# Patient Record
Sex: Female | Born: 1935 | Race: White | Hispanic: No | Smoking: Never smoker
Health system: Southern US, Community
[De-identification: ages and names within clinical notes are randomized; demographics above are authoritative.]

## PROBLEM LIST (undated history)

## (undated) DIAGNOSIS — I959 Hypotension, unspecified: Secondary | ICD-10-CM

## (undated) DIAGNOSIS — F32A Depression, unspecified: Secondary | ICD-10-CM

## (undated) DIAGNOSIS — E7800 Pure hypercholesterolemia, unspecified: Secondary | ICD-10-CM

## (undated) DIAGNOSIS — D649 Anemia, unspecified: Secondary | ICD-10-CM

## (undated) DIAGNOSIS — E785 Hyperlipidemia, unspecified: Secondary | ICD-10-CM

## (undated) DIAGNOSIS — M1990 Unspecified osteoarthritis, unspecified site: Secondary | ICD-10-CM

## (undated) DIAGNOSIS — E039 Hypothyroidism, unspecified: Secondary | ICD-10-CM

## (undated) DIAGNOSIS — M51369 Other intervertebral disc degeneration, lumbar region without mention of lumbar back pain or lower extremity pain: Secondary | ICD-10-CM

## (undated) DIAGNOSIS — I700 Atherosclerosis of aorta: Secondary | ICD-10-CM

## (undated) DIAGNOSIS — E538 Deficiency of other specified B group vitamins: Secondary | ICD-10-CM

## (undated) DIAGNOSIS — I10 Essential (primary) hypertension: Secondary | ICD-10-CM

## (undated) DIAGNOSIS — I499 Cardiac arrhythmia, unspecified: Secondary | ICD-10-CM

## (undated) DIAGNOSIS — R911 Solitary pulmonary nodule: Secondary | ICD-10-CM

## (undated) DIAGNOSIS — Z9889 Other specified postprocedural states: Secondary | ICD-10-CM

## (undated) DIAGNOSIS — E119 Type 2 diabetes mellitus without complications: Secondary | ICD-10-CM

## (undated) DIAGNOSIS — R011 Cardiac murmur, unspecified: Secondary | ICD-10-CM

## (undated) DIAGNOSIS — F329 Major depressive disorder, single episode, unspecified: Secondary | ICD-10-CM

## (undated) DIAGNOSIS — I2510 Atherosclerotic heart disease of native coronary artery without angina pectoris: Secondary | ICD-10-CM

## (undated) DIAGNOSIS — R3915 Urgency of urination: Secondary | ICD-10-CM

## (undated) DIAGNOSIS — K219 Gastro-esophageal reflux disease without esophagitis: Secondary | ICD-10-CM

## (undated) DIAGNOSIS — G4730 Sleep apnea, unspecified: Secondary | ICD-10-CM

## (undated) DIAGNOSIS — J189 Pneumonia, unspecified organism: Secondary | ICD-10-CM

## (undated) DIAGNOSIS — J45909 Unspecified asthma, uncomplicated: Secondary | ICD-10-CM

## (undated) DIAGNOSIS — M069 Rheumatoid arthritis, unspecified: Secondary | ICD-10-CM

## (undated) DIAGNOSIS — E11319 Type 2 diabetes mellitus with unspecified diabetic retinopathy without macular edema: Secondary | ICD-10-CM

## (undated) DIAGNOSIS — M5136 Other intervertebral disc degeneration, lumbar region: Secondary | ICD-10-CM

## (undated) DIAGNOSIS — A498 Other bacterial infections of unspecified site: Secondary | ICD-10-CM

## (undated) DIAGNOSIS — R112 Nausea with vomiting, unspecified: Secondary | ICD-10-CM

## (undated) DIAGNOSIS — M4800 Spinal stenosis, site unspecified: Secondary | ICD-10-CM

## (undated) HISTORY — PX: VARICOSE VEIN SURGERY: SHX832

## (undated) HISTORY — PX: KNEE ARTHROSCOPY W/ AUTOGENOUS CARTILAGE IMPLANTATION (ACI) PROCEDURE: SHX1865

## (undated) HISTORY — DX: Essential (primary) hypertension: I10

## (undated) HISTORY — PX: CYSTOSCOPY: SUR368

## (undated) HISTORY — PX: CATARACT EXTRACTION, BILATERAL: SHX1313

## (undated) HISTORY — DX: Major depressive disorder, single episode, unspecified: F32.9

## (undated) HISTORY — DX: Type 2 diabetes mellitus without complications: E11.9

## (undated) HISTORY — DX: Depression, unspecified: F32.A

## (undated) HISTORY — DX: Gastro-esophageal reflux disease without esophagitis: K21.9

## (undated) HISTORY — DX: Sleep apnea, unspecified: G47.30

## (undated) HISTORY — PX: JOINT REPLACEMENT: SHX530

## (undated) HISTORY — DX: Unspecified osteoarthritis, unspecified site: M19.90

## (undated) HISTORY — DX: Cardiac murmur, unspecified: R01.1

## (undated) HISTORY — DX: Hyperlipidemia, unspecified: E78.5

## (undated) HISTORY — PX: ABDOMINAL HYSTERECTOMY: SHX81

## (undated) HISTORY — PX: VEIN SURGERY: SHX48

---

## 2004-07-15 ENCOUNTER — Other Ambulatory Visit: Payer: Self-pay

## 2004-07-15 ENCOUNTER — Emergency Department: Payer: Self-pay | Admitting: Internal Medicine

## 2007-09-26 ENCOUNTER — Ambulatory Visit: Payer: Self-pay | Admitting: Gastroenterology

## 2008-09-29 ENCOUNTER — Emergency Department: Payer: Self-pay | Admitting: Internal Medicine

## 2010-03-04 ENCOUNTER — Ambulatory Visit: Payer: Self-pay | Admitting: Gastroenterology

## 2010-03-06 LAB — PATHOLOGY REPORT

## 2010-10-15 ENCOUNTER — Ambulatory Visit: Payer: Self-pay | Admitting: Family Medicine

## 2011-07-07 ENCOUNTER — Ambulatory Visit: Payer: Self-pay | Admitting: Internal Medicine

## 2011-08-27 ENCOUNTER — Other Ambulatory Visit: Payer: Self-pay | Admitting: Unknown Physician Specialty

## 2011-10-07 ENCOUNTER — Other Ambulatory Visit: Payer: Self-pay | Admitting: Unknown Physician Specialty

## 2011-10-20 ENCOUNTER — Ambulatory Visit: Payer: Self-pay | Admitting: Unknown Physician Specialty

## 2011-12-28 ENCOUNTER — Ambulatory Visit: Payer: Self-pay | Admitting: Family Medicine

## 2012-01-27 ENCOUNTER — Ambulatory Visit: Payer: Self-pay | Admitting: Family Medicine

## 2012-08-23 HISTORY — PX: TOTAL KNEE ARTHROPLASTY: SHX125

## 2013-02-08 DIAGNOSIS — M5416 Radiculopathy, lumbar region: Secondary | ICD-10-CM | POA: Insufficient documentation

## 2013-02-16 ENCOUNTER — Ambulatory Visit: Payer: Self-pay | Admitting: Family Medicine

## 2013-03-22 ENCOUNTER — Ambulatory Visit: Payer: Self-pay | Admitting: Family Medicine

## 2013-07-14 ENCOUNTER — Ambulatory Visit: Payer: Self-pay | Admitting: Family Medicine

## 2013-12-29 ENCOUNTER — Observation Stay: Payer: Self-pay

## 2013-12-29 LAB — COMPREHENSIVE METABOLIC PANEL
ALK PHOS: 131 U/L — AB
Albumin: 3.5 g/dL (ref 3.4–5.0)
Anion Gap: 4 — ABNORMAL LOW (ref 7–16)
BILIRUBIN TOTAL: 0.3 mg/dL (ref 0.2–1.0)
BUN: 18 mg/dL (ref 7–18)
CALCIUM: 9.2 mg/dL (ref 8.5–10.1)
CO2: 34 mmol/L — AB (ref 21–32)
Chloride: 101 mmol/L (ref 98–107)
Creatinine: 0.73 mg/dL (ref 0.60–1.30)
EGFR (African American): >60
EGFR (Non-African Amer.): >60
GLUCOSE: 154 mg/dL — AB (ref 65–99)
Osmolality: 283 (ref 275–301)
POTASSIUM: 3.6 mmol/L (ref 3.5–5.1)
SGOT(AST): 28 U/L (ref 15–37)
SGPT (ALT): 13 U/L (ref 12–78)
Sodium: 139 mmol/L (ref 136–145)
TOTAL PROTEIN: 7.4 g/dL (ref 6.4–8.2)

## 2013-12-29 LAB — URINALYSIS, COMPLETE
BACTERIA: NONE SEEN
Bilirubin,UR: NEGATIVE
Blood: NEGATIVE
Glucose,UR: NEGATIVE mg/dL (ref 0–75)
Ketone: NEGATIVE
Nitrite: NEGATIVE
Ph: 6 (ref 4.5–8.0)
Protein: NEGATIVE
Specific Gravity: 1.005 (ref 1.003–1.030)
Squamous Epithelial: 1
WBC UR: 3 /HPF (ref 0–5)

## 2013-12-29 LAB — CBC
HCT: 38.2 % (ref 35.0–47.0)
HGB: 12.1 g/dL (ref 12.0–16.0)
MCH: 29.3 pg (ref 26.0–34.0)
MCHC: 31.7 g/dL — AB (ref 32.0–36.0)
MCV: 92 fL (ref 80–100)
PLATELETS: 137 10*3/uL — AB (ref 150–440)
RBC: 4.14 10*6/uL (ref 3.80–5.20)
RDW: 14.8 % — ABNORMAL HIGH (ref 11.5–14.5)
WBC: 5.4 10*3/uL (ref 3.6–11.0)

## 2013-12-29 LAB — TROPONIN I: Troponin-I: <0.02 ng/mL

## 2013-12-29 LAB — CK TOTAL AND CKMB (NOT AT ARMC)
CK, TOTAL: 83 U/L
CK-MB: 1 ng/mL (ref 0.5–3.6)

## 2013-12-30 LAB — CBC WITH DIFFERENTIAL/PLATELET
Basophil #: 0 10*3/uL (ref 0.0–0.1)
Basophil %: 0.6 %
EOS ABS: 0.3 10*3/uL (ref 0.0–0.7)
Eosinophil %: 6.5 %
HCT: 37.4 % (ref 35.0–47.0)
HGB: 12.1 g/dL (ref 12.0–16.0)
Lymphocyte #: 1.4 10*3/uL (ref 1.0–3.6)
Lymphocyte %: 30.9 %
MCH: 29.6 pg (ref 26.0–34.0)
MCHC: 32.2 g/dL (ref 32.0–36.0)
MCV: 92 fL (ref 80–100)
MONO ABS: 0.4 x10 3/mm (ref 0.2–0.9)
MONOS PCT: 9.4 %
NEUTROS ABS: 2.4 10*3/uL (ref 1.4–6.5)
Neutrophil %: 52.6 %
PLATELETS: 129 10*3/uL — AB (ref 150–440)
RBC: 4.07 10*6/uL (ref 3.80–5.20)
RDW: 15.1 % — ABNORMAL HIGH (ref 11.5–14.5)
WBC: 4.6 10*3/uL (ref 3.6–11.0)

## 2013-12-30 LAB — LIPID PANEL
Cholesterol: 159 mg/dL (ref 0–200)
HDL Cholesterol: 42 mg/dL (ref 40–60)
Ldl Cholesterol, Calc: 90 mg/dL (ref 0–100)
TRIGLYCERIDES: 137 mg/dL (ref 0–200)
VLDL Cholesterol, Calc: 27 mg/dL (ref 5–40)

## 2013-12-30 LAB — BASIC METABOLIC PANEL
ANION GAP: 4 — AB (ref 7–16)
BUN: 11 mg/dL (ref 7–18)
CO2: 31 mmol/L (ref 21–32)
Calcium, Total: 8.6 mg/dL (ref 8.5–10.1)
Chloride: 105 mmol/L (ref 98–107)
Creatinine: 0.7 mg/dL (ref 0.60–1.30)
EGFR (Non-African Amer.): >60
Glucose: 121 mg/dL — ABNORMAL HIGH (ref 65–99)
Osmolality: 280 (ref 275–301)
POTASSIUM: 3.6 mmol/L (ref 3.5–5.1)
Sodium: 140 mmol/L (ref 136–145)

## 2014-06-20 ENCOUNTER — Ambulatory Visit: Payer: Self-pay | Admitting: Family Medicine

## 2014-06-28 DIAGNOSIS — N3946 Mixed incontinence: Secondary | ICD-10-CM | POA: Insufficient documentation

## 2014-11-14 ENCOUNTER — Ambulatory Visit: Payer: Self-pay | Admitting: Family Medicine

## 2014-12-14 NOTE — Consult Note (Signed)
Present Illness The patient is a 80 year old female with a history of hypertension who presented today to the Emergency Department two days ago status post fall. She had been in her usual state of health until 2 days prior to admission. She had a fall at home and recalls she fell onto her right side, suffered no injury, suffered no loss of consciousness, was able to get up and resume normal activities and then a second episode occured, she was feeling dizzy. Subsequently during the night, she recalls going into the kitchen and then had loss of consciousness. It is not known how long she had the loss of consciousness. She tells me her sisters found her in the kitchen on the ground and called EMS. She awoke with EMS around her. She does not recall having any preceding chest pain or palpitations. She was then brought to the ED. Her initial vital signs were normal. An EKG showed sinus bradycardia with a rate of 55, but otherwise no acute changes. She underwent CT of the head which showed no acute intracranial abnormality, however, likely remote small lacunar infarcts were seen. Additionally, she underwent a CT head and neck with angiography, which showed irregular stenosis of 70% at the right internal carotid artery and 30 to 40% stenosis at the left external carotid artery. Imaging of the head showed no occlusion. There was atheromatous disease within cavernous segments of the internal carotid arteries bilaterally.   She specifically denies any episodes of right sided AF no symptoms consistent with right hemispheric TIA's She denies any knowledge of prior stroke  PAST MEDICAL HISTORY:  1.  Hypertension.  2.  Diabetes mellitus.  3.  Peptic ulcer disease.  4.  Seasonal allergies.  5.  Depression/anxiety.  6.  Hypothyroidism.  7.  A history of 7-mm right middle lobe pulmonary nodule, left image 09/2013.  8.  Chronic back pain attributed to spinal stenosis.   PAST SURGICAL HISTORY:  1.  Left vein surgery.   2.  Hysterectomy.  3.  Right knee replacement.   Home Medications: Medication Instructions Status  Aspir 81 81 mg oral tablet 1 tab(s) orally once a day Active  carvedilol 6.25 mg oral tablet 1 tab(s) orally 2 times a day Active  Neurontin 300 mg oral capsule  orally once daily as needed Active  Ambien 10 mg oral tablet 1 tab(s) orally once a day (at bedtime) as needed Active  amLODIPine 10 mg oral tablet 1 tab(s) orally once a day Active  cyanocobalamin 1000 mcg oral tablet 1 tab(s) orally once a day Active  hydrochlorothiazide 25 mg oral tablet 1 tab(s) orally once a day Active  levothyroxine 75 mcg (0.075 mg) oral tablet 1 tab(s) orally once a day Active  losartan 100 mg oral tablet 1 tab(s) orally once a day Active  omeprazole 20 mg oral delayed release capsule 1 cap(s) orally once a day Active  PARoxetine 30 mg oral tablet 1 tab(s) orally once a day Active  rosuvastatin 5 mg oral tablet 1 tab(s) orally once a day (at bedtime) Active  vitamin E 400 intl units oral capsule 1 cap(s) orally once a day Active    Sulfa drugs: Rash  Keflex: Rash  Case History:  Family History Non-Contributory   Social History negative tobacco, negative ETOH, negative Illicit drugs   Review of Systems:  Fever/Chills No   Cough No   Sputum No   Abdominal Pain No   Diarrhea No   Constipation No   Nausea/Vomiting No  SOB/DOE No   Chest Pain No   Telemetry Reviewed NSR   Dysuria No   Physical Exam:  GEN well developed, no acute distress   HEENT PERRL, hearing intact to voice, moist oral mucosa   NECK supple  trachea midline   RESP normal resp effort  no use of accessory muscles   CARD regular rate  LE edema present  no JVD   ABD denies tenderness  soft   EXTR negative cyanosis/clubbing, positive edema, mild pedal edema left >right, scattered diffuse varicosities again Lt>Rt   SKIN No rashes, No ulcers   NEURO cranial nerves intact, follows commands, motor/sensory  function intact   PSYCH alert, good insight   Nursing/Ancillary Notes: **Vital Signs.:   10-May-15 12:04  Vital Signs Type Routine  Temperature Temperature (F) 98.2  Celsius 36.7  Pulse Pulse 53  Respirations Respirations 20  Systolic BP Systolic BP 130  Diastolic BP (mmHg) Diastolic BP (mmHg) 63  Mean BP 91  Pulse Ox % Pulse Ox % 94  Pulse Ox Activity Level  At rest  Oxygen Delivery Room Air/ 21 %   Hepatic:  09-May-15 00:18   Bilirubin, Total 0.3  Alkaline Phosphatase  131 (45-117 NOTE: New Reference Range 07/13/13)  SGPT (ALT) 13  SGOT (AST) 28  Total Protein, Serum 7.4  Albumin, Serum 3.5  Routine Chem:  09-May-15 00:18   Glucose, Serum  154  BUN 18  Creatinine (comp) 0.73  Sodium, Serum 139  Potassium, Serum 3.6  Chloride, Serum 101  CO2, Serum  34  Calcium (Total), Serum 9.2  Anion Gap  4  Osmolality (calc) 283  eGFR (African American) >60  eGFR (Non-African American) >60 (eGFR values <11m/min/1.73 m2 may be an indication of chronic kidney disease (CKD). Calculated eGFR is useful in patients with stable renal function. The eGFR calculation will not be reliable in acutely ill patients when serum creatinine is changing rapidly. It is not useful in  patients on dialysis. The eGFR calculation may not be applicable to patients at the low and high extremes of body sizes, pregnant women, and vegetarians.)  10-May-15 05:54   Glucose, Serum  121  BUN 11  Creatinine (comp) 0.70  Sodium, Serum 140  Potassium, Serum 3.6  Chloride, Serum 105  CO2, Serum 31  Calcium (Total), Serum 8.6  Anion Gap  4  Osmolality (calc) 280  eGFR (African American) >60  eGFR (Non-African American) >60 (eGFR values <674mmin/1.73 m2 may be an indication of chronic kidney disease (CKD). Calculated eGFR is useful in patients with stable renal function. The eGFR calculation will not be reliable in acutely ill patients when serum creatinine is changing rapidly. It is not useful in   patients on dialysis. The eGFR calculation may not be applicable to patients at the low and high extremes of body sizes, pregnant women, and vegetarians.)  Cholesterol, Serum 159  Triglycerides, Serum 137  HDL (INHOUSE) 42  VLDL Cholesterol Calculated 27  LDL Cholesterol Calculated 90 (Result(s) reported on 30 Dec 2013 at 06:38AM.)  Cardiac:  09-May-15 00:18   CK, Total 83 (26-192 NOTE: NEW REFERENCE RANGE  09/24/2013)  CPK-MB, Serum 1.0 (Result(s) reported on 29 Dec 2013 at 01:56AM.)  Troponin I < 0.02 (0.00-0.05 0.05 ng/mL or less: NEGATIVE  Repeat testing in 3-6 hrs  if clinically indicated. >0.05 ng/mL: POTENTIAL  MYOCARDIAL INJURY. Repeat  testing in 3-6 hrs if  clinically indicated. NOTE: An increase or decrease  of 30% or more on serial  testing suggests a  clinically important change)  Routine UA:  09-May-15 01:19   Color (UA) Straw  Clarity (UA) Clear  Glucose (UA) Negative  Bilirubin (UA) Negative  Ketones (UA) Negative  Specific Gravity (UA) 1.005  Blood (UA) Negative  pH (UA) 6.0  Protein (UA) Negative  Nitrite (UA) Negative  Leukocyte Esterase (UA) 1+ (Result(s) reported on 29 Dec 2013 at 01:35AM.)  RBC (UA) <1 /HPF  WBC (UA) 3 /HPF  Bacteria (UA) NONE SEEN  Epithelial Cells (UA) 1 /HPF (Result(s) reported on 29 Dec 2013 at 01:35AM.)  Routine Hem:  09-May-15 00:18   WBC (CBC) 5.4  RBC (CBC) 4.14  Hemoglobin (CBC) 12.1  Hematocrit (CBC) 38.2  Platelet Count (CBC)  137 (Result(s) reported on 29 Dec 2013 at 02:02AM.)  MCV 92  MCH 29.3  MCHC  31.7  RDW  14.8  10-May-15 05:54   WBC (CBC) 4.6  RBC (CBC) 4.07  Hemoglobin (CBC) 12.1  Hematocrit (CBC) 37.4  Platelet Count (CBC)  129  MCV 92  MCH 29.6  MCHC 32.2  RDW  15.1  Neutrophil % 52.6  Lymphocyte % 30.9  Monocyte % 9.4  Eosinophil % 6.5  Basophil % 0.6  Neutrophil # 2.4  Lymphocyte # 1.4  Monocyte # 0.4  Eosinophil # 0.3  Basophil # 0.0 (Result(s) reported on 30 Dec 2013 at  06:35AM.)   CT:    09-May-15 04:16, CT Angiography Head/Neck W/WO Combo  CT Angiography Head/Neck W/WO Combo   REASON FOR EXAM:    dizziness and fall  COMMENTS:       PROCEDURE: CT  - CT ANGIOGRAPHY HEAD NECK W/WO  - Dec 29 2013  4:16AM     CLINICAL DATA:  Dizziness, fall    EXAM:  CT ANGIOGRAPHY HEAD AND NECK    TECHNIQUE:  Multidetector CT imaging of the head and neck was performed using  the standard protocol during bolus administration of intravenous  contrast. Multiplanar CT image reconstructions and MIPs were  obtained to evaluate the vascular anatomy. Carotid stenosis  measurements (when applicable) are obtained utilizing NASCET  criteria, using the distal internal carotid diameter as the  denominator.    CONTRAST:  100 cc of Isovue 370    COMPARISON:  Prior noncontrast head CT performed earlier on the same  day.    FINDINGS:  CTA HEAD FINDINGS    The petrous, cavernous, and supra clinoid segments of the internal  carotid arteries are well opacified bilaterally and of normal  caliber. No high-grade flow-limiting stenosis. Mild calcified plaque  seen within the cavernous segments of the carotid arteries  bilaterally. A1 segments are well opacified and normal. Anterior  communicating artery are unremarkable. Anterior cerebral arteries  are well opacified.    The middle cerebral arteries are well opacified without evidence of  high-grade flow-limiting stenosis or proximal branch occlusion.  Distal MCA branches are well opacified bilaterally.    The vertebral arteries are codominant. Posterior inferior cerebral  arteries are normal bilaterally. Vertebrobasilar junction and  basilar artery are within normal limits. Subcentimeter calcification  noted anterior to the in distal basilar artery, seen on prior  noncontrast head CT is well.  Posterior cerebral arteries are well opacified bilaterally. Fetal  origin of the new right PCA noted. Small left  posterior  communicating artery. Superior cerebellar arteries are unremarkable.    No intracranial aneurysm, high-grade flow-limiting stenosis, or  other abnormality identified within the intracranial circulation.    Review of the MIP images confirms the above  findings.    Atrophy with small lacunar infarcts again noted.    CTA NECK FINDINGS    The visualized aortic arch is of normal caliber. Mild atheromatous  disease present within the aortic arch. No high-grade stenosis seen  at the origin of the great vessels. The subclavian arteries are  widely patent bilaterally.    The common carotid arteries are mildly tortuous but widely patent  without evidence of occlusion or high-grade flow-limiting stenosis.  Carotid bifurcations are normal.    There is irregular calcified and noncalcified plaque at the origin  of the right internal carotid artery with associated in 70% of  stenosis (series 5, image 140). This stenosis measures approximately  1.3 cm in length. The right internal carotid artery is well  opacified distally to the level of the skullbase.    The left internal carotid artery is well opacified without evidence  of high-grade flow-limiting stenosis, dissection, or occlusion.  Atherosclerotic calcification with irregular stenosis of  approximately 30-40% present with the origin of the left external  carotid artery (series 5, image 150). External carotid arteries and  their branches are otherwise unremarkable.    The vertebral arteries both arise from the subclavian arteries.  Vertebral arteries are well opacified along their entire course  without evidence of dissection, high-grade flow-limiting stenosis,  or occlusion. The vertebral arteries are codominant.    Visualized lungs are clear. No soft tissue abnormality identified  within the neck.    Multilevel degenerative changes noted within the visualized spine,  most severe at C6-7. No acute osseous abnormality.    IMPRESSION:  CTA HEAD:    1. No proximal branch occlusion or high-grade flow-limiting stenosis  identified within the intracranial circulation.  2. On calcified atheromatous disease within the cavernous segments  of the internal carotid arteries bilaterally.  3. Fetal origin of the right posterior cerebral artery.  CTA NECK:    1. Irregular stenosis of approximately 70% and measuring 1.3 cm in  length at the origin of the right internal carotid artery.  2. Calcified plaque at the origin of the left external carotid  artery with associated 30-40% of stenosis by NASCET criteria.  3. No other abnormality within the major arterial vasculature of the  neck.      Electronically Signed    By: Jeannine Boga M.D.    On: 12/29/2013 05:26         Verified By: Neomia Glass, M.D.,    Impression 1.  Carotid stenosis         I have personally reviewed the CTangio and I do not feel that the stenosis on the right even reaches 70% I waould favor more 50-60% as the degree of stenosis.  Left side is more minimal.  Vertebral arteries are patent.  Therefore,  this pattern does not support a global symptom such as syncope.  She denies more focal symptoms at this time.  She is taking a EC ASA 81 mg daily and I concur. I would favor obtaining a duplex ultrasound which can be done as an out patient to get a better sense of the stenosis of the right.  I do not feel she need intervention or surgery at this time. 2.  Syncope           suspicious that this is cardiac even after all the excitement of EMS and being brought to the ER she still only had a heart rate of 55. 3.  Hypertension.  Home meds and changes per Medical service  4.  Diabetes mellitus.              Acu checks AC              medications per medical service  5.  Peptic ulcer disease.               continue PPI 6.  Hypothyroidism.              continue synthroid daily replacement   Plan level 4 consult    Electronic Signatures: Hortencia Pilar (MD)  (Signed 10-May-15 12:23)  Authored: General Aspect/Present Illness, Home Medications, Allergies, History and Physical Exam, Vital Signs, Labs, Radiology, Impression/Plan   Last Updated: 10-May-15 12:23 by Hortencia Pilar (MD)

## 2014-12-14 NOTE — Discharge Summary (Signed)
PATIENT NAME:  Mary [name], Mary [name] MR#:  [id number] DATE OF BIRTH:  08/30/1934  DATE OF ADMISSION:  12/29/2013 DATE OF DISCHARGE:  12/30/2013  PRIMARY CARE PROVIDER: Dion Body, MD  CONSULTANT: Mary Koyanagi, MD  DISCHARGE DIAGNOSES:  1. Syncope.  2. Cerebrovascular disease. 3. Bradycardia.   DISCHARGE MEDICATIONS:  1. Amlodipine 10 mg once daily.  2. B12 at 1000 mcg daily.  3. Hydrochlorothiazide 25 mg daily.  4. Levothyroxine 75 mcg daily.  5. Losartan 100 mg daily.  6. Omeprazole 20 mg daily.  7. Paroxetine 30 mg daily.  8. Rosuvastatin 5 mg daily.  9. Vitamin E 400 units daily.  10. Aspirin 81 mg daily.  11. Carvedilol 6.25 mg b.i.d.  12. Neurontin 300 mg at bedtime as needed.  13. Ambien 10 mg at bedtime as needed.   HISTORY AND PHYSICAL: This is a 80 year old female with a history of hypertension, diabetes mellitus, hypothyroidism, who presented to the ED after recurrent falls. She had had a fall 2 days prior to admission and suffered no injury. The day prior to admission she was having dizziness and on the evening of admission, she fell and had loss of consciousness with injury to the back of her head. EMS brought her to the ED. In the ED, a head CT showed no acute abnormalities. EKG showed sinus rhythm, bradycardia with heart rate in the 50s. A CT angiography of the head and neck showed irregular stenosis of the right internal carotid 70% and 30 %-40% at the left external carotid.   HOSPITAL COURSE: The patient was admitted and placed on telemetry. She maintained sinus bradycardia in the 50s. She had no further events. She was able to get up and use the facilities in her room without any further dizziness. Her outpatient medications were all continued except the metformin, which was held due to exposure to contrast dye. Dr. Ronalee [name], vascular surgery, was consulted. He reviewed the imaging studies. He felt her left carotids were clear and had question whether there was really  as significant stenosis on the right as reported on the CTA. He recommended continuing her outpatient aspirin as well as her current dose of her statin and recommended an outpatient carotid ultrasound be pursued.   SIGNIFICANT LABORATORIES: Lipid levels on the day of discharge included a total cholesterol 159, triglycerides 137, HDL 42, and LDL cholesterol of 90. Glucose was 121, creatinine 0.7, potassium 3.6, hemoglobin 12.1, hematocrit 37.4, platelets 129, 000. WBC 4.6.   DISCHARGE INSTRUCTIONS:  1. The patient is to hold metformin for an additional 2 days after discharge given recent exposure to iodinated contrast.  2. Her dose of carvedilol was reduced from 1/2 tab of 25 mg b.i.d. to 6.25 mg b.i.d. given the persistent bradycardia. Bradycardia could be contributing to her dizziness and reported syncopal episode.  3. Patient will follow up with Dr. Ronalee [name] in 1 to 2 weeks. She is to call the clinic for that appointment.  4. The patient is to follow up with Dr. Netty [name] in 1-2 weeks. The patient is to call the clinic to schedule that appointment as well.    ____________________________ A. Lavone Orn, MD ams:lt D: 12/30/2013 11:46:03 ET T: 12/30/2013 23:57:14 ET JOB#: [id number]  cc: A. Lavone Orn, MD, <Dictator> Mary Body, MD Mary Cabal, MD Mary Handing MD ELECTRONICALLY SIGNED 01/06/2014 11:52

## 2014-12-14 NOTE — H&P (Signed)
PATIENT NAME:  Mary [name], Mary [name] MR#:  [id number] DATE OF BIRTH:  09/23/1934  REFERRING PROVIDER:  Charlesetta Ivory, M.D.   CONSULTING PHYSICIAN:  A. Lavone Orn, M.D.   CHIEF COMPLAINT:  Status post fall.  HISTORY OF PRESENT ILLNESS:  This is a 80 year old female with a history of hypertension who presented today to the Emergency Department status post fall. She had been in her usual state of health until 2 days prior to admission. She had a fall at home and recalls she fell onto her right side, suffered no injury, suffered no loss of consciousness, was able to get up and resume normal activities and then yesterday morning after breakfast, she was feeling dizzy but otherwise able to do normal activities. Overnight, last night, she got up to go to the bathroom. She recalls going into the kitchen and then had loss of consciousness. It is not known how long she had the loss of consciousness. She tells me her sisters found her in the kitchen on the ground and called EMS. She awoke with EMS around her. She does not recall having any preceding chest pain or palpitations. She was then brought to the ED. Her initial vital signs were normal. An EKG showed sinus bradycardia with a rate of 55, but otherwise no acute changes. She underwent CT of the head which showed no acute intracranial abnormality, however, likely remote small lacunar infarcts were seen. Additionally, she underwent a CT head and neck with angiography, which showed irregular stenosis of 70% at the right internal carotid artery and 30 to 40% stenosis at the left external carotid artery. Imaging of the head showed no occlusion. There was atheromatous disease within cavernous segments of the internal carotid arteries bilaterally.   PAST MEDICAL HISTORY:  1.  Hypertension.  2.  Diabetes mellitus.  3.  Peptic ulcer disease.  4.  Seasonal allergies.  5.  Depression/anxiety.  6.  Hypothyroidism.  7.  A history of 7-mm right middle lobe pulmonary  nodule, left image 09/2013.  8.  Chronic back pain attributed to spinal stenosis.   PAST SURGICAL HISTORY:  1.  Left vein surgery.  2.  Hysterectomy.  3.  Right knee replacement.   MEDICATIONS: 1.  Cyclobenzaprine 10 mg at bedtime as needed.  2.  Norvasc 10 mg daily.  3.  Carvedilol 25 mg 1/2 tab b.i.d.  4.  Levothyroxine 75 mcg daily.  5.  Aspirin 81 mg daily.  6.  Omeprazole 40 mg daily.  7.  Vitamin E 400 units daily.  8.  Fluoxetine 40 mg at bedtime.  9.  Metformin extended release 500 mg daily.  10.  Vitamin B12 1000 mcg daily.  10.  Lovastatin 20 mg at bedtime. 11.  Ambien 10 mg 1/2 to 1 tab at bedtime.   ALLERGIES:  ATORVASTATIN, SULFA, KEFLEX, MACRODANTIN.   FAMILY HISTORY:  Positive for heart disease and renal disease.   MARITAL HISTORY:  The patient is married. No tobacco use. No alcohol use.   REVIEW OF SYSTEMS:  GENERAL:  Denies weight change. Denies fever.  HEENT:  Reports some tenderness on the posterior scalp at the site of her fall. Denies blurred vision.  NECK:  Denies neck pain or dysphagia.  CARDIAC:  Denies chest pain or palpitation.  PULMONARY:  Reports dyspnea on exertion, chronic. Denies cough.  ABDOMEN:  Denies nausea, vomiting. Denies abdominal pain.  EXTREMITIES:  Denies leg swelling at this time; however, reports she has had left ankle swelling at times.  MUSCULOSKELETAL:  Reports tenderness at her lower sacrum at the site of her fall.  ENDOCRINE:  Denies heat or cold intolerance.  HEMATOLOGIC:  Denies easy bruisability or recent bleeding.   PHYSICAL EXAMINATION:  VITAL SIGNS:  Height 60.9 inches, weight 177 pounds, BMI 33.5, blood pressure 194/74, respirations 18, pulse ox 94% on room air, pulse 64.  GENERAL:  Well-developed white female in no acute distress.  HEENT:  EOMI. Oropharynx is clear.  NECK:  Supple. No thyromegaly.  CARDIAC:  Regular rate and rhythm. No audible murmur. No audible carotid bruit.  ABDOMEN:  Soft, nontender and  nondistended.  PULMONARY:  Clear to auscultation bilaterally. No rales or wheeze. Good inspiratory effort.  EXTREMITIES:  No peripheral edema is present.  NEUROLOGIC:  Cranial nerves II through XII intact.  MUSCULOSKELETAL:  Strength 5/5 bilateral upper and lower extremities.  SKIN:  No dermatopathy or rash noted. No bruising at lower back or on occiput. No lesions noted.   LABORATORY DATA:  Glucose 154, BUN 18, creatinine 0.7, sodium 139, potassium 3.6, bicarb 34, calcium 9.2, albumin 3.5, alkaline phosphatase 131, AST 28, ALT 13. CK 83. WBC 5.4, hematocrit 38.2. Urinalysis essentially negative.   EKG shows sinus bradycardia, rate 55, no acute ST-T wave changes.   RADIOLOGY:  1.  CT head without contrast shows no acute intracranial abnormality. Mild atrophy. Small lacunar infarcts, likely remote.  2.  CT of angiography head and neck with and without contrast shows:  a.  No proximal branch occlusion or high-grade flow-limiting stenosis identified within intracranial circulation. Calcified atheromatous disease within cavernous segments of the internal carotids bilaterally. Fetal origin of the right posterior cerebral artery noted. Irregular stenosis of approximately 70% and measuring 1.3 cm in length at origin of right internal carotid artery. Calcified plaque at origin of left external carotid artery with associated 30% to 40% stenosis. No other major abnormalities noted.   ASSESSMENT:  This is a 80 year old female with hypertension who presented after several falls and dizziness, found to have vascular disease with 70% stenosis of the right internal carotid artery on angiography.   RECOMMENDATIONS:  1.  We will obtain morning lipids. Recommend LDL less than 70.  2.  Mild permissive hypertension at this time. We will follow. Likely will need to continue her outpatient antihypertensives.  3.  Anticipate obtaining a vascular to evaluate the carotid artery disease.  4.  Continue aspirin.  5.   Monitor on telemetry.  6.  Continue outpatient medications for underlying chronic condition. 7.  Prophylaxis to include subcutaneous heparin for deep vein thrombosis prophylaxis.   CODE STATUS:  FULL CAREFUL CODE.   ____________________________ A. Lavone Orn, MD ams:jm D: 12/29/2013 09:08:04 ET T: 12/29/2013 13:18:04 ET JOB#: [id number]  cc: A. Lavone Orn, MD, <Dictator> Dion Body, MD Sherlon Handing MD ELECTRONICALLY SIGNED 01/02/2014 15:34

## 2015-04-29 ENCOUNTER — Encounter: Payer: Self-pay | Admitting: Urology

## 2015-04-29 ENCOUNTER — Ambulatory Visit (INDEPENDENT_AMBULATORY_CARE_PROVIDER_SITE_OTHER): Payer: Medicare PPO | Admitting: Urology

## 2015-04-29 VITALS — BP 164/69 | HR 61 | Ht 65.0 in | Wt 196.4 lb

## 2015-04-29 DIAGNOSIS — R1030 Lower abdominal pain, unspecified: Secondary | ICD-10-CM | POA: Diagnosis not present

## 2015-04-29 DIAGNOSIS — N952 Postmenopausal atrophic vaginitis: Secondary | ICD-10-CM

## 2015-04-29 DIAGNOSIS — R350 Frequency of micturition: Secondary | ICD-10-CM | POA: Insufficient documentation

## 2015-04-29 DIAGNOSIS — R351 Nocturia: Secondary | ICD-10-CM | POA: Diagnosis not present

## 2015-04-29 DIAGNOSIS — R102 Pelvic and perineal pain: Secondary | ICD-10-CM | POA: Insufficient documentation

## 2015-04-29 LAB — URINALYSIS, COMPLETE
Bilirubin, UA: POSITIVE — AB
Glucose, UA: NEGATIVE
Nitrite, UA: NEGATIVE
PH UA: 6 (ref 5.0–7.5)
PROTEIN UA: NEGATIVE
RBC, UA: NEGATIVE
Specific Gravity, UA: 1.02 (ref 1.005–1.030)
UUROB: 1 mg/dL (ref 0.2–1.0)

## 2015-04-29 LAB — MICROSCOPIC EXAMINATION

## 2015-04-29 LAB — BLADDER SCAN AMB NON-IMAGING: SCAN RESULT: 0

## 2015-04-29 NOTE — Progress Notes (Signed)
04/29/2015 1:05 PM   Mary [name] 01/07/1935 [id number]  Referring provider: No referring provider defined for this encounter.  Chief Complaint  Patient presents with  . SupraPubic pain    self refferal  pain times 6 months     HPI: Patient is a 80 year old white female who presents today for suprapubic pain that started one month ago.  She has not had any episodes of gross hematuria or UTI's associated with this pain.  Her baseline urinary symptoms are frequency, urgency, dysuria, nocturia x 5, leakage of urine during the night and upon wakening.  These symptoms have been present for one year.    She has a known rectocele, which she believes is irritating her bladder.  She was evaluated at [facility] gynecology one year ago, but was told surgery for the rectocele would not help her.  I do not have those records at this time.    Her biggest complaint is the nocturia x 5.  It is also associated with incontinence.  She has to wear depends and changes them through the night.  She has leakage as "soon as my feet hit the floor."  During the day, she is not having as much leakage because she can get to the bathroom on time.    She has been evaluated in the past for hematuria by Dr. Yves Dill and Dr. Madelin Headings with cystoscopies.  She states they never found anything.    She had an UA and UCx on 04/04/2015 and both were negative.     PMH: Past Medical History  Diagnosis Date  . Diabetes   . Acid reflux   . Arthritis   . Depression   . Heart murmur   . HTN (hypertension)   . HLD (hyperlipidemia)   . Sleep apnea     Surgical History: Past Surgical History  Procedure Laterality Date  . Abdominal hysterectomy    . Varicose vein surgery    . Cystoscopy      Home Medications:    Medication List       This list is accurate as of: 04/29/15 11:59 PM.  Always use your most recent med list.               amLODipine 10 MG tablet  Commonly known as:  NORVASC  Take by mouth.     aspirin 81 MG tablet  Take by mouth.     furosemide 20 MG tablet  Commonly known as:  LASIX  Take by mouth.     hydrochlorothiazide 12.5 MG tablet  Commonly known as:  HYDRODIURIL  Take by mouth.     levothyroxine 75 MCG tablet  Commonly known as:  SYNTHROID, LEVOTHROID  Take by mouth.     losartan 100 MG tablet  Commonly known as:  COZAAR  Take by mouth.     lovastatin 40 MG tablet  Commonly known as:  MEVACOR  Take by mouth.     metFORMIN 500 MG 24 hr tablet  Commonly known as:  GLUCOPHAGE-XR  Take by mouth.     omeprazole 40 MG capsule  Commonly known as:  PRILOSEC  Take by mouth.     PARoxetine 40 MG tablet  Commonly known as:  PAXIL  Take by mouth.     RA VITAMIN B-12 TR 1000 MCG Tbcr  Generic drug:  Cyanocobalamin  1 tab daily     traZODone 50 MG tablet  Commonly known as:  DESYREL  Take by mouth.  vitamin E 400 UNIT capsule  Take by mouth.        Allergies:  Allergies  Allergen Reactions  . Cephalexin Hives  . Nitrofurantoin     Other reaction(s): Other (See Comments) Other Reaction: measles-like lesions  . Other     Other reaction(s): Other (See Comments) Uncoded Allergy. Allergen: microdantin, Other Reaction: Measle-like Lesions  . Sulfa Antibiotics Hives  . Atorvastatin Other (See Comments)    Family History: Family History  Problem Relation Age of Onset  . Kidney disease Brother     also nephew  . Prostate cancer Neg Hx   . Bladder Cancer Neg Hx     Social History:  reports that she has never smoked. She does not have any smokeless tobacco history on file. She reports that she does not drink alcohol or use illicit drugs.  ROS: UROLOGY Frequent Urination?: Yes Hard to postpone urination?: Yes Burning/pain with urination?: Yes Get up at night to urinate?: Yes Leakage of urine?: Yes Urine stream starts and stops?: No Trouble starting stream?: No Do you have to strain to urinate?: No Blood in urine?: No Urinary tract  infection?: No Sexually transmitted disease?: No Injury to kidneys or bladder?: No Painful intercourse?: No Weak stream?: No Currently pregnant?: No Vaginal bleeding?: No Last menstrual period?: n  Gastrointestinal Nausea?: No Vomiting?: No Indigestion/heartburn?: Yes Diarrhea?: No Constipation?: No  Constitutional Fever: No Night sweats?: No Weight loss?: No Fatigue?: Yes  Skin Skin rash/lesions?: No Itching?: No  Eyes Blurred vision?: Yes Double vision?: No  Ears/Nose/Throat Sore throat?: No Sinus problems?: Yes  Hematologic/Lymphatic Swollen glands?: No Easy bruising?: Yes  Cardiovascular Leg swelling?: Yes Chest pain?: No  Respiratory Cough?: No Shortness of breath?: Yes  Endocrine Excessive thirst?: Yes  Musculoskeletal Back pain?: Yes Joint pain?: Yes  Neurological Headaches?: No Dizziness?: No  Psychologic Depression?: Yes Anxiety?: No  Physical Exam: BP 164/69 mmHg  [name] 63  Ht 5\' 5"  (1.651 m)  Wt 196 lb 6.4 oz (89.086 kg)  BMI 32.68 kg/m2  Constitutional:  Alert and oriented, No acute distress. HEENT: [place] AT, moist mucus membranes.  Trachea midline, no masses. Cardiovascular: No clubbing, cyanosis, or edema. Respiratory: Normal respiratory effort, no increased work of breathing. GI: Abdomen is soft, nontender, nondistended, no abdominal masses GU: No CVA tenderness. Atrophic external genitalia.  Normal urethral meatus. No urethral masses and/or tenderness. No bladder fullness or masses. No vaginal lesions or discharge. Rectocele is noted, no masses. Normal anus and perineum.  Skin: No rashes, bruises or suspicious lesions. Lymph: No cervical or inguinal adenopathy. Neurologic: Grossly intact, no focal deficits, moving all 4 extremities. Psychiatric: Normal mood and affect.  Laboratory Data: Urinalysis: Results for orders placed or performed in visit on 04/29/15  Microscopic Examination  Result Value Ref Range   WBC, UA 0-5 0  -  5 /hpf   RBC, UA 0-2 0 -  2 /hpf   Epithelial Cells (non renal) 0-10 0 - 10 /hpf   Mucus, UA Present (A) Not Estab.   Bacteria, UA Few (A) None seen/Few  Urinalysis, Complete  Result Value Ref Range   Specific Gravity, UA 1.020 1.005 - 1.030   pH, UA 6.0 5.0 - 7.5   Color, UA Yellow Yellow   Appearance Ur Clear Clear   Leukocytes, UA Trace (A) Negative   Protein, UA Negative Negative/Trace   Glucose, UA Negative Negative   Ketones, UA Trace (A) Negative   RBC, UA Negative Negative   Bilirubin, UA Positive (A)  Negative   Urobilinogen, Ur 1.0 0.2 - 1.0 mg/dL   Nitrite, UA Negative Negative   Microscopic Examination See below:   BLADDER SCAN AMB NON-IMAGING  Result Value Ref Range   Scan Result 0      Pertinent Imaging: Results for orders placed or performed in visit on 04/29/15  Microscopic Examination  Result Value Ref Range   WBC, UA 0-5 0 -  5 /hpf   RBC, UA 0-2 0 -  2 /hpf   Epithelial Cells (non renal) 0-10 0 - 10 /hpf   Mucus, UA Present (A) Not Estab.   Bacteria, UA Few (A) None seen/Few  Urinalysis, Complete  Result Value Ref Range   Specific Gravity, UA 1.020 1.005 - 1.030   pH, UA 6.0 5.0 - 7.5   Color, UA Yellow Yellow   Appearance Ur Clear Clear   Leukocytes, UA Trace (A) Negative   Protein, UA Negative Negative/Trace   Glucose, UA Negative Negative   Ketones, UA Trace (A) Negative   RBC, UA Negative Negative   Bilirubin, UA Positive (A) Negative   Urobilinogen, Ur 1.0 0.2 - 1.0 mg/dL   Nitrite, UA Negative Negative   Microscopic Examination See below:   BLADDER SCAN AMB NON-IMAGING  Result Value Ref Range   Scan Result 0     Assessment & Plan:    1. Suprapubic pain, unspecified laterality:   Patient complains of the sudden onset of suprapubic pain starting one month ago.  Her PVR is 0 mL. Her UA today is unremarkable.  If she does not experiencing relief from the Vesicare or Premarin cream, I will have her undergo cystoscopy to rule out a  cancerous process.    - Urinalysis, Complete - BLADDER SCAN AMB NON-IMAGING  2. Nocturia:  I explained to the patient that nocturia is often multi-factorial and difficult to treat.  Sleeping disorders, heart conditions and peripheral vascular disease, diabetes,  enlarged prostate or urethral stricture causing bladder outlet obstruction and/or certain medications. Patient is avoiding caffeine and alcohol in the evening. She is also restricting her fluids after 6:00 in the evening and voiding just prior to bedtime.  Patient is having daytime frequency as well, so I will address her OAB to see if this helps with her nocturia.  3. Frequency:   Patient's PVR is 0 mL, so she is not experiencing these symptoms incomplete bladder emptying.  Patient has uncontrolled hypertension, so I do not think she would be a good candidate for Myrbetriq.   I will start the patient on Vesicare 10 mg daily (#28), samples are given.  She will take them in the evening.   I have advised her of the side effects of Vesicare, such as: Dry eyes, dry mouth, constipation, mental confusion and/or urinary retention.  She will RTC in 3 weeks for symptom recheck and PVR.  4. Atrophic vaginitis:  Patient was given a sample of vaginal estrogen cream (PREMARIN) and instructed to apply 0.5mg  (pea-sized amount)  just inside the vaginal introitus with a finger-tip every night for two weeks and then Monday, Wednesday and Friday nights.  I explained to the patient that vaginally administered estrogen, which causes only a slight increase in the blood estrogen levels, have fewer contraindications and adverse systemic effects that oral HT.  Return in about 3 weeks (around 05/20/2015) for PVR and symptom recheck.  Zara Council, [facility] [address], [place], [place] [address] [phone]

## 2015-05-20 ENCOUNTER — Encounter: Payer: Self-pay | Admitting: Urology

## 2015-05-20 ENCOUNTER — Ambulatory Visit (INDEPENDENT_AMBULATORY_CARE_PROVIDER_SITE_OTHER): Payer: Medicare PPO | Admitting: Urology

## 2015-05-20 VITALS — BP 192/66 | HR 69 | Ht 66.0 in | Wt 194.3 lb

## 2015-05-20 DIAGNOSIS — N952 Postmenopausal atrophic vaginitis: Secondary | ICD-10-CM | POA: Diagnosis not present

## 2015-05-20 DIAGNOSIS — R351 Nocturia: Secondary | ICD-10-CM

## 2015-05-20 DIAGNOSIS — R350 Frequency of micturition: Secondary | ICD-10-CM

## 2015-05-20 DIAGNOSIS — R1030 Lower abdominal pain, unspecified: Secondary | ICD-10-CM

## 2015-05-20 LAB — BLADDER SCAN AMB NON-IMAGING: SCAN RESULT: 0

## 2015-05-20 MED ORDER — SOLIFENACIN SUCCINATE 10 MG PO TABS
10.0000 mg | ORAL_TABLET | Freq: Every day | ORAL | Status: DC
Start: 2015-05-20 — End: 2016-05-19

## 2015-05-20 MED ORDER — ESTROGENS, CONJUGATED 0.625 MG/GM VA CREA: 1.0000 | TOPICAL_CREAM | Freq: Every day | VAGINAL | Status: DC

## 2015-05-20 NOTE — Progress Notes (Signed)
05/20/2015 1:37 PM   Mary [name] [birth year] [id number]  Referring provider: Dion Body, MD [place], [place] [address]  Chief Complaint  Patient presents with  . Urinary Frequency    3 week  recheck  . Vaginitis    HPI: Patient is a 80 year old white female who presented to [place] three weeks ago with the complaint of suprapubic pain, frequency, urgency, dysuria, incontinence and nocturia x 5.  She was started on Vesicare 10 mg daily and Premarin vaginal cream and returns today to discuss the effectiveness of the medication.  She has had a reduction in her OAB symptoms and incontinence.  She is only getting up one time a night and the leakage has greatly diminished in volume.  The suprapubic pain and dysuria have abated.  She has not experienced any untoward side effects with the Vesicare and she has not had any rash or irritation with the Premarin cream.  PMH: Past Medical History  Diagnosis Date  . Diabetes   . Acid reflux   . Arthritis   . Depression   . Heart murmur   . HTN (hypertension)   . HLD (hyperlipidemia)   . Sleep apnea     Surgical History: Past Surgical History  Procedure Laterality Date  . Abdominal hysterectomy    . Varicose vein surgery    . Cystoscopy      Home Medications:    Medication List       This list is accurate as of: 05/20/15  1:37 PM.  Always use your most recent med list.               amLODipine 10 MG tablet  Commonly known as:  NORVASC  Take by mouth.     aspirin 81 MG tablet  Take by mouth.     conjugated estrogens vaginal cream  Commonly known as:  PREMARIN  Place 1 Applicatorful vaginally at bedtime.     conjugated estrogens vaginal cream  Commonly known as:  PREMARIN  Place 1 Applicatorful vaginally daily.     furosemide 20 MG tablet  Commonly known as:  LASIX  Take by mouth.     hydrochlorothiazide 12.5 MG tablet  Commonly known as:  HYDRODIURIL  Take by mouth.     levothyroxine  75 MCG tablet  Commonly known as:  SYNTHROID, LEVOTHROID  Take by mouth.     losartan 100 MG tablet  Commonly known as:  COZAAR  Take by mouth.     lovastatin 40 MG tablet  Commonly known as:  MEVACOR  Take by mouth.     metFORMIN 500 MG 24 hr tablet  Commonly known as:  GLUCOPHAGE-XR  Take by mouth.     omeprazole 40 MG capsule  Commonly known as:  PRILOSEC  Take by mouth.     PARoxetine 40 MG tablet  Commonly known as:  PAXIL  Take by mouth.     RA VITAMIN B-12 TR 1000 MCG Tbcr  Generic drug:  Cyanocobalamin  1 tab daily     solifenacin 10 MG tablet  Commonly known as:  VESICARE  Take 10 mg by mouth daily.     solifenacin 10 MG tablet  Commonly known as:  VESICARE  Take 1 tablet (10 mg total) by mouth daily.     traZODone 50 MG tablet  Commonly known as:  DESYREL  Take by mouth.     vitamin E 400 UNIT capsule  Take by mouth.  Allergies:  Allergies  Allergen Reactions  . Cephalexin Hives  . Nitrofurantoin     Other reaction(s): Other (See Comments) Other Reaction: measles-like lesions  . Other     Other reaction(s): Other (See Comments) Uncoded Allergy. Allergen: microdantin, Other Reaction: Measle-like Lesions  . Sulfa Antibiotics Hives  . Atorvastatin Other (See Comments)    Family History: Family History  Problem Relation Age of Onset  . Kidney disease Brother     also nephew  . Prostate cancer Neg Hx   . Bladder Cancer Neg Hx     Social History:  reports that she has never smoked. She does not have any smokeless tobacco history on file. She reports that she does not drink alcohol or use illicit drugs.  ROS: UROLOGY Frequent Urination?: Yes Hard to postpone urination?: No Burning/pain with urination?: No Get up at night to urinate?: Yes Leakage of urine?: No Urine stream starts and stops?: No Trouble starting stream?: No Do you have to strain to urinate?: No Blood in urine?: No Urinary tract infection?: No Sexually  transmitted disease?: No Injury to kidneys or bladder?: No Painful intercourse?: No Weak stream?: No Currently pregnant?: No Vaginal bleeding?: No Last menstrual period?: n  Gastrointestinal Nausea?: No Vomiting?: No Indigestion/heartburn?: No Diarrhea?: No Constipation?: No  Constitutional Fever: No Night sweats?: No Weight loss?: No Fatigue?: No  Skin Skin rash/lesions?: No Itching?: No  Eyes Blurred vision?: No Double vision?: No  Ears/Nose/Throat Sore throat?: No Sinus problems?: No  Hematologic/Lymphatic Swollen glands?: No Easy bruising?: No  Cardiovascular Leg swelling?: Yes Chest pain?: No  Respiratory Cough?: No Shortness of breath?: Yes  Endocrine Excessive thirst?: No  Musculoskeletal Back pain?: No Joint pain?: No  Neurological Headaches?: No Dizziness?: No  Psychologic Depression?: Yes Anxiety?: No  Physical Exam: BP 192/66 mmHg  [name] 69  Ht 5\' 6"  (1.676 m)  Wt 194 lb 4.8 oz (88.134 kg)  BMI 31.38 kg/m2  GU: No CVA tenderness. Atrophic external genitalia. Normal urethral meatus. No urethral masses and/or tenderness. No bladder fullness or masses. No vaginal lesions or discharge. Rectocele is noted, no masses. Normal anus and perineum Laboratory Data:  Lab Results  Component Value Date   WBC 4.6 12/30/2013   HGB 12.1 12/30/2013   HCT 37.4 12/30/2013   MCV 92 12/30/2013   PLT 129* 12/30/2013    Lab Results  Component Value Date   CREATININE 0.70 12/30/2013    Pertinent Imaging: Results for Mary, [name] (MRN [id number]) as of 05/20/2015 13:29  Ref. Range 05/20/2015 09:40  Scan Result Unknown 0    Assessment & Plan:    1. Suprapubic pain, unspecified laterality: Resolved with the Vesicare.  She will continue that medication.  2. Nocturia: Patient has experienced a reduction from 5 times nightly to 1 time a night with the Vesicare.  She will continue that medication.  3. Frequency: Patient's PVR is 0 mL and  her urinary frequency has been reduced with the Vesicare 10 mg daily.  She is not experiencing any untoward side effects.  She would like to continue the medication.  I have given her samples (#28) and sent a prescription into her pharmacy.  She is to contact the office if she has trouble filling the medication.  She will RTC in one year for symptom recheck and PVR.  - BLADDER SCAN AMB NON-IMAGING  4. Atrophic vaginitis: Patient was given a sample of vaginal estrogen cream (PREMARIN) and instructed to apply 0.5mg  (pea-sized amount) just inside the vaginal  introitus with a finger-tip on  Monday, Wednesday and Friday nights. I have sent a prescription into her pharmacy.  She is to contact the office if she has trouble filling the medication.  She will RTC in one year for exam.  - BLADDER SCAN AMB NON-IMAGING   Return in about 1 year (around 05/19/2016) for PVR and exam.  Zara Council, [facility]  [facility] [address] [place], [place] [address] [phone]

## 2015-07-14 ENCOUNTER — Other Ambulatory Visit: Payer: Self-pay | Admitting: Family Medicine

## 2015-07-14 DIAGNOSIS — Z1231 Encounter for screening mammogram for malignant neoplasm of breast: Secondary | ICD-10-CM

## 2015-07-23 ENCOUNTER — Ambulatory Visit
Admission: RE | Admit: 2015-07-23 | Discharge: 2015-07-23 | Disposition: A | Discharge status: Home / Self Care | Payer: Medicare PPO | Source: Ambulatory Visit | Attending: Family Medicine | Admitting: Family Medicine

## 2015-07-23 ENCOUNTER — Other Ambulatory Visit: Payer: Self-pay | Admitting: Family Medicine

## 2015-07-23 DIAGNOSIS — Z1231 Encounter for screening mammogram for malignant neoplasm of breast: Secondary | ICD-10-CM | POA: Insufficient documentation

## 2015-08-26 DIAGNOSIS — M5136 Other intervertebral disc degeneration, lumbar region: Secondary | ICD-10-CM | POA: Insufficient documentation

## 2015-08-26 DIAGNOSIS — M48062 Spinal stenosis, lumbar region with neurogenic claudication: Secondary | ICD-10-CM | POA: Insufficient documentation

## 2015-09-23 ENCOUNTER — Inpatient Hospital Stay
Admission: EM | Admit: 2015-09-23 | Discharge: 2015-09-26 | DRG: 470 | Disposition: A | Discharge status: Skilled Nursing Facility | Payer: Medicare Other | Attending: Specialist | Admitting: Specialist

## 2015-09-23 ENCOUNTER — Emergency Department: Payer: Medicare Other

## 2015-09-23 DIAGNOSIS — W19XXXA Unspecified fall, initial encounter: Secondary | ICD-10-CM

## 2015-09-23 DIAGNOSIS — E785 Hyperlipidemia, unspecified: Secondary | ICD-10-CM | POA: Diagnosis not present

## 2015-09-23 DIAGNOSIS — J45909 Unspecified asthma, uncomplicated: Secondary | ICD-10-CM | POA: Diagnosis not present

## 2015-09-23 DIAGNOSIS — E039 Hypothyroidism, unspecified: Secondary | ICD-10-CM | POA: Diagnosis not present

## 2015-09-23 DIAGNOSIS — Z9889 Other specified postprocedural states: Secondary | ICD-10-CM | POA: Diagnosis not present

## 2015-09-23 DIAGNOSIS — G4730 Sleep apnea, unspecified: Secondary | ICD-10-CM | POA: Diagnosis present

## 2015-09-23 DIAGNOSIS — E119 Type 2 diabetes mellitus without complications: Secondary | ICD-10-CM

## 2015-09-23 DIAGNOSIS — M1991 Primary osteoarthritis, unspecified site: Secondary | ICD-10-CM | POA: Diagnosis not present

## 2015-09-23 DIAGNOSIS — Y92009 Unspecified place in unspecified non-institutional (private) residence as the place of occurrence of the external cause: Secondary | ICD-10-CM

## 2015-09-23 DIAGNOSIS — K219 Gastro-esophageal reflux disease without esophagitis: Secondary | ICD-10-CM | POA: Diagnosis present

## 2015-09-23 DIAGNOSIS — R911 Solitary pulmonary nodule: Secondary | ICD-10-CM | POA: Diagnosis not present

## 2015-09-23 DIAGNOSIS — Z888 Allergy status to other drugs, medicaments and biological substances status: Secondary | ICD-10-CM | POA: Diagnosis not present

## 2015-09-23 DIAGNOSIS — R4182 Altered mental status, unspecified: Secondary | ICD-10-CM | POA: Diagnosis not present

## 2015-09-23 DIAGNOSIS — Z96641 Presence of right artificial hip joint: Secondary | ICD-10-CM | POA: Diagnosis not present

## 2015-09-23 DIAGNOSIS — Z79890 Hormone replacement therapy (postmenopausal): Secondary | ICD-10-CM

## 2015-09-23 DIAGNOSIS — M1990 Unspecified osteoarthritis, unspecified site: Secondary | ICD-10-CM | POA: Diagnosis present

## 2015-09-23 DIAGNOSIS — Z794 Long term (current) use of insulin: Secondary | ICD-10-CM | POA: Diagnosis not present

## 2015-09-23 DIAGNOSIS — Z7984 Long term (current) use of oral hypoglycemic drugs: Secondary | ICD-10-CM

## 2015-09-23 DIAGNOSIS — R350 Frequency of micturition: Secondary | ICD-10-CM | POA: Diagnosis not present

## 2015-09-23 DIAGNOSIS — F329 Major depressive disorder, single episode, unspecified: Secondary | ICD-10-CM | POA: Insufficient documentation

## 2015-09-23 DIAGNOSIS — I10 Essential (primary) hypertension: Secondary | ICD-10-CM | POA: Diagnosis not present

## 2015-09-23 DIAGNOSIS — Z96649 Presence of unspecified artificial hip joint: Secondary | ICD-10-CM

## 2015-09-23 DIAGNOSIS — W010XXA Fall on same level from slipping, tripping and stumbling without subsequent striking against object, initial encounter: Secondary | ICD-10-CM | POA: Diagnosis present

## 2015-09-23 DIAGNOSIS — S72001A Fracture of unspecified part of neck of right femur, initial encounter for closed fracture: Secondary | ICD-10-CM | POA: Diagnosis present

## 2015-09-23 DIAGNOSIS — K5900 Constipation, unspecified: Secondary | ICD-10-CM | POA: Diagnosis present

## 2015-09-23 DIAGNOSIS — R0600 Dyspnea, unspecified: Secondary | ICD-10-CM | POA: Diagnosis present

## 2015-09-23 DIAGNOSIS — I6782 Cerebral ischemia: Secondary | ICD-10-CM | POA: Diagnosis not present

## 2015-09-23 DIAGNOSIS — R29810 Facial weakness: Secondary | ICD-10-CM | POA: Diagnosis not present

## 2015-09-23 DIAGNOSIS — Z882 Allergy status to sulfonamides status: Secondary | ICD-10-CM | POA: Diagnosis not present

## 2015-09-23 DIAGNOSIS — Z79899 Other long term (current) drug therapy: Secondary | ICD-10-CM | POA: Diagnosis not present

## 2015-09-23 DIAGNOSIS — Z7982 Long term (current) use of aspirin: Secondary | ICD-10-CM | POA: Diagnosis not present

## 2015-09-23 DIAGNOSIS — F32A Depression, unspecified: Secondary | ICD-10-CM | POA: Insufficient documentation

## 2015-09-23 DIAGNOSIS — R29898 Other symptoms and signs involving the musculoskeletal system: Secondary | ICD-10-CM

## 2015-09-23 DIAGNOSIS — F419 Anxiety disorder, unspecified: Secondary | ICD-10-CM | POA: Diagnosis present

## 2015-09-23 DIAGNOSIS — N952 Postmenopausal atrophic vaginitis: Secondary | ICD-10-CM | POA: Diagnosis not present

## 2015-09-23 DIAGNOSIS — R0902 Hypoxemia: Secondary | ICD-10-CM | POA: Diagnosis present

## 2015-09-23 LAB — BASIC METABOLIC PANEL
ANION GAP: 8 (ref 5–15)
BUN: 11 mg/dL (ref 6–20)
CO2: 29 mmol/L (ref 22–32)
Calcium: 9 mg/dL (ref 8.9–10.3)
Chloride: 102 mmol/L (ref 101–111)
Creatinine, Ser: 0.55 mg/dL (ref 0.44–1.00)
GFR calc Af Amer: >60 mL/min (ref >60)
Glucose, Bld: 150 mg/dL — ABNORMAL HIGH (ref 65–99)
POTASSIUM: 3.5 mmol/L (ref 3.5–5.1)
SODIUM: 139 mmol/L (ref 135–145)

## 2015-09-23 LAB — TYPE AND SCREEN
ABO/RH(D): B POS
ANTIBODY SCREEN: NEGATIVE

## 2015-09-23 LAB — CBC WITH DIFFERENTIAL/PLATELET
BASOS ABS: 0 10*3/uL (ref 0–0.1)
BASOS PCT: 1 %
EOS ABS: 0.1 10*3/uL (ref 0–0.7)
EOS PCT: 1 %
HCT: 38.4 % (ref 35.0–47.0)
Hemoglobin: 12.2 g/dL (ref 12.0–16.0)
Lymphocytes Relative: 15 %
Lymphs Abs: 1.2 10*3/uL (ref 1.0–3.6)
MCH: 28.2 pg (ref 26.0–34.0)
MCHC: 31.7 g/dL — ABNORMAL LOW (ref 32.0–36.0)
MCV: 89.1 fL (ref 80.0–100.0)
Monocytes Absolute: 0.4 10*3/uL (ref 0.2–0.9)
Monocytes Relative: 5 %
Neutro Abs: 6.4 10*3/uL (ref 1.4–6.5)
Neutrophils Relative %: 78 %
PLATELETS: 161 10*3/uL (ref 150–440)
RBC: 4.31 MIL/uL (ref 3.80–5.20)
RDW: 15.6 % — ABNORMAL HIGH (ref 11.5–14.5)
WBC: 8.1 10*3/uL (ref 3.6–11.0)

## 2015-09-23 MED ORDER — MORPHINE SULFATE (PF) 4 MG/ML IV SOLN
4.0000 mg | Freq: Once | INTRAVENOUS | Status: AC
  Administered 2015-09-23: 4 mg via INTRAVENOUS
  Filled 2015-09-23: qty 1

## 2015-09-23 MED ORDER — VANCOMYCIN HCL IN DEXTROSE 1-5 GM/200ML-% IV SOLN
1000.0000 mg | Freq: Once | INTRAVENOUS | Status: AC
  Administered 2015-09-23: 1000 mg via INTRAVENOUS
  Filled 2015-09-23: qty 200

## 2015-09-23 MED ORDER — ONDANSETRON HCL 4 MG/2ML IJ SOLN
4.0000 mg | Freq: Once | INTRAMUSCULAR | Status: AC
  Administered 2015-09-23: 4 mg via INTRAVENOUS
  Filled 2015-09-23: qty 2

## 2015-09-23 NOTE — ED Provider Notes (Signed)
[facility] Emergency Department Provider Note  ____________________________________________  Time seen: Seen upon arrival to the emergency department  I have reviewed the triage vital signs and the nursing notes.   HISTORY  Chief Complaint Hip Pain    HPI Mary [name] is a 81 y.o. female with a history of diabetes who is presenting today after a mechanical fall. She says that she was walking in her home when she lost her footing and fell on her right side. She says that she did hit her head lightly but is not having any pain there at this time. The majority of the pain is to the right hip. She was unable to walk afterwards. She called EMS who found the right hip to be shortened and externally rotated. The patient says that she is feeling in the right lower extremity is able to move her toes but not lift the leg. She says that the only blood thinner she takes is aspirin.   Past Medical History  Diagnosis Date  . Diabetes ([place])   . Acid reflux   . Arthritis   . Depression   . Heart murmur   . HTN (hypertension)   . HLD (hyperlipidemia)   . Sleep apnea     Patient Active Problem List   Diagnosis Date Noted  . Fracture of femoral neck, right ([place]) 09/23/2015  . Type 2 diabetes mellitus ([place]) 09/23/2015  . HTN (hypertension) 09/23/2015  . HLD (hyperlipidemia) 09/23/2015  . GERD (gastroesophageal reflux disease) 09/23/2015  . Depression 09/23/2015  . Hypothyroidism 09/23/2015  . Nocturia 04/29/2015  . Urinary frequency 04/29/2015  . Atrophic vaginitis 04/29/2015    Past Surgical History  Procedure Laterality Date  . Abdominal hysterectomy    . Varicose vein surgery    . Cystoscopy    . Knee arthroscopy w/ autogenous cartilage implantation (aci) procedure      Current Outpatient Rx  Name  Route  Sig  Dispense  Refill  . amLODipine (NORVASC) 10 MG tablet   Oral   Take 10 mg by mouth daily.          Marland Kitchen aspirin 81 MG tablet   Oral   Take  81 mg by mouth daily.          . furosemide (LASIX) 20 MG tablet   Oral   Take 20 mg by mouth daily.          . hydrochlorothiazide (HYDRODIURIL) 12.5 MG tablet   Oral   Take 12.5 mg by mouth daily.          Marland Kitchen levothyroxine (SYNTHROID, LEVOTHROID) 75 MCG tablet   Oral   Take 75 mcg by mouth daily.          Marland Kitchen losartan (COZAAR) 100 MG tablet   Oral   Take 100 mg by mouth daily.          Marland Kitchen lovastatin (MEVACOR) 40 MG tablet   Oral   Take 40 mg by mouth at bedtime.          . metFORMIN (GLUCOPHAGE-XR) 500 MG 24 hr tablet   Oral   Take 500 mg by mouth daily.          Marland Kitchen omeprazole (PRILOSEC) 40 MG capsule   Oral   Take 40 mg by mouth daily.          . solifenacin (VESICARE) 10 MG tablet   Oral   Take 1 tablet (10 mg total) by mouth daily.   [place]  tablet   12   . vitamin B-12 (CYANOCOBALAMIN) 1000 MCG tablet   Oral   Take 1,000 mcg by mouth daily.         . vitamin E 400 UNIT capsule   Oral   Take 400 Units by mouth daily.          Marland Kitchen conjugated estrogens (PREMARIN) vaginal cream   Vaginal   Place 1 Applicatorful vaginally daily. Patient not taking: Reported on 09/23/2015   30 g   12   . EXPIRED: PARoxetine (PAXIL) 40 MG tablet   Oral   Take by mouth.           Allergies Cephalexin; Nitrofurantoin; Other; Sulfa antibiotics; and Atorvastatin  Family History  Problem Relation Age of Onset  . Kidney disease Brother     also nephew  . Prostate cancer Neg Hx   . Bladder Cancer Neg Hx   . Breast cancer Neg Hx     Social History Social History  Substance Use Topics  . Smoking status: Never Smoker   . Smokeless tobacco: None  . Alcohol Use: No    Review of Systems Constitutional: No fever/chills Eyes: No visual changes. ENT: No sore throat. Cardiovascular: Denies chest pain. Respiratory: Denies shortness of breath. Gastrointestinal: No abdominal pain.  No nausea, no vomiting.  No diarrhea.  No constipation. Genitourinary: Negative  for dysuria. Musculoskeletal: Chronic back pain which is the low lumbar and unchanged from previous. She denies falling on her back. Says that she fell directly onto her right side. Skin: Negative for rash. Neurological: Negative for headaches, focal weakness or numbness.  10-point ROS otherwise negative.  ____________________________________________   PHYSICAL EXAM:  VITAL SIGNS: ED Triage Vitals  Enc Vitals Group     BP 09/23/15 2004 192/159 mmHg     Pulse Rate 09/23/15 2004 67     Resp 09/23/15 2004 20     Temp 09/23/15 2004 97.6 F (36.4 C)     Temp src --      SpO2 09/23/15 2004 97 %     Weight 09/23/15 2004 195 lb (88.451 kg)     Height 09/23/15 2004 5\' 6"  (1.676 m)     Head Cir --      Peak Flow --      Pain Score --      Pain Loc --      Pain Edu? --      Excl. in [place]? --     Constitutional: Alert and oriented. Well appearing and in no acute distress. Eyes: Conjunctivae are normal. PERRL. EOMI. Head: Atraumatic. Nose: No congestion/rhinnorhea. Mouth/Throat: Mucous membranes are moist.  Oropharynx non-erythematous. Neck: No stridor.   Cardiovascular: Normal rate, regular rhythm. Grossly normal heart sounds.  Good peripheral circulation. Respiratory: Normal respiratory effort.  No retractions. Lungs CTAB. Gastrointestinal: Soft and nontender. No distention. No abdominal bruits. No CVA tenderness. Musculoskeletal: Right lower extremity shortened about 2 cm and externally rotated. Right hip is very tender to touch. There are bilateral strong dorsalis pedis pulses as well as sensation intact to light touch to the bilateral feet. She is also able to range her toes fully. Neurologic:  Normal speech and language. No gross focal neurologic deficits are appreciated. No gait instability. Skin:  Skin is warm, dry and intact. No rash noted. Psychiatric: Mood and affect are normal. Speech and behavior are normal.  ____________________________________________   LABS (all labs  ordered are listed, but only abnormal results are displayed)  Labs Reviewed  CBC WITH DIFFERENTIAL/PLATELET - Abnormal; Notable for the following:    MCHC 31.7 (*)    RDW 15.6 (*)    All other components within normal limits  BASIC METABOLIC PANEL  TYPE AND SCREEN  ABO/RH   ____________________________________________  EKG  ED ECG REPORT I, Jerimah Witucki,  Youlanda Roys, the attending physician, personally viewed and interpreted this ECG.   Date: 09/23/2015  EKG Time: 2053  Rate: 63  Rhythm: normal sinus rhythm  Axis: Normal  Intervals:none  ST&T Change: No ST segment elevation or depression. No abnormal T-wave inversion.  ____________________________________________  RADIOLOGY  Mildly displaced negative fracture of the right femoral neck. Chest x-ray without any acute disease. ____________________________________________   PROCEDURES   ____________________________________________   INITIAL IMPRESSION / ASSESSMENT AND PLAN / ED COURSE  Pertinent labs & imaging results that were available during my care of the patient were reviewed by me and considered in my medical decision making (see chart for details).  ----------------------------------------- 9:10 PM on 09/23/2015 -----------------------------------------  Discussed case with Dr.Poggi of orthopedics will see the patient is a consult.  ----------------------------------------- 11:03 PM on 09/23/2015 -----------------------------------------  Patient pending lab results but electrolytes need to be redrawn secondary to hemolysis.  Signed out to Dr. Jannifer Franklin of the medicine service. The patient as well as family were explained the imaging results and need for [place] and likely surgical repair. The patient is requesting Dr. Marry Guan. I did discuss this with Dr.Poggi who said that he would discuss this with Dr. Marry Guan to see if Dr. Marry Guan would be willing to accept the patient. I explained this to the patient and the family  as well. ____________________________________________   FINAL CLINICAL IMPRESSION(S) / ED DIAGNOSES  Final diagnoses:  Fall, initial encounter  Hip fracture, right, closed, initial encounter ([place])      Orbie Pyo, MD 09/23/15 [phone]

## 2015-09-23 NOTE — H&P (Signed)
[place] [facility] [place] NAME: Mary [name]    MR#:  [id number]  DATE OF BIRTH:  1935-04-02  DATE OF ADMISSION:  09/23/2015  PRIMARY CARE PHYSICIAN: Dion Body, MD   REQUESTING/REFERRING PHYSICIAN: Clearnce Hasten, MD  CHIEF COMPLAINT:   Chief Complaint  Patient presents with  . Hip Pain    HISTORY OF PRESENT ILLNESS:  Mary [name]  is a 81 y.o. female who presents with right hip fracture. Patient was at home and had a mechanical fall. She tripped over her husband's oxygen tubing. She landed on her right side. She states when she fell that she was concerned that she might have broken something. She came to ED for evaluation and was found to have a right hip fracture. Orthopedics was consulted in the ED and asked for the hospitalist to admit the patient to get surgical repair of her fracture tomorrow.  PAST MEDICAL HISTORY:   Past Medical History  Diagnosis Date  . Diabetes ([place])   . Acid reflux   . Arthritis   . Depression   . Heart murmur   . HTN (hypertension)   . HLD (hyperlipidemia)   . Sleep apnea     PAST SURGICAL HISTORY:   Past Surgical History  Procedure Laterality Date  . Abdominal hysterectomy    . Varicose vein surgery    . Cystoscopy    . Knee arthroscopy w/ autogenous cartilage implantation (aci) procedure      SOCIAL HISTORY:   Social History  Substance Use Topics  . Smoking status: Never Smoker   . Smokeless tobacco: Not on file  . Alcohol Use: No    FAMILY HISTORY:   Family History  Problem Relation Age of Onset  . Kidney disease Brother     also nephew  . Prostate cancer Neg Hx   . Bladder Cancer Neg Hx   . Breast cancer Neg Hx     DRUG ALLERGIES:   Allergies  Allergen Reactions  . Cephalexin Hives  . Nitrofurantoin     Other reaction(s): Other (See Comments) Other Reaction: measles-like lesions  . Other     Other reaction(s): Other (See Comments) Uncoded Allergy. Allergen:  microdantin, Other Reaction: Measle-like Lesions  . Sulfa Antibiotics Hives  . Atorvastatin Other (See Comments)    MEDICATIONS AT HOME:   Prior to Admission medications   Medication Sig Start Date End Date Taking? Authorizing Provider  amLODipine (NORVASC) 10 MG tablet Take 10 mg by mouth daily.    Yes Historical Provider, MD  aspirin 81 MG tablet Take 81 mg by mouth daily.    Yes Historical Provider, MD  furosemide (LASIX) 20 MG tablet Take 20 mg by mouth daily.    Yes Historical Provider, MD  hydrochlorothiazide (HYDRODIURIL) 12.5 MG tablet Take 12.5 mg by mouth daily.    Yes Historical Provider, MD  levothyroxine (SYNTHROID, LEVOTHROID) 75 MCG tablet Take 75 mcg by mouth daily.    Yes Historical Provider, MD  losartan (COZAAR) 100 MG tablet Take 100 mg by mouth daily.    Yes Historical Provider, MD  lovastatin (MEVACOR) 40 MG tablet Take 40 mg by mouth at bedtime.    Yes Historical Provider, MD  metFORMIN (GLUCOPHAGE-XR) 500 MG 24 hr tablet Take 500 mg by mouth daily.    Yes Historical Provider, MD  omeprazole (PRILOSEC) 40 MG capsule Take 40 mg by mouth daily.    Yes Historical Provider, MD  solifenacin (VESICARE) 10 MG tablet Take 1  tablet (10 mg total) by mouth daily. 05/20/15  Yes Shannon A McGowan, PA-C  vitamin B-12 (CYANOCOBALAMIN) 1000 MCG tablet Take 1,000 mcg by mouth daily.   Yes Historical Provider, MD  vitamin E 400 UNIT capsule Take 400 Units by mouth daily.    Yes Historical Provider, MD  conjugated estrogens (PREMARIN) vaginal cream Place 1 Applicatorful vaginally daily. Patient not taking: Reported on 09/23/2015 05/20/15   Nori Riis, PA-C  PARoxetine (PAXIL) 40 MG tablet Take by mouth. 08/13/14 08/13/15  Historical Provider, MD    REVIEW OF SYSTEMS:  Review of Systems  Constitutional: Negative for fever, chills, weight loss and malaise/fatigue.  HENT: Negative for ear pain, hearing loss and tinnitus.   Eyes: Negative for blurred vision, double vision, pain and  redness.  Respiratory: Negative for cough, hemoptysis and shortness of breath.   Cardiovascular: Negative for chest pain, palpitations, orthopnea and leg swelling.  Gastrointestinal: Negative for nausea, vomiting, abdominal pain, diarrhea and constipation.  Genitourinary: Negative for dysuria, frequency and hematuria.  Musculoskeletal: Positive for joint pain (right hip). Negative for back pain and neck pain.  Skin:       No acne, rash, or lesions  Neurological: Negative for dizziness, tremors, focal weakness and weakness.  Endo/Heme/Allergies: Negative for polydipsia. Does not bruise/bleed easily.  Psychiatric/Behavioral: Negative for depression. The patient is not nervous/anxious and does not have insomnia.      VITAL SIGNS:   Filed Vitals:   09/23/15 2004  BP: 192/159  Pulse: 67  Temp: 97.6 F (36.4 C)  Resp: 20  Height: 5\' 6"  (1.676 m)  Weight: 88.451 kg (195 lb)  SpO2: 97%   Wt Readings from Last 3 Encounters:  09/23/15 88.451 kg (195 lb)  05/20/15 88.134 kg (194 lb 4.8 oz)  04/29/15 89.086 kg (196 lb 6.4 oz)    PHYSICAL EXAMINATION:  Physical Exam  Vitals reviewed. Constitutional: She is oriented to person, place, and time. She appears well-developed and well-nourished. No distress.  HENT:  Head: Normocephalic and atraumatic.  Mouth/Throat: Oropharynx is clear and moist.  Eyes: Conjunctivae and EOM are normal. Pupils are equal, round, and reactive to light. No scleral icterus.  Neck: Normal range of motion. Neck supple. No JVD present. No thyromegaly present.  Cardiovascular: Normal rate, regular rhythm and intact distal pulses.  Exam reveals no gallop and no friction rub.   No murmur heard. Respiratory: Effort normal and breath sounds normal. No respiratory distress. She has no wheezes. She has no rales.  GI: Soft. Bowel sounds are normal. She exhibits no distension. There is no tenderness.  Musculoskeletal: Normal range of motion. She exhibits tenderness (right  hip). She exhibits no edema.  Right lower extremity version, no arthritis, no gout  Lymphadenopathy:    She has no cervical adenopathy.  Neurological: She is alert and oriented to person, place, and time. No cranial nerve deficit.  No dysarthria, no aphasia  Skin: Skin is warm and dry. No rash noted. No erythema.  Psychiatric: She has a normal mood and affect. Her behavior is normal. Judgment and thought content normal.    LABORATORY PANEL:   CBC  Recent Labs Lab 09/23/15 2204  WBC 8.1  HGB 12.2  HCT 38.4  PLT 161   ------------------------------------------------------------------------------------------------------------------  Chemistries  No results for input(s): NA, K, CL, CO2, GLUCOSE, BUN, CREATININE, CALCIUM, MG, AST, ALT, ALKPHOS, BILITOT in the last 168 hours.  Invalid input(s): GFRCGP ------------------------------------------------------------------------------------------------------------------  Cardiac Enzymes No results for input(s): TROPONINI in the last 168 hours. ------------------------------------------------------------------------------------------------------------------  RADIOLOGY:  Dg Chest 1 View  09/23/2015  CLINICAL DATA:  [place] at home tonight. Fell onto right hip. Also hit head. No chest complaints. History of diabetes and hypertension. EXAM: CHEST 1 VIEW COMPARISON:  None. FINDINGS: Cardiac silhouette is normal in size and configuration. No mediastinal or hilar masses or convincing adenopathy. Clear lungs.  No pleural effusion or pneumothorax. Bony thorax is grossly intact. IMPRESSION: No active disease. Electronically Signed   By: Lajean Manes M.D.   On: 09/23/2015 20:55   Dg Hip Unilat With Pelvis 2-3 Views Right  09/23/2015  CLINICAL DATA:  [place] at home tonight. Patient tripped coming out of the bathroom. Fell onto right hip. Complaining of right hip pain. EXAM: DG HIP (WITH OR WITHOUT PELVIS) 2-3V RIGHT COMPARISON:  None. FINDINGS: There is a  fracture of the right femoral neck, mid cervical. Distal fracture component has displaced superiorly in relation to the femoral head component, by approximately 1.8 cm. There is significant varus angulation as well as apex anterior angulation. No significant comminution. No other fractures. The right femoral head is normally aligned with the acetabulum. SI joints, symphysis pubis and left hip joint are normally aligned. IMPRESSION: 1. Mildly displaced, angulated fracture of the right femoral neck. Electronically Signed   By: Lajean Manes M.D.   On: 09/23/2015 20:57    EKG:   Orders placed or performed during the hospital encounter of 09/23/15  . ED EKG  . ED EKG  . ED EKG  . ED EKG  . EKG 12-Lead  . EKG 12-Lead    IMPRESSION AND PLAN:  Principal Problem:   Fracture of femoral neck, right ([facility]) - plan for surgical repair of right hip fracture by orthopedic surgery tomorrow. Pain control on board. Active Problems:   Type 2 diabetes mellitus ([facility]) - siding scale insulin corresponding glucose checks every 6 hours while nothing by mouth   HTN (hypertension) - continue home meds   HLD (hyperlipidemia) - continue home statin   GERD (gastroesophageal reflux disease) - home dose PPI   Hypothyroidism - continue home dose thyroid replacement  All the records are reviewed and case discussed with ED provider. Management plans discussed with the patient and/or family.  DVT PROPHYLAXIS: Systemic anticoagulation  GI PROPHYLAXIS: PPI  ADMISSION STATUS: Inpatient  CODE STATUS: Full Code Status History    This patient does not have a recorded code status. Please follow your organizational policy for patients in this situation.      TOTAL TIME TAKING CARE OF THIS PATIENT: 40 minutes.    Samariyah Cowles [place] 09/23/2015, 10:51 PM  [facility]  Office  [phone]  CC: Primary care physician; Dion Body, MD

## 2015-09-23 NOTE — ED Notes (Signed)
Pt presents via [facility] after a fall at home. Pt was coming out of bathroom and tripped, fell on R hip. Hip is externally rotated and leg is shortened. EMS states pt hit head, but pt is not complaining of head hurting, just back and R hip. Pt states she fell on hardwood floor.

## 2015-09-23 NOTE — ED Notes (Signed)
Pt transported to xray via stretcher

## 2015-09-23 NOTE — ED Notes (Signed)
Dr. Willis at bedside  

## 2015-09-24 ENCOUNTER — Inpatient Hospital Stay: Payer: Medicare Other

## 2015-09-24 ENCOUNTER — Inpatient Hospital Stay: Payer: Medicare Other | Admitting: Certified Registered Nurse Anesthetist

## 2015-09-24 ENCOUNTER — Encounter: Payer: Self-pay | Admitting: *Deleted

## 2015-09-24 ENCOUNTER — Encounter
Admission: EM | Disposition: A | Discharge status: Skilled Nursing Facility | Payer: Self-pay | Source: Home / Self Care | Attending: Specialist

## 2015-09-24 HISTORY — PX: HIP ARTHROPLASTY: SHX981

## 2015-09-24 LAB — CBC
HCT: 36.1 % (ref 35.0–47.0)
HEMOGLOBIN: 11.7 g/dL — AB (ref 12.0–16.0)
MCH: 28.5 pg (ref 26.0–34.0)
MCHC: 32.5 g/dL (ref 32.0–36.0)
MCV: 87.7 fL (ref 80.0–100.0)
PLATELETS: 151 10*3/uL (ref 150–440)
RBC: 4.11 MIL/uL (ref 3.80–5.20)
RDW: 14.8 % — ABNORMAL HIGH (ref 11.5–14.5)
WBC: 10.4 10*3/uL (ref 3.6–11.0)

## 2015-09-24 LAB — BASIC METABOLIC PANEL
Anion gap: 3 — ABNORMAL LOW (ref 5–15)
BUN: 10 mg/dL (ref 6–20)
CALCIUM: 8.8 mg/dL — AB (ref 8.9–10.3)
CHLORIDE: 104 mmol/L (ref 101–111)
CO2: 30 mmol/L (ref 22–32)
CREATININE: 0.53 mg/dL (ref 0.44–1.00)
GFR calc Af Amer: >60 mL/min (ref >60)
GFR calc non Af Amer: >60 mL/min (ref >60)
GLUCOSE: 169 mg/dL — AB (ref 65–99)
Potassium: 3.7 mmol/L (ref 3.5–5.1)
Sodium: 137 mmol/L (ref 135–145)

## 2015-09-24 LAB — SURGICAL PCR SCREEN
MRSA, PCR: NEGATIVE
STAPHYLOCOCCUS AUREUS: POSITIVE — AB

## 2015-09-24 LAB — GLUCOSE, CAPILLARY
GLUCOSE-CAPILLARY: 142 mg/dL — AB (ref 65–99)
GLUCOSE-CAPILLARY: 147 mg/dL — AB (ref 65–99)
Glucose-Capillary: 157 mg/dL — ABNORMAL HIGH (ref 65–99)
Glucose-Capillary: 128 mg/dL — ABNORMAL HIGH (ref 65–99)
Glucose-Capillary: 117 mg/dL — ABNORMAL HIGH (ref 65–99)

## 2015-09-24 LAB — ABO/RH: ABO/RH(D): B POS

## 2015-09-24 LAB — HEMOGLOBIN A1C: HEMOGLOBIN A1C: 7.1 % — AB (ref 4.0–6.0)

## 2015-09-24 SURGERY — HEMIARTHROPLASTY, HIP, DIRECT ANTERIOR APPROACH, FOR FRACTURE
Anesthesia: Spinal | Site: Hip | Laterality: Right | Wound class: Clean

## 2015-09-24 MED ORDER — SODIUM CHLORIDE 0.9 % IJ SOLN
INTRAMUSCULAR | Status: AC
  Filled 2015-09-24: qty 50

## 2015-09-24 MED ORDER — INSULIN ASPART 100 UNIT/ML ~~LOC~~ SOLN
0.0000 [IU] | Freq: Four times a day (QID) | SUBCUTANEOUS | Status: DC
  Administered 2015-09-24 (×2): 1 [IU] via SUBCUTANEOUS
  Administered 2015-09-24 – 2015-09-25 (×3): 2 [IU] via SUBCUTANEOUS
  Administered 2015-09-25 – 2015-09-26 (×3): 1 [IU] via SUBCUTANEOUS
  Filled 2015-09-24: qty 2
  Filled 2015-09-24: qty 1
  Filled 2015-09-24: qty 2
  Filled 2015-09-24 (×2): qty 1
  Filled 2015-09-24: qty 2
  Filled 2015-09-24 (×2): qty 1

## 2015-09-24 MED ORDER — HYDROMORPHONE HCL 1 MG/ML IJ SOLN
0.5000 mg | Freq: Once | INTRAMUSCULAR | Status: AC
  Administered 2015-09-24: 0.5 mg via INTRAVENOUS
  Filled 2015-09-24: qty 1

## 2015-09-24 MED ORDER — LIDOCAINE HCL (CARDIAC) 20 MG/ML IV SOLN
INTRAVENOUS | Status: DC | PRN
  Administered 2015-09-24: 60 mg via INTRAVENOUS

## 2015-09-24 MED ORDER — LOSARTAN POTASSIUM 50 MG PO TABS
100.0000 mg | ORAL_TABLET | Freq: Every day | ORAL | Status: DC
  Administered 2015-09-24 – 2015-09-26 (×3): 100 mg via ORAL
  Filled 2015-09-24 (×3): qty 2

## 2015-09-24 MED ORDER — PAROXETINE HCL 10 MG PO TABS
40.0000 mg | ORAL_TABLET | Freq: Every day | ORAL | Status: DC
  Administered 2015-09-24 – 2015-09-26 (×3): 40 mg via ORAL
  Filled 2015-09-24 (×3): qty 4

## 2015-09-24 MED ORDER — ONDANSETRON HCL 4 MG PO TABS: 4.0000 mg | ORAL_TABLET | Freq: Four times a day (QID) | ORAL | Status: DC | PRN

## 2015-09-24 MED ORDER — BUPIVACAINE-EPINEPHRINE (PF) 0.25% -1:200000 IJ SOLN
INTRAMUSCULAR | Status: DC | PRN
  Administered 2015-09-24: 30 mL

## 2015-09-24 MED ORDER — FENTANYL CITRATE (PF) 100 MCG/2ML IJ SOLN
INTRAMUSCULAR | Status: DC | PRN
  Administered 2015-09-24: 1 ug via INTRAVENOUS
  Administered 2015-09-24: 25 ug via INTRAVENOUS

## 2015-09-24 MED ORDER — HYDROMORPHONE HCL 1 MG/ML IJ SOLN
0.5000 mg | INTRAMUSCULAR | Status: DC | PRN
  Administered 2015-09-24 – 2015-09-25 (×4): 0.5 mg via INTRAVENOUS
  Filled 2015-09-24 (×5): qty 1

## 2015-09-24 MED ORDER — PRAVASTATIN SODIUM 20 MG PO TABS: 40.0000 mg | ORAL_TABLET | Freq: Every day | ORAL | Status: DC

## 2015-09-24 MED ORDER — MAGNESIUM HYDROXIDE 400 MG/5ML PO SUSP
30.0000 mL | Freq: Every day | ORAL | Status: DC | PRN
  Administered 2015-09-25: 30 mL via ORAL
  Filled 2015-09-24: qty 30

## 2015-09-24 MED ORDER — ONDANSETRON HCL 4 MG/2ML IJ SOLN
4.0000 mg | Freq: Four times a day (QID) | INTRAMUSCULAR | Status: DC | PRN
  Administered 2015-09-24 (×2): 4 mg via INTRAVENOUS
  Filled 2015-09-24: qty 2

## 2015-09-24 MED ORDER — FENTANYL CITRATE (PF) 100 MCG/2ML IJ SOLN
25.0000 ug | INTRAMUSCULAR | Status: DC | PRN
Start: 2015-09-24 — End: 2015-09-24

## 2015-09-24 MED ORDER — BISACODYL 10 MG RE SUPP
10.0000 mg | Freq: Every day | RECTAL | Status: DC | PRN
  Administered 2015-09-26: 10 mg via RECTAL
  Filled 2015-09-24: qty 1

## 2015-09-24 MED ORDER — AMLODIPINE BESYLATE 5 MG PO TABS
ORAL_TABLET | ORAL | Status: AC
  Administered 2015-09-24: 10 mg via ORAL
  Filled 2015-09-24: qty 2

## 2015-09-24 MED ORDER — FUROSEMIDE 20 MG PO TABS
20.0000 mg | ORAL_TABLET | Freq: Every day | ORAL | Status: DC
  Administered 2015-09-24 – 2015-09-26 (×3): 20 mg via ORAL
  Filled 2015-09-24 (×4): qty 1

## 2015-09-24 MED ORDER — HYDRALAZINE HCL 20 MG/ML IJ SOLN
10.0000 mg | INTRAMUSCULAR | Status: DC | PRN
  Administered 2015-09-24: 10 mg via INTRAVENOUS
  Filled 2015-09-24: qty 1

## 2015-09-24 MED ORDER — ACETAMINOPHEN 650 MG RE SUPP: 650.0000 mg | Freq: Four times a day (QID) | RECTAL | Status: DC | PRN

## 2015-09-24 MED ORDER — TRANEXAMIC ACID 1000 MG/10ML IV SOLN
INTRAVENOUS | Status: AC
  Filled 2015-09-24: qty 10

## 2015-09-24 MED ORDER — VITAMIN B-12 1000 MCG PO TABS
1000.0000 ug | ORAL_TABLET | Freq: Every day | ORAL | Status: DC
Start: 2015-09-24 — End: 2015-09-26
  Administered 2015-09-25 – 2015-09-26 (×2): 1000 ug via ORAL
  Filled 2015-09-24 (×2): qty 1

## 2015-09-24 MED ORDER — FLEET ENEMA 7-19 GM/118ML RE ENEM
1.0000 | ENEMA | Freq: Once | RECTAL | Status: AC | PRN
  Administered 2015-09-26: 1 via RECTAL

## 2015-09-24 MED ORDER — DOCUSATE SODIUM 100 MG PO CAPS
100.0000 mg | ORAL_CAPSULE | Freq: Two times a day (BID) | ORAL | Status: DC
Start: 2015-09-24 — End: 2015-09-26
  Administered 2015-09-25 – 2015-09-26 (×3): 100 mg via ORAL
  Filled 2015-09-24 (×3): qty 1

## 2015-09-24 MED ORDER — PROPOFOL 10 MG/ML IV BOLUS
INTRAVENOUS | Status: DC | PRN
  Administered 2015-09-24: 10 mg via INTRAVENOUS

## 2015-09-24 MED ORDER — DIPHENHYDRAMINE HCL 12.5 MG/5ML PO ELIX: 12.5000 mg | ORAL_SOLUTION | ORAL | Status: DC | PRN

## 2015-09-24 MED ORDER — ACETAMINOPHEN 325 MG PO TABS: 650.0000 mg | ORAL_TABLET | Freq: Four times a day (QID) | ORAL | Status: DC | PRN

## 2015-09-24 MED ORDER — ONDANSETRON HCL 4 MG/2ML IJ SOLN: 4.0000 mg | Freq: Once | INTRAMUSCULAR | Status: DC | PRN

## 2015-09-24 MED ORDER — HYDROMORPHONE HCL 1 MG/ML IJ SOLN: 0.5000 mg | INTRAMUSCULAR | Status: DC | PRN

## 2015-09-24 MED ORDER — KETAMINE HCL 10 MG/ML IJ SOLN
INTRAMUSCULAR | Status: DC | PRN
  Administered 2015-09-24: 20 mg via INTRAVENOUS

## 2015-09-24 MED ORDER — MIDAZOLAM HCL 5 MG/5ML IJ SOLN
INTRAMUSCULAR | Status: DC | PRN
  Administered 2015-09-24: 1 mg via INTRAVENOUS

## 2015-09-24 MED ORDER — LACTATED RINGERS IV SOLN
INTRAVENOUS | Status: DC | PRN
  Administered 2015-09-24: 17:00:00 via INTRAVENOUS

## 2015-09-24 MED ORDER — POTASSIUM CHLORIDE IN NACL 20-0.9 MEQ/L-% IV SOLN
INTRAVENOUS | Status: DC
  Administered 2015-09-24: 22:00:00 via INTRAVENOUS
  Filled 2015-09-24 (×7): qty 1000

## 2015-09-24 MED ORDER — METOCLOPRAMIDE HCL 5 MG/ML IJ SOLN: 5.0000 mg | Freq: Three times a day (TID) | INTRAMUSCULAR | Status: DC | PRN

## 2015-09-24 MED ORDER — SODIUM CHLORIDE 0.9 % IV SOLN
INTRAVENOUS | Status: AC
  Administered 2015-09-24: 04:00:00 via INTRAVENOUS

## 2015-09-24 MED ORDER — ASPIRIN EC 81 MG PO TBEC
81.0000 mg | DELAYED_RELEASE_TABLET | Freq: Every day | ORAL | Status: DC
  Administered 2015-09-24 – 2015-09-26 (×3): 81 mg via ORAL
  Filled 2015-09-24 (×3): qty 1

## 2015-09-24 MED ORDER — NEOMYCIN-POLYMYXIN B GU 40-200000 IR SOLN
Status: AC
  Filled 2015-09-24: qty 20

## 2015-09-24 MED ORDER — BUPIVACAINE LIPOSOME 1.3 % IJ SUSP
INTRAMUSCULAR | Status: AC
  Filled 2015-09-24: qty 20

## 2015-09-24 MED ORDER — ACETAMINOPHEN 500 MG PO TABS
1000.0000 mg | ORAL_TABLET | Freq: Four times a day (QID) | ORAL | Status: AC
  Administered 2015-09-24 – 2015-09-25 (×3): 1000 mg via ORAL
  Filled 2015-09-24 (×3): qty 2

## 2015-09-24 MED ORDER — VITAMIN E 180 MG (400 UNIT) PO CAPS
400.0000 [IU] | ORAL_CAPSULE | Freq: Every day | ORAL | Status: DC
  Administered 2015-09-25 – 2015-09-26 (×2): 400 [IU] via ORAL
  Filled 2015-09-24 (×3): qty 1

## 2015-09-24 MED ORDER — ENOXAPARIN SODIUM 30 MG/0.3ML ~~LOC~~ SOLN
30.0000 mg | Freq: Two times a day (BID) | SUBCUTANEOUS | Status: DC
Start: 2015-09-24 — End: 2015-09-26
  Administered 2015-09-24 – 2015-09-26 (×4): 30 mg via SUBCUTANEOUS
  Filled 2015-09-24 (×4): qty 0.3

## 2015-09-24 MED ORDER — TRANEXAMIC ACID 1000 MG/10ML IV SOLN
INTRAVENOUS | Status: DC | PRN
  Administered 2015-09-24: 1000 mg

## 2015-09-24 MED ORDER — SODIUM CHLORIDE 0.9 % IV SOLN
INTRAVENOUS | Status: DC | PRN
  Administered 2015-09-24: 16:00:00 via INTRAVENOUS

## 2015-09-24 MED ORDER — LEVOTHYROXINE SODIUM 75 MCG PO TABS
75.0000 ug | ORAL_TABLET | Freq: Every day | ORAL | Status: DC
  Administered 2015-09-24 – 2015-09-26 (×2): 75 ug via ORAL
  Filled 2015-09-24 (×3): qty 1

## 2015-09-24 MED ORDER — PROPOFOL 500 MG/50ML IV EMUL
INTRAVENOUS | Status: DC | PRN
  Administered 2015-09-24: 25 ug/kg/min via INTRAVENOUS

## 2015-09-24 MED ORDER — METOCLOPRAMIDE HCL 5 MG PO TABS: 5.0000 mg | ORAL_TABLET | Freq: Three times a day (TID) | ORAL | Status: DC | PRN

## 2015-09-24 MED ORDER — VANCOMYCIN HCL IN DEXTROSE 1-5 GM/200ML-% IV SOLN
1000.0000 mg | Freq: Two times a day (BID) | INTRAVENOUS | Status: AC
Start: 2015-09-24 — End: 2015-09-24
  Administered 2015-09-24: 1000 mg via INTRAVENOUS
  Filled 2015-09-24: qty 200

## 2015-09-24 MED ORDER — PHENYLEPHRINE HCL 10 MG/ML IJ SOLN
INTRAMUSCULAR | Status: AC
  Filled 2015-09-24: qty 1

## 2015-09-24 MED ORDER — PHENYLEPHRINE HCL 10 MG/ML IJ SOLN
INTRAMUSCULAR | Status: DC | PRN
  Administered 2015-09-24: 50 ug via INTRAVENOUS

## 2015-09-24 MED ORDER — OXYCODONE HCL 5 MG PO TABS
5.0000 mg | ORAL_TABLET | ORAL | Status: DC | PRN
  Administered 2015-09-25: 10 mg via ORAL
  Administered 2015-09-25 (×2): 5 mg via ORAL
  Administered 2015-09-26 (×3): 10 mg via ORAL
  Filled 2015-09-24: qty 2
  Filled 2015-09-24: qty 1
  Filled 2015-09-24 (×3): qty 2
  Filled 2015-09-24: qty 1
  Filled 2015-09-24: qty 2

## 2015-09-24 MED ORDER — VANCOMYCIN HCL IN DEXTROSE 1-5 GM/200ML-% IV SOLN
1000.0000 mg | Freq: Once | INTRAVENOUS | Status: AC
  Administered 2015-09-24: 15:00:00 1000 mg via INTRAVENOUS
  Filled 2015-09-24: qty 200

## 2015-09-24 MED ORDER — BUPIVACAINE-EPINEPHRINE (PF) 0.25% -1:200000 IJ SOLN
INTRAMUSCULAR | Status: AC
  Filled 2015-09-24: qty 30

## 2015-09-24 MED ORDER — HYDROMORPHONE HCL 1 MG/ML IJ SOLN
0.5000 mg | Freq: Once | INTRAMUSCULAR | Status: AC
  Administered 2015-09-24: 05:00:00 0.5 mg via INTRAVENOUS
  Filled 2015-09-24: qty 1

## 2015-09-24 MED ORDER — ONDANSETRON HCL 4 MG PO TABS
4.0000 mg | ORAL_TABLET | Freq: Four times a day (QID) | ORAL | Status: DC | PRN
Start: 2015-09-24 — End: 2015-09-26

## 2015-09-24 MED ORDER — ONDANSETRON HCL 4 MG/2ML IJ SOLN
4.0000 mg | Freq: Four times a day (QID) | INTRAMUSCULAR | Status: DC | PRN
  Administered 2015-09-24: 4 mg via INTRAVENOUS
  Filled 2015-09-24: qty 2

## 2015-09-24 MED ORDER — AMLODIPINE BESYLATE 10 MG PO TABS
10.0000 mg | ORAL_TABLET | Freq: Every day | ORAL | Status: DC
  Administered 2015-09-24 – 2015-09-26 (×4): 10 mg via ORAL
  Filled 2015-09-24 (×3): qty 1

## 2015-09-24 MED ORDER — PANTOPRAZOLE SODIUM 40 MG PO TBEC
40.0000 mg | DELAYED_RELEASE_TABLET | Freq: Every day | ORAL | Status: DC
  Administered 2015-09-24 – 2015-09-26 (×3): 40 mg via ORAL
  Filled 2015-09-24 (×3): qty 1

## 2015-09-24 MED ORDER — BUPIVACAINE LIPOSOME 1.3 % IJ SUSP
INTRAMUSCULAR | Status: DC | PRN
  Administered 2015-09-24: 20 mL

## 2015-09-24 MED ORDER — HYDROMORPHONE HCL 1 MG/ML IJ SOLN
0.5000 mg | INTRAMUSCULAR | Status: DC | PRN
  Administered 2015-09-24 (×4): 0.5 mg via INTRAVENOUS
  Filled 2015-09-24 (×4): qty 1

## 2015-09-24 MED ORDER — NEOMYCIN-POLYMYXIN B GU 40-200000 IR SOLN
Status: DC | PRN
  Administered 2015-09-24: 16 mL

## 2015-09-24 SURGICAL SUPPLY — 57 items
BAG DECANTER FOR FLEXI CONT (MISCELLANEOUS) ×3 IMPLANT
BLADE SAGITTAL WIDE XTHICK NO (BLADE) ×3 IMPLANT
BLADE SURG SZ20 CARB STEEL (BLADE) ×3 IMPLANT
BNDG COHESIVE 6X5 TAN STRL LF (GAUZE/BANDAGES/DRESSINGS) ×3 IMPLANT
BOWL CEMENT MIXING ADV NOZZLE (MISCELLANEOUS) ×3 IMPLANT
CANISTER SUCT 1200ML W/VALVE (MISCELLANEOUS) ×3 IMPLANT
CANISTER SUCT 3000ML (MISCELLANEOUS) ×6 IMPLANT
CAPT HIP HEMI 2 ×2 IMPLANT
CHLORAPREP W/TINT 26ML (MISCELLANEOUS) ×6 IMPLANT
DECANTER SPIKE VIAL GLASS SM (MISCELLANEOUS) ×6 IMPLANT
DRAPE IMP U-DRAPE 54X76 (DRAPES) ×6 IMPLANT
DRAPE INCISE IOBAN 66X60 STRL (DRAPES) ×3 IMPLANT
DRAPE SHEET LG 3/4 BI-LAMINATE (DRAPES) ×3 IMPLANT
DRAPE SURG 17X23 STRL (DRAPES) ×3 IMPLANT
DRSG OPSITE POSTOP 4X12 (GAUZE/BANDAGES/DRESSINGS) ×3 IMPLANT
DRSG OPSITE POSTOP 4X14 (GAUZE/BANDAGES/DRESSINGS) ×3 IMPLANT
ELECT BLADE 6.5 EXT (BLADE) ×3 IMPLANT
ELECT CAUTERY BLADE 6.4 (BLADE) ×3 IMPLANT
ELECT REM PT RETURN 9FT ADLT (ELECTROSURGICAL) ×3
ELECTRODE REM PT RTRN 9FT ADLT (ELECTROSURGICAL) ×1 IMPLANT
GAUZE PACK 2X3YD (MISCELLANEOUS) ×3 IMPLANT
GLOVE BIO SURGEON STRL SZ8 (GLOVE) ×6 IMPLANT
GLOVE INDICATOR 8.0 STRL GRN (GLOVE) ×3 IMPLANT
GOWN STRL REUS W/ TWL LRG LVL3 (GOWN DISPOSABLE) ×1 IMPLANT
GOWN STRL REUS W/ TWL XL LVL3 (GOWN DISPOSABLE) ×1 IMPLANT
GOWN STRL REUS W/TWL LRG LVL3 (GOWN DISPOSABLE) ×3
GOWN STRL REUS W/TWL XL LVL3 (GOWN DISPOSABLE) ×3
HANDPIECE SUCTION TUBG SURGILV (MISCELLANEOUS) ×3 IMPLANT
HOOD PEEL AWAY FLYTE STAYCOOL (MISCELLANEOUS) ×6 IMPLANT
IV NS 100ML SINGLE PACK (IV SOLUTION) ×3 IMPLANT
NDL FILTER BLUNT 18X1 1/2 (NEEDLE) ×1 IMPLANT
NDL SAFETY 18GX1.5 (NEEDLE) ×3 IMPLANT
NDL SPNL 20GX3.5 QUINCKE YW (NEEDLE) ×1 IMPLANT
NEEDLE FILTER BLUNT 18X 1/2SAF (NEEDLE) ×2
NEEDLE FILTER BLUNT 18X1 1/2 (NEEDLE) ×1 IMPLANT
NEEDLE SPNL 20GX3.5 QUINCKE YW (NEEDLE) ×3 IMPLANT
NS IRRIG 1000ML POUR BTL (IV SOLUTION) ×3 IMPLANT
PACK HIP PROSTHESIS (MISCELLANEOUS) ×3 IMPLANT
PILLOW ABDUC SM (MISCELLANEOUS) ×3 IMPLANT
SOL .9 NS 3000ML IRR  AL (IV SOLUTION) ×4
SOL .9 NS 3000ML IRR AL (IV SOLUTION) ×2
SOL .9 NS 3000ML IRR UROMATIC (IV SOLUTION) ×2 IMPLANT
STAPLER SKIN PROX 35W (STAPLE) ×3 IMPLANT
STRAP SAFETY BODY (MISCELLANEOUS) ×3 IMPLANT
SUT ETHIBOND #5 BRAIDED 30INL (SUTURE) ×3 IMPLANT
SUT ETHIBOND 2 V 37 (SUTURE) ×6 IMPLANT
SUT ETHIBOND CT1 BRD #0 30IN (SUTURE) ×3 IMPLANT
SUT QUILL PDO 2 24X24 VLT (SUTURE) ×3 IMPLANT
SUT VIC AB 0 CT1 27 (SUTURE) ×6
SUT VIC AB 0 CT1 27XCR 8 STRN (SUTURE) ×2 IMPLANT
SUT VIC AB 1 CT1 36 (SUTURE) ×3 IMPLANT
SUT VIC AB 2-0 CT1 27 (SUTURE) ×6
SUT VIC AB 2-0 CT1 TAPERPNT 27 (SUTURE) ×2 IMPLANT
SYR 30ML LL (SYRINGE) ×3 IMPLANT
SYR TB 1ML 27GX1/2 LL (SYRINGE) ×3 IMPLANT
SYRINGE 10CC LL (SYRINGE) ×3 IMPLANT
TAPE TRANSPORE STRL 2 31045 (GAUZE/BANDAGES/DRESSINGS) ×3 IMPLANT

## 2015-09-24 NOTE — Consult Note (Signed)
ORTHOPAEDIC CONSULTATION  REQUESTING PHYSICIAN: Henreitta Leber, MD  Chief Complaint:   Right hip pain.  History of Present Illness: Mary [name] is a 81 y.o. female with a history of diabetes, sleep apnea, hypertension, depression, GERD, hyperlipidemia, and osteoarthritis who lives at home with her husband. Apparently she, she tripped over her husband's oxygen tubing and fell onto her right side, injuring her right hip. She was unable to get up. She was brought to the emergency room where x-rays demonstrated a displaced right femoral neck fracture. She was admitted in preparation for definitive management of this injury. The patient denies any associated injuries, and denies any loss of consciousness or striking her head. She also denies any lightheadedness, dizziness, shortness of breath, or other symptoms that may have predisposed her to falling.  Past Medical History  Diagnosis Date  . Diabetes ([place])   . Acid reflux   . Arthritis   . Depression   . Heart murmur   . HTN (hypertension)   . HLD (hyperlipidemia)   . Sleep apnea    Past Surgical History  Procedure Laterality Date  . Abdominal hysterectomy    . Varicose vein surgery    . Cystoscopy    . Knee arthroscopy w/ autogenous cartilage implantation (aci) procedure     Social History   Social History  . Marital Status: Married    Spouse Name: N/A  . Number of Children: N/A  . Years of Education: N/A   Social History Main Topics  . Smoking status: Never Smoker   . Smokeless tobacco: None  . Alcohol Use: No  . Drug Use: No  . Sexual Activity: Not Asked   Other Topics Concern  . None   Social History Narrative   Family History  Problem Relation Age of Onset  . Kidney disease Brother     also nephew  . Prostate cancer Neg Hx   . Bladder Cancer Neg Hx   . Breast cancer Neg Hx    Allergies  Allergen Reactions  . Cephalexin Hives  .  Nitrofurantoin     Other reaction(s): Other (See Comments) Other Reaction: measles-like lesions  . Other     Other reaction(s): Other (See Comments) Uncoded Allergy. Allergen: microdantin, Other Reaction: Measle-like Lesions  . Sulfa Antibiotics Hives  . Atorvastatin Other (See Comments)    Muscle spasms   Prior to Admission medications   Medication Sig Start Date End Date Taking? Authorizing Provider  amLODipine (NORVASC) 10 MG tablet Take 10 mg by mouth daily.    Yes Historical Provider, MD  aspirin 81 MG tablet Take 81 mg by mouth daily.    Yes Historical Provider, MD  furosemide (LASIX) 20 MG tablet Take 20 mg by mouth daily.    Yes Historical Provider, MD  hydrochlorothiazide (HYDRODIURIL) 12.5 MG tablet Take 12.5 mg by mouth daily.    Yes Historical Provider, MD  levothyroxine (SYNTHROID, LEVOTHROID) 75 MCG tablet Take 75 mcg by mouth daily.    Yes Historical Provider, MD  losartan (COZAAR) 100 MG tablet Take 100 mg by mouth daily.    Yes Historical Provider, MD  lovastatin (MEVACOR) 40 MG tablet Take 40 mg by mouth at bedtime.    Yes Historical Provider, MD  metFORMIN (GLUCOPHAGE-XR) 500 MG 24 hr tablet Take 500 mg by mouth daily.    Yes Historical Provider, MD  omeprazole (PRILOSEC) 40 MG capsule Take 40 mg by mouth daily.    Yes Historical Provider, MD  PARoxetine (PAXIL) 40 MG  tablet Take by mouth daily.  08/13/14 09/24/15 Yes Historical Provider, MD  solifenacin (VESICARE) 10 MG tablet Take 1 tablet (10 mg total) by mouth daily. 05/20/15  Yes Shannon A McGowan, PA-C  vitamin B-12 (CYANOCOBALAMIN) 1000 MCG tablet Take 1,000 mcg by mouth daily.   Yes Historical Provider, MD  vitamin E 400 UNIT capsule Take 400 Units by mouth daily.    Yes Historical Provider, MD  conjugated estrogens (PREMARIN) vaginal cream Place 1 Applicatorful vaginally daily. Patient not taking: Reported on 09/23/2015 05/20/15   Nori Riis, PA-C   Dg Chest 1 View  09/23/2015  CLINICAL DATA:  [place] at home  tonight. Fell onto right hip. Also hit head. No chest complaints. History of diabetes and hypertension. EXAM: CHEST 1 VIEW COMPARISON:  None. FINDINGS: Cardiac silhouette is normal in size and configuration. No mediastinal or hilar masses or convincing adenopathy. Clear lungs.  No pleural effusion or pneumothorax. Bony thorax is grossly intact. IMPRESSION: No active disease. Electronically Signed   By: Lajean Manes M.D.   On: 09/23/2015 20:55   Dg Hip Unilat With Pelvis 2-3 Views Right  09/23/2015  CLINICAL DATA:  [place] at home tonight. Patient tripped coming out of the bathroom. Fell onto right hip. Complaining of right hip pain. EXAM: DG HIP (WITH OR WITHOUT PELVIS) 2-3V RIGHT COMPARISON:  None. FINDINGS: There is a fracture of the right femoral neck, mid cervical. Distal fracture component has displaced superiorly in relation to the femoral head component, by approximately 1.8 cm. There is significant varus angulation as well as apex anterior angulation. No significant comminution. No other fractures. The right femoral head is normally aligned with the acetabulum. SI joints, symphysis pubis and left hip joint are normally aligned. IMPRESSION: 1. Mildly displaced, angulated fracture of the right femoral neck. Electronically Signed   By: Lajean Manes M.D.   On: 09/23/2015 20:57    Positive ROS: All other systems have been reviewed and were otherwise negative with the exception of those mentioned in the HPI and as above.  Physical Exam: General:  Alert, no acute distress Psychiatric:  Patient is competent for consent with normal mood and affect   Cardiovascular:  No pedal edema Respiratory:  No wheezing, non-labored breathing GI:  Abdomen is soft and non-tender Skin:  No lesions in the area of chief complaint Neurologic:  Sensation intact distally Lymphatic:  No axillary or cervical lymphadenopathy  Orthopedic Exam:  Orthopedic examination is limited to the right hip and lower extremity. Skin  inspection around the right hip is unremarkable. The right lower extremity is somewhat shortened and externally rotated as compared to the left. She has reproduction of her right hip pain with any attempted active or passive motion of the hip. She is neurovascularly intact to the right foot and lower extremity, demonstrating the ability to actively dorsiflex and plantarflex her ankles and toes. Sensation is intact to light touch to all distributions. She has good capillary refill to her right foot.  X-rays:  X-rays of the pelvis and right hip are available for review. These films demonstrate a displaced right femoral neck fracture. No significant degenerative changes are noted. No lytic lesions are identified.  Assessment: Displaced right femoral neck fracture.  Plan: The treatment options were discussed with the patient and her family, who are at the bedside. The patient would like to proceed with a right hip hemiarthroplasty, the recommended procedure. This procedure has been discussed in detail, as have the potential risks (including bleeding, infection, nerve  and/or blood vessel injury, persistent or recurrent pain, stiffness, leg length inequality, dislocation, loosening of and/or failure of the components, need for further surgery, blood clots, strokes, heart attacks and/or arrhythmias, etc.) and benefits. The patient states her understanding and wishes to proceed. A formal written consent will be obtained by the nursing staff. It is hoped that we can do this procedure this afternoon as she has been cleared medically.  Thank you for asking me to participate in the care of this most pleasant woman. I will be happy to follow her with you.   Pascal Lux, MD  Beeper #:  [phone]  09/24/2015 1:14 PM

## 2015-09-24 NOTE — Progress Notes (Signed)
[facility] [place] NAME: Mary [name]    MR#:  [id number]  DATE OF BIRTH:  1935/07/24  SUBJECTIVE:   Here due to a fall and noted to have a right hip fracture. Patient going to the operating room later today. Complaining of pain in her right hip area and lower back. Family at bedside.   REVIEW OF SYSTEMS:    Review of Systems  Constitutional: Negative for fever and chills.  HENT: Negative for congestion and tinnitus.   Eyes: Negative for blurred vision and double vision.  Respiratory: Negative for cough, shortness of breath and wheezing.   Cardiovascular: Negative for chest pain, orthopnea and PND.  Gastrointestinal: Negative for nausea, vomiting, abdominal pain and diarrhea.  Genitourinary: Negative for dysuria and hematuria.  Musculoskeletal: Positive for joint pain (Right Hip/Lower Back) and falls.  Neurological: Negative for dizziness, sensory change and focal weakness.  All other systems reviewed and are negative.   Nutrition: NPO for surgery Tolerating Diet: No Tolerating PT: Await Eval after surgery.    DRUG ALLERGIES:   Allergies  Allergen Reactions  . Cephalexin Hives  . Nitrofurantoin     Other reaction(s): Other (See Comments) Other Reaction: measles-like lesions  . Other     Other reaction(s): Other (See Comments) Uncoded Allergy. Allergen: microdantin, Other Reaction: Measle-like Lesions  . Sulfa Antibiotics Hives  . Atorvastatin Other (See Comments)    Muscle spasms    VITALS:  Blood pressure 164/51, pulse 68, temperature 98.5 F (36.9 C), temperature source Oral, resp. rate 17, height 5\' 6"  (1.676 m), weight 90.311 kg (199 lb 1.6 oz), SpO2 92 %.  PHYSICAL EXAMINATION:   Physical Exam  GENERAL:  81 y.o.-year-old patient lying in the bed in no acute distress.  EYES: Pupils equal, round, reactive to light and accommodation. No scleral icterus. Extraocular muscles intact.  HEENT: Head atraumatic,  normocephalic. Oropharynx and nasopharynx clear.  NECK:  Supple, no jugular venous distention. No thyroid enlargement, no tenderness.  LUNGS: Normal breath sounds bilaterally, no wheezing, rales, rhonchi. No use of accessory muscles of respiration.  CARDIOVASCULAR: S1, S2 normal. No murmurs, rubs, or gallops.  ABDOMEN: Soft, nontender, nondistended. Bowel sounds present. No organomegaly or mass.  EXTREMITIES: No cyanosis, clubbing or edema b/l.  RLE externally rotated and shortened due to fracture. NEUROLOGIC: Cranial nerves II through XII are intact. No focal Motor or sensory deficits b/l.   PSYCHIATRIC: The patient is alert and oriented x 3. Good affect.  SKIN: No obvious rash, lesion, or ulcer.    LABORATORY PANEL:   CBC  Recent Labs Lab 09/24/15 0534  WBC 10.4  HGB 11.7*  HCT 36.1  PLT 151   ------------------------------------------------------------------------------------------------------------------  Chemistries   Recent Labs Lab 09/24/15 0534  NA 137  K 3.7  CL 104  CO2 30  GLUCOSE 169*  BUN 10  CREATININE 0.53  CALCIUM 8.8*   ------------------------------------------------------------------------------------------------------------------  Cardiac Enzymes No results for input(s): TROPONINI in the last 168 hours. ------------------------------------------------------------------------------------------------------------------  RADIOLOGY:  Dg Chest 1 View  09/23/2015  CLINICAL DATA:  [place] at home tonight. Fell onto right hip. Also hit head. No chest complaints. History of diabetes and hypertension. EXAM: CHEST 1 VIEW COMPARISON:  None. FINDINGS: Cardiac silhouette is normal in size and configuration. No mediastinal or hilar masses or convincing adenopathy. Clear lungs.  No pleural effusion or pneumothorax. Bony thorax is grossly intact. IMPRESSION: No active disease. Electronically Signed   By: Dedra Skeens.D.  On: 09/23/2015 20:55   Dg Hip Unilat With  Pelvis 2-3 Views Right  09/23/2015  CLINICAL DATA:  [place] at home tonight. Patient tripped coming out of the bathroom. Fell onto right hip. Complaining of right hip pain. EXAM: DG HIP (WITH OR WITHOUT PELVIS) 2-3V RIGHT COMPARISON:  None. FINDINGS: There is a fracture of the right femoral neck, mid cervical. Distal fracture component has displaced superiorly in relation to the femoral head component, by approximately 1.8 cm. There is significant varus angulation as well as apex anterior angulation. No significant comminution. No other fractures. The right femoral head is normally aligned with the acetabulum. SI joints, symphysis pubis and left hip joint are normally aligned. IMPRESSION: 1. Mildly displaced, angulated fracture of the right femoral neck. Electronically Signed   By: Lajean Manes M.D.   On: 09/23/2015 20:57     ASSESSMENT AND PLAN:   81 year old female with past medical history of diabetes, GERD, osteoarthritis, depression, hypertension, hyperlipidemia who presented to the hospital after a mechanical fall and noted to have a right hip fracture.  #1 right hip fracture-due to mechanical fall. -Patient has been cleared from a medical perspective for surgery and is going to the OR later today. -Continue pain control and care as per orthopedics.  #2 hypertension-hemodynamically stable. -Continue losartan, Norvasc, Lasix.  #3 hypothyroidism-continue Synthroid.  #4 GERD-continue Protonix.  #5 diabetes type 2 without complication-hold metformin. Continue sliding scale insulin.  #6 Anxiety - will resume Paxil.     All the records are reviewed and case discussed with Care Management/Social Workerr. Management plans discussed with the patient, family and they are in agreement.  CODE STATUS: Full  DVT Prophylaxis: Will start post-surgery  TOTAL TIME TAKING CARE OF THIS PATIENT: 25 minutes.   POSSIBLE D/C IN 1-2 DAYS, DEPENDING ON CLINICAL CONDITION.   Henreitta Leber M.D on  09/24/2015 at 2:15 PM  Between 7am to 6pm - Pager - [phone]  After 6pm go to [email or web address] - password [email or web address] [facility]  Office  [phone]  CC: Primary care physician; Dion Body, MD

## 2015-09-24 NOTE — Progress Notes (Signed)
Pt signed consent for right hip hemiarthroplasty

## 2015-09-24 NOTE — Progress Notes (Signed)
Pain medication given for right hip pain-vancomycin started-transported to OR

## 2015-09-24 NOTE — Transfer of Care (Signed)
Immediate Anesthesia Transfer of Care Note  Patient: Mary [name]  Procedure(s) Performed: Procedure(s): ARTHROPLASTY BIPOLAR HIP (HEMIARTHROPLASTY) (Right)  Patient Location: PACU  Anesthesia Type:Spinal  Level of Consciousness: awake, alert , oriented and patient cooperative  Airway & Oxygen Therapy: Patient Spontanous Breathing and Patient connected to face mask oxygen  Post-op Assessment: Report given to RN and Post -op Vital signs reviewed and stable  Post vital signs: Reviewed and stable  Last Vitals:  Filed Vitals:   09/24/15 1504 09/24/15 1852  BP: 199/65 168/66  Pulse:  73  Temp:  36.9 C  Resp:  17    Complications: No apparent anesthesia complications

## 2015-09-24 NOTE — Plan of Care (Signed)
Problem: Pain Managment: Goal: General experience of comfort will improve Outcome: Not Progressing Pt continues to experience high levels of pain in the right hip. A one time order of Dilaudid 0.5 mg IV ordered, continue to assess.

## 2015-09-24 NOTE — Progress Notes (Signed)
   09/24/15 0800  Clinical Encounter Type  Visited With Patient and family together  Visit Type Initial  Referral From Nurse  Consult/Referral To Chaplain  Spiritual Encounters  Spiritual Needs Prayer  Stress Factors  Patient Stress Factors Exhausted;Health changes  Family Stress Factors Major life changes  Met w/patient & family. Provided pastoral care & prayer. Chap. Victoire Deans G. [place]

## 2015-09-24 NOTE — OR Nursing (Signed)
Patient with patient IV in left hand and IV infusing in that hand.

## 2015-09-24 NOTE — ED Notes (Signed)
Gave report to [place], Therapist, sports. BP is too high at this point to take to floor. Spoke with Dr. Jannifer Franklin. Will give Amlodipine. If BP still up in 20-25 minutes, will give Hydralazine.

## 2015-09-24 NOTE — Op Note (Signed)
09/23/2015 - 09/24/2015  6:52 PM  Patient:   Mary [name]  Pre-Op Diagnosis:   Displaced femoral neck fracture, right hip.  Post-Op Diagnosis:   Same.  Procedure:   Right hip unipolar hemiarthroplasty.  Surgeon:   Pascal Lux, MD  Assistant:   Cameron Proud, PA-C; Amber Race, PA-S  Anesthesia:   Spinal  Findings:   As above.  Complications:   None  EBL:   250 cc  Fluids:   1100 cc crystalloid  UOP:   125 cc  TT:   None  Drains:   None  Closure:   Staples  Implants:   Biomet press-fit system with a #11 standard offset Echo femoral stem, a 43 mm outer diameter shell, and a -3 mm neck  Brief Clinical Note:   The patient is a 81 year old female who sustained the above-noted injury last evening when she fell at her home. She was brought to the emergency room x-rays demonstrated the above-noted fracture. The patient has been cleared medically and presents at this time for definitive management of the injury.  Procedure:   The patient was brought into the operating room. After adequate spinal anesthesia was obtained, the patient was repositioned in the left lateral decubitus position and secured using a lateral hip positioner. The right hip and lower extremity were prepped with ChloroPrep solution before being draped sterilely. Preoperative antibiotics were administered. A timeout was performed to verify the appropriate surgical site before a standard posterior approach to the hip was made through an approximately 6-7 inch incision. The incision was carried down through the subcutaneous tissues to expose the gluteal fascia and proximal end of the iliotibial band. These structures were split the length of the incision and the Charnley self-retaining hip retractor placed. The bursal tissues were swept posteriorly to expose the short external rotators. The anterior border of the piriformis tendon was identified and this plane developed down through the capsule to enter the joint.  Abundant fracture hematoma was suctioned. A flap of tissue was elevated off the posterior aspect of the femoral neck and greater trochanter and retracted posteriorly. This flap included the piriformis tendon, the short external rotators, and the posterior capsule. The femoral head was removed in its entirety, then taken to the back table where it was measured and found to be optimally replicated by a 43 mm head. The appropriate trial head was inserted and found to demonstrate an excellent suction fit.   Attention was directed to the femoral side. The femoral neck was recut 10-12 mm above the lesser trochanter using an oscillating saw. The piriformis fossa was debrided of soft tissues before the intramedullary canal was accessed through this point using a triple step reamer. The canal was reamed sequentially beginning with a #7 tapered reamer and progressing to a #11 tapered reamer. This provided excellent circumferential chatter. A box osteotome was used to establish version before the canal was broached sequentially beginning with a #7 broach and progressing to a #11 broach. This was left in place and several trial reductions performed. The permanent #11 standard offset femoral stem was impacted into place. A repeat trial reduction was performed using both the -6 mm and -3 mm neck lengths. The -3 mm neck length demonstrated excellent stability both in extension and external rotation as well as with flexion to 90 and internal rotation beyond 70. It also was stable in the position of sleep. The 43 mm outer diameter shell and -3 mm neck adapter construct was put together  on the back table before being impacted onto the stem of the femoral component. The Morse taper locking mechanism was verified using manual distraction before the head was relocated and placed through a range of motion with the findings as described above.  The wound was copiously irrigated with bacitracin saline solution via the jet lavage  system before the peri-incisional and pericapsular tissues were injected with 30 cc of 0.5% Sensorcaine with epinephrine and 20 cc of Exparel diluted out to 60 cc with normal saline to help with postoperative analgesia. The posterior flap was reapproximated to the posterior aspect of the greater trochanter using #2 Tycron interrupted sutures placed through drill holes. Several additional #2 Tycron interrupted sutures were used to reinforce this layer of closure. The iliotibial band was reapproximated using #1 Vicryl interrupted sutures before the gluteal fascia was closed using a running #1 Vicryl suture. At this point, 1 g of transexemic acid in 10 cc of normal saline was injected into the joint to help reduce postoperative bleeding. The subcutaneous tissues were closed in several layers using 2-0 Vicryl interrupted sutures before the skin was closed using staples. A sterile occlusive dressing was applied to the wound before the patient was placed into an abduction wedge pillow. The patient was then rolled back into the supine position on the hospital bed before being awakened and returned to the recovery room in satisfactory condition after tolerating the procedure well.

## 2015-09-24 NOTE — Anesthesia Preprocedure Evaluation (Addendum)
Anesthesia Evaluation  Patient identified by MRN, date of birth, ID band Patient awake    Reviewed: Allergy & Precautions, NPO status , Patient's Chart, lab work & pertinent test results  Airway Mallampati: III  TM Distance: >3 FB     Dental  (+) Chipped   Pulmonary sleep apnea ,    Pulmonary exam normal        Cardiovascular hypertension, Pt. on medications Normal cardiovascular exam+ Valvular Problems/Murmurs      Neuro/Psych Depression    GI/Hepatic Neg liver ROS, GERD  Medicated and Controlled,  Endo/Other  diabetes, Well Controlled, Type 2, Oral Hypoglycemic AgentsHypothyroidism   Renal/GU negative Renal ROS     Musculoskeletal  (+) Arthritis , Osteoarthritis,  Fx of hip   Abdominal Normal abdominal exam  (+)   Peds negative pediatric ROS (+)  Hematology   Anesthesia Other Findings   Reproductive/Obstetrics                            Anesthesia Physical Anesthesia Plan  ASA: III  Anesthesia Plan: Spinal   Post-op Pain Management:    Induction: Intravenous  Airway Management Planned: Nasal Cannula  Additional Equipment:   Intra-op Plan:   Post-operative Plan:   Informed Consent: I have reviewed the patients History and Physical, chart, labs and discussed the procedure including the risks, benefits and alternatives for the proposed anesthesia with the patient or authorized representative who has indicated his/her understanding and acceptance.   Dental advisory given  Plan Discussed with: CRNA and Surgeon  Anesthesia Plan Comments:         Anesthesia Quick Evaluation

## 2015-09-24 NOTE — Anesthesia Procedure Notes (Signed)
Spinal Patient location during procedure: OR Start time: 09/24/2015 4:38 PM End time: 09/24/2015 4:44 PM Staffing Anesthesiologist: Alvin Critchley Performed by: anesthesiologist  Preanesthetic Checklist Completed: patient identified, site marked, surgical consent, pre-op evaluation, timeout performed, IV checked, risks and benefits discussed and monitors and equipment checked Spinal Block Patient position: sitting Prep: Betadine and site prepped and draped Patient monitoring: heart rate, cardiac monitor, continuous pulse ox and blood pressure Approach: midline Location: L3-4 Injection technique: single-shot Needle Needle type: Quincke  Needle gauge: 25 G Additional Notes Time out called.  Patient mildly sedated and placed in the sitting position.  Prepped and draped in sterile fashion.  A skin wheal was made in the L3-L4 interspace with 1% Lidocaine plain.  A 25G Quincke needle was advanced with the return of clear, colorless CSF in all 4 Quadrants.  10 mg of Marcaine + 5 mg of Tetracaine + 1: 200K epi wash injected into CSF.  Patient tolerated the procedure well.  Level tested to T8T8 by pin prick and cold.

## 2015-09-24 NOTE — Care Management Note (Addendum)
Case Management Note  Patient Details  Name: Mary [name] MRN: [id number] Date of Birth: [birth year]  Subjective/Objective:                  Met with patient prior to surgery to discuss discharge planning. She does not have any DME in the home that she can use. She lives with her husband that recently discharged from the hospital. She tripped over his O2 tubing. They care for their daughter Quina Wilbourne [phone] that has CP. [name] has [employer] caregivers that come into the home and do light house duty. Patient would like to return home and was not interested in SNF at all. She asked that [facility] referral for home health be made. She uses [facility] for Rx : [phone]. She has two sisters that have agreed to helping patient in the home. I spoke with Enis Gash [phone] and the other sister is Collie Siad [phone].She states her daughter and husband have 3 in 1 toilet. O2 is new to patient.  Action/Plan: List of home health care agencies left with patient along with my business card. I have sent referral to [facility] with [facility] [phone] with anticipated discharge on 09/26/15 or 09/27/15. Rolling walker requested for delivery after PT works with patient- [place]. RNCM to follow for Lovenox, DME, and home health.   Expected Discharge Date:                  Expected Discharge Plan:     In-House Referral:     Discharge planning Services  CM Consult  Post Acute Care Choice:  Durable Medical Equipment, Home Health Choice offered to:  Patient  DME Arranged:  Walker rolling DME Agency:  [place]:  PT [place]:  [place]  Status of Service:  In process, will continue to follow  Medicare Important Message Given:    Date Medicare IM Given:    Medicare IM give by:    Date Additional Medicare IM Given:    Additional Medicare Important Message give by:     If discussed at [place] of Stay Meetings, dates discussed:     Additional Comments: [facility] has accepted this patient per [place].  Marshell Garfinkel, RN 09/24/2015, 8:58 AM

## 2015-09-25 ENCOUNTER — Encounter: Payer: Self-pay | Admitting: Surgery

## 2015-09-25 LAB — CBC WITH DIFFERENTIAL/PLATELET
BASOS ABS: 0 10*3/uL (ref 0–0.1)
BASOS PCT: 0 %
EOS ABS: 0.2 10*3/uL (ref 0–0.7)
EOS PCT: 2 %
HCT: 32.1 % — ABNORMAL LOW (ref 35.0–47.0)
Hemoglobin: 10.3 g/dL — ABNORMAL LOW (ref 12.0–16.0)
LYMPHS PCT: 9 %
Lymphs Abs: 0.9 10*3/uL — ABNORMAL LOW (ref 1.0–3.6)
MCH: 28.9 pg (ref 26.0–34.0)
MCHC: 32.1 g/dL (ref 32.0–36.0)
MCV: 89.9 fL (ref 80.0–100.0)
Monocytes Absolute: 0.7 10*3/uL (ref 0.2–0.9)
Monocytes Relative: 7 %
Neutro Abs: 7.8 10*3/uL — ABNORMAL HIGH (ref 1.4–6.5)
Neutrophils Relative %: 82 %
PLATELETS: 126 10*3/uL — AB (ref 150–440)
RBC: 3.57 MIL/uL — AB (ref 3.80–5.20)
RDW: 15.2 % — ABNORMAL HIGH (ref 11.5–14.5)
WBC: 9.6 10*3/uL (ref 3.6–11.0)

## 2015-09-25 LAB — GLUCOSE, CAPILLARY
GLUCOSE-CAPILLARY: 136 mg/dL — AB (ref 65–99)
GLUCOSE-CAPILLARY: 152 mg/dL — AB (ref 65–99)
Glucose-Capillary: 152 mg/dL — ABNORMAL HIGH (ref 65–99)
Glucose-Capillary: 153 mg/dL — ABNORMAL HIGH (ref 65–99)

## 2015-09-25 LAB — BASIC METABOLIC PANEL
ANION GAP: 3 — AB (ref 5–15)
BUN: 7 mg/dL (ref 6–20)
CALCIUM: 8.7 mg/dL — AB (ref 8.9–10.3)
CO2: 31 mmol/L (ref 22–32)
Chloride: 104 mmol/L (ref 101–111)
Creatinine, Ser: 0.43 mg/dL — ABNORMAL LOW (ref 0.44–1.00)
Glucose, Bld: 127 mg/dL — ABNORMAL HIGH (ref 65–99)
POTASSIUM: 3.7 mmol/L (ref 3.5–5.1)
SODIUM: 138 mmol/L (ref 135–145)

## 2015-09-25 NOTE — NC FL2 (Signed)
[place] LEVEL OF CARE SCREENING TOOL     IDENTIFICATION  Patient Name: Mary [name] Birthdate: [birth year] Sex: female Admission Date (Current Location): 09/23/2015  [place] and [place] Number:  Engineering geologist and Address:  [facility], [address] [place], [place] [address]      Provider Number: [id number]  Attending Physician Name and Address:  Henreitta Leber, MD  Relative Name and Phone Number:       Current Level of Care: Hospital Recommended Level of Care: [place] Prior Approval Number:    Date Approved/Denied:   PASRR Number:  ([id number])  Discharge Plan: SNF    Current Diagnoses: Patient Active Problem List   Diagnosis Date Noted  . Fracture of femoral neck, right ([place]) 09/23/2015  . Type 2 diabetes mellitus ([place]) 09/23/2015  . HTN (hypertension) 09/23/2015  . HLD (hyperlipidemia) 09/23/2015  . GERD (gastroesophageal reflux disease) 09/23/2015  . Depression 09/23/2015  . Hypothyroidism 09/23/2015  . Nocturia 04/29/2015  . Urinary frequency 04/29/2015  . Atrophic vaginitis 04/29/2015    Orientation RESPIRATION BLADDER Height & Weight     Self, Situation, Place, Time  O2 (2 Liters Oxygen ) Continent, Indwelling catheter Weight: 199 lb 1.6 oz (90.311 kg) Height:  5\' 6"  (167.6 cm)  BEHAVIORAL SYMPTOMS/MOOD NEUROLOGICAL BOWEL NUTRITION STATUS   (none )  (none ) Continent Diet (Regular Diet )  AMBULATORY STATUS COMMUNICATION OF NEEDS Skin   Extensive Assist Verbally Surgical wounds (Incision: Right Hip. )                       Personal Care Assistance Level of Assistance  Bathing, Feeding, Dressing Bathing Assistance: Limited assistance Feeding assistance: Independent Dressing Assistance: Limited assistance     Functional Limitations Info  Hearing, Sight, Speech Sight Info: Adequate Hearing Info: Adequate Speech Info: Adequate    SPECIAL CARE FACTORS FREQUENCY  PT (By  licensed PT), OT (By licensed OT)     PT Frequency:  (5) OT Frequency:  (5)            Contractures      Additional Factors Info  Code Status, Allergies, Insulin Sliding Scale Code Status Info:  (Full Code. ) Allergies Info:  (Cephalexin, Nitrofurantoin, Sulfa Antibiotics, Atorvastatin)   Insulin Sliding Scale Info:  (NovoLog Insulin Injections )       Current Medications (09/25/2015):  This is the current hospital active medication list Current Facility-Administered Medications  Medication Dose Route Frequency Provider Last Rate Last Dose  . 0.9 % NaCl with KCl 20 mEq/ L  infusion   Intravenous Continuous Corky Mull, MD 100 mL/hr at 09/24/15 2219    . acetaminophen (TYLENOL) tablet 650 mg  650 mg Oral Q6H PRN Corky Mull, MD       Or  . acetaminophen (TYLENOL) suppository 650 mg  650 mg Rectal Q6H PRN Corky Mull, MD      . acetaminophen (TYLENOL) tablet 1,000 mg  1,000 mg Oral 4 times per day Corky Mull, MD   1,000 mg at 09/25/15 0334  . amLODipine (NORVASC) tablet 10 mg  10 mg Oral Daily Lance Coon, MD   10 mg at 09/24/15 0907  . aspirin EC tablet 81 mg  81 mg Oral Daily Lance Coon, MD   81 mg at 09/24/15 0907  . bisacodyl (DULCOLAX) suppository 10 mg  10 mg Rectal Daily PRN Corky Mull, MD      .  diphenhydrAMINE (BENADRYL) 12.5 MG/5ML elixir 12.5-25 mg  12.5-25 mg Oral Q4H PRN Corky Mull, MD      . docusate sodium (COLACE) capsule 100 mg  100 mg Oral BID Corky Mull, MD   100 mg at 09/24/15 2244  . enoxaparin (LOVENOX) injection 30 mg  30 mg Subcutaneous Q12H Corky Mull, MD   30 mg at 09/24/15 2218  . furosemide (LASIX) tablet 20 mg  20 mg Oral Daily Lance Coon, MD   20 mg at 09/24/15 0907  . hydrALAZINE (APRESOLINE) injection 10 mg  10 mg Intravenous Q4H PRN Lance Coon, MD   10 mg at 09/24/15 0219  . HYDROmorphone (DILAUDID) injection 0.5-1 mg  0.5-1 mg Intravenous Q2H PRN Corky Mull, MD   0.5 mg at 09/25/15 0453  . insulin aspart (novoLOG) injection 0-9  Units  0-9 Units Subcutaneous Q6H Lance Coon, MD   1 Units at 09/25/15 0310  . levothyroxine (SYNTHROID, LEVOTHROID) tablet 75 mcg  75 mcg Oral QAC breakfast Lance Coon, MD   75 mcg at 09/24/15 0907  . losartan (COZAAR) tablet 100 mg  100 mg Oral Daily Lance Coon, MD   100 mg at 09/24/15 0907  . magnesium hydroxide (MILK OF MAGNESIA) suspension 30 mL  30 mL Oral Daily PRN Corky Mull, MD      . metoCLOPramide (REGLAN) tablet 5-10 mg  5-10 mg Oral Q8H PRN Corky Mull, MD       Or  . metoCLOPramide (REGLAN) injection 5-10 mg  5-10 mg Intravenous Q8H PRN Corky Mull, MD      . ondansetron [facility]) tablet 4 mg  4 mg Oral Q6H PRN Corky Mull, MD       Or  . ondansetron [facility]) injection 4 mg  4 mg Intravenous Q6H PRN Corky Mull, MD   4 mg at 09/24/15 2021  . oxyCODONE (Oxy IR/ROXICODONE) immediate release tablet 5-10 mg  5-10 mg Oral Q3H PRN Corky Mull, MD   5 mg at 09/25/15 0305  . pantoprazole (PROTONIX) EC tablet 40 mg  40 mg Oral QAC breakfast Lance Coon, MD   40 mg at 09/24/15 0906  . PARoxetine (PAXIL) tablet 40 mg  40 mg Oral Daily Henreitta Leber, MD   40 mg at 09/24/15 2216  . pravastatin (PRAVACHOL) tablet 40 mg  40 mg Oral q1800 Corky Mull, MD      . sodium phosphate (FLEET) 7-19 GM/118ML enema 1 enema  1 enema Rectal Once PRN Corky Mull, MD      . vitamin B-12 (CYANOCOBALAMIN) tablet 1,000 mcg  1,000 mcg Oral Daily Henreitta Leber, MD      . vitamin E capsule 400 Units  400 Units Oral Daily Henreitta Leber, MD         Discharge Medications: Please see discharge summary for a list of discharge medications.  Relevant Imaging Results:  Relevant Lab Results:   Additional Information  (SSN: [id number])  Loralyn Freshwater, LCSW

## 2015-09-25 NOTE — Clinical Social Work Note (Signed)
Clinical Social Work Assessment  Patient Details  Name: Mary [name] MRN: [id number] Date of Birth: 07/11/1935  Date of referral:  09/25/15               Reason for consult:  Facility Placement                Permission sought to share information with:  Facility Contact Representative Permission granted to share information::  Yes, Verbal Permission Granted  Name::      [facility]  Agency::   Skilled Nursing Facility   Relationship::     Contact Information:     Housing/Transportation Living arrangements for the past 2 months:  Single Family Home Source of Information:  Patient, Other (Comment Required) (Sibling ) Patient Interpreter Needed:  None Criminal Activity/Legal Involvement Pertinent to Current Situation/Hospitalization:  No - Comment as needed Significant Relationships:  Adult Children, Siblings, Spouse Lives with:  Adult Children, Spouse Do you feel safe going back to the place where you live?  Yes Need for family participation in patient care:  Yes (Comment)  Care giving concerns:  Patient lives in [place] with her special needs adult daughter [name] Raneri and husband [place].    Social Worker assessment / plan:  Clinical Social Worker (CSW) met with patient to discuss D/C plan. Patient was alert and oriented and lying in the bed. Patient's sister Sylvia was at bedside. CSW introduced self and explained role of CSW department. Per patient she lives in [place] with her husband and they care caregivers for their daughter with cerebral palsy. Patient's sister Sylvia reported that she lives in [place] and can come stay with patient and provide support. Per patient she has another sister Sue that lives in [place] that is coming to town this weekend. CSW explained that PT is recommending SNF. Patient is agreeable to SNF search.   CSW presented bed offers. Patient and sister chose [facility]. Kim admissions coordinator at [facility] is aware of accepted offer. CSW will continue  to follow and assist as needed.   Employment status:  Retired Insurance information:  Managed Medicare PT Recommendations:  Skilled Nursing Facility Information / Referral to community resources:  Skilled Nursing Facility  Patient/Family's Response to care: Patient is agreeable to going to [facility].   Patient/Family's Understanding of and Emotional Response to Diagnosis, Current Treatment, and Prognosis:  Patient was pleasant throughout assessment.   Emotional Assessment Appearance:  Appears stated age Attitude/Demeanor/Rapport:    Affect (typically observed):  Accepting, Adaptable, Pleasant Orientation:  Oriented to Self, Oriented to Place, Oriented to  Time, Oriented to Situation Alcohol / Substance use:  Not Applicable Psych involvement (Current and /or in the community):  No (Comment)  Discharge Needs  Concerns to be addressed:  Discharge Planning Concerns Readmission within the last 30 days:  No Current discharge risk:  Dependent with Mobility Barriers to Discharge:  Continued Medical Work up   Morgan, Bailey G, LCSW 09/25/2015, 1:42 PM  

## 2015-09-25 NOTE — Anesthesia Postprocedure Evaluation (Signed)
Anesthesia Post Note  Patient: Mary [name]  Procedure(s) Performed: Procedure(s) (LRB): ARTHROPLASTY BIPOLAR HIP (HEMIARTHROPLASTY) (Right)  Patient location during evaluation: Nursing Unit Anesthesia Type: Spinal Level of consciousness: awake, oriented and awake and alert Pain management: pain level controlled Vital Signs Assessment: post-procedure vital signs reviewed and stable Respiratory status: spontaneous breathing and patient connected to nasal cannula oxygen Cardiovascular status: blood pressure returned to baseline and stable Postop Assessment: no signs of nausea or vomiting, adequate PO intake and no headache Anesthetic complications: no    Last Vitals:  Filed Vitals:   09/25/15 0338 09/25/15 0448  BP: 181/56   Pulse: 70   Temp: 38.1 C 37.1 C  Resp: 18     Last Pain:  Filed Vitals:   09/25/15 0514  PainSc: 3                  Ilham Roughton,  Romie Levee

## 2015-09-25 NOTE — Progress Notes (Signed)
Foley d/c'd at 0630 with 350cc urine output

## 2015-09-25 NOTE — Progress Notes (Signed)
Physical Therapy Treatment Patient Details Name: Mary [name] MRN: [id number] DOB: 02-03-1935 Today's Date: 09/25/2015    History of Present Illness Pt is an 81 y.o. female with PMH of diabetes, arthritis, HTN and urinary frequency.  Yesterday pt fell on husband's O2 cord and fell on her right hip.  Upon admittance, pt had a right femoral neck fracture and had a right hip posterior unipolar arthroplasty (09-24-15).      PT Comments    Pt was found in chair with nasal cannula off.  Pt's O2 saturation was 85% on room air and 65 bpm heart rate.  After nasal cannula application (2 L/min O2), pt's O2 saturation was 91% and 62 bpm heart rate.  Pt was observed to be incontinent and lethargic.  Upon getting pt to edge of chair, pt was observed to have decreased ability to motor plan and delayed response on left side compared to right.  Pt then attempted to stand and exhibited increased lean to left and decreased strength on the left side.  Pt was then observed to appear to have increased left sided facial droop during smiling and decreased ability to raise left arm compared to right.  Nursing immediately called and immediately came to assess pt (nursing took pt's vitals and BP 128/42); nurse then notified physician (Dr Verdell Carmine) who immediately came to assess pt.  After nursing and MD assessment, pt was cleared to get to bed.  With mod assist x2 (plus 3rd assist for safety), pt was able to stand and ambulate 3 feet to bed with RW (although with significant increased difficulty compared to AM session and pt demonstrating increased difficulty moving L LE compared to R LE; MD Sainani notified of this).  Pt then required max assist x2 for bed mobility sit to supine.  Pt was left in bed with nursing present.   Follow Up Recommendations  SNF     Equipment Recommendations  Rolling walker with 5" wheels;3in1 (PT)    Recommendations for Other Services       Precautions / Restrictions Precautions Precautions:  Fall;Posterior Hip Precaution Booklet Issued: Yes (comment) Restrictions Weight Bearing Restrictions: Yes RLE Weight Bearing: Weight bearing as tolerated    Mobility  Bed Mobility Overal bed mobility: +2 for physical assistance  max assist x2 sit to supine              Transfers Overall transfer level: Needs assistance Equipment used: Rolling walker (2 wheeled) Transfers: Sit to/from Stand Sit to Stand: Mod assist x2            Ambulation/Gait Ambulation/Gait assistance: Mod assist;+2 physical assistance Ambulation Distance (Feet): 3 Feet Assistive device: Rolling walker (2 wheeled) Gait Pattern/deviations: Decreased weight shift to right;Decreased stride length;Decreased step length - left;Decreased stance time - left;Antalgic Gait velocity: decreased   General Gait Details: Pt was profoundly leaning to left side with left LE not progressing to the same degree compared to the right.   Stairs            Wheelchair Mobility    Modified Rankin (Stroke Patients Only)       Balance Overall balance assessment: Needs assistance;History of Falls Sitting-balance support: Feet supported Sitting balance-Leahy Scale: Fair   Postural control: Left lateral lean Standing balance support: Bilateral upper extremity supported (RW x2 assistance ) Standing balance-Leahy Scale: Poor                      Cognition Arousal/Alertness: Lethargic Behavior During Therapy: Flat  affect Overall Cognitive Status: Impaired/Different from baseline Area of Impairment: Orientation;Attention;Memory;Following commands;Safety/judgement;Awareness Orientation Level: Person;Time Current Attention Level: Sustained Memory: Decreased recall of precautions Following Commands: Follows one step commands with increased time Safety/Judgement: Decreased awareness of safety     General Comments: Pt required increased time in motor planning and following tasks for left side compared to  right side.    Exercises    General Comments   Nursing was contacted prior to session and cleared pt for physical therapy.  Pt was lethargic sitting in bedside chair.      Pertinent Vitals/Pain Pain Assessment: 0-10 Pain Score: 6  Pain Descriptors / Indicators: Grimacing;Operative site guarding Pain Intervention(s): Limited activity within patient's tolerance;Premedicated before session;Monitored during session;Repositioned  See flow sheet for vitals.     Home Living Family/patient expects to be discharged to:: Private residence Living Arrangements: Spouse/significant other Available Help at Discharge: Family Type of Home: House Home Access: Stairs to enter Entrance Stairs-Rails: Can reach both Home Layout: One level        Prior Function Level of Independence: Independent          PT Goals (current goals can now be found in the care plan section) Acute Rehab PT Goals Patient Stated Goal: to home home  PT Goal Formulation: With patient Time For Goal Achievement: 10/09/15 Potential to Achieve Goals: Fair Progress towards PT goals: Not progressing toward goals - comment    Frequency  BID    PT Plan Current plan remains appropriate    Co-evaluation             End of Session Equipment Utilized During Treatment: Gait belt;Oxygen (2 L/mini nasal cannula ) Activity Tolerance: Patient limited by lethargy;Patient limited by fatigue Patient left: in bed;with call bell/phone within reach;with nursing/sitter in room     Time: 1425-1504 PT Time Calculation (min) (ACUTE ONLY): 39 min  Charges:                       G Codes:      Mittie Bodo, SPT Mittie Bodo 09/25/2015, 3:37 PM

## 2015-09-25 NOTE — Progress Notes (Signed)
Nursing called to bedside by PT. Pt found in chair had removed O2 cannula, presents with increased confusion, left side facial droop, asymmetrical movement, confusion involving movement/clumsiness. VS obtained, WNL. Notified MD. Stroke assessment completed, negative. Pt is drowsy but oriented x 4. Speech slurred but appropriate. Suspect side effect of medication (Dilaudid). MD aware, will notify if condition deteriorates, continue to monitor. Family at bedside, states pt has "been through a lot" and suspect she is exhausted in addition to medication. Stated she has had similar reaction to narcotics before.

## 2015-09-25 NOTE — Clinical Social Work Placement (Signed)
   CLINICAL SOCIAL WORK PLACEMENT  NOTE  Date:  09/25/2015  Patient Details  Name: Mary [name] MRN: [id number] Date of Birth: 02/27/1935  Clinical Social Work is seeking post-discharge placement for this patient at the [place] level of care (*CSW will initial, date and re-position this form in  chart as items are completed):  Yes   Patient/family provided with [facility] Work Department's list of facilities offering this level of care within the geographic area requested by the patient (or if unable, by the patient's family).  Yes   Patient/family informed of their freedom to choose among providers that offer the needed level of care, that participate in Medicare, Medicaid or managed care program needed by the patient, have an available bed and are willing to accept the patient.  Yes   Patient/family informed of [facility]'s ownership interest in [facility] and [facility], as well as of the fact that they are under no obligation to receive care at these facilities.  PASRR submitted to EDS on 09/25/15     PASRR number received on 09/25/15     Existing PASRR number confirmed on       FL2 transmitted to all facilities in geographic area requested by pt/family on 09/25/15     FL2 transmitted to all facilities within larger geographic area on       Patient informed that his/her managed care company has contracts with or will negotiate with certain facilities, including the following:        Yes   Patient/family informed of bed offers received.  Patient chooses bed at  [facility] )     Physician recommends and patient chooses bed at      Patient to be transferred to   on  .  Patient to be transferred to facility by       Patient family notified on   of transfer.  Name of family member notified:        PHYSICIAN       Additional Comment:    _______________________________________________ Loralyn Freshwater, LCSW 09/25/2015,  1:41 PM

## 2015-09-25 NOTE — Evaluation (Signed)
Physical Therapy Evaluation Patient Details Name: Mary [name] MRN: [id number] DOB: [birth year] Today's Date: 09/25/2015   History of Present Illness  Pt is a 81 y.o. female with PMH of diabetes, arthritis, HTN and urinary frequency.  Yesterday pt tripped over her husbands O2 cord and fell on her right hip.  Upon admittance, pt had a right femoral neck fracture and s/p right hip posterior unipolar arthroplasty (09-24-15).     Clinical Impression  Prior to admission pt was independent (per CM notes, pt's husband recently discharged home from hospital and pt and her husband help take care of their daughter who has CP).  During session, pt's husband sitting in [facility] wheelchair and on portable O2.  Pt at the beginning of the session was sitting on EOB on room air (just returned from bedside commode with nursing assistance).  Pt displayed 83% O2 and 76 bpm heart rate.  Upon communication with nursing, pt was placed back on 2 L/min of O2 via nasal cannula.  Pt was then 93% O2 saturation and 74 bpm heart rate.  Pt was min assist for sit to stand and min assist during ambulation of 15 feet with RW.  Pt displayed a step to gait and complained of 8/10 pain in R hip during ambulation. Due to aforementioned function and strength deficits, pt is in need of skilled physical therapy.  Currently it is recommended that pt is discharged to short term rehab when medically appropriate.     Follow Up Recommendations SNF (Pending progress)    Equipment Recommendations  Rolling walker with 5" wheels;3in1 (PT)    Recommendations for Other Services       Precautions / Restrictions Precautions Precautions: Fall;Posterior Hip Restrictions Weight Bearing Restrictions: Yes RLE Weight Bearing: Weight bearing as tolerated      Mobility  Bed Mobility                  Transfers Overall transfer level: Needs assistance Equipment used: Rolling walker (2 wheeled) Transfers: Sit to/from Stand Sit to Stand: Min  assist  Pt required vc's for hand and feet placement and to maintain posterior THP's.          Ambulation/Gait Ambulation/Gait assistance: Min assist Ambulation Distance (Feet): 15 Feet Assistive device: Rolling walker (2 wheeled) Gait Pattern/deviations: Step-to pattern;Decreased step length - right;Decreased step length - left;Antalgic Gait velocity: decreased  Pt required vc's for turning to R to maintain posterior THP's.    Stairs            Wheelchair Mobility    Modified Rankin (Stroke Patients Only)       Balance Overall balance assessment: Needs assistance Sitting-balance support: Feet supported Sitting balance-Leahy Scale: Good     Standing balance support: Bilateral upper extremity supported(with RW) Standing balance-Leahy Scale: Fair                               Pertinent Vitals/Pain Pain Assessment: 0-10 Pain Score: 8  Pain Descriptors / Indicators: Grimacing;Operative site guarding Pain Intervention(s): Limited activity within patient's tolerance;Monitored during session;Repositioned; nursing notified. See flow sheet for vitals.     Home Living Family/patient expects to be discharged to:: Private residence Living Arrangements: Spouse/significant other Available Help at Discharge: Family Type of Home: House Home Access: Stairs to enter Entrance Stairs-Rails: Can reach both Entrance Stairs-Number of Steps: [place]: One level        Prior Function Level of Independence: Independent  Hand Dominance        Extremity/Trunk Assessment   Upper Extremity Assessment: Generalized weakness           Lower Extremity Assessment: Generalized weakness      Cervical / Trunk Assessment: Normal  Communication   Communication: No difficulties  Cognition Arousal/Alertness: Awake/alert Behavior During Therapy: WFL for tasks assessed/performed Overall Cognitive Status: Within Functional Limits for tasks  assessed                      General Comments   Nursing was contacted and cleared pt for physical therapy.  Pt was agreeable and tolerated session well.     Exercises Total Joint Exercises Ankle Circles/Pumps: AROM;Both;10 reps Gluteal Sets: AROM;Both;10 reps Hip ABduction/ADduction: AROM;Both;10 reps (ab and adduction to neutral) Straight Leg Raises: AROM;Both;10 reps (limited ROM and below 90 degrees hip flexion) Long Arc Quad: AROM;Both;10 reps      Assessment/Plan    PT Assessment Patient needs continued PT services  PT Diagnosis Difficulty walking   PT Problem List Decreased strength;Decreased activity tolerance;Decreased balance;Decreased mobility;Decreased range of motion;Pain  PT Treatment Interventions DME instruction;Gait training;Stair training;Functional mobility training;Therapeutic activities;Therapeutic exercise;Patient/family education;Balance training   PT Goals (Current goals can be found in the Care Plan section) Acute Rehab PT Goals Patient Stated Goal: to go home  PT Goal Formulation: With patient Time For Goal Achievement: 10/09/15 Potential to Achieve Goals: Fair    Frequency BID   Barriers to discharge Decreased caregiver support Pt's husband is in a wheel chair and uses O2    Co-evaluation               End of Session Equipment Utilized During Treatment: Gait belt;Oxygen (2 L/min ) Activity Tolerance: Patient limited by pain Patient left: in chair;with call bell/phone within reach;with chair alarm set;with SCD's reapplied;with family/visitor present; pillows placed between pt's knees for THP's Nurse Communication: Mobility status; pain         Time: 1100-1137 PT Time Calculation (min) (ACUTE ONLY): 37 min   Charges:         PT G Codes:       Mittie Bodo, SPT Mittie Bodo 09/25/2015, 12:15 PM

## 2015-09-25 NOTE — Progress Notes (Signed)
  Subjective: 1 Day Post-Op Procedure(s) (LRB): ARTHROPLASTY BIPOLAR HIP (HEMIARTHROPLASTY) (Right) Patient reports pain as mild.   Patient seen in rounds with Dr. Roland Rack. Patient is well, and has had no acute complaints or problems Plan is to go Rehab after hospital stay. We will wait for the social workers to discuss her family situation. Negative for chest pain and shortness of breath Fever: no Gastrointestinal: Negative for nausea and vomiting  Objective: Vital signs in last 24 hours: Temp:  [98.4 F (36.9 C)-100.5 F (38.1 C)] 98.7 F (37.1 C) (02/02 0448) Pulse Rate:  [66-78] 70 (02/02 0338) Resp:  [15-19] 18 (02/02 0338) BP: (124-207)/(56-109) 181/56 mmHg (02/02 0338) SpO2:  [94 %-100 %] 94 % (02/02 0338) FiO2 (%):  [28 %] 28 % (02/01 2011)  Intake/Output from previous day:  Intake/Output Summary (Last 24 hours) at 09/25/15 0610 Last data filed at 09/25/15 0516  Gross per 24 hour  Intake   2295 ml  Output    615 ml  Net   1680 ml    Intake/Output this shift: Total I/O In: 795 [I.V.:795] Out: 240 [Urine:240]  Labs:  Recent Labs  09/23/15 2204 09/24/15 0534  HGB 12.2 11.7*    Recent Labs  09/23/15 2204 09/24/15 0534  WBC 8.1 10.4  RBC 4.31 4.11  HCT 38.4 36.1  PLT 161 151    Recent Labs  09/23/15 2323 09/24/15 0534  NA 139 137  K 3.5 3.7  CL 102 104  CO2 29 30  BUN 11 10  CREATININE 0.55 0.53  GLUCOSE 150* 169*  CALCIUM 9.0 8.8*   No results for input(s): LABPT, INR in the last 72 hours.   EXAM General - Patient is Alert and Oriented Extremity - Sensation intact distally Dorsiflexion/Plantar flexion intact Compartment soft Dressing/Incision - clean, dry, blood tinged drainage Motor Function - intact, moving foot and toes well on exam. The patient has an abduction pillow that is in place.  Past Medical History  Diagnosis Date  . Diabetes ([place])   . Acid reflux   . Arthritis   . Depression   . Heart murmur   . HTN (hypertension)    . HLD (hyperlipidemia)   . Sleep apnea     Assessment/Plan: 1 Day Post-Op Procedure(s) (LRB): ARTHROPLASTY BIPOLAR HIP (HEMIARTHROPLASTY) (Right) Principal Problem:   Fracture of femoral neck, right (HCC) Active Problems:   Type 2 diabetes mellitus (HCC)   HTN (hypertension)   HLD (hyperlipidemia)   GERD (gastroesophageal reflux disease)   Hypothyroidism  Estimated body mass index is 32.15 kg/(m^2) as calculated from the following:   Height as of this encounter: 5\' 6"  (1.676 m).   Weight as of this encounter: 90.311 kg (199 lb 1.6 oz). Advance diet Up with therapy  DVT Prophylaxis - Lovenox, Foot Pumps and TED hose Weight-Bearing as tolerated to right leg  Reche Dixon, PA-C Orthopaedic Surgery 09/25/2015, 6:10 AM

## 2015-09-25 NOTE — Progress Notes (Signed)
[place] [place] NAME: Mary [name]    MR#:  [id number]  DATE OF BIRTH:  [birth year]  SUBJECTIVE:   Here due to a fall and noted to have a right hip fracture. S/p Right hip arthroplasty POD #1.  Sitting up in chair. Family at bedside.   REVIEW OF SYSTEMS:    Review of Systems  Constitutional: Negative for fever and chills.  HENT: Negative for congestion and tinnitus.   Eyes: Negative for blurred vision and double vision.  Respiratory: Negative for cough, shortness of breath and wheezing.   Cardiovascular: Negative for chest pain, orthopnea and PND.  Gastrointestinal: Negative for nausea, vomiting, abdominal pain and diarrhea.  Genitourinary: Negative for dysuria and hematuria.  Musculoskeletal: Positive for joint pain (Right Hip/Lower Back) and falls.  Neurological: Negative for dizziness, sensory change and focal weakness.  All other systems reviewed and are negative.   Nutrition: Heart Healthy Tolerating Diet: Yes Tolerating PT: Yes   DRUG ALLERGIES:   Allergies  Allergen Reactions  . Cephalexin Hives  . Nitrofurantoin     Other reaction(s): Other (See Comments) Other Reaction: measles-like lesions  . Other     Other reaction(s): Other (See Comments) Uncoded Allergy. Allergen: microdantin, Other Reaction: Measle-like Lesions  . Sulfa Antibiotics Hives  . Atorvastatin Other (See Comments)    Muscle spasms    VITALS:  Blood pressure 115/91, pulse 67, temperature 97.8 F (36.6 C), temperature source Oral, resp. rate 17, height 5\' 6"  (1.676 m), weight 90.311 kg (199 lb 1.6 oz), SpO2 94 %.  PHYSICAL EXAMINATION:   Physical Exam  GENERAL:  81 y.o.-year-old patient sitting up in chair in no acute distress.  EYES: Pupils equal, round, reactive to light and accommodation. No scleral icterus. Extraocular muscles intact.  HEENT: Head atraumatic, normocephalic. Oropharynx and nasopharynx clear.  NECK:  Supple, no  jugular venous distention. No thyroid enlargement, no tenderness.  LUNGS: Normal breath sounds bilaterally, no wheezing, rales, rhonchi. No use of accessory muscles of respiration.  CARDIOVASCULAR: S1, S2 normal. No murmurs, rubs, or gallops.  ABDOMEN: Soft, nontender, nondistended. Bowel sounds present. No organomegaly or mass.  EXTREMITIES: No cyanosis, clubbing or edema b/l.  Right Hip dressing in place from recent surgery.   NEUROLOGIC: Cranial nerves II through XII are intact. No focal Motor or sensory deficits b/l.   PSYCHIATRIC: The patient is alert and oriented x 3. Good affect.  SKIN: No obvious rash, lesion, or ulcer.    LABORATORY PANEL:   CBC  Recent Labs Lab 09/25/15 0502  WBC 9.6  HGB 10.3*  HCT 32.1*  PLT 126*   ------------------------------------------------------------------------------------------------------------------  Chemistries   Recent Labs Lab 09/25/15 0502  NA 138  K 3.7  CL 104  CO2 31  GLUCOSE 127*  BUN 7  CREATININE 0.43*  CALCIUM 8.7*   ------------------------------------------------------------------------------------------------------------------  Cardiac Enzymes No results for input(s): TROPONINI in the last 168 hours. ------------------------------------------------------------------------------------------------------------------  RADIOLOGY:  Dg Chest 1 View  09/23/2015  CLINICAL DATA:  [place] at home tonight. Fell onto right hip. Also hit head. No chest complaints. History of diabetes and hypertension. EXAM: CHEST 1 VIEW COMPARISON:  None. FINDINGS: Cardiac silhouette is normal in size and configuration. No mediastinal or hilar masses or convincing adenopathy. Clear lungs.  No pleural effusion or pneumothorax. Bony thorax is grossly intact. IMPRESSION: No active disease. Electronically Signed   By: Lajean Manes M.D.   On: 09/23/2015 20:55   Dg Hip Port Unilat With Pelvis  1v Right  09/24/2015  CLINICAL DATA:  Postop hip replacement  EXAM: DG HIP (WITH OR WITHOUT PELVIS) 1V PORT RIGHT COMPARISON:  09/23/2015 FINDINGS: Portable AP and cross-table lateral views of the pelvis shows the patient to be status post right hip hemiarthroplasty. No evidence for immediate hardware complications. Surgical skin staples are seen over the lateral soft tissues. IMPRESSION: Status post right hip hemiarthroplasty without immediate hardware complications. Electronically Signed   By: Misty Stanley M.D.   On: 09/24/2015 19:10   Dg Hip Unilat With Pelvis 2-3 Views Right  09/23/2015  CLINICAL DATA:  [place] at home tonight. Patient tripped coming out of the bathroom. Fell onto right hip. Complaining of right hip pain. EXAM: DG HIP (WITH OR WITHOUT PELVIS) 2-3V RIGHT COMPARISON:  None. FINDINGS: There is a fracture of the right femoral neck, mid cervical. Distal fracture component has displaced superiorly in relation to the femoral head component, by approximately 1.8 cm. There is significant varus angulation as well as apex anterior angulation. No significant comminution. No other fractures. The right femoral head is normally aligned with the acetabulum. SI joints, symphysis pubis and left hip joint are normally aligned. IMPRESSION: 1. Mildly displaced, angulated fracture of the right femoral neck. Electronically Signed   By: Lajean Manes M.D.   On: 09/23/2015 20:57     ASSESSMENT AND PLAN:   81 year old female with past medical history of diabetes, GERD, osteoarthritis, depression, hypertension, hyperlipidemia who presented to the hospital after a mechanical fall and noted to have a right hip fracture.  #1 right hip fracture-status post right hip hemiarthroplasty postop day #1. -Continue pain control and care as per orthopedics. -Tolerating physical therapy and likely discharge to rehabilitation within the next 1-2 days.  #2 hypertension-hemodynamically stable. -Continue losartan, Norvasc, Lasix.  #3 hypothyroidism-continue Synthroid.  #4  GERD-continue Protonix.  #5 diabetes type 2 without complication-hold metformin. Continue sliding scale insulin. - BS stable.   #6 Anxiety - cont. Paxil.    All the records are reviewed and case discussed with Care Management/Social Workerr. Management plans discussed with the patient, family and they are in agreement.  CODE STATUS: Full  DVT Prophylaxis: Lovenox.   TOTAL TIME TAKING CARE OF THIS PATIENT: 30 minutes.   POSSIBLE D/C IN 1-2 DAYS, DEPENDING ON CLINICAL CONDITION.   Henreitta Leber M.D on 09/25/2015 at 2:44 PM  Between 7am to 6pm - Pager - [phone]  After 6pm go to [email or web address] - password [email or web address] [facility]  Office  [phone]  CC: Primary care physician; Dion Body, MD

## 2015-09-25 NOTE — Care Management Important Message (Signed)
Important Message  Patient Details  Name: Mary [name] MRN: [id number] Date of Birth: 04-17-1935   Medicare Important Message Given:  Yes    Marshell Garfinkel, RN 09/25/2015, 8:37 AM

## 2015-09-26 ENCOUNTER — Emergency Department: Payer: Medicare Other

## 2015-09-26 ENCOUNTER — Encounter: Payer: Self-pay | Admitting: Surgery

## 2015-09-26 ENCOUNTER — Inpatient Hospital Stay: Payer: Medicare Other

## 2015-09-26 ENCOUNTER — Observation Stay
Admission: EM | Admit: 2015-09-26 | Discharge: 2015-09-27 | Disposition: A | Discharge status: Skilled Nursing Facility | Payer: Medicare Other | Attending: Internal Medicine | Admitting: Internal Medicine

## 2015-09-26 ENCOUNTER — Encounter
Admission: RE | Admit: 2015-09-26 | Discharge: 2015-09-26 | Disposition: A | Discharge status: Home / Self Care | Payer: Medicare Other | Source: Ambulatory Visit | Attending: Internal Medicine | Admitting: Internal Medicine

## 2015-09-26 DIAGNOSIS — F329 Major depressive disorder, single episode, unspecified: Secondary | ICD-10-CM | POA: Insufficient documentation

## 2015-09-26 DIAGNOSIS — R911 Solitary pulmonary nodule: Secondary | ICD-10-CM | POA: Insufficient documentation

## 2015-09-26 DIAGNOSIS — Z79899 Other long term (current) drug therapy: Secondary | ICD-10-CM | POA: Insufficient documentation

## 2015-09-26 DIAGNOSIS — R0902 Hypoxemia: Secondary | ICD-10-CM | POA: Diagnosis not present

## 2015-09-26 DIAGNOSIS — Z96641 Presence of right artificial hip joint: Secondary | ICD-10-CM | POA: Insufficient documentation

## 2015-09-26 DIAGNOSIS — R29810 Facial weakness: Secondary | ICD-10-CM | POA: Insufficient documentation

## 2015-09-26 DIAGNOSIS — Z7984 Long term (current) use of oral hypoglycemic drugs: Secondary | ICD-10-CM | POA: Insufficient documentation

## 2015-09-26 DIAGNOSIS — M1991 Primary osteoarthritis, unspecified site: Secondary | ICD-10-CM | POA: Insufficient documentation

## 2015-09-26 DIAGNOSIS — Z794 Long term (current) use of insulin: Secondary | ICD-10-CM | POA: Insufficient documentation

## 2015-09-26 DIAGNOSIS — I6782 Cerebral ischemia: Secondary | ICD-10-CM | POA: Insufficient documentation

## 2015-09-26 DIAGNOSIS — R4182 Altered mental status, unspecified: Secondary | ICD-10-CM | POA: Insufficient documentation

## 2015-09-26 DIAGNOSIS — J45909 Unspecified asthma, uncomplicated: Secondary | ICD-10-CM | POA: Insufficient documentation

## 2015-09-26 DIAGNOSIS — K219 Gastro-esophageal reflux disease without esophagitis: Secondary | ICD-10-CM | POA: Insufficient documentation

## 2015-09-26 DIAGNOSIS — E039 Hypothyroidism, unspecified: Secondary | ICD-10-CM | POA: Insufficient documentation

## 2015-09-26 DIAGNOSIS — Z9889 Other specified postprocedural states: Secondary | ICD-10-CM | POA: Insufficient documentation

## 2015-09-26 DIAGNOSIS — R350 Frequency of micturition: Secondary | ICD-10-CM | POA: Insufficient documentation

## 2015-09-26 DIAGNOSIS — G4730 Sleep apnea, unspecified: Secondary | ICD-10-CM | POA: Insufficient documentation

## 2015-09-26 DIAGNOSIS — R0600 Dyspnea, unspecified: Secondary | ICD-10-CM

## 2015-09-26 DIAGNOSIS — E119 Type 2 diabetes mellitus without complications: Secondary | ICD-10-CM | POA: Insufficient documentation

## 2015-09-26 DIAGNOSIS — Z7982 Long term (current) use of aspirin: Secondary | ICD-10-CM | POA: Insufficient documentation

## 2015-09-26 DIAGNOSIS — E785 Hyperlipidemia, unspecified: Secondary | ICD-10-CM | POA: Insufficient documentation

## 2015-09-26 DIAGNOSIS — I10 Essential (primary) hypertension: Secondary | ICD-10-CM | POA: Insufficient documentation

## 2015-09-26 DIAGNOSIS — N952 Postmenopausal atrophic vaginitis: Secondary | ICD-10-CM | POA: Insufficient documentation

## 2015-09-26 HISTORY — DX: Unspecified asthma, uncomplicated: J45.909

## 2015-09-26 LAB — BASIC METABOLIC PANEL
ANION GAP: 4 — AB (ref 5–15)
ANION GAP: 8 (ref 5–15)
BUN: 9 mg/dL (ref 6–20)
BUN: 12 mg/dL (ref 6–20)
CALCIUM: 8.5 mg/dL — AB (ref 8.9–10.3)
CHLORIDE: 97 mmol/L — AB (ref 101–111)
CO2: 35 mmol/L — AB (ref 22–32)
CO2: 32 mmol/L (ref 22–32)
CREATININE: 0.44 mg/dL (ref 0.44–1.00)
Calcium: 8.5 mg/dL — ABNORMAL LOW (ref 8.9–10.3)
Chloride: 96 mmol/L — ABNORMAL LOW (ref 101–111)
Creatinine, Ser: 0.52 mg/dL (ref 0.44–1.00)
GFR calc Af Amer: >60 mL/min (ref >60)
GFR calc non Af Amer: >60 mL/min (ref >60)
GLUCOSE: 127 mg/dL — AB (ref 65–99)
Glucose, Bld: 137 mg/dL — ABNORMAL HIGH (ref 65–99)
Potassium: 3.5 mmol/L (ref 3.5–5.1)
Potassium: 3.5 mmol/L (ref 3.5–5.1)
Sodium: 136 mmol/L (ref 135–145)
Sodium: 136 mmol/L (ref 135–145)

## 2015-09-26 LAB — CBC WITH DIFFERENTIAL/PLATELET
BASOS ABS: 0.1 10*3/uL (ref 0–0.1)
BASOS PCT: 1 %
EOS PCT: 4 %
Eosinophils Absolute: 0.4 10*3/uL (ref 0–0.7)
HEMATOCRIT: 33.4 % — AB (ref 35.0–47.0)
Hemoglobin: 10.6 g/dL — ABNORMAL LOW (ref 12.0–16.0)
Lymphocytes Relative: 13 %
Lymphs Abs: 1.1 10*3/uL (ref 1.0–3.6)
MCH: 28.4 pg (ref 26.0–34.0)
MCHC: 31.7 g/dL — ABNORMAL LOW (ref 32.0–36.0)
MCV: 89.5 fL (ref 80.0–100.0)
MONO ABS: 0.9 10*3/uL (ref 0.2–0.9)
Monocytes Relative: 11 %
NEUTROS ABS: 6.1 10*3/uL (ref 1.4–6.5)
Neutrophils Relative %: 71 %
PLATELETS: 142 10*3/uL — AB (ref 150–440)
RBC: 3.73 MIL/uL — AB (ref 3.80–5.20)
RDW: 15.1 % — AB (ref 11.5–14.5)
WBC: 8.4 10*3/uL (ref 3.6–11.0)

## 2015-09-26 LAB — CBC
HEMATOCRIT: 33.1 % — AB (ref 35.0–47.0)
HEMOGLOBIN: 10.7 g/dL — AB (ref 12.0–16.0)
MCH: 28.1 pg (ref 26.0–34.0)
MCHC: 32.5 g/dL (ref 32.0–36.0)
MCV: 86.4 fL (ref 80.0–100.0)
Platelets: 158 10*3/uL (ref 150–440)
RBC: 3.82 MIL/uL (ref 3.80–5.20)
RDW: 15.4 % — ABNORMAL HIGH (ref 11.5–14.5)
WBC: 8.9 10*3/uL (ref 3.6–11.0)

## 2015-09-26 LAB — TROPONIN I: Troponin I: 0.04 ng/mL — ABNORMAL HIGH (ref <0.031)

## 2015-09-26 LAB — GLUCOSE, CAPILLARY
GLUCOSE-CAPILLARY: 128 mg/dL — AB (ref 65–99)
Glucose-Capillary: 128 mg/dL — ABNORMAL HIGH (ref 65–99)
Glucose-Capillary: 130 mg/dL — ABNORMAL HIGH (ref 65–99)

## 2015-09-26 LAB — BRAIN NATRIURETIC PEPTIDE: B NATRIURETIC PEPTIDE 5: 96 pg/mL (ref 0.0–100.0)

## 2015-09-26 MED ORDER — IPRATROPIUM-ALBUTEROL 0.5-2.5 (3) MG/3ML IN SOLN
3.0000 mL | Freq: Once | RESPIRATORY_TRACT | Status: AC
  Administered 2015-09-26: 3 mL via RESPIRATORY_TRACT
  Filled 2015-09-26: qty 3

## 2015-09-26 MED ORDER — ENOXAPARIN SODIUM 40 MG/0.4ML ~~LOC~~ SOLN: 40.0000 mg | SUBCUTANEOUS | Status: DC

## 2015-09-26 MED ORDER — IOHEXOL 350 MG/ML SOLN
100.0000 mL | Freq: Once | INTRAVENOUS | Status: AC | PRN
  Administered 2015-09-26: 100 mL via INTRAVENOUS

## 2015-09-26 MED ORDER — OXYCODONE HCL 5 MG PO TABS: 5.0000 mg | ORAL_TABLET | ORAL | Status: DC | PRN

## 2015-09-26 MED ORDER — FUROSEMIDE 10 MG/ML IJ SOLN
40.0000 mg | Freq: Every day | INTRAMUSCULAR | Status: DC
  Administered 2015-09-27: 40 mg via INTRAVENOUS
  Filled 2015-09-26: qty 4

## 2015-09-26 MED ORDER — LACTULOSE 10 GM/15ML PO SOLN
30.0000 g | Freq: Once | ORAL | Status: AC
Start: 2015-09-26 — End: 2015-09-26
  Administered 2015-09-26: 30 g via ORAL
  Filled 2015-09-26: qty 60

## 2015-09-26 NOTE — ED Notes (Signed)
EMS called out due to low o2 sats. EMS repositioned pt and opened airway nd O2 sats rose to WNL. Pt has no complaints at this time and is sating 97% on 4 L. Pt is on O2 at nursing faculty

## 2015-09-26 NOTE — Care Management (Signed)
I have notified [facility] that patient is going to [facility]. No further RNCM needs. Case closed.

## 2015-09-26 NOTE — Progress Notes (Signed)
Physical Therapy Treatment Patient Details Name: Mary [name] MRN: [id number] DOB: 1935/05/04 Today's Date: 09/26/2015    History of Present Illness Pt is an 81 y.o. female with PMH of diabetes, arthritis, HTN and urinary frequency.  Yesterday pt fell on husband's O2 cord and fell on her right hip.  Upon admittance, pt had a right femoral neck fracture and had a right hip posterior unipolar arthroplasty (09-24-15).      PT Comments    Pt was lethargic during session.  Pt required mod assist for bed mobility and side lying to sit for EOB.  When pt was EOB, pt was also found to be incontinent and nursing was called to assist with changing in standing.  Pt ambulated 6 feet with moderate assistance and RW.  Pt required verbal cues for hand and foot placement throughout the session.   Follow Up Recommendations  SNF     Equipment Recommendations  Rolling walker with 5" wheels;3in1 (PT)    Recommendations for Other Services       Precautions / Restrictions Precautions Precautions: Fall;Posterior Hip Restrictions Weight Bearing Restrictions: Yes RLE Weight Bearing: Weight bearing as tolerated    Mobility  Bed Mobility Overal bed mobility: Needs Assistance Bed Mobility: Rolling;Sidelying to Sit Rolling: Mod assist Sidelying to sit: Mod assist          Transfers Overall transfer level: Needs assistance Equipment used: Rolling walker (2 wheeled)   Sit to Stand: Mod assist x2 trials (from bed and recliner)    Vc's for hand and foot placement required        Ambulation/Gait Ambulation/Gait assistance: Mod assist Ambulation Distance (Feet): 6 Feet Assistive device: Rolling walker (2 wheeled) Gait Pattern/deviations: Step-to pattern;Decreased step length - right;Decreased step length - left;Shuffle;Antalgic Gait velocity: decreased    Vc's for RW use and R LE placement for posterior THP's.   Stairs            Wheelchair Mobility    Modified Rankin (Stroke  Patients Only)       Balance Overall balance assessment: Needs assistance Sitting-balance support: Single extremity supported (bed rail) Sitting balance-Leahy Scale: Fair     Standing balance support: Bilateral upper extremity supported on RW Standing balance-Leahy Scale: Fair                      Cognition Arousal/Alertness: Lethargic Behavior During Therapy: Flat affect Overall Cognitive Status: Within Functional Limits for tasks assessed                      Exercises Total Joint Exercises Ankle Circles/Pumps: AROM;Both;10 reps Heel Slides: AAROM;Right;10 reps Hip ABduction/ADduction: AAROM;Right;10 reps    General Comments   Nursing was contacted and cleared pt for physical therapy.  Pt was agreeable to session although appeared fatigued and lethargic during session.      Pertinent Vitals/Pain Pain Assessment: 0-10 Pain Score: 8  Pain Location: R hip and "all over body" Pain Descriptors / Indicators: Aching;Grimacing;Operative site guarding Pain Intervention(s): Limited activity within patient's tolerance;Monitored during session;Repositioned  See flow sheet for vitals.     Home Living                      Prior Function   Independent with all activities.         PT Goals (current goals can now be found in the care plan section) Acute Rehab PT Goals Patient Stated Goal: to home home  PT  Goal Formulation: With patient Time For Goal Achievement: 10/09/15 Potential to Achieve Goals: Fair Progress towards PT goals: Progressing toward goals    Frequency  BID    PT Plan Current plan remains appropriate    Co-evaluation             End of Session Equipment Utilized During Treatment: Gait belt;Oxygen (2 L/min) Activity Tolerance: Patient limited by lethargy;Patient limited by fatigue Patient left: in chair;with call bell/phone within reach;with chair alarm set;with SCD's reapplied     Time: [id number] PT Time Calculation  (min) (ACUTE ONLY): 38 min  Charges:                       G Codes:      Mittie Bodo, SPT Mittie Bodo 09/26/2015, 10:42 AM

## 2015-09-26 NOTE — Clinical Social Work Placement (Signed)
   CLINICAL SOCIAL WORK PLACEMENT  NOTE  Date:  09/26/2015  Patient Details  Name: Mary [name] MRN: [id number] Date of Birth: 09/21/1934  Clinical Social Work is seeking post-discharge placement for this patient at the [place] level of care (*CSW will initial, date and re-position this form in  chart as items are completed):  Yes   Patient/family provided with [place] Work Department's list of facilities offering this level of care within the geographic area requested by the patient (or if unable, by the patient's family).  Yes   Patient/family informed of their freedom to choose among providers that offer the needed level of care, that participate in Medicare, Medicaid or managed care program needed by the patient, have an available bed and are willing to accept the patient.  Yes   Patient/family informed of [place]'s ownership interest in [facility] and [facility], as well as of the fact that they are under no obligation to receive care at these facilities.  PASRR submitted to EDS on 09/25/15     PASRR number received on 09/25/15     Existing PASRR number confirmed on       FL2 transmitted to all facilities in geographic area requested by pt/family on 09/25/15     FL2 transmitted to all facilities within larger geographic area on       Patient informed that his/her managed care company has contracts with or will negotiate with certain facilities, including the following:        Yes   Patient/family informed of bed offers received.  Patient chooses bed at  [facility] )     Physician recommends and patient chooses bed at      Patient to be transferred to  [facility] ) on 09/26/15.  Patient to be transferred to facility by  [facility] )     Patient family notified on 09/26/15 of transfer.  Name of family member notified:   (Patient's sister and husband were at bedside and aware of D/C today. )      PHYSICIAN       Additional Comment:    _______________________________________________ Loralyn Freshwater, LCSW 09/26/2015, 11:24 AM

## 2015-09-26 NOTE — Progress Notes (Signed)
Subjective: 2 Days Post-Op Procedure(s) (LRB): ARTHROPLASTY BIPOLAR HIP (HEMIARTHROPLASTY) (Right) Patient reports pain as 8 on 0-10 scale.   Patient is well but is rather anxious. Plan is to go Skilled nursing facility after hospital stay. Negative for chest pain and shortness of breath Fever: no Gastrointestinal:Negative for nausea and vomiting  Objective: Vital signs in last 24 hours: Temp:  [98.3 F (36.8 C)-99.2 F (37.3 C)] 98.3 F (36.8 C) (02/03 0736) Pulse Rate:  [57-83] 75 (02/03 0736) Resp:  [16-18] 18 (02/03 0736) BP: (115-174)/(40-91) 157/65 mmHg (02/03 0736) SpO2:  [83 %-97 %] 97 % (02/03 0736)  Intake/Output from previous day:  Intake/Output Summary (Last 24 hours) at 09/26/15 0743 Last data filed at 09/26/15 0456  Gross per 24 hour  Intake    480 ml  Output   1075 ml  Net   -595 ml    Intake/Output this shift:    Labs:  Recent Labs  09/23/15 2204 09/24/15 0534 09/25/15 0502  HGB 12.2 11.7* 10.3*    Recent Labs  09/24/15 0534 09/25/15 0502  WBC 10.4 9.6  RBC 4.11 3.57*  HCT 36.1 32.1*  PLT 151 126*    Recent Labs  09/25/15 0502 09/26/15 0420  NA 138 136  K 3.7 3.5  CL 104 97*  CO2 31 35*  BUN 7 9  CREATININE 0.43* 0.44  GLUCOSE 127* 127*  CALCIUM 8.7* 8.5*   No results for input(s): LABPT, INR in the last 72 hours.   EXAM General - Patient is Alert, Appropriate and Oriented Extremity - ABD soft Neurovascular intact Sensation intact distally Intact pulses distally Dorsiflexion/Plantar flexion intact Incision: scant drainage Dressing/Incision - blood tinged drainage Motor Function - intact, moving foot and toes well on exam.  Past Medical History  Diagnosis Date  . Diabetes ([place])   . Acid reflux   . Arthritis   . Depression   . Heart murmur   . HTN (hypertension)   . HLD (hyperlipidemia)   . Sleep apnea     Assessment/Plan: 2 Days Post-Op Procedure(s) (LRB): ARTHROPLASTY BIPOLAR HIP (HEMIARTHROPLASTY)  (Right) Principal Problem:   Fracture of femoral neck, right (HCC) Active Problems:   Type 2 diabetes mellitus (HCC)   HTN (hypertension)   HLD (hyperlipidemia)   GERD (gastroesophageal reflux disease)   Hypothyroidism  Estimated body mass index is 32.15 kg/(m^2) as calculated from the following:   Height as of this encounter: 5\' 6"  (1.676 m).   Weight as of this encounter: 90.311 kg (199 lb 1.6 oz). Advance diet Up with therapy   Pt needs to have a BM prior to discharge.  Dulcolax and Fleet enema on board for constipation. Continue Lovenox for 14 days following discharge.  One injection daily, 40mg . Staples can be removed in 14 days, follow-up with [facility] in 6 weeks. Dressing changes as needed.  DVT Prophylaxis - Lovenox, Foot Pumps and TED hose Weight-Bearing as tolerated to right leg  J. Cameron Proud, PA-C [facility] 09/26/2015, 7:43 AM

## 2015-09-26 NOTE — Progress Notes (Signed)
PT Cancellation Note  Patient Details Name: Mary [name] MRN: [id number] DOB: 05-Jun-1935   Cancelled Treatment:    Reason Eval/Treat Not Completed: Patient at procedure or test/unavailable.  Pt currently off floor for imaging and not available for treatment session.  Of note, pt also with a discharge order to [facility] today.  Mittie Bodo, SPT Mittie Bodo 09/26/2015, 3:34 PM

## 2015-09-26 NOTE — Progress Notes (Signed)
Patient is medically stable for D/C to [facility] today. Per Kim admissions coordinator at [facility] patient is going to room 207-A. RN will call report at [phone] and arrange EMS pending BM. Clinical Education officer, museum (CSW) sent D/C Summary, FL2 and D/C Packet to [employer] via [employer]. Patient is aware of above. Patient's husband and sister are at bedside and aware of above. Please reconsult if future social work needs arise. CSW signing off.   Blima Rich, LCSW [phone]

## 2015-09-26 NOTE — Progress Notes (Signed)
Checked to see if pre-authorization would be needed for non-emergent EMS transport. Per [employer] benefits obtained online through [employer], patient has a [employer] Medicare Advantage PPO policy.  Medicare PPO plans do not require pre-auth for non-emergent ground transports using service codes A0426 or A0428.   

## 2015-09-26 NOTE — ED Provider Notes (Signed)
[facility] Emergency Department Provider Note  ____________________________________________  Time seen: Approximately 8:44 PM  I have reviewed the triage vital signs and the nursing notes.   HISTORY  Chief Complaint Altered Mental Status    HPI Mary [name] is a 81 y.o. female percents for evaluation of low oxygen saturation.  My initial arrival to room, with oxygen turned off the patient's saturation dropped to approximately 78% with good waveform. Patient placed back on oxygen immediately of 4 L and oxygen level comes back to 94%  Patient states she feels short of breath. She didn't feeling short of breath throughout the last couple days were hospital stay. Denies wheezing. She is not a smoker. No chest pain. No fevers or chills. Denies cough. No leg swelling.   Past Medical History  Diagnosis Date  . Diabetes ([place])   . Acid reflux   . Arthritis   . Depression   . Heart murmur   . HTN (hypertension)   . HLD (hyperlipidemia)   . Sleep apnea   . Asthma     Patient Active Problem List   Diagnosis Date Noted  . Fracture of femoral neck, right ([place]) 09/23/2015  . Type 2 diabetes mellitus ([place]) 09/23/2015  . HTN (hypertension) 09/23/2015  . HLD (hyperlipidemia) 09/23/2015  . GERD (gastroesophageal reflux disease) 09/23/2015  . Depression 09/23/2015  . Hypothyroidism 09/23/2015  . Nocturia 04/29/2015  . Urinary frequency 04/29/2015  . Atrophic vaginitis 04/29/2015    Past Surgical History  Procedure Laterality Date  . Abdominal hysterectomy    . Varicose vein surgery    . Cystoscopy    . Knee arthroscopy w/ autogenous cartilage implantation (aci) procedure    . Hip arthroplasty Right 09/24/2015    Procedure: ARTHROPLASTY BIPOLAR HIP (HEMIARTHROPLASTY);  Surgeon: Corky Mull, MD;  Location: [facility] ORS;  Service: Orthopedics;  Laterality: Right;    Current Outpatient Rx  Name  Route  Sig  Dispense  Refill  . amLODipine (NORVASC) 10 MG  tablet   Oral   Take 10 mg by mouth daily.          Marland Kitchen aspirin 81 MG tablet   Oral   Take 81 mg by mouth daily.          Marland Kitchen conjugated estrogens (PREMARIN) vaginal cream   Vaginal   Place 1 Applicatorful vaginally daily. Patient not taking: Reported on 09/23/2015   30 g   12   . enoxaparin (LOVENOX) 40 MG/0.4ML injection   Subcutaneous   Inject 0.4 mLs (40 mg total) into the skin daily.           Stop date is 10/10/15   . furosemide (LASIX) 20 MG tablet   Oral   Take 20 mg by mouth daily.          . hydrochlorothiazide (HYDRODIURIL) 12.5 MG tablet   Oral   Take 12.5 mg by mouth daily.          Marland Kitchen levothyroxine (SYNTHROID, LEVOTHROID) 75 MCG tablet   Oral   Take 75 mcg by mouth daily.          Marland Kitchen losartan (COZAAR) 100 MG tablet   Oral   Take 100 mg by mouth daily.          Marland Kitchen lovastatin (MEVACOR) 40 MG tablet   Oral   Take 40 mg by mouth at bedtime.          . metFORMIN (GLUCOPHAGE-XR) 500 MG 24 hr tablet  Oral   Take 500 mg by mouth daily.          Marland Kitchen omeprazole (PRILOSEC) 40 MG capsule   Oral   Take 40 mg by mouth daily.          Marland Kitchen oxyCODONE (OXY IR/ROXICODONE) 5 MG immediate release tablet   Oral   Take 1-2 tablets (5-10 mg total) by mouth every 3 (three) hours as needed for breakthrough pain.   30 tablet   0   . EXPIRED: PARoxetine (PAXIL) 40 MG tablet   Oral   Take by mouth daily.          . solifenacin (VESICARE) 10 MG tablet   Oral   Take 1 tablet (10 mg total) by mouth daily.   90 tablet   12   . vitamin B-12 (CYANOCOBALAMIN) 1000 MCG tablet   Oral   Take 1,000 mcg by mouth daily.         . vitamin E 400 UNIT capsule   Oral   Take 400 Units by mouth daily.            Allergies Cephalexin; Nitrofurantoin; Other; Sulfa antibiotics; and Atorvastatin  Family History  Problem Relation Age of Onset  . Kidney disease Brother     also nephew  . Prostate cancer Neg Hx   . Bladder Cancer Neg Hx   . Breast cancer Neg  Hx     Social History Social History  Substance Use Topics  . Smoking status: Never Smoker   . Smokeless tobacco: None  . Alcohol Use: No    Review of Systems Constitutional: No fever/chills Eyes: No visual changes. ENT: No sore throat. Cardiovascular: Denies chest pain. Respiratory: See history of present illness Gastrointestinal: No abdominal pain.  No nausea, no vomiting.  No diarrhea.  No constipation. Genitourinary: Negative for dysuria. Musculoskeletal: Negative for back pain. Skin: Negative for rash. Neurological: Negative for headaches, focal weakness or numbness.  Family did question if she patient has slight drawing up of one side of her face prior to being discharged from the hospital. Patient reports she had a CAT scan of her head prior to leaving the hospital.  10-point ROS otherwise negative.  ____________________________________________   PHYSICAL EXAM:  VITAL SIGNS: ED Triage Vitals  Enc Vitals Group     BP 09/26/15 1959 158/75 mmHg     Pulse Rate 09/26/15 1959 78     Resp 09/26/15 1959 18     Temp 09/26/15 1959 98.3 F (36.8 C)     Temp src --      SpO2 09/26/15 1959 97 %     Weight --      Height --      Head Cir --      Peak Flow --      Pain Score 09/26/15 2000 0     Pain Loc --      Pain Edu? --      Excl. in [place]? --    Constitutional: Alert and oriented. Well appearing and in no acute distress. Eyes: Conjunctivae are normal. PERRL. EOMI. Head: Atraumatic. Nose: No congestion/rhinnorhea. Mouth/Throat: Mucous membranes are moist.  Oropharynx non-erythematous. Neck: No stridor.   Cardiovascular: Normal rate, regular rhythm. Grossly normal heart sounds.  Good peripheral circulation. Respiratory: Normal respiratory effort.  Mild use of accessory muscles. Lungs CTAB. Speaks in short phrases. No wheezing. Gastrointestinal: Soft and nontender. No distention. No abdominal bruits. No CVA tenderness. Musculoskeletal: Right lower extremity scar is  clean dry  intact  Neurologic:  Normal speech and language. No gross focal neurologic deficits are appreciated. Weakness in raising the right lower extremity. No trouble using upper extremities. No facial droop is noted by me. Normal sensation all extremities. Skin:  Skin is warm, dry and intact. No rash noted. Psychiatric: Mood and affect are normal. Speech and behavior are normal.  ____________________________________________   LABS (all labs ordered are listed, but only abnormal results are displayed)  Labs Reviewed  CBC - Abnormal; Notable for the following:    Hemoglobin 10.7 (*)    HCT 33.1 (*)    RDW 15.4 (*)    All other components within normal limits  CBC WITH DIFFERENTIAL/PLATELET - Abnormal; Notable for the following:    RBC 3.73 (*)    Hemoglobin 10.6 (*)    HCT 33.4 (*)    MCHC 31.7 (*)    RDW 15.1 (*)    Platelets 142 (*)    All other components within normal limits  BASIC METABOLIC PANEL - Abnormal; Notable for the following:    Chloride 96 (*)    Glucose, Bld 137 (*)    Calcium 8.5 (*)    All other components within normal limits  TROPONIN I  BRAIN NATRIURETIC PEPTIDE   ____________________________________________  EKG  ED ECG REPORT I, QUALE, MARK, the attending physician, personally viewed and interpreted this ECG.  Date: 09/26/2015 EKG Time: 2021 Rate: 75 Rhythm: normal sinus rhythm QRS Axis: normal Intervals: normal ST/T Wave abnormalities: normal Conduction Disturbances: none Narrative Interpretation: unremarkable  ____________________________________________  RADIOLOGY   CT Angio Chest PE W/Cm &/Or Wo Cm (Final result) Result time: 09/26/15 22:17:28   Final result by Rad Results In Interface (09/26/15 22:17:28)   Narrative:   CLINICAL DATA: Dyspnea. Hypoxemia 2 days post hip arthroplasty.  EXAM: CT ANGIOGRAPHY CHEST WITH CONTRAST  TECHNIQUE: Multidetector CT imaging of the chest was performed using the standard protocol during  bolus administration of intravenous contrast. Multiplanar CT image reconstructions and MIPs were obtained to evaluate the vascular anatomy.  CONTRAST: 138mL OMNIPAQUE IOHEXOL 350 MG/ML SOLN  COMPARISON: Radiographs earlier this day.  FINDINGS: There are no filling defects within the pulmonary arteries to suggest pulmonary embolus.  Normal caliber thoracic aorta with mild atherosclerosis. Small irregular plaque involving the transverse aorta. No dissection or acute aortic abnormality. Conventional branching pattern from the aortic arch. No mediastinal or hilar adenopathy. No pericardial effusion.  No pulmonary edema. Subsegmental atelectasis in the lower lobes, lingula and right middle lobe. No consolidation. Right middle lobe 7 mm nodule is unchanged compared to chest CT 03/22/2013 and considered benign. No new pulmonary nodule.  No acute abnormality in the included upper abdomen. There are no acute or suspicious osseous abnormalities.  Review of the MIP images confirms the above findings.  IMPRESSION: 1. No pulmonary embolus. 2. Minimal scattered subsegmental atelectasis. 3. Benign right middle lobe pulmonary nodule, stable from 2014.   Electronically Signed By: Jeb Levering M.D. On: 09/26/2015 22:17          DG Chest 2 View (Final result) Result time: 09/26/15 21:28:16   Final result by Rad Results In Interface (09/26/15 21:28:16)   Narrative:   CLINICAL DATA: Oxygen requirement post recent hip surgery 2 days prior.  EXAM: CHEST 2 VIEW  COMPARISON: Preoperative exam 09/23/2015  FINDINGS: The cardiomediastinal contours are unchanged. Mild subsegmental atelectasis in the left mid and lower lung zone. Pulmonary vasculature is normal. No consolidation, pleural effusion, or pneumothorax. No acute osseous abnormalities are seen.  IMPRESSION: Mild subsegmental atelectasis in the left lung base. Exam is otherwise unchanged.   Electronically  Signed By: Jeb Levering M.D. On: 09/26/2015 21:28       ____________________________________________   PROCEDURES  Procedure(s) performed: None  Critical Care performed: Yes, see critical care note(s)  CRITICAL CARE Performed by: Delman Kitten   Total critical care time: 35 minutes  Critical care time was exclusive of separately billable procedures and treating other patients.  Critical care was necessary to treat or prevent imminent or life-threatening deterioration.  Critical care was time spent personally by me on the following activities: development of treatment plan with patient and/or surrogate as well as nursing, discussions with consultants, evaluation of patient's response to treatment, examination of patient, obtaining history from patient or surrogate, ordering and performing treatments and interventions, ordering and review of laboratory studies, ordering and review of radiographic studies, pulse oximetry and re-evaluation of patient's condition.  Patient resents with dyspnea and with out oxygen her saturation is less than 80% on room air. Required evaluation for hypoxia. Of note discharge summary indicates patient was to be discharged on room air and not on oxygen. ____________________________________________   INITIAL IMPRESSION / ASSESSMENT AND PLAN / ED COURSE  Pertinent labs & imaging results that were available during my care of the patient were reviewed by me and considered in my medical decision making (see chart for details).  Hypoxia. Review hospital notes indicate this was noted prior to discharge by nursing staff who had to reapply nasal cannula oxygen. Patient is hypoxic, clearly requiring oxygen. Her work of breathing is slightly increased and she doesn't have any pleuritic pain. EKG reassuring as well as labs. I was concerned about the possibility of pulmonary embolism, pneumonia, etc. but CT scan does not indicate. Patient clearly has a good  waveform indicating hypoxia when off oxygen. Based on her recent admission to the hospital, discharged today and ongoing oxygen requirement we will readmit the patient for further workup. ____________________________________________   FINAL CLINICAL IMPRESSION(S) / ED DIAGNOSES  Final diagnoses:  Dyspnea  Hypoxia      Delman Kitten, MD 09/26/15 2239

## 2015-09-26 NOTE — Progress Notes (Signed)
Spoke to Dr. Gladstone Lighter who ordered lactulose 30g for pt's constipation.

## 2015-09-26 NOTE — Discharge Summary (Signed)
[facility] NAME: Mary [name]    MR#:  [id number]  DATE OF BIRTH:  April 03, 1935  DATE OF ADMISSION:  09/23/2015 ADMITTING PHYSICIAN: Lance Coon, MD  DATE OF DISCHARGE: 09/26/2015  PRIMARY CARE PHYSICIAN: Dion Body, MD    ADMISSION DIAGNOSIS:  Fall, initial encounter [W19.XXXA] Hip fracture, right, closed, initial encounter ([place]) [S72.001A]  DISCHARGE DIAGNOSIS:  Principal Problem:   Fracture of femoral neck, right ([place]) Active Problems:   Type 2 diabetes mellitus (HCC)   HTN (hypertension)   HLD (hyperlipidemia)   GERD (gastroesophageal reflux disease)   Hypothyroidism   SECONDARY DIAGNOSIS:   Past Medical History  Diagnosis Date  . Diabetes ([place])   . Acid reflux   . Arthritis   . Depression   . Heart murmur   . HTN (hypertension)   . HLD (hyperlipidemia)   . Sleep apnea     HOSPITAL COURSE:   81 year old female with past medical history of diabetes, GERD, osteoarthritis, depression, hypertension, hyperlipidemia who presented to the hospital after a mechanical fall and noted to have a right hip fracture.  #1 right hip fracture-patient was admitted to the hospital and underwent a right hip hemiarthroplasty and is postop day #2 today. -Clinically she is feeling better today. Her pain is well controlled on oral medications. She has worked with physical therapy and is tolerating it well. -She is being discharged to a skilled nursing facility for ongoing further rehabilitation and follow-up with orthopedics as an outpatient. -She will continue Lovenox for 2 weeks for DVT prophylaxis.  #2 hypertension-she remained hemodynamically stable while in the hospital and will continue losartan Norvasc and Lasix upon discharge.  #3 hypothyroidism- she will continue Synthroid.  #4 GERD-she will cont. Her Prilosec  #5 diabetes type 2 without complication-on the hospital patient was maintained on sliding scale  insulin but will resume her metformin upon discharge. Her blood sugars remained stable.  #6 Anxiety - she will cont. Paxil.   #7 Hyperlipidemia - she will resume her Lovastatin.   DISCHARGE CONDITIONS:   Stable  CONSULTS OBTAINED:  Treatment Team:  Corky Mull, MD Earnestine Leys, MD  DRUG ALLERGIES:   Allergies  Allergen Reactions  . Cephalexin Hives  . Nitrofurantoin     Other reaction(s): Other (See Comments) Other Reaction: measles-like lesions  . Other     Other reaction(s): Other (See Comments) Uncoded Allergy. Allergen: microdantin, Other Reaction: Measle-like Lesions  . Sulfa Antibiotics Hives  . Atorvastatin Other (See Comments)    Muscle spasms    DISCHARGE MEDICATIONS:   Current Discharge Medication List    START taking these medications   Details  enoxaparin (LOVENOX) 40 MG/0.4ML injection Inject 0.4 mLs (40 mg total) into the skin daily.    oxyCODONE (OXY IR/ROXICODONE) 5 MG immediate release tablet Take 1-2 tablets (5-10 mg total) by mouth every 3 (three) hours as needed for breakthrough pain. Qty: 30 tablet, Refills: 0      CONTINUE these medications which have NOT CHANGED   Details  amLODipine (NORVASC) 10 MG tablet Take 10 mg by mouth daily.     aspirin 81 MG tablet Take 81 mg by mouth daily.     furosemide (LASIX) 20 MG tablet Take 20 mg by mouth daily.     hydrochlorothiazide (HYDRODIURIL) 12.5 MG tablet Take 12.5 mg by mouth daily.     levothyroxine (SYNTHROID, LEVOTHROID) 75 MCG tablet Take 75 mcg by mouth daily.     losartan (  COZAAR) 100 MG tablet Take 100 mg by mouth daily.     lovastatin (MEVACOR) 40 MG tablet Take 40 mg by mouth at bedtime.     metFORMIN (GLUCOPHAGE-XR) 500 MG 24 hr tablet Take 500 mg by mouth daily.     omeprazole (PRILOSEC) 40 MG capsule Take 40 mg by mouth daily.     PARoxetine (PAXIL) 40 MG tablet Take by mouth daily.     solifenacin (VESICARE) 10 MG tablet Take 1 tablet (10 mg total) by mouth daily. Qty:  90 tablet, Refills: 12   Associated Diagnoses: Urinary frequency    vitamin B-12 (CYANOCOBALAMIN) 1000 MCG tablet Take 1,000 mcg by mouth daily.    vitamin E 400 UNIT capsule Take 400 Units by mouth daily.     conjugated estrogens (PREMARIN) vaginal cream Place 1 Applicatorful vaginally daily. Qty: 30 g, Refills: 12   Associated Diagnoses: Atrophic vaginitis         DISCHARGE INSTRUCTIONS:   DIET:  Cardiac diet and Diabetic diet  DISCHARGE CONDITION:  Stable  ACTIVITY:  Activity as tolerated  OXYGEN:  Home Oxygen: No.   Oxygen Delivery: room air  DISCHARGE LOCATION:  nursing home   If you experience worsening of your admission symptoms, develop shortness of breath, life threatening emergency, suicidal or homicidal thoughts you must seek medical attention immediately by calling 911 or calling your MD immediately  if symptoms less severe.  You Must read complete instructions/literature along with all the possible adverse reactions/side effects for all the Medicines you take and that have been prescribed to you. Take any new Medicines after you have completely understood and accpet all the possible adverse reactions/side effects.   Please note  You were cared for by a hospitalist during your hospital stay. If you have any questions about your discharge medications or the care you received while you were in the hospital after you are discharged, you can call the unit and asked to speak with the hospitalist on call if the hospitalist that took care of you is not available. Once you are discharged, your primary care physician will handle any further medical issues. Please note that NO REFILLS for any discharge medications will be authorized once you are discharged, as it is imperative that you return to your primary care physician (or establish a relationship with a primary care physician if you do not have one) for your aftercare needs so that they can reassess your need for  medications and monitor your lab values.     Today   No acute events overnight.  Constipated and will give suppository today.    VITAL SIGNS:  Blood pressure 157/65, pulse 78, temperature 98.3 F (36.8 C), temperature source Oral, resp. rate 18, height 5\' 6"  (1.676 m), weight 90.311 kg (199 lb 1.6 oz), SpO2 96 %.  I/O:   Intake/Output Summary (Last 24 hours) at 09/26/15 1055 Last data filed at 09/26/15 1031  Gross per 24 hour  Intake    480 ml  Output   1125 ml  Net   -645 ml    PHYSICAL EXAMINATION:  GENERAL:  81 y.o.-year-old patient lying in the bed with no acute distress.  EYES: Pupils equal, round, reactive to light and accommodation. No scleral icterus. Extraocular muscles intact.  HEENT: Head atraumatic, normocephalic. Oropharynx and nasopharynx clear.  NECK:  Supple, no jugular venous distention. No thyroid enlargement, no tenderness.  LUNGS: Normal breath sounds bilaterally, no wheezing, rales,rhonchi. No use of accessory muscles of respiration.  CARDIOVASCULAR:  S1, S2 normal. II/VI SEM at LSB, rubs, or gallops.  ABDOMEN: Soft, non-tender, non-distended. Bowel sounds present. No organomegaly or mass.  EXTREMITIES: No pedal edema, cyanosis, or clubbing.  NEUROLOGIC: Cranial nerves II through XII are intact. No focal motor or sensory defecits b/l.  PSYCHIATRIC: The patient is alert and oriented x 3. Good affect.  SKIN: No obvious rash, lesion, or ulcer.   DATA REVIEW:   CBC  Recent Labs Lab 09/25/15 0502  WBC 9.6  HGB 10.3*  HCT 32.1*  PLT 126*    Chemistries   Recent Labs Lab 09/26/15 0420  NA 136  K 3.5  CL 97*  CO2 35*  GLUCOSE 127*  BUN 9  CREATININE 0.44  CALCIUM 8.5*    Cardiac Enzymes No results for input(s): TROPONINI in the last 168 hours.  Microbiology Results  Results for orders placed or performed during the hospital encounter of 09/23/15  Surgical pcr screen     Status: Abnormal   Collection Time: 09/24/15  1:55 PM  Result  Value Ref Range Status   MRSA, PCR NEGATIVE NEGATIVE Final   Staphylococcus aureus POSITIVE (A) NEGATIVE Final    Comment:        The Xpert SA Assay (FDA approved for NASAL specimens in patients over 85 years of age), is one component of a comprehensive surveillance program.  Test performance has been validated by [facility] for patients greater than or equal to 58 year old. It is not intended to diagnose infection nor to guide or monitor treatment.     RADIOLOGY:  Dg Hip Port Unilat With Pelvis 1v Right  09/24/2015  CLINICAL DATA:  Postop hip replacement EXAM: DG HIP (WITH OR WITHOUT PELVIS) 1V PORT RIGHT COMPARISON:  09/23/2015 FINDINGS: Portable AP and cross-table lateral views of the pelvis shows the patient to be status post right hip hemiarthroplasty. No evidence for immediate hardware complications. Surgical skin staples are seen over the lateral soft tissues. IMPRESSION: Status post right hip hemiarthroplasty without immediate hardware complications. Electronically Signed   By: Misty Stanley M.D.   On: 09/24/2015 19:10      Management plans discussed with the patient, family and they are in agreement.  CODE STATUS:     Code Status Orders        Start     Ordered   09/24/15 0341  Full code   Continuous     09/24/15 0340    Code Status History    Date Active Date Inactive Code Status Order ID Comments User Context   This patient has a current code status but no historical code status.      TOTAL TIME TAKING CARE OF THIS PATIENT: 40 minutes.    Henreitta Leber M.D on 09/26/2015 at 10:55 AM  Between 7am to 6pm - Pager - [phone]  After 6pm go to [email or web address] - password [email or web address] [facility]  Office  [phone]  CC: Primary care physician; Dion Body, MD

## 2015-09-26 NOTE — Progress Notes (Addendum)
Called report to Melody at [facility]. IV's removed. Belongings packed, EMS called. Honeycomb dressing changed. VSS at time of discharge.

## 2015-09-27 DIAGNOSIS — E119 Type 2 diabetes mellitus without complications: Secondary | ICD-10-CM | POA: Diagnosis not present

## 2015-09-27 DIAGNOSIS — Z794 Long term (current) use of insulin: Secondary | ICD-10-CM | POA: Diagnosis present

## 2015-09-27 LAB — GLUCOSE, CAPILLARY
GLUCOSE-CAPILLARY: 119 mg/dL — AB (ref 65–99)
GLUCOSE-CAPILLARY: 167 mg/dL — AB (ref 65–99)

## 2015-09-27 LAB — CBC
HEMATOCRIT: 30.9 % — AB (ref 35.0–47.0)
HEMOGLOBIN: 10 g/dL — AB (ref 12.0–16.0)
MCH: 28.5 pg (ref 26.0–34.0)
MCHC: 32.5 g/dL (ref 32.0–36.0)
MCV: 87.8 fL (ref 80.0–100.0)
Platelets: 141 10*3/uL — ABNORMAL LOW (ref 150–440)
RBC: 3.52 MIL/uL — ABNORMAL LOW (ref 3.80–5.20)
RDW: 14.8 % — AB (ref 11.5–14.5)
WBC: 7.8 10*3/uL (ref 3.6–11.0)

## 2015-09-27 LAB — BASIC METABOLIC PANEL
ANION GAP: 6 (ref 5–15)
BUN: 12 mg/dL (ref 6–20)
CALCIUM: 8.4 mg/dL — AB (ref 8.9–10.3)
CO2: 35 mmol/L — ABNORMAL HIGH (ref 22–32)
Chloride: 96 mmol/L — ABNORMAL LOW (ref 101–111)
Creatinine, Ser: 0.52 mg/dL (ref 0.44–1.00)
GFR calc Af Amer: >60 mL/min (ref >60)
GLUCOSE: 133 mg/dL — AB (ref 65–99)
POTASSIUM: 3.3 mmol/L — AB (ref 3.5–5.1)
SODIUM: 137 mmol/L (ref 135–145)

## 2015-09-27 LAB — TROPONIN I
TROPONIN I: 0.03 ng/mL (ref <0.031)
Troponin I: <0.03 ng/mL (ref <0.031)

## 2015-09-27 MED ORDER — LEVOTHYROXINE SODIUM 75 MCG PO TABS
75.0000 ug | ORAL_TABLET | Freq: Every day | ORAL | Status: DC
  Administered 2015-09-27: 75 ug via ORAL
  Filled 2015-09-27: qty 1

## 2015-09-27 MED ORDER — SODIUM CHLORIDE 0.9% FLUSH
3.0000 mL | Freq: Two times a day (BID) | INTRAVENOUS | Status: DC
  Administered 2015-09-27 (×2): 3 mL via INTRAVENOUS

## 2015-09-27 MED ORDER — OXYCODONE HCL 5 MG PO TABS
5.0000 mg | ORAL_TABLET | ORAL | Status: DC | PRN
  Administered 2015-09-27: 5 mg via ORAL
  Filled 2015-09-27: qty 1

## 2015-09-27 MED ORDER — SODIUM CHLORIDE 0.9 % IV SOLN: 250.0000 mL | INTRAVENOUS | Status: DC | PRN

## 2015-09-27 MED ORDER — ONDANSETRON HCL 4 MG PO TABS: 4.0000 mg | ORAL_TABLET | Freq: Four times a day (QID) | ORAL | Status: DC | PRN

## 2015-09-27 MED ORDER — VITAMIN E 45 MG (100 UNIT) PO CAPS
400.0000 [IU] | ORAL_CAPSULE | Freq: Every day | ORAL | Status: DC
  Administered 2015-09-27: 400 [IU] via ORAL
  Filled 2015-09-27: qty 4

## 2015-09-27 MED ORDER — VITAMIN B-12 1000 MCG PO TABS
1000.0000 ug | ORAL_TABLET | Freq: Every day | ORAL | Status: DC
  Administered 2015-09-27: 1000 ug via ORAL
  Filled 2015-09-27: qty 1

## 2015-09-27 MED ORDER — ACETAMINOPHEN 650 MG RE SUPP: 650.0000 mg | Freq: Four times a day (QID) | RECTAL | Status: DC | PRN

## 2015-09-27 MED ORDER — METFORMIN HCL ER 500 MG PO TB24
500.0000 mg | ORAL_TABLET | Freq: Every day | ORAL | Status: DC
  Administered 2015-09-27: 500 mg via ORAL
  Filled 2015-09-27: qty 1

## 2015-09-27 MED ORDER — PRAVASTATIN SODIUM 20 MG PO TABS: 40.0000 mg | ORAL_TABLET | Freq: Every day | ORAL | Status: DC

## 2015-09-27 MED ORDER — BISACODYL 5 MG PO TBEC: 5.0000 mg | DELAYED_RELEASE_TABLET | Freq: Every day | ORAL | Status: DC | PRN

## 2015-09-27 MED ORDER — ENOXAPARIN SODIUM 40 MG/0.4ML ~~LOC~~ SOLN
40.0000 mg | Freq: Every day | SUBCUTANEOUS | Status: DC
  Administered 2015-09-27: 40 mg via SUBCUTANEOUS
  Filled 2015-09-27: qty 0.4

## 2015-09-27 MED ORDER — ONDANSETRON HCL 4 MG/2ML IJ SOLN: 4.0000 mg | Freq: Four times a day (QID) | INTRAMUSCULAR | Status: DC | PRN

## 2015-09-27 MED ORDER — LOSARTAN POTASSIUM 50 MG PO TABS
100.0000 mg | ORAL_TABLET | Freq: Every day | ORAL | Status: DC
  Administered 2015-09-27: 100 mg via ORAL
  Filled 2015-09-27 (×2): qty 2

## 2015-09-27 MED ORDER — ASPIRIN 81 MG PO CHEW
81.0000 mg | CHEWABLE_TABLET | Freq: Every day | ORAL | Status: DC
  Administered 2015-09-27: 81 mg via ORAL
  Filled 2015-09-27: qty 1

## 2015-09-27 MED ORDER — SODIUM CHLORIDE 0.9% FLUSH: 3.0000 mL | INTRAVENOUS | Status: DC | PRN

## 2015-09-27 MED ORDER — DARIFENACIN HYDROBROMIDE ER 7.5 MG PO TB24
7.5000 mg | ORAL_TABLET | Freq: Every day | ORAL | Status: DC
  Administered 2015-09-27: 7.5 mg via ORAL
  Filled 2015-09-27: qty 1

## 2015-09-27 MED ORDER — POTASSIUM CHLORIDE 20 MEQ PO PACK
40.0000 meq | PACK | Freq: Once | ORAL | Status: AC
  Administered 2015-09-27: 40 meq via ORAL
  Filled 2015-09-27: qty 2

## 2015-09-27 MED ORDER — PANTOPRAZOLE SODIUM 40 MG PO TBEC
40.0000 mg | DELAYED_RELEASE_TABLET | Freq: Every day | ORAL | Status: DC
  Administered 2015-09-27: 40 mg via ORAL
  Filled 2015-09-27: qty 1

## 2015-09-27 MED ORDER — AMLODIPINE BESYLATE 10 MG PO TABS
10.0000 mg | ORAL_TABLET | Freq: Every day | ORAL | Status: DC
  Administered 2015-09-27: 10 mg via ORAL
  Filled 2015-09-27: qty 1

## 2015-09-27 MED ORDER — PAROXETINE HCL 20 MG PO TABS
40.0000 mg | ORAL_TABLET | Freq: Every day | ORAL | Status: DC
  Administered 2015-09-27: 40 mg via ORAL
  Filled 2015-09-27: qty 2

## 2015-09-27 MED ORDER — ACETAMINOPHEN 325 MG PO TABS: 650.0000 mg | ORAL_TABLET | Freq: Four times a day (QID) | ORAL | Status: DC | PRN

## 2015-09-27 NOTE — Progress Notes (Signed)
Report called to [facility]. Patients status gone over with nurse. Patient a@o . Will be discharged on 2l. Ems to transport patient. No distress noted. No c/o pain. Will remove iv and tele when ems arrives

## 2015-09-27 NOTE — H&P (Signed)
[facility] [place] NAME: Mary [name]    MR#:  [id number]  DATE OF BIRTH:  [birth year]  DATE OF ADMISSION:  09/26/2015  PRIMARY CARE PHYSICIAN: Dion Body, MD   REQUESTING/REFERRING PHYSICIAN:   CHIEF COMPLAINT:   Chief Complaint  Patient presents with  . Altered Mental Status    HISTORY OF PRESENT ILLNESS: Mary [name]  is a 81 y.o. female with a known history of diabetes mellitus type 2, GERD, arthritis, hypertension, hyperlipidemia, status post right hip hemiarthroplasty presented to the emergency room from [facility] rehabilitation facility. Patient was discharged to rehabilitation facility on Friday evening that is 09/26/2015. She was discharged to rehabilitation facility and oxygen via nasal cannula. After reaching the rehabilitation facility she felt short of breath and her oxygen saturation dropped to less than 80%. He was transferred back to the emergency room and off oxygen her O2 saturations were around 73%. She was put on oxygen at 4 L by nasal cannula and oxygen saturation levels came up to 94%. No complaints of any chest pain. No orthopnea. No fever, chills or cough. Patient was worked up with a CT angiogram of the chest in the emergency room which showed no pulmonary embolism. First set of troponin was 0.04. Hospitalist service was consulted for further care.  PAST MEDICAL HISTORY:   Past Medical History  Diagnosis Date  . Diabetes ([place])   . Acid reflux   . Arthritis   . Depression   . Heart murmur   . HTN (hypertension)   . HLD (hyperlipidemia)   . Sleep apnea   . Asthma     PAST SURGICAL HISTORY: Past Surgical History  Procedure Laterality Date  . Abdominal hysterectomy    . Varicose vein surgery    . Cystoscopy    . Knee arthroscopy w/ autogenous cartilage implantation (aci) procedure    . Hip arthroplasty Right 09/24/2015    Procedure: ARTHROPLASTY BIPOLAR HIP (HEMIARTHROPLASTY);  Surgeon: Corky Mull, MD;   Location: [facility] ORS;  Service: Orthopedics;  Laterality: Right;    SOCIAL HISTORY:  Social History  Substance Use Topics  . Smoking status: Never Smoker   . Smokeless tobacco: Not on file  . Alcohol Use: No    FAMILY HISTORY:  Family History  Problem Relation Age of Onset  . Kidney disease Brother     also nephew  . Prostate cancer Neg Hx   . Bladder Cancer Neg Hx   . Breast cancer Neg Hx     DRUG ALLERGIES:  Allergies  Allergen Reactions  . Cephalexin Hives  . Nitrofurantoin     Other reaction(s): Other (See Comments) Other Reaction: measles-like lesions  . Other     Other reaction(s): Other (See Comments) Uncoded Allergy. Allergen: microdantin, Other Reaction: Measle-like Lesions  . Sulfa Antibiotics Hives  . Atorvastatin Other (See Comments)    Muscle spasms    REVIEW OF SYSTEMS:   CONSTITUTIONAL: No fever, has weakness.  EYES: No blurred or double vision.  EARS, NOSE, AND THROAT: No tinnitus or ear pain.  RESPIRATORY: No cough, shortness of breath present, no wheezing or hemoptysis.  CARDIOVASCULAR: No chest pain, orthopnea, edema.  GASTROINTESTINAL: No nausea, vomiting, diarrhea or abdominal pain.  GENITOURINARY: No dysuria, hematuria.  ENDOCRINE: No polyuria, nocturia,  HEMATOLOGY: No anemia, easy bruising or bleeding SKIN: No rash or lesion. MUSCULOSKELETAL: Has right hip joint pain. NEUROLOGIC: No tingling, numbness, weakness.  PSYCHIATRY: No anxiety or depression.   MEDICATIONS AT  HOME:  Prior to Admission medications   Medication Sig Start Date End Date Taking? Authorizing Provider  amLODipine (NORVASC) 10 MG tablet Take 10 mg by mouth daily.    Yes Historical Provider, MD  aspirin 81 MG tablet Take 81 mg by mouth daily.    Yes Historical Provider, MD  enoxaparin (LOVENOX) 40 MG/0.4ML injection Inject 0.4 mLs (40 mg total) into the skin daily. 09/26/15 10/10/15 Yes Henreitta Leber, MD  furosemide (LASIX) 20 MG tablet Take 20 mg by mouth daily.    Yes  Historical Provider, MD  hydrochlorothiazide (HYDRODIURIL) 12.5 MG tablet Take 12.5 mg by mouth daily.    Yes Historical Provider, MD  levothyroxine (SYNTHROID, LEVOTHROID) 75 MCG tablet Take 75 mcg by mouth daily.    Yes Historical Provider, MD  losartan (COZAAR) 100 MG tablet Take 100 mg by mouth daily.    Yes Historical Provider, MD  lovastatin (MEVACOR) 40 MG tablet Take 40 mg by mouth at bedtime.    Yes Historical Provider, MD  metFORMIN (GLUCOPHAGE-XR) 500 MG 24 hr tablet Take 500 mg by mouth daily.    Yes Historical Provider, MD  omeprazole (PRILOSEC) 40 MG capsule Take 40 mg by mouth daily.    Yes Historical Provider, MD  oxyCODONE (OXY IR/ROXICODONE) 5 MG immediate release tablet Take 1-2 tablets (5-10 mg total) by mouth every 3 (three) hours as needed for breakthrough pain. 09/26/15  Yes Henreitta Leber, MD  solifenacin (VESICARE) 10 MG tablet Take 1 tablet (10 mg total) by mouth daily. 05/20/15  Yes Shannon A McGowan, PA-C  vitamin B-12 (CYANOCOBALAMIN) 1000 MCG tablet Take 1,000 mcg by mouth daily.   Yes Historical Provider, MD  vitamin E 400 UNIT capsule Take 400 Units by mouth daily.    Yes Historical Provider, MD  conjugated estrogens (PREMARIN) vaginal cream Place 1 Applicatorful vaginally daily. Patient not taking: Reported on 09/23/2015 05/20/15   Larene Beach A McGowan, PA-C  PARoxetine (PAXIL) 40 MG tablet Take by mouth daily.  08/13/14 09/24/15  Historical Provider, MD      PHYSICAL EXAMINATION:   VITAL SIGNS: Blood pressure 154/97, pulse 79, temperature 98.2 F (36.8 C), temperature source Oral, resp. rate 20, SpO2 97 %.  GENERAL:  81 y.o.-year-old patient lying in the bed with no acute distress.  EYES: Pupils equal, round, reactive to light and accommodation. No scleral icterus. Extraocular muscles intact.  HEENT: Head atraumatic, normocephalic. Oropharynx and nasopharynx clear.  NECK:  Supple, no jugular venous distention. No thyroid enlargement, no tenderness.  LUNGS: Normal  breath sounds bilaterally, no wheezing, basal crepitations heard, rhonchi or crepitation. No use of accessory muscles of respiration.  CARDIOVASCULAR: S1, S2 normal. No murmurs, rubs, or gallops.  ABDOMEN: Soft, nontender, nondistended. Bowel sounds present. No organomegaly or mass.  EXTREMITIES: No pedal edema, cyanosis, or clubbing.  NEUROLOGIC: Cranial nerves II through XII are intact. Muscle strength 5/5 in all extremities. Sensation intact. Gait could not be assessed. PSYCHIATRIC: The patient is alert and oriented x 3.  SKIN: No obvious rash, lesion, or ulcer.   LABORATORY PANEL:   CBC  Recent Labs Lab 09/23/15 2204 09/24/15 0534 09/25/15 0502 09/26/15 1952 09/26/15 2138  WBC 8.1 10.4 9.6 8.9 8.4  HGB 12.2 11.7* 10.3* 10.7* 10.6*  HCT 38.4 36.1 32.1* 33.1* 33.4*  PLT 161 151 126* 158 142*  MCV 89.1 87.7 89.9 86.4 89.5  MCH 28.2 28.5 28.9 28.1 28.4  MCHC 31.7* 32.5 32.1 32.5 31.7*  RDW 15.6* 14.8* 15.2* 15.4* 15.1*  LYMPHSABS 1.2  --  0.9*  --  1.1  MONOABS 0.4  --  0.7  --  0.9  EOSABS 0.1  --  0.2  --  0.4  BASOSABS 0.0  --  0.0  --  0.1   ------------------------------------------------------------------------------------------------------------------  Chemistries   Recent Labs Lab 09/23/15 2323 09/24/15 0534 09/25/15 0502 09/26/15 0420 09/26/15 2138  NA 139 137 138 136 136  K 3.5 3.7 3.7 3.5 3.5  CL 102 104 104 97* 96*  CO2 29 30 31  35* 32  GLUCOSE 150* 169* 127* 127* 137*  BUN 11 10 7 9 12   CREATININE 0.55 0.53 0.43* 0.44 0.52  CALCIUM 9.0 8.8* 8.7* 8.5* 8.5*   ------------------------------------------------------------------------------------------------------------------ estimated creatinine clearance is 65.6 mL/min (by C-G formula based on Cr of 0.52). ------------------------------------------------------------------------------------------------------------------ No results for input(s): TSH, T4TOTAL, T3FREE, THYROIDAB in the last 72  hours.  Invalid input(s): FREET3   Coagulation profile No results for input(s): INR, PROTIME in the last 168 hours. ------------------------------------------------------------------------------------------------------------------- No results for input(s): DDIMER in the last 72 hours. -------------------------------------------------------------------------------------------------------------------  Cardiac Enzymes  Recent Labs Lab 09/26/15 2138  TROPONINI 0.04*   ------------------------------------------------------------------------------------------------------------------ Invalid input(s): POCBNP  ---------------------------------------------------------------------------------------------------------------  Urinalysis    Component Value Date/Time   COLORURINE Straw 12/29/2013 0119   APPEARANCEUR Clear 12/29/2013 0119   LABSPEC 1.005 12/29/2013 0119   PHURINE 6.0 12/29/2013 0119   GLUCOSEU Negative 04/29/2015 1404   GLUCOSEU Negative 12/29/2013 0119   HGBUR Negative 12/29/2013 0119   BILIRUBINUR Positive* 04/29/2015 1404   BILIRUBINUR Negative 12/29/2013 0119   KETONESUR Negative 12/29/2013 0119   PROTEINUR Negative 12/29/2013 0119   NITRITE Negative 04/29/2015 1404   NITRITE Negative 12/29/2013 0119   LEUKOCYTESUR Trace* 04/29/2015 1404   LEUKOCYTESUR 1+ 12/29/2013 0119     RADIOLOGY: Dg Chest 2 View  09/26/2015  CLINICAL DATA:  Oxygen requirement post recent hip surgery 2 days prior. EXAM: CHEST  2 VIEW COMPARISON:  Preoperative exam 09/23/2015 FINDINGS: The cardiomediastinal contours are unchanged. Mild subsegmental atelectasis in the left mid and lower lung zone. Pulmonary vasculature is normal. No consolidation, pleural effusion, or pneumothorax. No acute osseous abnormalities are seen. IMPRESSION: Mild subsegmental atelectasis in the left lung base. Exam is otherwise unchanged. Electronically Signed   By: Jeb Levering M.D.   On: 09/26/2015 21:28   Ct Head  Wo Contrast  09/26/2015  CLINICAL DATA:  Increasing confusion left-sided facial droop EXAM: CT HEAD WITHOUT CONTRAST TECHNIQUE: Contiguous axial images were obtained from the base of the skull through the vertex without intravenous contrast. COMPARISON:  12/29/2013 FINDINGS: Bony calvarium is intact. Mild atrophic changes are noted. A stable lacunar infarct is noted within the basal ganglia on the left. Areas of decreased attenuation are noted within the deep white matter on the right adjacent to the lateral ventricle. These are new from the prior exam but likely represent more chronic ischemia. No findings to suggest acute infarct are noted. IMPRESSION: Chronic atrophic and ischemic changes without acute abnormality. Electronically Signed   By: Inez Catalina M.D.   On: 09/26/2015 15:46   Ct Angio Chest Pe W/cm &/or Wo Cm  09/26/2015  CLINICAL DATA:  Dyspnea.  Hypoxemia 2 days post hip arthroplasty. EXAM: CT ANGIOGRAPHY CHEST WITH CONTRAST TECHNIQUE: Multidetector CT imaging of the chest was performed using the standard protocol during bolus administration of intravenous contrast. Multiplanar CT image reconstructions and MIPs were obtained to evaluate the vascular anatomy. CONTRAST:  170mL OMNIPAQUE IOHEXOL 350 MG/ML SOLN COMPARISON:  Radiographs earlier this day.  FINDINGS: There are no filling defects within the pulmonary arteries to suggest pulmonary embolus. Normal caliber thoracic aorta with mild atherosclerosis. Small irregular plaque involving the transverse aorta. No dissection or acute aortic abnormality. Conventional branching pattern from the aortic arch. No mediastinal or hilar adenopathy. No pericardial effusion. No pulmonary edema. Subsegmental atelectasis in the lower lobes, lingula and right middle lobe. No consolidation. Right middle lobe 7 mm nodule is unchanged compared to chest CT 03/22/2013 and considered benign. No new pulmonary nodule. No acute abnormality in the included upper abdomen. There  are no acute or suspicious osseous abnormalities. Review of the MIP images confirms the above findings. IMPRESSION: 1. No pulmonary embolus. 2. Minimal scattered subsegmental atelectasis. 3. Benign right middle lobe pulmonary nodule, stable from 2014. Electronically Signed   By: Jeb Levering M.D.   On: 09/26/2015 22:17    EKG: Orders placed or performed during the hospital encounter of 09/23/15  . ED EKG  . ED EKG  . ED EKG  . ED EKG  . EKG 12-Lead  . EKG 12-Lead    IMPRESSION AND PLAN: 81 year old female patient with history of right hip hemiarthroplasty, hypertension, type 2 diabetes mellitus, hyperlipidemia, arthritis who was recently discharged to rehabilitation facility yesterday evening after having hip repair presented to the emergency room with difficulty breathing and shortness of breath. Patient was worked up with a CT angiogram of the chest which showed no pulmonary embolism. Subsegmental atelectasis was noted. Admitting diagnosis 1. Hypoxia 2. Dyspnea 3. Borderline troponin 4. Status post right hip hemiarthroplasty 5. Hypertension 6. Type 2 diabetes mellitus Treatment plan Admit patient under observation bed to telemetry Oxygen via nasal cannula at 4 Litre. Cycle serial troponin to rule out ischemia Physical therapy to continue Will increase Lasix dose to 40 mg daily Monitor patient for any arrhythmia  All the records are reviewed and case discussed with ED provider. Management plans discussed with the patient, family and they are in agreement.  CODE STATUS:FULL Code Status History    Date Active Date Inactive Code Status Order ID Comments User Context   09/24/2015  3:40 AM 09/26/2015  8:15 PM Full Code [id number]  Lance Coon, MD Inpatient       TOTAL TIME TAKING CARE OF THIS PATIENT: 50 minutes.    Saundra Shelling M.D on 09/27/2015 at 12:00 AM  Between 7am to 6pm - Pager - [phone]  After 6pm go to [email or web address] - password [email or web address]  Office  [phone]  CC: Primary care physician; Dion Body, MD

## 2015-09-27 NOTE — Discharge Summary (Signed)
[facility] [place] NAME: Mary [name]    MR#:  [id number]  DATE OF BIRTH:  1934-10-26  DATE OF ADMISSION:  09/26/2015 ADMITTING PHYSICIAN: Saundra Shelling, MD  DATE OF DISCHARGE: 09/27/2015  PRIMARY CARE PHYSICIAN: Dion Body, MD    ADMISSION DIAGNOSIS:  Dyspnea [R06.00] Hypoxia [R09.02]  DISCHARGE DIAGNOSIS:  Active Problems:   Hypoxia   Possible anxiety episode.  SECONDARY DIAGNOSIS:   Past Medical History  Diagnosis Date  . Diabetes ([place])   . Acid reflux   . Arthritis   . Depression   . Heart murmur   . HTN (hypertension)   . HLD (hyperlipidemia)   . Sleep apnea   . Asthma     HOSPITAL COURSE:   Patient was discharged on February 3 to a nursing home after having surgery for the fractured hip. She was sent with 2 L nasal cannula oxygen supplementation. After receiving over there within a few hours she was noted to have severe hypoxia and so sent back to emergency room. In ER CT scan of the chest to rule out any pulmonary embolism was done which was negative for any blood clots, any infections, infiltrates, pulmonary edema. Initially the patient required 4 L of oxygen. But gradually we were able to bring her down to 2 L after giving injection of Lasix. She did not had any wheezing and did not require any inhalers. He remained asymptomatic after that.  We are not making any changes in medications. She will need 2 L oxygen supplementation via nasal cannula continuously.  Other medical issues of hypertension, hypothyroidism, diabetes, hyperlipidemia- all remained under control and she was continued on her baseline medications.  DISCHARGE CONDITIONS:   Stable  CONSULTS OBTAINED:  Treatment Team:  Vaughan Basta, MD  DRUG ALLERGIES:   Allergies  Allergen Reactions  . Cephalexin Hives  . Nitrofurantoin     Other reaction(s): Other (See Comments) Other Reaction: measles-like lesions  . Other     Other  reaction(s): Other (See Comments) Uncoded Allergy. Allergen: microdantin, Other Reaction: Measle-like Lesions  . Sulfa Antibiotics Hives  . Atorvastatin Other (See Comments)    Muscle spasms    DISCHARGE MEDICATIONS:   Current Discharge Medication List    CONTINUE these medications which have NOT CHANGED   Details  amLODipine (NORVASC) 10 MG tablet Take 10 mg by mouth daily.     aspirin 81 MG tablet Take 81 mg by mouth daily.     enoxaparin (LOVENOX) 40 MG/0.4ML injection Inject 0.4 mLs (40 mg total) into the skin daily.    furosemide (LASIX) 20 MG tablet Take 20 mg by mouth daily.     hydrochlorothiazide (HYDRODIURIL) 12.5 MG tablet Take 12.5 mg by mouth daily.     levothyroxine (SYNTHROID, LEVOTHROID) 75 MCG tablet Take 75 mcg by mouth daily.     losartan (COZAAR) 100 MG tablet Take 100 mg by mouth daily.     lovastatin (MEVACOR) 40 MG tablet Take 40 mg by mouth at bedtime.     metFORMIN (GLUCOPHAGE-XR) 500 MG 24 hr tablet Take 500 mg by mouth daily.     omeprazole (PRILOSEC) 40 MG capsule Take 40 mg by mouth daily.     oxyCODONE (OXY IR/ROXICODONE) 5 MG immediate release tablet Take 1-2 tablets (5-10 mg total) by mouth every 3 (three) hours as needed for breakthrough pain. Qty: 30 tablet, Refills: 0    solifenacin (VESICARE) 10 MG tablet Take 1 tablet (10 mg total)  by mouth daily. Qty: 90 tablet, Refills: 12   Associated Diagnoses: Urinary frequency    vitamin B-12 (CYANOCOBALAMIN) 1000 MCG tablet Take 1,000 mcg by mouth daily.    vitamin E 400 UNIT capsule Take 400 Units by mouth daily.     conjugated estrogens (PREMARIN) vaginal cream Place 1 Applicatorful vaginally daily. Qty: 30 g, Refills: 12   Associated Diagnoses: Atrophic vaginitis    PARoxetine (PAXIL) 40 MG tablet Take by mouth daily.          DISCHARGE INSTRUCTIONS:    Follow-up with orthopedic doctor in 2 weeks.  If you experience worsening of your admission symptoms, develop shortness of  breath, life threatening emergency, suicidal or homicidal thoughts you must seek medical attention immediately by calling 911 or calling your MD immediately  if symptoms less severe.  You Must read complete instructions/literature along with all the possible adverse reactions/side effects for all the Medicines you take and that have been prescribed to you. Take any new Medicines after you have completely understood and accept all the possible adverse reactions/side effects.   Please note  You were cared for by a hospitalist during your hospital stay. If you have any questions about your discharge medications or the care you received while you were in the hospital after you are discharged, you can call the unit and asked to speak with the hospitalist on call if the hospitalist that took care of you is not available. Once you are discharged, your primary care physician will handle any further medical issues. Please note that NO REFILLS for any discharge medications will be authorized once you are discharged, as it is imperative that you return to your primary care physician (or establish a relationship with a primary care physician if you do not have one) for your aftercare needs so that they can reassess your need for medications and monitor your lab values.    Today   CHIEF COMPLAINT:   Chief Complaint  Patient presents with  . Altered Mental Status    HISTORY OF PRESENT ILLNESS:  Mary [name]  is a 81 y.o. female with a known history of diabetes mellitus type 2, GERD, arthritis, hypertension, hyperlipidemia, status post right hip hemiarthroplasty presented to the emergency room from [facility] rehabilitation facility. Patient was discharged to rehabilitation facility on Friday evening that is 09/26/2015. She was discharged to rehabilitation facility and oxygen via nasal cannula. After reaching the rehabilitation facility she felt short of breath and her oxygen saturation dropped to less than 80%. He  was transferred back to the emergency room and off oxygen her O2 saturations were around 73%. She was put on oxygen at 4 L by nasal cannula and oxygen saturation levels came up to 94%. No complaints of any chest pain. No orthopnea. No fever, chills or cough. Patient was worked up with a CT angiogram of the chest in the emergency room which showed no pulmonary embolism. First set of troponin was 0.04. Hospitalist service was consulted for further care.   VITAL SIGNS:  Blood pressure 165/66, pulse 76, temperature 98.5 F (36.9 C), temperature source Oral, resp. rate 18, height 5\' 6"  (1.676 m), weight 90.175 kg (198 lb 12.8 oz), SpO2 97 %.  I/O:   Intake/Output Summary (Last 24 hours) at 09/27/15 1038 Last data filed at 09/27/15 1006  Gross per 24 hour  Intake    120 ml  Output    700 ml  Net   -580 ml    PHYSICAL EXAMINATION:  GENERAL: 81 y.o.-year-old patient lying in the bed with no acute distress.  EYES: Pupils equal, round, reactive to light and accommodation. No scleral icterus. Extraocular muscles intact.  HEENT: Head atraumatic, normocephalic. Oropharynx and nasopharynx clear.  NECK: Supple, no jugular venous distention. No thyroid enlargement, no tenderness.  LUNGS: Normal breath sounds bilaterally, no wheezing, basal crepitations heard, rhonchi or crepitation. No use of accessory muscles of respiration.  CARDIOVASCULAR: S1, S2 normal. No murmurs, rubs, or gallops.  ABDOMEN: Soft, nontender, nondistended. Bowel sounds present. No organomegaly or mass.  EXTREMITIES: No pedal edema, cyanosis, or clubbing.  NEUROLOGIC: Cranial nerves II through XII are intact. Muscle strength 5/5 in all extremities. Sensation intact. Gait could not be assessed. PSYCHIATRIC: The patient is alert and oriented x 3.  SKIN: No obvious rash, lesion, or ulcer.    DATA REVIEW:   CBC  Recent Labs Lab 09/27/15 0702  WBC 7.8  HGB 10.0*  HCT 30.9*  PLT 141*    Chemistries   Recent  Labs Lab 09/27/15 0702  NA 137  K 3.3*  CL 96*  CO2 35*  GLUCOSE 133*  BUN 12  CREATININE 0.52  CALCIUM 8.4*    Cardiac Enzymes  Recent Labs Lab 09/27/15 0702  TROPONINI <0.03    Microbiology Results  Results for orders placed or performed during the hospital encounter of 09/23/15  Surgical pcr screen     Status: Abnormal   Collection Time: 09/24/15  1:55 PM  Result Value Ref Range Status   MRSA, PCR NEGATIVE NEGATIVE Final   Staphylococcus aureus POSITIVE (A) NEGATIVE Final    Comment:        The Xpert SA Assay (FDA approved for NASAL specimens in patients over 71 years of age), is one component of a comprehensive surveillance program.  Test performance has been validated by [facility] for patients greater than or equal to 42 year old. It is not intended to diagnose infection nor to guide or monitor treatment.     RADIOLOGY:  Dg Chest 2 View  09/26/2015  CLINICAL DATA:  Oxygen requirement post recent hip surgery 2 days prior. EXAM: CHEST  2 VIEW COMPARISON:  Preoperative exam 09/23/2015 FINDINGS: The cardiomediastinal contours are unchanged. Mild subsegmental atelectasis in the left mid and lower lung zone. Pulmonary vasculature is normal. No consolidation, pleural effusion, or pneumothorax. No acute osseous abnormalities are seen. IMPRESSION: Mild subsegmental atelectasis in the left lung base. Exam is otherwise unchanged. Electronically Signed   By: Jeb Levering M.D.   On: 09/26/2015 21:28   Ct Head Wo Contrast  09/26/2015  CLINICAL DATA:  Increasing confusion left-sided facial droop EXAM: CT HEAD WITHOUT CONTRAST TECHNIQUE: Contiguous axial images were obtained from the base of the skull through the vertex without intravenous contrast. COMPARISON:  12/29/2013 FINDINGS: Bony calvarium is intact. Mild atrophic changes are noted. A stable lacunar infarct is noted within the basal ganglia on the left. Areas of decreased attenuation are noted within the deep white  matter on the right adjacent to the lateral ventricle. These are new from the prior exam but likely represent more chronic ischemia. No findings to suggest acute infarct are noted. IMPRESSION: Chronic atrophic and ischemic changes without acute abnormality. Electronically Signed   By: Inez Catalina M.D.   On: 09/26/2015 15:46   Ct Angio Chest Pe W/cm &/or Wo Cm  09/26/2015  CLINICAL DATA:  Dyspnea.  Hypoxemia 2 days post hip arthroplasty. EXAM: CT ANGIOGRAPHY CHEST WITH CONTRAST TECHNIQUE: Multidetector CT imaging of the  chest was performed using the standard protocol during bolus administration of intravenous contrast. Multiplanar CT image reconstructions and MIPs were obtained to evaluate the vascular anatomy. CONTRAST:  131mL OMNIPAQUE IOHEXOL 350 MG/ML SOLN COMPARISON:  Radiographs earlier this day. FINDINGS: There are no filling defects within the pulmonary arteries to suggest pulmonary embolus. Normal caliber thoracic aorta with mild atherosclerosis. Small irregular plaque involving the transverse aorta. No dissection or acute aortic abnormality. Conventional branching pattern from the aortic arch. No mediastinal or hilar adenopathy. No pericardial effusion. No pulmonary edema. Subsegmental atelectasis in the lower lobes, lingula and right middle lobe. No consolidation. Right middle lobe 7 mm nodule is unchanged compared to chest CT 03/22/2013 and considered benign. No new pulmonary nodule. No acute abnormality in the included upper abdomen. There are no acute or suspicious osseous abnormalities. Review of the MIP images confirms the above findings. IMPRESSION: 1. No pulmonary embolus. 2. Minimal scattered subsegmental atelectasis. 3. Benign right middle lobe pulmonary nodule, stable from 2014. Electronically Signed   By: Jeb Levering M.D.   On: 09/26/2015 22:17     Management plans discussed with the patient, family and they are in agreement.  CODE STATUS:     Code Status Orders        Start      Ordered   09/27/15 0105  Full code   Continuous     09/27/15 0104    Code Status History    Date Active Date Inactive Code Status Order ID Comments User Context   09/24/2015  3:40 AM 09/26/2015  8:15 PM Full Code [id number]  Lance Coon, MD Inpatient    Advance Directive Documentation        Most Recent Value   Type of Advance Directive  Healthcare Power of Attorney   Pre-existing out of facility DNR order (yellow form or pink MOST form)     "MOST" Form in Place?        TOTAL TIME TAKING CARE OF THIS PATIENT: 35 minutes.    Vaughan Basta M.D on 09/27/2015 at 10:38 AM  Between 7am to 6pm - Pager - [phone]  After 6pm go to [email or web address] - password [email or web address] [facility]  Office  [phone]  CC: Primary care physician; Dion Body, MD   Note: This dictation was prepared with Dragon dictation along with smaller phrase technology. Any transcriptional errors that result from this process are unintentional.

## 2015-09-27 NOTE — Progress Notes (Signed)
Skin assessed with Rubin Payor, RN

## 2015-09-27 NOTE — Progress Notes (Signed)
Ems on the floor to transport patient to [facility]. Patient to go on 02 at 2l. No distress noted. No c/o pain

## 2015-09-27 NOTE — Progress Notes (Signed)
Clinical Social Worker informed by Vaughan Basta, MD that patient is medically ready to discharge back to SNF, Patient and Husband are in a agreement with plan.  Call to [facility]  to confirm that patient's bed is ready. Provided patient's room number 207-A and number to call for report [phone]. All discharge information faxed to  Facility. No Rx's.   RN will call report and patient will discharge to [facility] via EMS.  Casimer Lanius. [place] Work Department [phone] 11:26 AM

## 2015-09-28 DIAGNOSIS — E119 Type 2 diabetes mellitus without complications: Secondary | ICD-10-CM | POA: Diagnosis not present

## 2015-09-28 LAB — GLUCOSE, CAPILLARY
GLUCOSE-CAPILLARY: 127 mg/dL — AB (ref 65–99)
GLUCOSE-CAPILLARY: 141 mg/dL — AB (ref 65–99)
GLUCOSE-CAPILLARY: 116 mg/dL — AB (ref 65–99)
Glucose-Capillary: 139 mg/dL — ABNORMAL HIGH (ref 65–99)

## 2015-09-29 DIAGNOSIS — E119 Type 2 diabetes mellitus without complications: Secondary | ICD-10-CM | POA: Diagnosis not present

## 2015-09-29 LAB — GLUCOSE, CAPILLARY
GLUCOSE-CAPILLARY: 114 mg/dL — AB (ref 65–99)
GLUCOSE-CAPILLARY: 127 mg/dL — AB (ref 65–99)
GLUCOSE-CAPILLARY: 153 mg/dL — AB (ref 65–99)
Glucose-Capillary: 141 mg/dL — ABNORMAL HIGH (ref 65–99)

## 2015-09-29 LAB — SURGICAL PATHOLOGY

## 2015-09-30 DIAGNOSIS — E119 Type 2 diabetes mellitus without complications: Secondary | ICD-10-CM | POA: Diagnosis not present

## 2015-09-30 LAB — GLUCOSE, CAPILLARY
GLUCOSE-CAPILLARY: 145 mg/dL — AB (ref 65–99)
GLUCOSE-CAPILLARY: 164 mg/dL — AB (ref 65–99)
GLUCOSE-CAPILLARY: 123 mg/dL — AB (ref 65–99)

## 2015-10-01 LAB — GLUCOSE, CAPILLARY
GLUCOSE-CAPILLARY: 158 mg/dL — AB (ref 65–99)
GLUCOSE-CAPILLARY: 141 mg/dL — AB (ref 65–99)
Glucose-Capillary: 140 mg/dL — ABNORMAL HIGH (ref 65–99)
Glucose-Capillary: 138 mg/dL — ABNORMAL HIGH (ref 65–99)
Glucose-Capillary: 127 mg/dL — ABNORMAL HIGH (ref 65–99)

## 2015-10-02 DIAGNOSIS — E119 Type 2 diabetes mellitus without complications: Secondary | ICD-10-CM | POA: Diagnosis not present

## 2015-10-02 LAB — GLUCOSE, CAPILLARY
GLUCOSE-CAPILLARY: 135 mg/dL — AB (ref 65–99)
GLUCOSE-CAPILLARY: 129 mg/dL — AB (ref 65–99)
GLUCOSE-CAPILLARY: 118 mg/dL — AB (ref 65–99)
Glucose-Capillary: 130 mg/dL — ABNORMAL HIGH (ref 65–99)

## 2015-10-03 DIAGNOSIS — E119 Type 2 diabetes mellitus without complications: Secondary | ICD-10-CM | POA: Diagnosis not present

## 2015-10-05 LAB — GLUCOSE, CAPILLARY
GLUCOSE-CAPILLARY: 143 mg/dL — AB (ref 65–99)
GLUCOSE-CAPILLARY: 126 mg/dL — AB (ref 65–99)
GLUCOSE-CAPILLARY: 141 mg/dL — AB (ref 65–99)
GLUCOSE-CAPILLARY: 137 mg/dL — AB (ref 65–99)
GLUCOSE-CAPILLARY: 150 mg/dL — AB (ref 65–99)
GLUCOSE-CAPILLARY: 133 mg/dL — AB (ref 65–99)
GLUCOSE-CAPILLARY: 132 mg/dL — AB (ref 65–99)
GLUCOSE-CAPILLARY: 116 mg/dL — AB (ref 65–99)
Glucose-Capillary: 142 mg/dL — ABNORMAL HIGH (ref 65–99)
Glucose-Capillary: 185 mg/dL — ABNORMAL HIGH (ref 65–99)
Glucose-Capillary: 135 mg/dL — ABNORMAL HIGH (ref 65–99)
Glucose-Capillary: 138 mg/dL — ABNORMAL HIGH (ref 65–99)

## 2015-10-06 DIAGNOSIS — E119 Type 2 diabetes mellitus without complications: Secondary | ICD-10-CM | POA: Diagnosis not present

## 2015-10-06 LAB — GLUCOSE, CAPILLARY
GLUCOSE-CAPILLARY: 127 mg/dL — AB (ref 65–99)
GLUCOSE-CAPILLARY: 132 mg/dL — AB (ref 65–99)
GLUCOSE-CAPILLARY: 119 mg/dL — AB (ref 65–99)
Glucose-Capillary: 134 mg/dL — ABNORMAL HIGH (ref 65–99)
Glucose-Capillary: 157 mg/dL — ABNORMAL HIGH (ref 65–99)

## 2015-10-07 DIAGNOSIS — E119 Type 2 diabetes mellitus without complications: Secondary | ICD-10-CM | POA: Diagnosis not present

## 2015-10-07 LAB — GLUCOSE, CAPILLARY
GLUCOSE-CAPILLARY: 128 mg/dL — AB (ref 65–99)
GLUCOSE-CAPILLARY: 174 mg/dL — AB (ref 65–99)
Glucose-Capillary: 129 mg/dL — ABNORMAL HIGH (ref 65–99)

## 2015-10-07 LAB — BASIC METABOLIC PANEL
ANION GAP: 9 (ref 5–15)
BUN: 12 mg/dL (ref 6–20)
CALCIUM: 9.6 mg/dL (ref 8.9–10.3)
CO2: 37 mmol/L — ABNORMAL HIGH (ref 22–32)
Chloride: 91 mmol/L — ABNORMAL LOW (ref 101–111)
Creatinine, Ser: 0.61 mg/dL (ref 0.44–1.00)
GFR calc Af Amer: >60 mL/min (ref >60)
Glucose, Bld: 141 mg/dL — ABNORMAL HIGH (ref 65–99)
POTASSIUM: 3.1 mmol/L — AB (ref 3.5–5.1)
SODIUM: 137 mmol/L (ref 135–145)

## 2015-10-09 DIAGNOSIS — E119 Type 2 diabetes mellitus without complications: Secondary | ICD-10-CM | POA: Diagnosis not present

## 2015-10-09 LAB — GLUCOSE, CAPILLARY
GLUCOSE-CAPILLARY: 119 mg/dL — AB (ref 65–99)
Glucose-Capillary: 112 mg/dL — ABNORMAL HIGH (ref 65–99)
Glucose-Capillary: 184 mg/dL — ABNORMAL HIGH (ref 65–99)
Glucose-Capillary: 126 mg/dL — ABNORMAL HIGH (ref 65–99)

## 2015-10-10 DIAGNOSIS — E119 Type 2 diabetes mellitus without complications: Secondary | ICD-10-CM | POA: Diagnosis not present

## 2015-10-10 DIAGNOSIS — Z96649 Presence of unspecified artificial hip joint: Secondary | ICD-10-CM | POA: Insufficient documentation

## 2015-10-10 LAB — GLUCOSE, CAPILLARY
Glucose-Capillary: 139 mg/dL — ABNORMAL HIGH (ref 65–99)
Glucose-Capillary: 157 mg/dL — ABNORMAL HIGH (ref 65–99)
Glucose-Capillary: 171 mg/dL — ABNORMAL HIGH (ref 65–99)

## 2015-10-11 DIAGNOSIS — E119 Type 2 diabetes mellitus without complications: Secondary | ICD-10-CM | POA: Diagnosis not present

## 2015-10-11 LAB — GLUCOSE, CAPILLARY
Glucose-Capillary: 153 mg/dL — ABNORMAL HIGH (ref 65–99)
Glucose-Capillary: 138 mg/dL — ABNORMAL HIGH (ref 65–99)

## 2015-10-12 DIAGNOSIS — E119 Type 2 diabetes mellitus without complications: Secondary | ICD-10-CM | POA: Diagnosis not present

## 2015-10-12 LAB — GLUCOSE, CAPILLARY
GLUCOSE-CAPILLARY: 164 mg/dL — AB (ref 65–99)
Glucose-Capillary: 163 mg/dL — ABNORMAL HIGH (ref 65–99)

## 2015-10-13 DIAGNOSIS — E119 Type 2 diabetes mellitus without complications: Secondary | ICD-10-CM | POA: Diagnosis not present

## 2015-10-13 LAB — GLUCOSE, CAPILLARY: GLUCOSE-CAPILLARY: 121 mg/dL — AB (ref 65–99)

## 2015-10-14 LAB — GLUCOSE, CAPILLARY
Glucose-Capillary: 140 mg/dL — ABNORMAL HIGH (ref 65–99)
Glucose-Capillary: 125 mg/dL — ABNORMAL HIGH (ref 65–99)
Glucose-Capillary: 157 mg/dL — ABNORMAL HIGH (ref 65–99)

## 2015-10-15 DIAGNOSIS — E119 Type 2 diabetes mellitus without complications: Secondary | ICD-10-CM | POA: Diagnosis not present

## 2015-10-15 LAB — GLUCOSE, CAPILLARY
GLUCOSE-CAPILLARY: 152 mg/dL — AB (ref 65–99)
Glucose-Capillary: 130 mg/dL — ABNORMAL HIGH (ref 65–99)

## 2015-10-16 DIAGNOSIS — E119 Type 2 diabetes mellitus without complications: Secondary | ICD-10-CM | POA: Diagnosis not present

## 2015-10-16 LAB — GLUCOSE, CAPILLARY
GLUCOSE-CAPILLARY: 134 mg/dL — AB (ref 65–99)
Glucose-Capillary: 128 mg/dL — ABNORMAL HIGH (ref 65–99)

## 2015-10-17 DIAGNOSIS — E119 Type 2 diabetes mellitus without complications: Secondary | ICD-10-CM | POA: Diagnosis not present

## 2015-10-18 LAB — GLUCOSE, CAPILLARY: GLUCOSE-CAPILLARY: 127 mg/dL — AB (ref 65–99)

## 2015-10-24 ENCOUNTER — Other Ambulatory Visit
Admission: RE | Admit: 2015-10-24 | Discharge: 2015-10-24 | Disposition: A | Discharge status: Home / Self Care | Payer: Medicare Other | Source: Ambulatory Visit | Attending: Nurse Practitioner | Admitting: Nurse Practitioner

## 2015-10-24 DIAGNOSIS — R1084 Generalized abdominal pain: Secondary | ICD-10-CM | POA: Insufficient documentation

## 2015-10-24 DIAGNOSIS — R197 Diarrhea, unspecified: Secondary | ICD-10-CM | POA: Insufficient documentation

## 2015-10-24 LAB — GASTROINTESTINAL PANEL BY PCR, STOOL (REPLACES STOOL CULTURE)

## 2015-10-24 LAB — C DIFFICILE QUICK SCREEN W PCR REFLEX
C Diff antigen: POSITIVE — AB
C Diff interpretation: POSITIVE
C Diff toxin: POSITIVE — AB

## 2015-12-25 ENCOUNTER — Telehealth: Payer: Self-pay | Admitting: Urology

## 2015-12-25 NOTE — Telephone Encounter (Signed)
Patient said that you told her she could call anytime to get samples. She is having trouble holding her water? She has not been seen since 04-2015 and does  Not have a follow up till sept of this year. I told her that you may want to see her before you could just give her samples.  Please advise   Thanks,  Sharyn Lull

## 2015-12-25 NOTE — Telephone Encounter (Signed)
Called patient and let her know  Mary [name] for [place] to log in the book   [place]

## 2015-12-25 NOTE — Telephone Encounter (Signed)
She may have Vesicare 10 mg samples.

## 2016-03-11 DIAGNOSIS — Z87898 Personal history of other specified conditions: Secondary | ICD-10-CM | POA: Insufficient documentation

## 2016-05-19 ENCOUNTER — Ambulatory Visit (INDEPENDENT_AMBULATORY_CARE_PROVIDER_SITE_OTHER): Payer: Medicare Other | Admitting: Urology

## 2016-05-19 ENCOUNTER — Encounter: Payer: Self-pay | Admitting: Urology

## 2016-05-19 VITALS — BP 211/72 | HR 67 | Ht 66.0 in | Wt 187.0 lb

## 2016-05-19 DIAGNOSIS — R351 Nocturia: Secondary | ICD-10-CM

## 2016-05-19 DIAGNOSIS — F419 Anxiety disorder, unspecified: Secondary | ICD-10-CM | POA: Insufficient documentation

## 2016-05-19 DIAGNOSIS — E7800 Pure hypercholesterolemia, unspecified: Secondary | ICD-10-CM | POA: Insufficient documentation

## 2016-05-19 DIAGNOSIS — G4700 Insomnia, unspecified: Secondary | ICD-10-CM | POA: Insufficient documentation

## 2016-05-19 DIAGNOSIS — R350 Frequency of micturition: Secondary | ICD-10-CM

## 2016-05-19 DIAGNOSIS — N952 Postmenopausal atrophic vaginitis: Secondary | ICD-10-CM | POA: Diagnosis not present

## 2016-05-19 LAB — BLADDER SCAN AMB NON-IMAGING

## 2016-05-19 MED ORDER — SOLIFENACIN SUCCINATE 10 MG PO TABS: 10.0000 mg | ORAL_TABLET | Freq: Every day | ORAL | 4 refills | Status: DC

## 2016-05-19 MED ORDER — ESTROGENS, CONJUGATED 0.625 MG/GM VA CREA: 1.0000 | TOPICAL_CREAM | Freq: Every day | VAGINAL | 12 refills | Status: DC

## 2016-05-19 NOTE — Progress Notes (Signed)
05/19/2016 10:34 AM   Mary [name] 03/10/1935 [id number]  Referring provider: Dion Body, MD [place] [facility] [place] [address], [place] [address]  Chief Complaint  Patient presents with  . Urinary Frequency    1year w/PVR    HPI: Patient is a 81 year old white female who presented today for a one year follow up for nocturia, frequency and vaginal atrophy.  Nocturia Patient is getting up two times a night to urinate.  She admits that she has not been sleeping well since her husband's passing in June.    Frequency Patient is finding the Vesicare helpful and would like to continue the medication.  Her PVR today is 0 mL.  Vaginal atrophy Patient is using the vaginal estrogen cream three nights weekly.    Patient is quite tearful during today's visit.  She did assure me that she has family and friends around to support her.       PMH: Past Medical History:  Diagnosis Date  . Acid reflux   . Arthritis   . Asthma   . Depression   . Diabetes ([place])   . Heart murmur   . HLD (hyperlipidemia)   . HTN (hypertension)   . Sleep apnea     Surgical History: Past Surgical History:  Procedure Laterality Date  . ABDOMINAL HYSTERECTOMY    . CYSTOSCOPY    . HIP ARTHROPLASTY Right 09/24/2015   Procedure: ARTHROPLASTY BIPOLAR HIP (HEMIARTHROPLASTY);  Surgeon: Corky Mull, MD;  Location: [facility] ORS;  Service: Orthopedics;  Laterality: Right;  . KNEE ARTHROSCOPY W/ AUTOGENOUS CARTILAGE IMPLANTATION (ACI) PROCEDURE    . VARICOSE VEIN SURGERY      Home Medications:    Medication List       Accurate as of 05/19/16 10:34 AM. Always use your most recent med list.          amLODipine 10 MG tablet Commonly known as:  NORVASC Take 10 mg by mouth daily.   aspirin 81 MG tablet Take 81 mg by mouth daily.   conjugated estrogens vaginal cream Commonly known as:  PREMARIN Place 1 Applicatorful vaginally daily.   furosemide 20 MG tablet Commonly known as:   LASIX Take 20 mg by mouth daily.   hydrochlorothiazide 12.5 MG tablet Commonly known as:  HYDRODIURIL Take 12.5 mg by mouth daily.   levothyroxine 75 MCG tablet Commonly known as:  SYNTHROID, LEVOTHROID Take 75 mcg by mouth daily.   losartan 100 MG tablet Commonly known as:  COZAAR Take 100 mg by mouth daily.   lovastatin 40 MG tablet Commonly known as:  MEVACOR Take 40 mg by mouth at bedtime.   metFORMIN 500 MG 24 hr tablet Commonly known as:  GLUCOPHAGE-XR Take 500 mg by mouth daily.   omeprazole 40 MG capsule Commonly known as:  PRILOSEC Take 40 mg by mouth daily.   oxyCODONE 5 MG immediate release tablet Commonly known as:  Oxy IR/ROXICODONE Take 1-2 tablets (5-10 mg total) by mouth every 3 (three) hours as needed for breakthrough pain.   solifenacin 10 MG tablet Commonly known as:  VESICARE Take 1 tablet (10 mg total) by mouth daily.   vitamin B-12 1000 MCG tablet Commonly known as:  CYANOCOBALAMIN Take 1,000 mcg by mouth daily.   vitamin E 400 UNIT capsule Take 400 Units by mouth daily.       Allergies:  Allergies  Allergen Reactions  . Cephalexin Hives  . Nitrofurantoin     Other reaction(s): Other (  See Comments) Other Reaction: measles-like lesions  . Other     Other reaction(s): Other (See Comments) Uncoded Allergy. Allergen: microdantin, Other Reaction: Measle-like Lesions  . Sulfa Antibiotics Hives  . Atorvastatin Other (See Comments)    Muscle spasms    Family History: Family History  Problem Relation Age of Onset  . Kidney disease Brother     also nephew  . Prostate cancer Neg Hx   . Bladder Cancer Neg Hx   . Breast cancer Neg Hx     Social History:  reports that she has never smoked. She has never used smokeless tobacco. She reports that she does not drink alcohol or use drugs.  ROS: UROLOGY Frequent Urination?: Yes Hard to postpone urination?: No Burning/pain with urination?: No Get up at night to urinate?: Yes Leakage of  urine?: No Urine stream starts and stops?: No Trouble starting stream?: No Do you have to strain to urinate?: No Blood in urine?: No Urinary tract infection?: No Sexually transmitted disease?: No Injury to kidneys or bladder?: No Painful intercourse?: No Weak stream?: No Currently pregnant?: No Vaginal bleeding?: No Last menstrual period?: n  Gastrointestinal Nausea?: No Vomiting?: No Indigestion/heartburn?: Yes Diarrhea?: No Constipation?: No  Constitutional Fever: No Night sweats?: No Weight loss?: No Fatigue?: No  Skin Skin rash/lesions?: No Itching?: No  Eyes Blurred vision?: No Double vision?: No  Ears/Nose/Throat Sore throat?: No Sinus problems?: Yes  Hematologic/Lymphatic Swollen glands?: No Easy bruising?: No  Cardiovascular Leg swelling?: No Chest pain?: No  Respiratory Cough?: No Shortness of breath?: No  Endocrine Excessive thirst?: No  Musculoskeletal Back pain?: Yes Joint pain?: Yes  Neurological Headaches?: Yes Dizziness?: No  Psychologic Depression?: Yes Anxiety?: No  Physical Exam: BP (!) 211/72   Pulse 67   Ht 5\' 6"  (1.676 m)   Wt 187 lb (84.8 kg)   BMI 30.18 kg/m   Constitutional: Well nourished. Alert and oriented, No acute distress. HEENT: [place] AT, moist mucus membranes. Trachea midline, no masses. Cardiovascular: No clubbing, cyanosis, or edema. Respiratory: Normal respiratory effort, no increased work of breathing. GI: Abdomen is soft, non tender, non distended, no abdominal masses. Liver and spleen not palpable.  No hernias appreciated.  Stool sample for occult testing is not indicated.   GU: No CVA tenderness.  No bladder fullness or masses.  Atrophic external genitalia, normal pubic hair distribution, no lesions.  Normal urethral meatus, no lesions, no prolapse, no discharge.   No urethral masses, tenderness and/or tenderness. No bladder fullness, tenderness or masses. Pale vagina mucosa, poor estrogen effect, no  discharge, no lesions, good pelvic support, no cystocele or rectocele noted.  Cervix and uterus are surgically absent.  No pelvic masse noted.  Anus and perineum are without rashes or lesions.    Skin: No rashes, bruises or suspicious lesions. Lymph: No cervical or inguinal adenopathy. Neurologic: Grossly intact, no focal deficits, moving all 4 extremities. Psychiatric: Normal mood and affect.  Laboratory Data:  Lab Results  Component Value Date   WBC 7.8 09/27/2015   HGB 10.0 (L) 09/27/2015   HCT 30.9 (L) 09/27/2015   MCV 87.8 09/27/2015   PLT 141 (L) 09/27/2015    Lab Results  Component Value Date   CREATININE 0.61 10/07/2015    Pertinent Imaging: Results for SUJEI, RYLE (MRN [id number]) as of 05/24/2016 21:43  Ref. Range 05/19/2016 10:20  Scan Result Unknown 70ml    Assessment & Plan:    1. Frequency  - continue Vesicare 10 mg daily-refills given  -  patient's husband passed in June 2017-financial concerns, so will provide samples when available  - BLADDER SCAN AMB NON-IMAGING  - RTC in one year PVR and symptom recheck  2. Nocturia  - Patient is not sleeping well due to her husband's passing  - continue the Vesicare 10 mg daily  - RTC in one year PVR and symptom recheck  3. Atrophic vaginitis  - Patient was given a sample of vaginal estrogen cream (PREMARIN) and will continue to apply 0.5mg  (pea-sized amount)just inside the vaginal introitus with a finger-tip on  Monday, Wednesday and Friday nights.  - I have sent a prescription into her pharmacy.    - She is to contact the office if she has trouble filling the medication.    - She will RTC in one year for exam.     Return in about 1 year (around 05/19/2017) for PVR and exam.  Zara Council, [facility]  [facility] [address] [place], [place] [address] [phone]

## 2016-07-27 ENCOUNTER — Other Ambulatory Visit: Payer: Self-pay | Admitting: Family Medicine

## 2016-07-27 DIAGNOSIS — M79605 Pain in left leg: Secondary | ICD-10-CM

## 2016-07-28 ENCOUNTER — Ambulatory Visit
Admission: RE | Admit: 2016-07-28 | Discharge: 2016-07-28 | Disposition: A | Discharge status: Home / Self Care | Payer: Medicare Other | Source: Ambulatory Visit | Attending: Family Medicine | Admitting: Family Medicine

## 2016-07-28 DIAGNOSIS — M7122 Synovial cyst of popliteal space [Baker], left knee: Secondary | ICD-10-CM | POA: Insufficient documentation

## 2016-07-28 DIAGNOSIS — M79605 Pain in left leg: Secondary | ICD-10-CM | POA: Diagnosis not present

## 2016-09-23 ENCOUNTER — Other Ambulatory Visit: Payer: Self-pay | Admitting: Family Medicine

## 2016-09-23 DIAGNOSIS — Z1231 Encounter for screening mammogram for malignant neoplasm of breast: Secondary | ICD-10-CM

## 2016-10-26 ENCOUNTER — Ambulatory Visit
Admission: RE | Admit: 2016-10-26 | Discharge: 2016-10-26 | Disposition: A | Discharge status: Home / Self Care | Payer: Medicare Other | Source: Ambulatory Visit | Attending: Family Medicine | Admitting: Family Medicine

## 2016-10-26 DIAGNOSIS — Z1231 Encounter for screening mammogram for malignant neoplasm of breast: Secondary | ICD-10-CM | POA: Insufficient documentation

## 2017-01-06 ENCOUNTER — Emergency Department: Payer: Medicare Other

## 2017-01-06 ENCOUNTER — Encounter: Payer: Self-pay | Admitting: Emergency Medicine

## 2017-01-06 ENCOUNTER — Emergency Department
Admission: EM | Admit: 2017-01-06 | Discharge: 2017-01-06 | Disposition: A | Discharge status: Home / Self Care | Payer: Medicare Other | Attending: Emergency Medicine | Admitting: Emergency Medicine

## 2017-01-06 DIAGNOSIS — Z79899 Other long term (current) drug therapy: Secondary | ICD-10-CM | POA: Diagnosis not present

## 2017-01-06 DIAGNOSIS — J45909 Unspecified asthma, uncomplicated: Secondary | ICD-10-CM | POA: Diagnosis not present

## 2017-01-06 DIAGNOSIS — M7122 Synovial cyst of popliteal space [Baker], left knee: Secondary | ICD-10-CM | POA: Diagnosis not present

## 2017-01-06 DIAGNOSIS — Y929 Unspecified place or not applicable: Secondary | ICD-10-CM | POA: Insufficient documentation

## 2017-01-06 DIAGNOSIS — S8001XA Contusion of right knee, initial encounter: Secondary | ICD-10-CM | POA: Diagnosis not present

## 2017-01-06 DIAGNOSIS — Y999 Unspecified external cause status: Secondary | ICD-10-CM | POA: Diagnosis not present

## 2017-01-06 DIAGNOSIS — I10 Essential (primary) hypertension: Secondary | ICD-10-CM | POA: Insufficient documentation

## 2017-01-06 DIAGNOSIS — Y9301 Activity, walking, marching and hiking: Secondary | ICD-10-CM | POA: Insufficient documentation

## 2017-01-06 DIAGNOSIS — Z7984 Long term (current) use of oral hypoglycemic drugs: Secondary | ICD-10-CM | POA: Diagnosis not present

## 2017-01-06 DIAGNOSIS — E039 Hypothyroidism, unspecified: Secondary | ICD-10-CM | POA: Diagnosis not present

## 2017-01-06 DIAGNOSIS — M1712 Unilateral primary osteoarthritis, left knee: Secondary | ICD-10-CM | POA: Diagnosis not present

## 2017-01-06 DIAGNOSIS — Z7982 Long term (current) use of aspirin: Secondary | ICD-10-CM | POA: Insufficient documentation

## 2017-01-06 DIAGNOSIS — E119 Type 2 diabetes mellitus without complications: Secondary | ICD-10-CM | POA: Diagnosis not present

## 2017-01-06 DIAGNOSIS — W1839XA Other fall on same level, initial encounter: Secondary | ICD-10-CM | POA: Insufficient documentation

## 2017-01-06 DIAGNOSIS — S8991XA Unspecified injury of right lower leg, initial encounter: Secondary | ICD-10-CM | POA: Diagnosis present

## 2017-01-06 DIAGNOSIS — R609 Edema, unspecified: Secondary | ICD-10-CM

## 2017-01-06 LAB — URINALYSIS, COMPLETE (UACMP) WITH MICROSCOPIC
BILIRUBIN URINE: NEGATIVE
Bacteria, UA: NONE SEEN
GLUCOSE, UA: NEGATIVE mg/dL
HGB URINE DIPSTICK: NEGATIVE
KETONES UR: NEGATIVE mg/dL
LEUKOCYTES UA: NEGATIVE
Nitrite: NEGATIVE
PROTEIN: NEGATIVE mg/dL
Specific Gravity, Urine: 1.004 — ABNORMAL LOW (ref 1.005–1.030)
Squamous Epithelial / LPF: NONE SEEN
pH: 7 (ref 5.0–8.0)

## 2017-01-06 LAB — CBC WITH DIFFERENTIAL/PLATELET
BASOS ABS: 0.1 10*3/uL (ref 0–0.1)
Basophils Relative: 1 %
EOS ABS: 0.2 10*3/uL (ref 0–0.7)
EOS PCT: 3 %
HCT: 37.7 % (ref 35.0–47.0)
Hemoglobin: 12.1 g/dL (ref 12.0–16.0)
Lymphocytes Relative: 21 %
Lymphs Abs: 1.1 10*3/uL (ref 1.0–3.6)
MCH: 27.1 pg (ref 26.0–34.0)
MCHC: 32.1 g/dL (ref 32.0–36.0)
MCV: 84.2 fL (ref 80.0–100.0)
Monocytes Absolute: 0.4 10*3/uL (ref 0.2–0.9)
Monocytes Relative: 7 %
NEUTROS PCT: 68 %
Neutro Abs: 3.7 10*3/uL (ref 1.4–6.5)
PLATELETS: 197 10*3/uL (ref 150–440)
RBC: 4.48 MIL/uL (ref 3.80–5.20)
RDW: 15.6 % — ABNORMAL HIGH (ref 11.5–14.5)
WBC: 5.5 10*3/uL (ref 3.6–11.0)

## 2017-01-06 LAB — BASIC METABOLIC PANEL
ANION GAP: 8 (ref 5–15)
BUN: 12 mg/dL (ref 6–20)
CALCIUM: 9.9 mg/dL (ref 8.9–10.3)
CHLORIDE: 99 mmol/L — AB (ref 101–111)
CO2: 33 mmol/L — ABNORMAL HIGH (ref 22–32)
CREATININE: 0.64 mg/dL (ref 0.44–1.00)
GFR calc non Af Amer: >60 mL/min (ref >60)
Glucose, Bld: 138 mg/dL — ABNORMAL HIGH (ref 65–99)
Potassium: 3.6 mmol/L (ref 3.5–5.1)
SODIUM: 140 mmol/L (ref 135–145)

## 2017-01-06 LAB — TROPONIN I

## 2017-01-06 NOTE — ED Triage Notes (Signed)
Patient from home via [facility]. Reports falling today with no known injury. Reports her legs have had increased swelling and weakness for several weeks. Patient has history of CHF and DM. EMS CBG 171. Patient alert and oriented x4, complaining of generalized leg pain.

## 2017-01-06 NOTE — ED Notes (Signed)
Patient transported to x-ray. ?

## 2017-01-06 NOTE — Discharge Instructions (Signed)
Your tests today do not show any acute issues.  Follow up with your doctor for continued monitoring of your symptoms.

## 2017-01-06 NOTE — ED Provider Notes (Signed)
[facility] Emergency Department Provider Note  ____________________________________________  Time seen: Approximately 11:25 AM  I have reviewed the triage vital signs and the nursing notes.   HISTORY  Chief Complaint Fall and Weakness    HPI Mary [name] is a 82 y.o. female who complains of bilateral knee pain after fall this morning. She reports she was walking along and then her legs just "gave out". She denies any preceding dizziness or unsteadiness on her feet or change in balance or coordination. No chest pain abdominal pain or back pain. She reports chronic shortness of breath for the past 2 years which is unchanged. She also reports gradual onset of bilateral lower extremity swelling over the past 2 weeks. She saw her primary care doctor yesterday who recommended compression stockings. She does report a history of "vein stripping" in the left lower extremity. Did not hit her head, not lose consciousness.   Presently her only acute symptoms are bilateral knee pain after the fall.     Past Medical History:  Diagnosis Date  . Acid reflux   . Arthritis   . Asthma   . Depression   . Diabetes ([place])   . Heart murmur   . HLD (hyperlipidemia)   . HTN (hypertension)   . Sleep apnea      Patient Active Problem List   Diagnosis Date Noted  . Anxiety 05/19/2016  . Insomnia 05/19/2016  . Pure hypercholesterolemia 05/19/2016  . Personal history of disease 03/11/2016  . Status post hip hemiarthroplasty 10/10/2015  . Hypoxia 09/26/2015  . Hypoxemia 09/26/2015  . Fracture of femoral neck, right ([place]) 09/23/2015  . Type 2 diabetes mellitus ([place]) 09/23/2015  . HTN (hypertension) 09/23/2015  . HLD (hyperlipidemia) 09/23/2015  . GERD (gastroesophageal reflux disease) 09/23/2015  . Depression 09/23/2015  . Hypothyroidism 09/23/2015  . DDD (degenerative disc disease), lumbar 08/26/2015  . Lumbar stenosis with neurogenic claudication 08/26/2015  .  Nocturia 04/29/2015  . Urinary frequency 04/29/2015  . Atrophic vaginitis 04/29/2015  . Mixed incontinence 06/28/2014  . Lumbar radiculopathy 02/08/2013     Past Surgical History:  Procedure Laterality Date  . ABDOMINAL HYSTERECTOMY    . CYSTOSCOPY    . HIP ARTHROPLASTY Right 09/24/2015   Procedure: ARTHROPLASTY BIPOLAR HIP (HEMIARTHROPLASTY);  Surgeon: Corky Mull, MD;  Location: [facility] ORS;  Service: Orthopedics;  Laterality: Right;  . KNEE ARTHROSCOPY W/ AUTOGENOUS CARTILAGE IMPLANTATION (ACI) PROCEDURE    . VARICOSE VEIN SURGERY       Prior to Admission medications   Medication Sig Start Date End Date Taking? Authorizing Provider  amLODipine (NORVASC) 10 MG tablet Take 10 mg by mouth daily.    Yes [provider]  aspirin 81 MG tablet Take 81 mg by mouth daily.    Yes [provider]  busPIRone (BUSPAR) 10 MG tablet Take 10 mg by mouth daily. 11/25/16  Yes [provider]  conjugated estrogens (PREMARIN) vaginal cream Place 1 Applicatorful vaginally daily. 05/19/16  Yes McGowan, Larene Beach A, PA-C  furosemide (LASIX) 20 MG tablet Take 20 mg by mouth daily.    Yes [provider]  Glycerin-Hypromellose-PEG 400 ([employer] DRY EYE RELIEF) 0.2-0.2-1 % SOLN Apply 1 drop to eye daily.   Yes [provider]  hydrochlorothiazide (HYDRODIURIL) 12.5 MG tablet Take 12.5 mg by mouth daily.    Yes [provider]  levothyroxine (SYNTHROID, LEVOTHROID) 75 MCG tablet Take 75 mcg by mouth daily.    Yes [provider]  losartan (COZAAR) 100  MG tablet Take 100 mg by mouth daily.    Yes [provider]  lovastatin (MEVACOR) 40 MG tablet Take 40 mg by mouth at bedtime.    Yes [provider]  metFORMIN (GLUCOPHAGE-XR) 500 MG 24 hr tablet Take 500 mg by mouth daily.    Yes [provider]  naproxen sodium (ANAPROX) 220 MG tablet Take 220 mg by mouth 2 (two) times daily with a meal.   Yes [provider]  omeprazole  (PRILOSEC) 40 MG capsule Take 40 mg by mouth daily.    Yes [provider]  PARoxetine (PAXIL) 40 MG tablet Take 40 mg by mouth at bedtime. 09/28/16  Yes [provider]  vitamin B-12 (CYANOCOBALAMIN) 1000 MCG tablet Take 1,000 mcg by mouth daily.   Yes [provider]  vitamin E 400 UNIT capsule Take 400 Units by mouth daily.    Yes [provider]  oxyCODONE (OXY IR/ROXICODONE) 5 MG immediate release tablet Take 1-2 tablets (5-10 mg total) by mouth every 3 (three) hours as needed for breakthrough pain. 09/26/15   Henreitta Leber, MD  solifenacin (VESICARE) 10 MG tablet Take 1 tablet (10 mg total) by mouth daily. Patient not taking: Reported on 01/06/2017 05/19/16   Zara Council A, PA-C     Allergies Cephalexin; Nitrofurantoin; Other; Sulfa antibiotics; and Atorvastatin   Family History  Problem Relation Age of Onset  . Kidney disease Brother        also nephew  . Prostate cancer Neg Hx   . Bladder Cancer Neg Hx   . Breast cancer Neg Hx     Social History Social History  Substance Use Topics  . Smoking status: Never Smoker  . Smokeless tobacco: Never Used  . Alcohol use No    Review of Systems  Constitutional:   No fever or chills.  ENT:   No sore throat. No rhinorrhea. Cardiovascular:   No chest pain or syncope. Respiratory:   Chronic shortness of breath without cough. Gastrointestinal:   Negative for abdominal pain, vomiting and diarrhea.  Musculoskeletal:   Positive for bilateral knee pain All other systems reviewed and are negative except as documented above in ROS and HPI.  ____________________________________________   PHYSICAL EXAM:  VITAL SIGNS: ED Triage Vitals  Enc Vitals Group     BP 01/06/17 0948 (!) 220/89     Pulse Rate 01/06/17 0948 64     Resp 01/06/17 0948 18     Temp 01/06/17 0948 97.9 F (36.6 C)     Temp Source 01/06/17 0948 Oral     SpO2 01/06/17 0948 98 %     Weight 01/06/17 0952 192 lb 14.4 oz (87.5 kg)      Height 01/06/17 0952 5\' 6"  (1.676 m)     Head Circumference --      Peak Flow --      Pain Score 01/06/17 0948 6     Pain Loc --      Pain Edu? --      Excl. in [place]? --     Vital signs reviewed, nursing assessments reviewed.   Constitutional:   Alert and oriented. Well appearing and in no distress. Eyes:   No scleral icterus. No conjunctival pallor. PERRL. EOMI.  No nystagmus. ENT   Head:   Normocephalic and atraumatic.   Nose:   No congestion/rhinnorhea.    Mouth/Throat:   MMM, no pharyngeal erythema. No peritonsillar mass.    Neck:   No meningismus. Full ROM  Hematological/Lymphatic/Immunilogical:   No cervical lymphadenopathy. Cardiovascular:   RRR. Symmetric bilateral radial and DP pulses.  No murmurs.  Respiratory:   Normal respiratory effort without tachypnea/retractions. Breath sounds are clear and equal bilaterally. No wheezes/rales/rhonchi. Gastrointestinal:   Soft and nontender. Non distended. There is no CVA tenderness.  No rebound, rigidity, or guarding. Genitourinary:   deferred Musculoskeletal:   Normal range of motion in all extremities. No joint effusions. Bilateral knee tenderness at the joint lines without focal bony tenderness. Bilateral pitting edema, 1+ on the right, 2+ on the left. No calf tenderness palpable cords. Negative Homans sign. No inflammatory changes. Neurologic:   Normal speech and language.  CN 2-10 normal. Motor grossly intact. No gross focal neurologic deficits are appreciated.  Skin:    Skin is warm, dry and intact. Small area of ecchymosis on the medial right knee, from a fall 1 week ago according to the patient.  ____________________________________________    LABS (pertinent positives/negatives) (all labs ordered are listed, but only abnormal results are displayed) Labs Reviewed  BASIC METABOLIC PANEL - Abnormal; Notable for the following:       Result Value   Chloride 99 (*)    CO2 33 (*)    Glucose, Bld 138 (*)     All other components within normal limits  CBC WITH DIFFERENTIAL/PLATELET - Abnormal; Notable for the following:    RDW 15.6 (*)    All other components within normal limits  URINALYSIS, COMPLETE (UACMP) WITH MICROSCOPIC - Abnormal; Notable for the following:    Color, Urine COLORLESS (*)    APPearance CLEAR (*)    Specific Gravity, Urine 1.004 (*)    All other components within normal limits  URINE CULTURE  TROPONIN I   ____________________________________________   EKG  Interpreted by me  Date: 01/06/2017  Rate: 60  Rhythm: normal sinus rhythm  QRS Axis: normal  Intervals: normal  ST/T Wave abnormalities: normal  Conduction Disutrbances: none  Narrative Interpretation: unremarkable      ____________________________________________    RADIOLOGY  Dg Chest 2 View  Result Date: 01/06/2017 CLINICAL DATA:  Status post fall today low with bilateral knee pain. The patient also reports dyspnea. No known cardiopulmonary abnormality. Patient has a history of hypertension and diabetes. EXAM: CHEST  2 VIEW COMPARISON:  Chest x-ray and chest CT scan of September 26, 2015 FINDINGS: The lungs are well-expanded. Minimal linear density just above the left cardiac apex is stable. There is no alveolar infiltrate. There is no pleural effusion. The heart and pulmonary vascularity are normal. There is calcification in the wall of the aortic arch. The bony thorax exhibits no acute abnormality. IMPRESSION: Stable scarring in the lingula. Mild chronic bronchitic changes. No pneumonia, CHF, nor other acute cardiopulmonary abnormality. Thoracic aortic atherosclerosis. Electronically Signed   By: David  Martinique M.D.   On: 01/06/2017 11:43   US Venous Img Lower Unilateral Left  Result Date: 01/06/2017 CLINICAL DATA:  [place] recently. Bilateral knee pain and swelling. Calf pain and swelling left more than right over the last day. EXAM: Left LOWER EXTREMITY VENOUS DOPPLER ULTRASOUND TECHNIQUE: Gray-scale  sonography with graded compression, as well as color Doppler and duplex ultrasound were performed to evaluate the lower extremity deep venous systems from the level of the common femoral vein and including the common femoral, femoral, profunda femoral, popliteal and calf veins including the posterior tibial, peroneal and gastrocnemius veins when visible. The superficial great saphenous vein was also interrogated. Spectral Doppler was utilized to evaluate flow at  rest and with distal augmentation maneuvers in the common femoral, femoral and popliteal veins. COMPARISON:  07/28/2016 FINDINGS: Contralateral Common Femoral Vein: Respiratory phasicity is normal and symmetric with the symptomatic side. No evidence of thrombus. Normal compressibility. Common Femoral Vein: No evidence of thrombus. Normal compressibility, respiratory phasicity and response to augmentation. Saphenofemoral Junction: No evidence of thrombus. Normal compressibility and flow on color Doppler imaging. Profunda Femoral Vein: No evidence of thrombus. Normal compressibility and flow on color Doppler imaging. Femoral Vein: No evidence of thrombus. Normal compressibility, respiratory phasicity and response to augmentation. Popliteal Vein: No evidence of thrombus. Normal compressibility, respiratory phasicity and response to augmentation. Calf Veins: No evidence of thrombus. Normal compressibility and flow on color Doppler imaging. Superficial Great Saphenous Vein: No evidence of thrombus. Normal compressibility and flow on color Doppler imaging. Venous Reflux:  None. Other Findings: Baker cysts again demonstrated in the left popliteal fossa, measurements approximately 3.1 x 2.8 x 1.2 cm today. IMPRESSION: No evidence of deep venous thrombosis in the left lower extremity. Re- demonstration of a Baker cyst in the popliteal fossa. Electronically Signed   By: Nelson Chimes M.D.   On: 01/06/2017 11:27   Dg Knee Complete 4 Views Left  Result Date:  01/06/2017 CLINICAL DATA:  Status post fall today with bilateral generalized knee pain. EXAM: LEFT KNEE - COMPLETE 4+ VIEW COMPARISON:  None in PACs FINDINGS: The bones are subjectively adequately mineralized. There is mild narrowing of the medial and lateral joint spaces. Small spurs arise from the periphery of the articular margins of the medial tibial plateau and medial femoral condyle. There are subarticular lucencies in the lateral femoral condyle and lateral tibial plateau without evidence of a articular surface collapse or other fracture. There are spurs arising from the articular margins of the patella. A small suprapatellar effusion is suspected. IMPRESSION: There are degenerative changes involving all 3 joint compartments. No acute fracture or dislocation is observed. Subarticular lucencies in the lateral femoral condyle and lateral tibial plateau are likely degenerative in nature. MRI of the left knee is recommended. Electronically Signed   By: David  Martinique M.D.   On: 01/06/2017 11:46   Dg Knee Complete 4 Views Right  Result Date: 01/06/2017 CLINICAL DATA:  Status post fall. Bilateral knee pain and swelling greatest on the left. EXAM: RIGHT KNEE - COMPLETE 4+ VIEW COMPARISON:  None in PACs FINDINGS: The patient has undergone previous right knee joint prosthesis placement. Radiographic positioning of the prosthetic components is good. The interface with the native bone appears normal. No native bone fracture is observed. IMPRESSION: There is no acute or significant chronic abnormality of the prosthetic right knee joint nor of the native bone. Electronically Signed   By: David  Martinique M.D.   On: 01/06/2017 11:47    ____________________________________________   PROCEDURES Procedures  ____________________________________________   INITIAL IMPRESSION / ASSESSMENT AND PLAN / ED COURSE  Pertinent labs & imaging results that were available during my care of the patient were reviewed by me and  considered in my medical decision making (see chart for details). Patient presents with bilateral leg swelling, left greater than right, subacute over the last 2 weeks. Also has chronic shortness of breath for the last 2 years. No acute complaints, patient evaluated by PCP yesterday who recommended compression stockings per the patient. Does have some falls which she reports as mechanical without any worrisome preceding symptoms. Check labs urinalysis x-ray both knees, ultrasound left lower extremity..  Clinical Course as of Jan 07 1199  Thu Jan 06, 2017  1136 [place] = L baker cyst as cause of sx. F/u xr, likely DC home.   [PS]    Clinical Course User Index [PS] Carrie Mew, MD    ----------------------------------------- 12:00 PM on 01/06/2017 -----------------------------------------  X-rays unremarkable. We'll discharge home, follow up with primary care.   ____________________________________________   FINAL CLINICAL IMPRESSION(S) / ED DIAGNOSES  Final diagnoses:  Peripheral edema  Baker cyst, left  Osteoarthritis of left knee, unspecified osteoarthritis type      New Prescriptions   No medications on file     Portions of this note were generated with dragon dictation software. Dictation errors may occur despite best attempts at proofreading.    Carrie Mew, MD 01/06/17 1200

## 2017-01-06 NOTE — ED Notes (Signed)
Patient able to ambulate to restroom with 1 assist. Patient able to move freely in bed.

## 2017-01-07 LAB — URINE CULTURE: Culture: <10000 — AB

## 2017-03-09 ENCOUNTER — Telehealth: Payer: Self-pay | Admitting: Urology

## 2017-03-09 ENCOUNTER — Ambulatory Visit: Payer: Medicare Other

## 2017-03-09 VITALS — BP 194/78 | HR 71 | Temp 98.2°F | Ht 66.0 in | Wt 191.6 lb

## 2017-03-09 DIAGNOSIS — R350 Frequency of micturition: Secondary | ICD-10-CM

## 2017-03-09 LAB — URINALYSIS, COMPLETE
Bilirubin, UA: NEGATIVE
GLUCOSE, UA: NEGATIVE
Ketones, UA: NEGATIVE
NITRITE UA: NEGATIVE
SPEC GRAV UA: 1.015 (ref 1.005–1.030)
Urobilinogen, Ur: 4 mg/dL — ABNORMAL HIGH (ref 0.2–1.0)
pH, UA: 8 — ABNORMAL HIGH (ref 5.0–7.5)

## 2017-03-09 LAB — MICROSCOPIC EXAMINATION

## 2017-03-09 NOTE — Telephone Encounter (Signed)
Pt called office stating she thinks she has a kidney infection, complains of pain shooting from her bladder to her right kidney with nausea, states she "just hurts" I have added her to the nurse schedule this morning. Please advise. Thanks.

## 2017-03-09 NOTE — Telephone Encounter (Signed)
Noted. Thanks.

## 2017-03-09 NOTE — Progress Notes (Signed)
Patient in office c/o UTI sx(s):   Pain 8/10 radiating from RLQ to R lower back. Urinary frequency Hard to postpone urination Leakage of urine Back/flank pain Lower abdominal pain Nausea Urinary Urgency  Has not been on antibiotics in the last 30 days. Not on suppressive antibiotics. Has not had urological surgery in the last 30 days.   Vitals:   03/09/17 1012  BP: (!) 194/78  Pulse: 71  Temp: 98.2 F (36.8 C)  TempSrc: Oral  Weight: 191 lb 9.6 oz (86.9 kg)  Height: 5\' 6"  (1.676 m)   Pt provided urine sample, advised pt of u/a and ucx turn-around times, and informed will be contacted when results reviewed by provider. Pt verbalized understanding.

## 2017-03-11 LAB — URINE CULTURE

## 2017-03-14 ENCOUNTER — Telehealth: Payer: Self-pay

## 2017-03-14 NOTE — Telephone Encounter (Signed)
Spoke to patient. Gave results and instructions. Pt states her sx(s) are better, but would like samples of med to help with using the bathroom at night. Advised pt she would need to schedule an earlier appt w/ Larene Beach. Pt stated she would just keep upcoming appt in Sept.

## 2017-03-14 NOTE — Telephone Encounter (Signed)
-----   Message from Nori Riis, PA-C sent at 03/11/2017  4:41 PM EDT ----- Please let Mrs. Varone know that her urine culture was negative. If she is still having symptoms, she needs to be seen.

## 2017-03-30 DIAGNOSIS — E1142 Type 2 diabetes mellitus with diabetic polyneuropathy: Secondary | ICD-10-CM | POA: Insufficient documentation

## 2017-05-18 NOTE — Progress Notes (Signed)
05/19/2017 10:48 AM   Mary [name] October 23, 1934 [id number]  Referring provider: Dion Body, MD [facility] [place], [place] [address]  Chief Complaint  Patient presents with  . Urinary Frequency    1 year follow up    HPI: Patient is a 82 year old Caucasian female who presented today for a one year follow up for nocturia, frequency and vaginal atrophy.  Nocturia Patient is getting up two times a night to urinate.  She admits that she has not been sleeping well since her husband's passing in June.    Frequency Patient is not finding the Vesicare helpful and it is too costly to fill the script.  Her PVR today is 0 mL.  Vaginal atrophy Patient is using the vaginal estrogen cream on occasion.    Today, she is experiencing urgency x 4-7, frequency x 4-7,  is restricting fluids to avoid visits to the restroom, not engaging in toilet mapping, incontinence x 0-3 and nocturia x 4-7.   Her PVR is 0 mL.   She is finding the Vesicare cost prohibitive.  Her BP is 215/92.  She has not had gross hematuria, dysuria and suprapubic pain.  She has not had fevers, chills, nausea or vomiting.    She states her blood pressure has been running high all week. She is also experiencing a slight headache at this time.  She is not having visual changes, dizziness, chest pain or syncopal episodes.    PMH: Past Medical History:  Diagnosis Date  . Acid reflux   . Arthritis   . Asthma   . Depression   . Diabetes ([place])   . Heart murmur   . HLD (hyperlipidemia)   . HTN (hypertension)   . Sleep apnea     Surgical History: Past Surgical History:  Procedure Laterality Date  . ABDOMINAL HYSTERECTOMY    . CYSTOSCOPY    . HIP ARTHROPLASTY Right 09/24/2015   Procedure: ARTHROPLASTY BIPOLAR HIP (HEMIARTHROPLASTY);  Surgeon: Corky Mull, MD;  Location: [facility] ORS;  Service: Orthopedics;  Laterality: Right;  . KNEE ARTHROSCOPY W/ AUTOGENOUS CARTILAGE IMPLANTATION (ACI)  PROCEDURE    . VARICOSE VEIN SURGERY      Home Medications:  Allergies as of 05/19/2017      Reactions   Cephalexin Hives   Nitrofurantoin    Other reaction(s): Other (See Comments) Other Reaction: measles-like lesions   Other    Other reaction(s): Other (See Comments) Uncoded Allergy. Allergen: microdantin, Other Reaction: Measle-like Lesions   Sulfa Antibiotics Hives   Atorvastatin Other (See Comments)   Muscle spasms      Medication List       Accurate as of 05/19/17 10:48 AM. Always use your most recent med list.          amLODipine 10 MG tablet Commonly known as:  NORVASC Take 10 mg by mouth daily.   aspirin 81 MG tablet Take 81 mg by mouth daily.   busPIRone 10 MG tablet Commonly known as:  BUSPAR Take 10 mg by mouth daily.   conjugated estrogens vaginal cream Commonly known as:  PREMARIN Place 1 Applicatorful vaginally daily.   [facility] DRY EYE RELIEF 0.2-0.2-1 % Soln Generic drug:  Glycerin-Hypromellose-PEG 400 Apply 1 drop to eye daily.   furosemide 20 MG tablet Commonly known as:  LASIX Take 20 mg by mouth daily.   hydrochlorothiazide 12.5 MG tablet Commonly known as:  HYDRODIURIL Take 12.5 mg by mouth daily.   levothyroxine 75  MCG tablet Commonly known as:  SYNTHROID, LEVOTHROID Take 75 mcg by mouth daily.   losartan 100 MG tablet Commonly known as:  COZAAR Take 100 mg by mouth daily.   lovastatin 40 MG tablet Commonly known as:  MEVACOR Take 40 mg by mouth at bedtime.   metFORMIN 500 MG 24 hr tablet Commonly known as:  GLUCOPHAGE-XR Take 500 mg by mouth daily.   naproxen sodium 220 MG tablet Commonly known as:  ANAPROX Take 220 mg by mouth 2 (two) times daily with a meal.   omeprazole 40 MG capsule Commonly known as:  PRILOSEC Take 40 mg by mouth daily.   oxyCODONE 5 MG immediate release tablet Commonly known as:  Oxy IR/ROXICODONE Take 1-2 tablets (5-10 mg total) by mouth every 3 (three) hours as needed for breakthrough pain.     PARoxetine 40 MG tablet Commonly known as:  PAXIL Take 40 mg by mouth at bedtime.   solifenacin 10 MG tablet Commonly known as:  VESICARE Take 1 tablet (10 mg total) by mouth daily.   vitamin B-12 1000 MCG tablet Commonly known as:  CYANOCOBALAMIN Take 1,000 mcg by mouth daily.   vitamin E 400 UNIT capsule Take 400 Units by mouth daily.            Discharge Care Instructions        Start     Ordered   05/19/17 0000  BLADDER SCAN AMB NON-IMAGING     05/19/17 0954      Allergies:  Allergies  Allergen Reactions  . Cephalexin Hives  . Nitrofurantoin     Other reaction(s): Other (See Comments) Other Reaction: measles-like lesions  . Other     Other reaction(s): Other (See Comments) Uncoded Allergy. Allergen: microdantin, Other Reaction: Measle-like Lesions  . Sulfa Antibiotics Hives  . Atorvastatin Other (See Comments)    Muscle spasms    Family History: Family History  Problem Relation Age of Onset  . Kidney disease Brother        also nephew  . Prostate cancer Neg Hx   . Bladder Cancer Neg Hx   . Breast cancer Neg Hx   . Kidney cancer Neg Hx     Social History:  reports that she has never smoked. She has never used smokeless tobacco. She reports that she does not drink alcohol or use drugs.  ROS: UROLOGY Frequent Urination?: Yes Hard to postpone urination?: No Burning/pain with urination?: No Get up at night to urinate?: Yes Leakage of urine?: No Urine stream starts and stops?: No Trouble starting stream?: No Do you have to strain to urinate?: No Blood in urine?: No Urinary tract infection?: No Sexually transmitted disease?: No Injury to kidneys or bladder?: No Painful intercourse?: No Weak stream?: No Currently pregnant?: No Vaginal bleeding?: No Last menstrual period?: n  Gastrointestinal Nausea?: No Vomiting?: No Indigestion/heartburn?: No Diarrhea?: No Constipation?: No  Constitutional Fever: No Night sweats?: No Weight loss?:  No Fatigue?: No  Skin Skin rash/lesions?: No Itching?: No  Eyes Blurred vision?: No Double vision?: No  Ears/Nose/Throat Sore throat?: No Sinus problems?: No  Hematologic/Lymphatic Swollen glands?: No Easy bruising?: No  Cardiovascular Leg swelling?: Yes Chest pain?: No  Respiratory Cough?: No Shortness of breath?: No  Endocrine Excessive thirst?: No  Musculoskeletal Back pain?: Yes Joint pain?: Yes  Neurological Headaches?: Yes Dizziness?: No  Psychologic Depression?: No Anxiety?: Yes  Physical Exam: BP (!) 215/92   Pulse 72   Ht 5\' 6"  (1.676 m)   Wt 190  lb 11.2 oz (86.5 kg)   BMI 30.78 kg/m   Constitutional: Well nourished. Alert and oriented, No acute distress. HEENT: [place] AT, moist mucus membranes. Trachea midline, no masses. Cardiovascular: No clubbing, cyanosis, or edema. Respiratory: Normal respiratory effort, no increased work of breathing. Skin: No rashes, bruises or suspicious lesions. Lymph: No cervical or inguinal adenopathy. Neurologic: Grossly intact, no focal deficits, moving all 4 extremities. Psychiatric: Normal mood and affect.  Laboratory Data:  Lab Results  Component Value Date   WBC 5.5 01/06/2017   HGB 12.1 01/06/2017   HCT 37.7 01/06/2017   MCV 84.2 01/06/2017   PLT 197 01/06/2017    Lab Results  Component Value Date   CREATININE 0.64 01/06/2017   I have reviewed the labs  Pertinent Imaging: Results for JOHNEISHA, BROADEN (MRN [id number]) as of 05/19/2017 10:48  Ref. Range 05/19/2017 10:05  Scan Result Unknown 0     Assessment & Plan:    1. HTN  - Dr. Raylene Miyamoto office is contacted today and she is instructed to be seen in his office at 1:30 with Grayland Ormond, PA-C and go to the ED if symptoms worsen  2. Frequency  - did not find Vesicare very effective and it is cost prohibitive  - offered behavioral therapies, bladder training, bladder control strategies and pelvic floor muscle training - patient defers  stating "if it isn't a pill I can't do it"  - BLADDER SCAN AMB NON-IMAGING  - I did give her the hand out on PTNS - she will call back with her thoughts after she reads through it  3. Nocturia  - Patient is not sleeping well due to her husband's passing  - see above  4. Atrophic vaginitis  - Patient using vaginal cream sparingly      Return for patient to call.  Zara Council, [facility] [address] [place], [place] [address] [phone]

## 2017-05-19 ENCOUNTER — Emergency Department: Payer: Medicare Other

## 2017-05-19 ENCOUNTER — Observation Stay
Admission: EM | Admit: 2017-05-19 | Discharge: 2017-05-21 | Disposition: A | Discharge status: Home Health | Payer: Medicare Other | Attending: Internal Medicine | Admitting: Internal Medicine

## 2017-05-19 ENCOUNTER — Ambulatory Visit: Payer: Medicare Other | Admitting: Urology

## 2017-05-19 ENCOUNTER — Encounter: Payer: Self-pay | Admitting: Urology

## 2017-05-19 ENCOUNTER — Encounter: Payer: Self-pay | Admitting: Emergency Medicine

## 2017-05-19 VITALS — BP 215/92 | HR 72 | Ht 66.0 in | Wt 190.7 lb

## 2017-05-19 DIAGNOSIS — F419 Anxiety disorder, unspecified: Secondary | ICD-10-CM | POA: Diagnosis not present

## 2017-05-19 DIAGNOSIS — R011 Cardiac murmur, unspecified: Secondary | ICD-10-CM | POA: Insufficient documentation

## 2017-05-19 DIAGNOSIS — G4700 Insomnia, unspecified: Secondary | ICD-10-CM | POA: Diagnosis not present

## 2017-05-19 DIAGNOSIS — K219 Gastro-esophageal reflux disease without esophagitis: Secondary | ICD-10-CM | POA: Diagnosis not present

## 2017-05-19 DIAGNOSIS — R001 Bradycardia, unspecified: Secondary | ICD-10-CM | POA: Diagnosis not present

## 2017-05-19 DIAGNOSIS — R350 Frequency of micturition: Secondary | ICD-10-CM | POA: Diagnosis not present

## 2017-05-19 DIAGNOSIS — Z96641 Presence of right artificial hip joint: Secondary | ICD-10-CM | POA: Insufficient documentation

## 2017-05-19 DIAGNOSIS — R519 Headache, unspecified: Secondary | ICD-10-CM

## 2017-05-19 DIAGNOSIS — J45909 Unspecified asthma, uncomplicated: Secondary | ICD-10-CM | POA: Insufficient documentation

## 2017-05-19 DIAGNOSIS — H04123 Dry eye syndrome of bilateral lacrimal glands: Secondary | ICD-10-CM | POA: Diagnosis not present

## 2017-05-19 DIAGNOSIS — M1990 Unspecified osteoarthritis, unspecified site: Secondary | ICD-10-CM | POA: Diagnosis not present

## 2017-05-19 DIAGNOSIS — Z888 Allergy status to other drugs, medicaments and biological substances status: Secondary | ICD-10-CM | POA: Diagnosis not present

## 2017-05-19 DIAGNOSIS — E039 Hypothyroidism, unspecified: Secondary | ICD-10-CM | POA: Insufficient documentation

## 2017-05-19 DIAGNOSIS — F329 Major depressive disorder, single episode, unspecified: Secondary | ICD-10-CM | POA: Diagnosis not present

## 2017-05-19 DIAGNOSIS — E785 Hyperlipidemia, unspecified: Secondary | ICD-10-CM | POA: Diagnosis not present

## 2017-05-19 DIAGNOSIS — E7800 Pure hypercholesterolemia, unspecified: Secondary | ICD-10-CM | POA: Diagnosis not present

## 2017-05-19 DIAGNOSIS — Z882 Allergy status to sulfonamides status: Secondary | ICD-10-CM | POA: Insufficient documentation

## 2017-05-19 DIAGNOSIS — I10 Essential (primary) hypertension: Secondary | ICD-10-CM | POA: Diagnosis not present

## 2017-05-19 DIAGNOSIS — Z79899 Other long term (current) drug therapy: Secondary | ICD-10-CM | POA: Diagnosis not present

## 2017-05-19 DIAGNOSIS — N952 Postmenopausal atrophic vaginitis: Secondary | ICD-10-CM | POA: Diagnosis not present

## 2017-05-19 DIAGNOSIS — I160 Hypertensive urgency: Secondary | ICD-10-CM | POA: Diagnosis present

## 2017-05-19 DIAGNOSIS — R531 Weakness: Secondary | ICD-10-CM | POA: Diagnosis not present

## 2017-05-19 DIAGNOSIS — G4733 Obstructive sleep apnea (adult) (pediatric): Secondary | ICD-10-CM | POA: Insufficient documentation

## 2017-05-19 DIAGNOSIS — R351 Nocturia: Secondary | ICD-10-CM

## 2017-05-19 DIAGNOSIS — R51 Headache: Secondary | ICD-10-CM | POA: Diagnosis not present

## 2017-05-19 DIAGNOSIS — Z7982 Long term (current) use of aspirin: Secondary | ICD-10-CM | POA: Diagnosis not present

## 2017-05-19 DIAGNOSIS — E119 Type 2 diabetes mellitus without complications: Secondary | ICD-10-CM | POA: Insufficient documentation

## 2017-05-19 DIAGNOSIS — Z7984 Long term (current) use of oral hypoglycemic drugs: Secondary | ICD-10-CM | POA: Diagnosis not present

## 2017-05-19 LAB — COMPREHENSIVE METABOLIC PANEL
ALBUMIN: 3.9 g/dL (ref 3.5–5.0)
ALK PHOS: 82 U/L (ref 38–126)
ALT: 9 U/L — ABNORMAL LOW (ref 14–54)
ANION GAP: 10 (ref 5–15)
AST: 22 U/L (ref 15–41)
BILIRUBIN TOTAL: 0.2 mg/dL — AB (ref 0.3–1.2)
BUN: 13 mg/dL (ref 6–20)
CO2: 29 mmol/L (ref 22–32)
Calcium: 9.5 mg/dL (ref 8.9–10.3)
Chloride: 99 mmol/L — ABNORMAL LOW (ref 101–111)
Creatinine, Ser: 0.61 mg/dL (ref 0.44–1.00)
GFR calc Af Amer: >60 mL/min (ref >60)
GFR calc non Af Amer: >60 mL/min (ref >60)
GLUCOSE: 149 mg/dL — AB (ref 65–99)
POTASSIUM: 3.4 mmol/L — AB (ref 3.5–5.1)
SODIUM: 138 mmol/L (ref 135–145)
Total Protein: 7.6 g/dL (ref 6.5–8.1)

## 2017-05-19 LAB — CBC WITH DIFFERENTIAL/PLATELET
Basophils Absolute: 0 K/uL (ref 0–0.1)
Basophils Relative: 1 %
Eosinophils Absolute: 0.1 K/uL (ref 0–0.7)
Eosinophils Relative: 2 %
HCT: 35.5 % (ref 35.0–47.0)
Hemoglobin: 11.4 g/dL — ABNORMAL LOW (ref 12.0–16.0)
Lymphocytes Relative: 25 %
Lymphs Abs: 1.5 K/uL (ref 1.0–3.6)
MCH: 27.2 pg (ref 26.0–34.0)
MCHC: 32 g/dL (ref 32.0–36.0)
MCV: 85.1 fL (ref 80.0–100.0)
Monocytes Absolute: 0.5 K/uL (ref 0.2–0.9)
Monocytes Relative: 8 %
Neutro Abs: 3.9 K/uL (ref 1.4–6.5)
Neutrophils Relative %: 64 %
Platelets: 188 K/uL (ref 150–440)
RBC: 4.17 MIL/uL (ref 3.80–5.20)
RDW: 16.4 % — ABNORMAL HIGH (ref 11.5–14.5)
WBC: 6 K/uL (ref 3.6–11.0)

## 2017-05-19 LAB — TROPONIN I: Troponin I: <0.03 ng/mL

## 2017-05-19 LAB — BRAIN NATRIURETIC PEPTIDE: B Natriuretic Peptide: 145 pg/mL — ABNORMAL HIGH (ref 0.0–100.0)

## 2017-05-19 LAB — GLUCOSE, CAPILLARY: GLUCOSE-CAPILLARY: 135 mg/dL — AB (ref 65–99)

## 2017-05-19 LAB — BLADDER SCAN AMB NON-IMAGING: Scan Result: 0

## 2017-05-19 MED ORDER — ONDANSETRON HCL 4 MG/2ML IJ SOLN: 4.0000 mg | Freq: Four times a day (QID) | INTRAMUSCULAR | Status: DC | PRN

## 2017-05-19 MED ORDER — OXYCODONE-ACETAMINOPHEN 5-325 MG PO TABS
1.0000 | ORAL_TABLET | Freq: Once | ORAL | Status: AC
  Administered 2017-05-19: 1 via ORAL
  Filled 2017-05-19: qty 1

## 2017-05-19 MED ORDER — VITAMIN B-12 1000 MCG PO TABS
1000.0000 ug | ORAL_TABLET | Freq: Every day | ORAL | Status: DC
Start: 2017-05-20 — End: 2017-05-21
  Administered 2017-05-20 – 2017-05-21 (×2): 1000 ug via ORAL
  Filled 2017-05-19 (×2): qty 1

## 2017-05-19 MED ORDER — NAPROXEN 250 MG PO TABS
250.0000 mg | ORAL_TABLET | Freq: Two times a day (BID) | ORAL | Status: DC
  Administered 2017-05-20 – 2017-05-21 (×3): 250 mg via ORAL
  Filled 2017-05-19 (×3): qty 1

## 2017-05-19 MED ORDER — LEVOTHYROXINE SODIUM 25 MCG PO TABS
75.0000 ug | ORAL_TABLET | Freq: Every day | ORAL | Status: DC
  Administered 2017-05-20 – 2017-05-21 (×2): 75 ug via ORAL
  Filled 2017-05-19 (×2): qty 1

## 2017-05-19 MED ORDER — IBUPROFEN 600 MG PO TABS
600.0000 mg | ORAL_TABLET | Freq: Once | ORAL | Status: AC
  Administered 2017-05-19: 600 mg via ORAL
  Filled 2017-05-19: qty 1

## 2017-05-19 MED ORDER — BUSPIRONE HCL 10 MG PO TABS
10.0000 mg | ORAL_TABLET | Freq: Every day | ORAL | Status: DC
  Administered 2017-05-19 – 2017-05-21 (×3): 10 mg via ORAL
  Filled 2017-05-19 (×3): qty 1

## 2017-05-19 MED ORDER — VITAMIN E 180 MG (400 UNIT) PO CAPS
400.0000 [IU] | ORAL_CAPSULE | Freq: Every day | ORAL | Status: DC
  Administered 2017-05-20 – 2017-05-21 (×2): 400 [IU] via ORAL
  Filled 2017-05-19 (×2): qty 1

## 2017-05-19 MED ORDER — METFORMIN HCL ER 500 MG PO TB24
500.0000 mg | ORAL_TABLET | Freq: Every day | ORAL | Status: DC
  Administered 2017-05-20 – 2017-05-21 (×2): 500 mg via ORAL
  Filled 2017-05-19 (×2): qty 1

## 2017-05-19 MED ORDER — ENALAPRILAT 1.25 MG/ML IV SOLN
1.2500 mg | Freq: Once | INTRAVENOUS | Status: AC
  Administered 2017-05-19: 1.25 mg via INTRAVENOUS

## 2017-05-19 MED ORDER — HYDRALAZINE HCL 20 MG/ML IJ SOLN
INTRAMUSCULAR | Status: AC
  Administered 2017-05-19: 10 mg via INTRAVENOUS
  Filled 2017-05-19: qty 1

## 2017-05-19 MED ORDER — CLONIDINE HCL 0.1 MG PO TABS
0.1000 mg | ORAL_TABLET | Freq: Once | ORAL | Status: AC
  Administered 2017-05-19: 0.1 mg via ORAL
  Filled 2017-05-19: qty 1

## 2017-05-19 MED ORDER — PAROXETINE HCL 20 MG PO TABS
40.0000 mg | ORAL_TABLET | Freq: Every day | ORAL | Status: DC
  Administered 2017-05-19 – 2017-05-20 (×2): 40 mg via ORAL
  Filled 2017-05-19 (×2): qty 2

## 2017-05-19 MED ORDER — POLYVINYL ALCOHOL 1.4 % OP SOLN
1.0000 [drp] | Freq: Every day | OPHTHALMIC | Status: DC
  Administered 2017-05-19 – 2017-05-21 (×3): 1 [drp] via OPHTHALMIC
  Filled 2017-05-19: qty 15

## 2017-05-19 MED ORDER — ENALAPRILAT 1.25 MG/ML IV SOLN
INTRAVENOUS | Status: AC
  Filled 2017-05-19: qty 2

## 2017-05-19 MED ORDER — HYDROCHLOROTHIAZIDE 25 MG PO TABS: 12.5000 mg | ORAL_TABLET | Freq: Every day | ORAL | Status: DC

## 2017-05-19 MED ORDER — ACETAMINOPHEN 650 MG RE SUPP: 650.0000 mg | Freq: Four times a day (QID) | RECTAL | Status: DC | PRN

## 2017-05-19 MED ORDER — LOSARTAN POTASSIUM 50 MG PO TABS
100.0000 mg | ORAL_TABLET | Freq: Every day | ORAL | Status: DC
  Administered 2017-05-20 – 2017-05-21 (×2): 100 mg via ORAL
  Filled 2017-05-19 (×2): qty 2

## 2017-05-19 MED ORDER — ENOXAPARIN SODIUM 40 MG/0.4ML ~~LOC~~ SOLN
40.0000 mg | SUBCUTANEOUS | Status: DC
  Administered 2017-05-20: 40 mg via SUBCUTANEOUS
  Filled 2017-05-19 (×2): qty 0.4

## 2017-05-19 MED ORDER — NICARDIPINE HCL IN NACL 20-0.86 MG/200ML-% IV SOLN
3.0000 mg/h | Freq: Once | INTRAVENOUS | Status: DC
  Filled 2017-05-19: qty 200

## 2017-05-19 MED ORDER — AMLODIPINE BESYLATE 10 MG PO TABS
10.0000 mg | ORAL_TABLET | Freq: Every day | ORAL | Status: DC
  Administered 2017-05-19 – 2017-05-21 (×3): 10 mg via ORAL
  Filled 2017-05-19 (×3): qty 1

## 2017-05-19 MED ORDER — AMLODIPINE BESYLATE 5 MG PO TABS
10.0000 mg | ORAL_TABLET | Freq: Once | ORAL | Status: AC
  Administered 2017-05-19: 10 mg via ORAL
  Filled 2017-05-19: qty 2

## 2017-05-19 MED ORDER — ACETAMINOPHEN 325 MG PO TABS
650.0000 mg | ORAL_TABLET | Freq: Four times a day (QID) | ORAL | Status: DC | PRN
  Administered 2017-05-19: 650 mg via ORAL
  Filled 2017-05-19: qty 2

## 2017-05-19 MED ORDER — HYDRALAZINE HCL 20 MG/ML IJ SOLN
10.0000 mg | Freq: Four times a day (QID) | INTRAMUSCULAR | Status: DC | PRN
  Administered 2017-05-19 – 2017-05-20 (×2): 10 mg via INTRAVENOUS
  Filled 2017-05-19 (×3): qty 1

## 2017-05-19 MED ORDER — ONDANSETRON HCL 4 MG PO TABS: 4.0000 mg | ORAL_TABLET | Freq: Four times a day (QID) | ORAL | Status: DC | PRN

## 2017-05-19 MED ORDER — PANTOPRAZOLE SODIUM 40 MG PO TBEC
40.0000 mg | DELAYED_RELEASE_TABLET | Freq: Every day | ORAL | Status: DC
  Administered 2017-05-20 – 2017-05-21 (×2): 40 mg via ORAL
  Filled 2017-05-19 (×2): qty 1

## 2017-05-19 MED ORDER — ASPIRIN EC 81 MG PO TBEC
81.0000 mg | DELAYED_RELEASE_TABLET | Freq: Every day | ORAL | Status: DC
  Administered 2017-05-20 – 2017-05-21 (×2): 81 mg via ORAL
  Filled 2017-05-19 (×2): qty 1

## 2017-05-19 NOTE — ED Notes (Signed)
Patient taken to CT scan.

## 2017-05-19 NOTE — ED Provider Notes (Signed)
[facility] Emergency Department Provider Note  ____________________________________________   First MD Initiated Contact with Patient 05/19/17 1558     (approximate)  I have reviewed the triage vital signs and the nursing notes.   HISTORY  Chief Complaint Hypertension    HPI CELITA ARON is a 82 y.o. female who is sent to the emergency department by her primary care physician for elevated blood pressure. The patient was in her usual state of health and this morning went to an appointment to see her urologist for scheduled routine visit. In the office her blood pressure was noted to be 220s into 30s so they made an appointment for her to see her primary care physician this afternoon. She got to the primary's office her blood pressure remained elevated in the advised her to come to the emergency department. She said she feels generally well and roughly exactly how she normally feels. She did wake up this morning with gradual onset not maximal onset mild to moderate throbbing bifrontal headache that is similar to her usual daily headaches. She denies double vision or blurred vision. She denies chest pain, shortness of breath, abdominal pain, nausea, vomiting, neck pain, weakness, numbness. She said her blood pressure is usually very poorly controlled and is very frequently significantly elevated. She takes 10 mg of Norvasc, 100 mg of Cozaar, and 12.5 mg of hydrochlorothiazide daily. She took her hydrochlorothiazide and Cozaar this morning but is yet to take her Norvasc.   Past Medical History:  Diagnosis Date  . Acid reflux   . Arthritis   . Asthma   . Depression   . Diabetes ([employer])   . Heart murmur   . HLD (hyperlipidemia)   . HTN (hypertension)   . Sleep apnea     Patient Active Problem List   Diagnosis Date Noted  . Hypertensive urgency 05/19/2017  . Anxiety 05/19/2016  . Insomnia 05/19/2016  . Pure hypercholesterolemia 05/19/2016  . Personal  history of disease 03/11/2016  . Status post hip hemiarthroplasty 10/10/2015  . Hypoxia 09/26/2015  . Hypoxemia 09/26/2015  . Fracture of femoral neck, right ([place]) 09/23/2015  . Type 2 diabetes mellitus ([place]) 09/23/2015  . HTN (hypertension) 09/23/2015  . HLD (hyperlipidemia) 09/23/2015  . GERD (gastroesophageal reflux disease) 09/23/2015  . Depression 09/23/2015  . Hypothyroidism 09/23/2015  . DDD (degenerative disc disease), lumbar 08/26/2015  . Lumbar stenosis with neurogenic claudication 08/26/2015  . Nocturia 04/29/2015  . Urinary frequency 04/29/2015  . Atrophic vaginitis 04/29/2015  . Mixed incontinence 06/28/2014  . Lumbar radiculopathy 02/08/2013    Past Surgical History:  Procedure Laterality Date  . ABDOMINAL HYSTERECTOMY    . CYSTOSCOPY    . HIP ARTHROPLASTY Right 09/24/2015   Procedure: ARTHROPLASTY BIPOLAR HIP (HEMIARTHROPLASTY);  Surgeon: Corky Mull, MD;  Location: [facility] ORS;  Service: Orthopedics;  Laterality: Right;  . KNEE ARTHROSCOPY W/ AUTOGENOUS CARTILAGE IMPLANTATION (ACI) PROCEDURE    . VARICOSE VEIN SURGERY      Prior to Admission medications   Medication Sig Start Date End Date Taking? Authorizing Provider  amLODipine (NORVASC) 10 MG tablet Take 10 mg by mouth daily.    Yes [provider]  aspirin 81 MG tablet Take 81 mg by mouth daily.    Yes [provider]  busPIRone (BUSPAR) 10 MG tablet Take 10 mg by mouth daily. 11/25/16  Yes [provider]  Glycerin-Hypromellose-PEG 400 ([facility] DRY EYE RELIEF) 0.2-0.2-1 % SOLN Apply 1 drop to eye daily.   Yes  [provider]  hydrochlorothiazide (HYDRODIURIL) 12.5 MG tablet Take 12.5 mg by mouth daily.    Yes [provider]  levothyroxine (SYNTHROID, LEVOTHROID) 75 MCG tablet Take 75 mcg by mouth daily.    Yes [provider]  losartan (COZAAR) 100 MG tablet Take 100 mg by mouth daily.    Yes [provider]  metFORMIN (GLUCOPHAGE-XR) 500 MG 24 hr tablet  Take 500 mg by mouth daily.    Yes [provider]  naproxen sodium (ANAPROX) 220 MG tablet Take 220 mg by mouth 2 (two) times daily with a meal.   Yes [provider]  omeprazole (PRILOSEC) 40 MG capsule Take 40 mg by mouth daily.    Yes [provider]  PARoxetine (PAXIL) 40 MG tablet Take 40 mg by mouth at bedtime. 09/28/16  Yes [provider]  vitamin B-12 (CYANOCOBALAMIN) 1000 MCG tablet Take 1,000 mcg by mouth daily.   Yes [provider]  vitamin E 400 UNIT capsule Take 400 Units by mouth daily.    Yes [provider]  conjugated estrogens (PREMARIN) vaginal cream Place 1 Applicatorful vaginally daily. Patient not taking: Reported on 05/19/2017 05/19/16   Zara Council A, PA-C  oxyCODONE (OXY IR/ROXICODONE) 5 MG immediate release tablet Take 1-2 tablets (5-10 mg total) by mouth every 3 (three) hours as needed for breakthrough pain. Patient not taking: Reported on 05/19/2017 09/26/15   Henreitta Leber, MD  solifenacin (VESICARE) 10 MG tablet Take 1 tablet (10 mg total) by mouth daily. Patient not taking: Reported on 05/19/2017 05/19/16   Zara Council A, PA-C    Allergies Cephalexin; Nitrofurantoin; Other; Sulfa antibiotics; and Atorvastatin  Family History  Problem Relation Age of Onset  . Kidney disease Brother        also nephew  . Heart disease Mother   . Heart disease Father   . Prostate cancer Neg Hx   . Bladder Cancer Neg Hx   . Breast cancer Neg Hx   . Kidney cancer Neg Hx     Social History Social History  Substance Use Topics  . Smoking status: Never Smoker  . Smokeless tobacco: Never Used  . Alcohol use No    Review of Systems Constitutional: No fever/chills Eyes: No visual changes. ENT: No sore throat. Cardiovascular: Denies chest pain. Respiratory: Denies shortness of breath. Gastrointestinal: No abdominal pain.  No nausea, no vomiting.  No diarrhea.  No constipation. Genitourinary: Negative for  dysuria. Musculoskeletal: Negative for back pain. Skin: Negative for rash. Neurological: positive for headaches   ____________________________________________   PHYSICAL EXAM:  VITAL SIGNS: ED Triage Vitals [05/19/17 1434]  Enc Vitals Group     BP (!) 229/62     Pulse Rate (!) 59     Resp 20     Temp 98.1 F (36.7 C)     Temp Source Oral     SpO2 99 %     Weight      Height      Head Circumference      Peak Flow      Pain Score      Pain Loc      Pain Edu?      Excl. in [place]?     Constitutional: alert and oriented 4 appropriate cooperative speaks in full clear sentences no diaphoresis Eyes: PERRL EOMI. Head: Atraumatic. Nose: No congestion/rhinnorhea. Mouth/Throat: No trismus Neck: No stridor.   Cardiovascular: Normal rate, regular rhythm. Grossly normal heart sounds.  Good peripheral circulation.  Respiratory: Normal respiratory effort.  No retractions. Lungs CTAB and moving good air Gastrointestinal: soft nontender Musculoskeletal: No lower extremity edema   Neurologic:  Normal speech and language. No gross focal neurologic deficits are appreciated. Skin:  Skin is warm, dry and intact. No rash noted. Psychiatric: Mood and affect are normal. Speech and behavior are normal.    ____________________________________________   DIFFERENTIAL includes but not limited to  hypertensive emergency, hypertensive urgency, asymptomatic hypertension, headache ____________________________________________   LABS (all labs ordered are listed, but only abnormal results are displayed)  Labs Reviewed  CBC WITH DIFFERENTIAL/PLATELET - Abnormal; Notable for the following:       Result Value   Hemoglobin 11.4 (*)    RDW 16.4 (*)    All other components within normal limits  COMPREHENSIVE METABOLIC PANEL - Abnormal; Notable for the following:    Potassium 3.4 (*)    Chloride 99 (*)    Glucose, Bld 149 (*)    ALT 9 (*)    Total Bilirubin 0.2 (*)    All other components within  normal limits  BRAIN NATRIURETIC PEPTIDE - Abnormal; Notable for the following:    B Natriuretic Peptide 145.0 (*)    All other components within normal limits  TROPONIN I    blood work reviewed by me shows no acute disease __________________________________________  EKG  ED ECG REPORT I, Darel Hong, the attending physician, personally viewed and interpreted this ECG.  Date: 05/19/2017 EKG Time:  Rate: 61 Rhythm: normal sinus rhythm QRS Axis: normal Intervals: normal ST/T Wave abnormalities: normal Narrative Interpretation: no evidence of acute ischemia  ____________________________________________  RADIOLOGY  head CT reviewed by me shows no acute disease ____________________________________________   PROCEDURES  Procedure(s) performed: no  Procedures  Critical Care performed: no  Observation: no ____________________________________________   INITIAL IMPRESSION / ASSESSMENT AND PLAN / ED COURSE  Pertinent labs & imaging results that were available during my care of the patient were reviewed by me and considered in my medical decision making (see chart for details).  the patient arrives neurologically intact with a mild headache although is remarkably hypertensive. She would like to not be admitted to the hospital so I will give her a trial of oral medications reevaluate.     ----------------------------------------- 4:18 PM on 05/19/2017 -----------------------------------------  The patient does have some headache although it is not thunderclap and she has neuro intact. According to the [place] 8 Hypertension Guideline Algorithm she has room to go up on her HCTZ and she can also potentially begin another antihypertensive in a new class.  She's already taking HCTZ, and ARB, and a CCB and her HR is borderline low so I'm hesitant to initiate a beta blocker.   ____________________________________________   after observation 1 hour after multiple  antihypertensive agents the patient's blood pressure still in 250s. At this point she requires intravenous blood pressure control and she will require inpatient admission. She verbalizes understanding and agreement with the plan.  FINAL CLINICAL IMPRESSION(S) / ED DIAGNOSES  Final diagnoses:  Hypertension, unspecified type  Nonintractable headache, unspecified chronicity pattern, unspecified headache type  Hypertensive urgency      NEW MEDICATIONS STARTED DURING THIS VISIT:  New Prescriptions   No medications on file     Note:  This document was prepared using Dragon voice recognition software and may include unintentional dictation errors.     Darel Hong, MD 05/19/17 2042

## 2017-05-19 NOTE — ED Triage Notes (Signed)
Pt was sent over for further eval of high blood pressure. Pt did take her meds this am.
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
## 2017-05-19 NOTE — ED Notes (Signed)
Patient is sitting in a wheelchair for comfort.

## 2017-05-20 LAB — CBC
HCT: 34.3 % — ABNORMAL LOW (ref 35.0–47.0)
HEMOGLOBIN: 11 g/dL — AB (ref 12.0–16.0)
MCH: 27.4 pg (ref 26.0–34.0)
MCHC: 32 g/dL (ref 32.0–36.0)
MCV: 85.6 fL (ref 80.0–100.0)
PLATELETS: 174 10*3/uL (ref 150–440)
RBC: 4.01 MIL/uL (ref 3.80–5.20)
RDW: 16.1 % — ABNORMAL HIGH (ref 11.5–14.5)
WBC: 5.3 10*3/uL (ref 3.6–11.0)

## 2017-05-20 LAB — HEMOGLOBIN A1C
HEMOGLOBIN A1C: 6.8 % — AB (ref 4.8–5.6)
Mean Plasma Glucose: 148.46 mg/dL

## 2017-05-20 LAB — GLUCOSE, CAPILLARY
Glucose-Capillary: 126 mg/dL — ABNORMAL HIGH (ref 65–99)
Glucose-Capillary: 141 mg/dL — ABNORMAL HIGH (ref 65–99)
Glucose-Capillary: 151 mg/dL — ABNORMAL HIGH (ref 65–99)
Glucose-Capillary: 124 mg/dL — ABNORMAL HIGH (ref 65–99)

## 2017-05-20 LAB — BASIC METABOLIC PANEL
ANION GAP: 6 (ref 5–15)
BUN: 11 mg/dL (ref 6–20)
CHLORIDE: 103 mmol/L (ref 101–111)
CO2: 32 mmol/L (ref 22–32)
Calcium: 9.2 mg/dL (ref 8.9–10.3)
Creatinine, Ser: 0.4 mg/dL — ABNORMAL LOW (ref 0.44–1.00)
GFR calc Af Amer: >60 mL/min (ref >60)
GFR calc non Af Amer: >60 mL/min (ref >60)
GLUCOSE: 135 mg/dL — AB (ref 65–99)
POTASSIUM: 3.7 mmol/L (ref 3.5–5.1)
Sodium: 141 mmol/L (ref 135–145)

## 2017-05-20 MED ORDER — MUPIROCIN 2 % EX OINT
1.0000 "application " | TOPICAL_OINTMENT | Freq: Two times a day (BID) | CUTANEOUS | Status: DC
  Administered 2017-05-20 – 2017-05-21 (×3): 1 via NASAL
  Filled 2017-05-20: qty 22

## 2017-05-20 MED ORDER — METOPROLOL TARTRATE 25 MG PO TABS: 25.0000 mg | ORAL_TABLET | Freq: Two times a day (BID) | ORAL | Status: DC

## 2017-05-20 MED ORDER — INSULIN ASPART 100 UNIT/ML ~~LOC~~ SOLN
0.0000 [IU] | Freq: Three times a day (TID) | SUBCUTANEOUS | Status: DC
  Administered 2017-05-20: 2 [IU] via SUBCUTANEOUS
  Administered 2017-05-20 – 2017-05-21 (×4): 1 [IU] via SUBCUTANEOUS
  Filled 2017-05-20 (×5): qty 1

## 2017-05-20 MED ORDER — INSULIN ASPART 100 UNIT/ML ~~LOC~~ SOLN: 0.0000 [IU] | Freq: Every day | SUBCUTANEOUS | Status: DC

## 2017-05-20 MED ORDER — HYDROCHLOROTHIAZIDE 25 MG PO TABS
25.0000 mg | ORAL_TABLET | Freq: Every day | ORAL | Status: DC
  Administered 2017-05-20 – 2017-05-21 (×2): 25 mg via ORAL
  Filled 2017-05-20 (×2): qty 1

## 2017-05-20 MED ORDER — METOPROLOL TARTRATE 25 MG PO TABS
25.0000 mg | ORAL_TABLET | Freq: Two times a day (BID) | ORAL | Status: DC
  Administered 2017-05-20 (×2): 25 mg via ORAL
  Filled 2017-05-20 (×2): qty 1

## 2017-05-20 MED ORDER — HYDROCHLOROTHIAZIDE 25 MG PO TABS: 25.0000 mg | ORAL_TABLET | Freq: Every day | ORAL | 2 refills | Status: DC

## 2017-05-20 MED ORDER — CHLORHEXIDINE GLUCONATE CLOTH 2 % EX PADS
6.0000 | MEDICATED_PAD | Freq: Every day | CUTANEOUS | Status: DC
  Administered 2017-05-21: 6 via TOPICAL

## 2017-05-20 NOTE — Progress Notes (Signed)
Patient requested Flu shot before discharge

## 2017-05-20 NOTE — Evaluation (Signed)
Physical Therapy Evaluation Patient Details Name: Mary [name] MRN: [id number] DOB: 08-Mar-1935 Today's Date: 05/20/2017   History of Present Illness  pt is a 82 y.o F addmited on 05/19/2017 with dx of hypertensive urgency. pt has a hx of HTN, HLD, DM, GERD, sleep apena. She was admiited with systolic BP at 976 and demonstrated mild headache with some blurry vision but no chest pain, shortness of breath, nausea.   Clinical Impression  Upon entering the room pt was laying in bed with friends present. She currently reported 2-3/10 reporting HA. She currently demonstrates functional strength in bil LE and UE. Prior to treatment pt's BP was 190/67 while sitting on EOB. She was able to ambulate in hall 180ft with [facility] and navigate up/ down 4 steps reporting she felt better. Once pt had returned back to the room BP was 208/59 while standing at edge of bed. She performed exercises with not report of pain. She would benefit from continued physical therapy to reduce muscle weakness, improve gait efficiency and endurance. Pt plans to return back home once she is discharged, she would benefit from follow up with HHPT.     Follow Up Recommendations Home health PT    Equipment Recommendations   [facility])    Recommendations for Other Services       Precautions / Restrictions Precautions Precautions: Fall Restrictions Weight Bearing Restrictions: No      Mobility  Bed Mobility Overal bed mobility: Independent             General bed mobility comments: verbal cues to slow down and wait to be cued for standing  Transfers Overall transfer level: Independent Equipment used: Straight cane                Ambulation/Gait Ambulation/Gait assistance: Supervision Ambulation Distance (Feet): 100 Feet Assistive device: Straight cane Gait Pattern/deviations: Antalgic;Decreased stride length;Step-through pattern     General Gait Details: pt demonstrated and stated she is a slow  walker  Stairs Stairs: Yes Stairs assistance: Supervision Stair Management: One rail Left;Step to pattern;With cane Number of Stairs: 4 General stair comments: pt required verbal cues on proper form with stepping up/down stairs, and using SPC  Wheelchair Mobility    Modified Rankin (Stroke Patients Only)       Balance                                             Pertinent Vitals/Pain Pain Assessment: 0-10 Pain Score: 3  Pain Location: headache Pain Descriptors / Indicators: [place] expects to be discharged to:: Private residence Living Arrangements: Children;Non-relatives/Friends Available Help at Discharge: Family;Available PRN/intermittently Type of Home: House Home Access: Stairs to enter Entrance Stairs-Rails: Can reach both Entrance Stairs-Number of Steps: 4 Home Layout: One level Home Equipment: Walker - 2 wheels;Cane - single point      Prior Function Level of Independence: Independent with assistive device(s)         Comments: used SPC for in home ambulatio per pt report     Hand Dominance   Dominant Hand: Right    Extremity/Trunk Assessment   Upper Extremity Assessment Upper Extremity Assessment: Overall WFL for tasks assessed    Lower Extremity Assessment Lower Extremity Assessment: Overall WFL for tasks assessed (4/5)       Communication   Communication: No difficulties  Cognition Arousal/Alertness: Awake/alert  General Comments      Exercises Other Exercises Other Exercises: marching 2 x 10 while sitting EOB, LAQ 2 x 10, ankle pumps   Assessment/Plan    PT Assessment Patient needs continued PT services  PT Problem List Decreased strength;Decreased range of motion;Decreased activity tolerance;Decreased balance;Decreased mobility       PT Treatment Interventions Gait training;Stair training;Functional mobility  training;Therapeutic activities;Therapeutic exercise;Manual techniques    PT Goals (Current goals can be found in the Care Plan section)  Acute Rehab PT Goals Patient Stated Goal: to go home PT Goal Formulation: With patient Time For Goal Achievement: 06/03/17 Potential to Achieve Goals: Good    Frequency Min 2X/week   Barriers to discharge        Co-evaluation               AM-PAC PT "6 Clicks" Daily Activity  Outcome Measure Difficulty turning over in bed (including adjusting bedclothes, sheets and blankets)?: A Little Difficulty moving from lying on back to sitting on the side of the bed? : A Little Difficulty sitting down on and standing up from a chair with arms (e.g., wheelchair, bedside commode, etc,.)?: A Little Help needed moving to and from a bed to chair (including a wheelchair)?: None Help needed walking in hospital room?: A Lot Help needed climbing 3-5 steps with a railing? : A Lot 6 Click Score: 17    End of Session Equipment Utilized During Treatment: Gait belt [facility]) Activity Tolerance: Patient limited by fatigue;Patient tolerated treatment well Patient left: in chair;with bed alarm set (nurse nofitied of how session went) Nurse Communication: Mobility status;Other (comment) (BP during and following exercise) PT Visit Diagnosis: Muscle weakness (generalized) (M62.81);Unsteadiness on feet (R26.81);Other abnormalities of gait and mobility (R26.89);Dizziness and giddiness (R42);Difficulty in walking, not elsewhere classified (R26.2)    Time: 1431-1500 PT Time Calculation (min) (ACUTE ONLY): 29 min   Charges:   PT Evaluation $PT Eval Moderate Complexity: 1 Mod PT Treatments $Gait Training: 8-22 mins $Therapeutic Exercise: 8-22 mins   PT G Codes:   PT G-Codes **NOT FOR INPATIENT CLASS** Functional Assessment Tool Used: AM-PAC 6 Clicks Basic Mobility Functional Limitation: Mobility: Walking and moving around Mobility: Walking and Moving Around Current  Status (X3244): At least 40 percent but less than 60 percent impaired, limited or restricted Mobility: Walking and Moving Around Goal Status [phone]): At least 40 percent but less than 60 percent impaired, limited or restricted    Kristoffer Leamon PT, DPT, LAT, ATC  05/20/17  3:28 PM       Leamon, Cheryle Horsfall 05/20/2017, 3:24 PM
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
## 2017-05-20 NOTE — Progress Notes (Signed)
[place] at [place] NAME: Mary [name]    MR#:  [id number]  DATE OF BIRTH:  07/19/1935  SUBJECTIVE:  CHIEF COMPLAINT:   Chief Complaint  Patient presents with  . Hypertension   Better headache. REVIEW OF SYSTEMS:  Review of Systems  Constitutional: Positive for malaise/fatigue. Negative for chills and fever.  HENT: Negative for sore throat.   Eyes: Negative for blurred vision and double vision.  Respiratory: Negative for cough, hemoptysis, shortness of breath, wheezing and stridor.   Cardiovascular: Negative for chest pain, palpitations, orthopnea and leg swelling.  Gastrointestinal: Negative for abdominal pain, blood in stool, diarrhea, melena, nausea and vomiting.  Genitourinary: Negative for dysuria, flank pain and hematuria.  Musculoskeletal: Negative for back pain and joint pain.  Skin: Negative for rash.  Neurological: Positive for weakness and headaches. Negative for dizziness, sensory change, focal weakness, seizures and loss of consciousness.  Endo/Heme/Allergies: Negative for polydipsia.  Psychiatric/Behavioral: Negative for depression. The patient is not nervous/anxious.     DRUG ALLERGIES:   Allergies  Allergen Reactions  . Cephalexin Hives  . Nitrofurantoin     Other reaction(s): Other (See Comments) Other Reaction: measles-like lesions  . Other     Other reaction(s): Other (See Comments) Uncoded Allergy. Allergen: microdantin, Other Reaction: Measle-like Lesions  . Sulfa Antibiotics Hives  . Atorvastatin Other (See Comments)    Muscle spasms   VITALS:  Blood pressure (!) 167/50, pulse 81, temperature (!) 97.5 F (36.4 C), temperature source Oral, resp. rate 18, height 5\' 5"  (1.651 m), weight 198 lb 1.6 oz (89.9 kg), SpO2 96 %. PHYSICAL EXAMINATION:  Physical Exam  Constitutional: She is oriented to person, place, and time and well-developed, well-nourished, and in no distress.  HENT:  Head: Normocephalic.    Mouth/Throat: Oropharynx is clear and moist.  Eyes: Pupils are equal, round, and reactive to light. Conjunctivae and EOM are normal. No scleral icterus.  Neck: Normal range of motion. Neck supple. No JVD present. No tracheal deviation present.  Cardiovascular: Normal rate, regular rhythm and normal heart sounds.  Exam reveals no gallop.   No murmur heard. Pulmonary/Chest: Effort normal and breath sounds normal. No respiratory distress. She has no wheezes. She has no rales.  Abdominal: Soft. Bowel sounds are normal. She exhibits no distension. There is no tenderness. There is no rebound.  Musculoskeletal: Normal range of motion. She exhibits no edema or tenderness.  Neurological: She is alert and oriented to person, place, and time. No cranial nerve deficit.  Skin: No rash noted. No erythema.  Psychiatric: Affect normal.   LABORATORY PANEL:  Female CBC  Recent Labs Lab 05/20/17 0520  WBC 5.3  HGB 11.0*  HCT 34.3*  PLT 174   ------------------------------------------------------------------------------------------------------------------ Chemistries   Recent Labs Lab 05/19/17 1440 05/20/17 0520  NA 138 141  K 3.4* 3.7  CL 99* 103  CO2 29 32  GLUCOSE 149* 135*  BUN 13 11  CREATININE 0.61 0.40*  CALCIUM 9.5 9.2  AST 22  --   ALT 9*  --   ALKPHOS 82  --   BILITOT 0.2*  --    RADIOLOGY:  Ct Head Wo Contrast  Result Date: 05/19/2017 CLINICAL DATA:  Hypertension with slight headache. EXAM: CT HEAD WITHOUT CONTRAST TECHNIQUE: Contiguous axial images were obtained from the base of the skull through the vertex without intravenous contrast. COMPARISON:  09/26/2015 FINDINGS: Brain: Ventricles and cisterns are within normal. There is mild age related atrophic change. Old  lacunar infarcts over the left lentiform nucleus as well as adjacent the right atria of the lateral ventricle and right centrum semiovale. No mass, mass effect, shift of midline structures or acute hemorrhage. No  evidence of acute infarction. Vascular: No hyperdense vessel or unexpected calcification. Skull: Normal. Negative for fracture or focal lesion. Sinuses/Orbits: No acute finding. Other: None. IMPRESSION: No acute intracranial findings. Small old lacunar infarcts as described as well as age related atrophic change. Electronically Signed   By: Marin Olp M.D.   On: 05/19/2017 18:47   Dg Chest Port 1 View  Result Date: 05/19/2017 CLINICAL DATA:  Dyspnea EXAM: PORTABLE CHEST 1 VIEW COMPARISON:  01/06/2017 FINDINGS: Stable cardiomegaly. Minimal aortic atherosclerosis at the arch. No pneumonic consolidation, overt pulmonary edema, effusion or pneumothorax. Minimal subsegmental atelectasis and/or scarring in the left mid lung. No acute nor suspicious osseous abnormality. IMPRESSION: No active disease. Mild cardiomegaly with minimal aortic atherosclerosis. Electronically Signed   By: Ashley Royalty M.D.   On: 05/19/2017 18:15   ASSESSMENT AND PLAN:   82 year old female with past medical history of hypertension, hyperlipidemia, diabetes, obstructive sleep apnea, depression, GERD who presents to the hospital due to accelerated hypertension.  1. Hypertensive urgency-patient presents to the hospital with systolic blood pressures greater than 220. Some of this is related to her stress at home. Not likely related to noncompliance as patient's takes her medications regularly.  continue IV dose of hydralazine. Continue Norvasc, losartan and increased HCTZ, but SBP is still high above 200 per RN just now. Add lopressor bid.  2. Diabetes type 2 without complication. Sliding scale.  3. GERD-continue Protonix.  4. Anxiety-continue Paxil, BuSpar.  5. Hypothyroidism-continue Synthroid.  Generalized weakness. PT evaluation suggested home health and PT. All the records are reviewed and case discussed with Care Management/Social Worker. Management plans discussed with the patient, family and they are in  agreement.  CODE STATUS: Full Code  TOTAL TIME TAKING CARE OF THIS PATIENT: 37 minutes.   More than 50% of the time was spent in counseling/coordination of care: YES  POSSIBLE D/C IN 2 DAYS, DEPENDING ON CLINICAL CONDITION.   Demetrios Loll M.D on 05/20/2017 at 3:38 PM  Between 7am to 6pm - Pager - [phone]  After 6pm go to [email or web address] - [place] [facility]

## 2017-05-20 NOTE — Discharge Instructions (Signed)
Heart healthy and ADA diet. °

## 2017-05-20 NOTE — Care Management Obs Status (Signed)
[place] NOTIFICATION   Patient Details  Name: Mary [name] MRN: [id number] Date of Birth: 27-Apr-1935   Medicare Observation Status Notification Given:  Yes    Marshell Garfinkel, RN 05/20/2017, 10:33 AM

## 2017-05-21 LAB — GLUCOSE, CAPILLARY
Glucose-Capillary: 121 mg/dL — ABNORMAL HIGH (ref 65–99)
Glucose-Capillary: 123 mg/dL — ABNORMAL HIGH (ref 65–99)

## 2017-05-21 MED ORDER — HYDRALAZINE HCL 25 MG PO TABS
25.0000 mg | ORAL_TABLET | Freq: Three times a day (TID) | ORAL | Status: DC
  Administered 2017-05-21: 25 mg via ORAL

## 2017-05-21 MED ORDER — ISOSORBIDE MONONITRATE ER 30 MG PO TB24: 30.0000 mg | ORAL_TABLET | Freq: Every day | ORAL | 0 refills | Status: DC

## 2017-05-21 MED ORDER — HYDRALAZINE HCL 25 MG PO TABS: 25.0000 mg | ORAL_TABLET | Freq: Three times a day (TID) | ORAL | 0 refills | Status: DC

## 2017-05-21 MED ORDER — ISOSORBIDE MONONITRATE ER 30 MG PO TB24
30.0000 mg | ORAL_TABLET | Freq: Every day | ORAL | Status: DC
  Administered 2017-05-21: 30 mg via ORAL
  Filled 2017-05-21: qty 1

## 2017-05-21 NOTE — Care Management Note (Signed)
Case Management Note  Patient Details  Name: Mary [name] MRN: [id number] Date of Birth: [birth year]  Subjective/Objective:     Discussed discharge planning with [name]. After an explanation of Home Health services, [name] agreed to accept HH-PT and RN and chose [facility] to be her home health provider from a verbal review of local [facility] providers.                 Action/Plan:   Expected Discharge Date:  05/21/17               Expected Discharge Plan:  [place]  In-House Referral:  NA  Discharge planning Services  NA  Post Acute Care Choice:  NA Choice offered to:  Patient  DME Arranged:  N/A DME Agency:  NA  HH Arranged:  PT, RN [place] Agency:  [facility]  Status of Service:  Completed, signed off  If discussed at [place] of Stay Meetings, dates discussed:    Additional Comments:  [name] A, RN 05/21/2017, 9:22 AM

## 2017-05-21 NOTE — Progress Notes (Signed)
Pt discharged home with sitter walker her to car. Ambulated okay. Skin warm and dry. No complaints  Of pain. New medication given.

## 2017-05-21 NOTE — Discharge Summary (Signed)
[facility] [place] NAME: Mary [name]    MR#:  [id number]  DATE OF BIRTH:  [birth year]  DATE OF ADMISSION:  05/19/2017   ADMITTING PHYSICIAN: Henreitta Leber, MD  DATE OF DISCHARGE: 05/21/2017 12:49 PM  PRIMARY CARE PHYSICIAN: Dion Body, MD   ADMISSION DIAGNOSIS:  Hypertensive urgency [I16.0] Nonintractable headache, unspecified chronicity pattern, unspecified headache type [R51] Hypertension, unspecified type [I10] DISCHARGE DIAGNOSIS:  Active Problems:   Hypertensive urgency  SECONDARY DIAGNOSIS:   Past Medical History:  Diagnosis Date  . Acid reflux   . Arthritis   . Asthma   . Depression   . Diabetes ([place])   . Heart murmur   . HLD (hyperlipidemia)   . HTN (hypertension)   . Sleep apnea    HOSPITAL COURSE:   82 year old female with past medical history of hypertension, hyperlipidemia, diabetes, obstructive sleep apnea, depression, GERD who presents to the hospital due to accelerated hypertension.  1. Hypertensive urgency-patient presents to the hospital with systolic blood pressures greater than 220. Some of this is related to her stress at home. Not likely related to noncompliance as patient's takes her medications regularly.  She got IV dose of hydralazine. Continue Norvasc, losartan and increased HCTZ, but SBP is still high. Added lopressor bid. The patient developed bradycardia. Discontinued Lopressor, start hydralazine 25 mg every 8 hours and imdur 30 mg by mouth daily. Blood pressure is better controlled. Follow-up PCPto adjust hypertension medication.  2. Diabetes type 2 without complication. Sliding scale.  3. GERD-continue Protonix.  4. Anxiety-continue Paxil, BuSpar.  5. Hypothyroidism-continue Synthroid.  DISCHARGE CONDITIONS:  Stable, discharge to home today. CONSULTS OBTAINED:   DRUG ALLERGIES:   Allergies  Allergen Reactions  . Cephalexin Hives  . Nitrofurantoin     Other  reaction(s): Other (See Comments) Other Reaction: measles-like lesions  . Other     Other reaction(s): Other (See Comments) Uncoded Allergy. Allergen: microdantin, Other Reaction: Measle-like Lesions  . Sulfa Antibiotics Hives  . Atorvastatin Other (See Comments)    Muscle spasms   DISCHARGE MEDICATIONS:   Allergies as of 05/21/2017      Reactions   Cephalexin Hives   Nitrofurantoin    Other reaction(s): Other (See Comments) Other Reaction: measles-like lesions   Other    Other reaction(s): Other (See Comments) Uncoded Allergy. Allergen: microdantin, Other Reaction: Measle-like Lesions   Sulfa Antibiotics Hives   Atorvastatin Other (See Comments)   Muscle spasms      Medication List    TAKE these medications   amLODipine 10 MG tablet Commonly known as:  NORVASC Take 10 mg by mouth daily.   aspirin 81 MG tablet Take 81 mg by mouth daily.   busPIRone 10 MG tablet Commonly known as:  BUSPAR Take 10 mg by mouth daily.   conjugated estrogens vaginal cream Commonly known as:  PREMARIN Place 1 Applicatorful vaginally daily.   [employer] DRY EYE RELIEF 0.2-0.2-1 % Soln Generic drug:  Glycerin-Hypromellose-PEG 400 Apply 1 drop to eye daily.   hydrALAZINE 25 MG tablet Commonly known as:  APRESOLINE Take 1 tablet (25 mg total) by mouth every 8 (eight) hours.   hydrochlorothiazide 25 MG tablet Commonly known as:  HYDRODIURIL Take 1 tablet (25 mg total) by mouth daily. What changed:  medication strength  how much to take   isosorbide mononitrate 30 MG 24 hr tablet Commonly known as:  IMDUR Take 1 tablet (30 mg total) by mouth daily.   levothyroxine 75 MCG  tablet Commonly known as:  SYNTHROID, LEVOTHROID Take 75 mcg by mouth daily.   losartan 100 MG tablet Commonly known as:  COZAAR Take 100 mg by mouth daily.   metFORMIN 500 MG 24 hr tablet Commonly known as:  GLUCOPHAGE-XR Take 500 mg by mouth daily.   naproxen sodium 220 MG tablet Commonly known as:   ANAPROX Take 220 mg by mouth 2 (two) times daily with a meal.   omeprazole 40 MG capsule Commonly known as:  PRILOSEC Take 40 mg by mouth daily.   oxyCODONE 5 MG immediate release tablet Commonly known as:  Oxy IR/ROXICODONE Take 1-2 tablets (5-10 mg total) by mouth every 3 (three) hours as needed for breakthrough pain.   PARoxetine 40 MG tablet Commonly known as:  PAXIL Take 40 mg by mouth at bedtime.   solifenacin 10 MG tablet Commonly known as:  VESICARE Take 1 tablet (10 mg total) by mouth daily.   vitamin B-12 1000 MCG tablet Commonly known as:  CYANOCOBALAMIN Take 1,000 mcg by mouth daily.   vitamin E 400 UNIT capsule Take 400 Units by mouth daily.            Discharge Care Instructions        Start     Ordered   05/22/17 0000  isosorbide mononitrate (IMDUR) 30 MG 24 hr tablet  Daily     05/21/17 1210   05/21/17 0000  hydrochlorothiazide (HYDRODIURIL) 25 MG tablet  Daily     05/20/17 1246   05/21/17 0000  Increase activity slowly     05/21/17 0854   05/21/17 0000  Diet - low sodium heart healthy     05/21/17 0854   05/21/17 0000  hydrALAZINE (APRESOLINE) 25 MG tablet  Every 8 hours     05/21/17 1210   05/20/17 0000  Increase activity slowly     05/20/17 1017   05/20/17 0000  Diet - low sodium heart healthy     05/20/17 1017       DISCHARGE INSTRUCTIONS:  See AVS.  If you experience worsening of your admission symptoms, develop shortness of breath, life threatening emergency, suicidal or homicidal thoughts you must seek medical attention immediately by calling 911 or calling your MD immediately  if symptoms less severe.  You Must read complete instructions/literature along with all the possible adverse reactions/side effects for all the Medicines you take and that have been prescribed to you. Take any new Medicines after you have completely understood and accpet all the possible adverse reactions/side effects.   Please note  You were cared for by a  hospitalist during your hospital stay. If you have any questions about your discharge medications or the care you received while you were in the hospital after you are discharged, you can call the unit and asked to speak with the hospitalist on call if the hospitalist that took care of you is not available. Once you are discharged, your primary care physician will handle any further medical issues. Please note that NO REFILLS for any discharge medications will be authorized once you are discharged, as it is imperative that you return to your primary care physician (or establish a relationship with a primary care physician if you do not have one) for your aftercare needs so that they can reassess your need for medications and monitor your lab values.    On the day of Discharge:  VITAL SIGNS:  Blood pressure (!) 157/53, pulse (!) 47, temperature 98.4 F (36.9 C), temperature source  Oral, resp. rate 18, height 5\' 5"  (1.651 m), weight 198 lb 1.6 oz (89.9 kg), SpO2 92 %. PHYSICAL EXAMINATION:  GENERAL:  82 y.o.-year-old patient lying in the bed with no acute distress.  EYES: Pupils equal, round, reactive to light and accommodation. No scleral icterus. Extraocular muscles intact.  HEENT: Head atraumatic, normocephalic. Oropharynx and nasopharynx clear.  NECK:  Supple, no jugular venous distention. No thyroid enlargement, no tenderness.  LUNGS: Normal breath sounds bilaterally, no wheezing, rales,rhonchi or crepitation. No use of accessory muscles of respiration.  CARDIOVASCULAR: S1, S2 normal. No murmurs, rubs, or gallops.  ABDOMEN: Soft, non-tender, non-distended. Bowel sounds present. No organomegaly or mass.  EXTREMITIES: No pedal edema, cyanosis, or clubbing.  NEUROLOGIC: Cranial nerves II through XII are intact. Muscle strength 5/5 in all extremities. Sensation intact. Gait not checked.  PSYCHIATRIC: The patient is alert and oriented x 3.  SKIN: No obvious rash, lesion, or ulcer.  DATA REVIEW:    CBC  Recent Labs Lab 05/20/17 0520  WBC 5.3  HGB 11.0*  HCT 34.3*  PLT 174    Chemistries   Recent Labs Lab 05/19/17 1440 05/20/17 0520  NA 138 141  K 3.4* 3.7  CL 99* 103  CO2 29 32  GLUCOSE 149* 135*  BUN 13 11  CREATININE 0.61 0.40*  CALCIUM 9.5 9.2  AST 22  --   ALT 9*  --   ALKPHOS 82  --   BILITOT 0.2*  --      Microbiology Results  Results for orders placed or performed in visit on 03/09/17  Microscopic Examination     Status: Abnormal   Collection Time: 03/09/17 10:06 AM  Result Value Ref Range Status   WBC, UA 0-5 0 - 5 /hpf Final   RBC, UA 0-2 0 - 2 /hpf Final   Epithelial Cells (non renal) 0-10 0 - 10 /hpf Final   Bacteria, UA Few (A) None seen/Few Final  Urine Culture     Status: None   Collection Time: 03/09/17 10:49 AM  Result Value Ref Range Status   Urine Culture, Routine Final report  Final   Organism ID, Bacteria Comment  Final    Comment: Mixed urogenital flora Less than 10,000 colonies/mL     RADIOLOGY:  No results found.   Management plans discussed with the patient, family and they are in agreement.  CODE STATUS: Full Code   TOTAL TIME TAKING CARE OF THIS PATIENT: 33 minutes.    Demetrios Loll M.D on 05/21/2017 at 1:24 PM  Between 7am to 6pm - Pager - [phone]  After 6pm go to [email or web address] - Proofreader  [facility]  Office  [phone]  CC: Primary care physician; Dion Body, MD   Note: This dictation was prepared with Dragon dictation along with smaller phrase technology. Any transcriptional errors that result from this process are unintentional.

## 2017-05-27 DIAGNOSIS — M7122 Synovial cyst of popliteal space [Baker], left knee: Secondary | ICD-10-CM | POA: Insufficient documentation

## 2017-09-22 IMAGING — CR DG CHEST 2V
2 series · 2 of 2 positions shown · non-contrast
Comparison: Chest x-ray and chest CT scan September 26, 2015

CLINICAL DATA: Status post fall today low with bilateral knee pain.
The patient also reports dyspnea. No known cardiopulmonary
abnormality. Patient has a history of hypertension and diabetes.

EXAM:
CHEST  2 VIEW

[chest pa]
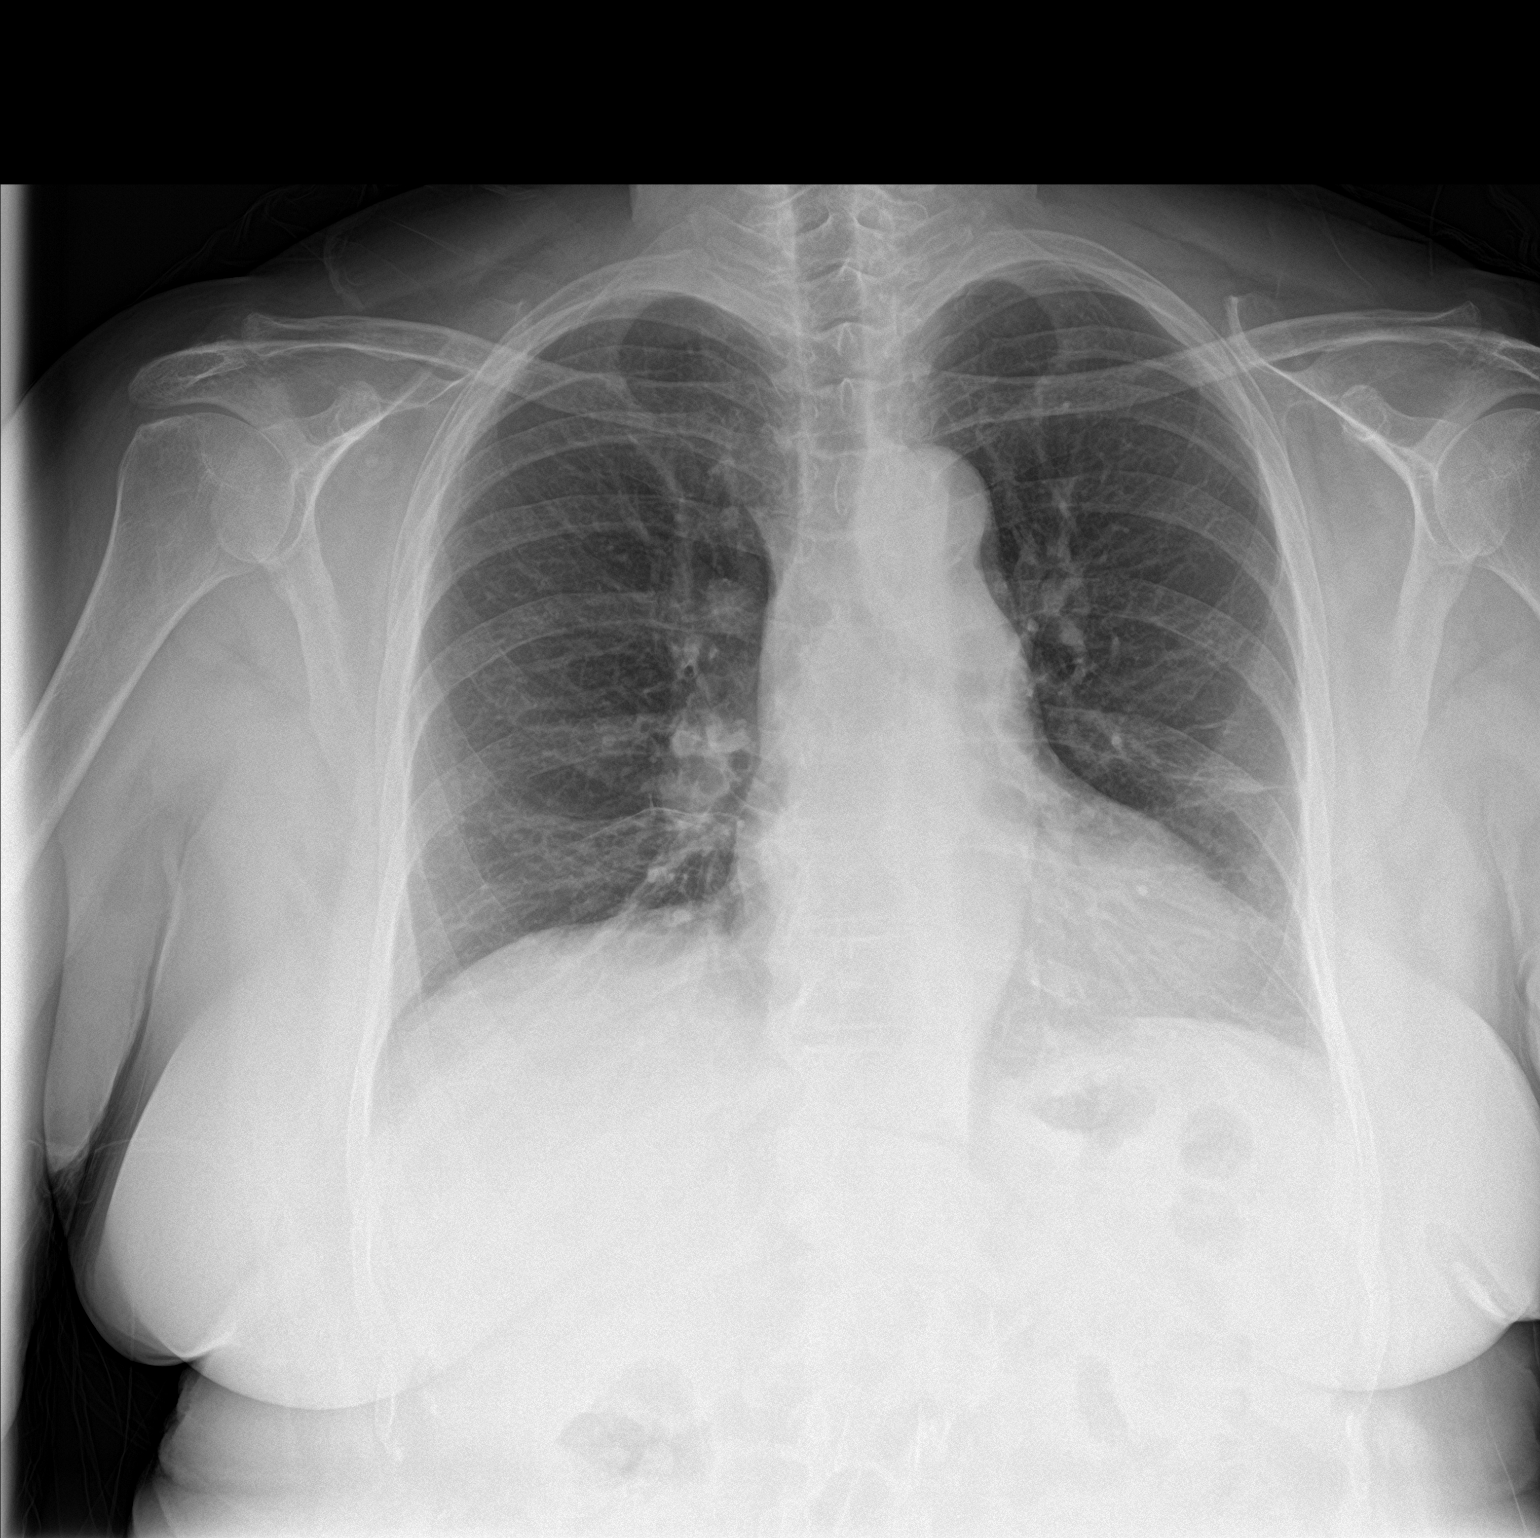

[chest lat]
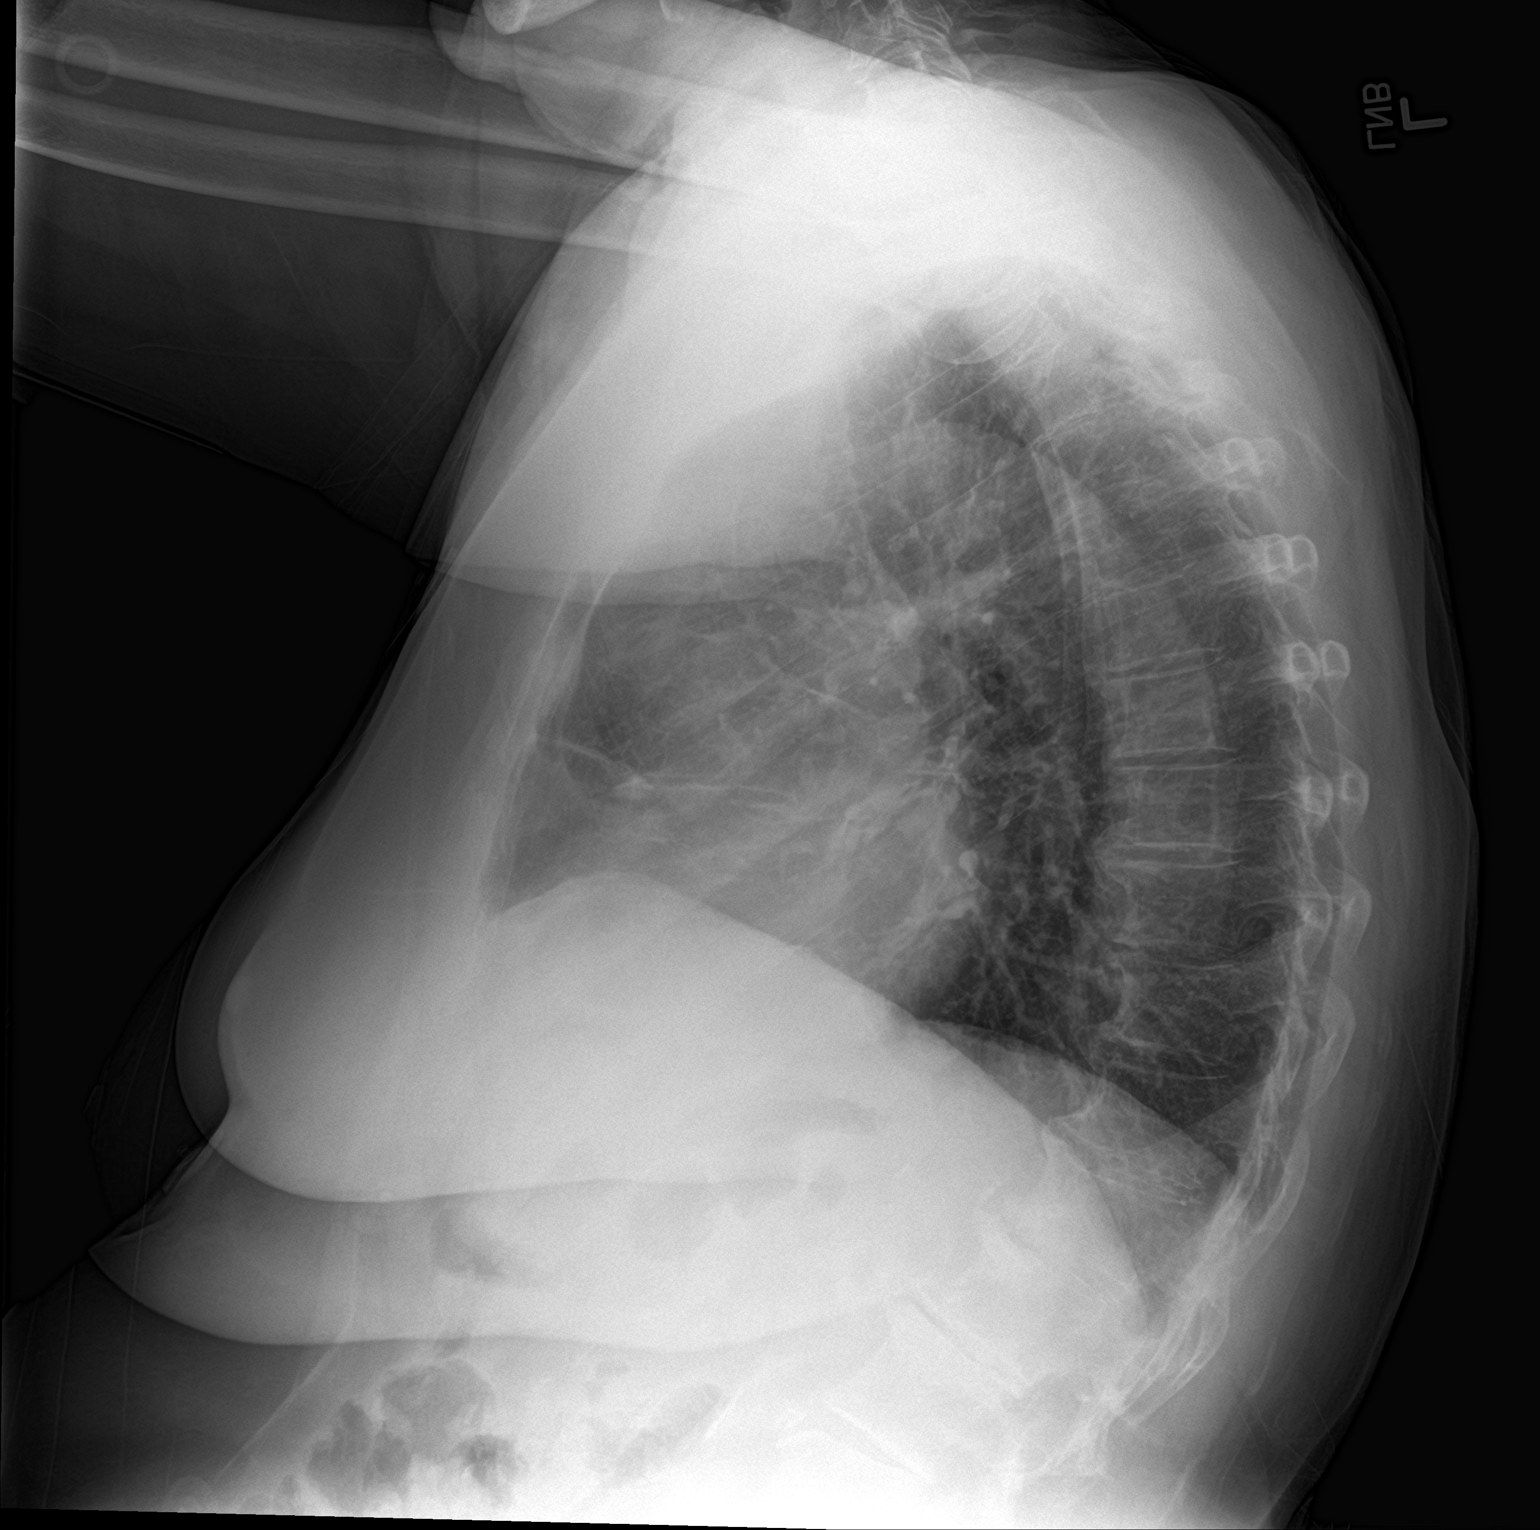

[2 of 2 positions shown; findings below may reference images not displayed]

FINDINGS: The lungs are well-expanded. Minimal linear density just above the
left cardiac apex is stable. There is no alveolar infiltrate. There
is no pleural effusion. The heart and pulmonary vascularity are
normal. There is calcification in the wall of the aortic arch. The
bony thorax exhibits no acute abnormality.
IMPRESSION: Stable scarring in the lingula. Mild chronic bronchitic changes. No
pneumonia, CHF, nor other acute cardiopulmonary abnormality.

Thoracic aortic atherosclerosis.

## 2017-09-22 IMAGING — CR DG KNEE COMPLETE 4+V*R*
4 series · 4 of 4 positions shown · non-contrast
Comparison: None in PACs

CLINICAL DATA: Status post fall. Bilateral knee pain and swelling
greatest on the left.

EXAM:
RIGHT KNEE - COMPLETE 4+ VIEW

[knee ap]
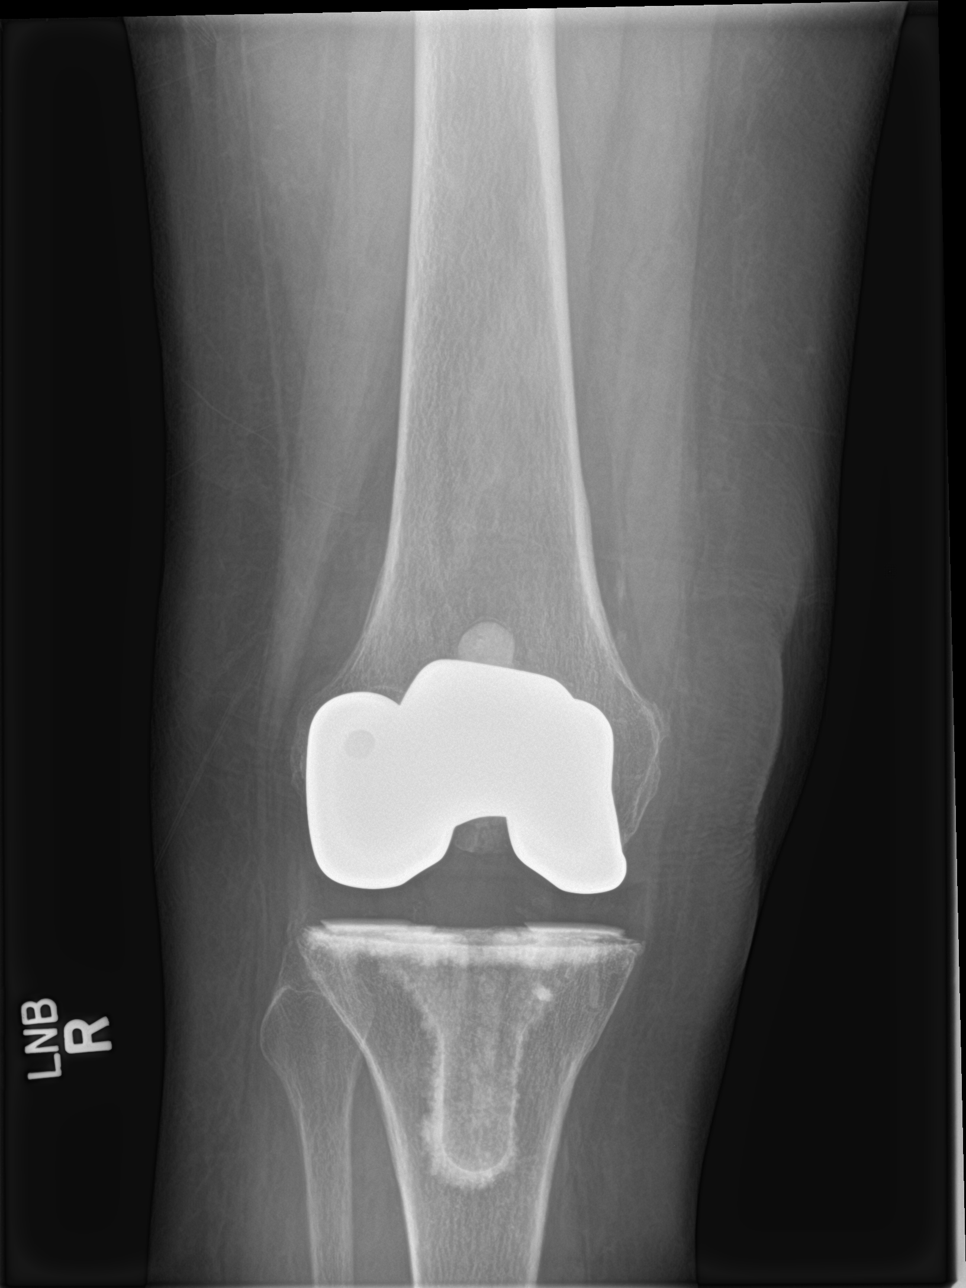

[knee obl (1 of 2)]
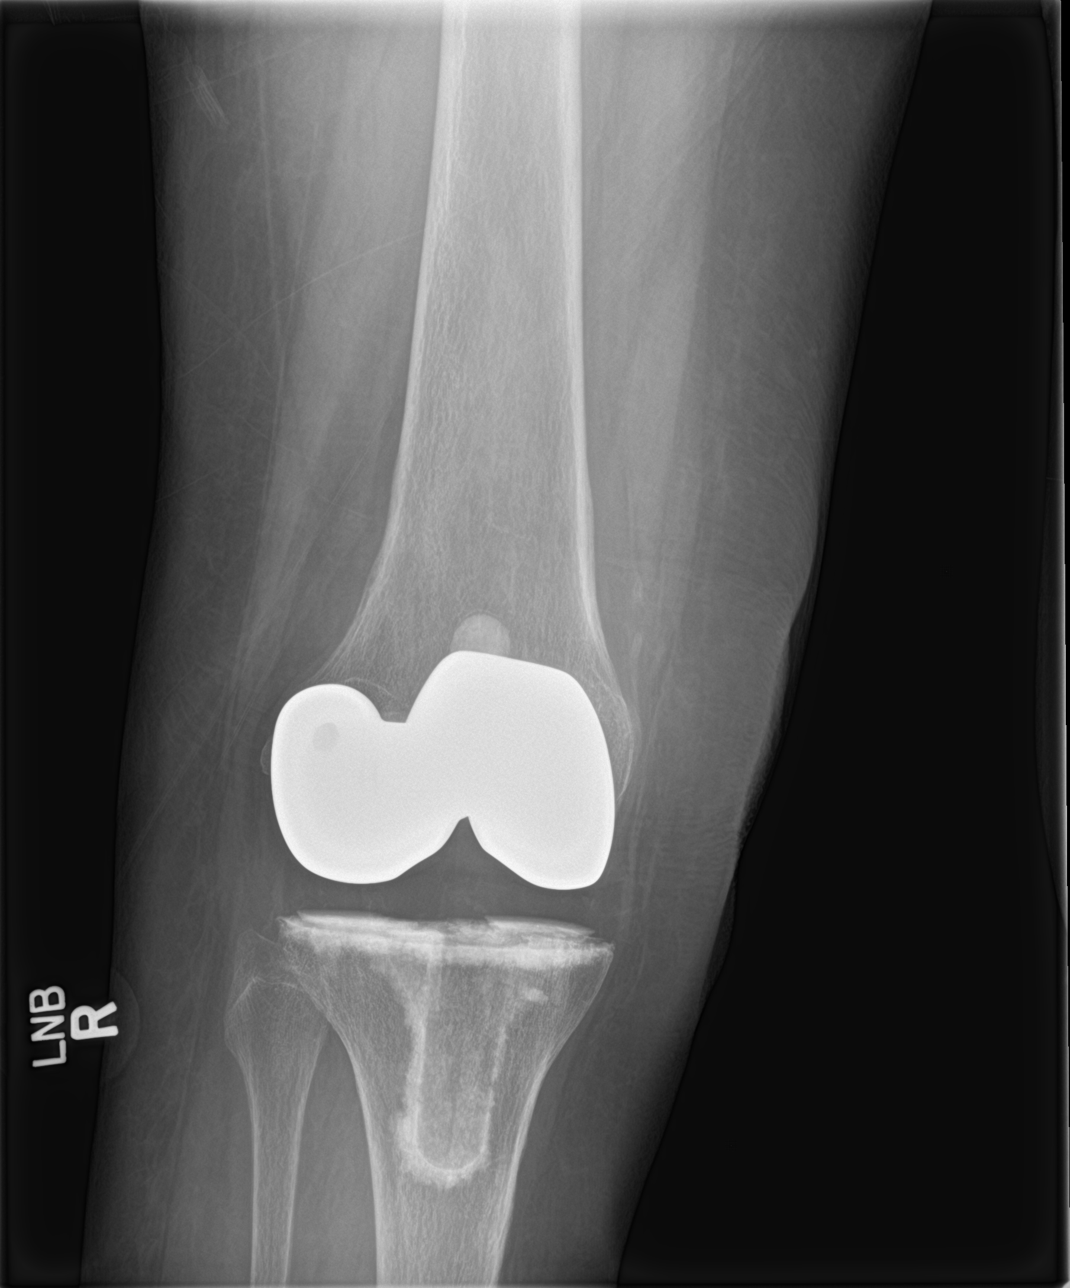

[knee obl (2 of 2)]
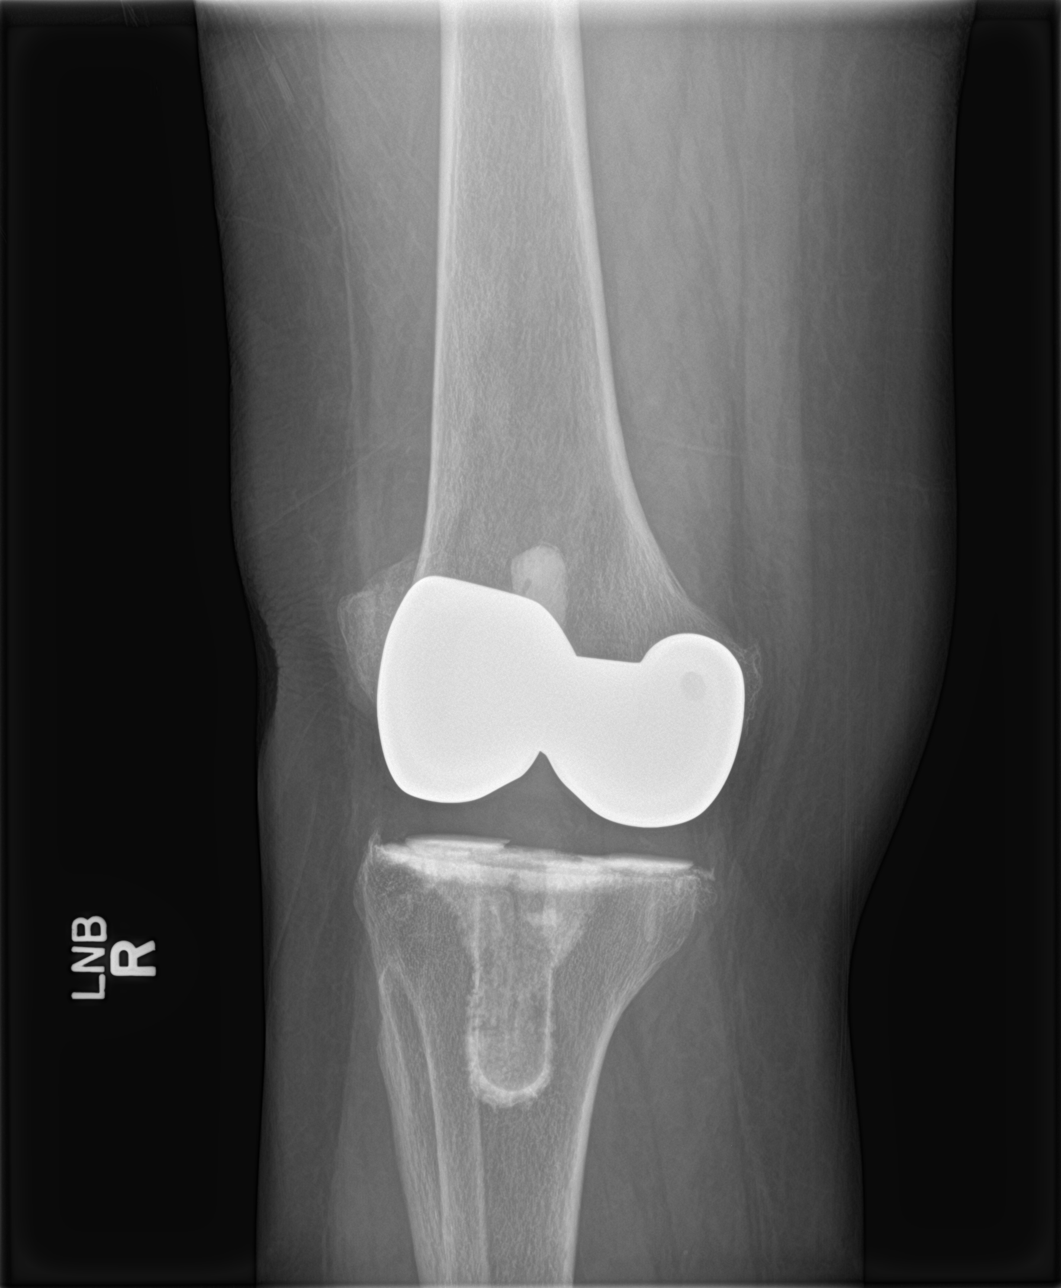

[knee lat]
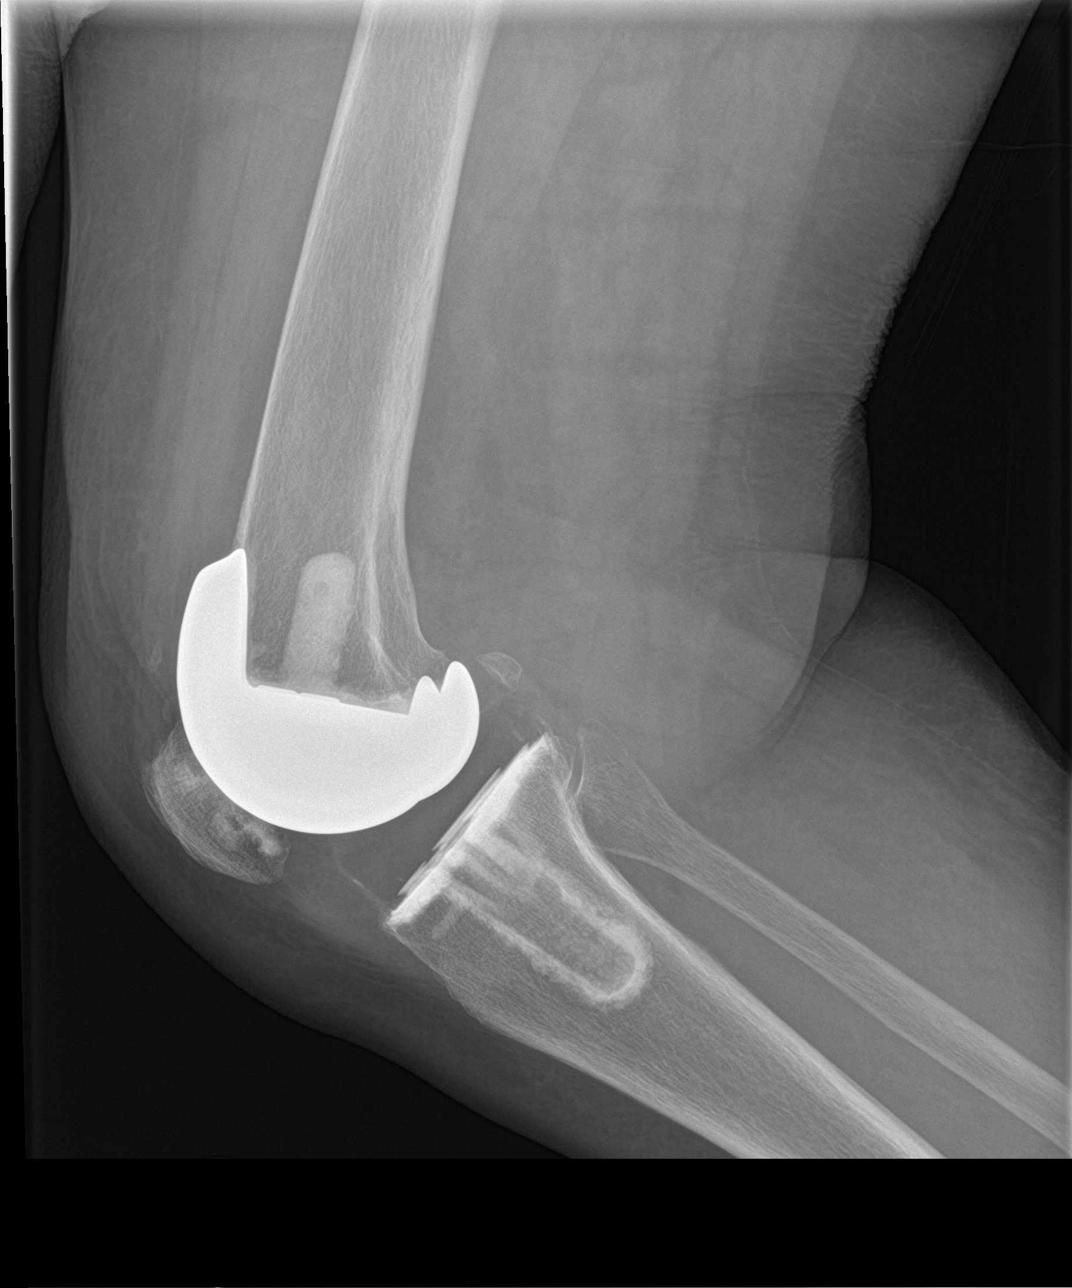

[4 of 4 positions shown; findings below may reference images not displayed]

FINDINGS: The patient has undergone previous right knee joint prosthesis
placement. Radiographic positioning of the prosthetic components is
good. The interface with the native bone appears normal. No native
bone fracture is observed.
IMPRESSION: There is no acute or significant chronic abnormality of the
prosthetic right knee joint nor of the native bone.

## 2017-11-23 ENCOUNTER — Encounter
Admission: RE | Admit: 2017-11-23 | Discharge: 2017-11-23 | Disposition: A | Discharge status: Home / Self Care | Payer: Medicare Other | Source: Ambulatory Visit | Attending: Surgery | Admitting: Surgery

## 2017-11-23 ENCOUNTER — Other Ambulatory Visit: Payer: Self-pay

## 2017-11-23 DIAGNOSIS — Z0183 Encounter for blood typing: Secondary | ICD-10-CM | POA: Diagnosis not present

## 2017-11-23 DIAGNOSIS — I10 Essential (primary) hypertension: Secondary | ICD-10-CM | POA: Insufficient documentation

## 2017-11-23 DIAGNOSIS — Z01812 Encounter for preprocedural laboratory examination: Secondary | ICD-10-CM | POA: Diagnosis not present

## 2017-11-23 DIAGNOSIS — I491 Atrial premature depolarization: Secondary | ICD-10-CM | POA: Insufficient documentation

## 2017-11-23 DIAGNOSIS — Z01818 Encounter for other preprocedural examination: Secondary | ICD-10-CM | POA: Diagnosis not present

## 2017-11-23 HISTORY — DX: Cardiac arrhythmia, unspecified: I49.9

## 2017-11-23 HISTORY — DX: Urgency of urination: R39.15

## 2017-11-23 HISTORY — DX: Hypotension, unspecified: I95.9

## 2017-11-23 LAB — URINALYSIS, COMPLETE (UACMP) WITH MICROSCOPIC
BILIRUBIN URINE: NEGATIVE
Bacteria, UA: NONE SEEN
Glucose, UA: NEGATIVE mg/dL
Hgb urine dipstick: NEGATIVE
KETONES UR: 5 mg/dL — AB
Nitrite: NEGATIVE
Protein, ur: 30 mg/dL — AB
Specific Gravity, Urine: 1.026 (ref 1.005–1.030)
pH: 5 (ref 5.0–8.0)

## 2017-11-23 LAB — BASIC METABOLIC PANEL
Anion gap: 7 (ref 5–15)
BUN: 15 mg/dL (ref 6–20)
CALCIUM: 9.8 mg/dL (ref 8.9–10.3)
CO2: 34 mmol/L — AB (ref 22–32)
CREATININE: 0.55 mg/dL (ref 0.44–1.00)
Chloride: 100 mmol/L — ABNORMAL LOW (ref 101–111)
GFR calc non Af Amer: >60 mL/min (ref >60)
GLUCOSE: 144 mg/dL — AB (ref 65–99)
Potassium: 3.4 mmol/L — ABNORMAL LOW (ref 3.5–5.1)
Sodium: 141 mmol/L (ref 135–145)

## 2017-11-23 LAB — CBC
HEMATOCRIT: 38.1 % (ref 35.0–47.0)
Hemoglobin: 11.7 g/dL — ABNORMAL LOW (ref 12.0–16.0)
MCH: 26 pg (ref 26.0–34.0)
MCHC: 30.7 g/dL — AB (ref 32.0–36.0)
MCV: 84.7 fL (ref 80.0–100.0)
Platelets: 217 10*3/uL (ref 150–440)
RBC: 4.5 MIL/uL (ref 3.80–5.20)
RDW: 15.9 % — AB (ref 11.5–14.5)
WBC: 6.4 10*3/uL (ref 3.6–11.0)

## 2017-11-23 LAB — PROTIME-INR
INR: 0.96
Prothrombin Time: 12.7 seconds (ref 11.4–15.2)

## 2017-11-23 LAB — TYPE AND SCREEN
ABO/RH(D): B POS
Antibody Screen: NEGATIVE

## 2017-11-23 LAB — SURGICAL PCR SCREEN
MRSA, PCR: NEGATIVE
STAPHYLOCOCCUS AUREUS: NEGATIVE

## 2017-11-23 NOTE — Pre-Procedure Instructions (Signed)
EKG'S REVIEWED BY DR KEPHART AND OK TO PROCEED 

## 2017-11-23 NOTE — Patient Instructions (Addendum)
Your procedure is scheduled on: 12/01/17 Thurs Report to Same Day Surgery 2nd floor medical mall [facility] Entrance-take elevator on left to 2nd floor.  Check in with surgery information desk.) To find out your arrival time please call [phone] between 1PM - 3PM on 11/30/17 Wed  Remember: Instructions that are not followed completely may result in serious medical risk, up to and including death, or upon the discretion of your surgeon and anesthesiologist your surgery may need to be rescheduled.    _x___ 1. Do not eat food after midnight the night before your procedure. You may drink clear liquids up to 2 hours before you are scheduled to arrive at the hospital for your procedure.  Do not drink clear liquids within 2 hours of your scheduled arrival to the hospital.  Clear liquids include  --Water or Apple juice without pulp  --Clear carbohydrate beverage such as ClearFast or Gatorade  --Black Coffee or Clear Tea (No milk, no creamers, do not add anything to                  the coffee or Tea Type 1 and type 2 diabetics should only drink water.  No gum chewing or hard candies.     __x__ 2. No Alcohol for 24 hours before or after surgery.   __x__3. No Smoking or e-cigarettes for 24 prior to surgery.  Do not use any chewable tobacco products for at least 6 hour prior to surgery   ____  4. Bring all medications with you on the day of surgery if instructed.    __x__ 5. Notify your doctor if there is any change in your medical condition     (cold, fever, infections).    x___6. On the morning of surgery brush your teeth with toothpaste and water.  You may rinse your mouth with mouth wash if you wish.  Do not swallow any toothpaste or mouthwash.   Do not wear jewelry, make-up, hairpins, clips or nail polish.  Do not wear lotions, powders, or perfumes. You may wear deodorant.  Do not shave 48 hours prior to surgery. Men may shave face and neck.  Do not bring valuables to the hospital.     [facility] is not responsible for any belongings or valuables.               Contacts, dentures or bridgework may not be worn into surgery.  Leave your suitcase in the car. After surgery it may be brought to your room.  For patients admitted to the hospital, discharge time is determined by your                       treatment team.  _  Patients discharged the day of surgery will not be allowed to drive home.  You will need someone to drive you home and stay with you the night of your procedure.    Please read over the following fact sheets that you were given:   [facility] Preparing for Surgery and or MRSA Information   _x___ Take anti-hypertensive listed below, cardiac, seizure, asthma,     anti-reflux and psychiatric medicines. These include:  1. busPIRone (BUSPAR) 10 MG tablet  2.levothyroxine (SYNTHROID, LEVOTHROID) 75 MCG tablet  3.omeprazole (PRILOSEC) 40 MG capsule  4.  5.  6.  ____Fleets enema or Magnesium Citrate as directed.   _x___ Use CHG Soap or sage wipes as directed on instruction sheet   ____ Use inhalers  on the day of surgery and bring to hospital day of surgery  __x__ Stop Metformin and Janumet 2 days prior to surgery.    ____ Take 1/2 of usual insulin dose the night before surgery and none on the morning     surgery.   _x___ Follow recommendations from Cardiologist, Pulmonologist or PCP regarding          stopping Aspirin, Coumadin, Plavix ,Eliquis, Effient, or Pradaxa, and Pletal.  X____Stop Anti-inflammatories such as Advil, Aleve, Ibuprofen, Motrin, Naproxen, Naprosyn, Goodies powders or aspirin products. OK to take Tylenol and                          Celebrex.   _x___ Stop supplements until after surgery.  But may continue Vitamin D, Vitamin B,       and multivitamin.   ____ Bring C-Pap to the hospital.

## 2017-11-24 LAB — URINE CULTURE: CULTURE: NO GROWTH

## 2017-11-25 NOTE — Pre-Procedure Instructions (Signed)
Patient is allergic to Cephalexin.  Faxed note to Dr. Roland Rack asking if wanted another antibiotic?  Also asked, would you like a foley catheter inserted in the OR?

## 2017-12-01 ENCOUNTER — Encounter: Payer: Self-pay | Admitting: Anesthesiology

## 2017-12-01 ENCOUNTER — Inpatient Hospital Stay: Payer: Medicare Other | Admitting: Anesthesiology

## 2017-12-01 ENCOUNTER — Inpatient Hospital Stay
Admission: RE | Admit: 2017-12-01 | Discharge: 2017-12-04 | DRG: 469 | Disposition: A | Discharge status: Skilled Nursing Facility | Payer: Medicare Other | Source: Ambulatory Visit | Attending: Surgery | Admitting: Surgery

## 2017-12-01 ENCOUNTER — Other Ambulatory Visit: Payer: Self-pay

## 2017-12-01 ENCOUNTER — Inpatient Hospital Stay: Payer: Medicare Other

## 2017-12-01 ENCOUNTER — Encounter
Admission: RE | Disposition: A | Discharge status: Skilled Nursing Facility | Payer: Self-pay | Source: Ambulatory Visit | Attending: Surgery

## 2017-12-01 DIAGNOSIS — Z96641 Presence of right artificial hip joint: Secondary | ICD-10-CM | POA: Diagnosis present

## 2017-12-01 DIAGNOSIS — D62 Acute posthemorrhagic anemia: Secondary | ICD-10-CM | POA: Diagnosis not present

## 2017-12-01 DIAGNOSIS — Z7984 Long term (current) use of oral hypoglycemic drugs: Secondary | ICD-10-CM

## 2017-12-01 DIAGNOSIS — Z79899 Other long term (current) drug therapy: Secondary | ICD-10-CM

## 2017-12-01 DIAGNOSIS — E039 Hypothyroidism, unspecified: Secondary | ICD-10-CM | POA: Diagnosis present

## 2017-12-01 DIAGNOSIS — J9601 Acute respiratory failure with hypoxia: Secondary | ICD-10-CM | POA: Diagnosis not present

## 2017-12-01 DIAGNOSIS — J45909 Unspecified asthma, uncomplicated: Secondary | ICD-10-CM | POA: Diagnosis present

## 2017-12-01 DIAGNOSIS — Z66 Do not resuscitate: Secondary | ICD-10-CM | POA: Diagnosis present

## 2017-12-01 DIAGNOSIS — E119 Type 2 diabetes mellitus without complications: Secondary | ICD-10-CM | POA: Diagnosis present

## 2017-12-01 DIAGNOSIS — E7800 Pure hypercholesterolemia, unspecified: Secondary | ICD-10-CM | POA: Diagnosis present

## 2017-12-01 DIAGNOSIS — G4730 Sleep apnea, unspecified: Secondary | ICD-10-CM | POA: Diagnosis present

## 2017-12-01 DIAGNOSIS — Z90710 Acquired absence of both cervix and uterus: Secondary | ICD-10-CM

## 2017-12-01 DIAGNOSIS — K219 Gastro-esophageal reflux disease without esophagitis: Secondary | ICD-10-CM | POA: Diagnosis present

## 2017-12-01 DIAGNOSIS — I10 Essential (primary) hypertension: Secondary | ICD-10-CM | POA: Diagnosis present

## 2017-12-01 DIAGNOSIS — M1712 Unilateral primary osteoarthritis, left knee: Principal | ICD-10-CM | POA: Diagnosis present

## 2017-12-01 DIAGNOSIS — Z96652 Presence of left artificial knee joint: Secondary | ICD-10-CM

## 2017-12-01 DIAGNOSIS — Z79890 Hormone replacement therapy (postmenopausal): Secondary | ICD-10-CM

## 2017-12-01 DIAGNOSIS — E785 Hyperlipidemia, unspecified: Secondary | ICD-10-CM | POA: Diagnosis present

## 2017-12-01 DIAGNOSIS — Z7982 Long term (current) use of aspirin: Secondary | ICD-10-CM

## 2017-12-01 DIAGNOSIS — R0902 Hypoxemia: Secondary | ICD-10-CM

## 2017-12-01 HISTORY — PX: TOTAL KNEE ARTHROPLASTY: SHX125

## 2017-12-01 LAB — GLUCOSE, CAPILLARY
GLUCOSE-CAPILLARY: 129 mg/dL — AB (ref 65–99)
GLUCOSE-CAPILLARY: 136 mg/dL — AB (ref 65–99)
GLUCOSE-CAPILLARY: 153 mg/dL — AB (ref 65–99)
Glucose-Capillary: 227 mg/dL — ABNORMAL HIGH (ref 65–99)

## 2017-12-01 SURGERY — ARTHROPLASTY, KNEE, TOTAL
Anesthesia: Spinal | Laterality: Left

## 2017-12-01 MED ORDER — PRAVASTATIN SODIUM 20 MG PO TABS
20.0000 mg | ORAL_TABLET | ORAL | Status: DC
Start: 2017-12-02 — End: 2017-12-05
  Administered 2017-12-02 – 2017-12-04 (×2): 20 mg via ORAL
  Filled 2017-12-01 (×3): qty 1

## 2017-12-01 MED ORDER — PANTOPRAZOLE SODIUM 40 MG PO TBEC
40.0000 mg | DELAYED_RELEASE_TABLET | Freq: Two times a day (BID) | ORAL | Status: DC
Start: 2017-12-01 — End: 2017-12-05
  Administered 2017-12-01 – 2017-12-04 (×7): 40 mg via ORAL
  Filled 2017-12-01 (×7): qty 1

## 2017-12-01 MED ORDER — FAMOTIDINE 20 MG PO TABS
20.0000 mg | ORAL_TABLET | Freq: Once | ORAL | Status: AC
  Administered 2017-12-01: 20 mg via ORAL

## 2017-12-01 MED ORDER — BUPIVACAINE HCL (PF) 0.5 % IJ SOLN
INTRAMUSCULAR | Status: AC
  Filled 2017-12-01: qty 10

## 2017-12-01 MED ORDER — DEXAMETHASONE SODIUM PHOSPHATE 10 MG/ML IJ SOLN
INTRAMUSCULAR | Status: AC
  Filled 2017-12-01: qty 1

## 2017-12-01 MED ORDER — INSULIN ASPART 100 UNIT/ML ~~LOC~~ SOLN
0.0000 [IU] | Freq: Three times a day (TID) | SUBCUTANEOUS | Status: DC
  Administered 2017-12-01: 5 [IU] via SUBCUTANEOUS
  Administered 2017-12-03: 2 [IU] via SUBCUTANEOUS
  Administered 2017-12-03: 3 [IU] via SUBCUTANEOUS
  Administered 2017-12-04 (×2): 2 [IU] via SUBCUTANEOUS
  Filled 2017-12-01 (×5): qty 1

## 2017-12-01 MED ORDER — ACETAMINOPHEN 10 MG/ML IV SOLN
INTRAVENOUS | Status: AC
  Filled 2017-12-01: qty 100

## 2017-12-01 MED ORDER — ONDANSETRON HCL 4 MG/2ML IJ SOLN
INTRAMUSCULAR | Status: DC | PRN
  Administered 2017-12-01: 4 mg via INTRAVENOUS

## 2017-12-01 MED ORDER — DIPHENHYDRAMINE HCL 12.5 MG/5ML PO ELIX
12.5000 mg | ORAL_SOLUTION | ORAL | Status: DC | PRN
  Administered 2017-12-01 (×2): 25 mg via ORAL
  Filled 2017-12-01: qty 10

## 2017-12-01 MED ORDER — ASPIRIN 81 MG PO CHEW
81.0000 mg | CHEWABLE_TABLET | Freq: Every day | ORAL | Status: DC
  Administered 2017-12-01 – 2017-12-04 (×4): 81 mg via ORAL
  Filled 2017-12-01 (×4): qty 1

## 2017-12-01 MED ORDER — DOCUSATE SODIUM 100 MG PO CAPS
100.0000 mg | ORAL_CAPSULE | Freq: Two times a day (BID) | ORAL | Status: DC
  Administered 2017-12-01 – 2017-12-04 (×7): 100 mg via ORAL
  Filled 2017-12-01 (×7): qty 1

## 2017-12-01 MED ORDER — TRANEXAMIC ACID 1000 MG/10ML IV SOLN
INTRAVENOUS | Status: DC | PRN
  Administered 2017-12-01: 1000 mg via TOPICAL

## 2017-12-01 MED ORDER — TRAMADOL HCL 50 MG PO TABS
50.0000 mg | ORAL_TABLET | Freq: Four times a day (QID) | ORAL | Status: DC | PRN
  Administered 2017-12-01 – 2017-12-03 (×3): 50 mg via ORAL
  Filled 2017-12-01 (×5): qty 1

## 2017-12-01 MED ORDER — BUPIVACAINE LIPOSOME 1.3 % IJ SUSP
INTRAMUSCULAR | Status: DC | PRN
  Administered 2017-12-01: 60 mL

## 2017-12-01 MED ORDER — AMLODIPINE BESYLATE 10 MG PO TABS
10.0000 mg | ORAL_TABLET | Freq: Every day | ORAL | Status: DC
  Administered 2017-12-02: 10 mg via ORAL
  Filled 2017-12-01 (×2): qty 1

## 2017-12-01 MED ORDER — BUPIVACAINE-EPINEPHRINE (PF) 0.5% -1:200000 IJ SOLN
INTRAMUSCULAR | Status: DC | PRN
  Administered 2017-12-01: 30 mL

## 2017-12-01 MED ORDER — NEOMYCIN-POLYMYXIN B GU 40-200000 IR SOLN
Status: DC | PRN
  Administered 2017-12-01: 16 mL

## 2017-12-01 MED ORDER — NEOMYCIN-POLYMYXIN B GU 40-200000 IR SOLN
Status: AC
  Filled 2017-12-01: qty 20

## 2017-12-01 MED ORDER — CLINDAMYCIN PHOSPHATE 900 MG/50ML IV SOLN
900.0000 mg | Freq: Four times a day (QID) | INTRAVENOUS | Status: AC
  Administered 2017-12-01 – 2017-12-02 (×3): 900 mg via INTRAVENOUS
  Filled 2017-12-01 (×3): qty 50

## 2017-12-01 MED ORDER — LIDOCAINE HCL (PF) 2 % IJ SOLN
INTRAMUSCULAR | Status: AC
  Filled 2017-12-01: qty 10

## 2017-12-01 MED ORDER — VITAMIN E 180 MG (400 UNIT) PO CAPS
400.0000 [IU] | ORAL_CAPSULE | ORAL | Status: DC
Start: 2017-12-03 — End: 2017-12-05
  Administered 2017-12-04: 400 [IU] via ORAL
  Filled 2017-12-01 (×2): qty 1

## 2017-12-01 MED ORDER — METOCLOPRAMIDE HCL 10 MG PO TABS: 5.0000 mg | ORAL_TABLET | Freq: Three times a day (TID) | ORAL | Status: DC | PRN

## 2017-12-01 MED ORDER — ONDANSETRON HCL 4 MG/2ML IJ SOLN: 4.0000 mg | Freq: Four times a day (QID) | INTRAMUSCULAR | Status: DC | PRN

## 2017-12-01 MED ORDER — FUROSEMIDE 20 MG PO TABS: 20.0000 mg | ORAL_TABLET | Freq: Every day | ORAL | Status: DC | PRN

## 2017-12-01 MED ORDER — VITAMIN B-12 1000 MCG PO TABS
2500.0000 ug | ORAL_TABLET | Freq: Every day | ORAL | Status: DC
  Administered 2017-12-01 – 2017-12-04 (×4): 2500 ug via ORAL
  Filled 2017-12-01 (×3): qty 3

## 2017-12-01 MED ORDER — PROPOFOL 10 MG/ML IV BOLUS
INTRAVENOUS | Status: AC
  Filled 2017-12-01: qty 60

## 2017-12-01 MED ORDER — LEVOTHYROXINE SODIUM 50 MCG PO TABS
75.0000 ug | ORAL_TABLET | ORAL | Status: DC
  Administered 2017-12-02 – 2017-12-04 (×3): 75 ug via ORAL
  Filled 2017-12-01 (×3): qty 1

## 2017-12-01 MED ORDER — HYDRALAZINE HCL 20 MG/ML IJ SOLN
INTRAMUSCULAR | Status: AC
  Administered 2017-12-01: 5 mg via INTRAVENOUS
  Filled 2017-12-01: qty 1

## 2017-12-01 MED ORDER — BISACODYL 10 MG RE SUPP
10.0000 mg | Freq: Every day | RECTAL | Status: DC | PRN
  Administered 2017-12-04: 10 mg via RECTAL
  Filled 2017-12-01: qty 1

## 2017-12-01 MED ORDER — FLEET ENEMA 7-19 GM/118ML RE ENEM
1.0000 | ENEMA | Freq: Once | RECTAL | Status: AC | PRN
  Administered 2017-12-04: 1 via RECTAL

## 2017-12-01 MED ORDER — PROPOFOL 500 MG/50ML IV EMUL
INTRAVENOUS | Status: DC | PRN
  Administered 2017-12-01: 40 ug/kg/min via INTRAVENOUS

## 2017-12-01 MED ORDER — ACETAMINOPHEN 325 MG PO TABS: 325.0000 mg | ORAL_TABLET | Freq: Four times a day (QID) | ORAL | Status: DC | PRN

## 2017-12-01 MED ORDER — BUPIVACAINE HCL (PF) 0.5 % IJ SOLN
INTRAMUSCULAR | Status: DC | PRN
  Administered 2017-12-01: 2.5 mL

## 2017-12-01 MED ORDER — METFORMIN HCL ER 500 MG PO TB24
500.0000 mg | ORAL_TABLET | Freq: Every day | ORAL | Status: DC
  Administered 2017-12-02 – 2017-12-04 (×3): 500 mg via ORAL
  Filled 2017-12-01 (×4): qty 1

## 2017-12-01 MED ORDER — ENOXAPARIN SODIUM 40 MG/0.4ML ~~LOC~~ SOLN
40.0000 mg | SUBCUTANEOUS | Status: DC
  Administered 2017-12-02 – 2017-12-04 (×3): 40 mg via SUBCUTANEOUS
  Filled 2017-12-01 (×3): qty 0.4

## 2017-12-01 MED ORDER — HYDROMORPHONE HCL 1 MG/ML IJ SOLN
0.5000 mg | INTRAMUSCULAR | Status: DC | PRN
  Administered 2017-12-01: 0.5 mg via INTRAVENOUS
  Administered 2017-12-03: 1 mg via INTRAVENOUS
  Filled 2017-12-01 (×2): qty 1

## 2017-12-01 MED ORDER — ONDANSETRON HCL 4 MG PO TABS
4.0000 mg | ORAL_TABLET | Freq: Four times a day (QID) | ORAL | Status: DC | PRN
  Administered 2017-12-03: 4 mg via ORAL
  Filled 2017-12-01: qty 1

## 2017-12-01 MED ORDER — CLINDAMYCIN PHOSPHATE 900 MG/50ML IV SOLN
900.0000 mg | Freq: Once | INTRAVENOUS | Status: AC
  Administered 2017-12-01: 900 mg via INTRAVENOUS

## 2017-12-01 MED ORDER — METOCLOPRAMIDE HCL 5 MG/ML IJ SOLN: 5.0000 mg | Freq: Three times a day (TID) | INTRAMUSCULAR | Status: DC | PRN

## 2017-12-01 MED ORDER — SODIUM CHLORIDE 0.9 % IV SOLN
INTRAVENOUS | Status: DC
  Administered 2017-12-01 – 2017-12-02 (×2): via INTRAVENOUS

## 2017-12-01 MED ORDER — TRANEXAMIC ACID 1000 MG/10ML IV SOLN
INTRAVENOUS | Status: AC
  Filled 2017-12-01: qty 10

## 2017-12-01 MED ORDER — SODIUM CHLORIDE 0.9 % IJ SOLN
INTRAMUSCULAR | Status: AC
  Filled 2017-12-01: qty 50

## 2017-12-01 MED ORDER — FENTANYL CITRATE (PF) 100 MCG/2ML IJ SOLN: 25.0000 ug | INTRAMUSCULAR | Status: DC | PRN

## 2017-12-01 MED ORDER — LIDOCAINE HCL (CARDIAC) 20 MG/ML IV SOLN
INTRAVENOUS | Status: DC | PRN
  Administered 2017-12-01: 60 mg via INTRAVENOUS

## 2017-12-01 MED ORDER — BUPIVACAINE-EPINEPHRINE (PF) 0.5% -1:200000 IJ SOLN
INTRAMUSCULAR | Status: AC
  Filled 2017-12-01: qty 30

## 2017-12-01 MED ORDER — POLYVINYL ALCOHOL 1.4 % OP SOLN
1.0000 [drp] | Freq: Every day | OPHTHALMIC | Status: DC | PRN
Start: 2017-12-01 — End: 2017-12-05
  Filled 2017-12-01: qty 15

## 2017-12-01 MED ORDER — OXYCODONE HCL 5 MG/5ML PO SOLN: 5.0000 mg | Freq: Once | ORAL | Status: DC | PRN

## 2017-12-01 MED ORDER — PAROXETINE HCL 20 MG PO TABS
40.0000 mg | ORAL_TABLET | Freq: Every evening | ORAL | Status: DC
  Administered 2017-12-01 – 2017-12-04 (×4): 40 mg via ORAL
  Filled 2017-12-01 (×4): qty 2

## 2017-12-01 MED ORDER — CLINDAMYCIN PHOSPHATE 900 MG/50ML IV SOLN
INTRAVENOUS | Status: AC
  Filled 2017-12-01: qty 50

## 2017-12-01 MED ORDER — OXYCODONE HCL 5 MG PO TABS: 5.0000 mg | ORAL_TABLET | Freq: Once | ORAL | Status: DC | PRN

## 2017-12-01 MED ORDER — BUSPIRONE HCL 10 MG PO TABS
20.0000 mg | ORAL_TABLET | Freq: Two times a day (BID) | ORAL | Status: DC
  Administered 2017-12-01 – 2017-12-04 (×6): 20 mg via ORAL
  Filled 2017-12-01 (×6): qty 2

## 2017-12-01 MED ORDER — PROPOFOL 10 MG/ML IV BOLUS
INTRAVENOUS | Status: AC
  Filled 2017-12-01: qty 20

## 2017-12-01 MED ORDER — OXYCODONE HCL 5 MG PO TABS
5.0000 mg | ORAL_TABLET | ORAL | Status: DC | PRN
Start: 2017-12-01 — End: 2017-12-05
  Administered 2017-12-01 (×3): 5 mg via ORAL
  Administered 2017-12-02 – 2017-12-03 (×5): 10 mg via ORAL
  Administered 2017-12-03: 5 mg via ORAL
  Administered 2017-12-04: 10 mg via ORAL
  Filled 2017-12-01 (×2): qty 2
  Filled 2017-12-01: qty 1
  Filled 2017-12-01 (×2): qty 2
  Filled 2017-12-01: qty 1
  Filled 2017-12-01 (×2): qty 2
  Filled 2017-12-01: qty 1
  Filled 2017-12-01: qty 2

## 2017-12-01 MED ORDER — FAMOTIDINE 20 MG PO TABS
ORAL_TABLET | ORAL | Status: AC
  Filled 2017-12-01: qty 1

## 2017-12-01 MED ORDER — SODIUM CHLORIDE 0.9 % IV SOLN
INTRAVENOUS | Status: DC
  Administered 2017-12-01: 08:00:00 via INTRAVENOUS

## 2017-12-01 MED ORDER — PROPOFOL 10 MG/ML IV BOLUS
INTRAVENOUS | Status: DC | PRN
  Administered 2017-12-01: 21 mg via INTRAVENOUS
  Administered 2017-12-01: 50 mg via INTRAVENOUS
  Administered 2017-12-01: 21 mg via INTRAVENOUS

## 2017-12-01 MED ORDER — BUPIVACAINE LIPOSOME 1.3 % IJ SUSP
INTRAMUSCULAR | Status: AC
  Filled 2017-12-01: qty 20

## 2017-12-01 MED ORDER — HYDRALAZINE HCL 20 MG/ML IJ SOLN
5.0000 mg | Freq: Once | INTRAMUSCULAR | Status: AC
  Administered 2017-12-01: 5 mg via INTRAVENOUS

## 2017-12-01 MED ORDER — MAGNESIUM HYDROXIDE 400 MG/5ML PO SUSP
30.0000 mL | Freq: Every day | ORAL | Status: DC | PRN
  Administered 2017-12-03 – 2017-12-04 (×2): 30 mL via ORAL
  Filled 2017-12-01 (×2): qty 30

## 2017-12-01 MED ORDER — ACETAMINOPHEN 500 MG PO TABS
1000.0000 mg | ORAL_TABLET | Freq: Four times a day (QID) | ORAL | Status: AC
  Administered 2017-12-01 – 2017-12-02 (×4): 1000 mg via ORAL
  Filled 2017-12-01 (×4): qty 2

## 2017-12-01 MED ORDER — ONDANSETRON HCL 4 MG/2ML IJ SOLN
INTRAMUSCULAR | Status: AC
  Filled 2017-12-01: qty 2

## 2017-12-01 MED ORDER — DEXAMETHASONE SODIUM PHOSPHATE 10 MG/ML IJ SOLN
INTRAMUSCULAR | Status: DC | PRN
  Administered 2017-12-01: 5 mg via INTRAVENOUS

## 2017-12-01 MED ORDER — ACETAMINOPHEN 10 MG/ML IV SOLN
INTRAVENOUS | Status: DC | PRN
  Administered 2017-12-01: 1000 mg via INTRAVENOUS

## 2017-12-01 MED ORDER — LOSARTAN POTASSIUM 50 MG PO TABS
100.0000 mg | ORAL_TABLET | Freq: Every day | ORAL | Status: DC
  Administered 2017-12-02 – 2017-12-04 (×3): 100 mg via ORAL
  Filled 2017-12-01 (×3): qty 2

## 2017-12-01 SURGICAL SUPPLY — 69 items
BANDAGE ELASTIC 6 LF NS (GAUZE/BANDAGES/DRESSINGS) ×3 IMPLANT
BAR LOCKING TIBIAL (Orthopedic Implant) IMPLANT
BAR ORTH LCK STRL TIB TI VNGRD (Orthopedic Implant) ×1 IMPLANT
BEARING TIBIAL CR 12 63/77 (Joint) ×1 IMPLANT
BEARING TIBIAL CR 12MM 63/77MM (Joint) ×1 IMPLANT
BEARING TIBIAL KNEE STRL 12X67 (Knees) ×2 IMPLANT
BLADE SAW SAG 25X90X1.19 (BLADE) ×3 IMPLANT
BLADE SURG SZ20 CARB STEEL (BLADE) ×3 IMPLANT
BNDG CMPR MED 5X6 ELC HKLP NS (GAUZE/BANDAGES/DRESSINGS) ×1
BRNG TIB 63/67X12 CRCTE (Joint) ×1 IMPLANT
BRNG TIB 67X12 ANT STAB KN (Knees) ×1 IMPLANT
CANISTER SUCT 1200ML W/VALVE (MISCELLANEOUS) ×3 IMPLANT
CANISTER SUCT 3000ML PPV (MISCELLANEOUS) ×3 IMPLANT
CEMENT BONE R 1X40 (Cement) ×6 IMPLANT
CEMENT VACUUM MIXING SYSTEM (MISCELLANEOUS) ×3 IMPLANT
CHLORAPREP W/TINT 26ML (MISCELLANEOUS) ×3 IMPLANT
COMP FEMORAL CRUC LEFT 62.5MM (Joint) ×3 IMPLANT
COMPONENT FEMRL CRUC LT 62.5MM (Joint) IMPLANT
COOLER POLAR GLACIER W/PUMP (MISCELLANEOUS) ×3 IMPLANT
COVER MAYO STAND STRL (DRAPES) ×3 IMPLANT
CUFF TOURN 24 STER (MISCELLANEOUS) IMPLANT
CUFF TOURN 30 STER DUAL PORT (MISCELLANEOUS) ×2 IMPLANT
DRAPE IMP U-DRAPE 54X76 (DRAPES) ×3 IMPLANT
DRAPE INCISE IOBAN 66X45 STRL (DRAPES) ×3 IMPLANT
DRAPE SHEET LG 3/4 BI-LAMINATE (DRAPES) ×3 IMPLANT
DRSG OPSITE POSTOP 4X12 (GAUZE/BANDAGES/DRESSINGS) ×3 IMPLANT
DRSG OPSITE POSTOP 4X14 (GAUZE/BANDAGES/DRESSINGS) ×1 IMPLANT
ELECT CAUTERY BLADE 6.4 (BLADE) ×3 IMPLANT
ELECT REM PT RETURN 9FT ADLT (ELECTROSURGICAL) ×3
ELECTRODE REM PT RTRN 9FT ADLT (ELECTROSURGICAL) ×1 IMPLANT
GLOVE BIO SURGEON STRL SZ7.5 (GLOVE) ×12 IMPLANT
GLOVE BIO SURGEON STRL SZ8 (GLOVE) ×12 IMPLANT
GLOVE BIOGEL PI IND STRL 8 (GLOVE) ×1 IMPLANT
GLOVE BIOGEL PI INDICATOR 8 (GLOVE) ×2
GLOVE INDICATOR 8.0 STRL GRN (GLOVE) ×3 IMPLANT
GOWN STRL REUS W/ TWL LRG LVL3 (GOWN DISPOSABLE) ×1 IMPLANT
GOWN STRL REUS W/ TWL XL LVL3 (GOWN DISPOSABLE) ×1 IMPLANT
GOWN STRL REUS W/TWL LRG LVL3 (GOWN DISPOSABLE) ×3
GOWN STRL REUS W/TWL XL LVL3 (GOWN DISPOSABLE) ×3
HOLDER FOLEY CATH W/STRAP (MISCELLANEOUS) ×3 IMPLANT
HOOD PEEL AWAY FLYTE STAYCOOL (MISCELLANEOUS) ×9 IMPLANT
IMMBOLIZER KNEE 19 BLUE UNIV (SOFTGOODS) ×3 IMPLANT
KIT TURNOVER KIT A (KITS) ×3 IMPLANT
NDL SAFETY ECLIPSE 18X1.5 (NEEDLE) ×2 IMPLANT
NDL SPNL 20GX3.5 QUINCKE YW (NEEDLE) ×1 IMPLANT
NEEDLE HYPO 18GX1.5 SHARP (NEEDLE) ×6
NEEDLE SPNL 20GX3.5 QUINCKE YW (NEEDLE) ×3 IMPLANT
NS IRRIG 1000ML POUR BTL (IV SOLUTION) ×3 IMPLANT
PACK TOTAL KNEE (MISCELLANEOUS) ×3 IMPLANT
PAD WRAPON POLAR KNEE (MISCELLANEOUS) ×1 IMPLANT
PATELLA SERIES A (Orthopedic Implant) ×2 IMPLANT
PLATE INTERLOK 6700 (Plate) ×2 IMPLANT
PULSAVAC PLUS IRRIG FAN TIP (DISPOSABLE) ×3
SOL .9 NS 3000ML IRR  AL (IV SOLUTION) ×2
SOL .9 NS 3000ML IRR AL (IV SOLUTION) ×1
SOL .9 NS 3000ML IRR UROMATIC (IV SOLUTION) ×1 IMPLANT
STAPLER SKIN PROX 35W (STAPLE) ×3 IMPLANT
SUCTION FRAZIER HANDLE 10FR (MISCELLANEOUS) ×2
SUCTION TUBE FRAZIER 10FR DISP (MISCELLANEOUS) ×1 IMPLANT
SUT VIC AB 0 CT1 36 (SUTURE) ×9 IMPLANT
SUT VIC AB 2-0 CT1 27 (SUTURE) ×12
SUT VIC AB 2-0 CT1 TAPERPNT 27 (SUTURE) ×3 IMPLANT
SYR 10ML LL (SYRINGE) ×3 IMPLANT
SYR 20CC LL (SYRINGE) ×3 IMPLANT
SYR 30ML LL (SYRINGE) ×9 IMPLANT
TIBIAL LOCKING BAR (Orthopedic Implant) ×3 IMPLANT
TIP FAN IRRIG PULSAVAC PLUS (DISPOSABLE) ×1 IMPLANT
TRAY FOLEY W/METER SILVER 16FR (SET/KITS/TRAYS/PACK) ×3 IMPLANT
WRAPON POLAR PAD KNEE (MISCELLANEOUS) ×3

## 2017-12-01 NOTE — Progress Notes (Signed)
Physical Therapy Evaluation Patient Details Name: NALIA HONEYCUTT MRN: [id number] DOB: 01/28/1935 Today's Date: 12/01/2017   History of Present Illness  Pt underwent L TKA with significant post-op pain. PT evaluation performed on POD#0. PMH includes anxiety, depression, HTN, GERD, R THA and R TKA.   Clinical Impression  Pt admitted with above diagnosis. Pt currently with functional limitations due to the deficits listed below (see PT Problem List).  Pt is modified independent for bed mobility with increased time required as well as use of bed rails and HOB elevated. Pt reports dizziness once sitting upright at EOB. Vitals obtained and are WNL. Pt also reporting persistent nausea and states that she is unable to transfer to recliner at this time. She is able to complete all supine exercises as instructed but reports nausea. She starts to heave however no emesis. Will complete full out of bed assessment at next treatment session as pt is able. She is likely appropriate to return home with [facility] PT and intermittent assist from her family. Pt will benefit from PT services to address deficits in strength, balance, and mobility in order to return to full function at home.     Follow Up Recommendations Home health PT;Supervision - Intermittent    Equipment Recommendations  3in1 (PT)    Recommendations for Other Services       Precautions / Restrictions Precautions Precautions: Knee Precaution Booklet Issued: Yes (comment) Restrictions Weight Bearing Restrictions: Yes LLE Weight Bearing: Weight bearing as tolerated      Mobility  Bed Mobility Overal bed mobility: Modified Independent             General bed mobility comments: Increased time. HOB elevated and use of bed rails. Pt reports dizziness once sitting upright at EOB. Vitals obtained and are WNL. Pt also reporting persistent nausea and states that she is unable to transfer to recliner at this time  Transfers                  General transfer comment: Deferred transfers and ambulation due to dizziness, nausea, and pain.   Ambulation/Gait                Stairs            Wheelchair Mobility    Modified Rankin (Stroke Patients Only)       Balance Overall balance assessment: Needs assistance Sitting-balance support: No upper extremity supported Sitting balance-Leahy Scale: Good                                       Pertinent Vitals/Pain Pain Assessment: 0-10 Pain Score: 8  Pain Location: L knee Pain Descriptors / Indicators: Operative site guarding Pain Intervention(s): Premedicated before session;Monitored during session;Limited activity within patient's tolerance    Home Living Family/patient expects to be discharged to:: Private residence Living Arrangements: Children Available Help at Discharge: Family;Available PRN/intermittently Type of Home: House Home Access: Stairs to enter Entrance Stairs-Rails: Can reach both Entrance Stairs-Number of Steps: 5 Home Layout: One level Home Equipment: Walker - 2 wheels;Cane - single point;Shower seat - built in      Prior Function Level of Independence: Independent         Comments: Pt reports independence with ADLs/IADLs     Hand Dominance   Dominant Hand: Right    Extremity/Trunk Assessment   Upper Extremity Assessment Upper Extremity Assessment: Overall WFL for tasks assessed  Lower Extremity Assessment Lower Extremity Assessment: LLE deficits/detail LLE Deficits / Details: Pt reports intact sensation in LLE. Full DF/PF. Able to perform SLR and SAQ without assistance       Communication   Communication: No difficulties  Cognition Arousal/Alertness: Awake/alert Behavior During Therapy: WFL for tasks assessed/performed Overall Cognitive Status: Within Functional Limits for tasks assessed                                        General Comments      Exercises Total Joint  Exercises Ankle Circles/Pumps: Both;10 reps Quad Sets: Both;10 reps Gluteal Sets: Both;10 reps Towel Squeeze: Both;10 reps Short Arc Quad: Left;10 reps Heel Slides: Left;10 reps Hip ABduction/ADduction: Left;10 reps Straight Leg Raises: Left;10 reps Goniometric ROM: -13 extension, unable to get flexion measurement in sitting due to dizziness, nausea, and pain. By gross visual assessment looks to be at least 60 degrees   Assessment/Plan    PT Assessment Patient needs continued PT services  PT Problem List Decreased strength;Decreased range of motion;Decreased activity tolerance;Decreased mobility;Pain       PT Treatment Interventions DME instruction;Gait training;Stair training;Functional mobility training;Therapeutic activities;Therapeutic exercise;Balance training;Neuromuscular re-education;Patient/family education;Manual techniques    PT Goals (Current goals can be found in the Care Plan section)  Acute Rehab PT Goals Patient Stated Goal: Return to prior level of function at home PT Goal Formulation: With patient/family Time For Goal Achievement: 12/15/17 Potential to Achieve Goals: Good    Frequency BID   Barriers to discharge        Co-evaluation               AM-PAC PT "6 Clicks" Daily Activity  Outcome Measure Difficulty turning over in bed (including adjusting bedclothes, sheets and blankets)?: A Little Difficulty moving from lying on back to sitting on the side of the bed? : A Little Difficulty sitting down on and standing up from a chair with arms (e.g., wheelchair, bedside commode, etc,.)?: Unable Help needed moving to and from a bed to chair (including a wheelchair)?: Total Help needed walking in hospital room?: Total Help needed climbing 3-5 steps with a railing? : Total 6 Click Score: 10    End of Session   Activity Tolerance: Patient limited by pain;Other (comment)(Limited by dizziness and nausea) Patient left: in bed;with call bell/phone within  reach;with bed alarm set;with family/visitor present;with SCD's reapplied Nurse Communication: Mobility status;Other (comment)(Nausea) PT Visit Diagnosis: Muscle weakness (generalized) (M62.81);Difficulty in walking, not elsewhere classified (R26.2);Pain Pain - Right/Left: Left Pain - part of body: Knee    Time: 1355-1429 PT Time Calculation (min) (ACUTE ONLY): 34 min   Charges:   PT Evaluation $PT Eval Low Complexity: 1 Low PT Treatments $Therapeutic Exercise: 8-22 mins   PT G Codes:        Lyndel Safe Shiron Whetsel PT, DPT    Rex Oesterle 12/01/2017, 3:33 PM

## 2017-12-01 NOTE — H&P (Signed)
Paper H&P to be scanned into permanent record. H&P reviewed and patient re-examined. No changes. 

## 2017-12-01 NOTE — Anesthesia Post-op Follow-up Note (Signed)
Anesthesia QCDR form completed.        

## 2017-12-01 NOTE — Anesthesia Procedure Notes (Signed)
Spinal  Patient location during procedure: OR Start time: 12/01/2017 8:54 AM End time: 12/01/2017 8:59 AM Staffing Anesthesiologist: Piscitello, Precious Haws, MD Resident/CRNA: Eben Burow, CRNA Performed: resident/CRNA  Preanesthetic Checklist Completed: patient identified, site marked, surgical consent, pre-op evaluation, timeout performed, IV checked, risks and benefits discussed and monitors and equipment checked Spinal Block Patient position: sitting Prep: Betadine Patient monitoring: heart rate, continuous pulse ox and blood pressure Approach: midline Location: L3-4 Injection technique: single-shot Needle Needle type: Pencan  Needle gauge: 24 G Needle length: 10 cm Assessment Sensory level: T8

## 2017-12-01 NOTE — NC FL2 (Signed)
[place] LEVEL OF CARE SCREENING TOOL     IDENTIFICATION  Patient Name: Mary [name] Birthdate: 01-15-1935 Sex: female Admission Date (Current Location): 12/01/2017  [place] and [place] Number:  Engineering geologist and Address:  [facility], [address] [place], [place] [address]      Provider Number: [id number]  Attending Physician Name and Address:  Corky Mull, MD  Relative Name and Phone Number:       Current Level of Care: Hospital Recommended Level of Care: [place] Prior Approval Number:    Date Approved/Denied:   PASRR Number: ([id number])  Discharge Plan: SNF    Current Diagnoses: Patient Active Problem List   Diagnosis Date Noted  . Status post total knee replacement using cement, left 12/01/2017  . Hypertensive urgency 05/19/2017  . Anxiety 05/19/2016  . Insomnia 05/19/2016  . Pure hypercholesterolemia 05/19/2016  . Personal history of disease 03/11/2016  . Status post hip hemiarthroplasty 10/10/2015  . Hypoxia 09/26/2015  . Hypoxemia 09/26/2015  . Fracture of femoral neck, right ([place]) 09/23/2015  . Type 2 diabetes mellitus ([place]) 09/23/2015  . HTN (hypertension) 09/23/2015  . HLD (hyperlipidemia) 09/23/2015  . GERD (gastroesophageal reflux disease) 09/23/2015  . Depression 09/23/2015  . Hypothyroidism 09/23/2015  . DDD (degenerative disc disease), lumbar 08/26/2015  . Lumbar stenosis with neurogenic claudication 08/26/2015  . Nocturia 04/29/2015  . Urinary frequency 04/29/2015  . Atrophic vaginitis 04/29/2015  . Mixed incontinence 06/28/2014  . Lumbar radiculopathy 02/08/2013    Orientation RESPIRATION BLADDER Height & Weight     Self, Time, Situation, Place  O2(2L Nasal Cannula) Continent Weight:   Height:     BEHAVIORAL SYMPTOMS/MOOD NEUROLOGICAL BOWEL NUTRITION STATUS      Continent Diet  AMBULATORY STATUS COMMUNICATION OF NEEDS Skin   Extensive Assist Verbally Surgical  wounds(Incision Left Knee)                       Personal Care Assistance Level of Assistance  Bathing, Feeding, Dressing Bathing Assistance: Limited assistance Feeding assistance: Independent Dressing Assistance: Limited assistance     Functional Limitations Info  Sight, Hearing, Speech Sight Info: Adequate Hearing Info: Adequate Speech Info: Adequate    SPECIAL CARE FACTORS FREQUENCY  PT (By licensed PT), OT (By licensed OT)     PT Frequency: (5) OT Frequency: (5)            Contractures      Additional Factors Info  Code Status, Allergies Code Status Info: (Full Code) Allergies Info: (CEPHALEXIN, NITROFURANTOIN, SULFA ANTIBIOTICS, ATORVASTATIN )           Current Medications (12/01/2017):  This is the current hospital active medication list Current Facility-Administered Medications  Medication Dose Route Frequency Provider Last Rate Last Dose  . 0.9 %  sodium chloride infusion   Intravenous Continuous Gunnar Fusi, MD      . famotidine (PEPCID) 20 MG tablet           . fentaNYL (SUBLIMAZE) injection 25-50 mcg  25-50 mcg Intravenous Q5 min PRN Piscitello, Precious Haws, MD      . hydrALAZINE (APRESOLINE) injection 5 mg  5 mg Intravenous Once Piscitello, Precious Haws, MD      . oxyCODONE (Oxy IR/ROXICODONE) immediate release tablet 5 mg  5 mg Oral Once PRN Piscitello, Precious Haws, MD       Or  . oxyCODONE (ROXICODONE) 5 MG/5ML solution 5 mg  5 mg Oral Once  PRN Piscitello, Precious Haws, MD         Discharge Medications: Please see discharge summary for a list of discharge medications.  Relevant Imaging Results:  Relevant Lab Results:   Additional Information (SSN: [id number])  Smith Mince, Student-Social Work

## 2017-12-01 NOTE — Transfer of Care (Signed)
Immediate Anesthesia Transfer of Care Note  Patient: Mary [name]  Procedure(s) Performed: TOTAL KNEE ARTHROPLASTY (Left )  Patient Location: PACU  Anesthesia Type:Spinal  Level of Consciousness: awake, alert , oriented and patient cooperative  Airway & Oxygen Therapy: Patient Spontanous Breathing and Patient connected to nasal cannula oxygen  Post-op Assessment: Report given to RN and Post -op Vital signs reviewed and stable  Post vital signs: Reviewed and stable  Last Vitals:  Vitals Value Taken Time  BP 160/75 12/01/2017 11:31 AM  Temp 36.7 C 12/01/2017 11:30 AM  Pulse 70 12/01/2017 11:32 AM  Resp 17 12/01/2017 11:32 AM  SpO2 97 % 12/01/2017 11:32 AM  Vitals shown include unvalidated device data.  Last Pain: There were no vitals filed for this visit.       Complications: No apparent anesthesia complications

## 2017-12-01 NOTE — Anesthesia Preprocedure Evaluation (Addendum)
Anesthesia Evaluation  Patient identified by MRN, date of birth, ID band Patient awake    Reviewed: Allergy & Precautions, H&P , NPO status , Patient's Chart, lab work & pertinent test results  History of Anesthesia Complications Negative for: history of anesthetic complications  Airway Mallampati: III  TM Distance: <3 FB Neck ROM: limited    Dental  (+) Chipped, Poor Dentition, Missing, Loose   Pulmonary shortness of breath and with exertion, asthma , sleep apnea ,           Cardiovascular Exercise Tolerance: Good hypertension, (-) angina(-) Past MI + dysrhythmias + Valvular Problems/Murmurs      Neuro/Psych PSYCHIATRIC DISORDERS Anxiety Depression  Neuromuscular disease    GI/Hepatic negative GI ROS, Neg liver ROS, GERD  Medicated and Controlled,  Endo/Other  diabetes, Type 2Hypothyroidism   Renal/GU      Musculoskeletal  (+) Arthritis ,   Abdominal   Peds  Hematology negative hematology ROS (+)   Anesthesia Other Findings Back Pain  Past Medical History: No date: Acid reflux No date: Arthritis No date: Asthma No date: Depression No date: Diabetes (HCC) No date: Dysrhythmia No date: Heart murmur No date: HLD (hyperlipidemia) No date: HTN (hypertension) No date: Hypotension     Comment:  when get up too quickly No date: Sleep apnea No date: Urinary urgency  Past Surgical History: No date: ABDOMINAL HYSTERECTOMY No date: CYSTOSCOPY 09/24/2015: HIP ARTHROPLASTY; Right     Comment:  Procedure: ARTHROPLASTY BIPOLAR HIP (HEMIARTHROPLASTY);               Surgeon: Corky Mull, MD;  Location: [facility] ORS;  Service:              Orthopedics;  Laterality: Right; No date: KNEE ARTHROSCOPY W/ AUTOGENOUS CARTILAGE IMPLANTATION (ACI)  PROCEDURE No date: VARICOSE VEIN SURGERY No date: VEIN SURGERY     Reproductive/Obstetrics negative OB ROS                            Anesthesia  Physical Anesthesia Plan  ASA: III  Anesthesia Plan: Spinal   Post-op Pain Management:    Induction:   PONV Risk Score and Plan:   Airway Management Planned: Natural Airway and Nasal Cannula  Additional Equipment:   Intra-op Plan:   Post-operative Plan:   Informed Consent: I have reviewed the patients History and Physical, chart, labs and discussed the procedure including the risks, benefits and alternatives for the proposed anesthesia with the patient or authorized representative who has indicated his/her understanding and acceptance.   Dental Advisory Given  Plan Discussed with: Anesthesiologist, CRNA and Surgeon  Anesthesia Plan Comments: (Patient reports no bleeding problems and no anticoagulant use.  Plan for spinal with backup GA  Patient consented for risks of anesthesia including but not limited to:  - adverse reactions to medications - risk of bleeding, infection, nerve damage and headache - risk of failed spinal - damage to teeth, lips or other oral mucosa - sore throat or hoarseness - Damage to heart, brain, lungs or loss of life  Patient voiced understanding.)        Anesthesia Quick Evaluation

## 2017-12-01 NOTE — Op Note (Signed)
12/01/2017  11:44 AM  Patient:   Mary [name]  Pre-Op Diagnosis:   Degenerative joint disease, left knee.  Post-Op Diagnosis:   Same  Procedure:   Left TKA using all-cemented [employer] Vanguard system with a 62.5 mm PCR femur, a 67 mm tibial tray with a 12 mm AS E-poly insert, and a 31 x 8 mm all-poly 3-pegged domed patella.  Surgeon:   Pascal Lux, MD  Assistant:   Arvilla Meres, RNFA  Anesthesia:   Spinal  Findings:   As above  Complications:   None  EBL:   10 cc  Fluids:   800 cc crystalloid  UOP:   550 cc  TT:   110 minutes at 300 mmHg  Drains:   None  Closure:   Staples  Implants:   As above  Brief Clinical Note:   The patient is an 83 year old female with a long history of progressively worsening left knee pain. The patient's symptoms have progressed despite medications, activity modification, injections, etc. The patient's history and examination were consistent with advanced degenerative joint disease of the right knee confirmed by plain radiographs. The patient presents at this time for a left total knee arthroplasty.  Procedure:   The patient was brought into the operating room. After adequate spinal anesthesia was obtained, the patient was lain in the supine position. A Foley catheter was placed by the nurse before the right lower extremity was prepped with ChloraPrep solution and draped sterilely. Preoperative antibiotics were administered. After verifying the proper laterality with a surgical timeout, the limb was exsanguinated with an Esmarch and the tourniquet inflated to 300 mmHg. A standard anterior approach to the knee was made through an approximately 7 inch incision. The incision was carried down through the subcutaneous tissues to expose superficial retinaculum. This was split the length of the incision and the medial flap elevated sufficiently to expose the medial retinaculum. The medial retinaculum was incised, leaving a 3-4 mm cuff of tissue on the  patella. This was extended distally along the medial border of the patellar tendon and proximally through the medial third of the quadriceps tendon. A subtotal fat pad excision was performed before the soft tissues were elevated off the anteromedial and anterolateral aspects of the proximal tibia to the level of the collateral ligaments. The anterior portions of the medial and lateral menisci were removed, as was the anterior cruciate ligament. With the knee flexed to 90, the external tibial guide was positioned and the appropriate proximal tibial cut made. This piece was taken to the back table where it was measured and found to be optimally replicated by a 67 mm component.  Attention was directed to the distal femur. The intramedullary canal was accessed through a 3/8" drill hole. The intramedullary guide was inserted and position in order to obtain a neutral flexion gap. The intercondylar block was positioned with care taken to avoid notching the anterior cortex of the femur. The appropriate cut was made. Next, the distal cutting block was placed at 6 of valgus alignment. Using the 9 mm slot, the distal cut was made. The distal femur was measured and found to be optimally replicated by the 17.5 mm component. The 62.5 mm 4-in-1 cutting block was positioned and first the posterior, then the posterior chamfer, the anterior chamfer, femoral and intercondylar cuts were made. At this point, the posterior portions medial and lateral menisci were removed. A trial reduction was performed using the appropriate femoral and tibial components with the 10 mm  and 12 mm inserts. The 12 mm insert demonstrated excellent stability to varus and valgus stressing both in flexion and extension while permitting full extension. Patella tracking was assessed and found to be excellent. Therefore, the tibial guide position was marked on the proximal tibia. The patella thickness was measured and found to be 21 mm. Therefore, the  appropriate cut was made. The patellar surface was measured and found to be optimally replicated by the 31 mm component. The three peg holes were drilled in place before the trial button was inserted. Patella tracking was assessed and found to be excellent, passing the "no thumb test". The lug holes were drilled into the distal femur before the trial component was removed, leaving only the tibial tray. The keel was then created using the appropriate tower, reamer, and punch.  The bony surfaces were prepared for cementing by irrigating thoroughly with bacitracin saline solution. A bone plug was fashioned from some of the bone that had been removed previously and used to plug the distal femoral canal. In addition, 20 cc of Exparel diluted out to 60 cc with normal saline and 30 cc of 0.5% Sensorcaine were injected into the postero-medial and postero-lateral aspects of the knee, the medial and lateral gutter regions, and the peri-incisional tissues to help with postoperative analgesia. Meanwhile, the cement was being mixed on the back table. When it was ready, the tibial tray was cemented in first. The excess cement was removed using [employer] fast streamer. Next, the femoral component was impacted into place. Again, the excess cement was removed using [employer] fast streamer. The 12 mm trial insert was positioned and the knee brought into extension while the cement hardened. Finally, the patella was cemented into place and secured using the patellar clamp. Again, the excess cement was removed using [employer] fast streamer. Once the cement had hardened, the knee was placed through a range of motion with the findings as described above. Therefore, the trial insert was removed and, after verifying that no cement had been retained posteriorly, the permanent insert was positioned and secured using the appropriate key locking mechanism. The knee was placed through range of motion but was noted to have moderate mid flexion instability. Therefore,  it was elected to switch to the 12 mm anterior stabilized insert. This was positioned and secured using the appropriate key locking mechanism. Again the knee was placed through a range of motion with the findings as described above.  The wound was copiously irrigated with bacitracin saline solution using the jet lavage system before the quadriceps tendon and retinacular layer were reapproximated using #0 Vicryl interrupted sutures. The superficial retinacular layer also was closed using a running #0 Vicryl suture. A total of 10 cc of transexemic acid (TXA) was injected intra-articularly before the subcutaneous tissues were closed in several layers using 2-0 Vicryl interrupted sutures. The skin was closed using staples. A sterile honeycomb dressing was applied to the skin before the leg was wrapped with an Ace wrap to accommodate the polar pack. The patient was then awakened and returned to the recovery room in satisfactory condition after tolerating the procedure well.

## 2017-12-02 LAB — GLUCOSE, CAPILLARY
GLUCOSE-CAPILLARY: 112 mg/dL — AB (ref 65–99)
GLUCOSE-CAPILLARY: 140 mg/dL — AB (ref 65–99)
Glucose-Capillary: 118 mg/dL — ABNORMAL HIGH (ref 65–99)

## 2017-12-02 LAB — CBC WITH DIFFERENTIAL/PLATELET
BASOS ABS: 0 10*3/uL (ref 0–0.1)
Basophils Relative: 0 %
Eosinophils Absolute: 0 10*3/uL (ref 0–0.7)
Eosinophils Relative: 0 %
HEMATOCRIT: 32.6 % — AB (ref 35.0–47.0)
Hemoglobin: 10.3 g/dL — ABNORMAL LOW (ref 12.0–16.0)
LYMPHS ABS: 0.9 10*3/uL — AB (ref 1.0–3.6)
LYMPHS PCT: 9 %
MCH: 26.9 pg (ref 26.0–34.0)
MCHC: 31.5 g/dL — AB (ref 32.0–36.0)
MCV: 85.5 fL (ref 80.0–100.0)
MONOS PCT: 7 %
Monocytes Absolute: 0.8 10*3/uL (ref 0.2–0.9)
NEUTROS ABS: 8.7 10*3/uL — AB (ref 1.4–6.5)
Neutrophils Relative %: 84 %
Platelets: 186 10*3/uL (ref 150–440)
RBC: 3.81 MIL/uL (ref 3.80–5.20)
RDW: 16.5 % — AB (ref 11.5–14.5)
WBC: 10.4 10*3/uL (ref 3.6–11.0)

## 2017-12-02 LAB — BASIC METABOLIC PANEL
ANION GAP: 6 (ref 5–15)
BUN: 10 mg/dL (ref 6–20)
CALCIUM: 8.8 mg/dL — AB (ref 8.9–10.3)
CO2: 31 mmol/L (ref 22–32)
Chloride: 100 mmol/L — ABNORMAL LOW (ref 101–111)
Creatinine, Ser: 0.46 mg/dL (ref 0.44–1.00)
GFR calc Af Amer: >60 mL/min (ref >60)
GFR calc non Af Amer: >60 mL/min (ref >60)
GLUCOSE: 155 mg/dL — AB (ref 65–99)
Potassium: 4.2 mmol/L (ref 3.5–5.1)
Sodium: 137 mmol/L (ref 135–145)

## 2017-12-02 MED ORDER — HYDRALAZINE HCL 10 MG PO TABS
10.0000 mg | ORAL_TABLET | Freq: Four times a day (QID) | ORAL | Status: DC | PRN
  Administered 2017-12-02 – 2017-12-03 (×2): 10 mg via ORAL
  Filled 2017-12-02 (×3): qty 1

## 2017-12-02 NOTE — Progress Notes (Signed)
Physical Therapy Treatment Patient Details Name: AMI MALLY MRN: [id number] DOB: [birth year] Today's Date: 12/02/2017    History of Present Illness Pt underwent L TKA with significant post-op pain. PT evaluation performed on POD#0. PMH includes anxiety, depression, HTN, GERD, R THA and R TKA.     PT Comments    Pt making good progress with therapy today. She is modified independent for bed mobility. Pt requires minA+1 to come to standing. Once upright she is steady and stable with UE support on rolling walker. Pt initially with very slow ambulation but gradually improves with distance. She is able to ambulate to door and back to bed with rolling walker. Cues for upright posture, turns, and proper sequencing with walker. No external support required by therapist to stabilize. Pt removed from supplemental O2 and SaO2 drops to 85% on room air. O2 reapplied and SaO2 remains >90% on 2L/min. Will continue to assess discharge disposition during PM session. May need to update. Pt will benefit from PT services to address deficits in strength, balance, and mobility in order to return to full function at home.    Follow Up Recommendations  Home health PT;Supervision - Intermittent     Equipment Recommendations  3in1 (PT)    Recommendations for Other Services       Precautions / Restrictions Precautions Precautions: Knee Precaution Booklet Issued: Yes (comment) Restrictions Weight Bearing Restrictions: Yes LLE Weight Bearing: Weight bearing as tolerated    Mobility  Bed Mobility Overal bed mobility: Modified Independent             General bed mobility comments: Increased time. HOB elevated and use of bed rails.  Transfers Overall transfer level: Needs assistance Equipment used: Rolling walker (2 wheeled) Transfers: Sit to/from Stand Sit to Stand: Min assist         General transfer comment: Pt requires minA+1 to come to standing. Once upright she is steady and stable with UE  support on rolling walker  Ambulation/Gait Ambulation/Gait assistance: Min guard Ambulation Distance (Feet): 20 Feet Assistive device: Rolling walker (2 wheeled)     Gait velocity interpretation: <1.8 ft/sec, indicate of risk for recurrent falls General Gait Details: Pt initially with very slow ambulation but gradually improves with distance. She is able to ambulate to door and back to bed with rolling walker. Cues for upright posture, turns, and proper sequencing with walker. No external support required by therapist to stabilize. Pt removed from supplemental O2 and SaO2 drops to 85% on room air. O2 reapplied and SaO2 remains >90% on 2L/min    Stairs             Wheelchair Mobility    Modified Rankin (Stroke Patients Only)       Balance Overall balance assessment: Needs assistance Sitting-balance support: No upper extremity supported Sitting balance-Leahy Scale: Good     Standing balance support: Bilateral upper extremity supported Standing balance-Leahy Scale: Fair                              Cognition Arousal/Alertness: Awake/alert Behavior During Therapy: WFL for tasks assessed/performed Overall Cognitive Status: Within Functional Limits for tasks assessed                                        Exercises Total Joint Exercises Ankle Circles/Pumps: Both;10 reps Quad Sets: Both;10 reps Gluteal  Sets: Both;10 reps Towel Squeeze: Both;10 reps Short Arc Quad: Left;10 reps Heel Slides: Left;10 reps Hip ABduction/ADduction: Left;10 reps Straight Leg Raises: Left;10 reps Goniometric ROM: -13 to 75 degrees AAROM, pain limited    General Comments        Pertinent Vitals/Pain Pain Assessment: 0-10 Pain Score: 6  Pain Location: L knee Pain Descriptors / Indicators: Operative site guarding    Home Living                      Prior Function            PT Goals (current goals can now be found in the care plan section)  Acute Rehab PT Goals Patient Stated Goal: Return to prior level of function at home PT Goal Formulation: With patient/family Time For Goal Achievement: 12/15/17 Potential to Achieve Goals: Good Progress towards PT goals: Progressing toward goals    Frequency    BID      PT Plan Current plan remains appropriate    Co-evaluation              AM-PAC PT "6 Clicks" Daily Activity  Outcome Measure  Difficulty turning over in bed (including adjusting bedclothes, sheets and blankets)?: A Little Difficulty moving from lying on back to sitting on the side of the bed? : A Little Difficulty sitting down on and standing up from a chair with arms (e.g., wheelchair, bedside commode, etc,.)?: Unable Help needed moving to and from a bed to chair (including a wheelchair)?: A Little Help needed walking in hospital room?: A Little Help needed climbing 3-5 steps with a railing? : Total 6 Click Score: 14    End of Session Equipment Utilized During Treatment: Gait belt;Oxygen Activity Tolerance: Patient tolerated treatment well Patient left: with call bell/phone within reach;with SCD's reapplied;in chair;with chair alarm set;Other (comment)(towel roll under heel, polar care in place)   PT Visit Diagnosis: Muscle weakness (generalized) (M62.81);Difficulty in walking, not elsewhere classified (R26.2);Pain Pain - Right/Left: Left Pain - part of body: Knee     Time: 6468-0321 PT Time Calculation (min) (ACUTE ONLY): 31 min  Charges:  $Gait Training: 8-22 mins $Therapeutic Exercise: 8-22 mins                    G Codes:       Lyndel Safe Huprich PT, DPT     Huprich,Jason 12/02/2017, 1:09 PM

## 2017-12-02 NOTE — Clinical Social Work Note (Signed)
Clinical Social Work Assessment  Patient Details  Name: [name] MRN: [id number] Date of Birth: February 27, 1935  Date of referral:  12/02/17               Reason for consult:  Facility Placement                Permission sought to share information with:  Chartered certified accountant granted to share information::  Yes, Verbal Permission Granted  Name::      [place]::   [place]   Relationship::     Contact Information:     Housing/Transportation Living arrangements for the past 2 months:  [place] of Information:  Patient Patient Interpreter Needed:  None Criminal Activity/Legal Involvement Pertinent to Current Situation/Hospitalization:  No - Comment as needed Significant Relationships:  Adult Children, Siblings Lives with:  Adult Children Do you feel safe going back to the place where you live?  Yes Need for family participation in patient care:  Yes (Comment)  Care giving concerns:  Patient lives with her adult daughter who has cerebral palsy in [place].   Social Worker assessment / plan:  Holiday representative (CSW) received SNF consult. PT is recommending SNF. CSW met with patient alone at bedside to discuss D/C plan. Patient was alert and oriented X4 and was laying in the bed. CSW introduced self and explained role of CSW department. Patient reported that she is the caregiver for her adult daughter who has cerebral palsy. CSW explained SNF process and that [facility] will have to approve it. Patient is agreeable to SNF search in [facility] and prefers [facility]. FL2 complete and faxed out.   CSW presented bed offers to patient and she chose [facility]. Per Alger Simons liaison he will start [facility] SNF authorization today.   Per [facility] SNF authorization has been received and patient can D/C to [facility] over the weekend.   Employment status:  Disabled (Comment on whether or not currently receiving Disability), Retired Designer, industrial/product PT Recommendations:  [place] / Referral to community resources:  [place]  Patient/Family's Response to care:  Patient is agreeable to D/C to [facility].   Patient/Family's Understanding of and Emotional Response to Diagnosis, Current Treatment, and Prognosis:  Patient was very pleasant and thanked CSW for assistance.   Emotional Assessment Appearance:  Appears stated age Attitude/Demeanor/Rapport:    Affect (typically observed):  Accepting, Adaptable, Pleasant Orientation:  Oriented to Self, Oriented to Place, Oriented to  Time, Oriented to Situation Alcohol / Substance use:  Not Applicable Psych involvement (Current and /or in the community):  No (Comment)  Discharge Needs  Concerns to be addressed:  Discharge Planning Concerns Readmission within the last 30 days:  No Current discharge risk:  Dependent with Mobility Barriers to Discharge:  Continued Medical Work up   [employer], Veronia Beets, LCSW 12/02/2017, 5:24 PM

## 2017-12-02 NOTE — Clinical Social Work Placement (Signed)
   CLINICAL SOCIAL WORK PLACEMENT  NOTE  Date:  12/02/2017  Patient Details  Name: SEANA UNDERWOOD MRN: [id number] Date of Birth: 03/31/1935  Clinical Social Work is seeking post-discharge placement for this patient at the [place] level of care (*CSW will initial, date and re-position this form in  chart as items are completed):  Yes   Patient/family provided with [place] Work Department's list of facilities offering this level of care within the geographic area requested by the patient (or if unable, by the patient's family).  Yes   Patient/family informed of their freedom to choose among providers that offer the needed level of care, that participate in Medicare, Medicaid or managed care program needed by the patient, have an available bed and are willing to accept the patient.  Yes   Patient/family informed of [place]'s ownership interest in [facility] and [facility], as well as of the fact that they are under no obligation to receive care at these facilities.  PASRR submitted to EDS on       PASRR number received on       Existing PASRR number confirmed on 12/01/17     FL2 transmitted to all facilities in geographic area requested by pt/family on 12/02/17     FL2 transmitted to all facilities within larger geographic area on       Patient informed that his/her managed care company has contracts with or will negotiate with certain facilities, including the following:        Yes   Patient/family informed of bed offers received.  Patient chooses bed at ([facility] )     Physician recommends and patient chooses bed at      Patient to be transferred to   on  .  Patient to be transferred to facility by       Patient family notified on   of transfer.  Name of family member notified:        PHYSICIAN       Additional Comment:    _______________________________________________ Yuchen Fedor, [place] Beets, LCSW 12/02/2017, 5:23 PM

## 2017-12-02 NOTE — Progress Notes (Signed)
Patient's BP elevated gave scheduled pain meds early and pain meds re-check BP was 186/70. Notified MD.

## 2017-12-02 NOTE — Care Management Note (Addendum)
Case Management Note  Patient Details  Name: Mary [name] MRN: [id number] Date of Birth: 27-Aug-1934  Subjective/Objective:                  RNCM spoke with patient regarding transition of care. She states she lives alone and interested in hiring a nurse to sit with her.  RNCM will provide list of agencies to patient.  She states her sister will be available during the day starting Wednesday but not at night.  She states she has a front-wheeled walker however she requests a bedside commode. She uses [facility] for medications [phone]. They close at [facility] tomorrow and closed on Sunday's.  Action/Plan:  Referral to [place] with [facility] home health. Referral to [facility] for Bedside commode.  Lovenox 40mg  injection daily for 14 days no-refills called in to [facility] per Dr. Nicholaus Bloom request.   Expected Discharge Date:  12/03/17               Expected Discharge Plan:     In-House Referral:     Discharge planning Services  CM Consult  Post Acute Care Choice:  Home Health Choice offered to:  Patient  DME Arranged:    DME Agency:     HH Arranged:  PT HH Agency:  [facility]  Status of Service:  In process, will continue to follow  If discussed at Long Length of Stay Meetings, dates discussed:    Additional Comments: Lovenox $64; BSC has been delivered.  Marshell Garfinkel, RN 12/02/2017, 8:29 AM

## 2017-12-02 NOTE — Progress Notes (Signed)
Physical Therapy Treatment Patient Details Name: Mary [name] MRN: [id number] DOB: 08-05-1935 Today's Date: 12/02/2017    History of Present Illness 83 y/o female s/p L TKA 12/01/17 with significant post-op pain. PMH includes anxiety, depression, HTN, GERD, R THA and R TKA.     PT Comments    Pt continues to show very good effort with PT but is coming to realize that she is not quite doing well enough to be able to go home safely.  She is highly motivated but pain limited this afternoon, reliant on O2 (sats in mid/low 80s on room air) and still needing assist with mobility, unable to achieve TKE, fatiguing very quickly with slow, labored ambulation and generally making modest gains.  PT recommending pt do a stint in rehab before going home and and pt reluctantly agrees that this would be best.    Follow Up Recommendations  SNF(Pt simply not doing well enough to go home)     Equipment Recommendations  3in1 (PT)    Recommendations for Other Services       Precautions / Restrictions Precautions Precautions: Knee Precaution Booklet Issued: Yes (comment) Restrictions Weight Bearing Restrictions: Yes LLE Weight Bearing: Weight bearing as tolerated    Mobility  Bed Mobility Overal bed mobility: Needs Assistance Bed Mobility: Supine to Sit;Sit to Supine     Supine to sit: Min guard Sit to supine: Min assist   General bed mobility comments: labored in getting to sitting EOB, extra time and effort.  Pt did need light assist with LEs to get back up into bed.  Transfers Overall transfer level: Needs assistance Equipment used: Rolling walker (2 wheeled) Transfers: Sit to/from Stand Sit to Stand: Min assist         General transfer comment: Min assist from elevated bed height and heavy UE use.  Pt struggled to stand and needed direct assist to keep weight forward despite bed being elevated 2-3 inches.  Ambulation/Gait Ambulation/Gait assistance: Min guard Ambulation Distance  (Feet): 50 Feet Assistive device: Rolling walker (2 wheeled)(2L O2)     Gait velocity interpretation: <1.31 ft/sec, indicative of household ambulator General Gait Details: Pt again started out very slowly and cautiously.  She did show improved confidence/speed/cadence with increased distance, but overall did show increased distance and confidence with ambulation this afternoon.   Stairs             Wheelchair Mobility    Modified Rankin (Stroke Patients Only)       Balance Overall balance assessment: Needs assistance Sitting-balance support: No upper extremity supported Sitting balance-Leahy Scale: Good     Standing balance support: Bilateral upper extremity supported Standing balance-Leahy Scale: Fair                              Cognition Arousal/Alertness: Awake/alert Behavior During Therapy: WFL for tasks assessed/performed Overall Cognitive Status: Within Functional Limits for tasks assessed                                        Exercises Total Joint Exercises Ankle Circles/Pumps: Strengthening;10 reps Quad Sets: Strengthening;10 reps Gluteal Sets: Strengthening;10 reps Towel Squeeze: Both;10 reps Short Arc Quad: AROM;10 reps(gentle PROM for last few degrees toward TKE) Heel Slides: AAROM;10 reps Hip ABduction/ADduction: Strengthening;AROM;10 reps Straight Leg Raises: AROM;10 reps Goniometric ROM: 5-84    General Comments General  comments (skin integrity, edema, etc.): Pt on room air on arrival, after 1 set of 10 reps exercises she was fatigued and O2 sats were checked and at 83%, unable to increase with purposeful breathing, donned 2 liters and quickly increased to mid 90s.      Pertinent Vitals/Pain Pain Assessment: 0-10 Pain Score: 6  Pain Location: L knee Pain Descriptors / Indicators: Operative site guarding    Home Living                      Prior Function            PT Goals (current goals can now  be found in the care plan section) Acute Rehab PT Goals Patient Stated Goal: Return to prior level of function at home PT Goal Formulation: With patient/family Time For Goal Achievement: 12/15/17 Potential to Achieve Goals: Good Progress towards PT goals: Progressing toward goals    Frequency    BID      PT Plan Discharge plan needs to be updated    Co-evaluation              AM-PAC PT "6 Clicks" Daily Activity  Outcome Measure  Difficulty turning over in bed (including adjusting bedclothes, sheets and blankets)?: A Little Difficulty moving from lying on back to sitting on the side of the bed? : A Lot Difficulty sitting down on and standing up from a chair with arms (e.g., wheelchair, bedside commode, etc,.)?: Unable Help needed moving to and from a bed to chair (including a wheelchair)?: A Little Help needed walking in hospital room?: A Little Help needed climbing 3-5 steps with a railing? : Total 6 Click Score: 13    End of Session Equipment Utilized During Treatment: Gait belt;Oxygen Activity Tolerance: Patient tolerated treatment well Patient left: with call bell/phone within reach;with SCD's reapplied;in chair;with chair alarm set;Other (comment)   PT Visit Diagnosis: Muscle weakness (generalized) (M62.81);Difficulty in walking, not elsewhere classified (R26.2);Pain Pain - Right/Left: Left Pain - part of body: Knee     Time: 0254-2706 PT Time Calculation (min) (ACUTE ONLY): 44 min  Charges:  $Gait Training: 8-22 mins $Therapeutic Exercise: 23-37 mins                    G Codes:       Kreg Shropshire, DPT 12/02/2017, 4:27 PM

## 2017-12-02 NOTE — Anesthesia Postprocedure Evaluation (Signed)
Anesthesia Post Note  Patient: Mary [name]  Procedure(s) Performed: TOTAL KNEE ARTHROPLASTY (Left )  Patient location during evaluation: Nursing Unit Anesthesia Type: Spinal Level of consciousness: awake, awake and alert, oriented and sedated Pain management: pain level controlled Vital Signs Assessment: post-procedure vital signs reviewed and stable Respiratory status: spontaneous breathing, nonlabored ventilation and respiratory function stable Cardiovascular status: blood pressure returned to baseline and stable Postop Assessment: no headache, no backache, patient able to bend at knees, no apparent nausea or vomiting and adequate PO intake Anesthetic complications: no     Last Vitals:  Vitals:   12/02/17 0030 12/02/17 0633  BP: (!) 170/62   Pulse: 67 69  Resp:    Temp:    SpO2: 93% 93%    Last Pain:  Vitals:   12/02/17 0028  TempSrc: Oral  PainSc:                  Ricki Miller

## 2017-12-02 NOTE — Progress Notes (Signed)
   Subjective: 1 Day Post-Op Procedure(s) (LRB): TOTAL KNEE ARTHROPLASTY (Left) Patient reports pain as 5 on 0-10 scale.   Patient is well, and has had no acute complaints or problems Denies any CP, SOB, ABD pain. We will continue therapy today.  Plan is to go Home after hospital stay.  Objective: Vital signs in last 24 hours: Temp:  [97.5 F (36.4 C)-98.5 F (36.9 C)] 97.5 F (36.4 C) (04/12 0028) Pulse Rate:  [62-72] 69 (04/12 0633) Resp:  [12-23] 18 (04/11 2105) BP: (151-186)/(57-100) 170/62 (04/12 0030) SpO2:  [84 %-100 %] 93 % (04/12 0370) Weight:  [190 lb 11.2 oz (86.5 kg)] 190 lb 11.2 oz (86.5 kg) (04/11 1300)  Intake/Output from previous day: 04/11 0701 - 04/12 0700 In: 1505 [P.O.:60; I.V.:1345; IV Piggyback:100] Out: 2310 [Urine:2300; Blood:10] Intake/Output this shift: Total I/O In: 360 [P.O.:360] Out: -   Recent Labs    12/02/17 0424  HGB 10.3*   Recent Labs    12/02/17 0424  WBC 10.4  RBC 3.81  HCT 32.6*  PLT 186   Recent Labs    12/02/17 0424  NA 137  K 4.2  CL 100*  CO2 31  BUN 10  CREATININE 0.46  GLUCOSE 155*  CALCIUM 8.8*   No results for input(s): LABPT, INR in the last 72 hours.  EXAM General - Patient is Alert, Appropriate and Oriented Extremity - Neurovascular intact Sensation intact distally Intact pulses distally Dorsiflexion/Plantar flexion intact No cellulitis present Compartment soft Dressing - dressing C/D/I and no drainage Motor Function - intact, moving foot and toes well on exam.   Past Medical History:  Diagnosis Date  . Acid reflux   . Arthritis   . Asthma   . Depression   . Diabetes ([place])   . Dysrhythmia   . Heart murmur   . HLD (hyperlipidemia)   . HTN (hypertension)   . Hypotension    when get up too quickly  . Sleep apnea   . Urinary urgency     Assessment/Plan:   1 Day Post-Op Procedure(s) (LRB): TOTAL KNEE ARTHROPLASTY (Left) Active Problems:   Status post total knee replacement using  cement, left  Acute post op blood loss anemia with underlying chronic anemia  Estimated body mass index is 30.78 kg/m as calculated from the following:   Height as of this encounter: 5\' 6"  (1.676 m).   Weight as of this encounter: 190 lb 11.2 oz (86.5 kg). Advance diet Up with therapy  Needs BM Pain controlled VSS Acute post op blood loss anemia with underlying chronic anemia - Hgb stable.  Recheck labs in the am  DVT Prophylaxis - Lovenox, Foot Pumps and TED hose Weight-Bearing as tolerated to left leg   T. Rachelle Hora, PA-C [place] 12/02/2017, 11:54 AM

## 2017-12-03 ENCOUNTER — Inpatient Hospital Stay: Payer: Medicare Other

## 2017-12-03 LAB — BASIC METABOLIC PANEL
ANION GAP: 5 (ref 5–15)
BUN: 9 mg/dL (ref 6–20)
CO2: 32 mmol/L (ref 22–32)
Calcium: 9 mg/dL (ref 8.9–10.3)
Chloride: 98 mmol/L — ABNORMAL LOW (ref 101–111)
Creatinine, Ser: 0.34 mg/dL — ABNORMAL LOW (ref 0.44–1.00)
Glucose, Bld: 148 mg/dL — ABNORMAL HIGH (ref 65–99)
Potassium: 3.7 mmol/L (ref 3.5–5.1)
Sodium: 135 mmol/L (ref 135–145)

## 2017-12-03 LAB — CBC
HCT: 31.5 % — ABNORMAL LOW (ref 35.0–47.0)
HEMOGLOBIN: 10.2 g/dL — AB (ref 12.0–16.0)
MCH: 27.5 pg (ref 26.0–34.0)
MCHC: 32.3 g/dL (ref 32.0–36.0)
MCV: 85.1 fL (ref 80.0–100.0)
Platelets: 147 10*3/uL — ABNORMAL LOW (ref 150–440)
RBC: 3.7 MIL/uL — AB (ref 3.80–5.20)
RDW: 16.3 % — ABNORMAL HIGH (ref 11.5–14.5)
WBC: 8.2 10*3/uL (ref 3.6–11.0)

## 2017-12-03 LAB — GLUCOSE, CAPILLARY
GLUCOSE-CAPILLARY: 148 mg/dL — AB (ref 65–99)
GLUCOSE-CAPILLARY: 107 mg/dL — AB (ref 65–99)
Glucose-Capillary: 164 mg/dL — ABNORMAL HIGH (ref 65–99)
Glucose-Capillary: 123 mg/dL — ABNORMAL HIGH (ref 65–99)

## 2017-12-03 MED ORDER — ENOXAPARIN SODIUM 40 MG/0.4ML ~~LOC~~ SOLN: 40.0000 mg | SUBCUTANEOUS | 0 refills | Status: DC

## 2017-12-03 MED ORDER — ISOSORBIDE MONONITRATE ER 30 MG PO TB24
30.0000 mg | ORAL_TABLET | Freq: Every day | ORAL | Status: DC
  Administered 2017-12-03 – 2017-12-04 (×2): 30 mg via ORAL
  Filled 2017-12-03 (×2): qty 1

## 2017-12-03 MED ORDER — OXYCODONE HCL 5 MG PO TABS: 5.0000 mg | ORAL_TABLET | ORAL | 0 refills | Status: DC | PRN

## 2017-12-03 MED ORDER — DOCUSATE SODIUM 100 MG PO CAPS: 100.0000 mg | ORAL_CAPSULE | Freq: Two times a day (BID) | ORAL | 0 refills | Status: DC

## 2017-12-03 MED ORDER — FUROSEMIDE 10 MG/ML IJ SOLN
20.0000 mg | Freq: Once | INTRAMUSCULAR | Status: AC
  Administered 2017-12-03: 20 mg via INTRAVENOUS
  Filled 2017-12-03: qty 4

## 2017-12-03 NOTE — Progress Notes (Signed)
Physical Therapy Treatment Patient Details Name: Mary [name] MRN: [id number] DOB: 26-Jul-1935 Today's Date: 12/03/2017    History of Present Illness 83 y/o female s/p L TKA 12/01/17 with significant post-op pain. PMH includes anxiety, depression, HTN, GERD, R THA and R TKA.     PT Comments    Participated in exercises as described below.  Pt to edge of bed with increased time and rail but no physical assist.  She stood with increased time and min assist and was able to slowly walk 15' in room to door.  Initially stating "I can't do this" but gained confidence with time.  Seated rest before asking to be brought closer to recliner for transfer.  While pt stated mobility was overall easier than yesterday, gait distance was limited.  Remained up in recliner.  3 lpm O2 during session and at rest.   Follow Up Recommendations  SNF     Equipment Recommendations       Recommendations for Other Services       Precautions / Restrictions Precautions Precautions: Knee Restrictions Weight Bearing Restrictions: Yes LLE Weight Bearing: Weight bearing as tolerated    Mobility  Bed Mobility Overal bed mobility: Needs Assistance Bed Mobility: Sit to Supine     Supine to sit: Min guard        Transfers Overall transfer level: Needs assistance Equipment used: Rolling walker (2 wheeled) Transfers: Sit to/from Stand Sit to Stand: Min assist         General transfer comment: Min assist from elevated bed height and heavy UE use.  Pt struggled to stand and needed direct assist to keep weight forward despite bed being elevated 2-3 inches.  Ambulation/Gait Ambulation/Gait assistance: Min guard;Min assist Ambulation Distance (Feet): 15 Feet Assistive device: Rolling walker (2 wheeled) Gait Pattern/deviations: Step-to pattern;Trunk flexed;Decreased step length - right;Decreased step length - left Gait velocity: slow   General Gait Details: very slow gait, hesitant today and limited by  pain.   Stairs             Wheelchair Mobility    Modified Rankin (Stroke Patients Only)       Balance Overall balance assessment: Needs assistance Sitting-balance support: No upper extremity supported Sitting balance-Leahy Scale: Good     Standing balance support: Bilateral upper extremity supported Standing balance-Leahy Scale: Fair                              Cognition Arousal/Alertness: Awake/alert Behavior During Therapy: WFL for tasks assessed/performed Overall Cognitive Status: Within Functional Limits for tasks assessed                                        Exercises Total Joint Exercises Ankle Circles/Pumps: Strengthening;10 reps Quad Sets: Strengthening;10 reps Gluteal Sets: Strengthening;10 reps Short Arc Quad: AROM;10 reps Heel Slides: AAROM;10 reps Hip ABduction/ADduction: Strengthening;AROM;10 reps Straight Leg Raises: AROM;10 reps Knee Flexion: 10 reps;AAROM;Seated Goniometric ROM: 5-73    General Comments        Pertinent Vitals/Pain Pain Assessment: 0-10 Pain Score: 5  Pain Location: L knee Pain Descriptors / Indicators: Sore;Operative site guarding Pain Intervention(s): Limited activity within patient's tolerance;Monitored during session;Premedicated before session    Home Living                      Prior Function  PT Goals (current goals can now be found in the care plan section) Progress towards PT goals: Progressing toward goals    Frequency    BID      PT Plan Current plan remains appropriate    Co-evaluation              AM-PAC PT "6 Clicks" Daily Activity  Outcome Measure  Difficulty turning over in bed (including adjusting bedclothes, sheets and blankets)?: A Little Difficulty moving from lying on back to sitting on the side of the bed? : A Lot Difficulty sitting down on and standing up from a chair with arms (e.g., wheelchair, bedside commode, etc,.)?:  Unable Help needed moving to and from a bed to chair (including a wheelchair)?: A Little Help needed walking in hospital room?: A Little Help needed climbing 3-5 steps with a railing? : Total 6 Click Score: 13    End of Session Equipment Utilized During Treatment: Gait belt;Oxygen Activity Tolerance: Patient tolerated treatment well Patient left: with call bell/phone within reach;in chair;with chair alarm set   Pain - Right/Left: Left Pain - part of body: Knee     Time: 5681-2751 PT Time Calculation (min) (ACUTE ONLY): 24 min  Charges:  $Gait Training: 8-22 mins $Therapeutic Exercise: 8-22 mins                    G Codes:       Chesley Noon, PTA 12/03/17, 10:38 AM

## 2017-12-03 NOTE — Progress Notes (Signed)
   Subjective: 2 Days Post-Op Procedure(s) (LRB): TOTAL KNEE ARTHROPLASTY (Left) Patient reports pain as moderate. Pain is improving. Patient is well, and has had no acute complaints or problems Denies any CP, SOB, ABD pain. We will continue therapy today.  Plan is to go Skilled nursing facility after hospital stay.  Objective: Vital signs in last 24 hours: Temp:  [99.2 F (37.3 C)] 99.2 F (37.3 C) (04/12 2116) Pulse Rate:  [73-81] 81 (04/13 0742) Resp:  [18-19] 18 (04/13 0742) BP: (156-213)/(68-82) 177/68 (04/13 0742) SpO2:  [90 %-99 %] 99 % (04/13 0742)  Intake/Output from previous day: 04/12 0701 - 04/13 0700 In: 720 [P.O.:720] Out: 155 [Urine:155] Intake/Output this shift: No intake/output data recorded.  Recent Labs    12/02/17 0424 12/03/17 0332  HGB 10.3* 10.2*   Recent Labs    12/02/17 0424 12/03/17 0332  WBC 10.4 8.2  RBC 3.81 3.70*  HCT 32.6* 31.5*  PLT 186 147*   Recent Labs    12/02/17 0424 12/03/17 0332  NA 137 135  K 4.2 3.7  CL 100* 98*  CO2 31 32  BUN 10 9  CREATININE 0.46 0.34*  GLUCOSE 155* 148*  CALCIUM 8.8* 9.0   No results for input(s): LABPT, INR in the last 72 hours.  EXAM General - Patient is Alert, Appropriate and Oriented Extremity - Neurovascular intact Sensation intact distally Intact pulses distally Dorsiflexion/Plantar flexion intact No cellulitis present Compartment soft Dressing - dressing C/D/I, moderate drainage and new dressing applied Motor Function - intact, moving foot and toes well on exam.   Past Medical History:  Diagnosis Date  . Acid reflux   . Arthritis   . Asthma   . Depression   . Diabetes ([place])   . Dysrhythmia   . Heart murmur   . HLD (hyperlipidemia)   . HTN (hypertension)   . Hypotension    when get up too quickly  . Sleep apnea   . Urinary urgency     Assessment/Plan:   2 Days Post-Op Procedure(s) (LRB): TOTAL KNEE ARTHROPLASTY (Left) Active Problems:   Status post total knee  replacement using cement, left   Acute post op blood loss anemia with underlying chronic anemia   Hypertension  Estimated body mass index is 30.78 kg/m as calculated from the following:   Height as of this encounter: 5\' 6"  (1.676 m).   Weight as of this encounter: 86.5 kg (190 lb 11.2 oz). Advance diet Up with therapy  Needs BM Pain controlled, improving VSS Acute post op blood loss anemia with underlying chronic anemia - Hgb 10.2, stable.  Hypertension -patient asymptomatic.  Postoperative pain seems to be controlled although patient has not taken much medications for pain.  Dr. Roland Rack discussed with internist this morning, internist will come evaluate the patient. Plan on discharge to skilled nursing facility Sunday.   DVT Prophylaxis - Lovenox, Foot Pumps and TED hose Weight-Bearing as tolerated to left leg   T. Rachelle Hora, PA-C [place] 12/03/2017, 8:13 AM

## 2017-12-03 NOTE — Discharge Instructions (Signed)

## 2017-12-03 NOTE — Discharge Summary (Signed)
Physician Discharge Summary  [name] MRN: [id number] DOB/AGE: June 30, 1935 83 y.o.  Admit date: 12/01/2017 Discharge date: 12/04/2017 Admission Diagnoses:  OSTEOARTHRITIS LEFT KNEE   Discharge Diagnoses: Patient Active Problem List   Diagnosis Date Noted  . Status post total knee replacement using cement, left 12/01/2017  . Hypertensive urgency 05/19/2017  . Anxiety 05/19/2016  . Insomnia 05/19/2016  . Pure hypercholesterolemia 05/19/2016  . Personal history of disease 03/11/2016  . Status post hip hemiarthroplasty 10/10/2015  . Hypoxia 09/26/2015  . Hypoxemia 09/26/2015  . Fracture of femoral neck, right ([place]) 09/23/2015  . Type 2 diabetes mellitus ([place]) 09/23/2015  . HTN (hypertension) 09/23/2015  . HLD (hyperlipidemia) 09/23/2015  . GERD (gastroesophageal reflux disease) 09/23/2015  . Depression 09/23/2015  . Hypothyroidism 09/23/2015  . DDD (degenerative disc disease), lumbar 08/26/2015  . Lumbar stenosis with neurogenic claudication 08/26/2015  . Nocturia 04/29/2015  . Urinary frequency 04/29/2015  . Atrophic vaginitis 04/29/2015  . Mixed incontinence 06/28/2014  . Lumbar radiculopathy 02/08/2013    Past Medical History:  Diagnosis Date  . Acid reflux   . Arthritis   . Asthma   . Depression   . Diabetes ([place])   . Dysrhythmia   . Heart murmur   . HLD (hyperlipidemia)   . HTN (hypertension)   . Hypotension    when get up too quickly  . Sleep apnea   . Urinary urgency      Transfusion: none   Consultants (if any):   Discharged Condition: Improved  Hospital Course: Mary [name] is an 83 y.o. female who was admitted 12/01/2017 with a diagnosis of left knee osteoarthritis and went to the operating room on 12/01/2017 and underwent the above named procedures.    Surgeries: Procedure(s): TOTAL KNEE ARTHROPLASTY on 12/01/2017 Patient tolerated the surgery well. Taken to PACU where she was stabilized and then transferred to the orthopedic  floor.  Started on Lovenox 40 mg q 24 hrs. Foot pumps applied bilaterally at 80 mm. Heels elevated on bed with rolled towels. No evidence of DVT. Negative [place]. Physical therapy started on day #1 for gait training and transfer. OT started day #1 for ADL and assisted devices.  Patient's foley was d/c on day #1. Patient's IV and  was d/c on day #2.  On postop day 2, patient was noted to be hypertensive.  Patient was asymptomatic.  Medicine was consulted to evaluate patient's hypertension.Hydralazine was given as needed.  Blood pressure did improve.  On post op day #3 patient was stable and ready for discharge to skilled nursing facility.    Implants: Left TKA using all-cemented [employer] Vanguard system with a 62.5 mm PCR femur, a 67 mm tibial tray with a 12 mm AS E-poly insert, and a 31 x 8 mm all-poly 3-pegged domed patella    She was given perioperative antibiotics:  Anti-infectives (From admission, onward)   Start     Dose/Rate Route Frequency Ordered Stop   12/01/17 1500  clindamycin (CLEOCIN) IVPB 900 mg     900 mg 100 mL/hr over 30 Minutes Intravenous Every 6 hours 12/01/17 1259 12/02/17 0555   12/01/17 0700  clindamycin (CLEOCIN) IVPB 900 mg     900 mg 100 mL/hr over 30 Minutes Intravenous  Once 12/01/17 0656 12/01/17 0913   12/01/17 0654  clindamycin (CLEOCIN) 900 MG/50ML IVPB    Note to Pharmacy:  Myles Lipps   : cabinet override      12/01/17 0654 12/01/17 0903    .  She was given sequential compression devices, early ambulation, and Lovenox for DVT prophylaxis.  She benefited maximally from the hospital stay and there were no complications.    Recent vital signs:  Vitals:   12/04/17 0851 12/04/17 1302  BP: 121/70 131/90  Pulse: 76 79  Resp:    Temp:  98.2 F (36.8 C)  SpO2: 98% 94%    Recent laboratory studies:  Lab Results  Component Value Date   HGB 10.2 (L) 12/03/2017   HGB 10.3 (L) 12/02/2017   HGB 11.7 (L) 11/23/2017   Lab Results  Component Value  Date   WBC 8.2 12/03/2017   PLT 147 (L) 12/03/2017   Lab Results  Component Value Date   INR 0.96 11/23/2017   Lab Results  Component Value Date   NA 133 (L) 12/04/2017   K 4.0 12/04/2017   CL 94 (L) 12/04/2017   CO2 35 (H) 12/04/2017   BUN 14 12/04/2017   CREATININE 0.56 12/04/2017   GLUCOSE 129 (H) 12/04/2017    Discharge Medications:   Allergies as of 12/04/2017      Reactions   Cephalexin Hives   Nitrofurantoin    Other reaction(s): Other (See Comments) Other Reaction: measles-like lesions   Sulfa Antibiotics Hives   Atorvastatin Other (See Comments)   Muscle spasms      Medication List    STOP taking these medications   ADVIL PM 200-25 MG Caps Generic drug:  Ibuprofen-diphenhydrAMINE HCl   aspirin 81 MG tablet   naproxen sodium 220 MG tablet Commonly known as:  ALEVE     TAKE these medications   acetaminophen 500 MG tablet Commonly known as:  TYLENOL Take 500 mg by mouth every 6 (six) hours as needed.   amLODipine 10 MG tablet Commonly known as:  NORVASC Take 10 mg by mouth daily at 2 PM.   ARTIFICIAL TEARS OP Place 1 drop into both eyes daily as needed (dry eyes).   busPIRone 10 MG tablet Commonly known as:  BUSPAR Take 20 mg by mouth 2 (two) times daily.   conjugated estrogens vaginal cream Commonly known as:  PREMARIN Place 1 Applicatorful vaginally daily.   docusate sodium 100 MG capsule Commonly known as:  COLACE Take 1 capsule (100 mg total) by mouth 2 (two) times daily.   enoxaparin 40 MG/0.4ML injection Commonly known as:  LOVENOX Inject 0.4 mLs (40 mg total) into the skin daily for 14 days.   furosemide 20 MG tablet Commonly known as:  LASIX Take 20 mg by mouth daily as needed for edema.   hydrALAZINE 25 MG tablet Commonly known as:  APRESOLINE Take 1 tablet (25 mg total) by mouth every 8 (eight) hours.   hydrochlorothiazide 25 MG tablet Commonly known as:  HYDRODIURIL Take 1 tablet (25 mg total) by mouth daily.    isosorbide mononitrate 30 MG 24 hr tablet Commonly known as:  IMDUR Take 1 tablet (30 mg total) by mouth daily.   levothyroxine 75 MCG tablet Commonly known as:  SYNTHROID, LEVOTHROID Take 75 mcg by mouth daily.   losartan 100 MG tablet Commonly known as:  COZAAR Take 100 mg by mouth daily.   lovastatin 20 MG tablet Commonly known as:  MEVACOR Take 20 mg by mouth 3 (three) times a week.   metFORMIN 500 MG 24 hr tablet Commonly known as:  GLUCOPHAGE-XR Take 500 mg by mouth daily.   omeprazole 40 MG capsule Commonly known as:  PRILOSEC Take 40 mg by mouth daily.  oxyCODONE 5 MG immediate release tablet Commonly known as:  Oxy IR/ROXICODONE Take 1-2 tablets (5-10 mg total) by mouth every 4 (four) hours as needed for moderate pain (pain score 4-6).   PARoxetine 40 MG tablet Commonly known as:  PAXIL Take 40 mg by mouth every evening.   solifenacin 10 MG tablet Commonly known as:  VESICARE Take 1 tablet (10 mg total) by mouth daily.   vitamin B-12 1000 MCG tablet Commonly known as:  CYANOCOBALAMIN Take 2,500 mcg by mouth daily.   vitamin E 400 UNIT capsule Take 400 Units by mouth 4 (four) times a week.            Durable Medical Equipment  (From admission, onward)        Start     Ordered   12/02/17 0838  For home use only DME Bedside commode  Once    Question:  Patient needs a bedside commode to treat with the following condition  Answer:  Difficulty walking   12/02/17 0838   12/01/17 1300  DME Bedside commode  Once    Question:  Patient needs a bedside commode to treat with the following condition  Answer:  Status post total knee replacement using cement, left   12/01/17 1259   12/01/17 1300  DME 3 n 1  Once     12/01/17 1259   12/01/17 1300  DME Walker rolling  Once    Question:  Patient needs a walker to treat with the following condition  Answer:  Status post total knee replacement using cement, left   12/01/17 1259      Diagnostic Studies: Dg  Chest Port 1 View  Result Date: 12/03/2017 CLINICAL DATA:  83 year old female with a history of shortness of breath EXAM: PORTABLE CHEST 1 VIEW COMPARISON:  None. FINDINGS: Cardiomediastinal silhouette unchanged in size and contour. Low lung volumes persist with coarsened interstitial markings. No pneumothorax or pleural effusion. No confluent airspace disease. IMPRESSION: No evidence of acute cardiopulmonary disease Electronically Signed   By: Corrie Mckusick D.O.   On: 12/03/2017 09:08   Dg Knee Left Port  Result Date: 12/01/2017 CLINICAL DATA:  Post LEFT knee replacement EXAM: PORTABLE LEFT KNEE - 1-2 VIEW COMPARISON:  Portable exam 1132 hours compared to 01/06/2017 FINDINGS: Diffuse osseous demineralization. Components of a LEFT knee prosthesis are identified in expected position. No acute fracture, dislocation or bone destruction. Expected postsurgical changes of the anterior soft tissues of the LEFT knee. IMPRESSION: LEFT knee prosthesis without acute complication. Electronically Signed   By: Lavonia Dana M.D.   On: 12/01/2017 12:20    Disposition: Discharge disposition: 03-[facility] information for follow-up providers    Lattie Corns, PA-C Follow up in 2 week(s).   Specialty:  Physician Assistant Contact information: [address] [place] [id number] [phone]            Contact information for after-discharge care    Destination    [facility] .   Service:  Skilled Nursing Contact information: [address] [place] [phone]                   Signed: Feliberto Gottron 12/04/2017, 1:40 PM

## 2017-12-03 NOTE — Care Management (Signed)
Plan for SNF. Lovenox cancelled with [facility] for medications [phone]. Patient had planned to pay privately for bedside commode with [facility] so I did not ask them to pick that up. I have updated [place] with [facility] that patient will need to [facility] followed [facility] as [facility] requested [facility] for HHPT.

## 2017-12-03 NOTE — Progress Notes (Signed)
Physical Therapy Treatment Patient Details Name: Mary [name] MRN: [id number] DOB: 1935-01-24 Today's Date: 12/03/2017    History of Present Illness 83 y/o female s/p L TKA 12/01/17 with significant post-op pain. PMH includes anxiety, depression, HTN, GERD, R THA and R TKA.     PT Comments    Pt in bed.  Stated she returned to bed earlier with nursing after using commode due to back pain.  Wanted to get back up.  Pointed to throat "I feel like it's all backed up"  To edge of bed with min guard.  Once sitting began dry heaves.  Stood with min assist and transferred to recliner at bedside.  Incontinent of urine while coughing.  States it is her baseline.  Remained up in recliner per her request but session limited due to continued nausea.   Follow Up Recommendations  SNF     Equipment Recommendations       Recommendations for Other Services       Precautions / Restrictions Precautions Precautions: Knee Restrictions Weight Bearing Restrictions: Yes LLE Weight Bearing: Weight bearing as tolerated    Mobility  Bed Mobility Overal bed mobility: Needs Assistance Bed Mobility: Sit to Supine     Supine to sit: Min guard        Transfers Overall transfer level: Needs assistance Equipment used: Rolling walker (2 wheeled) Transfers: Sit to/from Stand Sit to Stand: Min assist         General transfer comment: Min assist from elevated bed height and heavy UE use.  Pt struggled to stand and needed direct assist to keep weight forward despite bed being elevated 2-3 inches.  Ambulation/Gait Ambulation/Gait assistance: Min assist Ambulation Distance (Feet): 3 Feet Assistive device: Rolling walker (2 wheeled) Gait Pattern/deviations: Step-to pattern Gait velocity: slow   General Gait Details: very slow gait, hesitant today and limited by pain.   Stairs             Wheelchair Mobility    Modified Rankin (Stroke Patients Only)       Balance Overall balance  assessment: Needs assistance Sitting-balance support: No upper extremity supported Sitting balance-Leahy Scale: Good     Standing balance support: Bilateral upper extremity supported Standing balance-Leahy Scale: Fair                              Cognition Arousal/Alertness: Awake/alert Behavior During Therapy: WFL for tasks assessed/performed Overall Cognitive Status: Within Functional Limits for tasks assessed                                        Exercises Total Joint Exercises Ankle Circles/Pumps: Strengthening;10 reps Quad Sets: Strengthening;10 reps Gluteal Sets: Strengthening;10 reps Short Arc Quad: AROM;10 reps Heel Slides: AAROM;10 reps Hip ABduction/ADduction: Strengthening;AROM;10 reps Straight Leg Raises: AROM;10 reps Knee Flexion: 10 reps;AAROM;Seated Goniometric ROM: 5-73    General Comments        Pertinent Vitals/Pain Pain Assessment: 0-10 Pain Score: 5  Pain Location: L knee Pain Descriptors / Indicators: Sore;Operative site guarding Pain Intervention(s): Limited activity within patient's tolerance    Home Living                      Prior Function            PT Goals (current goals can now be found in the  care plan section) Progress towards PT goals: Progressing toward goals    Frequency    BID      PT Plan Current plan remains appropriate    Co-evaluation              AM-PAC PT "6 Clicks" Daily Activity  Outcome Measure  Difficulty turning over in bed (including adjusting bedclothes, sheets and blankets)?: A Little Difficulty moving from lying on back to sitting on the side of the bed? : A Lot Difficulty sitting down on and standing up from a chair with arms (e.g., wheelchair, bedside commode, etc,.)?: Unable Help needed moving to and from a bed to chair (including a wheelchair)?: A Little Help needed walking in hospital room?: A Little Help needed climbing 3-5 steps with a railing? :  Total 6 Click Score: 13    End of Session Equipment Utilized During Treatment: Oxygen;Gait belt Activity Tolerance: Other (comment) Patient left: in chair;with chair alarm set;with call bell/phone within reach Nurse Communication: Mobility status;Other (comment) Pain - Right/Left: Left Pain - part of body: Knee     Time: 1638-4536 PT Time Calculation (min) (ACUTE ONLY): 9 min  Charges:  $Gait Training: 8-22 mins $Therapeutic Exercise: 8-22 mins $Therapeutic Activity: 8-22 mins                    G Codes:       Chesley Noon, PTA 12/03/17, 12:09 PM

## 2017-12-03 NOTE — Consult Note (Signed)
Patient Demographics  Mary [name], is a 83 y.o. female   MRN: [id number]   DOB - Jan 28, 1935  Admit Date - 12/01/2017    Outpatient Primary MD for the patient is Dion Body, MD  Consult requested in the Hospital by Poggi, Marshall Cork, MD, On 12/03/2017    Reason for consult ; management of hypertension  PA: 83 year old female patient admitted for the service for left knee arthroplasty, we are consulted for management of hypertension.  Patient has been having issues with blood pressure, blood pressure as high as 186/70 requiring hydralazine tablets couple of times yesterday.Marland Kitchen  She was started back on her home medications for the blood pressure including amlodipine, furosemide as needed, losartan.  But the blood pressure is still running high.  Patient denies any chest pain or shortness of breath.  Complains of left knee pain.  Also noted to have hypoxia with room air saturation in 70s so she is now on 3 L of oxygen to keep sats more than 90%.  she has history of asthma.   With History of -  Past Medical History:  Diagnosis Date  . Acid reflux   . Arthritis   . Asthma   . Depression   . Diabetes ([place])   . Dysrhythmia   . Heart murmur   . HLD (hyperlipidemia)   . HTN (hypertension)   . Hypotension    when get up too quickly  . Sleep apnea   . Urinary urgency       Past Surgical History:  Procedure Laterality Date  . ABDOMINAL HYSTERECTOMY    . CYSTOSCOPY    . HIP ARTHROPLASTY Right 09/24/2015   Procedure: ARTHROPLASTY BIPOLAR HIP (HEMIARTHROPLASTY);  Surgeon: Corky Mull, MD;  Location: [facility] ORS;  Service: Orthopedics;  Laterality: Right;  . JOINT REPLACEMENT     right hip  . JOINT REPLACEMENT     right knee  . KNEE ARTHROSCOPY W/ AUTOGENOUS CARTILAGE IMPLANTATION (ACI) PROCEDURE    . TOTAL KNEE ARTHROPLASTY  Left 12/01/2017   Procedure: TOTAL KNEE ARTHROPLASTY;  Surgeon: Corky Mull, MD;  Location: [facility] ORS;  Service: Orthopedics;  Laterality: Left;  Marland Kitchen VARICOSE VEIN SURGERY    . VEIN SURGERY           Review of Systems    In addition to the HPI abov No Fever-chills, No Headache, No changes with Vision or hearing, No problems swallowing food or Liquids, No Chest pain, Cough or Shortness of Breath, No Abdominal pain, No Nausea or Vommitting, Bowel movements are regular, No Blood in stool or Urine, No dysuria, No new skin rashes or bruises, c/o of left knee pain status post surgery yesterday. No new weakness, tingling, numbness in any extremity, No recent weight gain or loss, No polyuria, polydypsia or polyphagia, No significant Mental Stressors.  A full 10 point Review of Systems was done, except as stated above, all other Review of Systems were negative.   Social History Social History   Tobacco Use  .  Smoking status: Never Smoker  . Smokeless tobacco: Never Used  Substance Use Topics  . Alcohol use: No    Alcohol/week: 0.0 oz     Family History Family History  Problem Relation Age of Onset  . Kidney disease Brother        also nephew  . Heart disease Mother   . Heart disease Father   . Prostate cancer Neg Hx   . Bladder Cancer Neg Hx   . Breast cancer Neg Hx   . Kidney cancer Neg Hx      Prior to Admission medications   Medication Sig Start Date End Date Taking? Authorizing Provider  acetaminophen (TYLENOL) 500 MG tablet Take 500 mg by mouth every 6 (six) hours as needed.   Yes [provider]  amLODipine (NORVASC) 10 MG tablet Take 10 mg by mouth daily at 2 PM.    Yes [provider]  aspirin 81 MG tablet Take 81 mg by mouth daily.    Yes [provider]  busPIRone (BUSPAR) 10 MG tablet Take 20 mg by mouth 2 (two) times daily.  11/25/16  Yes [provider]  furosemide (LASIX) 20 MG tablet Take 20 mg by mouth daily as  needed for edema.   Yes [provider]  Hypromellose (ARTIFICIAL TEARS OP) Place 1 drop into both eyes daily as needed (dry eyes).   Yes [provider]  Ibuprofen-diphenhydrAMINE HCl (ADVIL PM) 200-25 MG CAPS Take 2 tablets by mouth at bedtime as needed (sleep).   Yes [provider]  levothyroxine (SYNTHROID, LEVOTHROID) 75 MCG tablet Take 75 mcg by mouth daily.    Yes [provider]  losartan (COZAAR) 100 MG tablet Take 100 mg by mouth daily.    Yes [provider]  lovastatin (MEVACOR) 20 MG tablet Take 20 mg by mouth 3 (three) times a week.   Yes [provider]  metFORMIN (GLUCOPHAGE-XR) 500 MG 24 hr tablet Take 500 mg by mouth daily.    Yes [provider]  naproxen sodium (ANAPROX) 220 MG tablet Take 220 mg by mouth 2 (two) times daily as needed (pain).    Yes [provider]  omeprazole (PRILOSEC) 40 MG capsule Take 40 mg by mouth daily.    Yes [provider]  PARoxetine (PAXIL) 40 MG tablet Take 40 mg by mouth every evening.  09/28/16  Yes [provider]  vitamin B-12 (CYANOCOBALAMIN) 1000 MCG tablet Take 2,500 mcg by mouth daily.    Yes [provider]  vitamin E 400 UNIT capsule Take 400 Units by mouth 4 (four) times a week.    Yes [provider]  conjugated estrogens (PREMARIN) vaginal cream Place 1 Applicatorful vaginally daily. Patient not taking: Reported on 05/19/2017 05/19/16   Zara Council A, PA-C  docusate sodium (COLACE) 100 MG capsule Take 1 capsule (100 mg total) by mouth 2 (two) times daily. 12/03/17   Duanne Guess, PA-C  enoxaparin (LOVENOX) 40 MG/0.4ML injection Inject 0.4 mLs (40 mg total) into the skin daily for 14 days. 12/03/17 12/17/17  Duanne Guess, PA-C  hydrALAZINE (APRESOLINE) 25 MG tablet Take 1 tablet (25 mg total) by mouth every 8 (eight) hours. Patient not taking: Reported on 11/14/2017 05/21/17   Demetrios Loll, MD  hydrochlorothiazide (HYDRODIURIL)  25 MG tablet Take 1 tablet (25 mg total) by mouth daily. Patient not taking: Reported on 11/14/2017 05/21/17   Demetrios Loll, MD  isosorbide mononitrate (IMDUR) 30 MG 24 hr tablet Take  1 tablet (30 mg total) by mouth daily. Patient not taking: Reported on 11/14/2017 05/22/17   Demetrios Loll, MD  oxyCODONE (OXY IR/ROXICODONE) 5 MG immediate release tablet Take 1-2 tablets (5-10 mg total) by mouth every 4 (four) hours as needed for moderate pain (pain score 4-6). 12/03/17   Duanne Guess, PA-C  solifenacin (VESICARE) 10 MG tablet Take 1 tablet (10 mg total) by mouth daily. Patient not taking: Reported on 05/19/2017 05/19/16   Zara Council A, PA-C    Anti-infectives (From admission, onward)   Start     Dose/Rate Route Frequency Ordered Stop   12/01/17 1500  clindamycin (CLEOCIN) IVPB 900 mg     900 mg 100 mL/hr over 30 Minutes Intravenous Every 6 hours 12/01/17 1259 12/02/17 0555   12/01/17 0700  clindamycin (CLEOCIN) IVPB 900 mg     900 mg 100 mL/hr over 30 Minutes Intravenous  Once 12/01/17 0656 12/01/17 0913   12/01/17 0654  clindamycin (CLEOCIN) 900 MG/50ML IVPB    Note to Pharmacy:  Myles Lipps   : cabinet override      12/01/17 0654 12/01/17 0903      Scheduled Meds: . amLODipine  10 mg Oral Q1400  . aspirin  81 mg Oral Daily  . busPIRone  20 mg Oral BID  . docusate sodium  100 mg Oral BID  . enoxaparin (LOVENOX) injection  40 mg Subcutaneous Q24H  . furosemide  20 mg Intravenous Once  . insulin aspart  0-15 Units Subcutaneous TID WC  . isosorbide mononitrate  30 mg Oral Daily  . levothyroxine  75 mcg Oral BH-q7a  . losartan  100 mg Oral Daily  . metFORMIN  500 mg Oral Q breakfast  . pantoprazole  40 mg Oral BID  . PARoxetine  40 mg Oral QPM  . pravastatin  20 mg Oral Q48H  . vitamin B-12  2,500 mcg Oral Daily  . vitamin E  400 Units Oral Once per day on Sun Tue Thu Sat   Continuous Infusions: . sodium chloride 75 mL/hr at 12/02/17 0605   PRN Meds:.acetaminophen,  bisacodyl, diphenhydrAMINE, furosemide, hydrALAZINE, HYDROmorphone (DILAUDID) injection, magnesium hydroxide, metoCLOPramide **OR** metoCLOPramide (REGLAN) injection, ondansetron **OR** ondansetron (ZOFRAN) IV, oxyCODONE, polyvinyl alcohol, sodium phosphate, traMADol  Allergies  Allergen Reactions  . Cephalexin Hives  . Nitrofurantoin     Other reaction(s): Other (See Comments) Other Reaction: measles-like lesions  . Sulfa Antibiotics Hives  . Atorvastatin Other (See Comments)    Muscle spasms    Physical Exam  Vitals  Blood pressure (!) 177/68, pulse 81, temperature 99.2 F (37.3 C), temperature source Axillary, resp. rate 18, height 5\' 6"  (1.676 m), weight 86.5 kg (190 lb 11.2 oz), SpO2 99 %.   1. General alert, awake, oriented.  Patient complains of left knee pain, states that pain medicines give her nausea. 2. Normal affect and insight, Not Suicidal or Homicidal, Awake Alert, Oriented X 3.  3. No F.N deficits, ALL C.Nerves Intact, Strength 5/5 all 4 extremities, Sensation intact all 4 extremities, Plantars down going.  4. Ears and Eyes appear Normal, Conjunctivae clear, PERRLA. Moist Oral Mucosa.  5. Supple Neck, No JVD, No cervical lymphadenopathy appriciated, No Carotid Bruits.  6. Symmetrical Chest wall movement, noted to have faint rales bilaterally.. 7. RRR, No Gallops, Rubs or Murmurs, No Parasternal Heave.  8. Positive Bowel Sounds, Abdomen Soft, No tenderness, No organomegaly appriciated,No rebound -guarding or rigidity.  9.  No Cyanosis, Normal Skin Turgor, No Skin Rash or Bruise.  Left knee connected to ice pack no swelling of both extremities.  10. Good muscle tone,  joints appear normal , no effusions, Normal ROM.  11. No Palpable Lymph Nodes in Neck or Axillae   Data Review  CBC Recent Labs  Lab 12/02/17 0424 12/03/17 0332  WBC 10.4 8.2  HGB 10.3* 10.2*  HCT 32.6* 31.5*  PLT 186 147*  MCV 85.5 85.1  MCH 26.9 27.5  MCHC 31.5* 32.3  RDW 16.5*  16.3*  LYMPHSABS 0.9*  --   MONOABS 0.8  --   EOSABS 0.0  --   BASOSABS 0.0  --    ------------------------------------------------------------------------------------------------------------------  Chemistries  Recent Labs  Lab 12/02/17 0424 12/03/17 0332  NA 137 135  K 4.2 3.7  CL 100* 98*  CO2 31 32  GLUCOSE 155* 148*  BUN 10 9  CREATININE 0.46 0.34*  CALCIUM 8.8* 9.0   ------------------------------------------------------------------------------------------------------------------ estimated creatinine clearance is 62.2 mL/min (A) (by C-G formula based on SCr of 0.34 mg/dL (L)). ------------------------------------------------------------------------------------------------------------------ No results for input(s): TSH, T4TOTAL, T3FREE, THYROIDAB in the last 72 hours.  Invalid input(s): FREET3   Coagulation profile No results for input(s): INR, PROTIME in the last 168 hours. ------------------------------------------------------------------------------------------------------------------- No results for input(s): DDIMER in the last 72 hours. -------------------------------------------------------------------------------------------------------------------  Cardiac Enzymes No results for input(s): CKMB, TROPONINI, MYOGLOBIN in the last 168 hours.  Invalid input(s): CK ------------------------------------------------------------------------------------------------------------------ Invalid input(s): POCBNP   ---------------------------------------------------------------------------------------------------------------  Urinalysis    Component Value Date/Time   COLORURINE AMBER (A) 11/23/2017 0916   APPEARANCEUR HAZY (A) 11/23/2017 0916   APPEARANCEUR Cloudy (A) 03/09/2017 1006   LABSPEC 1.026 11/23/2017 0916   LABSPEC 1.005 12/29/2013 0119   PHURINE 5.0 11/23/2017 0916   GLUCOSEU NEGATIVE 11/23/2017 0916   GLUCOSEU Negative 12/29/2013 0119   HGBUR NEGATIVE  11/23/2017 0916   BILIRUBINUR NEGATIVE 11/23/2017 0916   BILIRUBINUR Negative 03/09/2017 1006   BILIRUBINUR Negative 12/29/2013 0119   KETONESUR 5 (A) 11/23/2017 0916   PROTEINUR 30 (A) 11/23/2017 0916   NITRITE NEGATIVE 11/23/2017 0916   LEUKOCYTESUR SMALL (A) 11/23/2017 0916   LEUKOCYTESUR 1+ (A) 03/09/2017 1006   LEUKOCYTESUR 1+ 12/29/2013 0119     Imaging results:   Dg Knee Left Port  Result Date: 12/01/2017 CLINICAL DATA:  Post LEFT knee replacement EXAM: PORTABLE LEFT KNEE - 1-2 VIEW COMPARISON:  Portable exam 1132 hours compared to 01/06/2017 FINDINGS: Diffuse osseous demineralization. Components of a LEFT knee prosthesis are identified in expected position. No acute fracture, dislocation or bone destruction. Expected postsurgical changes of the anterior soft tissues of the LEFT knee. IMPRESSION: LEFT knee prosthesis without acute complication. Electronically Signed   By: Lavonia Dana M.D.   On: 12/01/2017 12:20      Assessment & Plan  Active Problems:   Status post total knee replacement using cement, left    1.  Essential hypertension: Uncontrolled, restarted all her BP medicines that she takes at home.  Continue Norvasc 10 mg daily at night, Imdur 30 mg p.o. daily, losartan 100 mg p.o. daily, if the BP is still high we can restart her hydralazine that she was on before.  Patient was on hydralazine 25 mg 3 times daily  #2 acute respiratory failure with hypoxia, patient not having any shortness of breath but she is hypoxic requiring 3 L of oxygen, saturations like 77% on room air.  I ordered chest x-ray stat portable, patient does have rales bilaterally so giving her 1 dose of IV Lasix.  Patient already on DVT prophylaxis Lovenox  40 mg subcu every 24 hours.  If patient is still hypoxic we will check CT angios chest.  Patient basic labs done this morning showed normal kidney function, normal white count.  3 ,diabetes mellitus type 2, controlled, continue present medication and  metformin, sliding scale insulin with coverage. GERD: Continue PPI 4.  Left knee the replacement, continue pain medicines, stool softeners, physical therapy as per the recommendation  AM Labs Ordered, also please review Full Orders  Family Communication: Plan discussed with patient and registered nurse.   Thank you for the consult, we will follow the patient with you in the Hospital. Time spent on this consult 77 minutes  Epifanio Lesches M.D on 12/03/2017 at 8:44 AM  Note: This dictation was prepared with Dragon dictation along with smaller phrase technology. Any transcriptional errors that result from this process are unintentional.

## 2017-12-03 NOTE — Clinical Social Work Note (Signed)
CSW has updated [facility] that the patient's blood pressure is elevated. [facility] is aware that the discharge plan is for tomorrow pending medical stability. CSW will continue to follow.  Santiago Bumpers, MSW, Latanya Presser [phone]

## 2017-12-03 NOTE — Plan of Care (Signed)
  Problem: Education: Goal: Knowledge of General Education information will improve Outcome: Progressing   Problem: Health Behavior/Discharge Planning: Goal: Ability to manage health-related needs will improve Outcome: Progressing   Problem: Clinical Measurements: Goal: Ability to maintain clinical measurements within normal limits will improve Outcome: Progressing Goal: Will remain free from infection Outcome: Progressing Goal: Diagnostic test results will improve Outcome: Progressing Goal: Respiratory complications will improve Outcome: Progressing Goal: Cardiovascular complication will be avoided Outcome: Progressing   Problem: Activity: Goal: Risk for activity intolerance will decrease Outcome: Progressing   Problem: Nutrition: Goal: Adequate nutrition will be maintained Outcome: Progressing   Problem: Coping: Goal: Level of anxiety will decrease Outcome: Progressing   Problem: Elimination: Goal: Will not experience complications related to bowel motility Outcome: Progressing Goal: Will not experience complications related to urinary retention Outcome: Progressing   Problem: Pain Managment: Goal: General experience of comfort will improve Outcome: Progressing   Problem: Safety: Goal: Ability to remain free from injury will improve Outcome: Progressing   Problem: Skin Integrity: Goal: Risk for impaired skin integrity will decrease Outcome: Progressing   Problem: Education: Goal: Knowledge of the prescribed therapeutic regimen will improve Outcome: Progressing   Problem: Activity: Goal: Ability to avoid complications of mobility impairment will improve Outcome: Progressing Goal: Range of joint motion will improve Outcome: Progressing   Problem: Clinical Measurements: Goal: Postoperative complications will be avoided or minimized Outcome: Progressing   Problem: Pain Management: Goal: Pain level will decrease with appropriate interventions Outcome:  Progressing   Problem: Skin Integrity: Goal: Signs of wound healing will improve Outcome: Progressing   

## 2017-12-04 LAB — GLUCOSE, CAPILLARY
GLUCOSE-CAPILLARY: 146 mg/dL — AB (ref 65–99)
Glucose-Capillary: 121 mg/dL — ABNORMAL HIGH (ref 65–99)
Glucose-Capillary: 88 mg/dL (ref 65–99)

## 2017-12-04 LAB — BASIC METABOLIC PANEL
Anion gap: 4 — ABNORMAL LOW (ref 5–15)
BUN: 14 mg/dL (ref 6–20)
CO2: 35 mmol/L — ABNORMAL HIGH (ref 22–32)
CREATININE: 0.56 mg/dL (ref 0.44–1.00)
Calcium: 9.2 mg/dL (ref 8.9–10.3)
Chloride: 94 mmol/L — ABNORMAL LOW (ref 101–111)
GFR calc Af Amer: >60 mL/min (ref >60)
Glucose, Bld: 129 mg/dL — ABNORMAL HIGH (ref 65–99)
Potassium: 4 mmol/L (ref 3.5–5.1)
SODIUM: 133 mmol/L — AB (ref 135–145)

## 2017-12-04 NOTE — Progress Notes (Signed)
[place] [facility] [place] NAME: Mary [name]    MR#:  [id number]  DATE OF BIRTH:  21-Sep-1934  SUBJECTIVE: Patient is seen at bedside, complains of left knee pain 7 out of 10 in severity.  No shortness of breath.  Blood pressure after giving morning BP medicines is 121/70.  It was high before giving the medication.  Hypoxia resolving, still needing 2 L of oxygen otherwise her saturation dropped to 86%.  CHIEF COMPLAINT:  No chief complaint on file.   REVIEW OF SYSTEMS:   ROS CONSTITUTIONAL: No fever, fatigue or weakness.  EYES: No blurred or double vision.  EARS, NOSE, AND THROAT: No tinnitus or ear pain.  RESPIRATORY: No cough, shortness of breath, wheezing or hemoptysis.  CARDIOVASCULAR: No chest pain, orthopnea, edema.  GASTROINTESTINAL: No nausea, vomiting, diarrhea or abdominal pain.  GENITOURINARY: No dysuria, hematuria.  ENDOCRINE: No polyuria, nocturia,  HEMATOLOGY: No anemia, easy bruising or bleeding SKIN: No rash or lesion. MUSCULOSKELETAL: left  knee pain nEUROLOGIC: No tingling, numbness, weakness.  PSYCHIATRY: No anxiety or depression.   DRUG ALLERGIES:   Allergies  Allergen Reactions  . Cephalexin Hives  . Nitrofurantoin     Other reaction(s): Other (See Comments) Other Reaction: measles-like lesions  . Sulfa Antibiotics Hives  . Atorvastatin Other (See Comments)    Muscle spasms    VITALS:  Blood pressure 121/70, pulse 76, temperature 98.6 F (37 C), temperature source Oral, resp. rate 18, height 5\' 6"  (1.676 m), weight 86.5 kg (190 lb 11.2 oz), SpO2 98 %.  PHYSICAL EXAMINATION:  GENERAL:  83 y.o.-year-old patient lying in the bed with no acute distress.  EYES: Pupils equal, round, reactive to light . No scleral icterus. Extraocular muscles intact.  HEENT: Head atraumatic, normocephalic. Oropharynx and nasopharynx clear.  NECK:  Supple, no jugular venous distention. No thyroid enlargement, no tenderness.   LUNGS: Normal breath sounds bilaterally, no wheezing, rales,rhonchi or crepitation. No use of accessory muscles of respiration.  CARDIOVASCULAR: S1, S2 normal. No murmurs, rubs, or gallops.  ABDOMEN: Soft, nontender, nondistended. Bowel sounds present. No organomegaly or mass.  EXTREMITIES: Polar ice pack present for the left knee. NEUROLOGIC: Cranial nerves II through XII are intact. Muscle strength 5/5 in all extremities. Sensation intact. Gait not checked.  PSYCHIATRIC: The patient is alert and oriented x 3.  SKIN: No obvious rash, lesion, or ulcer.    LABORATORY PANEL:   CBC Recent Labs  Lab 12/03/17 0332  WBC 8.2  HGB 10.2*  HCT 31.5*  PLT 147*   ------------------------------------------------------------------------------------------------------------------  Chemistries  Recent Labs  Lab 12/04/17 0322  NA 133*  K 4.0  CL 94*  CO2 35*  GLUCOSE 129*  BUN 14  CREATININE 0.56  CALCIUM 9.2   ------------------------------------------------------------------------------------------------------------------  Cardiac Enzymes No results for input(s): TROPONINI in the last 168 hours. ------------------------------------------------------------------------------------------------------------------  RADIOLOGY:  Dg Chest Port 1 View  Result Date: 12/03/2017 CLINICAL DATA:  83 year old female with a history of shortness of breath EXAM: PORTABLE CHEST 1 VIEW COMPARISON:  None. FINDINGS: Cardiomediastinal silhouette unchanged in size and contour. Low lung volumes persist with coarsened interstitial markings. No pneumothorax or pleural effusion. No confluent airspace disease. IMPRESSION: No evidence of acute cardiopulmonary disease Electronically Signed   By: Corrie Mckusick D.O.   On: 12/03/2017 09:08    EKG:   Orders placed or performed during the hospital encounter of 11/23/17  . EKG 12-Lead  . EKG 12-Lead    ASSESSMENT AND PLAN:  Transient hypoxia, postop hypoxia  improved after giving Lasix.  Unable to wean off oxygen.  Patient may have to go to predispose with oxygen.  She told me that when they did the right knee operation she went to [facility] and then she was on oxygen briefly and then taken off. 2.  Essential hypertension: Patient blood pressure was high last 2 days but it is better now, continue losartan 100 mg daily, Imdur 30 mg daily, Lasix 20 mg daily, patient medically stable for discharge. 3.  Left knee total hip replacement,; PPI high at times secondary to her left knee pain.  Control of left knee pain with tramadol oxycodone..     All the records are reviewed and case discussed with Care Management/Social Workerr. Management plans discussed with the patient, family and they are in agreement.  CODE STATUS: full( shesays she want to be DNR.)  TOTAL TIME TAKING CARE OF THIS PATIENT: 75minutes.   POSSIBLE D/C IN 1-2DAYS, DEPENDING ON CLINICAL CONDITION.   Epifanio Lesches M.D on 12/04/2017 at 11:48 AM  Between 7am to 6pm - Pager - [phone]  After 6pm go to [email or web address] - password [email or web address] [facility]  Office  [phone]  CC: Primary care physician; Dion Body, MD   Note: This dictation was prepared with Dragon dictation along with smaller phrase technology. Any transcriptional errors that result from this process are unintentional.

## 2017-12-04 NOTE — Progress Notes (Signed)
Pt  given PRN meds to have a bowel movement (see MAR), with no results. PA Rachelle Hora notified, per Gerald Stabs if pt does not have bowel movement, cancel discharge for today.

## 2017-12-04 NOTE — Progress Notes (Signed)
Pt had bowel movement, Report given to [facility] and EMS called for transportation.

## 2017-12-04 NOTE — Progress Notes (Signed)
Physical Therapy Treatment Patient Details Name: Mary [name] MRN: [id number] DOB: 1935/01/26 Today's Date: 12/04/2017    History of Present Illness 83 y/o female s/p L TKA 12/01/17 with significant post-op pain. PMH includes anxiety, depression, HTN, GERD, R THA and R TKA.     PT Comments    Patient performs bed mobility with min assist, transfers with RW with min assist and gait training with min assist and RW 30 feet with slow gait speed. She performs TKR exercises and her AROM is 0-70 deg left knee. She will continue to benefit from skilled PT to improve mobility and strength and return to PLOF.   Follow Up Recommendations  SNF     Equipment Recommendations  3in1 (PT)    Recommendations for Other Services       Precautions / Restrictions Precautions Precautions: Knee Precaution Booklet Issued: Yes (comment) Restrictions Weight Bearing Restrictions: Yes LLE Weight Bearing: Weight bearing as tolerated    Mobility  Bed Mobility Overal bed mobility: Needs Assistance Bed Mobility: Sit to Supine     Supine to sit: Min guard Sit to supine: Min assist   General bed mobility comments: labored in getting to sitting EOB, extra time and effort.  Pt did need light assist with LEs to get back up into bed.  Transfers Overall transfer level: Needs assistance Equipment used: Rolling walker (2 wheeled) Transfers: Sit to/from Stand Sit to Stand: Min assist         General transfer comment: Min assist from elevated bed height and heavy UE use.  Pt struggled to stand and needed direct assist to keep weight forward despite bed being elevated 2-3 inches.  Ambulation/Gait Ambulation/Gait assistance: Min assist Ambulation Distance (Feet): 30 Feet Assistive device: Rolling walker (2 wheeled) Gait Pattern/deviations: Step-to pattern Gait velocity: slow   General Gait Details: very slow gait, hesitant today and limited by pain.   Stairs             Wheelchair Mobility     Modified Rankin (Stroke Patients Only)       Balance Overall balance assessment: Needs assistance Sitting-balance support: No upper extremity supported Sitting balance-Leahy Scale: Good     Standing balance support: Bilateral upper extremity supported Standing balance-Leahy Scale: Fair                              Cognition Arousal/Alertness: Awake/alert Behavior During Therapy: WFL for tasks assessed/performed Overall Cognitive Status: Within Functional Limits for tasks assessed                                        Exercises Total Joint Exercises Ankle Circles/Pumps: Strengthening;10 reps Quad Sets: Strengthening;10 reps Gluteal Sets: Strengthening;10 reps Towel Squeeze: Both;10 reps Short Arc Quad: AROM;10 reps Heel Slides: AAROM;10 reps Hip ABduction/ADduction: Strengthening;AROM;10 reps Straight Leg Raises: AROM;10 reps Knee Flexion: 10 reps;AAROM;Seated Goniometric ROM: 0-70 deg    General Comments        Pertinent Vitals/Pain Pain Assessment: 0-10 Pain Score: 5  Pain Location: L knee Pain Descriptors / Indicators: Sore;Operative site guarding Pain Intervention(s): Limited activity within patient's tolerance    Home Living                      Prior Function            PT Goals (current  goals can now be found in the care plan section) Acute Rehab PT Goals Patient Stated Goal: Return to prior level of function at home PT Goal Formulation: With patient/family Time For Goal Achievement: 12/15/17 Potential to Achieve Goals: Good Progress towards PT goals: Progressing toward goals    Frequency    BID      PT Plan Current plan remains appropriate    Co-evaluation              AM-PAC PT "6 Clicks" Daily Activity  Outcome Measure  Difficulty turning over in bed (including adjusting bedclothes, sheets and blankets)?: A Little Difficulty moving from lying on back to sitting on the side of the bed? :  A Lot Difficulty sitting down on and standing up from a chair with arms (e.g., wheelchair, bedside commode, etc,.)?: Unable Help needed moving to and from a bed to chair (including a wheelchair)?: A Little Help needed walking in hospital room?: A Little Help needed climbing 3-5 steps with a railing? : Total 6 Click Score: 13    End of Session Equipment Utilized During Treatment: Oxygen;Gait belt   Patient left: in chair;with chair alarm set;with call bell/phone within reach Nurse Communication: Mobility status;Other (comment) PT Visit Diagnosis: Muscle weakness (generalized) (M62.81);Difficulty in walking, not elsewhere classified (R26.2);Pain Pain - Right/Left: Left Pain - part of body: Knee     Time: 1010-1050 PT Time Calculation (min) (ACUTE ONLY): 40 min  Charges:  $Gait Training: 8-22 mins $Therapeutic Exercise: 8-22 mins $Therapeutic Activity: 8-22 mins                    G Codes:  Functional Assessment Tool Used: AM-PAC 6 Clicks Basic Mobility;Clinical judgement Functional Limitation: Mobility: Walking and moving around Mobility: Walking and Moving Around Current Status (J6967): At least 60 percent but less than 80 percent impaired, limited or restricted Mobility: Walking and Moving Around Goal Status [phone]): At least 60 percent but less than 80 percent impaired, limited or restricted      Alanson Puls, PT DPT 12/04/2017, 1:03 PM

## 2017-12-04 NOTE — Progress Notes (Signed)
   Subjective: 3 Days Post-Op Procedure(s) (LRB): TOTAL KNEE ARTHROPLASTY (Left) Patient reports pain as moderate, severe with movement. Last dose of pain medicine 6pm yesterday. Patient is well, and has had no acute complaints or problems Denies any CP, SOB, ABD pain. We will continue therapy today.  Plan is to go Skilled nursing facility after hospital stay.  Objective: Vital signs in last 24 hours: Temp:  [98.6 F (37 C)-99.3 F (37.4 C)] 99.3 F (37.4 C) (04/14 0009) Pulse Rate:  [78-81] 78 (04/14 0009) Resp:  [18] 18 (04/14 0009) BP: (154-177)/(56-68) 166/66 (04/14 0009) SpO2:  [93 %-99 %] 97 % (04/14 0009)  Intake/Output from previous day: 04/13 0701 - 04/14 0700 In: 360 [P.O.:360] Out: 700 [Urine:700] Intake/Output this shift: Total I/O In: -  Out: 200 [Urine:200]  Recent Labs    12/02/17 0424 12/03/17 0332  HGB 10.3* 10.2*   Recent Labs    12/02/17 0424 12/03/17 0332  WBC 10.4 8.2  RBC 3.81 3.70*  HCT 32.6* 31.5*  PLT 186 147*   Recent Labs    12/03/17 0332 12/04/17 0322  NA 135 133*  K 3.7 4.0  CL 98* 94*  CO2 32 35*  BUN 9 14  CREATININE 0.34* 0.56  GLUCOSE 148* 129*  CALCIUM 9.0 9.2   No results for input(s): LABPT, INR in the last 72 hours.  EXAM General - Patient is Alert, Appropriate and Oriented Extremity - Neurovascular intact Sensation intact distally Intact pulses distally Dorsiflexion/Plantar flexion intact No cellulitis present Compartment soft Dressing - dressing C/D/I Motor Function - intact, moving foot and toes well on exam.   Past Medical History:  Diagnosis Date  . Acid reflux   . Arthritis   . Asthma   . Depression   . Diabetes ([place])   . Dysrhythmia   . Heart murmur   . HLD (hyperlipidemia)   . HTN (hypertension)   . Hypotension    when get up too quickly  . Sleep apnea   . Urinary urgency     Assessment/Plan:   3 Days Post-Op Procedure(s) (LRB): TOTAL KNEE ARTHROPLASTY (Left) Active Problems:  Status post total knee replacement using cement, left   Acute post op blood loss anemia with underlying chronic anemia   Hypertension  Estimated body mass index is 30.78 kg/m as calculated from the following:   Height as of this encounter: 5\' 6"  (1.676 m).   Weight as of this encounter: 86.5 kg (190 lb 11.2 oz). Advance diet Up with therapy  Needs BM Encouraged patient to take pain medications Acute post op blood loss anemia with underlying chronic anemia - Hgb 10.2, stable.  Hypertension -patient asymptomatic. BP still running a little High. 166/66.  Patient getting as needed hydralazine for systolic greater than 353.  Some of hypertension could be related to pain.  We will try to encourage patient to take pain medications regularly Hypoxia-patient still on 2 L via nasal cannula.  O2 sats 97%.  Patient without cough, shortness of breath. Possible discharge to rehab today pending medical clearance.  Patient also needs bowel movement.  DVT Prophylaxis - Lovenox, Foot Pumps and TED hose Weight-Bearing as tolerated to left leg   T. Rachelle Hora, PA-C [place] 12/04/2017, 6:33 AM

## 2017-12-04 NOTE — Clinical Social Work Note (Signed)
The patient will discharge to [facility] Room 802 via non-emergent EMS once she has had a successful BM. The patient and facility are aware and in agreement. The facility has received all needed discharge documentation, and the CSW has delivered the discharge packet. The CSW is signing off. Please consult should needs arise.  Santiago Bumpers, MSW, Latanya Presser [phone]

## 2017-12-04 NOTE — Progress Notes (Signed)
Family Meeting Note  Advance Directive:yes  Today a meeting took place with the Patient. And is able to participate in decision, she told me that she is 83 year old and had good life and wants to be DNR. The following clinical team members were present during this meeting:MD     Time spent during discussion:15 minutes  Epifanio Lesches, MD

## 2017-12-06 LAB — GLUCOSE, CAPILLARY
Glucose-Capillary: 193 mg/dL — ABNORMAL HIGH (ref 65–99)
Glucose-Capillary: 130 mg/dL — ABNORMAL HIGH (ref 65–99)

## 2018-06-13 DIAGNOSIS — Z862 Personal history of diseases of the blood and blood-forming organs and certain disorders involving the immune mechanism: Secondary | ICD-10-CM | POA: Insufficient documentation

## 2018-06-13 DIAGNOSIS — F5104 Psychophysiologic insomnia: Secondary | ICD-10-CM | POA: Insufficient documentation

## 2018-09-28 ENCOUNTER — Inpatient Hospital Stay: Payer: Medicare Other

## 2018-09-28 ENCOUNTER — Inpatient Hospital Stay
Admission: EM | Admit: 2018-09-28 | Discharge: 2018-10-02 | DRG: 470 | Disposition: A | Discharge status: Rehabilitation Facility | Payer: Medicare Other | Attending: Internal Medicine | Admitting: Internal Medicine

## 2018-09-28 ENCOUNTER — Emergency Department: Payer: Medicare Other

## 2018-09-28 ENCOUNTER — Other Ambulatory Visit: Payer: Self-pay

## 2018-09-28 DIAGNOSIS — Z66 Do not resuscitate: Secondary | ICD-10-CM | POA: Diagnosis present

## 2018-09-28 DIAGNOSIS — I10 Essential (primary) hypertension: Secondary | ICD-10-CM | POA: Diagnosis present

## 2018-09-28 DIAGNOSIS — S72002A Fracture of unspecified part of neck of left femur, initial encounter for closed fracture: Secondary | ICD-10-CM | POA: Diagnosis present

## 2018-09-28 DIAGNOSIS — I639 Cerebral infarction, unspecified: Secondary | ICD-10-CM | POA: Diagnosis present

## 2018-09-28 DIAGNOSIS — F329 Major depressive disorder, single episode, unspecified: Secondary | ICD-10-CM | POA: Diagnosis present

## 2018-09-28 DIAGNOSIS — I700 Atherosclerosis of aorta: Secondary | ICD-10-CM

## 2018-09-28 DIAGNOSIS — Z881 Allergy status to other antibiotic agents status: Secondary | ICD-10-CM | POA: Diagnosis not present

## 2018-09-28 DIAGNOSIS — R296 Repeated falls: Secondary | ICD-10-CM | POA: Diagnosis present

## 2018-09-28 DIAGNOSIS — E119 Type 2 diabetes mellitus without complications: Secondary | ICD-10-CM | POA: Diagnosis present

## 2018-09-28 DIAGNOSIS — K219 Gastro-esophageal reflux disease without esophagitis: Secondary | ICD-10-CM | POA: Diagnosis present

## 2018-09-28 DIAGNOSIS — G4730 Sleep apnea, unspecified: Secondary | ICD-10-CM | POA: Diagnosis present

## 2018-09-28 DIAGNOSIS — I6529 Occlusion and stenosis of unspecified carotid artery: Secondary | ICD-10-CM

## 2018-09-28 DIAGNOSIS — Z79890 Hormone replacement therapy (postmenopausal): Secondary | ICD-10-CM | POA: Diagnosis not present

## 2018-09-28 DIAGNOSIS — Z888 Allergy status to other drugs, medicaments and biological substances status: Secondary | ICD-10-CM

## 2018-09-28 DIAGNOSIS — Z90710 Acquired absence of both cervix and uterus: Secondary | ICD-10-CM | POA: Diagnosis not present

## 2018-09-28 DIAGNOSIS — Z79899 Other long term (current) drug therapy: Secondary | ICD-10-CM | POA: Diagnosis not present

## 2018-09-28 DIAGNOSIS — W010XXA Fall on same level from slipping, tripping and stumbling without subsequent striking against object, initial encounter: Secondary | ICD-10-CM | POA: Diagnosis present

## 2018-09-28 DIAGNOSIS — R42 Dizziness and giddiness: Secondary | ICD-10-CM | POA: Diagnosis present

## 2018-09-28 DIAGNOSIS — I6523 Occlusion and stenosis of bilateral carotid arteries: Secondary | ICD-10-CM | POA: Diagnosis not present

## 2018-09-28 DIAGNOSIS — R2681 Unsteadiness on feet: Secondary | ICD-10-CM | POA: Diagnosis not present

## 2018-09-28 DIAGNOSIS — R509 Fever, unspecified: Secondary | ICD-10-CM | POA: Diagnosis not present

## 2018-09-28 DIAGNOSIS — I739 Peripheral vascular disease, unspecified: Secondary | ICD-10-CM | POA: Diagnosis not present

## 2018-09-28 DIAGNOSIS — Z96652 Presence of left artificial knee joint: Secondary | ICD-10-CM | POA: Diagnosis present

## 2018-09-28 DIAGNOSIS — Z96649 Presence of unspecified artificial hip joint: Secondary | ICD-10-CM

## 2018-09-28 DIAGNOSIS — Z882 Allergy status to sulfonamides status: Secondary | ICD-10-CM

## 2018-09-28 DIAGNOSIS — E785 Hyperlipidemia, unspecified: Secondary | ICD-10-CM | POA: Diagnosis not present

## 2018-09-28 DIAGNOSIS — Z96651 Presence of right artificial knee joint: Secondary | ICD-10-CM | POA: Diagnosis present

## 2018-09-28 DIAGNOSIS — Y92009 Unspecified place in unspecified non-institutional (private) residence as the place of occurrence of the external cause: Secondary | ICD-10-CM

## 2018-09-28 DIAGNOSIS — E782 Mixed hyperlipidemia: Secondary | ICD-10-CM

## 2018-09-28 DIAGNOSIS — I361 Nonrheumatic tricuspid (valve) insufficiency: Secondary | ICD-10-CM | POA: Diagnosis not present

## 2018-09-28 DIAGNOSIS — K5900 Constipation, unspecified: Secondary | ICD-10-CM | POA: Diagnosis not present

## 2018-09-28 DIAGNOSIS — R002 Palpitations: Secondary | ICD-10-CM

## 2018-09-28 DIAGNOSIS — S72009A Fracture of unspecified part of neck of unspecified femur, initial encounter for closed fracture: Secondary | ICD-10-CM

## 2018-09-28 DIAGNOSIS — J45909 Unspecified asthma, uncomplicated: Secondary | ICD-10-CM | POA: Diagnosis present

## 2018-09-28 DIAGNOSIS — Z8249 Family history of ischemic heart disease and other diseases of the circulatory system: Secondary | ICD-10-CM | POA: Diagnosis not present

## 2018-09-28 LAB — CBC WITH DIFFERENTIAL/PLATELET
Abs Immature Granulocytes: 0.02 10*3/uL (ref 0.00–0.07)
BASOS ABS: 0 10*3/uL (ref 0.0–0.1)
Basophils Relative: 1 %
EOS PCT: 2 %
Eosinophils Absolute: 0.2 10*3/uL (ref 0.0–0.5)
HCT: 47.3 % — ABNORMAL HIGH (ref 36.0–46.0)
Hemoglobin: 11.5 g/dL — ABNORMAL LOW (ref 12.0–15.0)
Immature Granulocytes: 0 %
Lymphocytes Relative: 23 %
Lymphs Abs: 1.5 10*3/uL (ref 0.7–4.0)
MCH: 26.4 pg (ref 26.0–34.0)
MCHC: 24.3 g/dL — AB (ref 30.0–36.0)
MCV: 108.7 fL — ABNORMAL HIGH (ref 80.0–100.0)
Monocytes Absolute: 0.5 10*3/uL (ref 0.1–1.0)
Monocytes Relative: 8 %
NRBC: 0 % (ref 0.0–0.2)
Neutro Abs: 4.4 10*3/uL (ref 1.7–7.7)
Neutrophils Relative %: 66 %
Platelets: 195 10*3/uL (ref 150–400)
RBC: 4.35 MIL/uL (ref 3.87–5.11)
RDW: 15.7 % — AB (ref 11.5–15.5)
WBC: 6.7 10*3/uL (ref 4.0–10.5)

## 2018-09-28 LAB — TROPONIN I
Troponin I: <0.03 ng/mL (ref <0.03)
Troponin I: <0.03 ng/mL (ref <0.03)
Troponin I: <0.03 ng/mL (ref <0.03)

## 2018-09-28 LAB — BASIC METABOLIC PANEL
Anion gap: 7 (ref 5–15)
BUN: 16 mg/dL (ref 8–23)
CO2: 28 mmol/L (ref 22–32)
CREATININE: 0.62 mg/dL (ref 0.44–1.00)
Calcium: 9.2 mg/dL (ref 8.9–10.3)
Chloride: 102 mmol/L (ref 98–111)
Glucose, Bld: 145 mg/dL — ABNORMAL HIGH (ref 70–99)
Potassium: 4 mmol/L (ref 3.5–5.1)
Sodium: 137 mmol/L (ref 135–145)

## 2018-09-28 LAB — TYPE AND SCREEN
ABO/RH(D): B POS
Antibody Screen: NEGATIVE

## 2018-09-28 LAB — SURGICAL PCR SCREEN
MRSA, PCR: NEGATIVE
Staphylococcus aureus: NEGATIVE

## 2018-09-28 LAB — GLUCOSE, CAPILLARY: Glucose-Capillary: 154 mg/dL — ABNORMAL HIGH (ref 70–99)

## 2018-09-28 LAB — APTT: APTT: 35 s (ref 24–36)

## 2018-09-28 LAB — PROTIME-INR
INR: 0.93
Prothrombin Time: 12.4 seconds (ref 11.4–15.2)

## 2018-09-28 MED ORDER — SENNOSIDES-DOCUSATE SODIUM 8.6-50 MG PO TABS
1.0000 | ORAL_TABLET | Freq: Two times a day (BID) | ORAL | Status: DC
  Administered 2018-09-28 – 2018-10-02 (×6): 1 via ORAL
  Filled 2018-09-28 (×7): qty 1

## 2018-09-28 MED ORDER — HEPARIN SODIUM (PORCINE) 5000 UNIT/ML IJ SOLN: 5000.0000 [IU] | Freq: Three times a day (TID) | INTRAMUSCULAR | Status: DC

## 2018-09-28 MED ORDER — BUSPIRONE HCL 10 MG PO TABS
20.0000 mg | ORAL_TABLET | Freq: Two times a day (BID) | ORAL | Status: DC
  Administered 2018-09-28 – 2018-10-02 (×6): 20 mg via ORAL
  Filled 2018-09-28 (×6): qty 2

## 2018-09-28 MED ORDER — OXYCODONE-ACETAMINOPHEN 5-325 MG PO TABS
1.0000 | ORAL_TABLET | Freq: Four times a day (QID) | ORAL | Status: DC | PRN
  Administered 2018-09-28 – 2018-09-29 (×3): 1 via ORAL
  Filled 2018-09-28 (×3): qty 1

## 2018-09-28 MED ORDER — DOCUSATE SODIUM 100 MG PO CAPS: 100.0000 mg | ORAL_CAPSULE | Freq: Two times a day (BID) | ORAL | Status: DC | PRN

## 2018-09-28 MED ORDER — PAROXETINE HCL 20 MG PO TABS
40.0000 mg | ORAL_TABLET | Freq: Every evening | ORAL | Status: DC
  Administered 2018-09-28 – 2018-10-01 (×4): 40 mg via ORAL
  Filled 2018-09-28 (×4): qty 2

## 2018-09-28 MED ORDER — PANTOPRAZOLE SODIUM 40 MG PO TBEC
40.0000 mg | DELAYED_RELEASE_TABLET | Freq: Every day | ORAL | Status: DC
  Administered 2018-09-30 – 2018-10-02 (×3): 40 mg via ORAL
  Filled 2018-09-28 (×3): qty 1

## 2018-09-28 MED ORDER — MORPHINE SULFATE (PF) 2 MG/ML IV SOLN
INTRAVENOUS | Status: AC
  Filled 2018-09-28: qty 1

## 2018-09-28 MED ORDER — MORPHINE SULFATE (PF) 2 MG/ML IV SOLN
2.0000 mg | INTRAVENOUS | Status: DC | PRN
  Administered 2018-09-28 – 2018-09-29 (×4): 2 mg via INTRAVENOUS
  Filled 2018-09-28 (×4): qty 1

## 2018-09-28 MED ORDER — MORPHINE SULFATE (PF) 2 MG/ML IV SOLN
2.0000 mg | INTRAVENOUS | Status: DC | PRN
  Administered 2018-09-28: 2 mg via INTRAVENOUS

## 2018-09-28 MED ORDER — ONDANSETRON HCL 4 MG/2ML IJ SOLN
4.0000 mg | Freq: Four times a day (QID) | INTRAMUSCULAR | Status: DC
  Administered 2018-09-28 (×2): 4 mg via INTRAVENOUS
  Filled 2018-09-28: qty 2

## 2018-09-28 MED ORDER — LOSARTAN POTASSIUM 50 MG PO TABS
100.0000 mg | ORAL_TABLET | Freq: Every day | ORAL | Status: DC
  Administered 2018-09-28 – 2018-10-02 (×4): 100 mg via ORAL
  Filled 2018-09-28 (×4): qty 2

## 2018-09-28 MED ORDER — SODIUM CHLORIDE 0.9 % IV BOLUS
500.0000 mL | Freq: Once | INTRAVENOUS | Status: AC
  Administered 2018-09-28: 500 mL via INTRAVENOUS

## 2018-09-28 MED ORDER — ONDANSETRON HCL 4 MG/2ML IJ SOLN
INTRAMUSCULAR | Status: AC
  Filled 2018-09-28: qty 2

## 2018-09-28 MED ORDER — ACETAMINOPHEN 500 MG PO TABS: 500.0000 mg | ORAL_TABLET | Freq: Four times a day (QID) | ORAL | Status: DC | PRN

## 2018-09-28 MED ORDER — LEVOTHYROXINE SODIUM 50 MCG PO TABS
75.0000 ug | ORAL_TABLET | Freq: Every day | ORAL | Status: DC
  Administered 2018-09-30 – 2018-10-02 (×3): 75 ug via ORAL
  Filled 2018-09-28 (×3): qty 1

## 2018-09-28 MED ORDER — MUPIROCIN 2 % EX OINT
1.0000 "application " | TOPICAL_OINTMENT | Freq: Two times a day (BID) | CUTANEOUS | Status: DC
  Administered 2018-09-30 – 2018-10-02 (×4): 1 via NASAL
  Filled 2018-09-28: qty 22

## 2018-09-28 MED ORDER — DARIFENACIN HYDROBROMIDE ER 7.5 MG PO TB24
7.5000 mg | ORAL_TABLET | Freq: Every day | ORAL | Status: DC
  Administered 2018-09-30 – 2018-10-02 (×3): 7.5 mg via ORAL
  Filled 2018-09-28 (×4): qty 1

## 2018-09-28 MED ORDER — MORPHINE SULFATE (PF) 4 MG/ML IV SOLN
INTRAVENOUS | Status: AC
  Filled 2018-09-28: qty 1

## 2018-09-28 MED ORDER — AMLODIPINE BESYLATE 10 MG PO TABS
10.0000 mg | ORAL_TABLET | Freq: Every day | ORAL | Status: DC
  Administered 2018-09-28 – 2018-10-02 (×3): 10 mg via ORAL
  Filled 2018-09-28 (×3): qty 1

## 2018-09-28 MED ORDER — PRAVASTATIN SODIUM 20 MG PO TABS
20.0000 mg | ORAL_TABLET | Freq: Every day | ORAL | Status: DC
  Administered 2018-09-28 – 2018-10-01 (×4): 20 mg via ORAL
  Filled 2018-09-28 (×4): qty 1

## 2018-09-28 MED ORDER — HYDRALAZINE HCL 20 MG/ML IJ SOLN
10.0000 mg | Freq: Four times a day (QID) | INTRAMUSCULAR | Status: DC | PRN
  Administered 2018-09-28 – 2018-10-02 (×4): 10 mg via INTRAVENOUS
  Filled 2018-09-28: qty 0.5
  Filled 2018-09-28 (×4): qty 1

## 2018-09-28 MED ORDER — CLINDAMYCIN PHOSPHATE 900 MG/50ML IV SOLN
900.0000 mg | Freq: Once | INTRAVENOUS | Status: DC
  Filled 2018-09-28: qty 50

## 2018-09-28 MED ORDER — METFORMIN HCL ER 500 MG PO TB24
500.0000 mg | ORAL_TABLET | Freq: Every day | ORAL | Status: DC
  Administered 2018-09-30 – 2018-10-02 (×3): 500 mg via ORAL
  Filled 2018-09-28 (×4): qty 1

## 2018-09-28 NOTE — H&P (Signed)
[facility] [place] NAME: Mary [name]    MR#:  [id number]  DATE OF BIRTH:  Sep 04, 1934  DATE OF ADMISSION:  09/28/2018  PRIMARY CARE PHYSICIAN: Dion Body, MD   REQUESTING/REFERRING PHYSICIAN: Joni Fears  CHIEF COMPLAINT:   Chief Complaint  Patient presents with  . Fall    HISTORY OF PRESENT ILLNESS: Mary [name]  is a 84 y.o. female with a known history of acid reflux, asthma, depression, diabetes, hyperlipidemia, hypertension, sleep apnea-lives with her daughter at home and independent in day-to-day activity and walking. For last few weeks she had episodes where she feels dizzy which is not related to her position but usually happens when she is sitting or standing.  Also associated palpitation with these episodes but denies any chest pain or shortness of breath.  She feels she would pass out with this type of episodes but generally hold onto something and sit down and feels better within few seconds or minutes.  She never had actually loss of consciousness with these episodes so far.  She denies any tingling or numbness associated with these episodes. She also have feeling of shortness of breath with minimal exertion like walking from parking lot to the store when she go for grocery shopping needs and she has to take a break to catch her breath.  She denies any signs of orthopnea. Today morning while walking to bathroom she had 1 similar episode and she lost her balance and fell down while hitting her head to a chair.  She did not lose her consciousness.  But she was feeling significant pain in her left hip after this fall. In ER she was noted to have left hip fracture so given to hospitalist team for further management.   PAST MEDICAL HISTORY:   Past Medical History:  Diagnosis Date  . Acid reflux   . Arthritis   . Asthma   . Depression   . Diabetes ([place])   . Dysrhythmia   . Heart murmur   . HLD (hyperlipidemia)   . HTN  (hypertension)   . Hypotension    when get up too quickly  . Sleep apnea   . Urinary urgency     PAST SURGICAL HISTORY:  Past Surgical History:  Procedure Laterality Date  . ABDOMINAL HYSTERECTOMY    . CYSTOSCOPY    . HIP ARTHROPLASTY Right 09/24/2015   Procedure: ARTHROPLASTY BIPOLAR HIP (HEMIARTHROPLASTY);  Surgeon: Corky Mull, MD;  Location: [facility] ORS;  Service: Orthopedics;  Laterality: Right;  . JOINT REPLACEMENT     right hip  . JOINT REPLACEMENT     right knee  . KNEE ARTHROSCOPY W/ AUTOGENOUS CARTILAGE IMPLANTATION (ACI) PROCEDURE    . TOTAL KNEE ARTHROPLASTY Left 12/01/2017   Procedure: TOTAL KNEE ARTHROPLASTY;  Surgeon: Corky Mull, MD;  Location: [facility] ORS;  Service: Orthopedics;  Laterality: Left;  Marland Kitchen VARICOSE VEIN SURGERY    . VEIN SURGERY      SOCIAL HISTORY:  Social History   Tobacco Use  . Smoking status: Never Smoker  . Smokeless tobacco: Never Used  Substance Use Topics  . Alcohol use: No    Alcohol/week: 0.0 standard drinks    FAMILY HISTORY:  Family History  Problem Relation Age of Onset  . Kidney disease Brother        also nephew  . Heart disease Mother   . Heart disease Father   . Prostate cancer Neg Hx   . Bladder Cancer Neg Hx   .  Breast cancer Neg Hx   . Kidney cancer Neg Hx     DRUG ALLERGIES:  Allergies  Allergen Reactions  . Cephalexin Hives  . Nitrofurantoin     Other reaction(s): Other (See Comments) Other Reaction: measles-like lesions  . Sulfa Antibiotics Hives  . Atorvastatin Other (See Comments)    Muscle spasms/ muscle pain    REVIEW OF SYSTEMS:   CONSTITUTIONAL: No fever, fatigue or weakness.  EYES: No blurred or double vision.  EARS, NOSE, AND THROAT: No tinnitus or ear pain.  RESPIRATORY: No cough, shortness of breath, wheezing or hemoptysis.  CARDIOVASCULAR: No chest pain, orthopnea, edema.  GASTROINTESTINAL: No nausea, vomiting, diarrhea or abdominal pain.  GENITOURINARY: No dysuria, hematuria.  ENDOCRINE: No  polyuria, nocturia,  HEMATOLOGY: No anemia, easy bruising or bleeding SKIN: No rash or lesion. MUSCULOSKELETAL: Left hip pain. NEUROLOGIC: No tingling, numbness, weakness.  PSYCHIATRY: No anxiety or depression.   MEDICATIONS AT HOME:  Prior to Admission medications   Medication Sig Start Date End Date Taking? Authorizing Provider  acetaminophen (TYLENOL) 500 MG tablet Take 500 mg by mouth every 6 (six) hours as needed.   Yes [provider]  amLODipine (NORVASC) 10 MG tablet Take 10 mg by mouth daily at 2 PM.    Yes [provider]  busPIRone (BUSPAR) 10 MG tablet Take 20 mg by mouth 2 (two) times daily.  11/25/16  Yes [provider]  furosemide (LASIX) 20 MG tablet Take 20 mg by mouth daily as needed for edema.   Yes [provider]  Hypromellose (ARTIFICIAL TEARS OP) Place 1 drop into both eyes daily as needed (dry eyes).   Yes [provider]  levothyroxine (SYNTHROID, LEVOTHROID) 75 MCG tablet Take 75 mcg by mouth daily.    Yes [provider]  losartan (COZAAR) 100 MG tablet Take 100 mg by mouth daily.    Yes [provider]  lovastatin (MEVACOR) 20 MG tablet Take 20 mg by mouth 3 (three) times a week.   Yes [provider]  metFORMIN (GLUCOPHAGE-XR) 500 MG 24 hr tablet Take 500 mg by mouth daily.    Yes [provider]  omeprazole (PRILOSEC) 40 MG capsule Take 40 mg by mouth daily.    Yes [provider]  PARoxetine (PAXIL) 40 MG tablet Take 40 mg by mouth every evening.  09/28/16  Yes [provider]  solifenacin (VESICARE) 10 MG tablet Take 1 tablet (10 mg total) by mouth daily. 05/19/16  Yes McGowan, Larene Beach A, PA-C  vitamin E 400 UNIT capsule Take 400 Units by mouth 4 (four) times a week.    Yes [provider]      PHYSICAL EXAMINATION:   VITAL SIGNS: Blood pressure (!) 167/75, pulse 75, resp. rate 18, height 5\' 7"  (1.702 m), weight 79.4 kg, SpO2 96 %.  GENERAL:  84  y.o.-year-old patient lying in the bed with no acute distress.  EYES: Pupils equal, round, reactive to light and accommodation. No scleral icterus. Extraocular muscles intact.  HEENT: Head atraumatic, normocephalic. Oropharynx and nasopharynx clear.  NECK:  Supple, no jugular venous distention. No thyroid enlargement, no tenderness.  LUNGS: Normal breath sounds bilaterally, no wheezing, rales,rhonchi or crepitation. No use of accessory muscles of respiration.  CARDIOVASCULAR: S1, S2 normal. No murmurs, rubs, or gallops.  ABDOMEN: Soft, nontender, nondistended. Bowel sounds present. No organomegaly or mass.  EXTREMITIES: No pedal edema, cyanosis, or clubbing.  Left foot externally rotated and painful to move at her hip. NEUROLOGIC: Cranial  nerves II through XII are intact. Muscle strength 4/5 in all extremities-limited movements on left lower extremity due to pain.  Sensation intact. Gait not checked.  PSYCHIATRIC: The patient is alert and oriented x 3.  SKIN: No obvious rash, lesion, or ulcer.   LABORATORY PANEL:   CBC Recent Labs  Lab 09/28/18 0943  WBC 6.7  HGB 11.5*  HCT 47.3*  PLT 195  MCV 108.7*  MCH 26.4  MCHC 24.3*  RDW 15.7*  LYMPHSABS 1.5  MONOABS 0.5  EOSABS 0.2  BASOSABS 0.0   ------------------------------------------------------------------------------------------------------------------  Chemistries  Recent Labs  Lab 09/28/18 0943  NA 137  K 4.0  CL 102  CO2 28  GLUCOSE 145*  BUN 16  CREATININE 0.62  CALCIUM 9.2   ------------------------------------------------------------------------------------------------------------------ estimated creatinine clearance is 59.8 mL/min (by C-G formula based on SCr of 0.62 mg/dL). ------------------------------------------------------------------------------------------------------------------ No results for input(s): TSH, T4TOTAL, T3FREE, THYROIDAB in the last 72 hours.  Invalid input(s): FREET3   Coagulation  profile Recent Labs  Lab 09/28/18 1006  INR 0.93   ------------------------------------------------------------------------------------------------------------------- No results for input(s): DDIMER in the last 72 hours. -------------------------------------------------------------------------------------------------------------------  Cardiac Enzymes No results for input(s): CKMB, TROPONINI, MYOGLOBIN in the last 168 hours.  Invalid input(s): CK ------------------------------------------------------------------------------------------------------------------ Invalid input(s): POCBNP  ---------------------------------------------------------------------------------------------------------------  Urinalysis    Component Value Date/Time   COLORURINE AMBER (A) 11/23/2017 0916   APPEARANCEUR HAZY (A) 11/23/2017 0916   APPEARANCEUR Cloudy (A) 03/09/2017 1006   LABSPEC 1.026 11/23/2017 0916   LABSPEC 1.005 12/29/2013 0119   PHURINE 5.0 11/23/2017 0916   GLUCOSEU NEGATIVE 11/23/2017 0916   GLUCOSEU Negative 12/29/2013 0119   HGBUR NEGATIVE 11/23/2017 0916   BILIRUBINUR NEGATIVE 11/23/2017 0916   BILIRUBINUR Negative 03/09/2017 1006   BILIRUBINUR Negative 12/29/2013 0119   KETONESUR 5 (A) 11/23/2017 0916   PROTEINUR 30 (A) 11/23/2017 0916   NITRITE NEGATIVE 11/23/2017 0916   LEUKOCYTESUR SMALL (A) 11/23/2017 0916   LEUKOCYTESUR 1+ (A) 03/09/2017 1006   LEUKOCYTESUR 1+ 12/29/2013 0119     RADIOLOGY: Dg Chest 1 View  Result Date: 09/28/2018 CLINICAL DATA:  Pt arrived from home via [facility]. Per EMS pt fell at home, c/o left hip pain; left leg foreshortened; Pre op; HTN, diabetes, asthma, nonsmoker EXAM: CHEST  1 VIEW COMPARISON:  12/03/2017 FINDINGS: Heart size is accentuated by the supine technique. There are no focal consolidations or pleural effusions. No pulmonary edema. Note is made of LEFT mid lung zone subsegmental atelectasis or scarring. No pneumothorax or acute rib fractures.  IMPRESSION: 1. No evidence for acute abnormality. 2. No acute rib fracture or bone destruction. Electronically Signed   By: Nolon Nations M.D.   On: 09/28/2018 10:16   Ct Head Wo Contrast  Result Date: 09/28/2018 CLINICAL DATA:  Mary [name] is a 84 y.o. female with a history of hypertension diabetes prior right hip prosthesis, left total knee replacement April 2019 and episodic dizziness who reports that this morning when she was standing up she current dizzy and fell over on her LEFT side. Patient hit LEFT side of the head. No headache or vision changes. EXAM: CT HEAD WITHOUT CONTRAST TECHNIQUE: Contiguous axial images were obtained from the base of the skull through the vertex without intravenous contrast. COMPARISON:  05/19/2017 FINDINGS: Brain: There is central and cortical atrophy. Periventricular white matter changes are consistent with small vessel disease. There is no intra or extra-axial fluid collection or mass lesion. The basilar cisterns and ventricles have a normal appearance. There is no CT evidence for  acute infarction or hemorrhage. Vascular: There is atherosclerotic calcification of the internal carotid arteries. No hyperdense vessels. Skull: Normal. Negative for fracture or focal lesion. Sinuses/Orbits: No acute finding. Other: No focal scalp edema. IMPRESSION: 1. No evidence for acute intracranial abnormality. 2. Atrophy and small vessel disease. Electronically Signed   By: Nolon Nations M.D.   On: 09/28/2018 12:21   Dg Hip Unilat W Or Wo Pelvis 2-3 Views Left  Result Date: 09/28/2018 CLINICAL DATA:  Pt arrived from home via [facility]. Per EMS pt fell at home, c/o left hip pain; left leg foreshortened; EXAM: DG HIP (WITH OR WITHOUT PELVIS) 2-3V LEFT COMPARISON:  09/24/2015 FINDINGS: The patient has had a hemiarthroplasty of the RIGHT hip. There is an acute fracture of the LEFT femoral neck, associated with varus angulation. No dislocation or subluxation. Remainder of the pelvis is intact.  IMPRESSION: 1. Acute fracture of the LEFT femoral neck. 2. RIGHT hip hardware intact. Electronically Signed   By: Nolon Nations M.D.   On: 09/28/2018 10:15    EKG: Orders placed or performed during the hospital encounter of 09/28/18  . ED EKG  . ED EKG    IMPRESSION AND PLAN:  *Left hip fracture initial encounter. Orthopedic team is aware. Patient has symptoms of dizziness and palpitation along with shortness of breath on minimal exertion are suggestive of A. fib or CHF. I have called cardiology consult to get clearance and ordered echocardiogram on her. Meanwhile will do cardiac monitor.  *Fall with dizziness We will keep on cardiac monitor possibly it appears to be cardiac origin. Cardiology consult.  *Head injury She hit her head to the chair.  Does not have any focal symptoms currently but I would get a CT scan of the head.  Which will also help with her dizziness symptoms to rule out any intracranial pathology.  *Uncontrolled hypertension Continue amlodipine, losartan, give hydralazine as needed.  *Hyperlipidemia Continue lovastatin.  All the records are reviewed and case discussed with ED provider. Management plans discussed with the patient, family and they are in agreement.  CODE STATUS: DNR. Code Status History    Date Active Date Inactive Code Status Order ID Comments User Context   12/01/2017 1259 12/05/2017 0402 Full Code [id number]  Corky Mull, MD Inpatient   05/19/2017 2114 05/21/2017 1555 Full Code [id number]  Henreitta Leber, MD Inpatient   09/27/2015 0104 09/27/2015 1749 Full Code [id number]  Saundra Shelling, MD Inpatient   09/24/2015 0340 09/26/2015 2015 Full Code [id number]  Lance Coon, MD Inpatient       TOTAL TIME TAKING CARE OF THIS PATIENT: 50 minutes.    Vaughan Basta M.D on 09/28/2018   Between 7am to 6pm - Pager - [phone]  After 6pm go to [email or web address] - password [email or web address] [place] [facility] [facility]  Office   [phone]  CC: Primary care physician; Dion Body, MD   Note: This dictation was prepared with Dragon dictation along with smaller phrase technology. Any transcriptional errors that result from this process are unintentional.

## 2018-09-28 NOTE — Consult Note (Signed)
Cardiology Consultation:   Patient ID: ANNEKA STUDER MRN: [id number]; DOB: 1935/06/25  Admit date: 09/28/2018 Date of Consult: 09/28/2018  Primary Care Provider: Dion Body, MD Primary Cardiologist: New to [facility] Reason for consult: Shortness of breath, dizziness, palpitations Physician requesting consult: Dr. Anselm Jungling   Patient Profile:   TIJA BISS is a 84 y.o. female with a hx of gait instability, falls, diabetes poorly controlled hypertension, hyperlipidemia, presenting after a fall, suffering hip fracture.  Cardiology consulted for symptoms of dizziness when standing, palpitations  History of Present Illness:   Ms. Flott reports frequent falls She had a fall with right hip fracture requiring surgery February 2017 He also underwent total knee arthroplasty April 2019 Not very active at baseline, walks with a walker Reports over the past 2 months having symptoms of dizziness after she has been standing for prolonged period of time.  Feels like she is "going to go backwards".  One episode standing at the stove cooking and after prolonged period of time felt like she was going backwards had to go sit down for 30 minutes until she recovered her strength, reported having dizziness leading to her needing to sit down.  She has had other episodes such as while standing in line shopping.  Checkout clerk told her to sit down and recover  Denies having prior work-up No prior monitor, echocardiogram  Prior CT scan 2017 with mild aortic atherosclerosis, mild coronary calcification  Carotid ultrasound this admission with 70% stenosis on the right Less than 50% disease on the left   Past Medical History:  Diagnosis Date  . Acid reflux   . Arthritis   . Asthma   . Depression   . Diabetes ([place])   . Dysrhythmia   . Heart murmur   . HLD (hyperlipidemia)   . HTN (hypertension)   . Hypotension    when get up too quickly  . Sleep apnea   . Urinary urgency     Past Surgical  History:  Procedure Laterality Date  . ABDOMINAL HYSTERECTOMY    . CYSTOSCOPY    . HIP ARTHROPLASTY Right 09/24/2015   Procedure: ARTHROPLASTY BIPOLAR HIP (HEMIARTHROPLASTY);  Surgeon: Corky Mull, MD;  Location: [facility] ORS;  Service: Orthopedics;  Laterality: Right;  . JOINT REPLACEMENT     right hip  . JOINT REPLACEMENT     right knee  . KNEE ARTHROSCOPY W/ AUTOGENOUS CARTILAGE IMPLANTATION (ACI) PROCEDURE    . TOTAL KNEE ARTHROPLASTY Left 12/01/2017   Procedure: TOTAL KNEE ARTHROPLASTY;  Surgeon: Corky Mull, MD;  Location: [facility] ORS;  Service: Orthopedics;  Laterality: Left;  Marland Kitchen VARICOSE VEIN SURGERY    . VEIN SURGERY       Home Medications:  Prior to Admission medications   Medication Sig Start Date End Date Taking? Authorizing Provider  acetaminophen (TYLENOL) 500 MG tablet Take 500 mg by mouth every 6 (six) hours as needed.   Yes [provider]  amLODipine (NORVASC) 10 MG tablet Take 10 mg by mouth daily at 2 PM.    Yes [provider]  busPIRone (BUSPAR) 10 MG tablet Take 20 mg by mouth 2 (two) times daily.  11/25/16  Yes [provider]  furosemide (LASIX) 20 MG tablet Take 20 mg by mouth daily as needed for edema.   Yes [provider]  Hypromellose (ARTIFICIAL TEARS OP) Place 1 drop into both eyes daily as needed (dry eyes).   Yes [provider]  levothyroxine (SYNTHROID, LEVOTHROID) 75 MCG tablet Take  75 mcg by mouth daily.    Yes [provider]  losartan (COZAAR) 100 MG tablet Take 100 mg by mouth daily.    Yes [provider]  lovastatin (MEVACOR) 20 MG tablet Take 20 mg by mouth 3 (three) times a week.   Yes [provider]  metFORMIN (GLUCOPHAGE-XR) 500 MG 24 hr tablet Take 500 mg by mouth daily.    Yes [provider]  omeprazole (PRILOSEC) 40 MG capsule Take 40 mg by mouth daily.    Yes [provider]  PARoxetine (PAXIL) 40 MG tablet Take 40 mg by mouth every evening.  09/28/16  Yes  [provider]  solifenacin (VESICARE) 10 MG tablet Take 1 tablet (10 mg total) by mouth daily. 05/19/16  Yes McGowan, Larene Beach A, PA-C  vitamin E 400 UNIT capsule Take 400 Units by mouth 4 (four) times a week.    Yes [provider]    Inpatient Medications: Scheduled Meds: . amLODipine  10 mg Oral Q1400  . busPIRone  20 mg Oral BID  . [START ON 09/29/2018] darifenacin  7.5 mg Oral Daily  . [START ON 09/29/2018] levothyroxine  75 mcg Oral Daily  . losartan  100 mg Oral Daily  . [START ON 09/29/2018] metFORMIN  500 mg Oral Q breakfast  . ondansetron (ZOFRAN) IV  4 mg Intravenous Q6H  . [START ON 09/29/2018] pantoprazole  40 mg Oral Daily  . PARoxetine  40 mg Oral QPM  . pravastatin  20 mg Oral q1800  . senna-docusate  1 tablet Oral BID   Continuous Infusions: . clindamycin (CLEOCIN) IV     PRN Meds: acetaminophen, docusate sodium, hydrALAZINE, morphine injection, oxyCODONE-acetaminophen  Allergies:    Allergies  Allergen Reactions  . Cephalexin Hives  . Nitrofurantoin     Other reaction(s): Other (See Comments) Other Reaction: measles-like lesions  . Sulfa Antibiotics Hives  . Atorvastatin Other (See Comments)    Muscle spasms/ muscle pain    Social History:   Social History   Socioeconomic History  . Marital status: Widowed    Spouse name: Not on file  . Number of children: Not on file  . Years of education: Not on file  . Highest education level: Not on file  Occupational History  . Occupation: retired  Scientific laboratory technician  . Financial resource strain: Not on file  . Food insecurity:    Worry: Not on file    Inability: Not on file  . Transportation needs:    Medical: Not on file    Non-medical: Not on file  Tobacco Use  . Smoking status: Never Smoker  . Smokeless tobacco: Never Used  Substance and Sexual Activity  . Alcohol use: No    Alcohol/week: 0.0 standard drinks  . Drug use: No  . Sexual activity: Not on file  Lifestyle  . Physical activity:      Days per week: Not on file    Minutes per session: Not on file  . Stress: Not on file  Relationships  . Social connections:    Talks on phone: Not on file    Gets together: Not on file    Attends religious service: Not on file    Active member of club or organization: Not on file    Attends meetings of clubs or organizations: Not on file    Relationship status: Not on file  . Intimate partner violence:    Fear of current or ex partner: Not on file  Emotionally abused: Not on file    Physically abused: Not on file    Forced sexual activity: Not on file  Other Topics Concern  . Not on file  Social History Narrative   Currently resides [facility]    Family History:    Family History  Problem Relation Age of Onset  . Kidney disease Brother        also nephew  . Heart disease Mother   . Heart disease Father   . Prostate cancer Neg Hx   . Bladder Cancer Neg Hx   . Breast cancer Neg Hx   . Kidney cancer Neg Hx      ROS:  Please see the history of present illness.  Review of Systems  Constitutional: Negative.   Respiratory: Negative.        Shortness of breath if she walks too far  Cardiovascular: Negative.   Gastrointestinal: Negative.   Musculoskeletal: Positive for falls.       Gait instability, left hip pain  Neurological: Positive for dizziness.  Psychiatric/Behavioral: Negative.   All other systems reviewed and are negative.   Physical Exam/Data:   Vitals:   09/28/18 0941 09/28/18 1230 09/28/18 1604 09/28/18 1742  BP:  (!) 167/75 (!) 186/72 (!) 196/78  Pulse:  75 83 81  Resp:  18  16  Temp:    98.2 F (36.8 C)  SpO2:  96% 99% 98%  Weight: 79.4 kg     Height: 5\' 7"  (1.702 m)      No intake or output data in the 24 hours ending 09/28/18 1854 Last 3 Weights 09/28/2018 12/01/2017 11/23/2017  Weight (lbs) 175 lb 190 lb 11.2 oz 190 lb  Weight (kg) 79.379 kg 86.5 kg 86.183 kg     Body mass index is 27.41 kg/m.  General:  Well nourished,  well developed, in no acute distress, supine in bed HEENT: normal Lymph: no adenopathy Neck: no JVD Endocrine:  No thryomegaly Vascular: No carotid bruits; FA pulses 2+ bilaterally without bruits  Cardiac:  normal S1, S2; RRR; 1-2 over 6 systolic ejection murmur appreciated right sternal border Lungs:  clear to auscultation bilaterally, no wheezing, rhonchi or rales  Abd: soft, nontender, no hepatomegaly  Ext: no edema Musculoskeletal:  No deformities, BUE and BLE strength normal and equal Skin: warm and dry  Neuro:  CNs 2-12 intact, no focal abnormalities noted Psych:  Normal affect   EKG:  The EKG was personally reviewed and demonstrates: Normal sinus rhythm no significant ST-T wave changes Telemetry:  Telemetry was personally reviewed and demonstrates: Normal sinus rhythm  Relevant CV Studies: Echocardiogram pending  Laboratory Data:  Chemistry Recent Labs  Lab 09/28/18 0943  NA 137  K 4.0  CL 102  CO2 28  GLUCOSE 145*  BUN 16  CREATININE 0.62  CALCIUM 9.2  GFRNONAA >60  GFRAA >60  ANIONGAP 7    No results for input(s): PROT, ALBUMIN, AST, ALT, ALKPHOS, BILITOT in the last 168 hours. Hematology Recent Labs  Lab 09/28/18 0943  WBC 6.7  RBC 4.35  HGB 11.5*  HCT 47.3*  MCV 108.7*  MCH 26.4  MCHC 24.3*  RDW 15.7*  PLT 195   Cardiac Enzymes Recent Labs  Lab 09/28/18 1106 09/28/18 1746  TROPONINI <0.03 <0.03   No results for input(s): TROPIPOC in the last 168 hours.  BNPNo results for input(s): BNP, PROBNP in the last 168 hours.  DDimer No results for input(s): DDIMER in the last 168  hours.  Radiology/Studies:  Dg Chest 1 View  Result Date: 09/28/2018 CLINICAL DATA:  Pt arrived from home via [facility]. Per EMS pt fell at home, c/o left hip pain; left leg foreshortened; Pre op; HTN, diabetes, asthma, nonsmoker EXAM: CHEST  1 VIEW COMPARISON:  12/03/2017 FINDINGS: Heart size is accentuated by the supine technique. There are no focal consolidations or pleural  effusions. No pulmonary edema. Note is made of LEFT mid lung zone subsegmental atelectasis or scarring. No pneumothorax or acute rib fractures. IMPRESSION: 1. No evidence for acute abnormality. 2. No acute rib fracture or bone destruction. Electronically Signed   By: Nolon Nations M.D.   On: 09/28/2018 10:16   Ct Head Wo Contrast  Result Date: 09/28/2018 CLINICAL DATA:  LASHARON DUNIVAN is a 84 y.o. female with a history of hypertension diabetes prior right hip prosthesis, left total knee replacement April 2019 and episodic dizziness who reports that this morning when she was standing up she current dizzy and fell over on her LEFT side. Patient hit LEFT side of the head. No headache or vision changes. EXAM: CT HEAD WITHOUT CONTRAST TECHNIQUE: Contiguous axial images were obtained from the base of the skull through the vertex without intravenous contrast. COMPARISON:  05/19/2017 FINDINGS: Brain: There is central and cortical atrophy. Periventricular white matter changes are consistent with small vessel disease. There is no intra or extra-axial fluid collection or mass lesion. The basilar cisterns and ventricles have a normal appearance. There is no CT evidence for acute infarction or hemorrhage. Vascular: There is atherosclerotic calcification of the internal carotid arteries. No hyperdense vessels. Skull: Normal. Negative for fracture or focal lesion. Sinuses/Orbits: No acute finding. Other: No focal scalp edema. IMPRESSION: 1. No evidence for acute intracranial abnormality. 2. Atrophy and small vessel disease. Electronically Signed   By: Nolon Nations M.D.   On: 09/28/2018 12:21   US Carotid Bilateral  Result Date: 09/28/2018 CLINICAL DATA:  CVA EXAM: BILATERAL CAROTID DUPLEX ULTRASOUND TECHNIQUE: Pearline Cables scale imaging, color Doppler and duplex ultrasound were performed of bilateral carotid and vertebral arteries in the neck. COMPARISON:  None. FINDINGS: Criteria: Quantification of carotid stenosis is based  on velocity parameters that correlate the residual internal carotid diameter with NASCET-based stenosis levels, using the diameter of the distal internal carotid lumen as the denominator for stenosis measurement. The following velocity measurements were obtained: RIGHT ICA: 260 cm/sec CCA: 80 cm/sec SYSTOLIC ICA/CCA RATIO:  3.3 ECA: 178 cm/sec LEFT ICA: 106 cm/sec CCA: 962 cm/sec SYSTOLIC ICA/CCA RATIO:  1.0 ECA: 118 cm/sec RIGHT CAROTID ARTERY: There is prominent focal calcified plaque in the bulb with obvious luminal narrowing. Low resistance internal carotid Doppler pattern is preserved. RIGHT VERTEBRAL ARTERY:  Antegrade. LEFT CAROTID ARTERY: Mild smooth soft and calcified plaque in the bulb. Low resistance internal carotid Doppler pattern is preserved. There is moderate plaque at the proximal external carotid artery. LEFT VERTEBRAL ARTERY:  Antegrade IMPRESSION: Greater than 70% stenosis in the right internal carotid artery. Less than 50% stenosis in the left internal carotid artery. Antegrade flow in the right and left vertebral arteries. Electronically Signed   By: Marybelle Killings M.D.   On: 09/28/2018 14:02   Dg Hip Unilat W Or Wo Pelvis 2-3 Views Left  Result Date: 09/28/2018 CLINICAL DATA:  Pt arrived from home via [facility]. Per EMS pt fell at home, c/o left hip pain; left leg foreshortened; EXAM: DG HIP (WITH OR WITHOUT PELVIS) 2-3V LEFT COMPARISON:  09/24/2015 FINDINGS: The patient has had a hemiarthroplasty  of the RIGHT hip. There is an acute fracture of the LEFT femoral neck, associated with varus angulation. No dislocation or subluxation. Remainder of the pelvis is intact. IMPRESSION: 1. Acute fracture of the LEFT femoral neck. 2. RIGHT hip hardware intact. Electronically Signed   By: Nolon Nations M.D.   On: 09/28/2018 10:15    Assessment and Plan:   1. Preop cardiovascular evaluation Atypical symptoms of dizziness, Some palpitations but atypical in nature Shortness of breath on exertion with  unclear etiology, unable to exclude deconditioning Frequent falls likely secondary to gait instability /leg weakness -No high risk features that would delay hip surgery tomorrow -If possible will try to obtain echocardiogram prior to procedure for baseline study though this should not delay surgical repair/hip replacement with Dr. Roland Rack -He is on telemetry to rule out arrhythmia If no significant arrhythmia, echocardiogram relatively benign, would recommend outpatient work-up  2) hypertension Per the family this is chronically elevated Would restart her amlodipine and losartan as she has not had medications today -If blood pressure continues to run high,  She would benefit from additional medication titration -Consider adding clonidine 0.1 mg twice daily  3) PAD/carotid stenosis Medical management at this time Statin, goal LDL less than 70 Outpatient follow-up with vascular surgery  Case discussed with hospitalist service and with Dr. Roland Rack  Total encounter time more than 110 minutes  Greater than 50% was spent in counseling and coordination of care with the patient    For questions or updates, please contact [facility] Please consult [email or web address] for contact info under     Signed, Ida Rogue, MD  09/28/2018 6:54 PM

## 2018-09-28 NOTE — Consult Note (Signed)
ORTHOPAEDIC CONSULTATION  REQUESTING PHYSICIAN: Vaughan Basta, *  Chief Complaint:   Left hip pain.  History of Present Illness: Mary [name] is a 84 y.o. female with a history of multiple medical problems including diabetes, hypertension, hyperlipidemia, sleep apnea, depression, asthma, acid reflux, and dysrhythmias who was in her usual state of health this morning.  Apparently she got up too quickly, became dizzy, and fell onto her left side.  She was brought to the emergency room where x-rays demonstrated a displaced left femoral neck fracture.  She has been admitted in preparation for definitive management of this injury.  The patient notes that she was dizzy, which caused her to fall.  She states that she has been getting dizzy more frequently.  She did strike her head but denies any headaches, visual disturbances, or other symptoms emanating from her head.  She denies any chest pain, shortness of breath, or other cardiopulmonary symptoms.  She is status post a prior right hip hemiarthroplasty in February, 2017, and underwent a left total knee arthroplasty in April, 2019.  Past Medical History:  Diagnosis Date  . Acid reflux   . Arthritis   . Asthma   . Depression   . Diabetes ([place])   . Dysrhythmia   . Heart murmur   . HLD (hyperlipidemia)   . HTN (hypertension)   . Hypotension    when get up too quickly  . Sleep apnea   . Urinary urgency    Past Surgical History:  Procedure Laterality Date  . ABDOMINAL HYSTERECTOMY    . CYSTOSCOPY    . HIP ARTHROPLASTY Right 09/24/2015   Procedure: ARTHROPLASTY BIPOLAR HIP (HEMIARTHROPLASTY);  Surgeon: Corky Mull, MD;  Location: [facility] ORS;  Service: Orthopedics;  Laterality: Right;  . JOINT REPLACEMENT     right hip  . JOINT REPLACEMENT     right knee  . KNEE ARTHROSCOPY W/ AUTOGENOUS CARTILAGE IMPLANTATION (ACI) PROCEDURE    . TOTAL KNEE ARTHROPLASTY Left 12/01/2017   Procedure: TOTAL KNEE ARTHROPLASTY;  Surgeon: Corky Mull, MD;  Location: [facility] ORS;  Service: Orthopedics;  Laterality: Left;  Marland Kitchen VARICOSE VEIN SURGERY    . VEIN SURGERY     Social History   Socioeconomic History  . Marital status: Widowed    Spouse name: Not on file  . Number of children: Not on file  . Years of education: Not on file  . Highest education level: Not on file  Occupational History  . Occupation: retired  Scientific laboratory technician  . Financial resource strain: Not on file  . Food insecurity:    Worry: Not on file    Inability: Not on file  . Transportation needs:    Medical: Not on file    Non-medical: Not on file  Tobacco Use  . Smoking status: Never Smoker  . Smokeless tobacco: Never Used  Substance and Sexual Activity  . Alcohol use: No    Alcohol/week: 0.0 standard drinks  . Drug use: No  . Sexual activity: Not on file  Lifestyle  . Physical activity:    Days per week: Not on file    Minutes per session: Not on file  . Stress: Not on file  Relationships  . Social connections:    Talks on phone: Not on file    Gets together: Not on file    Attends religious service: Not on file    Active member of club or organization: Not on file    Attends meetings of clubs or organizations:  Not on file    Relationship status: Not on file  Other Topics Concern  . Not on file  Social History Narrative   Currently resides at [facility]   Family History  Problem Relation Age of Onset  . Kidney disease Brother        also nephew  . Heart disease Mother   . Heart disease Father   . Prostate cancer Neg Hx   . Bladder Cancer Neg Hx   . Breast cancer Neg Hx   . Kidney cancer Neg Hx    Allergies  Allergen Reactions  . Cephalexin Hives  . Nitrofurantoin     Other reaction(s): Other (See Comments) Other Reaction: measles-like lesions  . Sulfa Antibiotics Hives  . Atorvastatin Other (See Comments)    Muscle spasms/ muscle pain   Prior to Admission  medications   Medication Sig Start Date End Date Taking? Authorizing Provider  acetaminophen (TYLENOL) 500 MG tablet Take 500 mg by mouth every 6 (six) hours as needed.   Yes [provider]  amLODipine (NORVASC) 10 MG tablet Take 10 mg by mouth daily at 2 PM.    Yes [provider]  busPIRone (BUSPAR) 10 MG tablet Take 20 mg by mouth 2 (two) times daily.  11/25/16  Yes [provider]  furosemide (LASIX) 20 MG tablet Take 20 mg by mouth daily as needed for edema.   Yes [provider]  Hypromellose (ARTIFICIAL TEARS OP) Place 1 drop into both eyes daily as needed (dry eyes).   Yes [provider]  levothyroxine (SYNTHROID, LEVOTHROID) 75 MCG tablet Take 75 mcg by mouth daily.    Yes [provider]  losartan (COZAAR) 100 MG tablet Take 100 mg by mouth daily.    Yes [provider]  lovastatin (MEVACOR) 20 MG tablet Take 20 mg by mouth 3 (three) times a week.   Yes [provider]  metFORMIN (GLUCOPHAGE-XR) 500 MG 24 hr tablet Take 500 mg by mouth daily.    Yes [provider]  omeprazole (PRILOSEC) 40 MG capsule Take 40 mg by mouth daily.    Yes [provider]  PARoxetine (PAXIL) 40 MG tablet Take 40 mg by mouth every evening.  09/28/16  Yes [provider]  solifenacin (VESICARE) 10 MG tablet Take 1 tablet (10 mg total) by mouth daily. 05/19/16  Yes McGowan, Larene Beach A, PA-C  vitamin E 400 UNIT capsule Take 400 Units by mouth 4 (four) times a week.    Yes [provider]   Dg Chest 1 View  Result Date: 09/28/2018 CLINICAL DATA:  Pt arrived from home via [facility]. Per EMS pt fell at home, c/o left hip pain; left leg foreshortened; Pre op; HTN, diabetes, asthma, nonsmoker EXAM: CHEST  1 VIEW COMPARISON:  12/03/2017 FINDINGS: Heart size is accentuated by the supine technique. There are no focal consolidations or pleural effusions. No pulmonary edema. Note is made of LEFT mid lung zone subsegmental  atelectasis or scarring. No pneumothorax or acute rib fractures. IMPRESSION: 1. No evidence for acute abnormality. 2. No acute rib fracture or bone destruction. Electronically Signed   By: Nolon Nations M.D.   On: 09/28/2018 10:16   Ct Head Wo Contrast  Result Date: 09/28/2018 CLINICAL DATA:  Mary [name] is a 84 y.o. female with a history of hypertension diabetes prior right hip prosthesis, left total knee replacement April 2019 and episodic dizziness who reports that this morning when she was standing up  she current dizzy and fell over on her LEFT side. Patient hit LEFT side of the head. No headache or vision changes. EXAM: CT HEAD WITHOUT CONTRAST TECHNIQUE: Contiguous axial images were obtained from the base of the skull through the vertex without intravenous contrast. COMPARISON:  05/19/2017 FINDINGS: Brain: There is central and cortical atrophy. Periventricular white matter changes are consistent with small vessel disease. There is no intra or extra-axial fluid collection or mass lesion. The basilar cisterns and ventricles have a normal appearance. There is no CT evidence for acute infarction or hemorrhage. Vascular: There is atherosclerotic calcification of the internal carotid arteries. No hyperdense vessels. Skull: Normal. Negative for fracture or focal lesion. Sinuses/Orbits: No acute finding. Other: No focal scalp edema. IMPRESSION: 1. No evidence for acute intracranial abnormality. 2. Atrophy and small vessel disease. Electronically Signed   By: Nolon Nations M.D.   On: 09/28/2018 12:21   US Carotid Bilateral  Result Date: 09/28/2018 CLINICAL DATA:  CVA EXAM: BILATERAL CAROTID DUPLEX ULTRASOUND TECHNIQUE: Pearline Cables scale imaging, color Doppler and duplex ultrasound were performed of bilateral carotid and vertebral arteries in the neck. COMPARISON:  None. FINDINGS: Criteria: Quantification of carotid stenosis is based on velocity parameters that correlate the residual internal carotid diameter  with NASCET-based stenosis levels, using the diameter of the distal internal carotid lumen as the denominator for stenosis measurement. The following velocity measurements were obtained: RIGHT ICA: 260 cm/sec CCA: 80 cm/sec SYSTOLIC ICA/CCA RATIO:  3.3 ECA: 178 cm/sec LEFT ICA: 106 cm/sec CCA: 458 cm/sec SYSTOLIC ICA/CCA RATIO:  1.0 ECA: 118 cm/sec RIGHT CAROTID ARTERY: There is prominent focal calcified plaque in the bulb with obvious luminal narrowing. Low resistance internal carotid Doppler pattern is preserved. RIGHT VERTEBRAL ARTERY:  Antegrade. LEFT CAROTID ARTERY: Mild smooth soft and calcified plaque in the bulb. Low resistance internal carotid Doppler pattern is preserved. There is moderate plaque at the proximal external carotid artery. LEFT VERTEBRAL ARTERY:  Antegrade IMPRESSION: Greater than 70% stenosis in the right internal carotid artery. Less than 50% stenosis in the left internal carotid artery. Antegrade flow in the right and left vertebral arteries. Electronically Signed   By: Marybelle Killings M.D.   On: 09/28/2018 14:02   Dg Hip Unilat W Or Wo Pelvis 2-3 Views Left  Result Date: 09/28/2018 CLINICAL DATA:  Pt arrived from home via [facility]. Per EMS pt fell at home, c/o left hip pain; left leg foreshortened; EXAM: DG HIP (WITH OR WITHOUT PELVIS) 2-3V LEFT COMPARISON:  09/24/2015 FINDINGS: The patient has had a hemiarthroplasty of the RIGHT hip. There is an acute fracture of the LEFT femoral neck, associated with varus angulation. No dislocation or subluxation. Remainder of the pelvis is intact. IMPRESSION: 1. Acute fracture of the LEFT femoral neck. 2. RIGHT hip hardware intact. Electronically Signed   By: Nolon Nations M.D.   On: 09/28/2018 10:15    Positive ROS: All other systems have been reviewed and were otherwise negative with the exception of those mentioned in the HPI and as above.  Physical Exam: General:  Alert, no acute distress Psychiatric:  Patient is competent for consent with  normal mood and affect   Cardiovascular:  No pedal edema Respiratory:  No wheezing, non-labored breathing GI:  Abdomen is soft and non-tender Skin:  No lesions in the area of chief complaint Neurologic:  Sensation intact distally Lymphatic:  No axillary or cervical lymphadenopathy  Orthopedic Exam:  Orthopedic examination is limited to the left hip and lower extremity.  On inspection, the  left lower extremity is somewhat shortened and externally rotated as compared to the right.  Skin inspection around the left hip is unremarkable.  There is no swelling, erythema, ecchymosis, abrasions, or other skin abnormalities identified.  She has mild tenderness to palpation over the lateral aspect of the left hip.  She has more significant pain with any attempted active or passive motion of the hip.  She is able dorsiflex and plantarflex her toes and ankle.  Sensation is intact light touch to all distributions.  She has good capillary refill to her left foot.  X-rays:  X-rays of the pelvis and left hip are available for review.  These films demonstrate a varus displaced left femoral neck fracture.  No lytic lesions or significant degenerative changes of the hip joint are identified.   Assessment: Displaced left femoral neck fracture.  Plan: The treatment options have been discussed with the patient, including both surgical and nonsurgical choices.  The patient would like to proceed with surgical intervention to include a left hip hemiarthroplasty.  This procedure has been discussed in detail with the patient, as have the potential risks (including bleeding, infection, nerve and/or blood vessel injury, persistent or recurrent pain, loosening of and/or failure of the components, dislocation, leg length inequality, need for further surgery, blood clots, strokes, heart attacks and/or arrhythmias, etc.) and benefits.  The patient states her understanding wishes to proceed.  A formal written consent will be  obtained by the nursing staff.  Thank you for asking me to participate in the care of this most pleasant woman.  Assuming the patient is cleared by cardiology, I will plan on doing her procedure tomorrow afternoon.  I will be happy to follow her with you.   Pascal Lux, MD  Beeper #:  [phone]  09/28/2018 6:36 PM

## 2018-09-28 NOTE — Progress Notes (Signed)
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

## 2018-09-28 NOTE — ED Provider Notes (Signed)
[facility] Emergency Department Provider Note  ____________________________________________  Time seen: Approximately 10:21 AM  I have reviewed the triage vital signs and the nursing notes.   HISTORY  Chief Complaint Fall    HPI Mary [name] is a 84 y.o. female with a history of hypertension diabetes prior right hip prosthesis, left total knee replacement April 2019 and episodic dizziness who reports that this morning when she was standing up she got dizzy and fell over on her left side.  Afterward she had immediate sudden severe pain in the left hip radiating down to the left leg without numbness.  She also reports hitting her left shoulder but that does not hurt.  She also reports hitting her left head but denies headache vision changes extremity paresthesia or neck pain.  Not on blood thinners.  Hip pain is worse with any movement, no alleviating factors.      Past Medical History:  Diagnosis Date  . Acid reflux   . Arthritis   . Asthma   . Depression   . Diabetes ([place])   . Dysrhythmia   . Heart murmur   . HLD (hyperlipidemia)   . HTN (hypertension)   . Hypotension    when get up too quickly  . Sleep apnea   . Urinary urgency      Patient Active Problem List   Diagnosis Date Noted  . Status post total knee replacement using cement, left 12/01/2017  . Hypertensive urgency 05/19/2017  . Anxiety 05/19/2016  . Insomnia 05/19/2016  . Pure hypercholesterolemia 05/19/2016  . Personal history of disease 03/11/2016  . Status post hip hemiarthroplasty 10/10/2015  . Hypoxia 09/26/2015  . Hypoxemia 09/26/2015  . Fracture of femoral neck, right ([place]) 09/23/2015  . Type 2 diabetes mellitus ([place]) 09/23/2015  . HTN (hypertension) 09/23/2015  . HLD (hyperlipidemia) 09/23/2015  . GERD (gastroesophageal reflux disease) 09/23/2015  . Depression 09/23/2015  . Hypothyroidism 09/23/2015  . DDD (degenerative disc disease), lumbar 08/26/2015  . Lumbar  stenosis with neurogenic claudication 08/26/2015  . Nocturia 04/29/2015  . Urinary frequency 04/29/2015  . Atrophic vaginitis 04/29/2015  . Mixed incontinence 06/28/2014  . Lumbar radiculopathy 02/08/2013     Past Surgical History:  Procedure Laterality Date  . ABDOMINAL HYSTERECTOMY    . CYSTOSCOPY    . HIP ARTHROPLASTY Right 09/24/2015   Procedure: ARTHROPLASTY BIPOLAR HIP (HEMIARTHROPLASTY);  Surgeon: Corky Mull, MD;  Location: [facility] ORS;  Service: Orthopedics;  Laterality: Right;  . JOINT REPLACEMENT     right hip  . JOINT REPLACEMENT     right knee  . KNEE ARTHROSCOPY W/ AUTOGENOUS CARTILAGE IMPLANTATION (ACI) PROCEDURE    . TOTAL KNEE ARTHROPLASTY Left 12/01/2017   Procedure: TOTAL KNEE ARTHROPLASTY;  Surgeon: Corky Mull, MD;  Location: [facility] ORS;  Service: Orthopedics;  Laterality: Left;  Marland Kitchen VARICOSE VEIN SURGERY    . VEIN SURGERY       Prior to Admission medications   Medication Sig Start Date End Date Taking? Authorizing Provider  acetaminophen (TYLENOL) 500 MG tablet Take 500 mg by mouth every 6 (six) hours as needed.    [provider]  amLODipine (NORVASC) 10 MG tablet Take 10 mg by mouth daily at 2 PM.     [provider]  busPIRone (BUSPAR) 10 MG tablet Take 20 mg by mouth 2 (two) times daily.  11/25/16   [provider]  conjugated estrogens (PREMARIN) vaginal cream Place 1 Applicatorful vaginally daily. Patient not taking: Reported on  05/19/2017 05/19/16   Zara Council A, PA-C  docusate sodium (COLACE) 100 MG capsule Take 1 capsule (100 mg total) by mouth 2 (two) times daily. 12/03/17   Duanne Guess, PA-C  enoxaparin (LOVENOX) 40 MG/0.4ML injection Inject 0.4 mLs (40 mg total) into the skin daily for 14 days. 12/03/17 12/17/17  Duanne Guess, PA-C  furosemide (LASIX) 20 MG tablet Take 20 mg by mouth daily as needed for edema.    [provider]  hydrALAZINE (APRESOLINE) 25 MG tablet Take 1 tablet (25 mg total) by mouth every  8 (eight) hours. Patient not taking: Reported on 11/14/2017 05/21/17   Demetrios Loll, MD  hydrochlorothiazide (HYDRODIURIL) 25 MG tablet Take 1 tablet (25 mg total) by mouth daily. Patient not taking: Reported on 11/14/2017 05/21/17   Demetrios Loll, MD  Hypromellose (ARTIFICIAL TEARS OP) Place 1 drop into both eyes daily as needed (dry eyes).    [provider]  isosorbide mononitrate (IMDUR) 30 MG 24 hr tablet Take 1 tablet (30 mg total) by mouth daily. Patient not taking: Reported on 11/14/2017 05/22/17   Demetrios Loll, MD  levothyroxine (SYNTHROID, LEVOTHROID) 75 MCG tablet Take 75 mcg by mouth daily.     [provider]  losartan (COZAAR) 100 MG tablet Take 100 mg by mouth daily.     [provider]  lovastatin (MEVACOR) 20 MG tablet Take 20 mg by mouth 3 (three) times a week.    [provider]  metFORMIN (GLUCOPHAGE-XR) 500 MG 24 hr tablet Take 500 mg by mouth daily.     [provider]  omeprazole (PRILOSEC) 40 MG capsule Take 40 mg by mouth daily.     [provider]  oxyCODONE (OXY IR/ROXICODONE) 5 MG immediate release tablet Take 1-2 tablets (5-10 mg total) by mouth every 4 (four) hours as needed for moderate pain (pain score 4-6). 12/03/17   Duanne Guess, PA-C  PARoxetine (PAXIL) 40 MG tablet Take 40 mg by mouth every evening.  09/28/16   [provider]  solifenacin (VESICARE) 10 MG tablet Take 1 tablet (10 mg total) by mouth daily. Patient not taking: Reported on 05/19/2017 05/19/16   Zara Council A, PA-C  vitamin B-12 (CYANOCOBALAMIN) 1000 MCG tablet Take 2,500 mcg by mouth daily.     [provider]  vitamin E 400 UNIT capsule Take 400 Units by mouth 4 (four) times a week.     [provider]     Allergies Cephalexin; Nitrofurantoin; Sulfa antibiotics; and Atorvastatin   Family History  Problem Relation Age of Onset  . Kidney disease Brother        also nephew  . Heart disease Mother   . Heart disease  Father   . Prostate cancer Neg Hx   . Bladder Cancer Neg Hx   . Breast cancer Neg Hx   . Kidney cancer Neg Hx     Social History Social History   Tobacco Use  . Smoking status: Never Smoker  . Smokeless tobacco: Never Used  Substance Use Topics  . Alcohol use: No    Alcohol/week: 0.0 standard drinks  . Drug use: No    Review of Systems  Constitutional:   No fever or chills.  Cardiovascular:   No chest pain or syncope. Respiratory:   No dyspnea or cough. Gastrointestinal:   Negative for abdominal pain, vomiting and diarrhea.  Musculoskeletal:   With left hip pain as above All other systems reviewed and are negative except as documented  above in ROS and HPI.  ____________________________________________   PHYSICAL EXAM:  VITAL SIGNS: ED Triage Vitals  Enc Vitals Group     BP 09/28/18 0938 (!) 232/89     Pulse --      Resp 09/28/18 0938 14     Temp --      Temp src --      SpO2 09/28/18 0938 90 %     Weight 09/28/18 0941 175 lb (79.4 kg)     Height 09/28/18 0941 5\' 7"  (1.702 m)     Head Circumference --      Peak Flow --      Pain Score 09/28/18 0939 8     Pain Loc --      Pain Edu? --      Excl. in [place]? --     Vital signs reviewed, nursing assessments reviewed.   Constitutional:   Alert and oriented. Non-toxic appearance. Eyes:   Conjunctivae are normal. EOMI. PERRL. ENT      Head:   Normocephalic and atraumatic.      Nose:   No congestion/rhinnorhea.       Mouth/Throat:   MMM, no pharyngeal erythema. No peritonsillar mass.       Neck:   No meningismus. Full ROM.  No midline spinal tenderness Hematological/Lymphatic/Immunilogical:   No cervical lymphadenopathy. Cardiovascular:   RRR. Symmetric bilateral radial and DP pulses.  No murmurs. Cap refill less than 2 seconds. Respiratory:   Normal respiratory effort without tachypnea/retractions. Breath sounds are clear and equal bilaterally. No wheezes/rales/rhonchi. Gastrointestinal:   Soft and nontender. Non  distended. There is no CVA tenderness.  No rebound, rigidity, or guarding. Musculoskeletal:   Left leg shortening and external rotation.  Tenderness at the left hip.  Neurovascular intact in the left foot, moving the left toes and ankle normally. Neurologic:   Normal speech and language.  Motor grossly intact. No acute focal neurologic deficits are appreciated.  Skin:    Skin is warm, dry and intact. No rash noted.  No petechiae, purpura, or bullae.  ____________________________________________    LABS (pertinent positives/negatives) (all labs ordered are listed, but only abnormal results are displayed) Labs Reviewed  BASIC METABOLIC PANEL - Abnormal; Notable for the following components:      Result Value   Glucose, Bld 145 (*)    All other components within normal limits  CBC WITH DIFFERENTIAL/PLATELET - Abnormal; Notable for the following components:   Hemoglobin 11.5 (*)    HCT 47.3 (*)    MCV 108.7 (*)    MCHC 24.3 (*)    RDW 15.7 (*)    All other components within normal limits  PROTIME-INR  APTT  SAMPLE TO BLOOD BANK   ____________________________________________   EKG  Interpreted by me Normal sinus rhythm rate of 77, normal axis intervals QRS ST segments and T waves  ____________________________________________    RADIOLOGY  Dg Chest 1 View  Result Date: 09/28/2018 CLINICAL DATA:  Pt arrived from home via [facility]. Per EMS pt fell at home, c/o left hip pain; left leg foreshortened; Pre op; HTN, diabetes, asthma, nonsmoker EXAM: CHEST  1 VIEW COMPARISON:  12/03/2017 FINDINGS: Heart size is accentuated by the supine technique. There are no focal consolidations or pleural effusions. No pulmonary edema. Note is made of LEFT mid lung zone subsegmental atelectasis or scarring. No pneumothorax or acute rib fractures. IMPRESSION: 1. No evidence for acute abnormality. 2. No acute rib fracture or bone destruction. Electronically Signed   By: Benjamine Mola  Owens Shark M.D.   On:  09/28/2018 10:16   Dg Hip Unilat W Or Wo Pelvis 2-3 Views Left  Result Date: 09/28/2018 CLINICAL DATA:  Pt arrived from home via [facility]. Per EMS pt fell at home, c/o left hip pain; left leg foreshortened; EXAM: DG HIP (WITH OR WITHOUT PELVIS) 2-3V LEFT COMPARISON:  09/24/2015 FINDINGS: The patient has had a hemiarthroplasty of the RIGHT hip. There is an acute fracture of the LEFT femoral neck, associated with varus angulation. No dislocation or subluxation. Remainder of the pelvis is intact. IMPRESSION: 1. Acute fracture of the LEFT femoral neck. 2. RIGHT hip hardware intact. Electronically Signed   By: Nolon Nations M.D.   On: 09/28/2018 10:15    ____________________________________________   PROCEDURES Procedures  ____________________________________________    CLINICAL IMPRESSION / ASSESSMENT AND PLAN / ED COURSE  Pertinent labs & imaging results that were available during my care of the patient were reviewed by me and considered in my medical decision making (see chart for details).      Clinical Course as of Sep 29 1019  Thu Sep 28, 2018  0939 Patient presents with an episode of dizziness on standing that caused her to fall hitting her head left shoulder and left hip.  Clinically she is likely to have a left hip fracture.  No outward signs of trauma over the upper extremity and full range of motion.  No outward signs of trauma or tenderness over the head or C-spine.  Given the severe pain that she will experience with any movement to the CT table and the likelihood of hospitalization and observation of her mental status, coupled with low clinical suspicion of significant head or C-spine trauma, I will defer on CT imaging at this time.   [PS]  1018 Case discussed with Dr. Roland Rack who has operated on the patient in the past, agrees with hospitalist management for preop optimization.   [PS]    Clinical Course User Index [PS] Carrie Mew, MD      ----------------------------------------- 10:23 AM on 09/28/2018 -----------------------------------------  Discussed with hospitalist.  Issue of deferred CT for minor head trauma related to Dr. Anselm Jungling.  ____________________________________________   FINAL CLINICAL IMPRESSION(S) / ED DIAGNOSES    Final diagnoses:  Closed fracture of neck of left femur, initial encounter [facility])     ED Discharge Orders    None      Portions of this note were generated with dragon dictation software. Dictation errors may occur despite best attempts at proofreading.   Carrie Mew, MD 09/28/18 1025

## 2018-09-28 NOTE — Progress Notes (Signed)
Family Meeting Note  Advance Directive:yes  Today a meeting took place with the Patient.   The following clinical team members were present during this meeting:MD  The following were discussed:Patient's diagnosis: Hypertension, hyperlipidemia, fall, episodes of dizziness and palpitation, shortness of breath with minimal exertion, hip fracture, Patient's progosis: Unable to determine and Goals for treatment: DNR  Additional follow-up to be provided: ortho, cardiology  Time spent during discussion:20 minutes  Vaughan Basta, MD

## 2018-09-28 NOTE — ED Triage Notes (Signed)
Pt arrived from home via [facility]. Per EMS pt fell at home, c/o L/leg, hip and shoulder pain 10/10 71mcg of fentanyl given in route by EMS bringing pain down to 6/10. 64mcg fentanyl was giving by EMS per verbal order by EDP Joni Fears who was at bedside at the time of patient arrival. Pt feels dizzy,  denies LOC, per pt she does not take any blood thinners. NAD noted at this time.

## 2018-09-28 NOTE — ED Notes (Signed)
Type and Screen recollected by Jeannene Patella, EDT.

## 2018-09-28 NOTE — ED Notes (Signed)
First troponin at 1106 was not collected in time until 1635.

## 2018-09-28 NOTE — Progress Notes (Signed)
Due to complains of recurrent dizziness with palpitations, SOB on minimal exertions and Uncontrolled Htn- suspect underlying A fib/ CHF.  Called cardiology for clearance for surgery.

## 2018-09-28 NOTE — ED Notes (Signed)
Patient transported to X-ray 

## 2018-09-28 NOTE — NC FL2 (Signed)
[place] LEVEL OF CARE SCREENING TOOL     IDENTIFICATION  Patient Name: Mary [name] Birthdate: November 12, 1934 Sex: female Admission Date (Current Location): 09/28/2018  [place] and [place] Number:  Engineering geologist and Address:  [facility], [address] [place], [place] [address]      Provider Number: [id number]  Attending Physician Name and Address:  Vaughan Basta, *  Relative Name and Phone Number:       Current Level of Care: Hospital Recommended Level of Care: [place] Prior Approval Number:    Date Approved/Denied:   PASRR Number: ([id number])  Discharge Plan: SNF    Current Diagnoses: Patient Active Problem List   Diagnosis Date Noted  . Hip fracture ([place]) 09/28/2018  . Dizziness 09/28/2018  . Status post total knee replacement using cement, left 12/01/2017  . Hypertensive urgency 05/19/2017  . Anxiety 05/19/2016  . Insomnia 05/19/2016  . Pure hypercholesterolemia 05/19/2016  . Personal history of disease 03/11/2016  . Status post hip hemiarthroplasty 10/10/2015  . Hypoxia 09/26/2015  . Hypoxemia 09/26/2015  . Fracture of femoral neck, right ([place]) 09/23/2015  . Type 2 diabetes mellitus ([place]) 09/23/2015  . HTN (hypertension) 09/23/2015  . HLD (hyperlipidemia) 09/23/2015  . GERD (gastroesophageal reflux disease) 09/23/2015  . Depression 09/23/2015  . Hypothyroidism 09/23/2015  . DDD (degenerative disc disease), lumbar 08/26/2015  . Lumbar stenosis with neurogenic claudication 08/26/2015  . Nocturia 04/29/2015  . Urinary frequency 04/29/2015  . Atrophic vaginitis 04/29/2015  . Mixed incontinence 06/28/2014  . Lumbar radiculopathy 02/08/2013    Orientation RESPIRATION BLADDER Height & Weight     Self, Time, Situation, Place  Normal Continent Weight: 175 lb (79.4 kg) Height:  5\' 7"  (170.2 cm)  BEHAVIORAL SYMPTOMS/MOOD NEUROLOGICAL BOWEL NUTRITION STATUS      Continent Diet(Diet:  Regular )  AMBULATORY STATUS COMMUNICATION OF NEEDS Skin   Extensive Assist Verbally Surgical wounds                       Personal Care Assistance Level of Assistance  Bathing, Feeding, Dressing Bathing Assistance: Limited assistance Feeding assistance: Independent Dressing Assistance: Limited assistance     Functional Limitations Info  Sight, Hearing, Speech Sight Info: Adequate Hearing Info: Adequate Speech Info: Adequate    SPECIAL CARE FACTORS FREQUENCY  PT (By licensed PT), OT (By licensed OT)     PT Frequency: (5) OT Frequency: (5)            Contractures      Additional Factors Info  Code Status, Allergies Code Status Info: (DNR ) Allergies Info: (Cephalexin, Nitrofurantoin, Sulfa Antibiotics, Atorvastatin)           Current Medications (09/28/2018):  This is the current hospital active medication list Current Facility-Administered Medications  Medication Dose Route Frequency Provider Last Rate Last Dose  . clindamycin (CLEOCIN) IVPB 900 mg  900 mg Intravenous Once Poggi, Marshall Cork, MD      . hydrALAZINE (APRESOLINE) injection 10 mg  10 mg Intravenous Q6H PRN Vaughan Basta, MD      . morphine 2 MG/ML injection 2 mg  2 mg Intravenous Q3H PRN Vaughan Basta, MD      . oxyCODONE-acetaminophen (PERCOCET/ROXICET) 5-325 MG per tablet 1 tablet  1 tablet Oral Q6H PRN Vaughan Basta, MD   1 tablet at 09/28/18 1447  . senna-docusate (Senokot-S) tablet 1 tablet  1 tablet Oral BID Vaughan Basta, MD   Stopped at 09/28/18 1235  Current Outpatient Medications  Medication Sig Dispense Refill  . acetaminophen (TYLENOL) 500 MG tablet Take 500 mg by mouth every 6 (six) hours as needed.    Marland Kitchen amLODipine (NORVASC) 10 MG tablet Take 10 mg by mouth daily at 2 PM.     . busPIRone (BUSPAR) 10 MG tablet Take 20 mg by mouth 2 (two) times daily.     . furosemide (LASIX) 20 MG tablet Take 20 mg by mouth daily as needed for edema.    . Hypromellose  (ARTIFICIAL TEARS OP) Place 1 drop into both eyes daily as needed (dry eyes).    Marland Kitchen levothyroxine (SYNTHROID, LEVOTHROID) 75 MCG tablet Take 75 mcg by mouth daily.     Marland Kitchen losartan (COZAAR) 100 MG tablet Take 100 mg by mouth daily.     Marland Kitchen lovastatin (MEVACOR) 20 MG tablet Take 20 mg by mouth 3 (three) times a week.    . metFORMIN (GLUCOPHAGE-XR) 500 MG 24 hr tablet Take 500 mg by mouth daily.     Marland Kitchen omeprazole (PRILOSEC) 40 MG capsule Take 40 mg by mouth daily.     Marland Kitchen PARoxetine (PAXIL) 40 MG tablet Take 40 mg by mouth every evening.     . solifenacin (VESICARE) 10 MG tablet Take 1 tablet (10 mg total) by mouth daily. 90 tablet 4  . vitamin E 400 UNIT capsule Take 400 Units by mouth 4 (four) times a week.        Discharge Medications: Please see discharge summary for a list of discharge medications.  Relevant Imaging Results:  Relevant Lab Results:   Additional Information (SSN: [id number])  Alika Eppes, Veronia Beets, LCSW

## 2018-09-28 NOTE — Progress Notes (Signed)
Noted to have > 70% stenosis on right ICA- called vascular consult.

## 2018-09-29 ENCOUNTER — Inpatient Hospital Stay: Payer: Medicare Other | Admitting: Anesthesiology

## 2018-09-29 ENCOUNTER — Encounter
Admission: EM | Disposition: A | Discharge status: Rehabilitation Facility | Payer: Self-pay | Source: Home / Self Care | Attending: Internal Medicine

## 2018-09-29 ENCOUNTER — Inpatient Hospital Stay (HOSPITAL_COMMUNITY)
Admit: 2018-09-29 | Discharge: 2018-09-29 | Disposition: A | Discharge status: Home / Self Care | Payer: Medicare Other | Attending: Internal Medicine | Admitting: Internal Medicine

## 2018-09-29 ENCOUNTER — Inpatient Hospital Stay: Payer: Medicare Other

## 2018-09-29 ENCOUNTER — Encounter: Payer: Self-pay | Admitting: Anesthesiology

## 2018-09-29 DIAGNOSIS — E119 Type 2 diabetes mellitus without complications: Secondary | ICD-10-CM

## 2018-09-29 DIAGNOSIS — I361 Nonrheumatic tricuspid (valve) insufficiency: Secondary | ICD-10-CM

## 2018-09-29 DIAGNOSIS — E785 Hyperlipidemia, unspecified: Secondary | ICD-10-CM

## 2018-09-29 DIAGNOSIS — I10 Essential (primary) hypertension: Secondary | ICD-10-CM

## 2018-09-29 DIAGNOSIS — I6523 Occlusion and stenosis of bilateral carotid arteries: Secondary | ICD-10-CM

## 2018-09-29 HISTORY — PX: HIP ARTHROPLASTY: SHX981

## 2018-09-29 LAB — URINALYSIS, COMPLETE (UACMP) WITH MICROSCOPIC
Bilirubin Urine: NEGATIVE
Glucose, UA: NEGATIVE mg/dL
Hgb urine dipstick: NEGATIVE
Ketones, ur: 5 mg/dL — AB
Leukocytes, UA: NEGATIVE
NITRITE: NEGATIVE
Protein, ur: 30 mg/dL — AB
Specific Gravity, Urine: 1.027 (ref 1.005–1.030)
WBC, UA: NONE SEEN WBC/hpf (ref 0–5)
pH: 5 (ref 5.0–8.0)

## 2018-09-29 LAB — GLUCOSE, CAPILLARY
Glucose-Capillary: 108 mg/dL — ABNORMAL HIGH (ref 70–99)
Glucose-Capillary: 107 mg/dL — ABNORMAL HIGH (ref 70–99)
Glucose-Capillary: 153 mg/dL — ABNORMAL HIGH (ref 70–99)
Glucose-Capillary: 173 mg/dL — ABNORMAL HIGH (ref 70–99)

## 2018-09-29 LAB — CBC
HCT: 37.3 % (ref 36.0–46.0)
Hemoglobin: 10.8 g/dL — ABNORMAL LOW (ref 12.0–15.0)
MCH: 26.3 pg (ref 26.0–34.0)
MCHC: 29 g/dL — ABNORMAL LOW (ref 30.0–36.0)
MCV: 91 fL (ref 80.0–100.0)
Platelets: 177 10*3/uL (ref 150–400)
RBC: 4.1 MIL/uL (ref 3.87–5.11)
RDW: 16.6 % — ABNORMAL HIGH (ref 11.5–15.5)
WBC: 8.1 10*3/uL (ref 4.0–10.5)
nRBC: 0 % (ref 0.0–0.2)

## 2018-09-29 LAB — BASIC METABOLIC PANEL
Anion gap: 7 (ref 5–15)
BUN: 16 mg/dL (ref 8–23)
CO2: 31 mmol/L (ref 22–32)
Calcium: 9.1 mg/dL (ref 8.9–10.3)
Chloride: 99 mmol/L (ref 98–111)
Creatinine, Ser: 0.54 mg/dL (ref 0.44–1.00)
GFR calc Af Amer: >60 mL/min (ref >60)
GFR calc non Af Amer: >60 mL/min (ref >60)
Glucose, Bld: 143 mg/dL — ABNORMAL HIGH (ref 70–99)
Potassium: 3.9 mmol/L (ref 3.5–5.1)
Sodium: 137 mmol/L (ref 135–145)

## 2018-09-29 LAB — HEMOGLOBIN A1C
Hgb A1c MFr Bld: 7.1 % — ABNORMAL HIGH (ref 4.8–5.6)
Mean Plasma Glucose: 157.07 mg/dL

## 2018-09-29 LAB — TROPONIN I: Troponin I: <0.03 ng/mL (ref <0.03)

## 2018-09-29 LAB — ECHOCARDIOGRAM COMPLETE
Height: 67 in
Weight: 2800 oz

## 2018-09-29 SURGERY — HEMIARTHROPLASTY, HIP, DIRECT ANTERIOR APPROACH, FOR FRACTURE
Anesthesia: Spinal | Site: Hip | Laterality: Left

## 2018-09-29 MED ORDER — STERILE WATER FOR INJECTION IJ SOLN
INTRAMUSCULAR | Status: AC
  Filled 2018-09-29: qty 10

## 2018-09-29 MED ORDER — HYDROMORPHONE HCL 1 MG/ML IJ SOLN: 0.2500 mg | INTRAMUSCULAR | Status: DC | PRN

## 2018-09-29 MED ORDER — VASOPRESSIN 20 UNIT/ML IV SOLN
INTRAVENOUS | Status: AC
  Filled 2018-09-29: qty 1

## 2018-09-29 MED ORDER — PROPOFOL 10 MG/ML IV BOLUS
INTRAVENOUS | Status: DC | PRN
  Administered 2018-09-29 (×2): 24 mg via INTRAVENOUS

## 2018-09-29 MED ORDER — FENTANYL CITRATE (PF) 100 MCG/2ML IJ SOLN: 25.0000 ug | INTRAMUSCULAR | Status: DC | PRN

## 2018-09-29 MED ORDER — CLINDAMYCIN PHOSPHATE 900 MG/50ML IV SOLN
900.0000 mg | Freq: Once | INTRAVENOUS | Status: AC
  Administered 2018-09-29: 900 mg via INTRAVENOUS
  Filled 2018-09-29: qty 50

## 2018-09-29 MED ORDER — TRANEXAMIC ACID 1000 MG/10ML IV SOLN
INTRAVENOUS | Status: DC | PRN
  Administered 2018-09-29: 1000 mg via TOPICAL

## 2018-09-29 MED ORDER — SODIUM CHLORIDE 0.9 % IV SOLN
INTRAVENOUS | Status: DC
  Administered 2018-09-29: 14:00:00 via INTRAVENOUS

## 2018-09-29 MED ORDER — BUPIVACAINE HCL (PF) 0.5 % IJ SOLN
INTRAMUSCULAR | Status: DC | PRN
  Administered 2018-09-29: 2 mL

## 2018-09-29 MED ORDER — ACETAMINOPHEN 325 MG PO TABS: 325.0000 mg | ORAL_TABLET | ORAL | Status: DC | PRN

## 2018-09-29 MED ORDER — PHENYLEPHRINE HCL 10 MG/ML IJ SOLN
INTRAMUSCULAR | Status: AC
  Filled 2018-09-29: qty 1

## 2018-09-29 MED ORDER — EPHEDRINE SULFATE 50 MG/ML IJ SOLN
INTRAMUSCULAR | Status: DC | PRN
  Administered 2018-09-29: 15 mg via INTRAVENOUS

## 2018-09-29 MED ORDER — MIDAZOLAM HCL 2 MG/2ML IJ SOLN
INTRAMUSCULAR | Status: DC | PRN
  Administered 2018-09-29: 1 mg via INTRAVENOUS

## 2018-09-29 MED ORDER — ONDANSETRON HCL 4 MG/2ML IJ SOLN
4.0000 mg | Freq: Four times a day (QID) | INTRAMUSCULAR | Status: DC | PRN
  Administered 2018-10-01 – 2018-10-02 (×2): 4 mg via INTRAVENOUS
  Filled 2018-09-29 (×3): qty 2

## 2018-09-29 MED ORDER — MIDAZOLAM HCL 2 MG/2ML IJ SOLN
INTRAMUSCULAR | Status: AC
  Filled 2018-09-29: qty 2

## 2018-09-29 MED ORDER — FLEET ENEMA 7-19 GM/118ML RE ENEM
1.0000 | ENEMA | Freq: Once | RECTAL | Status: AC | PRN
  Administered 2018-10-02: 1 via RECTAL

## 2018-09-29 MED ORDER — ACETAMINOPHEN 325 MG PO TABS
325.0000 mg | ORAL_TABLET | Freq: Four times a day (QID) | ORAL | Status: DC | PRN
  Administered 2018-10-01: 650 mg via ORAL
  Filled 2018-09-29: qty 2

## 2018-09-29 MED ORDER — ACETAMINOPHEN 160 MG/5ML PO SOLN
325.0000 mg | ORAL | Status: DC | PRN
  Filled 2018-09-29: qty 20.3

## 2018-09-29 MED ORDER — METOCLOPRAMIDE HCL 10 MG PO TABS
5.0000 mg | ORAL_TABLET | Freq: Three times a day (TID) | ORAL | Status: DC | PRN
  Administered 2018-09-30: 5 mg via ORAL
  Filled 2018-09-29: qty 1

## 2018-09-29 MED ORDER — IOHEXOL 350 MG/ML SOLN
75.0000 mL | Freq: Once | INTRAVENOUS | Status: AC | PRN
  Administered 2018-09-29: 75 mL via INTRAVENOUS

## 2018-09-29 MED ORDER — MEPERIDINE HCL 50 MG/ML IJ SOLN: 6.2500 mg | INTRAMUSCULAR | Status: DC | PRN

## 2018-09-29 MED ORDER — PROPOFOL 500 MG/50ML IV EMUL
INTRAVENOUS | Status: AC
  Filled 2018-09-29: qty 50

## 2018-09-29 MED ORDER — DOCUSATE SODIUM 100 MG PO CAPS
100.0000 mg | ORAL_CAPSULE | Freq: Two times a day (BID) | ORAL | Status: DC
  Administered 2018-09-30 – 2018-10-02 (×5): 100 mg via ORAL
  Filled 2018-09-29 (×5): qty 1

## 2018-09-29 MED ORDER — PROPOFOL 500 MG/50ML IV EMUL
INTRAVENOUS | Status: DC | PRN
  Administered 2018-09-29: 40 ug/kg/min via INTRAVENOUS

## 2018-09-29 MED ORDER — ENOXAPARIN SODIUM 40 MG/0.4ML ~~LOC~~ SOLN
40.0000 mg | SUBCUTANEOUS | Status: DC
  Administered 2018-09-30 – 2018-10-02 (×3): 40 mg via SUBCUTANEOUS
  Filled 2018-09-29 (×3): qty 0.4

## 2018-09-29 MED ORDER — METOCLOPRAMIDE HCL 5 MG/ML IJ SOLN: 5.0000 mg | Freq: Three times a day (TID) | INTRAMUSCULAR | Status: DC | PRN

## 2018-09-29 MED ORDER — SODIUM CHLORIDE 0.9 % IV SOLN
INTRAVENOUS | Status: DC
  Administered 2018-09-29: via INTRAVENOUS

## 2018-09-29 MED ORDER — EPHEDRINE SULFATE 50 MG/ML IJ SOLN
INTRAMUSCULAR | Status: AC
  Filled 2018-09-29: qty 1

## 2018-09-29 MED ORDER — ONDANSETRON HCL 4 MG PO TABS
4.0000 mg | ORAL_TABLET | Freq: Four times a day (QID) | ORAL | Status: DC | PRN
  Administered 2018-10-01: 4 mg via ORAL
  Filled 2018-09-29: qty 1

## 2018-09-29 MED ORDER — ACETAMINOPHEN 500 MG PO TABS
1000.0000 mg | ORAL_TABLET | Freq: Four times a day (QID) | ORAL | Status: AC
  Administered 2018-09-29 – 2018-09-30 (×4): 1000 mg via ORAL
  Filled 2018-09-29 (×4): qty 2

## 2018-09-29 MED ORDER — BISACODYL 10 MG RE SUPP: 10.0000 mg | Freq: Every day | RECTAL | Status: DC | PRN

## 2018-09-29 MED ORDER — VASOPRESSIN 20 UNIT/ML IV SOLN
INTRAVENOUS | Status: DC | PRN
  Administered 2018-09-29 (×2): 2 [IU] via INTRAVENOUS

## 2018-09-29 MED ORDER — BUPIVACAINE-EPINEPHRINE (PF) 0.25% -1:200000 IJ SOLN
INTRAMUSCULAR | Status: DC | PRN
  Administered 2018-09-29: 30 mL

## 2018-09-29 MED ORDER — MAGNESIUM HYDROXIDE 400 MG/5ML PO SUSP: 30.0000 mL | Freq: Every day | ORAL | Status: DC | PRN

## 2018-09-29 MED ORDER — PROMETHAZINE HCL 25 MG/ML IJ SOLN
12.5000 mg | Freq: Four times a day (QID) | INTRAMUSCULAR | Status: DC | PRN
  Administered 2018-09-29: 12.5 mg via INTRAVENOUS
  Filled 2018-09-29: qty 1

## 2018-09-29 MED ORDER — INSULIN ASPART 100 UNIT/ML ~~LOC~~ SOLN
0.0000 [IU] | Freq: Three times a day (TID) | SUBCUTANEOUS | Status: DC
  Administered 2018-09-30 – 2018-10-02 (×3): 1 [IU] via SUBCUTANEOUS
  Filled 2018-09-29 (×3): qty 1

## 2018-09-29 MED ORDER — HYDROCODONE-ACETAMINOPHEN 7.5-325 MG PO TABS: 1.0000 | ORAL_TABLET | Freq: Once | ORAL | Status: DC | PRN

## 2018-09-29 MED ORDER — PHENYLEPHRINE HCL 10 MG/ML IJ SOLN
INTRAMUSCULAR | Status: DC | PRN
  Administered 2018-09-29: 200 ug via INTRAVENOUS
  Administered 2018-09-29: 100 ug via INTRAVENOUS

## 2018-09-29 MED ORDER — DIPHENHYDRAMINE HCL 12.5 MG/5ML PO ELIX: 12.5000 mg | ORAL_SOLUTION | ORAL | Status: DC | PRN

## 2018-09-29 MED ORDER — BUPIVACAINE HCL (PF) 0.5 % IJ SOLN
INTRAMUSCULAR | Status: AC
  Filled 2018-09-29: qty 10

## 2018-09-29 MED ORDER — OXYCODONE HCL 5 MG PO TABS
5.0000 mg | ORAL_TABLET | ORAL | Status: DC | PRN
  Administered 2018-10-01: 5 mg via ORAL
  Filled 2018-09-29: qty 1

## 2018-09-29 MED ORDER — SODIUM CHLORIDE (PF) 0.9 % IJ SOLN
INTRAMUSCULAR | Status: AC
  Filled 2018-09-29: qty 20

## 2018-09-29 MED ORDER — CLINDAMYCIN PHOSPHATE 900 MG/50ML IV SOLN
900.0000 mg | Freq: Four times a day (QID) | INTRAVENOUS | Status: AC
  Administered 2018-09-29 – 2018-09-30 (×3): 900 mg via INTRAVENOUS
  Filled 2018-09-29 (×3): qty 50

## 2018-09-29 MED ORDER — BUPIVACAINE LIPOSOME 1.3 % IJ SUSP
INTRAMUSCULAR | Status: DC | PRN
  Administered 2018-09-29: 20 mL

## 2018-09-29 MED ORDER — SODIUM CHLORIDE 0.9 % IV SOLN
INTRAVENOUS | Status: DC | PRN
  Administered 2018-09-29: 40 ug/min via INTRAVENOUS

## 2018-09-29 MED ORDER — INSULIN ASPART 100 UNIT/ML ~~LOC~~ SOLN: 0.0000 [IU] | Freq: Every day | SUBCUTANEOUS | Status: DC

## 2018-09-29 MED ORDER — PROMETHAZINE HCL 25 MG/ML IJ SOLN: 6.2500 mg | INTRAMUSCULAR | Status: DC | PRN

## 2018-09-29 MED ORDER — TRAMADOL HCL 50 MG PO TABS
50.0000 mg | ORAL_TABLET | Freq: Four times a day (QID) | ORAL | Status: DC | PRN
  Administered 2018-09-30 – 2018-10-01 (×3): 50 mg via ORAL
  Filled 2018-09-29 (×4): qty 1

## 2018-09-29 SURGICAL SUPPLY — 66 items
BAG DECANTER FOR FLEXI CONT (MISCELLANEOUS) IMPLANT
BLADE HUB REPLACE 25X1.27X90 (BLADE) ×2 IMPLANT
BLADE SAGITTAL WIDE XTHICK NO (BLADE) ×3 IMPLANT
BLADE SURG SZ20 CARB STEEL (BLADE) ×3 IMPLANT
BNDG COHESIVE 6X5 TAN STRL LF (GAUZE/BANDAGES/DRESSINGS) ×3 IMPLANT
BOWL CEMENT MIXING ADV NOZZLE (MISCELLANEOUS) IMPLANT
CANISTER SUCT 1200ML W/VALVE (MISCELLANEOUS) ×3 IMPLANT
CANISTER SUCT 3000ML PPV (MISCELLANEOUS) ×6 IMPLANT
CHLORAPREP W/TINT 26ML (MISCELLANEOUS) ×6 IMPLANT
COVER WAND RF STERILE (DRAPES) ×3 IMPLANT
DECANTER SPIKE VIAL GLASS SM (MISCELLANEOUS) ×6 IMPLANT
DRAPE IMP U-DRAPE 54X76 (DRAPES) ×6 IMPLANT
DRAPE INCISE IOBAN 66X60 STRL (DRAPES) ×3 IMPLANT
DRAPE SHEET LG 3/4 BI-LAMINATE (DRAPES) ×3 IMPLANT
DRAPE SURG 17X11 SM STRL (DRAPES) ×3 IMPLANT
DRAPE SURG 17X23 STRL (DRAPES) ×3 IMPLANT
DRSG OPSITE POSTOP 4X12 (GAUZE/BANDAGES/DRESSINGS) ×1 IMPLANT
DRSG OPSITE POSTOP 4X14 (GAUZE/BANDAGES/DRESSINGS) ×3 IMPLANT
DRSG OPSITE POSTOP 4X8 (GAUZE/BANDAGES/DRESSINGS) ×2 IMPLANT
ELECT BLADE 6.5 EXT (BLADE) ×3 IMPLANT
ELECT CAUTERY BLADE 6.4 (BLADE) ×3 IMPLANT
ELECT REM PT RETURN 9FT ADLT (ELECTROSURGICAL) ×3
ELECTRODE REM PT RTRN 9FT ADLT (ELECTROSURGICAL) ×1 IMPLANT
GAUZE PACK 2X3YD (GAUZE/BANDAGES/DRESSINGS) IMPLANT
GLOVE BIO SURGEON STRL SZ8 (GLOVE) ×6 IMPLANT
GLOVE INDICATOR 8.0 STRL GRN (GLOVE) ×3 IMPLANT
GOWN STRL REUS W/ TWL LRG LVL3 (GOWN DISPOSABLE) ×1 IMPLANT
GOWN STRL REUS W/ TWL XL LVL3 (GOWN DISPOSABLE) ×1 IMPLANT
GOWN STRL REUS W/TWL LRG LVL3 (GOWN DISPOSABLE) ×3
GOWN STRL REUS W/TWL XL LVL3 (GOWN DISPOSABLE) ×3
HEAD ENDO II MOD SZ 44 (Orthopedic Implant) ×2 IMPLANT
HOOD PEEL AWAY FLYTE STAYCOOL (MISCELLANEOUS) ×6 IMPLANT
INSERT TAPER ENDO II STD (Orthopedic Implant) ×2 IMPLANT
IV NS 100ML SINGLE PACK (IV SOLUTION) IMPLANT
LABEL OR SOLS (LABEL) ×3 IMPLANT
MAT ABSORB  FLUID 56X50 GRAY (MISCELLANEOUS) ×2
MAT ABSORB FLUID 56X50 GRAY (MISCELLANEOUS) ×1 IMPLANT
NDL FILTER BLUNT 18X1 1/2 (NEEDLE) ×1 IMPLANT
NDL SAFETY ECLIPSE 18X1.5 (NEEDLE) ×1 IMPLANT
NDL SPNL 20GX3.5 QUINCKE YW (NEEDLE) ×1 IMPLANT
NEEDLE FILTER BLUNT 18X 1/2SAF (NEEDLE) ×2
NEEDLE FILTER BLUNT 18X1 1/2 (NEEDLE) ×1 IMPLANT
NEEDLE HYPO 18GX1.5 SHARP (NEEDLE) ×3
NEEDLE SPNL 20GX3.5 QUINCKE YW (NEEDLE) ×3 IMPLANT
NS IRRIG 1000ML POUR BTL (IV SOLUTION) ×3 IMPLANT
PACK HIP PROSTHESIS (MISCELLANEOUS) ×3 IMPLANT
PULSAVAC PLUS IRRIG FAN TIP (DISPOSABLE) ×3
SOL .9 NS 3000ML IRR  AL (IV SOLUTION) ×4
SOL .9 NS 3000ML IRR AL (IV SOLUTION) ×2
SOL .9 NS 3000ML IRR UROMATIC (IV SOLUTION) ×2 IMPLANT
STAPLER SKIN PROX 35W (STAPLE) ×3 IMPLANT
STEM FEMORAL 11MM X 135MM ECHO (Stem) ×2 IMPLANT
STRAP SAFETY 5IN WIDE (MISCELLANEOUS) ×3 IMPLANT
SUT ETHIBOND 2 V 37 (SUTURE) ×9 IMPLANT
SUT VIC AB 1 CT1 36 (SUTURE) ×6 IMPLANT
SUT VIC AB 2-0 CT1 (SUTURE) ×3 IMPLANT
SUT VIC AB 2-0 CT1 27 (SUTURE) ×9
SUT VIC AB 2-0 CT1 TAPERPNT 27 (SUTURE) ×3 IMPLANT
SUT VICRYL 1-0 27IN ABS (SUTURE) ×6
SUTURE VICRYL 1-0 27IN ABS (SUTURE) ×2 IMPLANT
SYR 10ML LL (SYRINGE) ×3 IMPLANT
SYR 30ML LL (SYRINGE) ×9 IMPLANT
SYR TB 1ML 27GX1/2 LL (SYRINGE) IMPLANT
TAPE TRANSPORE STRL 2 31045 (GAUZE/BANDAGES/DRESSINGS) ×3 IMPLANT
TIP BRUSH PULSAVAC PLUS 24.33 (MISCELLANEOUS) ×3 IMPLANT
TIP FAN IRRIG PULSAVAC PLUS (DISPOSABLE) ×1 IMPLANT

## 2018-09-29 NOTE — H&P (Signed)
H&P and Cardiology consult reviewed and patient re-examined. No changes.

## 2018-09-29 NOTE — Transfer of Care (Signed)
Immediate Anesthesia Transfer of Care Note  Patient: Mary [name]  Procedure(s) Performed: ARTHROPLASTY BIPOLAR HIP (HEMIARTHROPLASTY) LEFT (Left Hip)  Patient Location: PACU  Anesthesia Type:Spinal  Level of Consciousness: awake, alert  and oriented  Airway & Oxygen Therapy: Patient Spontanous Breathing and Patient connected to nasal cannula oxygen  Post-op Assessment: Report given to RN and Post -op Vital signs reviewed and stable  Post vital signs: Reviewed and stable  Last Vitals:  Vitals Value Taken Time  BP    Temp    Pulse    Resp    SpO2      Last Pain:  Vitals:   09/29/18 1340  TempSrc: Temporal  PainSc: 0-No pain      Patients Stated Pain Goal: 3 (72/62/03 5597)  Complications: No apparent anesthesia complications

## 2018-09-29 NOTE — Progress Notes (Signed)
[facility] NAME: Mary [name]    MR#:  [id number]  DATE OF BIRTH:  October 24, 1934  SUBJECTIVE:  CHIEF COMPLAINT:   Chief Complaint  Patient presents with  . Fall   Patient n.p.o. Continues to have pain on moving her leg. No shortness of breath at rest.  Afebrile.  REVIEW OF SYSTEMS:    Review of Systems  Constitutional: Negative for chills and fever.  HENT: Negative for sore throat.   Eyes: Negative for blurred vision, double vision and pain.  Respiratory: Negative for cough, hemoptysis, shortness of breath and wheezing.   Cardiovascular: Negative for chest pain, palpitations, orthopnea and leg swelling.  Gastrointestinal: Negative for abdominal pain, constipation, diarrhea, heartburn, nausea and vomiting.  Genitourinary: Negative for dysuria and hematuria.  Musculoskeletal: Negative for back pain and joint pain.  Skin: Negative for rash.  Neurological: Negative for sensory change, speech change, focal weakness and headaches.  Endo/Heme/Allergies: Does not bruise/bleed easily.  Psychiatric/Behavioral: Negative for depression. The patient is not nervous/anxious.    DRUG ALLERGIES:   Allergies  Allergen Reactions  . Cephalexin Hives  . Nitrofurantoin     Other reaction(s): Other (See Comments) Other Reaction: measles-like lesions  . Sulfa Antibiotics Hives  . Atorvastatin Other (See Comments)    Muscle spasms/ muscle pain    VITALS:  Blood pressure (!) 162/57, pulse 84, temperature 98.3 F (36.8 C), temperature source Oral, resp. rate 14, height 5\' 7"  (1.702 m), weight 79.4 kg, SpO2 100 %.  PHYSICAL EXAMINATION:   Physical Exam  GENERAL:  84 y.o.-year-old patient lying in the bed with no acute distress.  EYES: Pupils equal, round, reactive to light and accommodation. No scleral icterus. Extraocular muscles intact.  HEENT: Head atraumatic, normocephalic. Oropharynx and nasopharynx clear.  NECK:  Supple, no jugular  venous distention. No thyroid enlargement, no tenderness.  LUNGS: Normal breath sounds bilaterally, no wheezing, rales, rhonchi. No use of accessory muscles of respiration.  CARDIOVASCULAR: S1, S2 normal. No murmurs, rubs, or gallops.  ABDOMEN: Soft, nontender, nondistended. Bowel sounds present. No organomegaly or mass.  EXTREMITIES: No cyanosis, clubbing or edema b/l.    NEUROLOGIC: Cranial nerves II through XII are intact. No focal Motor or sensory deficits b/l.   PSYCHIATRIC: The patient is alert and oriented x 3.  SKIN: No obvious rash, lesion, or ulcer.   LABORATORY PANEL:   CBC Recent Labs  Lab 09/29/18 0559  WBC 8.1  HGB 10.8*  HCT 37.3  PLT 177   ------------------------------------------------------------------------------------------------------------------ Chemistries  Recent Labs  Lab 09/29/18 0410  NA 137  K 3.9  CL 99  CO2 31  GLUCOSE 143*  BUN 16  CREATININE 0.54  CALCIUM 9.1   ------------------------------------------------------------------------------------------------------------------  Cardiac Enzymes Recent Labs  Lab 09/29/18 0410  TROPONINI <0.03   ------------------------------------------------------------------------------------------------------------------  RADIOLOGY:  Dg Chest 1 View  Result Date: 09/28/2018 CLINICAL DATA:  Pt arrived from home via [facility]. Per EMS pt fell at home, c/o left hip pain; left leg foreshortened; Pre op; HTN, diabetes, asthma, nonsmoker EXAM: CHEST  1 VIEW COMPARISON:  12/03/2017 FINDINGS: Heart size is accentuated by the supine technique. There are no focal consolidations or pleural effusions. No pulmonary edema. Note is made of LEFT mid lung zone subsegmental atelectasis or scarring. No pneumothorax or acute rib fractures. IMPRESSION: 1. No evidence for acute abnormality. 2. No acute rib fracture or bone destruction. Electronically Signed   By: Nolon Nations M.D.   On: 09/28/2018 10:16  Ct Head Wo  Contrast  Result Date: 09/28/2018 CLINICAL DATA:  Mary [name] is a 84 y.o. female with a history of hypertension diabetes prior right hip prosthesis, left total knee replacement April 2019 and episodic dizziness who reports that this morning when she was standing up she current dizzy and fell over on her LEFT side. Patient hit LEFT side of the head. No headache or vision changes. EXAM: CT HEAD WITHOUT CONTRAST TECHNIQUE: Contiguous axial images were obtained from the base of the skull through the vertex without intravenous contrast. COMPARISON:  05/19/2017 FINDINGS: Brain: There is central and cortical atrophy. Periventricular white matter changes are consistent with small vessel disease. There is no intra or extra-axial fluid collection or mass lesion. The basilar cisterns and ventricles have a normal appearance. There is no CT evidence for acute infarction or hemorrhage. Vascular: There is atherosclerotic calcification of the internal carotid arteries. No hyperdense vessels. Skull: Normal. Negative for fracture or focal lesion. Sinuses/Orbits: No acute finding. Other: No focal scalp edema. IMPRESSION: 1. No evidence for acute intracranial abnormality. 2. Atrophy and small vessel disease. Electronically Signed   By: Nolon Nations M.D.   On: 09/28/2018 12:21   US Carotid Bilateral  Result Date: 09/28/2018 CLINICAL DATA:  CVA EXAM: BILATERAL CAROTID DUPLEX ULTRASOUND TECHNIQUE: Pearline Cables scale imaging, color Doppler and duplex ultrasound were performed of bilateral carotid and vertebral arteries in the neck. COMPARISON:  None. FINDINGS: Criteria: Quantification of carotid stenosis is based on velocity parameters that correlate the residual internal carotid diameter with NASCET-based stenosis levels, using the diameter of the distal internal carotid lumen as the denominator for stenosis measurement. The following velocity measurements were obtained: RIGHT ICA: 260 cm/sec CCA: 80 cm/sec SYSTOLIC ICA/CCA RATIO:  3.3  ECA: 178 cm/sec LEFT ICA: 106 cm/sec CCA: 606 cm/sec SYSTOLIC ICA/CCA RATIO:  1.0 ECA: 118 cm/sec RIGHT CAROTID ARTERY: There is prominent focal calcified plaque in the bulb with obvious luminal narrowing. Low resistance internal carotid Doppler pattern is preserved. RIGHT VERTEBRAL ARTERY:  Antegrade. LEFT CAROTID ARTERY: Mild smooth soft and calcified plaque in the bulb. Low resistance internal carotid Doppler pattern is preserved. There is moderate plaque at the proximal external carotid artery. LEFT VERTEBRAL ARTERY:  Antegrade IMPRESSION: Greater than 70% stenosis in the right internal carotid artery. Less than 50% stenosis in the left internal carotid artery. Antegrade flow in the right and left vertebral arteries. Electronically Signed   By: Marybelle Killings M.D.   On: 09/28/2018 14:02   Dg Hip Unilat W Or Wo Pelvis 2-3 Views Left  Result Date: 09/28/2018 CLINICAL DATA:  Pt arrived from home via [facility]. Per EMS pt fell at home, c/o left hip pain; left leg foreshortened; EXAM: DG HIP (WITH OR WITHOUT PELVIS) 2-3V LEFT COMPARISON:  09/24/2015 FINDINGS: The patient has had a hemiarthroplasty of the RIGHT hip. There is an acute fracture of the LEFT femoral neck, associated with varus angulation. No dislocation or subluxation. Remainder of the pelvis is intact. IMPRESSION: 1. Acute fracture of the LEFT femoral neck. 2. RIGHT hip hardware intact. Electronically Signed   By: Nolon Nations M.D.   On: 09/28/2018 10:15     ASSESSMENT AND PLAN:   *Left hip fracture N.p.o.  Waiting for surgery later today. Cleared by cardiology and vascular for her surgery today.  *Fall with dizziness Cardiology evaluated.  Outpatient follow-up.  Echo pending  *Carotid stenosis.  Vascular surgery has ordered a CTA.  Will need outpatient follow-up  *Uncontrolled hypertension Continue amlodipine, losartan,  give hydralazine as needed.  *Hyperlipidemia Continue lovastatin.  Patient is hoping to go home from the  hospital.  Depending on how she does with physical therapy after surgery.  All the records are reviewed and case discussed with Care Management/Social Worker. Management plans discussed with the patient, family and they are in agreement.  CODE STATUS: DNR  DVT Prophylaxis: SCDs  TOTAL TIME TAKING CARE OF THIS PATIENT: 35 minutes.   POSSIBLE D/C IN 2-3 DAYS, DEPENDING ON CLINICAL CONDITION.  Leia Alf Yahaira Bruski M.D on 09/29/2018 at 12:34 PM  Between 7am to 6pm - Pager - [phone]  After 6pm go to [email or web address] - password [email or web address] [facility]  Office  [phone]  CC: Primary care physician; Dion Body, MD  Note: This dictation was prepared with Dragon dictation along with smaller phrase technology. Any transcriptional errors that result from this process are unintentional.

## 2018-09-29 NOTE — Clinical Social Work Note (Signed)
Clinical Social Work Assessment  Patient Details  Name: Mary [name] MRN: [id number] Date of Birth: [birth year]  Date of referral:  09/29/18               Reason for consult:  Facility Placement                Permission sought to share information with:  Chartered certified accountant granted to share information::  Yes, Verbal Permission Granted  Name::      [place]::   [place]   Relationship::     Contact Information:     Housing/Transportation Living arrangements for the past 2 months:  [place] of Information:  Patient Patient Interpreter Needed:  None Criminal Activity/Legal Involvement Pertinent to Current Situation/Hospitalization:  No - Comment as needed Significant Relationships:  Siblings, Neighbor Lives with:  Adult Children Do you feel safe going back to the place where you live?  Yes Need for family participation in patient care:  Yes (Comment)  Care giving concerns:  Patient lives in [place] and is the primary caregiver for her adult daughter that has CP.    Social Worker assessment / plan:  Holiday representative ([place]) reviewed chart and noted that patient has a hip fracture. Per chart patient will have surgery today. PT is pending. CSW met with patient alone at bedside this morning prior to surgery. Patient was alert and oriented X4 and was laying in the bed. CSW introduced self and explained role of CSW department. Per patient she lives in [place] and takes care of her daughter who is wheel chair bound and has CP. Per patient her neighbor and sister are providing care for her daughter while she is at [facility]. Patient reported that she also has a personal care aide that comes in the morning to help get her daughter up and ready for the day. CSW explained that after surgery PT will evaluate her and make a recommendation of home health or SNF. CSW explained that [facility] will have to approve SNF. Patient reported that she  prefers to go to SNF and will have support for her daughter while she is at [facility]. Patient is agreeable to SNF search in [facility]. FL2 complete and faxed out.   CSW presented bed offers to patient and discussed the quality measures of the facilities. Patient chose [facility]. Per Otila Kluver [facility] liaison she will start [facility] SNF authorization today. CSW will send PT notes to [facility] when available. CSW will continue to follow and assist as needed.   Employment status:  Disabled (Comment on whether or not currently receiving Disability), Retired Nurse, adult PT Recommendations:  Not assessed at this time Information / Referral to community resources:  [place]  Patient/Family's Response to care:  Patient chose [facility].   Patient/Family's Understanding of and Emotional Response to Diagnosis, Current Treatment, and Prognosis:  Patient was very pleasant and thanked CSW for assistance.   Emotional Assessment Appearance:  Appears stated age Attitude/Demeanor/Rapport:    Affect (typically observed):  Accepting, Adaptable, Pleasant Orientation:  Oriented to Self, Oriented to Place, Oriented to  Time, Oriented to Situation Alcohol / Substance use:  Not Applicable Psych involvement (Current and /or in the community):  No (Comment)  Discharge Needs  Concerns to be addressed:  Discharge Planning Concerns Readmission within the last 30 days:  No Current discharge risk:  Dependent with Mobility Barriers to Discharge:  Continued Medical Work up  Charina Fons, Veronia Beets, LCSW 09/29/2018, 4:08 PM

## 2018-09-29 NOTE — Anesthesia Preprocedure Evaluation (Signed)
Anesthesia Evaluation  Patient identified by MRN, date of birth, ID band Patient awake    Reviewed: Allergy & Precautions, H&P , NPO status , reviewed documented beta blocker date and time   Airway Mallampati: II  TM Distance: >3 FB Neck ROM: full    Dental  (+) Chipped, Missing   Pulmonary asthma , sleep apnea ,    Pulmonary exam normal        Cardiovascular hypertension, Normal cardiovascular exam+ dysrhythmias + Valvular Problems/Murmurs  Rhythm:regular  Cardiology clearance noted   Neuro/Psych PSYCHIATRIC DISORDERS Anxiety Depression  Neuromuscular disease    GI/Hepatic GERD  Controlled,  Endo/Other  diabetesHypothyroidism   Renal/GU      Musculoskeletal  (+) Arthritis ,   Abdominal   Peds  Hematology   Anesthesia Other Findings Past Medical History: No date: Acid reflux No date: Arthritis No date: Asthma No date: Depression No date: Diabetes (HCC) No date: Dysrhythmia No date: Heart murmur No date: HLD (hyperlipidemia) No date: HTN (hypertension) No date: Hypotension     Comment:  when get up too quickly No date: Sleep apnea No date: Urinary urgency  Past Surgical History: No date: ABDOMINAL HYSTERECTOMY No date: CYSTOSCOPY 09/24/2015: HIP ARTHROPLASTY; Right     Comment:  Procedure: ARTHROPLASTY BIPOLAR HIP (HEMIARTHROPLASTY);               Surgeon: Corky Mull, MD;  Location: [facility] ORS;  Service:              Orthopedics;  Laterality: Right; No date: JOINT REPLACEMENT     Comment:  right hip No date: JOINT REPLACEMENT     Comment:  right knee No date: KNEE ARTHROSCOPY W/ AUTOGENOUS CARTILAGE IMPLANTATION (ACI)  PROCEDURE 12/01/2017: TOTAL KNEE ARTHROPLASTY; Left     Comment:  Procedure: TOTAL KNEE ARTHROPLASTY;  Surgeon: Corky Mull, MD;  Location: [facility] ORS;  Service: Orthopedics;                Laterality: Left; No date: VARICOSE VEIN SURGERY No date: VEIN SURGERY  BMI    Body Mass Index:  27.41 kg/m      Reproductive/Obstetrics                             Anesthesia Physical Anesthesia Plan  ASA: III  Anesthesia Plan: Spinal   Post-op Pain Management:    Induction:   PONV Risk Score and Plan: Treatment may vary due to age or medical condition and TIVA  Airway Management Planned: Nasal Cannula and Natural Airway  Additional Equipment:   Intra-op Plan:   Post-operative Plan:   Informed Consent: I have reviewed the patients History and Physical, chart, labs and discussed the procedure including the risks, benefits and alternatives for the proposed anesthesia with the patient or authorized representative who has indicated his/her understanding and acceptance.     Dental Advisory Given  Plan Discussed with: CRNA and Surgeon  Anesthesia Plan Comments:         Anesthesia Quick Evaluation

## 2018-09-29 NOTE — Clinical Social Work Placement (Signed)
   CLINICAL SOCIAL WORK PLACEMENT  NOTE  Date:  09/29/2018  Patient Details  Name: Mary [name] MRN: [id number] Date of Birth: 11/10/1934  Clinical Social Work is seeking post-discharge placement for this patient at the [place] level of care (*CSW will initial, date and re-position this form in  chart as items are completed):  Yes   Patient/family provided with [place] Work Department's list of facilities offering this level of care within the geographic area requested by the patient (or if unable, by the patient's family).  Yes   Patient/family informed of their freedom to choose among providers that offer the needed level of care, that participate in Medicare, Medicaid or managed care program needed by the patient, have an available bed and are willing to accept the patient.  Yes   Patient/family informed of [facility]'s ownership interest in [facility] and [facility], as well as of the fact that they are under no obligation to receive care at these facilities.  PASRR submitted to EDS on       PASRR number received on       Existing PASRR number confirmed on 09/28/18     FL2 transmitted to all facilities in geographic area requested by pt/family on 09/28/18     FL2 transmitted to all facilities within larger geographic area on       Patient informed that his/her managed care company has contracts with or will negotiate with certain facilities, including the following:        Yes   Patient/family informed of bed offers received.  Patient chooses bed at ([facility] )     Physician recommends and patient chooses bed at      Patient to be transferred to   on  .  Patient to be transferred to facility by       Patient family notified on   of transfer.  Name of family member notified:        PHYSICIAN       Additional Comment:    _______________________________________________ Roselind Klus, Veronia Beets, LCSW 09/29/2018, 4:07 PM

## 2018-09-29 NOTE — Progress Notes (Signed)
*  PRELIMINARY RESULTS* Echocardiogram 2D Echocardiogram has been performed.  Mary [name] 09/29/2018, 12:04 PM

## 2018-09-29 NOTE — Anesthesia Procedure Notes (Deleted)
Spinal  Staffing Performed: anesthesiologist      

## 2018-09-29 NOTE — Op Note (Signed)
09/28/2018 - 09/29/2018  4:19 PM  Patient:   Mary [name]  Pre-Op Diagnosis:   Displaced femoral neck fracture, left hip.  Post-Op Diagnosis:   Same.  Procedure:   Left hip unipolar hemiarthroplasty.  Surgeon:   Pascal Lux, MD  Assistant:   Cameron Proud, PA-C; Chrystine Oiler, PA-S  Anesthesia:   Spinal  Findings:   As above.  Complications:   None  EBL:   75 cc  Fluids:   500 cc crystalloid  UOP:   50 cc  TT:   None  Drains:   None  Closure:   Staples  Implants:   Biomet press-fit system with a #11 laterally offset reduced proximal profile Echo femoral stem, a 44 mm outer diameter shell, and a +0 mm neck adapter.  Brief Clinical Note:   The patient is an 84 year old female who sustained the above-noted injury yesterday morning when she lost her balance and fell onto her left side. She was brought to the emergency room where x-rays demonstrated the above-noted injury. The patient has been cleared medically and presents at this time for definitive management of the injury.  Procedure:   The patient was brought into the operating room. After adequate spinal anesthesia was obtained, the patient was repositioned in the right lateral decubitus position and secured using a lateral hip positioner. The left hip and lower extremity were prepped with ChloroPrep solution before being draped sterilely. Preoperative antibiotics were administered. A timeout was performed to verify the appropriate surgical site before a standard posterior approach to the hip was made through an approximately 4-5 inch incision. The incision was carried down through the subcutaneous tissues to expose the gluteal fascia and proximal end of the iliotibial band. These structures were split the length of the incision and the Charnley self-retaining hip retractor placed. The bursal tissues were swept posteriorly to expose the short external rotators. The anterior border of the piriformis tendon was identified and  this plane developed down through the capsule to enter the joint. Abundant fracture hematoma was suctioned. A flap of tissue was elevated off the posterior aspect of the femoral neck and greater trochanter and retracted posteriorly. This flap included the piriformis tendon, the short external rotators, and the posterior capsule. The femoral head was removed in its entirety, then taken to the back table where it was measured and found to be optimally replicated by a 44 mm head. The appropriate trial head was inserted and found to demonstrate an excellent suction fit.   Attention was directed to the femoral side. The femoral neck was recut 10-12 mm above the lesser trochanter using an oscillating saw. The piriformis fossa was debrided of soft tissues before the intramedullary canal was accessed through this point using a triple step reamer. The canal was reamed sequentially beginning with a #7 tapered reamer and progressing to a #11 tapered reamer. This provided excellent circumferential chatter. A box osteotome was used to establish version before the canal was broached sequentially beginning with a #8 broach and progressing to a #11 broach. This was left in place and several trial reductions performed. The permanent #11 laterally offset reduced proximal profile femoral stem was impacted into place. A repeat trial reduction was performed using both the -3 mm and +0 mm neck lengths. The +0 mm neck length demonstrated excellent stability both in extension and external rotation as well as with flexion to 90 and internal rotation beyond 70. It also was stable in the position of sleep. The 44  mm outer diameter shell with the +0  mm neck adapter construct was put together on the back table before being impacted onto the stem of the femoral component. The Morse taper locking mechanism was verified using manual distraction before the head was relocated and the hip placed through a range of motion with the findings as  described above.  The wound was copiously irrigated with bacitracin saline solution via the jet lavage system before the peri-incisional and pericapsular tissues were injected with 30 cc of 0.5% Sensorcaine with epinephrine and 20 cc of Exparel diluted out to 60 cc with normal saline to help with postoperative analgesia. The posterior flap was reapproximated to the posterior aspect of the greater trochanter using #2 Tycron interrupted sutures placed through drill holes. Several additional #2 Tycron interrupted sutures were used to reinforce this layer of closure. The iliotibial band was reapproximated using #1 Vicryl interrupted sutures before the gluteal fascia was closed using a running #1 Vicryl suture. At this point, 1 g of transexemic acid in 10 cc of normal saline was injected into the joint to help reduce postoperative bleeding. The subcutaneous tissues were closed in several layers using 2-0 Vicryl interrupted sutures before the skin was closed using staples. A sterile occlusive dressing was applied to the wound before the patient was placed into an abduction wedge pillow. The patient was then rolled back into the supine position on the hospital bed before being awakened and returned to the recovery room in satisfactory condition after tolerating the procedure well.

## 2018-09-29 NOTE — Care Management Note (Signed)
Case Management Note  Patient Details  Name: Mary [name] MRN: [id number] Date of Birth: October 02, 1934  Subjective/Objective:                  RNCM met with patient prior to surgery.  She hopes to go to rehab at a facility.  She is from home with her daughter that is handicap.  Patient states she has arranged caregivers for her daughter Olin Hauser at home (Sister Sunday Spillers, neighbor- couldn't remember name, and [employer] helps Olin Hauser get started every day). O2 is acute.  Patient is baseline independent at home and able to drive.  She is really discouraged and asked for prayer which was provided.  She has a rolling walker, wheelchair, and bedside commode available for use at home. Her PCP is Dr. Netty Starring.  She uses Geographical information systems officer for medications.   Action/Plan:  Home health list via CMS.gov provided to patient.  RNCM will follow.   Expected Discharge Date:                  Expected Discharge Plan:     In-House Referral:  Clinical Social Work  Discharge planning Services  CM Consult  Post Acute Care Choice:    Choice offered to:  Patient  DME Arranged:    DME Agency:     HH Arranged:    [place] Agency:     Status of Service:  In process, will continue to follow  If discussed at Long Length of Stay Meetings, dates discussed:    Additional Comments:  Marshell Garfinkel, RN 09/29/2018, 1:18 PM

## 2018-09-29 NOTE — Consult Note (Addendum)
[facility] Vascular Consult Note  MRN : [id number]  Mary [name] is a 84 y.o. (01/21/1935) female who presents with chief complaint of  Chief Complaint  Patient presents with  . Fall   History of Present Illness:  The patient is an 84 year old female with a past medical history of sleep apnea, hypertension, hyperlipidemia, heart murmur, dysrhythmia, diabetes, depression, asthma, arthritis, GERD who presented to the [facility] emergency department via EMS with a chief complaint of 10 out of 10 left lower extremity hip and leg pain after sustaining a fall at home.  Over the last few weeks, the patient endorses feeling "dizzy" which usually happens when she is either sitting or standing.  These episodes are not associated with any chest pain or shortness of breath.  The patient notes if she holds onto something during these episodes within a few seconds they will resolve.  Patient denies any loss of consciousness.  Yesterday, while walking to the bathroom she had 1 of these episodes lost her balance and fell.  After falling, the patient noted significant 10 out of 10 left lower extremity pain.  The patient was found to have a left hip fracture in the emergency department.   During her emergency room work-up the patient underwent a bilateral carotid ultrasound which was notable for: 1) Greater than 70% stenosis in the right internal carotid artery. 2) Less than 50% stenosis in the left internal carotid artery. 3) Antegrade flow in the right and left vertebral arteries.  CTA of the neck from 12/29/13 was notable for: 1. Irregular stenosis of approximately 70% and measuring 1.3 cm in  length at the origin of the right internal carotid artery.  2. Calcified plaque at the origin of the left external carotid  artery with associated 30-40% of stenosis by NASCET criteria.  3. No other abnormality within the major arterial vasculature of the  neck.    The patient denies any amaurosis fugax, TIA like symptoms or temporary/permanent neurological deficits.  Vascular surgery was consulted by Dr. Anselm Jungling for further recommendations.  Current Facility-Administered Medications  Medication Dose Route Frequency Provider Last Rate Last Dose  . acetaminophen (TYLENOL) tablet 500 mg  500 mg Oral Q6H PRN Vaughan Basta, MD      . amLODipine (NORVASC) tablet 10 mg  10 mg Oral Q1400 Vaughan Basta, MD   10 mg at 09/28/18 1817  . busPIRone (BUSPAR) tablet 20 mg  20 mg Oral BID Vaughan Basta, MD   20 mg at 09/28/18 2243  . clindamycin (CLEOCIN) IVPB 900 mg  900 mg Intravenous Once Poggi, Marshall Cork, MD      . clindamycin (CLEOCIN) IVPB 900 mg  900 mg Intravenous Once Poggi, Marshall Cork, MD      . darifenacin (ENABLEX) 24 hr tablet 7.5 mg  7.5 mg Oral Daily Vaughan Basta, MD      . docusate sodium (COLACE) capsule 100 mg  100 mg Oral BID PRN Vaughan Basta, MD      . hydrALAZINE (APRESOLINE) injection 10 mg  10 mg Intravenous Q6H PRN Vaughan Basta, MD   10 mg at 09/28/18 2030  . levothyroxine (SYNTHROID, LEVOTHROID) tablet 75 mcg  75 mcg Oral Daily Vaughan Basta, MD      . losartan (COZAAR) tablet 100 mg  100 mg Oral Daily Vaughan Basta, MD   100 mg at 09/28/18 1817  . metFORMIN (GLUCOPHAGE-XR) 24 hr tablet 500 mg  500 mg Oral Q breakfast Vaughan Basta, MD      .  morphine 2 MG/ML injection 2 mg  2 mg Intravenous Q3H PRN Vaughan Basta, MD   2 mg at 09/29/18 0711  . mupirocin ointment (BACTROBAN) 2 % 1 application  1 application Nasal BID Vaughan Basta, MD      . ondansetron [facility]) injection 4 mg  4 mg Intravenous Q6H Vaughan Basta, MD   4 mg at 09/28/18 2243  . oxyCODONE-acetaminophen (PERCOCET/ROXICET) 5-325 MG per tablet 1 tablet  1 tablet Oral Q6H PRN Vaughan Basta, MD   1 tablet at 09/29/18 0421  . pantoprazole (PROTONIX) EC tablet 40 mg  40 mg  Oral Daily Vaughan Basta, MD      . PARoxetine (PAXIL) tablet 40 mg  40 mg Oral QPM Vaughan Basta, MD   40 mg at 09/28/18 1817  . pravastatin (PRAVACHOL) tablet 20 mg  20 mg Oral q1800 Vaughan Basta, MD   20 mg at 09/28/18 1817  . promethazine (PHENERGAN) injection 12.5 mg  12.5 mg Intravenous Q6H PRN Lance Coon, MD   12.5 mg at 09/29/18 0106  . senna-docusate (Senokot-S) tablet 1 tablet  1 tablet Oral BID Vaughan Basta, MD   1 tablet at 09/28/18 2243   Past Medical History:  Diagnosis Date  . Acid reflux   . Arthritis   . Asthma   . Depression   . Diabetes ([place])   . Dysrhythmia   . Heart murmur   . HLD (hyperlipidemia)   . HTN (hypertension)   . Hypotension    when get up too quickly  . Sleep apnea   . Urinary urgency    Past Surgical History:  Procedure Laterality Date  . ABDOMINAL HYSTERECTOMY    . CYSTOSCOPY    . HIP ARTHROPLASTY Right 09/24/2015   Procedure: ARTHROPLASTY BIPOLAR HIP (HEMIARTHROPLASTY);  Surgeon: Corky Mull, MD;  Location: [facility] ORS;  Service: Orthopedics;  Laterality: Right;  . JOINT REPLACEMENT     right hip  . JOINT REPLACEMENT     right knee  . KNEE ARTHROSCOPY W/ AUTOGENOUS CARTILAGE IMPLANTATION (ACI) PROCEDURE    . TOTAL KNEE ARTHROPLASTY Left 12/01/2017   Procedure: TOTAL KNEE ARTHROPLASTY;  Surgeon: Corky Mull, MD;  Location: [facility] ORS;  Service: Orthopedics;  Laterality: Left;  Marland Kitchen VARICOSE VEIN SURGERY    . VEIN SURGERY     Social History Social History   Tobacco Use  . Smoking status: Never Smoker  . Smokeless tobacco: Never Used  Substance Use Topics  . Alcohol use: No    Alcohol/week: 0.0 standard drinks  . Drug use: No   Family History Family History  Problem Relation Age of Onset  . Kidney disease Brother        also nephew  . Heart disease Mother   . Heart disease Father   . Prostate cancer Neg Hx   . Bladder Cancer Neg Hx   . Breast cancer Neg Hx   . Kidney cancer Neg Hx   Patient  denies any family history of carotid artery disease, peripheral artery disease or venous disease.  Allergies  Allergen Reactions  . Cephalexin Hives  . Nitrofurantoin     Other reaction(s): Other (See Comments) Other Reaction: measles-like lesions  . Sulfa Antibiotics Hives  . Atorvastatin Other (See Comments)    Muscle spasms/ muscle pain   REVIEW OF SYSTEMS (Negative unless checked)  Constitutional: [] Weight loss  [] Fever  [] Chills Cardiac: [] Chest pain   [] Chest pressure   [x] Palpitations   [] Shortness of breath when laying flat   [] Shortness  of breath at rest   [] Shortness of breath with exertion. Vascular:  [] Pain in legs with walking   [] Pain in legs at rest   [] Pain in legs when laying flat   [] Claudication   [] Pain in feet when walking  [] Pain in feet at rest  [] Pain in feet when laying flat   [] History of DVT   [] Phlebitis   [] Swelling in legs   [] Varicose veins   [] Non-healing ulcers Pulmonary:   [] Uses home oxygen   [] Productive cough   [] Hemoptysis   [] Wheeze  [] COPD   [] Asthma Neurologic:  [x] Dizziness  [] Blackouts   [] Seizures   [] History of stroke   [] History of TIA  [] Aphasia   [] Temporary blindness   [] Dysphagia   [] Weakness or numbness in arms   [] Weakness or numbness in legs Musculoskeletal:  [] Arthritis   [] Joint swelling   [] Joint pain   [x] Low back pain Hematologic:  [] Easy bruising  [] Easy bleeding   [] Hypercoagulable state   [] Anemic  [] Hepatitis Gastrointestinal:  [] Blood in stool   [] Vomiting blood  [x] Gastroesophageal reflux/heartburn   [] Difficulty swallowing. Genitourinary:  [] Chronic kidney disease   [] Difficult urination  [] Frequent urination  [] Burning with urination   [] Blood in urine Skin:  [] Rashes   [] Ulcers   [] Wounds Psychological:  [] History of anxiety   []  History of major depression.  Physical Examination  Vitals:   09/28/18 2028 09/28/18 2203 09/28/18 2359 09/29/18 0800  BP: (!) 178/69 (!) 156/55 (!) 154/58 (!) 162/57  Pulse: 74 81 77 84   Resp:   16 14  Temp:   98.3 F (36.8 C) 98.3 F (36.8 C)  TempSrc:   Oral Oral  SpO2: 99%  98% 100%  Weight:      Height:       Body mass index is 27.41 kg/m. Gen:  WD/WN, NAD Head: [place]/AT, No temporalis wasting. Prominent temp pulse not noted. Ear/Nose/Throat: Hearing grossly intact, nares w/o erythema or drainage, oropharynx w/o Erythema/Exudate Eyes: Sclera non-icteric, conjunctiva clear Neck: Trachea midline.  No JVD. Bilateral bruits noted on exam Pulmonary:  Good air movement, respirations not labored, equal bilaterally.  Cardiac: RRR, normal S1, S2. Vascular:  Vessel Right Left  Radial Palpable Palpable  Ulnar Palpable Palpable  Brachial Palpable Palpable  Carotid Palpable, without bruit Palpable, without bruit  Aorta Not palpable N/A  Femoral Palpable Palpable  Popliteal Palpable Palpable  PT Palpable Palpable  DP Palpable Palpable   Gastrointestinal: soft, non-tender/non-distended. No guarding/reflex.  Musculoskeletal: M/S 5/5 throughout.  Extremities without ischemic changes.  No deformity or atrophy. Mild edema bilaterally Neurologic: Sensation grossly intact in extremities.  Symmetrical.  Speech is fluent. Motor exam as listed above. Psychiatric: Judgment intact, Mood & affect appropriate for pt's clinical situation. Dermatologic: No rashes or ulcers noted.  No cellulitis or open wounds. Lymph : No Cervical, Axillary, or Inguinal lymphadenopathy.  CBC Lab Results  Component Value Date   WBC 8.1 09/29/2018   HGB 10.8 (L) 09/29/2018   HCT 37.3 09/29/2018   MCV 91.0 09/29/2018   PLT 177 09/29/2018   BMET    Component Value Date/Time   NA 137 09/29/2018 0410   NA 140 12/30/2013 0554   K 3.9 09/29/2018 0410   K 3.6 12/30/2013 0554   CL 99 09/29/2018 0410   CL 105 12/30/2013 0554   CO2 31 09/29/2018 0410   CO2 31 12/30/2013 0554   GLUCOSE 143 (H) 09/29/2018 0410   GLUCOSE 121 (H) 12/30/2013 0554   BUN 16 09/29/2018 0410  BUN 11 12/30/2013 0554    CREATININE 0.54 09/29/2018 0410   CREATININE 0.70 12/30/2013 0554   CALCIUM 9.1 09/29/2018 0410   CALCIUM 8.6 12/30/2013 0554   GFRNONAA >60 09/29/2018 0410   GFRNONAA >60 12/30/2013 0554   GFRAA >60 09/29/2018 0410   GFRAA >60 12/30/2013 0554   Estimated Creatinine Clearance: 59.8 mL/min (by C-G formula based on SCr of 0.54 mg/dL).  COAG Lab Results  Component Value Date   INR 0.93 09/28/2018   INR 0.96 11/23/2017   Radiology Dg Chest 1 View  Result Date: 09/28/2018 CLINICAL DATA:  Pt arrived from home via [place]. Per EMS pt fell at home, c/o left hip pain; left leg foreshortened; Pre op; HTN, diabetes, asthma, nonsmoker EXAM: CHEST  1 VIEW COMPARISON:  12/03/2017 FINDINGS: Heart size is accentuated by the supine technique. There are no focal consolidations or pleural effusions. No pulmonary edema. Note is made of LEFT mid lung zone subsegmental atelectasis or scarring. No pneumothorax or acute rib fractures. IMPRESSION: 1. No evidence for acute abnormality. 2. No acute rib fracture or bone destruction. Electronically Signed   By: Nolon Nations M.D.   On: 09/28/2018 10:16   Ct Head Wo Contrast  Result Date: 09/28/2018 CLINICAL DATA:  Mary [name] is a 84 y.o. female with a history of hypertension diabetes prior right hip prosthesis, left total knee replacement April 2019 and episodic dizziness who reports that this morning when she was standing up she current dizzy and fell over on her LEFT side. Patient hit LEFT side of the head. No headache or vision changes. EXAM: CT HEAD WITHOUT CONTRAST TECHNIQUE: Contiguous axial images were obtained from the base of the skull through the vertex without intravenous contrast. COMPARISON:  05/19/2017 FINDINGS: Brain: There is central and cortical atrophy. Periventricular white matter changes are consistent with small vessel disease. There is no intra or extra-axial fluid collection or mass lesion. The basilar cisterns and ventricles have a normal  appearance. There is no CT evidence for acute infarction or hemorrhage. Vascular: There is atherosclerotic calcification of the internal carotid arteries. No hyperdense vessels. Skull: Normal. Negative for fracture or focal lesion. Sinuses/Orbits: No acute finding. Other: No focal scalp edema. IMPRESSION: 1. No evidence for acute intracranial abnormality. 2. Atrophy and small vessel disease. Electronically Signed   By: Nolon Nations M.D.   On: 09/28/2018 12:21   US Carotid Bilateral  Result Date: 09/28/2018 CLINICAL DATA:  CVA EXAM: BILATERAL CAROTID DUPLEX ULTRASOUND TECHNIQUE: Pearline Cables scale imaging, color Doppler and duplex ultrasound were performed of bilateral carotid and vertebral arteries in the neck. COMPARISON:  None. FINDINGS: Criteria: Quantification of carotid stenosis is based on velocity parameters that correlate the residual internal carotid diameter with NASCET-based stenosis levels, using the diameter of the distal internal carotid lumen as the denominator for stenosis measurement. The following velocity measurements were obtained: RIGHT ICA: 260 cm/sec CCA: 80 cm/sec SYSTOLIC ICA/CCA RATIO:  3.3 ECA: 178 cm/sec LEFT ICA: 106 cm/sec CCA: 097 cm/sec SYSTOLIC ICA/CCA RATIO:  1.0 ECA: 118 cm/sec RIGHT CAROTID ARTERY: There is prominent focal calcified plaque in the bulb with obvious luminal narrowing. Low resistance internal carotid Doppler pattern is preserved. RIGHT VERTEBRAL ARTERY:  Antegrade. LEFT CAROTID ARTERY: Mild smooth soft and calcified plaque in the bulb. Low resistance internal carotid Doppler pattern is preserved. There is moderate plaque at the proximal external carotid artery. LEFT VERTEBRAL ARTERY:  Antegrade IMPRESSION: Greater than 70% stenosis in the right internal carotid artery. Less than 50% stenosis in  the left internal carotid artery. Antegrade flow in the right and left vertebral arteries. Electronically Signed   By: Marybelle Killings M.D.   On: 09/28/2018 14:02   Dg Hip  Unilat W Or Wo Pelvis 2-3 Views Left  Result Date: 09/28/2018 CLINICAL DATA:  Pt arrived from home via [facility]. Per EMS pt fell at home, c/o left hip pain; left leg foreshortened; EXAM: DG HIP (WITH OR WITHOUT PELVIS) 2-3V LEFT COMPARISON:  09/24/2015 FINDINGS: The patient has had a hemiarthroplasty of the RIGHT hip. There is an acute fracture of the LEFT femoral neck, associated with varus angulation. No dislocation or subluxation. Remainder of the pelvis is intact. IMPRESSION: 1. Acute fracture of the LEFT femoral neck. 2. RIGHT hip hardware intact. Electronically Signed   By: Nolon Nations M.D.   On: 09/28/2018 10:15   Assessment/Plan The patient is an 84 year old female with a past medical history of sleep apnea, hypertension, hyperlipidemia, heart murmur, dysrhythmia, diabetes, depression, asthma, arthritis, GERD who presented to the [facility] emergency department via EMS with a chief complaint of 10 out of 10 left lower extremity hip and leg pain after sustaining a fall at home.  The patient was found to have a left hip fracture.  The patient was also found to have bilateral carotid artery stenosis on duplex. 1.  Carotid artery stenosis: Patient has a known history of carotid artery stenosis with multiple risk factors for atherosclerotic disease noted on CTA of the neck conducted on 12/29/13.  Carotid duplex conducted on September 28, 2018 compared to the CTA of the neck on Dec 25, 2013 is relatively stable (70% right internal carotid artery stenosis, 40 to 50% left carotid artery stenosis).  The patient denies any amaurosis fugax or TIA like symptoms at this time.  I will order a CTA of the neck to better assess the exact degree of carotid artery stenosis and confirm it has remained stable.  If the patient's stenosis hasn't worsened would recommend outpatient follow-up in approximately 3 months to start undergoing bilateral carotid artery duplexes every 6 months.  Recommend medical  management with aspirin and statin. 2. Hyperlipidemia: On aspirin and statin. Encouraged good control as its slows the progression of atherosclerotic disease. 3. Diabetes: On appropriate medications. Encouraged good control as its slows the progression of atherosclerotic disease. 4. Hypertension: On appropriate medications. Encouraged good control as its slows the progression of atherosclerotic disease.  Discussed with Dr. Lucky Cowboy  ADDENDUM 09/29/18 CTA NECK: 1. 70% stenosis RIGHT ICA.  Less than 50% stenosis LEFT ICA. 2. Bilateral cervical ICA fibromuscular dysplasia. 3. Mild stenosis RIGHT vertebral artery origin, patent vertebral arteries.  Assessment 1) CTA is stable when compared to 2015.  2) Patient to follow up in three months with carotid duplex to establish care as an outpatient. 3) No indication for endovascular or open intervention at this time. 4) Vascular to sign off at this time. Re-consult if needed.  Discussed with Dr. Mayme Genta, PA-C  09/29/2018 12:09 PM  This note was created with Dragon medical transcription system.  Any error is purely unintentional.

## 2018-09-29 NOTE — Anesthesia Procedure Notes (Addendum)
Spinal  Patient location during procedure: OR Start time: 09/29/2018 2:45 PM End time: 09/29/2018 2:49 PM Staffing Anesthesiologist: Alphonsus Sias, MD Resident/CRNA: Eben Burow, CRNA Performed: anesthesiologist  Preanesthetic Checklist Completed: patient identified, site marked, surgical consent, pre-op evaluation, timeout performed, IV checked, risks and benefits discussed and monitors and equipment checked Spinal Block Patient position: right lateral decubitus Prep: ChloraPrep and site prepped and draped Patient monitoring: heart rate, cardiac monitor, continuous pulse ox and blood pressure Approach: right paramedian Location: L2-3 Injection technique: single-shot Needle Needle type: [place]  Needle gauge: 22 G Needle length: 10 cm Needle insertion depth: 6 cm Additional Notes Hypobaric spinal: 2 ml 0.5% PF bupiv plus 1 ml sterile PF water. Clear CSF no problem, tol well

## 2018-09-29 NOTE — Anesthesia Post-op Follow-up Note (Signed)
Anesthesia QCDR form completed.        

## 2018-09-30 LAB — BASIC METABOLIC PANEL
Anion gap: 4 — ABNORMAL LOW (ref 5–15)
BUN: 18 mg/dL (ref 8–23)
CO2: 30 mmol/L (ref 22–32)
Calcium: 9 mg/dL (ref 8.9–10.3)
Chloride: 103 mmol/L (ref 98–111)
Creatinine, Ser: 0.65 mg/dL (ref 0.44–1.00)
GFR calc Af Amer: >60 mL/min (ref >60)
GFR calc non Af Amer: >60 mL/min (ref >60)
Glucose, Bld: 148 mg/dL — ABNORMAL HIGH (ref 70–99)
Potassium: 4.1 mmol/L (ref 3.5–5.1)
Sodium: 137 mmol/L (ref 135–145)

## 2018-09-30 LAB — CBC WITH DIFFERENTIAL/PLATELET
Abs Immature Granulocytes: 0.03 10*3/uL (ref 0.00–0.07)
BASOS ABS: 0 10*3/uL (ref 0.0–0.1)
Basophils Relative: 0 %
Eosinophils Absolute: 0.1 10*3/uL (ref 0.0–0.5)
Eosinophils Relative: 2 %
HCT: 32.8 % — ABNORMAL LOW (ref 36.0–46.0)
Hemoglobin: 9.6 g/dL — ABNORMAL LOW (ref 12.0–15.0)
Immature Granulocytes: 0 %
Lymphocytes Relative: 7 %
Lymphs Abs: 0.6 10*3/uL — ABNORMAL LOW (ref 0.7–4.0)
MCH: 26.4 pg (ref 26.0–34.0)
MCHC: 29.3 g/dL — ABNORMAL LOW (ref 30.0–36.0)
MCV: 90.1 fL (ref 80.0–100.0)
Monocytes Absolute: 0.7 10*3/uL (ref 0.1–1.0)
Monocytes Relative: 8 %
NEUTROS ABS: 6.7 10*3/uL (ref 1.7–7.7)
Neutrophils Relative %: 83 %
Platelets: 130 10*3/uL — ABNORMAL LOW (ref 150–400)
RBC: 3.64 MIL/uL — ABNORMAL LOW (ref 3.87–5.11)
RDW: 16.4 % — AB (ref 11.5–15.5)
Smear Review: NORMAL
WBC: 7.8 10*3/uL (ref 4.0–10.5)
nRBC: 0 % (ref 0.0–0.2)

## 2018-09-30 LAB — GLUCOSE, CAPILLARY
Glucose-Capillary: 133 mg/dL — ABNORMAL HIGH (ref 70–99)
Glucose-Capillary: 119 mg/dL — ABNORMAL HIGH (ref 70–99)
Glucose-Capillary: 112 mg/dL — ABNORMAL HIGH (ref 70–99)
Glucose-Capillary: 120 mg/dL — ABNORMAL HIGH (ref 70–99)

## 2018-09-30 NOTE — Progress Notes (Signed)
Physical Therapy Treatment Patient Details Name: Mary [name] MRN: [id number] DOB: Aug 31, 1934 Today's Date: 09/30/2018    History of Present Illness 84 yo female with onset of fall with L femoral neck fracture was given hemiarthroplasty on 09/29/18,  with posterior hip precautions and WBAT for rehab today.  PMHx:  R THA, L TKA, HTN, sleep apnea, DM    PT Comments    Pt is demonstrating better awareness of how to initiate effort on LLE with management of pain.  Pt has used RW for transfer to [employer] and then back to bed with cues for direction and control of L hip precautions.  Used pillows to position hip but agreed to have abd cushion back on at bedtime.  Follow acutely as planned.  Follow Up Recommendations  SNF     Equipment Recommendations  None recommended by PT    Recommendations for Other Services       Precautions / Restrictions Precautions Precautions: Fall;Posterior Hip Precaution Booklet Issued: No Precaution Comments: verbally reviewed Required Braces or Orthoses: Other Brace(abd pillow) Restrictions Weight Bearing Restrictions: Yes LLE Weight Bearing: Weight bearing as tolerated Other Position/Activity Restrictions: posterior hip    Mobility  Bed Mobility Overal bed mobility: Needs Assistance Bed Mobility: Sit to Supine     Supine to sit: Mod assist     General bed mobility comments: mod assist to lift legs onto bed  Transfers Overall transfer level: Needs assistance Equipment used: Rolling walker (2 wheeled);1 person hand held assist Transfers: Sit to/from [facility] Sit to Stand: Mod assist Stand pivot transfers: Min assist       General transfer comment: mod to power up and then min to steady balance  Ambulation/Gait Ambulation/Gait assistance: Min assist Gait Distance (Feet): 6 Feet Assistive device: Rolling walker (2 wheeled);1 person hand held assist Gait Pattern/deviations: Step-to pattern;Step-through pattern;Decreased stride  length;Wide base of support Gait velocity: reduced Gait velocity interpretation: <1.31 ft/sec, indicative of household ambulator General Gait Details: with careful control of walker could step to chair with min assist to balance and to maneuver walker, min to control descent   Stairs             Wheelchair Mobility    Modified Rankin (Stroke Patients Only)       Balance Overall balance assessment: History of Falls                                          Cognition Arousal/Alertness: Awake/alert Behavior During Therapy: WFL for tasks assessed/performed Overall Cognitive Status: Within Functional Limits for tasks assessed                                        Exercises      General Comments        Pertinent Vitals/Pain Pain Assessment: 0-10 Pain Score: 6  Pain Location: L hip Pain Descriptors / Indicators: Operative site guarding Pain Intervention(s): Limited activity within patient's tolerance;Monitored during session;Premedicated before session;Repositioned;Ice applied;Patient requesting pain meds-RN notified    Home Living                      Prior Function            PT Goals (current goals can now be found in the care plan  section) Acute Rehab PT Goals Patient Stated Goal: to get stronger and reduce pain Progress towards PT goals: Progressing toward goals    Frequency    BID      PT Plan Current plan remains appropriate    Co-evaluation              AM-PAC PT "6 Clicks" Mobility   Outcome Measure  Help needed turning from your back to your side while in a flat bed without using bedrails?: A Little Help needed moving from lying on your back to sitting on the side of a flat bed without using bedrails?: A Lot Help needed moving to and from a bed to a chair (including a wheelchair)?: A Lot Help needed standing up from a chair using your arms (e.g., wheelchair or bedside chair)?: A Lot Help  needed to walk in hospital room?: A Lot Help needed climbing 3-5 steps with a railing? : Total 6 Click Score: 12    End of Session Equipment Utilized During Treatment: Gait belt;Oxygen Activity Tolerance: Patient limited by fatigue;Patient limited by pain Patient left: in bed;with call bell/phone within reach;with bed alarm set;with nursing/sitter in room Nurse Communication: Mobility status PT Visit Diagnosis: Unsteadiness on feet (R26.81);Muscle weakness (generalized) (M62.81);Difficulty in walking, not elsewhere classified (R26.2);Pain Pain - Right/Left: Left Pain - part of body: Hip     Time: 2023-3435 PT Time Calculation (min) (ACUTE ONLY): 29 min  Charges:  $Gait Training: 8-22 mins $Therapeutic Activity: 8-22 mins                    Ramond Dial 09/30/2018, 9:01 PM  Mee Hives, PT MS Acute Rehab Dept. Number: [facility] [phone] and [phone]

## 2018-09-30 NOTE — Evaluation (Signed)
Physical Therapy Evaluation Patient Details Name: Mary [name] MRN: [id number] DOB: 15-Dec-1934 Today's Date: 09/30/2018   History of Present Illness  84 yo female with onset of fall with L femoral neck fracture was given hemiarthroplasty on 09/29/18,  with posterior hip precautions and WBAT for rehab today.  PMHx:  R THA, L TKA, HTN, sleep apnea, DM  Clinical Impression  Pt was seen for initial evaluation with mobility for all steps from bed to sit up in chair.  Her pain level was significant and contacted nursing for medication during session.  Pt was able to get up to chair with RW and assist, and will be appropriate for rehab setting given her pain and strength limits to functionally mobilize.  Received information from MD that pt is going to have posterior hip precautions, and will instruct her regularly in this regard.    Follow Up Recommendations SNF    Equipment Recommendations  None recommended by PT    Recommendations for Other Services       Precautions / Restrictions Precautions Precautions: Fall;Posterior Hip Precaution Booklet Issued: No Precaution Comments: verbally reviewed Required Braces or Orthoses: Other Brace(has abd cushion and ) Restrictions Weight Bearing Restrictions: Yes LLE Weight Bearing: Weight bearing as tolerated Other Position/Activity Restrictions: posterior hip      Mobility  Bed Mobility Overal bed mobility: Needs Assistance Bed Mobility: Supine to Sit     Supine to sit: Mod assist     General bed mobility comments: mod assist to scoot to side of bed  Transfers Overall transfer level: Needs assistance Equipment used: Rolling walker (2 wheeled);1 person hand held assist Transfers: Sit to/from Stand Sit to Stand: Mod assist         General transfer comment: mod to power up and then min to steady balance  Ambulation/Gait Ambulation/Gait assistance: Min assist Gait Distance (Feet): 4 Feet Assistive device: Rolling walker (2  wheeled);1 person hand held assist Gait Pattern/deviations: Step-to pattern;Decreased stride length;Wide base of support;Trunk flexed Gait velocity: reduced Gait velocity interpretation: <1.31 ft/sec, indicative of household ambulator General Gait Details: with careful control of walker could step to chair with min assist to balance and to maneuver walker, min to control descent  Stairs            Wheelchair Mobility    Modified Rankin (Stroke Patients Only)       Balance Overall balance assessment: History of Falls                                           Pertinent Vitals/Pain Pain Assessment: 0-10 Pain Score: 7  Pain Location: L hip Pain Descriptors / Indicators: Operative site guarding Pain Intervention(s): Limited activity within patient's tolerance;Monitored during session;Premedicated before session;Repositioned;Ice applied    Home Living Family/patient expects to be discharged to:: Private residence Living Arrangements: Children Available Help at Discharge: Family;Available PRN/intermittently Type of Home: House Home Access: Stairs to enter Entrance Stairs-Rails: Can reach both Entrance Stairs-Number of Steps: 5 Home Layout: One level Home Equipment: Walker - 2 wheels;Cane - single point;Shower seat - built in      Prior Function Level of Independence: Independent with assistive device(s)               Hand Dominance   Dominant Hand: Right    Extremity/Trunk Assessment   Upper Extremity Assessment Upper Extremity Assessment: Overall WFL for tasks assessed  Lower Extremity Assessment Lower Extremity Assessment: LLE deficits/detail LLE Deficits / Details: hip weakness and pain LLE: Unable to fully assess due to pain LLE Coordination: decreased fine motor;decreased gross motor    Cervical / Trunk Assessment Cervical / Trunk Assessment: Kyphotic  Communication   Communication: No difficulties  Cognition  Arousal/Alertness: Awake/alert Behavior During Therapy: WFL for tasks assessed/performed Overall Cognitive Status: Within Functional Limits for tasks assessed                                 General Comments: pt could follow instructions for her therapy esp to stand on walker to step to chair      General Comments      Exercises     Assessment/Plan    PT Assessment Patient needs continued PT services  PT Problem List Decreased strength;Decreased range of motion;Decreased activity tolerance;Decreased balance;Decreased mobility;Decreased coordination;Decreased knowledge of use of DME;Decreased safety awareness;Decreased skin integrity;Pain       PT Treatment Interventions DME instruction;Gait training;Stair training;Functional mobility training;Therapeutic activities;Therapeutic exercise;Balance training;Neuromuscular re-education;Patient/family education    PT Goals (Current goals can be found in the Care Plan section)  Acute Rehab PT Goals Patient Stated Goal: to get stronger and reduce pain PT Goal Formulation: With patient Time For Goal Achievement: 10/14/18 Potential to Achieve Goals: Good    Frequency BID   Barriers to discharge Inaccessible home environment;Decreased caregiver support home with sporadic help and with stairs to enter house    Co-evaluation               AM-PAC PT "6 Clicks" Mobility  Outcome Measure Help needed turning from your back to your side while in a flat bed without using bedrails?: A Lot Help needed moving from lying on your back to sitting on the side of a flat bed without using bedrails?: A Lot Help needed moving to and from a bed to a chair (including a wheelchair)?: A Lot Help needed standing up from a chair using your arms (e.g., wheelchair or bedside chair)?: A Lot Help needed to walk in hospital room?: A Lot Help needed climbing 3-5 steps with a railing? : Total 6 Click Score: 11    End of Session Equipment  Utilized During Treatment: Gait belt;Oxygen Activity Tolerance: Patient limited by fatigue;Treatment limited secondary to medical complications (Comment) Patient left: in chair;with call bell/phone within reach;with chair alarm set;with family/visitor present Nurse Communication: Mobility status PT Visit Diagnosis: Unsteadiness on feet (R26.81);Muscle weakness (generalized) (M62.81);Difficulty in walking, not elsewhere classified (R26.2);Pain Pain - Right/Left: Left Pain - part of body: Hip    Time: 5366-4403 PT Time Calculation (min) (ACUTE ONLY): 35 min   Charges:   PT Evaluation $PT Eval Moderate Complexity: 1 Mod PT Treatments $Gait Training: 8-22 mins        Ramond Dial 09/30/2018, 1:04 PM  Mee Hives, PT MS Acute Rehab Dept. Number: [facility]

## 2018-09-30 NOTE — Progress Notes (Signed)
  Subjective: 1 Day Post-Op Procedure(s) (LRB): ARTHROPLASTY BIPOLAR HIP (HEMIARTHROPLASTY) LEFT (Left) Patient reports pain as mild.   Patient is well, and has had no acute complaints or problems Plan is for SNF upon discharge from the hospital. Negative for chest pain and shortness of breath Fever: no Gastrointestinal:Negative for nausea and vomiting  Objective: Vital signs in last 24 hours: Temp:  [97.5 F (36.4 C)-99.5 F (37.5 C)] 98.1 F (36.7 C) (02/08 0801) Pulse Rate:  [64-85] 64 (02/08 0801) Resp:  [13-19] 19 (02/08 0335) BP: (136-195)/(51-74) 153/52 (02/08 0801) SpO2:  [92 %-99 %] 98 % (02/08 0801) Weight:  [79.4 kg] 79.4 kg (02/07 1340)  Intake/Output from previous day:  Intake/Output Summary (Last 24 hours) at 09/30/2018 0947 Last data filed at 09/30/2018 0649 Gross per 24 hour  Intake 1156.12 ml  Output 230 ml  Net 926.12 ml    Intake/Output this shift: No intake/output data recorded.  Labs: Recent Labs    09/28/18 0943 09/29/18 0559 09/30/18 0520  HGB 11.5* 10.8* 9.6*   Recent Labs    09/29/18 0559 09/30/18 0520  WBC 8.1 7.8  RBC 4.10 3.64*  HCT 37.3 32.8*  PLT 177 130*   Recent Labs    09/29/18 0410 09/30/18 0520  NA 137 137  K 3.9 4.1  CL 99 103  CO2 31 30  BUN 16 18  CREATININE 0.54 0.65  GLUCOSE 143* 148*  CALCIUM 9.1 9.0   Recent Labs    09/28/18 1006  INR 0.93     EXAM General - Patient is Alert, Appropriate and Oriented Extremity - ABD soft Neurovascular intact Sensation intact distally Intact pulses distally Dorsiflexion/Plantar flexion intact Incision: dressing C/D/I No cellulitis present Compartment soft Dressing/Incision - clean, dry, no drainage Motor Function - intact, moving foot and toes well on exam.   Past Medical History:  Diagnosis Date  . Acid reflux   . Arthritis   . Asthma   . Depression   . Diabetes ([place])   . Dysrhythmia   . Heart murmur   . HLD (hyperlipidemia)   . HTN (hypertension)   .  Hypotension    when get up too quickly  . Sleep apnea   . Urinary urgency     Assessment/Plan: 1 Day Post-Op Procedure(s) (LRB): ARTHROPLASTY BIPOLAR HIP (HEMIARTHROPLASTY) LEFT (Left) Principal Problem:   Hip fracture ([facility]) Active Problems:   Dizziness  Estimated body mass index is 27.41 kg/m as calculated from the following:   Height as of this encounter: 5\' 7"  (1.702 m).   Weight as of this encounter: 79.4 kg. Advance diet Up with therapy D/C IV fluids when tolerating po intake.  Labs reviewed this AM.  Ht 9.6 this AM, repeat CBC and BMP tomorrow morning. Up with PT today, will likely need SNF. Begin working on BM.  DVT Prophylaxis - Lovenox, Foot Pumps and TED hose Weight-Bearing as tolerated to left leg  J. Cameron Proud, PA-C [facility] 09/30/2018, 9:47 AM

## 2018-09-30 NOTE — Progress Notes (Addendum)
[name] [facility] [place] NAME: Mary [name]    MR#:  [id number]  DATE OF BIRTH:  Aug 06, 1935  SUBJECTIVE:  CHIEF COMPLAINT:   Chief Complaint  Patient presents with  . Fall   Patient seen today Tolerating diet okay Pain in the hip is better No shortness of breath No chest pain  REVIEW OF SYSTEMS:    Review of Systems  Constitutional: Negative for chills and fever.  HENT: Negative for sore throat.   Eyes: Negative for blurred vision, double vision and pain.  Respiratory: Negative for cough, hemoptysis, shortness of breath and wheezing.   Cardiovascular: Negative for chest pain, palpitations, orthopnea and leg swelling.  Gastrointestinal: Negative for abdominal pain, constipation, diarrhea, heartburn, nausea and vomiting.  Genitourinary: Negative for dysuria and hematuria.  Musculoskeletal: Negative for back pain and joint pain.  Skin: Negative for rash.  Neurological: Negative for sensory change, speech change, focal weakness and headaches.  Endo/Heme/Allergies: Does not bruise/bleed easily.  Psychiatric/Behavioral: Negative for depression. The patient is not nervous/anxious.    DRUG ALLERGIES:   Allergies  Allergen Reactions  . Cephalexin Hives  . Nitrofurantoin     Other reaction(s): Other (See Comments) Other Reaction: measles-like lesions  . Sulfa Antibiotics Hives  . Atorvastatin Other (See Comments)    Muscle spasms/ muscle pain    VITALS:  Blood pressure (!) 151/53, pulse 66, temperature 98.2 F (36.8 C), temperature source Oral, resp. rate 19, height 5\' 7"  (1.702 m), weight 79.4 kg, SpO2 100 %.  PHYSICAL EXAMINATION:   Physical Exam  GENERAL:  84 y.o.-year-old patient lying in the bed with no acute distress.  EYES: Pupils equal, round, reactive to light and accommodation. No scleral icterus. Extraocular muscles intact.  HEENT: Head atraumatic, normocephalic. Oropharynx and nasopharynx clear.  NECK:  Supple, no  jugular venous distention. No thyroid enlargement, no tenderness.  LUNGS: Normal breath sounds bilaterally, no wheezing, rales, rhonchi. No use of accessory muscles of respiration.  CARDIOVASCULAR: S1, S2 normal. No murmurs, rubs, or gallops.  ABDOMEN: Soft, nontender, nondistended. Bowel sounds present. No organomegaly or mass.  EXTREMITIES: No cyanosis, clubbing or edema b/l.    Hip pain has decreased NEUROLOGIC: Cranial nerves II through XII are intact. No focal Motor or sensory deficits b/l.   PSYCHIATRIC: The patient is alert and oriented x 3.  SKIN: No obvious rash, lesion, or ulcer.   LABORATORY PANEL:   CBC Recent Labs  Lab 09/30/18 0520  WBC 7.8  HGB 9.6*  HCT 32.8*  PLT 130*   ------------------------------------------------------------------------------------------------------------------ Chemistries  Recent Labs  Lab 09/30/18 0520  NA 137  K 4.1  CL 103  CO2 30  GLUCOSE 148*  BUN 18  CREATININE 0.65  CALCIUM 9.0   ------------------------------------------------------------------------------------------------------------------  Cardiac Enzymes Recent Labs  Lab 09/29/18 0410  TROPONINI <0.03   ------------------------------------------------------------------------------------------------------------------  RADIOLOGY:  Ct Angio Neck W Or Wo Contrast  Result Date: 09/29/2018 CLINICAL DATA:  Dizziness for 2 months. Fall yesterday. Follow-up carotid artery stenosis. History of hyperlipidemia, hypertension and diabetes. EXAM: CT ANGIOGRAPHY NECK TECHNIQUE: Multidetector CT imaging of the neck was performed using the standard protocol during bolus administration of intravenous contrast. Multiplanar CT image reconstructions and MIPs were obtained to evaluate the vascular anatomy. Carotid stenosis measurements (when applicable) are obtained utilizing NASCET criteria, using the distal internal carotid diameter as the denominator. CONTRAST:  2mL OMNIPAQUE IOHEXOL 350  MG/ML SOLN COMPARISON:  CT HEAD September 28, 2018 and carotid ultrasound September 28, 2018 FINDINGS:  AORTIC ARCH: Normal appearance of the thoracic arch, normal branch pattern. Mild calcific atherosclerosis aortic arch. The origins of the innominate, left Common carotid artery and subclavian artery are widely patent. RIGHT CAROTID SYSTEM: Common carotid artery is widely patent, coursing in a straight line fashion. Calcific atherosclerosis resulting in less than 1 cm segment of RIGHT internal carotid artery origin 70% stenosis by NASCET criteria. Patent internal carotid artery with short segment mild beaded appearance mid cervical ICA. LEFT CAROTID SYSTEM: Common carotid artery is widely patent, coursing in a straight line fashion. Moderate calcific atherosclerosis of the carotid bifurcation resulting in less than 50% stenosis by NASCET criteria. Patent internal carotid artery with mildly beaded appearance mid LEFT cervical ICA. Multiple folds associated with chronic hypertension. VERTEBRAL ARTERIES:Left vertebral artery is dominant. Calcific atherosclerosis resulting in mild stenosis RIGHT vertebral artery origin. Patent vertebral arteries with mild extrinsic compression due to degenerative cervical spine. SKELETON: No acute osseous process though bone windows have not been submitted. Straightened cervical lordosis. Grade 1 C3-4 anterolisthesis. Severe cervical spondylosis. Multilevel severe neural foraminal narrowing. OTHER NECK: Soft tissues of the neck are nonacute though, not tailored for evaluation. UPPER CHEST: Included lung apices are clear. No superior mediastinal lymphadenopathy. IMPRESSION: 1. 70% stenosis RIGHT ICA.  Less than 50% stenosis LEFT ICA. 2. Bilateral cervical ICA fibromuscular dysplasia. 3. Mild stenosis RIGHT vertebral artery origin, patent vertebral arteries. Aortic Atherosclerosis (ICD10-I70.0). Electronically Signed   By: Elon Alas M.D.   On: 09/29/2018 13:58   US Carotid  Bilateral  Result Date: 09/28/2018 CLINICAL DATA:  CVA EXAM: BILATERAL CAROTID DUPLEX ULTRASOUND TECHNIQUE: Pearline Cables scale imaging, color Doppler and duplex ultrasound were performed of bilateral carotid and vertebral arteries in the neck. COMPARISON:  None. FINDINGS: Criteria: Quantification of carotid stenosis is based on velocity parameters that correlate the residual internal carotid diameter with NASCET-based stenosis levels, using the diameter of the distal internal carotid lumen as the denominator for stenosis measurement. The following velocity measurements were obtained: RIGHT ICA: 260 cm/sec CCA: 80 cm/sec SYSTOLIC ICA/CCA RATIO:  3.3 ECA: 178 cm/sec LEFT ICA: 106 cm/sec CCA: 409 cm/sec SYSTOLIC ICA/CCA RATIO:  1.0 ECA: 118 cm/sec RIGHT CAROTID ARTERY: There is prominent focal calcified plaque in the bulb with obvious luminal narrowing. Low resistance internal carotid Doppler pattern is preserved. RIGHT VERTEBRAL ARTERY:  Antegrade. LEFT CAROTID ARTERY: Mild smooth soft and calcified plaque in the bulb. Low resistance internal carotid Doppler pattern is preserved. There is moderate plaque at the proximal external carotid artery. LEFT VERTEBRAL ARTERY:  Antegrade IMPRESSION: Greater than 70% stenosis in the right internal carotid artery. Less than 50% stenosis in the left internal carotid artery. Antegrade flow in the right and left vertebral arteries. Electronically Signed   By: Marybelle Killings M.D.   On: 09/28/2018 14:02   Dg Hip Unilat W Or W/o Pelvis 2-3 Views Left  Result Date: 09/29/2018 CLINICAL DATA:  Status post left hip arthroplasty EXAM: DG HIP (WITH OR WITHOUT PELVIS) 2-3V LEFT COMPARISON:  09/28/2018 FINDINGS: There is been interval removal of the left femoral head and placement of unipolar hemiarthroplasty device. The arthroplasty device is in anatomic alignment and there is no periprosthetic fracture or dislocation identified. Unchanged appearance of right hip arthroplasty device. IMPRESSION: 1.  Status post left hip arthroplasty for displaced femoral neck fracture. Electronically Signed   By: Kerby Moors M.D.   On: 09/29/2018 17:01     ASSESSMENT AND PLAN:   *Left hip fracture Status post ORIF procedure Physical therapy evaluation today  Orthopedic surgery follow-up  *Fall with dizziness Status post cardiology evaluation Follow-up echocardiogram  *Carotid stenosis.  Vascular surgery has evaluated the patient.   CTA neck has been reviewed Will need outpatient follow-up  *Hypertension better controlled Continue amlodipine, losartan, give hydralazine as needed.  *Hyperlipidemia Continue lovastatin.  Disposition Based on how patient does with physical therapy  All the records are reviewed and case discussed with Care Management/Social Worker. Management plans discussed with the patient, family and they are in agreement.  CODE STATUS: DNR  DVT Prophylaxis: SCDs  TOTAL TIME TAKING CARE OF THIS PATIENT: 38 minutes.   POSSIBLE D/C IN 2-3 DAYS, DEPENDING ON CLINICAL CONDITION.  Saundra Shelling M.D on 09/30/2018 at 12:44 PM  Between 7am to 6pm - Pager - [phone]  After 6pm go to [email or web address] - password [email or web address] [facility]  Office  [phone]  CC: Primary care physician; Dion Body, MD  Note: This dictation was prepared with Dragon dictation along with smaller phrase technology. Any transcriptional errors that result from this process are unintentional.

## 2018-10-01 LAB — CBC
HCT: 32.5 % — ABNORMAL LOW (ref 36.0–46.0)
Hemoglobin: 9.4 g/dL — ABNORMAL LOW (ref 12.0–15.0)
MCH: 26.5 pg (ref 26.0–34.0)
MCHC: 28.9 g/dL — ABNORMAL LOW (ref 30.0–36.0)
MCV: 91.5 fL (ref 80.0–100.0)
Platelets: 145 10*3/uL — ABNORMAL LOW (ref 150–400)
RBC: 3.55 MIL/uL — ABNORMAL LOW (ref 3.87–5.11)
RDW: 16.8 % — ABNORMAL HIGH (ref 11.5–15.5)
WBC: 7.4 10*3/uL (ref 4.0–10.5)
nRBC: 0 % (ref 0.0–0.2)

## 2018-10-01 LAB — BASIC METABOLIC PANEL
Anion gap: 5 (ref 5–15)
BUN: 14 mg/dL (ref 8–23)
CHLORIDE: 104 mmol/L (ref 98–111)
CO2: 32 mmol/L (ref 22–32)
Calcium: 8.9 mg/dL (ref 8.9–10.3)
Creatinine, Ser: 0.49 mg/dL (ref 0.44–1.00)
GFR calc Af Amer: >60 mL/min (ref >60)
GFR calc non Af Amer: >60 mL/min (ref >60)
Glucose, Bld: 130 mg/dL — ABNORMAL HIGH (ref 70–99)
POTASSIUM: 3.8 mmol/L (ref 3.5–5.1)
Sodium: 141 mmol/L (ref 135–145)

## 2018-10-01 LAB — GLUCOSE, CAPILLARY
Glucose-Capillary: 121 mg/dL — ABNORMAL HIGH (ref 70–99)
Glucose-Capillary: 100 mg/dL — ABNORMAL HIGH (ref 70–99)
Glucose-Capillary: 118 mg/dL — ABNORMAL HIGH (ref 70–99)
Glucose-Capillary: 128 mg/dL — ABNORMAL HIGH (ref 70–99)

## 2018-10-01 MED ORDER — POLYETHYLENE GLYCOL 3350 17 G PO PACK
17.0000 g | PACK | Freq: Every day | ORAL | Status: DC
  Administered 2018-10-02: 17 g via ORAL
  Filled 2018-10-01: qty 1

## 2018-10-01 NOTE — Progress Notes (Signed)
[facility] NAME: Mary [name]    MR#:  [id number]  DATE OF BIRTH:  12-09-1934  SUBJECTIVE:  CHIEF COMPLAINT:   Chief Complaint  Patient presents with  . Fall   Patient seen today Tolerating diet okay Has constipation Pain in the left hip is better No shortness of breath No chest pain Had some low-grade temperature once  REVIEW OF SYSTEMS:   Review of Systems  Constitutional: Low-grade fever HENT: Negative.   Eyes: Negative.   Respiratory: Negative.   Cardiovascular: Negative.   Gastrointestinal: Negative.   Genitourinary: Negative.   Musculoskeletal: Negative.   Skin: Negative.   Neurological: Negative.   Endo/Heme/Allergies: Negative.   Psychiatric/Behavioral: Negative.   All other systems reviewed and are negative.  DRUG ALLERGIES:   Allergies  Allergen Reactions  . Cephalexin Hives  . Nitrofurantoin     Other reaction(s): Other (See Comments) Other Reaction: measles-like lesions  . Sulfa Antibiotics Hives  . Atorvastatin Other (See Comments)    Muscle spasms/ muscle pain    VITALS:  Blood pressure (!) 156/56, pulse 80, temperature (!) 100.4 F (38 C), temperature source Oral, resp. rate 19, height 5\' 7"  (1.702 m), weight 79.4 kg, SpO2 96 %.  PHYSICAL EXAMINATION:   Physical Exam  GENERAL:  84 y.o.-year-old patient lying in the bed with no acute distress.  EYES: Pupils equal, round, reactive to light and accommodation. No scleral icterus. Extraocular muscles intact.  HEENT: Head atraumatic, normocephalic. Oropharynx and nasopharynx clear.  NECK:  Supple, no jugular venous distention. No thyroid enlargement, no tenderness.  LUNGS: Normal breath sounds bilaterally, no wheezing, rales, rhonchi. No use of accessory muscles of respiration.  CARDIOVASCULAR: S1, S2 normal. No murmurs, rubs, or gallops.  ABDOMEN: Soft, nontender, nondistended. Bowel sounds present. No organomegaly or mass.  EXTREMITIES: No  cyanosis, clubbing or edema b/l.    Left hip bandage noted NEUROLOGIC: Cranial nerves II through XII are intact. No focal Motor or sensory deficits b/l.   PSYCHIATRIC: The patient is alert and oriented x 3.  SKIN: No obvious rash, lesion, or ulcer.   LABORATORY PANEL:   CBC Recent Labs  Lab 10/01/18 0338  WBC 7.4  HGB 9.4*  HCT 32.5*  PLT 145*   ------------------------------------------------------------------------------------------------------------------ Chemistries  Recent Labs  Lab 10/01/18 0338  NA 141  K 3.8  CL 104  CO2 32  GLUCOSE 130*  BUN 14  CREATININE 0.49  CALCIUM 8.9   ------------------------------------------------------------------------------------------------------------------  Cardiac Enzymes Recent Labs  Lab 09/29/18 0410  TROPONINI <0.03   ------------------------------------------------------------------------------------------------------------------  RADIOLOGY:  Ct Angio Neck W Or Wo Contrast  Result Date: 09/29/2018 CLINICAL DATA:  Dizziness for 2 months. Fall yesterday. Follow-up carotid artery stenosis. History of hyperlipidemia, hypertension and diabetes. EXAM: CT ANGIOGRAPHY NECK TECHNIQUE: Multidetector CT imaging of the neck was performed using the standard protocol during bolus administration of intravenous contrast. Multiplanar CT image reconstructions and MIPs were obtained to evaluate the vascular anatomy. Carotid stenosis measurements (when applicable) are obtained utilizing NASCET criteria, using the distal internal carotid diameter as the denominator. CONTRAST:  64mL OMNIPAQUE IOHEXOL 350 MG/ML SOLN COMPARISON:  CT HEAD September 28, 2018 and carotid ultrasound September 28, 2018 FINDINGS: AORTIC ARCH: Normal appearance of the thoracic arch, normal branch pattern. Mild calcific atherosclerosis aortic arch. The origins of the innominate, left Common carotid artery and subclavian artery are widely patent. RIGHT CAROTID SYSTEM: Common  carotid artery is widely patent, coursing in a straight line fashion. Calcific  atherosclerosis resulting in less than 1 cm segment of RIGHT internal carotid artery origin 70% stenosis by NASCET criteria. Patent internal carotid artery with short segment mild beaded appearance mid cervical ICA. LEFT CAROTID SYSTEM: Common carotid artery is widely patent, coursing in a straight line fashion. Moderate calcific atherosclerosis of the carotid bifurcation resulting in less than 50% stenosis by NASCET criteria. Patent internal carotid artery with mildly beaded appearance mid LEFT cervical ICA. Multiple folds associated with chronic hypertension. VERTEBRAL ARTERIES:Left vertebral artery is dominant. Calcific atherosclerosis resulting in mild stenosis RIGHT vertebral artery origin. Patent vertebral arteries with mild extrinsic compression due to degenerative cervical spine. SKELETON: No acute osseous process though bone windows have not been submitted. Straightened cervical lordosis. Grade 1 C3-4 anterolisthesis. Severe cervical spondylosis. Multilevel severe neural foraminal narrowing. OTHER NECK: Soft tissues of the neck are nonacute though, not tailored for evaluation. UPPER CHEST: Included lung apices are clear. No superior mediastinal lymphadenopathy. IMPRESSION: 1. 70% stenosis RIGHT ICA.  Less than 50% stenosis LEFT ICA. 2. Bilateral cervical ICA fibromuscular dysplasia. 3. Mild stenosis RIGHT vertebral artery origin, patent vertebral arteries. Aortic Atherosclerosis (ICD10-I70.0). Electronically Signed   By: Elon Alas M.D.   On: 09/29/2018 13:58   Dg Hip Unilat W Or W/o Pelvis 2-3 Views Left  Result Date: 09/29/2018 CLINICAL DATA:  Status post left hip arthroplasty EXAM: DG HIP (WITH OR WITHOUT PELVIS) 2-3V LEFT COMPARISON:  09/28/2018 FINDINGS: There is been interval removal of the left femoral head and placement of unipolar hemiarthroplasty device. The arthroplasty device is in anatomic alignment and  there is no periprosthetic fracture or dislocation identified. Unchanged appearance of right hip arthroplasty device. IMPRESSION: 1. Status post left hip arthroplasty for displaced femoral neck fracture. Electronically Signed   By: Kerby Moors M.D.   On: 09/29/2018 17:01     ASSESSMENT AND PLAN:   *Left hip fracture Status post ORIF procedure Physical therapy evaluation done SNF placement Social worker follow-up Orthopedic surgery follow-up appreciated Had low-grade temperature, no evidence of infection Will monitor patient clinically  *Fall with dizziness Status post cardiology evaluation Echocardiogram reviewed EF 60 to 32% Normal systolic function  *Carotid stenosis.  Vascular surgery has evaluated the patient.   CTA neck has been reviewed Will need outpatient follow-up  *Hypertension better controlled Continue amlodipine, losartan, give hydralazine as needed.  *Hyperlipidemia Continue lovastatin.  Disposition SNF once we get insurance authorization  All the records are reviewed and case discussed with Care Management/Social Worker. Management plans discussed with the patient, family and they are in agreement.  CODE STATUS: DNR  DVT Prophylaxis: SCDs  TOTAL TIME TAKING CARE OF THIS PATIENT: 38 minutes.   POSSIBLE D/C IN 2-3 DAYS, DEPENDING ON CLINICAL CONDITION.  Saundra Shelling M.D on 10/01/2018 at 11:25 AM  Between 7am to 6pm - Pager - [phone]  After 6pm go to [email or web address] - password [email or web address] [facility]  Office  [phone]  CC: Primary care physician; Dion Body, MD  Note: This dictation was prepared with Dragon dictation along with smaller phrase technology. Any transcriptional errors that result from this process are unintentional.

## 2018-10-01 NOTE — Progress Notes (Signed)
  Subjective: 2 Days Post-Op Procedure(s) (LRB): ARTHROPLASTY BIPOLAR HIP (HEMIARTHROPLASTY) LEFT (Left) Patient reports pain as mild.   Patient is well, and has had no acute complaints or problems Plan is for SNF upon discharge from the hospital. Negative for chest pain and shortness of breath Fever: 100 temp this AM. Gastrointestinal:Negative for nausea and vomiting  Objective: Vital signs in last 24 hours: Temp:  [97.9 F (36.6 C)-100.4 F (38 C)] 100.4 F (38 C) (02/09 0732) Pulse Rate:  [62-79] 79 (02/09 0732) Resp:  [19] 19 (02/09 0016) BP: (130-191)/(53-71) 180/65 (02/09 0732) SpO2:  [92 %-100 %] 95 % (02/09 0732)  Intake/Output from previous day:  Intake/Output Summary (Last 24 hours) at 10/01/2018 0914 Last data filed at 10/01/2018 0300 Gross per 24 hour  Intake 600 ml  Output -  Net 600 ml    Intake/Output this shift: No intake/output data recorded.  Labs: Recent Labs    09/28/18 0943 09/29/18 0559 09/30/18 0520 10/01/18 0338  HGB 11.5* 10.8* 9.6* 9.4*   Recent Labs    09/30/18 0520 10/01/18 0338  WBC 7.8 7.4  RBC 3.64* 3.55*  HCT 32.8* 32.5*  PLT 130* 145*   Recent Labs    09/30/18 0520 10/01/18 0338  NA 137 141  K 4.1 3.8  CL 103 104  CO2 30 32  BUN 18 14  CREATININE 0.65 0.49  GLUCOSE 148* 130*  CALCIUM 9.0 8.9   Recent Labs    09/28/18 1006  INR 0.93     EXAM General - Patient is Alert, Appropriate and Oriented Extremity - ABD soft Neurovascular intact Sensation intact distally Intact pulses distally Dorsiflexion/Plantar flexion intact Incision: dressing C/D/I No cellulitis present Compartment soft Dressing/Incision - clean, dry, no drainage Motor Function - intact, moving foot and toes well on exam.   Past Medical History:  Diagnosis Date  . Acid reflux   . Arthritis   . Asthma   . Depression   . Diabetes ([place])   . Dysrhythmia   . Heart murmur   . HLD (hyperlipidemia)   . HTN (hypertension)   . Hypotension    when get up too quickly  . Sleep apnea   . Urinary urgency     Assessment/Plan: 2 Days Post-Op Procedure(s) (LRB): ARTHROPLASTY BIPOLAR HIP (HEMIARTHROPLASTY) LEFT (Left) Principal Problem:   Hip fracture (HCC) Active Problems:   Dizziness  Estimated body mass index is 27.41 kg/m as calculated from the following:   Height as of this encounter: 5\' 7"  (1.702 m).   Weight as of this encounter: 79.4 kg. Advance diet Up with therapy  Labs reviewed this AM.  Ht 9.4 this AM, repeat CBC and BMP tomorrow morning. 100.4 Temp this AM, WBC 7.4, no signs of infection to the left hip.  Denies any SOB or urinary symptoms this AM. Up with PT today, current plan is for discharge to SNF. Begin working on BM.  DVT Prophylaxis - Lovenox, Foot Pumps and TED hose Weight-Bearing as tolerated to left leg  J. Cameron Proud, PA-C [facility] Orthopaedic Surgery 10/01/2018, 9:14 AM

## 2018-10-01 NOTE — Progress Notes (Signed)
Physical Therapy Treatment Patient Details Name: Mary [name] MRN: [id number] DOB: Nov 03, 1934 Today's Date: 10/01/2018    History of Present Illness 84 yo female with onset of fall with L femoral neck fracture was given hemiarthroplasty on 09/29/18,  with posterior hip precautions and WBAT for rehab today.  PMHx:  R THA, L TKA, HTN, sleep apnea, DM    PT Comments    Patient is able to increase her stride length and overall gait distance this session with minimal increase in discomfort. She is still requiring significant assistance to complete bed mobility, transfers, and ambulation and would likely benefit from skilled PT services to address her mobility deficits. She is still demonstrating deficit in quadricep and functional LE strength as demonstrated by difficulty with SLRs and quad sets on her LLE.   Follow Up Recommendations  SNF     Equipment Recommendations  None recommended by PT    Recommendations for Other Services       Precautions / Restrictions Precautions Precautions: Fall;Posterior Hip Precaution Booklet Issued: No Precaution Comments: verbally reviewed Required Braces or Orthoses: Other Brace(abd pillow) Restrictions Weight Bearing Restrictions: Yes LLE Weight Bearing: Weight bearing as tolerated Other Position/Activity Restrictions: posterior hip    Mobility  Bed Mobility Overal bed mobility: Needs Assistance Bed Mobility: Supine to Sit     Supine to sit: Mod assist     General bed mobility comments: Patient required assistance to transfer LEs and maintain erect torso during transfer.   Transfers Overall transfer level: Needs assistance Equipment used: Rolling walker (2 wheeled) Transfers: Sit to/from Stand Sit to Stand: Mod assist;Min assist         General transfer comment: Patient required assistance to complete transfer with HHA.  Ambulation/Gait Ambulation/Gait assistance: Min assist Gait Distance (Feet): 10 Feet Assistive device: Rolling  walker (2 wheeled);1 person hand held assist Gait Pattern/deviations: Step-to pattern;Step-through pattern;Decreased stride length Gait velocity: reduced Gait velocity interpretation: <1.8 ft/sec, indicate of risk for recurrent falls General Gait Details: Patient cued on taking appropriate step length, cued for appropriate loading/WBing.    Stairs             Wheelchair Mobility    Modified Rankin (Stroke Patients Only)       Balance Overall balance assessment: History of Falls;Needs assistance Sitting-balance support: Feet supported Sitting balance-Leahy Scale: Good     Standing balance support: Bilateral upper extremity supported Standing balance-Leahy Scale: Fair                              Cognition Arousal/Alertness: Awake/alert Behavior During Therapy: WFL for tasks assessed/performed Overall Cognitive Status: Within Functional Limits for tasks assessed                                        Exercises Total Joint Exercises Ankle Circles/Pumps: AROM;15 reps;Both Quad Sets: AROM;15 reps;Both Heel Slides: AAROM;15 reps;Both Hip ABduction/ADduction: AROM;15 reps;Both Straight Leg Raises: AAROM;15 reps;Both Long Arc Quad: AAROM;15 reps;Both    General Comments        Pertinent Vitals/Pain Pain Assessment: Faces Faces Pain Scale: Hurts a little bit Pain Location: L hip Pain Descriptors / Indicators: Aching;Operative site guarding Pain Intervention(s): Limited activity within patient's tolerance;Repositioned;Monitored during session    Home Living  Prior Function            PT Goals (current goals can now be found in the care plan section) Acute Rehab PT Goals Patient Stated Goal: to get stronger and reduce pain PT Goal Formulation: With patient Time For Goal Achievement: 10/14/18 Potential to Achieve Goals: Good Progress towards PT goals: Progressing toward goals    Frequency     BID      PT Plan Current plan remains appropriate    Co-evaluation              AM-PAC PT "6 Clicks" Mobility   Outcome Measure  Help needed turning from your back to your side while in a flat bed without using bedrails?: A Little Help needed moving from lying on your back to sitting on the side of a flat bed without using bedrails?: A Little Help needed moving to and from a bed to a chair (including a wheelchair)?: A Lot Help needed standing up from a chair using your arms (e.g., wheelchair or bedside chair)?: A Lot Help needed to walk in hospital room?: A Lot Help needed climbing 3-5 steps with a railing? : Total 6 Click Score: 13    End of Session Equipment Utilized During Treatment: Gait belt;Oxygen Activity Tolerance: Patient limited by fatigue;Patient tolerated treatment well Patient left: with call bell/phone within reach;in chair;with chair alarm set Nurse Communication: Mobility status PT Visit Diagnosis: Unsteadiness on feet (R26.81);Muscle weakness (generalized) (M62.81);Difficulty in walking, not elsewhere classified (R26.2);Pain Pain - Right/Left: Left Pain - part of body: Hip     Time: 0300-9233 PT Time Calculation (min) (ACUTE ONLY): 28 min  Charges:  $Gait Training: 8-22 mins $Therapeutic Exercise: 8-22 mins                     Royce Macadamia PT, DPT, CSCS   10/01/2018, 11:31 AM

## 2018-10-02 ENCOUNTER — Encounter: Payer: Self-pay | Admitting: Surgery

## 2018-10-02 LAB — BASIC METABOLIC PANEL
Anion gap: 6 (ref 5–15)
BUN: 11 mg/dL (ref 8–23)
CO2: 32 mmol/L (ref 22–32)
Calcium: 8.9 mg/dL (ref 8.9–10.3)
Chloride: 101 mmol/L (ref 98–111)
Creatinine, Ser: 0.46 mg/dL (ref 0.44–1.00)
GFR calc Af Amer: >60 mL/min (ref >60)
GFR calc non Af Amer: >60 mL/min (ref >60)
Glucose, Bld: 124 mg/dL — ABNORMAL HIGH (ref 70–99)
Potassium: 3.6 mmol/L (ref 3.5–5.1)
Sodium: 139 mmol/L (ref 135–145)

## 2018-10-02 LAB — CBC
HCT: 33.1 % — ABNORMAL LOW (ref 36.0–46.0)
Hemoglobin: 9.6 g/dL — ABNORMAL LOW (ref 12.0–15.0)
MCH: 26.6 pg (ref 26.0–34.0)
MCHC: 29 g/dL — AB (ref 30.0–36.0)
MCV: 91.7 fL (ref 80.0–100.0)
Platelets: 168 10*3/uL (ref 150–400)
RBC: 3.61 MIL/uL — ABNORMAL LOW (ref 3.87–5.11)
RDW: 17.2 % — ABNORMAL HIGH (ref 11.5–15.5)
WBC: 7.4 10*3/uL (ref 4.0–10.5)
nRBC: 0 % (ref 0.0–0.2)

## 2018-10-02 LAB — GLUCOSE, CAPILLARY
Glucose-Capillary: 106 mg/dL — ABNORMAL HIGH (ref 70–99)
Glucose-Capillary: 136 mg/dL — ABNORMAL HIGH (ref 70–99)

## 2018-10-02 MED ORDER — HYDRALAZINE HCL 25 MG PO TABS
25.0000 mg | ORAL_TABLET | Freq: Once | ORAL | Status: AC
  Administered 2018-10-02: 25 mg via ORAL
  Filled 2018-10-02: qty 1

## 2018-10-02 MED ORDER — SENNOSIDES-DOCUSATE SODIUM 8.6-50 MG PO TABS: 1.0000 | ORAL_TABLET | Freq: Two times a day (BID) | ORAL | 0 refills | Status: AC

## 2018-10-02 MED ORDER — HYDRALAZINE HCL 25 MG PO TABS: 25.0000 mg | ORAL_TABLET | Freq: Four times a day (QID) | ORAL | 0 refills | Status: DC

## 2018-10-02 MED ORDER — TRAMADOL HCL 50 MG PO TABS: 50.0000 mg | ORAL_TABLET | Freq: Four times a day (QID) | ORAL | 0 refills | Status: DC | PRN

## 2018-10-02 MED ORDER — POLYETHYLENE GLYCOL 3350 17 G PO PACK: 17.0000 g | PACK | Freq: Every day | ORAL | 0 refills | Status: DC

## 2018-10-02 MED ORDER — ASPIRIN EC 81 MG PO TBEC: 81.0000 mg | DELAYED_RELEASE_TABLET | Freq: Every day | ORAL | 0 refills | Status: AC

## 2018-10-02 MED ORDER — ENOXAPARIN SODIUM 40 MG/0.4ML ~~LOC~~ SOLN: 40.0000 mg | SUBCUTANEOUS | 0 refills | Status: DC

## 2018-10-02 MED ORDER — OXYCODONE HCL 5 MG PO TABS: 5.0000 mg | ORAL_TABLET | ORAL | 0 refills | Status: DC | PRN

## 2018-10-02 NOTE — Progress Notes (Signed)
Physical Therapy Treatment Patient Details Name: Mary [name] MRN: [id number] DOB: 11-12-1934 Today's Date: 10/02/2018    History of Present Illness 84 yo female with onset of fall with L femoral neck fracture was given hemiarthroplasty on 09/29/18,  with posterior hip precautions and WBAT for rehab today.  PMHx:  R THA, L TKA, HTN, sleep apnea, DM    PT Comments    Pt agreeable to PT although notes she is extremely nauseated currently; reports receiving medication. Pt denies pain in L hip. Pt participates in limited seated and L stand exercises. Remember 2/3 posterior hip precautions and requires verbal/tactile cueing to apply to functional mobility. Performs well with cues/education. Mod A to stand and Min A to sit from/to recliner. Denies pain with functional mobility. Stand exercises increase nausea/dry heeves requiring pt to cease further exercises. Pt remains up in chair. Pt with current discharge order to skilled nursing facility to continue rehab efforts.   Follow Up Recommendations  SNF     Equipment Recommendations  None recommended by PT    Recommendations for Other Services       Precautions / Restrictions Precautions Precautions: Fall;Posterior Hip Precaution Comments: Remembers 2/3 from memory; requires cues to remember hip flexion < 90 deg.  Restrictions Weight Bearing Restrictions: Yes LLE Weight Bearing: Weight bearing as tolerated    Mobility  Bed Mobility               General bed mobility comments: Already up in chair  Transfers Overall transfer level: Needs assistance Equipment used: Rolling walker (2 wheeled) Transfers: Sit to/from Stand Sit to Stand: Mod assist;Min assist         General transfer comment: mod to stand; min to sit. Verbal/tactile cues to adhere to posterior hip precautions. Good correction with cues.   Ambulation/Gait             General Gait Details: unable today due to nausea/heeves   Stairs              Wheelchair Mobility    Modified Rankin (Stroke Patients Only)       Balance Overall balance assessment: History of Falls;Needs assistance Sitting-balance support: Feet supported Sitting balance-Leahy Scale: Good     Standing balance support: Bilateral upper extremity supported Standing balance-Leahy Scale: Fair                              Cognition Arousal/Alertness: Awake/alert Behavior During Therapy: WFL for tasks assessed/performed Overall Cognitive Status: Within Functional Limits for tasks assessed                                 General Comments: pt feeling very nauseated today; limited session due to pt with dry heeves      Exercises Total Joint Exercises Ankle Circles/Pumps: AROM;Both;20 reps;Seated Straight Leg Raises: Strengthening;Left;10 reps;Standing Long Arc Quad: Strengthening;Both;15 reps;Seated Marching in Standing: Strengthening;Left;10 reps;Standing    General Comments        Pertinent Vitals/Pain Pain Assessment: No/denies pain    Home Living                      Prior Function            PT Goals (current goals can now be found in the care plan section) Progress towards PT goals: Progressing toward goals(slow due to nausea)  Frequency           PT Plan Current plan remains appropriate    Co-evaluation              AM-PAC PT "6 Clicks" Mobility   Outcome Measure  Help needed turning from your back to your side while in a flat bed without using bedrails?: A Little Help needed moving from lying on your back to sitting on the side of a flat bed without using bedrails?: A Little Help needed moving to and from a bed to a chair (including a wheelchair)?: A Lot Help needed standing up from a chair using your arms (e.g., wheelchair or bedside chair)?: A Lot Help needed to walk in hospital room?: A Lot Help needed climbing 3-5 steps with a railing? : Total 6 Click Score: 13    End of  Session Equipment Utilized During Treatment: Gait belt Activity Tolerance: Other (comment)(limited by nausea/heeves) Patient left: in chair;with call bell/phone within reach;with chair alarm set   PT Visit Diagnosis: Unsteadiness on feet (R26.81);Muscle weakness (generalized) (M62.81);Difficulty in walking, not elsewhere classified (R26.2);Pain Pain - Right/Left: Left Pain - part of body: Hip     Time: 2423-5361 PT Time Calculation (min) (ACUTE ONLY): 16 min  Charges:  $Therapeutic Exercise: 8-22 mins                      Larae Grooms, PTA 10/02/2018, 10:56 AM

## 2018-10-02 NOTE — Discharge Planning (Signed)
Patient IV removed.  RN assessment and VS revealed stability for DC to [facility].  Report called and s/w Clearnce Hasten, LPN. Discharge papers printed and placed in facility packet with signed [facility] DNR and signed pain scripts.  EMS contacted to transport to room 701. Waiting on arrival.

## 2018-10-02 NOTE — Clinical Social Work Note (Signed)
Patient is medically ready for discharge today. CSW notified patient's daughter Hampton Cost [phone] of discharge today to [facility]. CSW also notified [facility] at [facility] of discharge today. Patient will be transported by EMS. RN to call report and call for transport.   [place], [place]

## 2018-10-02 NOTE — Discharge Summary (Signed)
[facility] NAME: Mary [name]    MR#:  [id number]  DATE OF BIRTH:  1934-09-05  DATE OF ADMISSION:  09/28/2018 ADMITTING PHYSICIAN: Vaughan Basta, MD  DATE OF DISCHARGE: 10/02/2018  PRIMARY CARE PHYSICIAN: Dion Body, MD   ADMISSION DIAGNOSIS:  Cerebral infarction ([place]) [I63.9] CVA (cerebral infarction) [I63.9] Closed fracture of neck of left femur, initial encounter ([place]) [S72.002A]  DISCHARGE DIAGNOSIS:  Left hip fracture Carotid artery stenosis Uncontrolled hypertension Hyperlipidemia    SECONDARY DIAGNOSIS:   Past Medical History:  Diagnosis Date  . Acid reflux   . Arthritis   . Asthma   . Depression   . Diabetes ([place])   . Dysrhythmia   . Heart murmur   . HLD (hyperlipidemia)   . HTN (hypertension)   . Hypotension    when get up too quickly  . Sleep apnea   . Urinary urgency      ADMITTING HISTORY Mary [name]  is a 84 y.o. female with a known history of acid reflux, asthma, depression, diabetes, hyperlipidemia, hypertension, sleep apnea-lives with her daughter at home and independent in day-to-day activity and walking.For last few weeks she had episodes where she feels dizzy which is not related to her position but usually happens when she is sitting or standing.  Also associated palpitation with these episodes but denies any chest pain or shortness of breath.  She feels she would pass out with this type of episodes but generally hold onto something and sit down and feels better within few seconds or minutes.  She never had actually loss of consciousness with these episodes so far.  She denies any tingling or numbness associated with these episodes.She also have feeling of shortness of breath with minimal exertion like walking from parking lot to the store when she go for grocery shopping needs and she has to take a break to catch her breath.  She denies any signs of orthopnea.Today morning while walking  to bathroom she had 1 similar episode and she lost her balance and fell down while hitting her head to a chair.  She did not lose her consciousness.  But she was feeling significant pain in her left hip after this fall.In ER she was noted to have left hip fracture so given to hospitalist team for further management.   HOSPITAL COURSE:  Patient was admitted to medical floor.. Patient seen by orthopedic surgery and vascular surgery.  Patient was worked up with CT head, CTA head and neck.  Carotid ultrasound revealed a carotid artery stenosis follow-up with vascular surgery as outpatient.  Open reduction internal fixation of the hip fracture was completed during hospitalization.  Patient tolerated procedure well.  Cardiology evaluated the patient and gave clearance for the procedure.  Patient received physical therapy and rehab recommendation was implemented.  Social worker was consulted [facility]  Patient received Lovenox 40 mg subcutaneously for DVT prophylaxis.  During the hospitalization blood pressure was well controlled.  Her dizziness also resolved.  CONSULTS OBTAINED:  Treatment Team:  Poggi, Marshall Cork, MD Minna Merritts, MD  DRUG ALLERGIES:   Allergies  Allergen Reactions  . Cephalexin Hives  . Nitrofurantoin     Other reaction(s): Other (See Comments) Other Reaction: measles-like lesions  . Sulfa Antibiotics Hives  . Atorvastatin Other (See Comments)    Muscle spasms/ muscle pain    DISCHARGE MEDICATIONS:   Allergies as of 10/02/2018  Reactions   Cephalexin Hives   Nitrofurantoin    Other reaction(s): Other (See Comments) Other Reaction: measles-like lesions   Sulfa Antibiotics Hives   Atorvastatin Other (See Comments)   Muscle spasms/ muscle pain      Medication List    TAKE these medications   acetaminophen 500 MG tablet Commonly known as:  TYLENOL Take 500 mg by mouth every 6 (six) hours as needed.   amLODipine 10 MG  tablet Commonly known as:  NORVASC Take 10 mg by mouth daily at 2 PM.   ARTIFICIAL TEARS OP Place 1 drop into both eyes daily as needed (dry eyes).   busPIRone 10 MG tablet Commonly known as:  BUSPAR Take 20 mg by mouth 2 (two) times daily.   enoxaparin 40 MG/0.4ML injection Commonly known as:  LOVENOX Inject 0.4 mLs (40 mg total) into the skin daily for 14 days. Start taking on:  October 03, 2018   furosemide 20 MG tablet Commonly known as:  LASIX Take 20 mg by mouth daily as needed for edema.   hydrALAZINE 25 MG tablet Commonly known as:  APRESOLINE Take 1 tablet (25 mg total) by mouth 4 (four) times daily for 30 days.   levothyroxine 75 MCG tablet Commonly known as:  SYNTHROID, LEVOTHROID Take 75 mcg by mouth daily.   losartan 100 MG tablet Commonly known as:  COZAAR Take 100 mg by mouth daily.   lovastatin 20 MG tablet Commonly known as:  MEVACOR Take 20 mg by mouth 3 (three) times a week.   metFORMIN 500 MG 24 hr tablet Commonly known as:  GLUCOPHAGE-XR Take 500 mg by mouth daily.   omeprazole 40 MG capsule Commonly known as:  PRILOSEC Take 40 mg by mouth daily.   oxyCODONE 5 MG immediate release tablet Commonly known as:  Oxy IR/ROXICODONE Take 1-2 tablets (5-10 mg total) by mouth every 4 (four) hours as needed for severe pain or breakthrough pain (pain score 4-6).   PARoxetine 40 MG tablet Commonly known as:  PAXIL Take 40 mg by mouth every evening.   polyethylene glycol packet Commonly known as:  MIRALAX / GLYCOLAX Take 17 g by mouth daily. Start taking on:  October 03, 2018   senna-docusate 8.6-50 MG tablet Commonly known as:  Senokot-S Take 1 tablet by mouth 2 (two) times daily for 30 days.   solifenacin 10 MG tablet Commonly known as:  VESICARE Take 1 tablet (10 mg total) by mouth daily.   traMADol 50 MG tablet Commonly known as:  ULTRAM Take 1 tablet (50 mg total) by mouth every 6 (six) hours as needed for moderate pain.   vitamin E  400 UNIT capsule Take 400 Units by mouth 4 (four) times a week.       Today  Patient seen today No complaints of any chest pain No shortness of breath Tolerating diet well Hemodynamically stable VITAL SIGNS:  Blood pressure (!) 192/86, pulse 82, temperature 98.6 F (37 C), temperature source Oral, resp. rate 16, height 5\' 7"  (1.702 m), weight 79.4 kg, SpO2 91 %.  I/O:    Intake/Output Summary (Last 24 hours) at 10/02/2018 1005 Last data filed at 10/02/2018 0413 Gross per 24 hour  Intake 480 ml  Output -  Net 480 ml    PHYSICAL EXAMINATION:  Physical Exam  GENERAL:  84 y.o.-year-old patient lying in the bed with no acute distress.  LUNGS: Normal breath sounds bilaterally, no wheezing, rales,rhonchi or crepitation. No use of accessory muscles of respiration.  CARDIOVASCULAR: S1, S2 normal. No murmurs, rubs, or gallops.  ABDOMEN: Soft, non-tender, non-distended. Bowel sounds present. No organomegaly or mass.  NEUROLOGIC: Moves all 4 extremities. PSYCHIATRIC: The patient is alert and oriented x 3.  SKIN: No obvious rash, lesion, or ulcer.   DATA REVIEW:   CBC Recent Labs  Lab 10/02/18 0348  WBC 7.4  HGB 9.6*  HCT 33.1*  PLT 168    Chemistries  Recent Labs  Lab 10/02/18 0348  NA 139  K 3.6  CL 101  CO2 32  GLUCOSE 124*  BUN 11  CREATININE 0.46  CALCIUM 8.9    Cardiac Enzymes Recent Labs  Lab 09/29/18 0410  TROPONINI <0.03    Microbiology Results  Results for orders placed or performed during the hospital encounter of 09/28/18  Surgical PCR screen     Status: None   Collection Time: 09/28/18  8:12 PM  Result Value Ref Range Status   MRSA, PCR NEGATIVE NEGATIVE Final   Staphylococcus aureus NEGATIVE NEGATIVE Final    Comment: (NOTE) The Xpert SA Assay (FDA approved for NASAL specimens in patients 10 years of age and older), is one component of a comprehensive surveillance program. It is not intended to diagnose infection nor to guide or monitor  treatment. Performed at [facility], [address] [place], [place] [address]     RADIOLOGY:  No results found.  Follow up with PCP in 1 week.  Management plans discussed with the patient, family and they are in agreement.  CODE STATUS: DNR    Code Status Orders  (From admission, onward)         Start     Ordered   09/28/18 1717  Do not attempt resuscitation (DNR)  Continuous    Question Answer Comment  In the event of cardiac or respiratory ARREST Do not call a "code blue"   In the event of cardiac or respiratory ARREST Do not perform Intubation, CPR, defibrillation or ACLS   In the event of cardiac or respiratory ARREST Use medication by any route, position, wound care, and other measures to relive pain and suffering. May use oxygen, suction and manual treatment of airway obstruction as needed for comfort.      09/28/18 1716        Code Status History    Date Active Date Inactive Code Status Order ID Comments User Context   09/28/2018 1325 09/28/2018 1716 DNR [id number]  Vaughan Basta, MD ED   12/01/2017 1259 12/05/2017 0402 Full Code [id number]  Poggi, Marshall Cork, MD Inpatient   05/19/2017 2114 05/21/2017 1555 Full Code [id number]  Henreitta Leber, MD Inpatient   09/27/2015 0104 09/27/2015 1749 Full Code [id number]  Saundra Shelling, MD Inpatient   09/24/2015 0340 09/26/2015 2015 Full Code [id number]  Lance Coon, MD Inpatient    Advance Directive Documentation     Most Recent Value  Type of Advance Directive  Healthcare Power of Attorney  Pre-existing out of facility DNR order (yellow form or pink MOST form)  -  "MOST" Form in Place?  -      TOTAL TIME TAKING CARE OF THIS PATIENT ON DAY OF DISCHARGE: more than 35 minutes.   Saundra Shelling M.D on 10/02/2018 at 10:05 AM  Between 7am to 6pm - Pager - [phone]  After 6pm go to [email or web address] - password [email or web address] [facility]  Office  [phone]  CC: Primary care physician; Dion Body, MD  Note: This dictation was prepared with Dragon  dictation along with smaller phrase technology. Any transcriptional errors that result from this process are unintentional.

## 2018-10-02 NOTE — Care Management Important Message (Signed)
Important Message  Patient Details  Name: ROSSETTA KAMA MRN: [id number] Date of Birth: 05-19-1935   Medicare Important Message Given:  Yes    Juliann Pulse A Dawid Dupriest 10/02/2018, 10:36 AM

## 2018-10-02 NOTE — Progress Notes (Signed)
  Subjective: 3 Days Post-Op Procedure(s) (LRB): ARTHROPLASTY BIPOLAR HIP (HEMIARTHROPLASTY) LEFT (Left) Patient reports pain as mild, pain improved compared to yesterday. States that she slept well last night. Patient is well, and has had no acute complaints or problems Plan is for SNF upon discharge from the hospital. Negative for chest pain and shortness of breath Fever: No recent fevers. Gastrointestinal:Negative for nausea and vomiting  Objective: Vital signs in last 24 hours: Temp:  [98.4 F (36.9 C)-98.7 F (37.1 C)] 98.7 F (37.1 C) (02/09 2334) Pulse Rate:  [68-80] 76 (02/09 2334) Resp:  [17] 17 (02/09 2334) BP: (156-170)/(56-62) 162/60 (02/09 2334) SpO2:  [95 %-99 %] 99 % (02/09 2334)  Intake/Output from previous day:  Intake/Output Summary (Last 24 hours) at 10/02/2018 0741 Last data filed at 10/02/2018 0413 Gross per 24 hour  Intake 480 ml  Output -  Net 480 ml    Intake/Output this shift: No intake/output data recorded.  Labs: Recent Labs    09/30/18 0520 10/01/18 0338 10/02/18 0348  HGB 9.6* 9.4* 9.6*   Recent Labs    10/01/18 0338 10/02/18 0348  WBC 7.4 7.4  RBC 3.55* 3.61*  HCT 32.5* 33.1*  PLT 145* 168   Recent Labs    10/01/18 0338 10/02/18 0348  NA 141 139  K 3.8 3.6  CL 104 101  CO2 32 32  BUN 14 11  CREATININE 0.49 0.46  GLUCOSE 130* 124*  CALCIUM 8.9 8.9   No results for input(s): LABPT, INR in the last 72 hours.   EXAM General - Patient is Alert, Appropriate and Oriented Extremity - ABD soft Neurovascular intact Sensation intact distally Intact pulses distally Dorsiflexion/Plantar flexion intact Incision: dressing C/D/I No cellulitis present Compartment soft Dressing/Incision - clean, dry, no drainage Motor Function - intact, moving foot and toes well on exam.   Past Medical History:  Diagnosis Date  . Acid reflux   . Arthritis   . Asthma   . Depression   . Diabetes ([place])   . Dysrhythmia   . Heart murmur   .  HLD (hyperlipidemia)   . HTN (hypertension)   . Hypotension    when get up too quickly  . Sleep apnea   . Urinary urgency     Assessment/Plan: 3 Days Post-Op Procedure(s) (LRB): ARTHROPLASTY BIPOLAR HIP (HEMIARTHROPLASTY) LEFT (Left) Principal Problem:   Hip fracture ([facility]) Active Problems:   Dizziness  Estimated body mass index is 27.41 kg/m as calculated from the following:   Height as of this encounter: 5\' 7"  (1.702 m).   Weight as of this encounter: 79.4 kg. Advance diet Up with therapy  Labs reviewed this AM.  Ht 9.6 this AM. Temp 98.7 this AM, no fever since yesterday morning.  Continue incentive spirometer, continues to deny any SOB or urinary symptoms. Patient has not had a BM at this time, can move to FLEET enema today. Up with PT today, current plan is for discharge to SNF.  Upon discharge, continue Lovenox 40mg , one injection daily for 14 days. Staple can be removed on 10/13/18 by SNF staff.  Follow-up with [facility] in 6 weeks for repeat x-rays of the left hip.  DVT Prophylaxis - Lovenox, Foot Pumps and TED hose Weight-Bearing as tolerated to left leg  J. Cameron Proud, PA-C [facility] Orthopaedic Surgery 10/02/2018, 7:41 AM

## 2018-10-03 LAB — SURGICAL PATHOLOGY

## 2018-10-11 NOTE — Anesthesia Postprocedure Evaluation (Signed)
Anesthesia Post Note  Patient: Ocie Doyne  Procedure(s) Performed: ARTHROPLASTY BIPOLAR HIP (HEMIARTHROPLASTY) LEFT (Left Hip)  Patient location during evaluation: PACU Anesthesia Type: Spinal Level of consciousness: awake and alert Pain management: pain level controlled Vital Signs Assessment: post-procedure vital signs reviewed and stable Respiratory status: spontaneous breathing, nonlabored ventilation, respiratory function stable and patient connected to nasal cannula oxygen Cardiovascular status: blood pressure returned to baseline and stable Postop Assessment: no apparent nausea or vomiting Anesthetic complications: no     Last Vitals:  Vitals:   10/02/18 1200 10/02/18 1345  BP: (!) 177/62 (!) 180/64  Pulse:  81  Resp:  18  Temp:  36.6 C  SpO2:  94%    Last Pain:  Vitals:   10/02/18 1345  TempSrc: Oral  PainSc:                  Molli Barrows

## 2018-10-26 DIAGNOSIS — Z8781 Personal history of (healed) traumatic fracture: Secondary | ICD-10-CM | POA: Insufficient documentation

## 2019-01-01 ENCOUNTER — Other Ambulatory Visit (INDEPENDENT_AMBULATORY_CARE_PROVIDER_SITE_OTHER): Payer: Self-pay | Admitting: Vascular Surgery

## 2019-01-01 DIAGNOSIS — I6529 Occlusion and stenosis of unspecified carotid artery: Secondary | ICD-10-CM

## 2019-01-02 ENCOUNTER — Encounter (INDEPENDENT_AMBULATORY_CARE_PROVIDER_SITE_OTHER): Payer: Medicare Other

## 2019-01-02 ENCOUNTER — Ambulatory Visit (INDEPENDENT_AMBULATORY_CARE_PROVIDER_SITE_OTHER): Payer: Medicare Other | Admitting: Nurse Practitioner

## 2019-01-18 ENCOUNTER — Other Ambulatory Visit: Payer: Self-pay

## 2019-01-18 ENCOUNTER — Ambulatory Visit (INDEPENDENT_AMBULATORY_CARE_PROVIDER_SITE_OTHER): Payer: Medicare Other

## 2019-01-18 ENCOUNTER — Ambulatory Visit (INDEPENDENT_AMBULATORY_CARE_PROVIDER_SITE_OTHER): Payer: Medicare Other | Admitting: Nurse Practitioner

## 2019-01-18 ENCOUNTER — Encounter (INDEPENDENT_AMBULATORY_CARE_PROVIDER_SITE_OTHER): Payer: Self-pay | Admitting: Nurse Practitioner

## 2019-01-18 VITALS — BP 200/73 | HR 62 | Resp 10 | Ht 65.0 in | Wt 176.0 lb

## 2019-01-18 DIAGNOSIS — E119 Type 2 diabetes mellitus without complications: Secondary | ICD-10-CM | POA: Diagnosis not present

## 2019-01-18 DIAGNOSIS — K219 Gastro-esophageal reflux disease without esophagitis: Secondary | ICD-10-CM | POA: Diagnosis not present

## 2019-01-18 DIAGNOSIS — I6523 Occlusion and stenosis of bilateral carotid arteries: Secondary | ICD-10-CM | POA: Diagnosis not present

## 2019-01-18 DIAGNOSIS — Z7984 Long term (current) use of oral hypoglycemic drugs: Secondary | ICD-10-CM

## 2019-01-18 DIAGNOSIS — I10 Essential (primary) hypertension: Secondary | ICD-10-CM

## 2019-01-18 DIAGNOSIS — I6529 Occlusion and stenosis of unspecified carotid artery: Secondary | ICD-10-CM | POA: Diagnosis not present

## 2019-01-18 DIAGNOSIS — Z79899 Other long term (current) drug therapy: Secondary | ICD-10-CM

## 2019-01-18 DIAGNOSIS — Z7982 Long term (current) use of aspirin: Secondary | ICD-10-CM

## 2019-01-18 NOTE — Progress Notes (Signed)
SUBJECTIVE:  [name], female    DOB: 1935-03-22, 84 y.o.   MRN: [id number] Chief Complaint  Patient presents with  . Follow-up    HPI  Mary [name] is a 84 y.o. female with known carotid artery stenosis that presents after TIA-like symptoms at [facility] emergency department.  Subsequent CT angios of the head and neck revealed 70% stenosis of the right internal carotid artery with 40 to 50% stenosis of the left.  The patient denies amaurosis fugax. There is no recent history of TIA symptoms or focal motor deficits. There is no prior documented CVA.  There is no history of migraine headaches. There is no history of seizures.  The patient is taking enteric-coated aspirin 81 mg daily.  The patient has a history of coronary artery disease, no recent episodes of angina or shortness of breath. The patient denies PAD or claudication symptoms. There is a history of hyperlipidemia which is being treated with a statin.   Today the patient has evidence of 60 to 79% stenosis in her right internal carotid artery with 1 to 39% stenosis of her left.  The external carotid artery bilaterally appears greater than 50% stenosed. Past Medical History:  Diagnosis Date  . Acid reflux   . Arthritis   . Asthma   . Depression   . Diabetes ([place])   . Dysrhythmia   . Heart murmur   . HLD (hyperlipidemia)   . HTN (hypertension)   . Hypotension    when get up too quickly  . Sleep apnea   . Urinary urgency     Past Surgical History:  Procedure Laterality Date  . ABDOMINAL HYSTERECTOMY    . CYSTOSCOPY    . HIP ARTHROPLASTY Right 09/24/2015   Procedure: ARTHROPLASTY BIPOLAR HIP (HEMIARTHROPLASTY);  Surgeon: Corky Mull, MD;  Location: [facility] ORS;  Service: Orthopedics;  Laterality: Right;  . HIP ARTHROPLASTY Left 09/29/2018   Procedure: ARTHROPLASTY BIPOLAR HIP (HEMIARTHROPLASTY) LEFT;  Surgeon: Corky Mull, MD;  Location: [facility] ORS;  Service: Orthopedics;   Laterality: Left;  . JOINT REPLACEMENT     right hip  . JOINT REPLACEMENT     right knee  . KNEE ARTHROSCOPY W/ AUTOGENOUS CARTILAGE IMPLANTATION (ACI) PROCEDURE    . TOTAL KNEE ARTHROPLASTY Left 12/01/2017   Procedure: TOTAL KNEE ARTHROPLASTY;  Surgeon: Corky Mull, MD;  Location: [facility] ORS;  Service: Orthopedics;  Laterality: Left;  Marland Kitchen VARICOSE VEIN SURGERY    . VEIN SURGERY      Social History   Socioeconomic History  . Marital status: Widowed    Spouse name: Not on file  . Number of children: Not on file  . Years of education: Not on file  . Highest education level: Not on file  Occupational History  . Occupation: retired  Scientific laboratory technician  . Financial resource strain: Not on file  . Food insecurity:    Worry: Not on file    Inability: Not on file  . Transportation needs:    Medical: Not on file    Non-medical: Not on file  Tobacco Use  . Smoking status: Never Smoker  . Smokeless tobacco: Never Used  Substance and Sexual Activity  . Alcohol use: No    Alcohol/week: 0.0 standard drinks  . Drug use: No  . Sexual activity: Not Currently  Lifestyle  . Physical activity:    Days per week: Not on file    Minutes per session: Not on file  .  Stress: Not on file  Relationships  . Social connections:    Talks on phone: Not on file    Gets together: Not on file    Attends religious service: Not on file    Active member of club or organization: Not on file    Attends meetings of clubs or organizations: Not on file    Relationship status: Not on file  . Intimate partner violence:    Fear of current or ex partner: Not on file    Emotionally abused: Not on file    Physically abused: Not on file    Forced sexual activity: Not on file  Other Topics Concern  . Not on file  Social History Narrative  . Not on file    Family History  Problem Relation Age of Onset  . Kidney disease Brother        also nephew  . Heart disease Mother   . Heart disease Father   . Prostate  cancer Neg Hx   . Bladder Cancer Neg Hx   . Breast cancer Neg Hx   . Kidney cancer Neg Hx     Allergies  Allergen Reactions  . Cephalexin Hives  . Nitrofurantoin     Other reaction(s): Other (See Comments) Other Reaction: measles-like lesions  . Sulfa Antibiotics Hives  . Atorvastatin Other (See Comments)    Muscle spasms/ muscle pain     Review of Systems   Review of Systems: Negative Unless Checked Constitutional: [] Weight loss  [] Fever  [] Chills Cardiac: [] Chest pain   []  Atrial Fibrillation  [] Palpitations   [] Shortness of breath when laying flat   [] Shortness of breath with exertion. [] Shortness of breath at rest Vascular:  [] Pain in legs with walking   [] Pain in legs with standing [] Pain in legs when laying flat   [] Claudication    [] Pain in feet when laying flat    [] History of DVT   [] Phlebitis   [] Swelling in legs   [] Varicose veins   [] Non-healing ulcers Pulmonary:   [] Uses home oxygen   [] Productive cough   [] Hemoptysis   [] Wheeze  [] COPD   [] Asthma Neurologic:  [x] Dizziness   [] Seizures  [] Blackouts [] History of stroke   [] History of TIA  [] Aphasia   [] Temporary Blindness   [] Weakness or numbness in arm   [] Weakness or numbness in leg Musculoskeletal:   [] Joint swelling   [] Joint pain   [] Low back pain  []  History of Knee Replacement [x] Arthritis [] back Surgeries  []  Spinal Stenosis    Hematologic:  [] Easy bruising  [] Easy bleeding   [] Hypercoagulable state   [] Anemic Gastrointestinal:  [] Diarrhea   [] Vomiting  [x] Gastroesophageal reflux/heartburn   [] Difficulty swallowing. [] Abdominal pain Genitourinary:  [] Chronic kidney disease   [] Difficult urination  [] Anuric   [] Blood in urine [] Frequent urination  [] Burning with urination   [] Hematuria Skin:  [] Rashes   [] Ulcers [] Wounds Psychological:  [x] History of anxiety   []  History of major depression  []  Memory Difficulties      OBJECTIVE:   Physical Exam  BP (!) 200/73 (BP Location: Left Arm, Patient Position: Sitting,  Cuff Size: Large)   Pulse 62   Resp 10   Ht 5\' 5"  (1.651 m)   Wt 176 lb (79.8 kg)   BMI 29.29 kg/m   Gen: WD/WN, NAD Head: [place]/AT, No temporalis wasting.  Ear/Nose/Throat: Hearing grossly intact, nares w/o erythema or drainage Eyes: PER, EOMI, sclera nonicteric.  Neck: Supple, no masses.  No JVD.  Pulmonary:  Good air movement,  no use of accessory muscles.  Cardiac: RRR Vascular: left carotid bruit  Vessel Right Left  Radial Palpable Palpable   Gastrointestinal: soft, non-distended. No guarding/no peritoneal signs.  Musculoskeletal: M/S 5/5 throughout.  No deformity or atrophy.  Neurologic: Pain and light touch intact in extremities.  Symmetrical.  Speech is fluent. Motor exam as listed above. Psychiatric: Judgment intact, Mood & affect appropriate for pt's clinical situation. Dermatologic: No Venous rashes. No Ulcers Noted.  No changes consistent with cellulitis. Lymph : No Cervical lymphadenopathy, no lichenification or skin changes of chronic lymphedema.       ASSESSMENT AND PLAN:   1. Bilateral carotid artery stenosis Recommend:  Given the patient's asymptomatic subcritical stenosis no further invasive testing or surgery at this time.  Today the patient has evidence of 60 to 79% stenosis in her right internal carotid artery with 1 to 39% stenosis of her left.  The external carotid artery bilaterally appears greater than 50% stenosed.  Continue antiplatelet therapy as prescribed Continue management of CAD, HTN and Hyperlipidemia Healthy heart diet,  encouraged exercise at least 4 times per week Follow up in 6 months with duplex ultrasound and physical exam  - VAS US CAROTID; Future  2. Gastroesophageal reflux disease, esophagitis presence not specified Continue PPI as already ordered, this medication has been reviewed and there are no changes at this time.  Avoidence of caffeine and alcohol  Moderate elevation of the head of the bed   3. Type 2 diabetes mellitus  without complication, without long-term current use of insulin (HCC) Continue hypoglycemic medications as already ordered, these medications have been reviewed and there are no changes at this time.  Hgb A1C to be monitored as already arranged by primary service   4. Essential hypertension Continue antihypertensive medications as already ordered, these medications have been reviewed and there are no changes at this time. Patient BP is elevated today however she is asymptomatic, advised to check again and if it remains elevated she should contact her PCP for medication adjustment.    Current Outpatient Medications on File Prior to Visit  Medication Sig Dispense Refill  . acetaminophen (TYLENOL) 500 MG tablet Take 500 mg by mouth every 6 (six) hours as needed.    Marland Kitchen amLODipine (NORVASC) 10 MG tablet Take 10 mg by mouth daily at 2 PM.     . aspirin EC 81 MG tablet Take 81 mg by mouth daily.    . busPIRone (BUSPAR) 10 MG tablet Take 20 mg by mouth 2 (two) times daily.     . carvedilol (COREG) 3.125 MG tablet Take 3.125 mg by mouth 2 (two) times daily with a meal.    . furosemide (LASIX) 20 MG tablet Take 20 mg by mouth daily as needed for edema.    . hydrALAZINE (APRESOLINE) 25 MG tablet Take 1 tablet (25 mg total) by mouth 4 (four) times daily for 30 days. 120 tablet 0  . levothyroxine (SYNTHROID, LEVOTHROID) 75 MCG tablet Take 75 mcg by mouth daily.     Marland Kitchen losartan (COZAAR) 100 MG tablet Take 100 mg by mouth daily.     Marland Kitchen lovastatin (MEVACOR) 20 MG tablet Take 20 mg by mouth 3 (three) times a week.    . metFORMIN (GLUCOPHAGE-XR) 500 MG 24 hr tablet Take 500 mg by mouth daily.     Marland Kitchen omeprazole (PRILOSEC) 40 MG capsule Take 40 mg by mouth daily.     Marland Kitchen oxyCODONE (OXY IR/ROXICODONE) 5 MG immediate release tablet Take 1-2 tablets (  5-10 mg total) by mouth every 4 (four) hours as needed for severe pain or breakthrough pain (pain score 4-6). 20 tablet 0  . PARoxetine (PAXIL) 40 MG tablet Take 40 mg by  mouth every evening.     . solifenacin (VESICARE) 10 MG tablet Take 1 tablet (10 mg total) by mouth daily. 90 tablet 4  . traMADol (ULTRAM) 50 MG tablet Take 1 tablet (50 mg total) by mouth every 6 (six) hours as needed for moderate pain. 20 tablet 0  . vitamin B-12 (CYANOCOBALAMIN) 500 MCG tablet Take 500 mcg by mouth daily.    . vitamin E 400 UNIT capsule Take 400 Units by mouth 4 (four) times a week.     . enoxaparin (LOVENOX) 40 MG/0.4ML injection Inject 0.4 mLs (40 mg total) into the skin daily for 14 days. 5.6 mL 0  . Hypromellose (ARTIFICIAL TEARS OP) Place 1 drop into both eyes daily as needed (dry eyes).    . polyethylene glycol (MIRALAX / GLYCOLAX) packet Take 17 g by mouth daily. (Patient not taking: Reported on 01/18/2019) 14 each 0   No current facility-administered medications on file prior to visit.     There are no Patient Instructions on file for this visit. Return in about 6 months (around 07/21/2019) for Carotid stenosis.   Kris Hartmann, NP  This note was completed with Sales executive.  Any errors are purely unintentional.

## 2019-02-12 ENCOUNTER — Ambulatory Visit: Payer: Medicare Other | Admitting: Urology

## 2019-02-18 DIAGNOSIS — N390 Urinary tract infection, site not specified: Secondary | ICD-10-CM | POA: Insufficient documentation

## 2019-02-18 NOTE — Progress Notes (Signed)
02/19/2019 1:14 PM   Ocie Doyne 11-Nov-1934 [id number]  Referring provider: Dion Body, MD [facility] [place],   [id number]  Chief Complaint  Patient presents with  . Recurrent UTI    HPI: Mrs. Dixson is an 84 year old female who is referred by Dr. Netty Starring for persistent UTI symptoms.  Her symptoms at this time consist of frequency (several times during the day), burning with urination (?urgency) and nocturia (all night long).  She has had these symptoms for several months.  She denies gross hematuria, suprapubic pain, back pain, abdominal pain or flank pain.  She has not had any recent fevers, chills, nausea or vomiting.  She has not noted anything that worsens her symptoms or lessens her symptoms.    She does not have a recent history of nephrolithiasis, GU surgery or GU trauma.  She is not sexually active.  She is postmenopausal.   She denies constipation and/or diarrhea.   She has incontinence.  She is using incontinence pads.   She is drinking 4 to 6 bottles of water daily.  She drinks one to one and one half cup of coffee in the morning.  She does drink Pepsi/Dr. Pepper three days a week.  She does not drink tea or alcohol.  She is drinking orange juice and cranberry juice.    Her UA and urine culture were negative.  Her PVR is 2 mL.    She is no longer taking the Vesicare and she has not taken it in several months.    PMH: Past Medical History:  Diagnosis Date  . Acid reflux   . Arthritis   . Asthma   . Depression   . Diabetes ([place])   . Dysrhythmia   . Heart murmur   . HLD (hyperlipidemia)   . HTN (hypertension)   . Hypotension    when get up too quickly  . Sleep apnea   . Urinary urgency     Surgical History: Past Surgical History:  Procedure Laterality Date  . ABDOMINAL HYSTERECTOMY    . CYSTOSCOPY    . HIP ARTHROPLASTY Right 09/24/2015   Procedure: ARTHROPLASTY BIPOLAR HIP (HEMIARTHROPLASTY);  Surgeon:  Corky Mull, MD;  Location: [facility] ORS;  Service: Orthopedics;  Laterality: Right;  . HIP ARTHROPLASTY Left 09/29/2018   Procedure: ARTHROPLASTY BIPOLAR HIP (HEMIARTHROPLASTY) LEFT;  Surgeon: Corky Mull, MD;  Location: [facility] ORS;  Service: Orthopedics;  Laterality: Left;  . JOINT REPLACEMENT     right hip  . JOINT REPLACEMENT     right knee  . KNEE ARTHROSCOPY W/ AUTOGENOUS CARTILAGE IMPLANTATION (ACI) PROCEDURE    . TOTAL KNEE ARTHROPLASTY Left 12/01/2017   Procedure: TOTAL KNEE ARTHROPLASTY;  Surgeon: Corky Mull, MD;  Location: [facility] ORS;  Service: Orthopedics;  Laterality: Left;  Marland Kitchen VARICOSE VEIN SURGERY    . VEIN SURGERY      Home Medications:  Allergies as of 02/19/2019      Reactions   Cephalexin Hives   Nitrofurantoin    Other reaction(s): Other (See Comments) Other Reaction: measles-like lesions   Sulfa Antibiotics Hives   Atorvastatin Other (See Comments)   Muscle spasms/ muscle pain      Medication List       Accurate as of February 19, 2019 11:59 PM. If you have any questions, ask your nurse or doctor.        acetaminophen 500 MG tablet Commonly known as: TYLENOL Take 500 mg by mouth  every 6 (six) hours as needed.   amLODipine 10 MG tablet Commonly known as: NORVASC Take 10 mg by mouth daily at 2 PM.   ARTIFICIAL TEARS OP Place 1 drop into both eyes daily as needed (dry eyes).   aspirin EC 81 MG tablet Take 81 mg by mouth daily.   busPIRone 10 MG tablet Commonly known as: BUSPAR Take 20 mg by mouth 2 (two) times daily.   carvedilol 3.125 MG tablet Commonly known as: COREG Take 3.125 mg by mouth 2 (two) times daily with a meal.   enoxaparin 40 MG/0.4ML injection Commonly known as: LOVENOX Inject 0.4 mLs (40 mg total) into the skin daily for 14 days.   furosemide 20 MG tablet Commonly known as: LASIX Take 20 mg by mouth daily as needed for edema.   hydrALAZINE 25 MG tablet Commonly known as: APRESOLINE Take 1 tablet (25 mg total) by mouth 4 (four)  times daily for 30 days.   levothyroxine 75 MCG tablet Commonly known as: SYNTHROID Take 75 mcg by mouth daily.   losartan 100 MG tablet Commonly known as: COZAAR Take 100 mg by mouth daily.   lovastatin 20 MG tablet Commonly known as: MEVACOR Take 20 mg by mouth 3 (three) times a week.   metFORMIN 500 MG 24 hr tablet Commonly known as: GLUCOPHAGE-XR Take 500 mg by mouth daily.   omeprazole 40 MG capsule Commonly known as: PRILOSEC Take 40 mg by mouth daily.   oxyCODONE 5 MG immediate release tablet Commonly known as: Oxy IR/ROXICODONE Take 1-2 tablets (5-10 mg total) by mouth every 4 (four) hours as needed for severe pain or breakthrough pain (pain score 4-6).   PARoxetine 40 MG tablet Commonly known as: PAXIL Take 40 mg by mouth every evening.   polyethylene glycol 17 g packet Commonly known as: MIRALAX / GLYCOLAX Take 17 g by mouth daily.   solifenacin 10 MG tablet Commonly known as: VESICARE Take 1 tablet (10 mg total) by mouth daily.   traMADol 50 MG tablet Commonly known as: ULTRAM Take 1 tablet (50 mg total) by mouth every 6 (six) hours as needed for moderate pain.   vitamin B-12 500 MCG tablet Commonly known as: CYANOCOBALAMIN Take 500 mcg by mouth daily.   vitamin E 400 UNIT capsule Take 400 Units by mouth 4 (four) times a week.       Allergies:  Allergies  Allergen Reactions  . Cephalexin Hives  . Nitrofurantoin     Other reaction(s): Other (See Comments) Other Reaction: measles-like lesions  . Sulfa Antibiotics Hives  . Atorvastatin Other (See Comments)    Muscle spasms/ muscle pain    Family History: Family History  Problem Relation Age of Onset  . Kidney disease Brother        also nephew  . Heart disease Mother   . Heart disease Father   . Prostate cancer Neg Hx   . Bladder Cancer Neg Hx   . Breast cancer Neg Hx   . Kidney cancer Neg Hx     Social History:  reports that she has never smoked. She has never used smokeless  tobacco. She reports that she does not drink alcohol or use drugs.  ROS: UROLOGY Frequent Urination?: Yes Hard to postpone urination?: No Burning/pain with urination?: Yes Get up at night to urinate?: Yes Leakage of urine?: No Urine stream starts and stops?: No Trouble starting stream?: No Do you have to strain to urinate?: No Blood in urine?: No Urinary tract infection?:  Yes Sexually transmitted disease?: No Injury to kidneys or bladder?: No Painful intercourse?: No Weak stream?: No Currently pregnant?: No Vaginal bleeding?: No Last menstrual period?: n  Gastrointestinal Nausea?: No Vomiting?: No Indigestion/heartburn?: No Diarrhea?: No Constipation?: No  Constitutional Fever: No Night sweats?: No Weight loss?: No Fatigue?: No  Skin Skin rash/lesions?: No Itching?: No  Eyes Blurred vision?: No Double vision?: No  Ears/Nose/Throat Sore throat?: No Sinus problems?: No  Hematologic/Lymphatic Swollen glands?: No Easy bruising?: No  Cardiovascular Leg swelling?: No Chest pain?: No  Respiratory Cough?: No Shortness of breath?: No  Endocrine Excessive thirst?: No  Musculoskeletal Back pain?: No Joint pain?: No  Neurological Headaches?: No Dizziness?: No  Psychologic Depression?: No Anxiety?: No  Physical Exam: BP 134/68   Pulse 72   Ht 5\' 5"  (1.651 m)   Wt 189 lb (85.7 kg)   BMI 31.45 kg/m   Constitutional:  Well nourished. Alert and oriented, No acute distress. HEENT: [place] AT, moist mucus membranes.  Trachea midline, no masses. Cardiovascular: No clubbing, cyanosis, or edema. Respiratory: Normal respiratory effort, no increased work of breathing. GI: Abdomen is soft, non tender, non distended, no abdominal masses. Liver and spleen not palpable.  No hernias appreciated.  Stool sample for occult testing is not indicated.   GU: No CVA tenderness.  No bladder fullness or masses.  Atrophic external genitalia, sparse pubic hair distribution, no  lesions.  Normal urethral meatus, no lesions, no prolapse, no discharge.   No urethral masses, tenderness and/or tenderness. No bladder fullness, tenderness or masses. Pale vagina mucosa, poor estrogen effect, no discharge, no lesions, fair pelvic support, no cystocele and grade II rectocele noted.  Cervix, uterus and adnexa are surgically absent.  Anus and perineum are without rashes or lesions.    Skin: No rashes, bruises or suspicious lesions. Lymph: No inguinal adenopathy. Neurologic: Grossly intact, no focal deficits, moving all 4 extremities. Psychiatric: Normal mood and affect.   Laboratory Data: Lab Results  Component Value Date   WBC 7.4 10/02/2018   HGB 9.6 (L) 10/02/2018   HCT 33.1 (L) 10/02/2018   MCV 91.7 10/02/2018   PLT 168 10/02/2018    Lab Results  Component Value Date   CREATININE 0.46 10/02/2018    No results found for: PSA  No results found for: TESTOSTERONE  Lab Results  Component Value Date   HGBA1C 7.1 (H) 09/29/2018    No results found for: TSH     Component Value Date/Time   CHOL 159 12/30/2013 0554   HDL 42 12/30/2013 0554   VLDL 27 12/30/2013 0554   LDLCALC 90 12/30/2013 0554    Lab Results  Component Value Date   AST 22 05/19/2017   Lab Results  Component Value Date   ALT 9 (L) 05/19/2017   No components found for: ALKALINEPHOPHATASE No components found for: BILIRUBINTOTAL  No results found for: ESTRADIOL  Urinalysis Negative.  See Epic. I have reviewed the labs.   Pertinent Imaging: Results for ANARIA, KRONER (MRN [id number]) as of 02/21/2019 13:07  Ref. Range 02/19/2019 14:39  Scan Result Unknown 27ml     Assessment & Plan:    1. Frequency Restart Vesicare 10 mg daily - reminded of the side effects RTC in 6 weeks for OAB questionnaire and PVR  2. Nocturia See above  3. Vaginal atrophy No complaints of vaginal burning at this time Will reassess when returns   Return in about 6 weeks (around 04/02/2019) for PVR and  OAB questionnaire.  These  notes generated with voice recognition software. I apologize for typographical errors.  Zara Council, PA-C  [facility] [address]  [place], [place] [address] [phone]

## 2019-02-19 ENCOUNTER — Ambulatory Visit: Payer: Medicare Other | Admitting: Urology

## 2019-02-19 ENCOUNTER — Encounter: Payer: Self-pay | Admitting: Urology

## 2019-02-19 ENCOUNTER — Other Ambulatory Visit: Payer: Self-pay

## 2019-02-19 VITALS — BP 134/68 | HR 72 | Ht 65.0 in | Wt 189.0 lb

## 2019-02-19 DIAGNOSIS — R351 Nocturia: Secondary | ICD-10-CM | POA: Diagnosis not present

## 2019-02-19 DIAGNOSIS — N952 Postmenopausal atrophic vaginitis: Secondary | ICD-10-CM | POA: Diagnosis not present

## 2019-02-19 DIAGNOSIS — R350 Frequency of micturition: Secondary | ICD-10-CM

## 2019-02-19 DIAGNOSIS — N3000 Acute cystitis without hematuria: Secondary | ICD-10-CM

## 2019-02-19 LAB — URINALYSIS, COMPLETE
Bilirubin, UA: NEGATIVE
Glucose, UA: NEGATIVE
Ketones, UA: NEGATIVE
Leukocytes,UA: NEGATIVE
Nitrite, UA: NEGATIVE
Protein,UA: NEGATIVE
RBC, UA: NEGATIVE
Specific Gravity, UA: 1.015 (ref 1.005–1.030)
Urobilinogen, Ur: 0.2 mg/dL (ref 0.2–1.0)
pH, UA: 6.5 (ref 5.0–7.5)

## 2019-02-19 LAB — BLADDER SCAN AMB NON-IMAGING

## 2019-02-19 LAB — MICROSCOPIC EXAMINATION
Bacteria, UA: NONE SEEN
RBC, Urine: NONE SEEN /hpf (ref 0–2)

## 2019-02-19 MED ORDER — SOLIFENACIN SUCCINATE 10 MG PO TABS: 10.0000 mg | ORAL_TABLET | Freq: Every day | ORAL | 3 refills | Status: DC

## 2019-02-21 ENCOUNTER — Telehealth: Payer: Self-pay

## 2019-02-21 LAB — URINE CULTURE: Organism ID, Bacteria: NO GROWTH

## 2019-02-21 NOTE — Telephone Encounter (Signed)
-----   Message from Nori Riis, PA-C sent at 02/21/2019  7:27 AM EDT ----- Please let Mrs. Norfolk know that her urine culture was negative.

## 2019-02-21 NOTE — Telephone Encounter (Signed)
Patient notified

## 2019-04-05 ENCOUNTER — Ambulatory Visit: Payer: Medicare Other | Admitting: Urology

## 2019-05-23 DIAGNOSIS — H348312 Tributary (branch) retinal vein occlusion, right eye, stable: Secondary | ICD-10-CM | POA: Insufficient documentation

## 2019-05-23 DIAGNOSIS — Z8639 Personal history of other endocrine, nutritional and metabolic disease: Secondary | ICD-10-CM | POA: Insufficient documentation

## 2019-05-31 DIAGNOSIS — E11319 Type 2 diabetes mellitus with unspecified diabetic retinopathy without macular edema: Secondary | ICD-10-CM | POA: Insufficient documentation

## 2019-06-08 DIAGNOSIS — H35033 Hypertensive retinopathy, bilateral: Secondary | ICD-10-CM | POA: Insufficient documentation

## 2019-06-08 DIAGNOSIS — E113299 Type 2 diabetes mellitus with mild nonproliferative diabetic retinopathy without macular edema, unspecified eye: Secondary | ICD-10-CM | POA: Insufficient documentation

## 2019-07-13 ENCOUNTER — Other Ambulatory Visit (INDEPENDENT_AMBULATORY_CARE_PROVIDER_SITE_OTHER): Payer: Self-pay | Admitting: Nurse Practitioner

## 2019-07-13 DIAGNOSIS — I6523 Occlusion and stenosis of bilateral carotid arteries: Secondary | ICD-10-CM

## 2019-07-23 ENCOUNTER — Encounter (INDEPENDENT_AMBULATORY_CARE_PROVIDER_SITE_OTHER): Payer: Medicare Other

## 2019-07-23 ENCOUNTER — Ambulatory Visit (INDEPENDENT_AMBULATORY_CARE_PROVIDER_SITE_OTHER): Payer: Medicare Other | Admitting: Vascular Surgery

## 2019-11-16 ENCOUNTER — Emergency Department: Payer: Medicare PPO

## 2019-11-16 ENCOUNTER — Observation Stay: Payer: Medicare PPO

## 2019-11-16 ENCOUNTER — Other Ambulatory Visit: Payer: Self-pay

## 2019-11-16 ENCOUNTER — Observation Stay
Admission: EM | Admit: 2019-11-16 | Discharge: 2019-11-18 | Disposition: A | Discharge status: Home / Self Care | Payer: Medicare PPO | Attending: Internal Medicine | Admitting: Internal Medicine

## 2019-11-16 DIAGNOSIS — I700 Atherosclerosis of aorta: Secondary | ICD-10-CM | POA: Insufficient documentation

## 2019-11-16 DIAGNOSIS — Z66 Do not resuscitate: Secondary | ICD-10-CM | POA: Diagnosis not present

## 2019-11-16 DIAGNOSIS — E785 Hyperlipidemia, unspecified: Secondary | ICD-10-CM | POA: Diagnosis not present

## 2019-11-16 DIAGNOSIS — E1142 Type 2 diabetes mellitus with diabetic polyneuropathy: Secondary | ICD-10-CM | POA: Diagnosis not present

## 2019-11-16 DIAGNOSIS — I6782 Cerebral ischemia: Secondary | ICD-10-CM | POA: Diagnosis not present

## 2019-11-16 DIAGNOSIS — I358 Other nonrheumatic aortic valve disorders: Secondary | ICD-10-CM | POA: Insufficient documentation

## 2019-11-16 DIAGNOSIS — K449 Diaphragmatic hernia without obstruction or gangrene: Secondary | ICD-10-CM | POA: Diagnosis not present

## 2019-11-16 DIAGNOSIS — F32A Depression, unspecified: Secondary | ICD-10-CM | POA: Diagnosis present

## 2019-11-16 DIAGNOSIS — M1990 Unspecified osteoarthritis, unspecified site: Secondary | ICD-10-CM | POA: Insufficient documentation

## 2019-11-16 DIAGNOSIS — Z888 Allergy status to other drugs, medicaments and biological substances status: Secondary | ICD-10-CM | POA: Insufficient documentation

## 2019-11-16 DIAGNOSIS — I420 Dilated cardiomyopathy: Secondary | ICD-10-CM | POA: Insufficient documentation

## 2019-11-16 DIAGNOSIS — Z7982 Long term (current) use of aspirin: Secondary | ICD-10-CM | POA: Diagnosis not present

## 2019-11-16 DIAGNOSIS — J4520 Mild intermittent asthma, uncomplicated: Secondary | ICD-10-CM | POA: Diagnosis not present

## 2019-11-16 DIAGNOSIS — R42 Dizziness and giddiness: Secondary | ICD-10-CM | POA: Diagnosis not present

## 2019-11-16 DIAGNOSIS — Z79899 Other long term (current) drug therapy: Secondary | ICD-10-CM | POA: Diagnosis not present

## 2019-11-16 DIAGNOSIS — J9601 Acute respiratory failure with hypoxia: Principal | ICD-10-CM | POA: Insufficient documentation

## 2019-11-16 DIAGNOSIS — Z7901 Long term (current) use of anticoagulants: Secondary | ICD-10-CM | POA: Diagnosis not present

## 2019-11-16 DIAGNOSIS — K219 Gastro-esophageal reflux disease without esophagitis: Secondary | ICD-10-CM | POA: Diagnosis not present

## 2019-11-16 DIAGNOSIS — R0902 Hypoxemia: Secondary | ICD-10-CM

## 2019-11-16 DIAGNOSIS — Z96653 Presence of artificial knee joint, bilateral: Secondary | ICD-10-CM | POA: Insufficient documentation

## 2019-11-16 DIAGNOSIS — J45909 Unspecified asthma, uncomplicated: Secondary | ICD-10-CM | POA: Diagnosis not present

## 2019-11-16 DIAGNOSIS — I10 Essential (primary) hypertension: Secondary | ICD-10-CM | POA: Diagnosis present

## 2019-11-16 DIAGNOSIS — Z7984 Long term (current) use of oral hypoglycemic drugs: Secondary | ICD-10-CM | POA: Insufficient documentation

## 2019-11-16 DIAGNOSIS — F419 Anxiety disorder, unspecified: Secondary | ICD-10-CM | POA: Diagnosis not present

## 2019-11-16 DIAGNOSIS — Z20822 Contact with and (suspected) exposure to covid-19: Secondary | ICD-10-CM | POA: Insufficient documentation

## 2019-11-16 DIAGNOSIS — Z882 Allergy status to sulfonamides status: Secondary | ICD-10-CM | POA: Insufficient documentation

## 2019-11-16 DIAGNOSIS — Z881 Allergy status to other antibiotic agents status: Secondary | ICD-10-CM | POA: Insufficient documentation

## 2019-11-16 DIAGNOSIS — G4730 Sleep apnea, unspecified: Secondary | ICD-10-CM | POA: Insufficient documentation

## 2019-11-16 DIAGNOSIS — R2681 Unsteadiness on feet: Secondary | ICD-10-CM | POA: Diagnosis not present

## 2019-11-16 DIAGNOSIS — E119 Type 2 diabetes mellitus without complications: Secondary | ICD-10-CM | POA: Diagnosis not present

## 2019-11-16 DIAGNOSIS — I160 Hypertensive urgency: Secondary | ICD-10-CM | POA: Diagnosis present

## 2019-11-16 DIAGNOSIS — I119 Hypertensive heart disease without heart failure: Secondary | ICD-10-CM | POA: Diagnosis not present

## 2019-11-16 DIAGNOSIS — F329 Major depressive disorder, single episode, unspecified: Secondary | ICD-10-CM | POA: Diagnosis present

## 2019-11-16 DIAGNOSIS — I272 Pulmonary hypertension, unspecified: Secondary | ICD-10-CM | POA: Diagnosis not present

## 2019-11-16 DIAGNOSIS — Z79890 Hormone replacement therapy (postmenopausal): Secondary | ICD-10-CM | POA: Insufficient documentation

## 2019-11-16 DIAGNOSIS — E039 Hypothyroidism, unspecified: Secondary | ICD-10-CM | POA: Diagnosis not present

## 2019-11-16 DIAGNOSIS — R531 Weakness: Secondary | ICD-10-CM

## 2019-11-16 DIAGNOSIS — R079 Chest pain, unspecified: Secondary | ICD-10-CM

## 2019-11-16 DIAGNOSIS — Z8249 Family history of ischemic heart disease and other diseases of the circulatory system: Secondary | ICD-10-CM | POA: Insufficient documentation

## 2019-11-16 DIAGNOSIS — Z96643 Presence of artificial hip joint, bilateral: Secondary | ICD-10-CM | POA: Insufficient documentation

## 2019-11-16 LAB — CBC WITH DIFFERENTIAL/PLATELET
Abs Immature Granulocytes: 0.01 10*3/uL (ref 0.00–0.07)
Basophils Absolute: 0.1 10*3/uL (ref 0.0–0.1)
Basophils Relative: 1 %
Eosinophils Absolute: 0.2 10*3/uL (ref 0.0–0.5)
Eosinophils Relative: 3 %
HCT: 36.4 % (ref 36.0–46.0)
Hemoglobin: 10.6 g/dL — ABNORMAL LOW (ref 12.0–15.0)
Immature Granulocytes: 0 %
Lymphocytes Relative: 21 %
Lymphs Abs: 1.1 10*3/uL (ref 0.7–4.0)
MCH: 25.5 pg — ABNORMAL LOW (ref 26.0–34.0)
MCHC: 29.1 g/dL — ABNORMAL LOW (ref 30.0–36.0)
MCV: 87.7 fL (ref 80.0–100.0)
Monocytes Absolute: 0.4 10*3/uL (ref 0.1–1.0)
Monocytes Relative: 8 %
Neutro Abs: 3.4 10*3/uL (ref 1.7–7.7)
Neutrophils Relative %: 67 %
Platelets: 202 10*3/uL (ref 150–400)
RBC: 4.15 MIL/uL (ref 3.87–5.11)
RDW: 16.2 % — ABNORMAL HIGH (ref 11.5–15.5)
WBC: 5.1 10*3/uL (ref 4.0–10.5)
nRBC: 0 % (ref 0.0–0.2)

## 2019-11-16 LAB — BASIC METABOLIC PANEL
Anion gap: 8 (ref 5–15)
BUN: 11 mg/dL (ref 8–23)
CO2: 31 mmol/L (ref 22–32)
Calcium: 9.2 mg/dL (ref 8.9–10.3)
Chloride: 102 mmol/L (ref 98–111)
Creatinine, Ser: 0.61 mg/dL (ref 0.44–1.00)
GFR calc Af Amer: >60 mL/min (ref >60)
GFR calc non Af Amer: >60 mL/min (ref >60)
Glucose, Bld: 150 mg/dL — ABNORMAL HIGH (ref 70–99)
Potassium: 3.6 mmol/L (ref 3.5–5.1)
Sodium: 141 mmol/L (ref 135–145)

## 2019-11-16 LAB — RESPIRATORY PANEL BY RT PCR (FLU A&B, COVID)
Influenza A by PCR: NEGATIVE
Influenza B by PCR: NEGATIVE
SARS Coronavirus 2 by RT PCR: NEGATIVE

## 2019-11-16 LAB — POC SARS CORONAVIRUS 2 AG: SARS Coronavirus 2 Ag: NEGATIVE

## 2019-11-16 LAB — BRAIN NATRIURETIC PEPTIDE: B Natriuretic Peptide: 85 pg/mL (ref 0.0–100.0)

## 2019-11-16 LAB — TROPONIN I (HIGH SENSITIVITY)
Troponin I (High Sensitivity): 9 ng/L (ref <18)
Troponin I (High Sensitivity): 11 ng/L (ref <18)

## 2019-11-16 LAB — GLUCOSE, CAPILLARY: Glucose-Capillary: 130 mg/dL — ABNORMAL HIGH (ref 70–99)

## 2019-11-16 MED ORDER — DM-GUAIFENESIN ER 30-600 MG PO TB12
1.0000 | ORAL_TABLET | Freq: Two times a day (BID) | ORAL | Status: DC
  Administered 2019-11-16 – 2019-11-18 (×4): 1 via ORAL
  Filled 2019-11-16 (×6): qty 1

## 2019-11-16 MED ORDER — IPRATROPIUM BROMIDE HFA 17 MCG/ACT IN AERS
2.0000 | INHALATION_SPRAY | Freq: Four times a day (QID) | RESPIRATORY_TRACT | Status: DC
  Administered 2019-11-16 – 2019-11-18 (×9): 2 via RESPIRATORY_TRACT
  Filled 2019-11-16: qty 12.9

## 2019-11-16 MED ORDER — TRAMADOL HCL 50 MG PO TABS
50.0000 mg | ORAL_TABLET | Freq: Four times a day (QID) | ORAL | Status: DC | PRN
  Administered 2019-11-16: 50 mg via ORAL
  Filled 2019-11-16: qty 1

## 2019-11-16 MED ORDER — BUSPIRONE HCL 10 MG PO TABS
20.0000 mg | ORAL_TABLET | Freq: Two times a day (BID) | ORAL | Status: DC
  Administered 2019-11-16 – 2019-11-18 (×4): 20 mg via ORAL
  Filled 2019-11-16 (×5): qty 2

## 2019-11-16 MED ORDER — HYDRALAZINE HCL 20 MG/ML IJ SOLN
5.0000 mg | INTRAMUSCULAR | Status: DC | PRN
  Administered 2019-11-16 – 2019-11-17 (×4): 5 mg via INTRAVENOUS
  Filled 2019-11-16 (×5): qty 1

## 2019-11-16 MED ORDER — LOSARTAN POTASSIUM 50 MG PO TABS
100.0000 mg | ORAL_TABLET | Freq: Every day | ORAL | Status: DC
  Administered 2019-11-16 – 2019-11-18 (×3): 100 mg via ORAL
  Filled 2019-11-16 (×3): qty 2

## 2019-11-16 MED ORDER — ENOXAPARIN SODIUM 40 MG/0.4ML ~~LOC~~ SOLN
40.0000 mg | SUBCUTANEOUS | Status: DC
  Administered 2019-11-16 – 2019-11-17 (×2): 40 mg via SUBCUTANEOUS
  Filled 2019-11-16 (×2): qty 0.4

## 2019-11-16 MED ORDER — HYDRALAZINE HCL 50 MG PO TABS
50.0000 mg | ORAL_TABLET | Freq: Three times a day (TID) | ORAL | Status: DC
  Administered 2019-11-16 – 2019-11-18 (×5): 50 mg via ORAL
  Filled 2019-11-16 (×5): qty 1

## 2019-11-16 MED ORDER — ONDANSETRON HCL 4 MG/2ML IJ SOLN: 4.0000 mg | Freq: Three times a day (TID) | INTRAMUSCULAR | Status: DC | PRN

## 2019-11-16 MED ORDER — CYANOCOBALAMIN 500 MCG PO TABS
500.0000 ug | ORAL_TABLET | Freq: Every day | ORAL | Status: DC
  Administered 2019-11-17 – 2019-11-18 (×2): 500 ug via ORAL
  Filled 2019-11-16 (×2): qty 1

## 2019-11-16 MED ORDER — ACETAMINOPHEN 325 MG PO TABS
650.0000 mg | ORAL_TABLET | Freq: Four times a day (QID) | ORAL | Status: DC | PRN
  Administered 2019-11-16: 650 mg via ORAL
  Filled 2019-11-16: qty 2

## 2019-11-16 MED ORDER — ORAL CARE MOUTH RINSE
15.0000 mL | Freq: Two times a day (BID) | OROMUCOSAL | Status: DC
  Administered 2019-11-17 – 2019-11-18 (×2): 15 mL via OROMUCOSAL

## 2019-11-16 MED ORDER — IOHEXOL 350 MG/ML SOLN
75.0000 mL | Freq: Once | INTRAVENOUS | Status: AC | PRN
  Administered 2019-11-16: 75 mL via INTRAVENOUS

## 2019-11-16 MED ORDER — INSULIN ASPART 100 UNIT/ML ~~LOC~~ SOLN
0.0000 [IU] | Freq: Three times a day (TID) | SUBCUTANEOUS | Status: DC
  Administered 2019-11-17 – 2019-11-18 (×2): 1 [IU] via SUBCUTANEOUS
  Filled 2019-11-16 (×2): qty 1

## 2019-11-16 MED ORDER — ALBUTEROL SULFATE HFA 108 (90 BASE) MCG/ACT IN AERS
2.0000 | INHALATION_SPRAY | RESPIRATORY_TRACT | Status: DC | PRN
  Filled 2019-11-16: qty 6.7

## 2019-11-16 MED ORDER — LEVOTHYROXINE SODIUM 75 MCG PO TABS
75.0000 ug | ORAL_TABLET | Freq: Every day | ORAL | Status: DC
  Administered 2019-11-17 – 2019-11-18 (×2): 75 ug via ORAL
  Filled 2019-11-16 (×4): qty 1

## 2019-11-16 MED ORDER — EZETIMIBE 10 MG PO TABS
10.0000 mg | ORAL_TABLET | Freq: Every day | ORAL | Status: DC
  Administered 2019-11-17 – 2019-11-18 (×2): 10 mg via ORAL
  Filled 2019-11-16 (×2): qty 1

## 2019-11-16 MED ORDER — ASPIRIN EC 81 MG PO TBEC
81.0000 mg | DELAYED_RELEASE_TABLET | Freq: Every day | ORAL | Status: DC
  Administered 2019-11-16 – 2019-11-18 (×3): 81 mg via ORAL
  Filled 2019-11-16 (×3): qty 1

## 2019-11-16 MED ORDER — VITAMIN E 180 MG (400 UNIT) PO CAPS
400.0000 [IU] | ORAL_CAPSULE | ORAL | Status: DC
  Administered 2019-11-17 – 2019-11-18 (×2): 400 [IU] via ORAL
  Filled 2019-11-16 (×2): qty 1

## 2019-11-16 MED ORDER — MECLIZINE HCL 12.5 MG PO TABS
12.5000 mg | ORAL_TABLET | Freq: Three times a day (TID) | ORAL | Status: DC | PRN
  Administered 2019-11-16 – 2019-11-17 (×2): 12.5 mg via ORAL
  Filled 2019-11-16 (×4): qty 1

## 2019-11-16 MED ORDER — PANTOPRAZOLE SODIUM 40 MG PO TBEC
40.0000 mg | DELAYED_RELEASE_TABLET | Freq: Every day | ORAL | Status: DC
  Administered 2019-11-16 – 2019-11-18 (×3): 40 mg via ORAL
  Filled 2019-11-16 (×3): qty 1

## 2019-11-16 MED ORDER — PAROXETINE HCL 20 MG PO TABS
40.0000 mg | ORAL_TABLET | Freq: Every evening | ORAL | Status: DC
  Administered 2019-11-16 – 2019-11-17 (×2): 40 mg via ORAL
  Filled 2019-11-16 (×3): qty 2

## 2019-11-16 MED ORDER — AMLODIPINE BESYLATE 10 MG PO TABS
10.0000 mg | ORAL_TABLET | Freq: Every day | ORAL | Status: DC
  Administered 2019-11-16 – 2019-11-18 (×3): 10 mg via ORAL
  Filled 2019-11-16 (×3): qty 1

## 2019-11-16 MED ORDER — HYDRALAZINE HCL 20 MG/ML IJ SOLN: 5.0000 mg | INTRAMUSCULAR | Status: DC | PRN

## 2019-11-16 MED ORDER — INSULIN ASPART 100 UNIT/ML ~~LOC~~ SOLN
0.0000 [IU] | Freq: Every day | SUBCUTANEOUS | Status: DC
  Administered 2019-11-16: 1 [IU] via SUBCUTANEOUS
  Filled 2019-11-16: qty 1

## 2019-11-16 MED ORDER — HYDROCHLOROTHIAZIDE 25 MG PO TABS
12.5000 mg | ORAL_TABLET | Freq: Every day | ORAL | Status: DC
  Administered 2019-11-17: 12.5 mg via ORAL
  Filled 2019-11-16: qty 1

## 2019-11-16 NOTE — ED Notes (Signed)
Pt O2 sat was 75% upon arrival. Rush Landmark, RN placed pt on 2L O2, pt sat increased to 99% pt reports feeling less dizzy.  EDP aware.

## 2019-11-16 NOTE — ED Provider Notes (Signed)
[facility] Emergency Department Provider Note   ____________________________________________   First MD Initiated Contact with Patient 11/16/19 [phone]     (approximate)  I have reviewed the triage vital signs and the nursing notes.   HISTORY  Chief Complaint Dizziness and Weakness    HPI Mary [name] is a 85 y.o. female with possible history of hypertension, hyperlipidemia, diabetes who presents to the ED complaining of generalized weakness and malaise.  Patient reports that she has not been feeling well for about the past 2 days.  She has not had any fevers, but describes feeling generally weak and nauseous at times, also felt dizzy when she went to get up earlier this morning.  She denies any pain and has not had any vomiting or diarrhea.  She also denies any cough, shortness of breath, or sick contacts.  She did receive her second dose of Covid vaccine 3 days ago, but states she had some milder symptoms prior to receiving the vaccine.        Past Medical History:  Diagnosis Date  . Acid reflux   . Arthritis   . Asthma   . Depression   . Diabetes ([place])   . Dysrhythmia   . Heart murmur   . HLD (hyperlipidemia)   . HTN (hypertension)   . Hypotension    when get up too quickly  . Sleep apnea   . Urinary urgency     Patient Active Problem List   Diagnosis Date Noted  . Asthma   . Acute respiratory failure with hypoxia ([place])   . UTI (urinary tract infection) 02/18/2019  . Carotid stenosis 01/18/2019  . History of fracture of left hip 10/26/2018  . Hip fracture ([place]) 09/28/2018  . Dizziness 09/28/2018  . History of normocytic normochromic anemia 06/13/2018  . Status post total knee replacement using cement, left 12/01/2017  . Synovial cyst of left popliteal space 05/27/2017  . Hypertensive urgency 05/19/2017  . Diabetic polyneuropathy associated with type 2 diabetes mellitus ([place]) 03/30/2017  . Anxiety 05/19/2016  . Insomnia 05/19/2016  .  Pure hypercholesterolemia 05/19/2016  . Personal history of disease 03/11/2016  . Status post hip hemiarthroplasty 10/10/2015  . Hypoxia 09/26/2015  . Hypoxemia 09/26/2015  . Fracture of femoral neck, right ([place]) 09/23/2015  . Diabetes mellitus without complication ([place]) [id number]  . HTN (hypertension) 09/23/2015  . HLD (hyperlipidemia) 09/23/2015  . GERD (gastroesophageal reflux disease) 09/23/2015  . Depression 09/23/2015  . Hypothyroidism 09/23/2015  . DDD (degenerative disc disease), lumbar 08/26/2015  . Lumbar stenosis with neurogenic claudication 08/26/2015  . Nocturia 04/29/2015  . Urinary frequency 04/29/2015  . Atrophic vaginitis 04/29/2015  . Mixed incontinence 06/28/2014  . Lumbar radiculopathy 02/08/2013    Past Surgical History:  Procedure Laterality Date  . ABDOMINAL HYSTERECTOMY    . CYSTOSCOPY    . HIP ARTHROPLASTY Right 09/24/2015   Procedure: ARTHROPLASTY BIPOLAR HIP (HEMIARTHROPLASTY);  Surgeon: Corky Mull, MD;  Location: [facility] ORS;  Service: Orthopedics;  Laterality: Right;  . HIP ARTHROPLASTY Left 09/29/2018   Procedure: ARTHROPLASTY BIPOLAR HIP (HEMIARTHROPLASTY) LEFT;  Surgeon: Corky Mull, MD;  Location: [facility] ORS;  Service: Orthopedics;  Laterality: Left;  . JOINT REPLACEMENT     right hip  . JOINT REPLACEMENT     right knee  . KNEE ARTHROSCOPY W/ AUTOGENOUS CARTILAGE IMPLANTATION (ACI) PROCEDURE    . TOTAL KNEE ARTHROPLASTY Left 12/01/2017   Procedure: TOTAL KNEE ARTHROPLASTY;  Surgeon: Corky Mull, MD;  [facility]: [facility]  ORS;  Service: Orthopedics;  Laterality: Left;  Marland Kitchen VARICOSE VEIN SURGERY    . VEIN SURGERY      Prior to Admission medications   Medication Sig Start Date End Date Taking? Authorizing Provider  acetaminophen (TYLENOL) 500 MG tablet Take 500 mg by mouth every 6 (six) hours as needed.   Yes [provider]  amLODipine (NORVASC) 10 MG tablet Take 10 mg by mouth daily at 2 PM.    Yes [provider]  aspirin EC 81 MG  tablet Take 81 mg by mouth daily.   Yes [provider]  busPIRone (BUSPAR) 10 MG tablet Take 20 mg by mouth 2 (two) times daily.  11/25/16  Yes [provider]  ezetimibe (ZETIA) 10 MG tablet Take 10 mg by mouth daily. 07/17/19  Yes [provider]  hydrALAZINE (APRESOLINE) 50 MG tablet Take 50 mg by mouth 3 (three) times daily. 09/04/19  Yes [provider]  hydrochlorothiazide (HYDRODIURIL) 12.5 MG tablet Take 12.5 mg by mouth daily. 08/30/19  Yes [provider]  levothyroxine (SYNTHROID, LEVOTHROID) 75 MCG tablet Take 75 mcg by mouth daily.    Yes [provider]  losartan (COZAAR) 100 MG tablet Take 100 mg by mouth daily.    Yes [provider]  metFORMIN (GLUCOPHAGE-XR) 500 MG 24 hr tablet Take 500 mg by mouth daily.    Yes [provider]  omeprazole (PRILOSEC) 40 MG capsule Take 40 mg by mouth daily.    Yes [provider]  PARoxetine (PAXIL) 40 MG tablet Take 40 mg by mouth every evening.  09/28/16  Yes [provider]  traMADol (ULTRAM) 50 MG tablet Take 1 tablet (50 mg total) by mouth every 6 (six) hours as needed for moderate pain. 10/02/18  Yes Pyreddy, Reatha Harps, MD  vitamin B-12 (CYANOCOBALAMIN) 500 MCG tablet Take 500 mcg by mouth daily.   Yes [provider]  vitamin E 400 UNIT capsule Take 400 Units by mouth 4 (four) times a week.     [provider]    Allergies Cephalexin, Nitrofurantoin, Sulfa antibiotics, and Atorvastatin  Family History  Problem Relation Age of Onset  . Kidney disease Brother        also nephew  . Heart disease Mother   . Heart disease Father   . Prostate cancer Neg Hx   . Bladder Cancer Neg Hx   . Breast cancer Neg Hx   . Kidney cancer Neg Hx     Social History Social History   Tobacco Use  . Smoking status: Never Smoker  . Smokeless tobacco: Never Used  Substance Use Topics  . Alcohol use: No    Alcohol/week: 0.0 standard drinks  . Drug use:  No    Review of Systems  Constitutional: No fever/chills.  Positive for generalized weakness and malaise. Eyes: No visual changes. ENT: No sore throat. Cardiovascular: Denies chest pain. Respiratory: Denies shortness of breath. Gastrointestinal: No abdominal pain.  No nausea, no vomiting.  No diarrhea.  No constipation. Genitourinary: Negative for dysuria. Musculoskeletal: Negative for back pain. Skin: Negative for rash. Neurological: Negative for headaches, focal weakness or numbness.  ____________________________________________   PHYSICAL EXAM:  VITAL SIGNS: ED Triage Vitals  Enc Vitals Group     BP 11/16/19 0810 (!) 198/73     Pulse Rate 11/16/19 0810 73     Resp 11/16/19 0810 15     Temp 11/16/19 0810 98.2 F (36.8 C)     Temp Source 11/16/19 0810  Oral     SpO2 11/16/19 0810 (!) 78 %     Weight 11/16/19 0812 180 lb (81.6 kg)     Height 11/16/19 0812 5\' 6"  (1.676 m)     Head Circumference --      Peak Flow --      Pain Score 11/16/19 0812 0     Pain Loc --      Pain Edu? --      Excl. in [place]? --     Constitutional: Alert and oriented. Eyes: Conjunctivae are normal. Head: Atraumatic. Nose: No congestion/rhinnorhea. Mouth/Throat: Mucous membranes are moist. Neck: Normal ROM Cardiovascular: Normal rate, regular rhythm. Grossly normal heart sounds. Respiratory: Normal respiratory effort.  No retractions. Lungs CTAB. Gastrointestinal: Soft and nontender. No distention. Genitourinary: deferred Musculoskeletal: No lower extremity tenderness nor edema. Neurologic:  Normal speech and language. No gross focal neurologic deficits are appreciated. Skin:  Skin is warm, dry and intact. No rash noted. Psychiatric: Mood and affect are normal. Speech and behavior are normal.  ____________________________________________   LABS (all labs ordered are listed, but only abnormal results are displayed)  Labs Reviewed  BASIC METABOLIC PANEL - Abnormal; Notable for the  following components:      Result Value   Glucose, Bld 150 (*)    All other components within normal limits  CBC WITH DIFFERENTIAL/PLATELET - Abnormal; Notable for the following components:   Hemoglobin 10.6 (*)    MCH 25.5 (*)    MCHC 29.1 (*)    RDW 16.2 (*)    All other components within normal limits  RESPIRATORY PANEL BY RT PCR (FLU A&B, COVID)  BRAIN NATRIURETIC PEPTIDE  POC SARS CORONAVIRUS 2 AG -  ED  POC SARS CORONAVIRUS 2 AG  TROPONIN I (HIGH SENSITIVITY)  TROPONIN I (HIGH SENSITIVITY)   ____________________________________________  EKG  ED ECG REPORT I, Blake Divine, the attending physician, personally viewed and interpreted this ECG.   Date: 11/16/2019  EKG Time: 8:23  Rate: 78  Rhythm: normal sinus rhythm  Axis: Normal  Intervals:none  ST&T Change: None   PROCEDURES  Procedure(s) performed (including Critical Care):  Procedures   ____________________________________________   INITIAL IMPRESSION / ASSESSMENT AND PLAN / ED COURSE       85 year old female with history of hypertension, hyperlipidemia, and diabetes presents to the ED complaining of generalized weakness and fatigue for the past 2 to 3 days without any other specific symptoms.  She overall appears well, but initial O2 sat noted to be 78% on room air and patient does not typically wear supplemental oxygen.  She has no prior history of lung disease and we will need to assess for COVID-19 versus pneumonia versus PE.  She has no wheezing to suggest bronchospasm.  We will check chest x-ray, labs, point-of-care COVID-19 testing.  EKG shows no evidence of ischemia or arrhythmia.  COVID-19 testing is thus far negative, chest x-ray negative for acute process.  CTA was performed and negative for PE, at this time there is no apparent cause of her hypoxia.  She was trialed off of oxygen and dropped O2 sats into the 80s on room air.  Given this, case was discussed with hospitalist for admission.       ____________________________________________   FINAL CLINICAL IMPRESSION(S) / ED DIAGNOSES  Final diagnoses:  Generalized weakness  Hypoxia     ED Discharge Orders    None       Note:  This document was prepared using Dragon voice recognition software and  may include unintentional dictation errors.   Blake Divine, MD 11/16/19 [phone]

## 2019-11-16 NOTE — ED Notes (Signed)
Pt going to MRI now. Transport tech states new transport order was placed to take pt straight from MRI to floor. 2C RN [employer].

## 2019-11-16 NOTE — ED Triage Notes (Signed)
From EMS to [facility] ED, pt from home.  Per EMS: pt called "not feeling good" and nauseous. EMS VS: BP: 234/93 P:99 last set VS 206/85 P 84; 92% RA. CBG 148   hx DM, HTN, unsure of hyper or hypothyroid.   Pt reported second COVID vaccine 3 days ago, but was not feeling well before.   EMS meds: 4mg  zofran at 0740.

## 2019-11-16 NOTE — ED Notes (Signed)
EDP @ bedside 

## 2019-11-16 NOTE — ED Notes (Signed)
Report given to Elmo Putt, RN Cpod

## 2019-11-16 NOTE — H&P (Signed)
History and Physical    Mary [name] [id number] DOB: 04/24/1935 DOA: 11/16/2019  Referring MD/NP/PA:   PCP: Dion Body, MD   Patient coming from:  The patient is coming from home.  At baseline, pt is independent for most of ADL.        Chief Complaint: Shortness of breath, dizziness  HPI: Mary [name] is a 85 y.o. female with medical history significant of hypertension, hyperlipidemia, diabetes mellitus, GERD, hypothyroidism, depression, who presents with shortness breath and dizziness.  Patient states that she had second dose of COVID-19 vaccination 3 days ago, after that she has not been feeling good.  She has dizziness, generalized weakness.  No unilateral numbness or tingling in extremities.  No facial droop or slurred speech.  She states that she feels room spinning around her, and has ear ringing, no vision loss or hearing loss.  She also has shortness of breath, mild dry cough, but no chest pain, fever or chills.  She has nausea and vomited once yesterday, currently no nausea, vomiting, diarrhea or abdominal pain.  No symptoms of UTI.  Patient has elevated blood pressure 234/93 -->188/82 in ED.  ED Course: pt was found to have troponin 9, 11, BNP 85, pending COVID-19 PCR, electrolytes renal function okay, temperature normal, oxygen desaturated to 95% on room air, which improved to 90-100% on 2 L nasal cannula oxygen, bradycardia, RR 27.  Chest x-ray negative.  CT of the head is negative for acute intracranial abnormalities.  CT angiogram of chest is negative for PE, showed possible pulmonary hypertension.  Patient is placed on MedSurg bed of observation.   Review of Systems:   General: no fevers, chills, no body weight gain, has fatigue HEENT: no blurry vision or sore throat. Has ear ringing Respiratory: has dyspnea, coughing, no wheezing CV: no chest pain, no palpitations GI: no nausea, vomiting, abdominal pain, diarrhea, constipation GU: no dysuria, burning on  urination, increased urinary frequency, hematuria  Ext: no leg edema Neuro: no unilateral weakness, numbness, or tingling, no vision change or hearing loss. Has dizziness. Skin: no rash, no skin tear. MSK: No muscle spasm, no deformity, no limitation of range of movement in spin Heme: No easy bruising.  Travel history: No recent long distant travel.  Allergy:  Allergies  Allergen Reactions  . Cephalexin Hives  . Nitrofurantoin     Other reaction(s): Other (See Comments) Other Reaction: measles-like lesions  . Sulfa Antibiotics Hives  . Atorvastatin Other (See Comments)    Muscle spasms/ muscle pain    Past Medical History:  Diagnosis Date  . Acid reflux   . Arthritis   . Asthma   . Depression   . Diabetes ([place])   . Dysrhythmia   . Heart murmur   . HLD (hyperlipidemia)   . HTN (hypertension)   . Hypotension    when get up too quickly  . Sleep apnea   . Urinary urgency     Past Surgical History:  Procedure Laterality Date  . ABDOMINAL HYSTERECTOMY    . CYSTOSCOPY    . HIP ARTHROPLASTY Right 09/24/2015   Procedure: ARTHROPLASTY BIPOLAR HIP (HEMIARTHROPLASTY);  Surgeon: Corky Mull, MD;  Location: [facility] ORS;  Service: Orthopedics;  Laterality: Right;  . HIP ARTHROPLASTY Left 09/29/2018   Procedure: ARTHROPLASTY BIPOLAR HIP (HEMIARTHROPLASTY) LEFT;  Surgeon: Corky Mull, MD;  Location: [facility] ORS;  Service: Orthopedics;  Laterality: Left;  . JOINT REPLACEMENT     right hip  . JOINT REPLACEMENT  right knee  . KNEE ARTHROSCOPY W/ AUTOGENOUS CARTILAGE IMPLANTATION (ACI) PROCEDURE    . TOTAL KNEE ARTHROPLASTY Left 12/01/2017   Procedure: TOTAL KNEE ARTHROPLASTY;  Surgeon: Corky Mull, MD;  Location: [facility] ORS;  Service: Orthopedics;  Laterality: Left;  Marland Kitchen VARICOSE VEIN SURGERY    . VEIN SURGERY      Social History:  reports that she has never smoked. She has never used smokeless tobacco. She reports that she does not drink alcohol or use drugs.  Family History:  Family  History  Problem Relation Age of Onset  . Kidney disease Brother        also nephew  . Heart disease Mother   . Heart disease Father   . Prostate cancer Neg Hx   . Bladder Cancer Neg Hx   . Breast cancer Neg Hx   . Kidney cancer Neg Hx      Prior to Admission medications   Medication Sig Start Date End Date Taking? Authorizing Provider  acetaminophen (TYLENOL) 500 MG tablet Take 500 mg by mouth every 6 (six) hours as needed.    [provider]  amLODipine (NORVASC) 10 MG tablet Take 10 mg by mouth daily at 2 PM.     [provider]  aspirin EC 81 MG tablet Take 81 mg by mouth daily.    [provider]  busPIRone (BUSPAR) 10 MG tablet Take 20 mg by mouth 2 (two) times daily.  11/25/16   [provider]  carvedilol (COREG) 3.125 MG tablet Take 3.125 mg by mouth 2 (two) times daily with a meal.    [provider]  enoxaparin (LOVENOX) 40 MG/0.4ML injection Inject 0.4 mLs (40 mg total) into the skin daily for 14 days. 10/03/18 10/17/18  Saundra Shelling, MD  furosemide (LASIX) 20 MG tablet Take 20 mg by mouth daily as needed for edema.    [provider]  hydrALAZINE (APRESOLINE) 25 MG tablet Take 1 tablet (25 mg total) by mouth 4 (four) times daily for 30 days. 10/02/18 01/18/19  Saundra Shelling, MD  Hypromellose (ARTIFICIAL TEARS OP) Place 1 drop into both eyes daily as needed (dry eyes).    [provider]  levothyroxine (SYNTHROID, LEVOTHROID) 75 MCG tablet Take 75 mcg by mouth daily.     [provider]  losartan (COZAAR) 100 MG tablet Take 100 mg by mouth daily.     [provider]  lovastatin (MEVACOR) 20 MG tablet Take 20 mg by mouth 3 (three) times a week.    [provider]  metFORMIN (GLUCOPHAGE-XR) 500 MG 24 hr tablet Take 500 mg by mouth daily.     [provider]  omeprazole (PRILOSEC) 40 MG capsule Take 40 mg by mouth daily.     [provider]  oxyCODONE (OXY IR/ROXICODONE) 5 MG  immediate release tablet Take 1-2 tablets (5-10 mg total) by mouth every 4 (four) hours as needed for severe pain or breakthrough pain (pain score 4-6). Patient not taking: Reported on 02/19/2019 10/02/18   Saundra Shelling, MD  PARoxetine (PAXIL) 40 MG tablet Take 40 mg by mouth every evening.  09/28/16   [provider]  polyethylene glycol (MIRALAX / GLYCOLAX) packet Take 17 g by mouth daily. 10/03/18   Saundra Shelling, MD  solifenacin (VESICARE) 10 MG tablet Take 1 tablet (10 mg total) by mouth daily. 02/19/19   McGowan, Larene Beach A, PA-C  traMADol (ULTRAM) 50 MG tablet Take 1 tablet (50 mg total) by mouth every 6 (six)  hours as needed for moderate pain. Patient not taking: Reported on 02/19/2019 10/02/18   Saundra Shelling, MD  vitamin B-12 (CYANOCOBALAMIN) 500 MCG tablet Take 500 mcg by mouth daily.    [provider]  vitamin E 400 UNIT capsule Take 400 Units by mouth 4 (four) times a week.     [provider]    Physical Exam: Vitals:   11/16/19 1245 11/16/19 1300 11/16/19 1315 11/16/19 1645  BP:  (!) 166/122 (!) 158/119 (!) 160/85  Pulse: 62 62 72 (!) 55  Resp: 15 (!) 21 20 20   Temp:      TempSrc:      SpO2: 100% 99% 90% 97%  Weight:      Height:       General: Not in acute distress HEENT:       Eyes: PERRL, EOMI, no scleral icterus.       ENT: No discharge from the ears and nose, no pharynx injection, no tonsillar enlargement.        Neck: No JVD, no bruit, no mass felt. Heme: No neck lymph node enlargement. Cardiac: S1/S2, RRR, No gallops or rubs. Respiratory: No rales, wheezing, rhonchi or rubs. GI: Soft, nondistended, nontender, no rebound pain, no organomegaly, BS present. GU: No hematuria Ext: No pitting leg edema bilaterally. 1+DP/PT pulse bilaterally. Musculoskeletal: No joint deformities, No joint redness or warmth, no limitation of ROM in spin. Skin: No rashes.  Neuro: Alert, oriented X3, cranial nerves II-XII grossly intact, moves all extremities  normally. Psych: Patient is not psychotic, no suicidal or hemocidal ideation.  Labs on Admission: I have personally reviewed following labs and imaging studies  CBC: Recent Labs  Lab 11/16/19 0833  WBC 5.1  NEUTROABS 3.4  HGB 10.6*  HCT 36.4  MCV 87.7  PLT [id number]   Basic Metabolic Panel: Recent Labs  Lab 11/16/19 0833  NA 141  K 3.6  CL 102  CO2 31  GLUCOSE 150*  BUN 11  CREATININE 0.61  CALCIUM 9.2   GFR: Estimated Creatinine Clearance: 58.4 mL/min (by C-G formula based on SCr of 0.61 mg/dL). Liver Function Tests: No results for input(s): AST, ALT, ALKPHOS, BILITOT, PROT, ALBUMIN in the last 168 hours. No results for input(s): LIPASE, AMYLASE in the last 168 hours. No results for input(s): AMMONIA in the last 168 hours. Coagulation Profile: No results for input(s): INR, PROTIME in the last 168 hours. Cardiac Enzymes: No results for input(s): CKTOTAL, CKMB, CKMBINDEX, TROPONINI in the last 168 hours. BNP (last 3 results) No results for input(s): PROBNP in the last 8760 hours. HbA1C: No results for input(s): HGBA1C in the last 72 hours. CBG: No results for input(s): GLUCAP in the last 168 hours. Lipid Profile: No results for input(s): CHOL, HDL, LDLCALC, TRIG, CHOLHDL, LDLDIRECT in the last 72 hours. Thyroid Function Tests: No results for input(s): TSH, T4TOTAL, FREET4, T3FREE, THYROIDAB in the last 72 hours. Anemia Panel: No results for input(s): VITAMINB12, FOLATE, FERRITIN, TIBC, IRON, RETICCTPCT in the last 72 hours. Urine analysis:    Component Value Date/Time   COLORURINE YELLOW (A) 09/29/2018 0108   APPEARANCEUR Clear 02/19/2019 1450   LABSPEC 1.027 09/29/2018 0108   LABSPEC 1.005 12/29/2013 0119   PHURINE 5.0 09/29/2018 0108   GLUCOSEU Negative 02/19/2019 1450   GLUCOSEU Negative 12/29/2013 0119   HGBUR NEGATIVE 09/29/2018 0108   BILIRUBINUR Negative 02/19/2019 1450   BILIRUBINUR Negative 12/29/2013 0119   KETONESUR 5 (A) 09/29/2018 0108    PROTEINUR Negative 02/19/2019 1450  PROTEINUR 30 (A) 09/29/2018 0108   NITRITE Negative 02/19/2019 1450   NITRITE NEGATIVE 09/29/2018 0108   LEUKOCYTESUR Negative 02/19/2019 1450   LEUKOCYTESUR 1+ 12/29/2013 0119   Sepsis Labs: @LABRCNTIP (procalcitonin:4,lacticidven:4) ) Recent Results (from the past 240 hour(s))  Respiratory Panel by RT PCR (Flu A&B, Covid) - Nasopharyngeal Swab     Status: None   Collection Time: 11/16/19 11:32 AM   Specimen: Nasopharyngeal Swab  Result Value Ref Range Status   SARS Coronavirus 2 by RT PCR NEGATIVE NEGATIVE Final    Comment: (NOTE) SARS-CoV-2 target nucleic acids are NOT DETECTED. The SARS-CoV-2 RNA is generally detectable in upper respiratoy specimens during the acute phase of infection. The lowest concentration of SARS-CoV-2 viral copies this assay can detect is 131 copies/mL. A negative result does not preclude SARS-Cov-2 infection and should not be used as the sole basis for treatment or other patient management decisions. A negative result may occur with  improper specimen collection/handling, submission of specimen other than nasopharyngeal swab, presence of viral mutation(s) within the areas targeted by this assay, and inadequate number of viral copies (<131 copies/mL). A negative result must be combined with clinical observations, patient history, and epidemiological information. The expected result is Negative. Fact Sheet for Patients:  PinkCheek.be Fact Sheet for Healthcare Providers:  GravelBags.it This test is not yet ap proved or cleared by the [place] FDA and  has been authorized for detection and/or diagnosis of SARS-CoV-2 by FDA under an Emergency Use Authorization (EUA). This EUA will remain  in effect (meaning this test can be used) for the duration of the COVID-19 declaration under Section 564(b)(1) of the Act, 21 U.S.C. section 360bbb-3(b)(1), unless the  authorization is terminated or revoked sooner.    Influenza A by PCR NEGATIVE NEGATIVE Final   Influenza B by PCR NEGATIVE NEGATIVE Final    Comment: (NOTE) The Xpert Xpress SARS-CoV-2/FLU/RSV assay is intended as an aid in  the diagnosis of influenza from Nasopharyngeal swab specimens and  should not be used as a sole basis for treatment. Nasal washings and  aspirates are unacceptable for Xpert Xpress SARS-CoV-2/FLU/RSV  testing. Fact Sheet for Patients: PinkCheek.be Fact Sheet for Healthcare Providers: GravelBags.it This test is not yet approved or cleared by the [place] FDA and  has been authorized for detection and/or diagnosis of SARS-CoV-2 by  FDA under an Emergency Use Authorization (EUA). This EUA will remain  in effect (meaning this test can be used) for the duration of the  Covid-19 declaration under Section 564(b)(1) of the Act, 21  U.S.C. section 360bbb-3(b)(1), unless the authorization is  terminated or revoked. Performed at [facility], [address] [place], [place] [address]      Radiological Exams on Admission: CT Head Wo Contrast  Result Date: 11/16/2019 CLINICAL DATA:  Nausea, hypertension, abnormal neurologic exam EXAM: CT HEAD WITHOUT CONTRAST TECHNIQUE: Contiguous axial images were obtained from the base of the skull through the vertex without intravenous contrast. COMPARISON:  09/30/2018 FINDINGS: Brain: No acute infarct or hemorrhage. Hypodensities in the bilateral basal ganglia and periventricular white matter are stable consistent with chronic small vessel ischemic changes. The lateral ventricles and remaining midline structures are unremarkable. No acute extra-axial fluid collections. No mass effect. Vascular: No hyperdense vessel or unexpected calcification. Skull: Normal. Negative for fracture or focal lesion. Sinuses/Orbits: No acute finding. Other: None IMPRESSION: 1. Stable  chronic small vessel ischemic changes. No acute intracranial process. Electronically Signed   By: Randa Ngo M.D.   On:  11/16/2019 11:24   CT Angio Chest PE W/Cm &/Or Wo Cm  Result Date: 11/16/2019 CLINICAL DATA:  Shortness of breath EXAM: CT ANGIOGRAPHY CHEST WITH CONTRAST TECHNIQUE: Multidetector CT imaging of the chest was performed using the standard protocol during bolus administration of intravenous contrast. Multiplanar CT image reconstructions and MIPs were obtained to evaluate the vascular anatomy. CONTRAST:  62mL OMNIPAQUE IOHEXOL 350 MG/ML SOLN COMPARISON:  09/26/2015, 03/22/2013 FINDINGS: Cardiovascular: Heart size is mildly enlarged. No pericardial effusion. Satisfactory opacification of the pulmonary arteries. No filling defect to the segmental branch level to suggest pulmonary embolism. Main pulmonary trunk is dilated measuring 3.8 cm in diameter suggesting pulmonary arterial hypertension. Thoracic aorta is nonaneurysmal. Scattered atherosclerotic calcifications of the aorta. Mediastinum/Nodes: No axillary, mediastinal, or hilar lymphadenopathy. Unremarkable thyroid and trachea. Small hiatal hernia. Lungs/Pleura: Benign 6 mm right middle lobe pulmonary nodule (series 5, image 48) is stable from 2014. Minimal linear scarring within the right middle lobe and lingula. No focal airspace consolidation, pleural effusion, or pneumothorax. Upper Abdomen: No acute abnormality. Musculoskeletal: Similar multilevel degenerative changes. No acute osseous findings. No focal chest wall abnormality. Review of the MIP images confirms the above findings. IMPRESSION: 1. No evidence of pulmonary embolism. 2. No acute findings in the chest. 3. Dilation of the main pulmonary trunk suggesting pulmonary arterial hypertension. 4. Aortic Atherosclerosis (ICD10-I70.0). 5. Small hiatal hernia. Electronically Signed   By: Davina Poke D.O.   On: 11/16/2019 11:31   DG Chest Portable 1 View  Result Date:  11/16/2019 CLINICAL DATA:  Shortness of breath. EXAM: PORTABLE CHEST 1 VIEW COMPARISON:  Chest radiograph 09/28/2018 FINDINGS: Heart size within normal limits. Aortic atherosclerosis. Mild atelectasis within the perihilar regions and right lung base. No evidence of airspace consolidation. No evidence of pleural effusion or pneumothorax. No acute bony abnormality. IMPRESSION: Mild atelectasis within the perihilar regions and right lung base. No evidence of airspace consolidation. Aortic atherosclerosis. Electronically Signed   By: Kellie Simmering DO   On: 11/16/2019 08:52     EKG: Independently reviewed.  Sinus rhythm, QTC 462, no obvious ischemic change.   Assessment/Plan Principal Problem:   Acute respiratory failure with hypoxia (HCC) Active Problems:   Diabetes mellitus without complication (HCC)   HTN (hypertension)   HLD (hyperlipidemia)   Depression   Hypothyroidism   Hypertensive urgency   Dizziness   Asthma   Acute respiratory failure with hypoxia [facility]): Patient has oxygen desaturated to 75% on room air.  Etiology is not clear.  CT angiogram is negative for PE, but showed pulmonary hypertension.  Chest x-ray negative.  2D echo on 09/29/2018 showed EF 60-65%, BNP 85.  Does not seem to have CHF exacerbation.  Troponin negative.  Patient has history of asthma, but the patient's lungs clear on auscultation.  No asthma exacerbation.  Possible explanation is flash of pulmonary edema given elevated blood pressure initially 236/73.  -Placed on MedSurg bed for ablation -Bronchodilators -Nasal cannula oxygen to maintain oxygen saturation above 93% on room air. -Blood pressure control  Dizziness: Patient has feeling of the room spinning and ear ringing, indicating possible peripheral vertigo.  But also need to rule out posterior stroke -PT/OT -As needed meclizine -Follow-up MRI of her brain  Asthma: -Bronchodilators  Hypertensive urgency and HTN: Bp 234/93 -->188/82. -prn  Hydralazine -Continue home medications: Amlodipine, hydralazine, HCTZ, Cozaar,  Diabetes mellitus without complication ([place]): Most recent A1c 7.1, poorly controled. Patient is taking Metformin at home -SSI  HLD (hyperlipidemia): -Zetia and pravastatin  Depression -Continue home  BuSpar, Paxil  Hypothyroidism -Synthroid       DVT ppx:  SQ Lovenox Code Status: DNR per her daughter Family Communication: Yes, patient's daughter by phone Disposition Plan:  Anticipate discharge back to previous home environment Consults called: None Admission status: Med-surg bed for obs   Date of Service 11/16/2019    [facility]   If 7PM-7AM, please contact night-coverage [email or web address] 11/16/2019, 5:59 PM

## 2019-11-17 ENCOUNTER — Observation Stay: Payer: Medicare PPO

## 2019-11-17 ENCOUNTER — Observation Stay: Admit: 2019-11-17 | Payer: Medicare PPO

## 2019-11-17 DIAGNOSIS — J9601 Acute respiratory failure with hypoxia: Secondary | ICD-10-CM | POA: Diagnosis not present

## 2019-11-17 LAB — GLUCOSE, CAPILLARY
Glucose-Capillary: 117 mg/dL — ABNORMAL HIGH (ref 70–99)
Glucose-Capillary: 117 mg/dL — ABNORMAL HIGH (ref 70–99)
Glucose-Capillary: 131 mg/dL — ABNORMAL HIGH (ref 70–99)
Glucose-Capillary: 155 mg/dL — ABNORMAL HIGH (ref 70–99)

## 2019-11-17 LAB — TROPONIN I (HIGH SENSITIVITY)
Troponin I (High Sensitivity): 12 ng/L (ref <18)
Troponin I (High Sensitivity): 13 ng/L (ref <18)

## 2019-11-17 MED ORDER — NITROGLYCERIN 0.4 MG SL SUBL: 0.4000 mg | SUBLINGUAL_TABLET | SUBLINGUAL | Status: DC | PRN

## 2019-11-17 MED ORDER — HYDROCHLOROTHIAZIDE 25 MG PO TABS
25.0000 mg | ORAL_TABLET | Freq: Every day | ORAL | Status: DC
  Administered 2019-11-18: 25 mg via ORAL
  Filled 2019-11-17: qty 1

## 2019-11-17 MED ORDER — HYDROCHLOROTHIAZIDE 12.5 MG PO CAPS
12.5000 mg | ORAL_CAPSULE | Freq: Once | ORAL | Status: AC
  Administered 2019-11-17: 12.5 mg via ORAL
  Filled 2019-11-17: qty 1

## 2019-11-17 MED ORDER — ALUM & MAG HYDROXIDE-SIMETH 200-200-20 MG/5ML PO SUSP
30.0000 mL | ORAL | Status: DC | PRN
  Filled 2019-11-17: qty 30

## 2019-11-17 NOTE — Progress Notes (Signed)
PROGRESS NOTE    Mary [name]  [id number] DOB: Oct 16, 1934 DOA: 11/16/2019 PCP: Dion Body, MD   Brief Narrative:  HPI: Mary [name] is a 85 y.o. female with medical history significant of hypertension, hyperlipidemia, diabetes mellitus, GERD, hypothyroidism, depression, who presents with shortness breath and dizziness.  Patient states that she had second dose of COVID-19 vaccination 3 days ago, after that she has not been feeling good.  She has dizziness, generalized weakness.  No unilateral numbness or tingling in extremities.  No facial droop or slurred speech.  She states that she feels room spinning around her, and has ear ringing, no vision loss or hearing loss.  She also has shortness of breath, mild dry cough, but no chest pain, fever or chills.  She has nausea and vomited once yesterday, currently no nausea, vomiting, diarrhea or abdominal pain.  No symptoms of UTI.  Patient has elevated blood pressure 234/93 -->188/82 in ED.  3/27: Patient seen and examined.  Blood pressure improved though still not at goal.  Patient has continued symptoms.  Reports "not feeling right" and that she continues to experience symptoms of dizziness and vertigo.  The symptoms are exacerbated when she moves.  Echocardiogram pending.  CT negative.  CT angio negative.  Patient weaned from supplemental oxygen.   Assessment & Plan:   Principal Problem:   Acute respiratory failure with hypoxia (HCC) Active Problems:   Diabetes mellitus without complication (HCC)   HTN (hypertension)   HLD (hyperlipidemia)   Depression   Hypothyroidism   Hypertensive urgency   Dizziness   Asthma  Hypertensive urgency Patient on multiple blood pressure medications and has had longstanding difficulty with control of systolic blood pressure Her systolic on arrival was greater than 230 Is improved since that time however symptoms persist I suspect the patient may have underlying obstructive sleep apnea that  has been undiagnosed She has never had a sleep study and has had evidence of pulmonary hypertension on CT angiogram I recommend the patient to get a sleep study as an outpatient The patient currently is not at treatment goal for blood pressure management and is still symptomatic Plan: Continue hydrochlorothiazide, increase dose to 25 mg daily Continue hydralazine 50 mg 3 times daily, can increase dose as needed to achieve blood pressure goal Continue Cozaar 100 mg daily Continue Norvasc 10 mg daily Daily renal function and metabolite monitoring  Dizziness MRI brain negative PT consult pending As needed meclizine Suspected dizziness will improve with improved blood pressure control  Asthma Not acutely exacerbated Continue bronchodilators  Diabetes mellitus Most recent A1c 7.1, moderate control Metformin at home, on hold Continue sliding scale insulin  Hyperlipidemia Continue Zetia and pravastatin  Depression Anxiety Continue home BuSpar and Paxil  Hypothyroidism Continue home Synthroid  DVT prophylaxis: Lovenox Code Status: DNR Family Communication: Daughter at bedside Disposition Plan: Anticipate return to previous home environment.  Anticipate discharge on 11/10/2019 if blood pressure controlled   Consultants:   None  Procedures:   None  Antimicrobials:   None   Subjective: Patient seen and examined Blood pressure improved, still dizzy  Objective: Vitals:   11/17/19 0639 11/17/19 1015 11/17/19 1021 11/17/19 1149  BP: (!) 174/55 (!) 143/65 (!) 143/62 (!) 166/71  Pulse: 94 89 83 78  Resp:    18  Temp:    98.1 F (36.7 C)  TempSrc:    Oral  SpO2:  93% 99% 95%  Weight:      Height:  Intake/Output Summary (Last 24 hours) at 11/17/2019 1435 Last data filed at 11/17/2019 0800 Gross per 24 hour  Intake 0 ml  Output 200 ml  Net -200 ml   Filed Weights   11/16/19 0812  Weight: 81.6 kg    Examination:  General exam: Appears calm and  comfortable  Respiratory system: Clear to auscultation. Respiratory effort normal. Cardiovascular system: S1/S2 heard, regular rate and rhythm, 3 out of 6 systolic murmur  gastrointestinal system: Abdomen is nondistended, soft and nontender. No organomegaly or masses felt. Normal bowel sounds heard. Central nervous system: Alert and oriented. No focal neurological deficits. Extremities: Symmetric 5 x 5 power. Skin: No rashes, lesions or ulcers Psychiatry: Judgement and insight appear normal. Mood & affect appropriate.     Data Reviewed: I have personally reviewed following labs and imaging studies  CBC: Recent Labs  Lab 11/16/19 0833  WBC 5.1  NEUTROABS 3.4  HGB 10.6*  HCT 36.4  MCV 87.7  PLT [id number]   Basic Metabolic Panel: Recent Labs  Lab 11/16/19 0833  NA 141  K 3.6  CL 102  CO2 31  GLUCOSE 150*  BUN 11  CREATININE 0.61  CALCIUM 9.2   GFR: Estimated Creatinine Clearance: 58.4 mL/min (by C-G formula based on SCr of 0.61 mg/dL). Liver Function Tests: No results for input(s): AST, ALT, ALKPHOS, BILITOT, PROT, ALBUMIN in the last 168 hours. No results for input(s): LIPASE, AMYLASE in the last 168 hours. No results for input(s): AMMONIA in the last 168 hours. Coagulation Profile: No results for input(s): INR, PROTIME in the last 168 hours. Cardiac Enzymes: No results for input(s): CKTOTAL, CKMB, CKMBINDEX, TROPONINI in the last 168 hours. BNP (last 3 results) No results for input(s): PROBNP in the last 8760 hours. HbA1C: No results for input(s): HGBA1C in the last 72 hours. CBG: Recent Labs  Lab 11/16/19 2112 11/17/19 0752 11/17/19 1146  GLUCAP 130* 117* 117*   Lipid Profile: No results for input(s): CHOL, HDL, LDLCALC, TRIG, CHOLHDL, LDLDIRECT in the last 72 hours. Thyroid Function Tests: No results for input(s): TSH, T4TOTAL, FREET4, T3FREE, THYROIDAB in the last 72 hours. Anemia Panel: No results for input(s): VITAMINB12, FOLATE, FERRITIN, TIBC, IRON,  RETICCTPCT in the last 72 hours. Sepsis Labs: No results for input(s): PROCALCITON, LATICACIDVEN in the last 168 hours.  Recent Results (from the past 240 hour(s))  Respiratory Panel by RT PCR (Flu A&B, Covid) - Nasopharyngeal Swab     Status: None   Collection Time: 11/16/19 11:32 AM   Specimen: Nasopharyngeal Swab  Result Value Ref Range Status   SARS Coronavirus 2 by RT PCR NEGATIVE NEGATIVE Final    Comment: (NOTE) SARS-CoV-2 target nucleic acids are NOT DETECTED. The SARS-CoV-2 RNA is generally detectable in upper respiratoy specimens during the acute phase of infection. The lowest concentration of SARS-CoV-2 viral copies this assay can detect is 131 copies/mL. A negative result does not preclude SARS-Cov-2 infection and should not be used as the sole basis for treatment or other patient management decisions. A negative result may occur with  improper specimen collection/handling, submission of specimen other than nasopharyngeal swab, presence of viral mutation(s) within the areas targeted by this assay, and inadequate number of viral copies (<131 copies/mL). A negative result must be combined with clinical observations, patient history, and epidemiological information. The expected result is Negative. Fact Sheet for Patients:  PinkCheek.be Fact Sheet for Healthcare Providers:  GravelBags.it This test is not yet ap proved or cleared by the [place] and  has been authorized for detection and/or diagnosis of SARS-CoV-2 by FDA under an Emergency Use Authorization (EUA). This EUA will remain  in effect (meaning this test can be used) for the duration of the COVID-19 declaration under Section 564(b)(1) of the Act, 21 U.S.C. section 360bbb-3(b)(1), unless the authorization is terminated or revoked sooner.    Influenza A by PCR NEGATIVE NEGATIVE Final   Influenza B by PCR NEGATIVE NEGATIVE Final    Comment:  (NOTE) The Xpert Xpress SARS-CoV-2/FLU/RSV assay is intended as an aid in  the diagnosis of influenza from Nasopharyngeal swab specimens and  should not be used as a sole basis for treatment. Nasal washings and  aspirates are unacceptable for Xpert Xpress SARS-CoV-2/FLU/RSV  testing. Fact Sheet for Patients: PinkCheek.be Fact Sheet for Healthcare Providers: GravelBags.it This test is not yet approved or cleared by the [place] FDA and  has been authorized for detection and/or diagnosis of SARS-CoV-2 by  FDA under an Emergency Use Authorization (EUA). This EUA will remain  in effect (meaning this test can be used) for the duration of the  Covid-19 declaration under Section 564(b)(1) of the Act, 21  U.S.C. section 360bbb-3(b)(1), unless the authorization is  terminated or revoked. Performed at [facility], [address] [place], [place] [address]          Radiology Studies: CT Head Wo Contrast  Result Date: 11/16/2019 CLINICAL DATA:  Nausea, hypertension, abnormal neurologic exam EXAM: CT HEAD WITHOUT CONTRAST TECHNIQUE: Contiguous axial images were obtained from the base of the skull through the vertex without intravenous contrast. COMPARISON:  09/30/2018 FINDINGS: Brain: No acute infarct or hemorrhage. Hypodensities in the bilateral basal ganglia and periventricular white matter are stable consistent with chronic small vessel ischemic changes. The lateral ventricles and remaining midline structures are unremarkable. No acute extra-axial fluid collections. No mass effect. Vascular: No hyperdense vessel or unexpected calcification. Skull: Normal. Negative for fracture or focal lesion. Sinuses/Orbits: No acute finding. Other: None IMPRESSION: 1. Stable chronic small vessel ischemic changes. No acute intracranial process. Electronically Signed   By: Randa Ngo M.D.   On: 11/16/2019 11:24   CT Angio Chest PE W/Cm  &/Or Wo Cm  Result Date: 11/16/2019 CLINICAL DATA:  Shortness of breath EXAM: CT ANGIOGRAPHY CHEST WITH CONTRAST TECHNIQUE: Multidetector CT imaging of the chest was performed using the standard protocol during bolus administration of intravenous contrast. Multiplanar CT image reconstructions and MIPs were obtained to evaluate the vascular anatomy. CONTRAST:  42mL OMNIPAQUE IOHEXOL 350 MG/ML SOLN COMPARISON:  09/26/2015, 03/22/2013 FINDINGS: Cardiovascular: Heart size is mildly enlarged. No pericardial effusion. Satisfactory opacification of the pulmonary arteries. No filling defect to the segmental branch level to suggest pulmonary embolism. Main pulmonary trunk is dilated measuring 3.8 cm in diameter suggesting pulmonary arterial hypertension. Thoracic aorta is nonaneurysmal. Scattered atherosclerotic calcifications of the aorta. Mediastinum/Nodes: No axillary, mediastinal, or hilar lymphadenopathy. Unremarkable thyroid and trachea. Small hiatal hernia. Lungs/Pleura: Benign 6 mm right middle lobe pulmonary nodule (series 5, image 48) is stable from 2014. Minimal linear scarring within the right middle lobe and lingula. No focal airspace consolidation, pleural effusion, or pneumothorax. Upper Abdomen: No acute abnormality. Musculoskeletal: Similar multilevel degenerative changes. No acute osseous findings. No focal chest wall abnormality. Review of the MIP images confirms the above findings. IMPRESSION: 1. No evidence of pulmonary embolism. 2. No acute findings in the chest. 3. Dilation of the main pulmonary trunk suggesting pulmonary arterial hypertension. 4. Aortic Atherosclerosis (ICD10-I70.0). 5. Small hiatal hernia. Electronically Signed  By: Davina Poke D.O.   On: 11/16/2019 11:31   MR BRAIN WO CONTRAST  Result Date: 11/16/2019 CLINICAL DATA:  85 year old female with weakness, malaise, dizziness. EXAM: MRI HEAD WITHOUT CONTRAST TECHNIQUE: Multiplanar, multiecho pulse sequences of the brain and  surrounding structures were obtained without intravenous contrast. COMPARISON:  Head CT without contrast earlier today. FINDINGS: Brain: No restricted diffusion or evidence of acute infarction. Subcortical white matter encephalomalacia in the posterior right MCA territory. Ex vacuo enlargement of the atrium of the right lateral ventricle. Several small chronic lacunar infarcts identified in the bilateral cerebellum, bilateral basal ganglia. An additional mild to moderate for age scattered T2 and FLAIR hyperintensity in the cerebral white matter and the pons. No chronic cerebral blood products identified. No definite cortical encephalomalacia. No midline shift, mass effect, evidence of mass lesion, ventriculomegaly, extra-axial collection or acute intracranial hemorrhage. Cervicomedullary junction and pituitary are within normal limits. Vascular: Major intracranial vascular flow voids are preserved. Skull and upper cervical spine: Partially visible severe cervical spine degeneration with multilevel mild to moderate spinal stenosis evident-on series 9, image 11. Background bone marrow signal is within normal limits. Sinuses/Orbits: Postoperative changes to both globes. Paranasal sinuses are clear. Other: Mild bilateral mastoid effusions greater on the left. Negative visible pharynx. Grossly negative visible internal auditory structures otherwise. Stylomastoid foramina appear normal. Negative scalp and face soft tissues. IMPRESSION: 1. No acute intracranial abnormality. 2. Chronic ischemic disease in the posterior right MCA territory white matter. Chronic small vessel disease elsewhere. 3. Partially visible cervical spine degeneration appears severe with multilevel mild to moderate spinal stenosis. 4. Mild bilateral mastoid effusions, likely postinflammatory and significance doubtful. Electronically Signed   By: Genevie Ann M.D.   On: 11/16/2019 19:19   DG Chest Port 1 View  Result Date: 11/17/2019 CLINICAL DATA:  Left  sided chest pain from center and radiating laterally x45 minutes. Hx of asthma, dysrhythmia, heart murmur, HTN. EXAM: PORTABLE CHEST - 1 VIEW COMPARISON:  11/16/2019 FINDINGS: Linear scarring or subsegmental atelectasis in the lung bases left greater than right as before. No new infiltrate or overt edema. Heart size upper limits normal. Aortic Atherosclerosis (ICD10-170.0). No pneumothorax.  No effusion. Visualized bones unremarkable. IMPRESSION: No acute cardiopulmonary disease. Electronically Signed   By: Lucrezia Europe M.D.   On: 11/17/2019 11:43   DG Chest Portable 1 View  Result Date: 11/16/2019 CLINICAL DATA:  Shortness of breath. EXAM: PORTABLE CHEST 1 VIEW COMPARISON:  Chest radiograph 09/28/2018 FINDINGS: Heart size within normal limits. Aortic atherosclerosis. Mild atelectasis within the perihilar regions and right lung base. No evidence of airspace consolidation. No evidence of pleural effusion or pneumothorax. No acute bony abnormality. IMPRESSION: Mild atelectasis within the perihilar regions and right lung base. No evidence of airspace consolidation. Aortic atherosclerosis. Electronically Signed   By: Kellie Simmering DO   On: 11/16/2019 08:52        Scheduled Meds: . amLODipine  10 mg Oral Q1400  . aspirin EC  81 mg Oral Daily  . busPIRone  20 mg Oral BID  . dextromethorphan-guaiFENesin  1 tablet Oral BID  . enoxaparin (LOVENOX) injection  40 mg Subcutaneous Q24H  . ezetimibe  10 mg Oral Daily  . hydrALAZINE  50 mg Oral TID  . [START ON 11/18/2019] hydrochlorothiazide  25 mg Oral Daily  . hydrochlorothiazide  12.5 mg Oral Once  . insulin aspart  0-5 Units Subcutaneous QHS  . insulin aspart  0-9 Units Subcutaneous TID WC  . ipratropium  2 puff  Inhalation Q6H  . levothyroxine  75 mcg Oral Daily  . losartan  100 mg Oral Daily  . mouth rinse  15 mL Mouth Rinse BID  . pantoprazole  40 mg Oral Daily  . PARoxetine  40 mg Oral QPM  . vitamin B-12  500 mcg Oral Daily  . vitamin E  400  Units Oral Once per day on Sun Tue Thu Sat   Continuous Infusions:   LOS: 0 days    Time spent: 35 minutes    Sidney Ace, MD [facility] Pager [phone]  If 7PM-7AM, please contact night-coverage 11/17/2019, 2:35 PM

## 2019-11-17 NOTE — Progress Notes (Signed)
PT Cancellation Note  Patient Details Name: Mary [name] MRN: [id number] DOB: 12/12/1934   Cancelled Treatment:    Reason Eval/Treat Not Completed: Medical issues which prohibited therapy(Consult received and chart reviewed.  Per notes, patient with recent episode of L chest/under breast pain; now pending EKG, troponins, echo and CXR.  Will hold evaluation until additional work up complete and patient cleared for activity.)   Maher Shon H. Owens Shark, PT, DPT, NCS 11/17/19, 11:54 AM [phone]

## 2019-11-17 NOTE — Evaluation (Signed)
Occupational Therapy Evaluation Patient Details Name: Mary [name] MRN: [id number] DOB: 1935/03/12 Today's Date: 11/17/2019    History of Present Illness Cathlene B Benge is a 85 y.o. female with medical history significant of hypertension, hyperlipidemia, diabetes mellitus, GERD, hypothyroidism, depression, who presents with shortness breath and dizziness.   Clinical Impression   Patient at EOB eating breakfast with sister upon entry and agreeable to therapy.  Patient reports no pain at this time.  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  Reports there are caregivers who come a few days a week to provide care for daughter.  Patient uses [facility] and 2[employer] for functional mobility and occasionally drives.  Patient presents with dizziness/verigo and elevated blood pressure.  MRI negative for infarct.  Initially vitals at 160/60, 74 HR and 97% 02 on RA.  Patient performed simple grooming at EOB with set up and performed functional transfers/mobility with 2WW with CGA and extra time.  HR elevated to 100 during activity.  Vitals after activity at 162/60, 97 HR and 02 at 84% on RA.  02 able to rebound quickly (under 20 seconds) to 98% with education on PLB.  Patient participated well and states she feels close to baseline.  Patient would benefit from continued occupational therapy treatment to address deficits in performance components outlined below.  At this time, no follow up OT is recommended.  However, the patient would benefit from increased SPV and assistance at home.  Sister states she lives next door and is available to assist as needed.    Follow Up Recommendations  No OT follow up;Supervision/Assistance - 24 hour    Equipment Recommendations  None recommended by OT    Recommendations for Other Services       Precautions / Restrictions Precautions Precautions: Fall Precaution Comments: Monitor BP and 02 with activity. Restrictions Weight Bearing  Restrictions: No Other Position/Activity Restrictions: Please address weaning supplemental 02.      Mobility Bed Mobility               General bed mobility comments: Seated at EOB upon entry  Transfers Overall transfer level: Needs assistance Equipment used: Rolling walker (2 wheeled) Transfers: Sit to/from [facility] Sit to Stand: Min guard Stand pivot transfers: Min guard       General transfer comment: Demonstrates good safety with body mechanics    Balance Overall balance assessment: Mild deficits observed, not formally tested                                         ADL either performed or assessed with clinical judgement   ADL Overall ADL's : Needs assistance/impaired     Grooming: Wash/dry face;Wash/dry hands;Sitting;Set up   Upper Body Bathing: Supervision/ safety;Sitting   Lower Body Bathing: Supervison/ safety;Sitting/lateral leans       Lower Body Dressing: Sit to/from stand;Supervision/safety;Min guard Lower Body Dressing Details (indicate cue type and reason): SBA Toilet Transfer: Min guard;Supervision/safety;RW   Toileting- Clothing Manipulation and Hygiene: Supervision/safety;Min guard       Functional mobility during ADLs: Min guard;Rolling walker;Cueing for safety General ADL Comments: Patient reports she feels physically close to baseline, but continues to endorse slight dizziness with activity and movement of head.     Vision Patient Visual Report: No change from baseline       Perception  Praxis      Pertinent Vitals/Pain Pain Assessment: No/denies pain     Hand Dominance Right   Extremity/Trunk Assessment Upper Extremity Assessment Upper Extremity Assessment: Overall WFL for tasks assessed(normal age-related weakness)   Lower Extremity Assessment Lower Extremity Assessment: Overall WFL for tasks assessed;Defer to PT evaluation   Cervical / Trunk Assessment Cervical / Trunk  Assessment: Normal   Communication Communication Communication: No difficulties   Cognition Arousal/Alertness: Awake/alert Behavior During Therapy: WFL for tasks assessed/performed Overall Cognitive Status: Within Functional Limits for tasks assessed                                 General Comments: A&Ox4, cooperative and pleasant   General Comments  Requires extra time to perform tasks due to deficits in activity tolerance.  Pt's 02 dropped to 84% on RA with activity, but rebounded in under 20 seconds with seated rest and PLB.    Exercises Other Exercises Other Exercises: Provided education on goals and role of OT in acute care Other Exercises: Provided education on use of energy consrvation techniques to improve safety within home Other Exercises: Completed functional transfers and mobility on RA to address weaning off supplemental 02.  Provided education on vital limits (BP, HR, 02)   Shoulder Instructions      Home Living Family/patient expects to be discharged to:: Private residence Living Arrangements: Children(Patient lives with daughter who has CP.  Pt is caregiver for daughter.) Available Help at Discharge: Family;Available PRN/intermittently;Other (Comment);Neighbor(Sister lives next door.  Assistants also come in to help daughter with CP.) Type of Home: House Home Access: Stairs to enter;Ramped entrance Entrance Stairs-Number of Steps: 4 (front), 3 (garage), 3 (patio) Entrance Stairs-Rails: Can reach both Home Layout: One level     Bathroom Shower/Tub: Walk-in shower         Home Equipment: Environmental consultant - 2 wheels;Cane - single point;Shower seat;Walker - 4 wheels          Prior Functioning/Environment Level of Independence: Independent with assistive device(s)        Comments: Patient MOD I with (B/I)ADLs.  Occasionally will ask for assistance with community tasks.  Pt states she uses SPC or FWW depending on how she feels.        OT Problem  List: Decreased strength;Decreased activity tolerance;Impaired balance (sitting and/or standing)      OT Treatment/Interventions: Self-care/ADL training;Therapeutic exercise;Energy conservation;DME and/or AE instruction;Therapeutic activities;Patient/family education    OT Goals(Current goals can be found in the care plan section) Acute Rehab OT Goals Patient Stated Goal: "feel better" OT Goal Formulation: With patient Time For Goal Achievement: 12/01/19 Potential to Achieve Goals: Good  OT Frequency: Min 1X/week   Barriers to D/C: Decreased caregiver support;Other (comment)  Patient is the primary caregiver for her daughter with CP.  Recommending increased support and assistance due to fluctuating BP/dizziness and impairment in activity tolerance.       Co-evaluation              AM-PAC OT "6 Clicks" Daily Activity     Outcome Measure Help from another person eating meals?: None Help from another person taking care of personal grooming?: None Help from another person toileting, which includes using toliet, bedpan, or urinal?: A Little Help from another person bathing (including washing, rinsing, drying)?: A Little Help from another person to put on and taking off regular upper body clothing?: None Help from another person to put on  and taking off regular lower body clothing?: A Little 6 Click Score: 21   End of Session Equipment Utilized During Treatment: Gait belt;Rolling walker Nurse Communication: Mobility status;Other (comment)(Spoke to RN discussing 02 sats & BP during functional activity)  Activity Tolerance: Patient tolerated treatment well;No increased pain Patient left: in bed;with call bell/phone within reach;with nursing/sitter in room;with family/visitor present  OT Visit Diagnosis: Unsteadiness on feet (R26.81);History of falling (Z91.81)                Time: [id number] OT Time Calculation (min): 37 min Charges:  OT General Charges $OT Visit: 1 Visit OT  Evaluation $OT Eval Low Complexity: 1 Low OT Treatments $Therapeutic Activity: 23-37 mins  Baldomero Lamy, MS, OTR/L 11/17/19, 11:01 AM

## 2019-11-17 NOTE — Evaluation (Signed)
Physical Therapy Evaluation Patient Details Name: Mary [name] MRN: [id number] DOB: May 23, 1935 Today's Date: 11/17/2019   History of Present Illness  Christa B Cieslik is a 85 y.o. female with medical history significant of hypertension, hyperlipidemia, diabetes mellitus, GERD, hypothyroidism, depression, who presents with shortness breath and dizziness.  Admitted for management of acute respiratory failure with hypoxia.  Clinical Impression  Upon evaluation, patient alert and oriented; follows commands and demonstrates good effort with all mobility tasks.  Bilat UE/LE strength and ROM grossly symmetrical and WFL; no focal weakness appreciated.  Able to complete bed mobility indep; sit/stand, basic transfers and gait (140') with RW, cga/min assist.  Demonstrates reciprocal stepping pattern with decreased cadence and overall gait speed, but no overt buckling or LOB.  Slightly guarded with head turns and balance reactions (limited functional reach); do recommend continued use of RW at this time. Of note, vitals stable and WFL throughout session; minimal/no complaints of dizziness with mobility efforts during session. Would benefit from skilled PT to address above deficits and promote optimal return to PLOF.; Recommend transition to HHPT upon discharge from acute hospitalization.     Follow Up Recommendations Home health PT    Equipment Recommendations       Recommendations for Other Services       Precautions / Restrictions Precautions Precautions: Fall Restrictions Weight Bearing Restrictions: No      Mobility  Bed Mobility Overal bed mobility: Modified Independent                Transfers Overall transfer level: Needs assistance Equipment used: Rolling walker (2 wheeled) Transfers: Sit to/from Stand Sit to Stand: Min guard         General transfer comment: does require UE support for lift off and stabilization; broad BOS  Ambulation/Gait Ambulation/Gait assistance:  Min guard Gait Distance (Feet): 140 Feet Assistive device: Rolling walker (2 wheeled)       General Gait Details: reciprocal stepping pattern with decreased cadence and overall gait speed, but no overt buckling or LOB.  Slightly guarded with head turns and balance reactions; do recommend continued use of RW at this time.  Stairs            Wheelchair Mobility    Modified Rankin (Stroke Patients Only)       Balance Overall balance assessment: Needs assistance Sitting-balance support: No upper extremity supported;Feet supported Sitting balance-Leahy Scale: Good     Standing balance support: Bilateral upper extremity supported Standing balance-Leahy Scale: Fair                               Pertinent Vitals/Pain Pain Assessment: No/denies pain    Home Living Family/patient expects to be discharged to:: Private residence Living Arrangements: Children(patient is caregiver for daughter with CP) Available Help at Discharge: Family;Available PRN/intermittently;Other (Comment);Neighbor Type of Home: House Home Access: Stairs to enter;Ramped entrance Entrance Stairs-Rails: Can reach both Entrance Stairs-Number of Steps: 4 (front), 3 (garage), 3 (patio) Home Layout: One level Home Equipment: Walker - 2 wheels;Cane - single point;Shower seat;Walker - 4 wheels      Prior Function Level of Independence: Independent with assistive device(s)         Comments: Patient MOD I with (B/I)ADLs.  Occasionally will ask for assistance with community tasks.  Pt states she uses SPC or FWW depending on how she feels.  1-2 falls within previous year     Hand Dominance   Dominant Hand:  Right    Extremity/Trunk Assessment   Upper Extremity Assessment Upper Extremity Assessment: Overall WFL for tasks assessed    Lower Extremity Assessment Lower Extremity Assessment: Overall WFL for tasks assessed       Communication   Communication: No difficulties  Cognition  Arousal/Alertness: Awake/alert Behavior During Therapy: WFL for tasks assessed/performed Overall Cognitive Status: Within Functional Limits for tasks assessed                                        General Comments      Exercises Other Exercises Other Exercises: Orthostatic assessment-see vitals flowsheet for results; stable and WFL. Asymptomatic at this time. Other Exercises: Toilet transfer, ambulatory with RW, cga/close sup; sit/stand from stadard toilet with RW, sup/mod indep; standing balacne at sink for hand hygiene, close sup. Does require UE stabilization for balance and extension of fucntioanl reach as necessary.   Assessment/Plan    PT Assessment Patient needs continued PT services  PT Problem List Decreased activity tolerance;Decreased balance;Decreased mobility;Cardiopulmonary status limiting activity       PT Treatment Interventions DME instruction;Gait training;Stair training;Functional mobility training;Therapeutic activities;Therapeutic exercise;Balance training;Patient/family education    PT Goals (Current goals can be found in the Care Plan section)  Acute Rehab PT Goals Patient Stated Goal: "feel better" PT Goal Formulation: With patient Time For Goal Achievement: 12/01/19 Potential to Achieve Goals: Good    Frequency Min 2X/week   Barriers to discharge Decreased caregiver support      Co-evaluation               AM-PAC PT "6 Clicks" Mobility  Outcome Measure Help needed turning from your back to your side while in a flat bed without using bedrails?: None Help needed moving from lying on your back to sitting on the side of a flat bed without using bedrails?: None Help needed moving to and from a bed to a chair (including a wheelchair)?: A Little Help needed standing up from a chair using your arms (e.g., wheelchair or bedside chair)?: A Little Help needed to walk in hospital room?: A Little Help needed climbing 3-5 steps with a  railing? : A Little 6 Click Score: 20    End of Session Equipment Utilized During Treatment: Gait belt Activity Tolerance: Patient tolerated treatment well Patient left: in chair;with call bell/phone within reach;with chair alarm set Nurse Communication: Mobility status PT Visit Diagnosis: Muscle weakness (generalized) (M62.81);Difficulty in walking, not elsewhere classified (R26.2)    Time: [id number] PT Time Calculation (min) (ACUTE ONLY): 23 min   Charges:   PT Evaluation $PT Eval Moderate Complexity: 1 Mod PT Treatments $Therapeutic Activity: 8-22 mins        Kholton Coate H. Owens Shark, PT, DPT, NCS 11/17/19, 4:40 PM [phone]

## 2019-11-17 NOTE — Progress Notes (Addendum)
Patient c/o of "nawing pain" under left breast which radiates through to lower back. Per Cardiac Monitoring, patient had a 168 run of SVT, but decreased back into the 60's. Per MD obtain EKG. Troponin's, Echo and CXR pending results.   Fuller Mandril, RN

## 2019-11-18 ENCOUNTER — Inpatient Hospital Stay (HOSPITAL_COMMUNITY)
Admit: 2019-11-18 | Discharge: 2019-11-18 | Disposition: A | Discharge status: Home / Self Care | Payer: Medicare PPO | Attending: Internal Medicine | Admitting: Internal Medicine

## 2019-11-18 DIAGNOSIS — R0602 Shortness of breath: Secondary | ICD-10-CM | POA: Diagnosis not present

## 2019-11-18 DIAGNOSIS — R55 Syncope and collapse: Secondary | ICD-10-CM | POA: Diagnosis not present

## 2019-11-18 DIAGNOSIS — J9601 Acute respiratory failure with hypoxia: Secondary | ICD-10-CM | POA: Diagnosis not present

## 2019-11-18 LAB — ECHOCARDIOGRAM COMPLETE
Height: 66 in
Weight: 2880 oz

## 2019-11-18 LAB — GLUCOSE, CAPILLARY
Glucose-Capillary: 127 mg/dL — ABNORMAL HIGH (ref 70–99)
Glucose-Capillary: 114 mg/dL — ABNORMAL HIGH (ref 70–99)

## 2019-11-18 MED ORDER — HYDRALAZINE HCL 50 MG PO TABS
100.0000 mg | ORAL_TABLET | Freq: Three times a day (TID) | ORAL | Status: DC
  Administered 2019-11-18: 100 mg via ORAL
  Filled 2019-11-18: qty 2

## 2019-11-18 MED ORDER — MECLIZINE HCL 12.5 MG PO TABS: 12.5000 mg | ORAL_TABLET | Freq: Three times a day (TID) | ORAL | 0 refills | Status: DC | PRN

## 2019-11-18 MED ORDER — HYDROCHLOROTHIAZIDE 25 MG PO TABS: 25.0000 mg | ORAL_TABLET | Freq: Every day | ORAL | 0 refills | Status: DC

## 2019-11-18 MED ORDER — HYDRALAZINE HCL 100 MG PO TABS: 100.0000 mg | ORAL_TABLET | Freq: Three times a day (TID) | ORAL | 0 refills | Status: DC

## 2019-11-18 NOTE — Discharge Summary (Signed)
Physician Discharge Summary  Mary [name] [id number] DOB: 12/15/1934 DOA: 11/16/2019  PCP: Dion Body, MD  Admit date: 11/16/2019 Discharge date: 11/18/2019  Admitted From: Home Disposition: Home with home health  Recommendations for Outpatient Follow-up:  1. Follow up with PCP in 1-2 weeks 2. Request a referral to outpatient sleep study  Home Health: Yes Equipment/Devices: None Discharge Condition: Stable CODE STATUS: Full Diet recommendation: Heart Healthy  Brief/Interim Summary: [id number] Mary [name] a 85 y.o.femalewith medical history significant ofhypertension, hyperlipidemia, diabetes mellitus, GERD, hypothyroidism, depression, who presents with shortness breath and dizziness.  Patient states that she had second dose of COVID-19 vaccination 3 days ago, after that she has not been feeling good. She has dizziness, generalized weakness.No unilateral numbness or tingling inextremities. No facial droop or slurred speech. She states that she feelsroom spinning around her, and has ear ringing,no vision loss or hearing loss. She also has shortness of breath, mild dry cough, but no chest pain, fever or chills. She has nausea and vomited once yesterday, currently no nausea,vomiting, diarrhea or abdominal pain. No symptoms of UTI.Patient has elevated blood pressure 234/93 -->188/82 in ED.  3/27: Patient seen and examined.  Blood pressure improved though still not at goal.  Patient has continued symptoms.  Reports "not feeling right" and that she continues to experience symptoms of dizziness and vertigo.  The symptoms are exacerbated when she moves.  Echocardiogram pending.  CT negative.  CT angio negative.  Patient weaned from supplemental oxygen.  3/28: Patient seen and examined.  Blood pressure improved after increases in doses of both hydrochlorothiazide and hydralazine.  Blood pressure still not at goal.  Systolics 0000000.  Echocardiogram reviewed.  Normal ejection  fraction.  Patient discharged home in stable condition.  Home health ordered on discharge.  Patient instructed to see her primary care physician within 1 week of discharge and inquire about referral to outpatient sleep medicine.  Patient expressed understanding.  Patient sister at bedside.  All questions answered.   Discharge Diagnoses:  Principal Problem:   Acute respiratory failure with hypoxia ([place]) Active Problems:   Diabetes mellitus without complication (HCC)   HTN (hypertension)   HLD (hyperlipidemia)   Depression   Hypothyroidism   Hypertensive urgency   Dizziness   Asthma  Hypertensive urgency Patient on multiple blood pressure medications and has had longstanding difficulty with control of systolic blood pressure Her systolic on arrival was greater than 230 Is improved since that time however symptoms persist I suspect the patient may have underlying obstructive sleep apnea that has been undiagnosed She has never had a sleep study and has had evidence of pulmonary hypertension on CT angiogram I recommend the patient to get a sleep study as an outpatient Patient symptoms improved with better blood pressure control Plan: Continue hydrochlorothiazide, dose increased 25 mg daily Continue hydralazine, dose increased 200 mg 3 times daily Continue Cozaar 100 mg daily Continue Norvasc 10 mg daily  Resume above regimen on discharge.  See your PCP within 1 week  Dizziness MRI brain negative PT consult, recommending home with home health As needed meclizine, prescribed on discharge Suspected dizziness will improve with improved blood pressure control  Asthma Not acutely exacerbated Continue bronchodilators  Diabetes mellitus Most recent A1c 7.1, moderate control Metformin at home, on hold Continue sliding scale insulin  Hyperlipidemia Continue Zetia and pravastatin  Depression Anxiety Continue home BuSpar and Paxil  Hypothyroidism Continue home  Synthroid  Discharge Instructions  Discharge Instructions    Diet - low sodium heart  healthy   Complete by: As directed    Discharge instructions   Complete by: As directed    He has difficulty control hypertension.  I suspect he may have an element of sleep apnea.  Please discuss this with your primary care physician.  Recommend you referral to outpatient pulmonology for a sleep study.  I have increased the dose of 2 of your home blood pressure medications.  Hydrochlorothiazide has been increased to 25 mg daily.  Hydralazine has not been increased to 100 mg 3 times a day.  The amlodipine and losartan will be the same dose as before.  Please monitor your blood pressure at home.  I recommend writing it down and bringing a log of your blood pressures to your primary care physician's office.   Increase activity slowly   Complete by: As directed      Allergies as of 11/18/2019      Reactions   Cephalexin Hives   Nitrofurantoin    Other reaction(s): Other (See Comments) Other Reaction: measles-like lesions   Sulfa Antibiotics Hives   Atorvastatin Other (See Comments)   Muscle spasms/ muscle pain      Medication List    TAKE these medications   acetaminophen 500 MG tablet Commonly known as: TYLENOL Take 500 mg by mouth every 6 (six) hours as needed.   amLODipine 10 MG tablet Commonly known as: NORVASC Take 10 mg by mouth daily at 2 PM.   aspirin EC 81 MG tablet Take 81 mg by mouth daily.   busPIRone 10 MG tablet Commonly known as: BUSPAR Take 20 mg by mouth 2 (two) times daily.   ezetimibe 10 MG tablet Commonly known as: ZETIA Take 10 mg by mouth daily.   hydrALAZINE 100 MG tablet Commonly known as: APRESOLINE Take 1 tablet (100 mg total) by mouth 3 (three) times daily. What changed:   medication strength  how much to take   hydrochlorothiazide 25 MG tablet Commonly known as: HYDRODIURIL Take 1 tablet (25 mg total) by mouth daily. What changed:   medication  strength  how much to take   levothyroxine 75 MCG tablet Commonly known as: SYNTHROID Take 75 mcg by mouth daily.   losartan 100 MG tablet Commonly known as: COZAAR Take 100 mg by mouth daily.   meclizine 12.5 MG tablet Commonly known as: ANTIVERT Take 1 tablet (12.5 mg total) by mouth 3 (three) times daily as needed for dizziness.   metFORMIN 500 MG 24 hr tablet Commonly known as: GLUCOPHAGE-XR Take 500 mg by mouth daily.   omeprazole 40 MG capsule Commonly known as: PRILOSEC Take 40 mg by mouth daily.   PARoxetine 40 MG tablet Commonly known as: PAXIL Take 40 mg by mouth every evening.   traMADol 50 MG tablet Commonly known as: ULTRAM Take 1 tablet (50 mg total) by mouth every 6 (six) hours as needed for moderate pain.   vitamin Mary-12 500 MCG tablet Commonly known as: CYANOCOBALAMIN Take 500 mcg by mouth daily.   vitamin E 180 MG (400 UNITS) capsule Take 400 Units by mouth 4 (four) times a week.       Allergies  Allergen Reactions  . Cephalexin Hives  . Nitrofurantoin     Other reaction(s): Other (See Comments) Other Reaction: measles-like lesions  . Sulfa Antibiotics Hives  . Atorvastatin Other (See Comments)    Muscle spasms/ muscle pain    Consultations:  none   Procedures/Studies: CT Head Wo Contrast  Result Date: 11/16/2019 CLINICAL DATA:  Nausea, hypertension, abnormal neurologic exam EXAM: CT HEAD WITHOUT CONTRAST TECHNIQUE: Contiguous axial images were obtained from the base of the skull through the vertex without intravenous contrast. COMPARISON:  09/30/2018 FINDINGS: Brain: No acute infarct or hemorrhage. Hypodensities in the bilateral basal ganglia and periventricular white matter are stable consistent with chronic small vessel ischemic changes. The lateral ventricles and remaining midline structures are unremarkable. No acute extra-axial fluid collections. No mass effect. Vascular: No hyperdense vessel or unexpected calcification. Skull:  Normal. Negative for fracture or focal lesion. Sinuses/Orbits: No acute finding. Other: None IMPRESSION: 1. Stable chronic small vessel ischemic changes. No acute intracranial process. Electronically Signed   By: Randa Ngo M.D.   On: 11/16/2019 11:24   CT Angio Chest PE W/Cm &/Or Wo Cm  Result Date: 11/16/2019 CLINICAL DATA:  Shortness of breath EXAM: CT ANGIOGRAPHY CHEST WITH CONTRAST TECHNIQUE: Multidetector CT imaging of the chest was performed using the standard protocol during bolus administration of intravenous contrast. Multiplanar CT image reconstructions and MIPs were obtained to evaluate the vascular anatomy. CONTRAST:  95mL OMNIPAQUE IOHEXOL 350 MG/ML SOLN COMPARISON:  09/26/2015, 03/22/2013 FINDINGS: Cardiovascular: Heart size is mildly enlarged. No pericardial effusion. Satisfactory opacification of the pulmonary arteries. No filling defect to the segmental branch level to suggest pulmonary embolism. Main pulmonary trunk is dilated measuring 3.8 cm in diameter suggesting pulmonary arterial hypertension. Thoracic aorta is nonaneurysmal. Scattered atherosclerotic calcifications of the aorta. Mediastinum/Nodes: No axillary, mediastinal, or hilar lymphadenopathy. Unremarkable thyroid and trachea. Small hiatal hernia. Lungs/Pleura: Benign 6 mm right middle lobe pulmonary nodule (series 5, image 48) is stable from 2014. Minimal linear scarring within the right middle lobe and lingula. No focal airspace consolidation, pleural effusion, or pneumothorax. Upper Abdomen: No acute abnormality. Musculoskeletal: Similar multilevel degenerative changes. No acute osseous findings. No focal chest wall abnormality. Review of the MIP images confirms the above findings. IMPRESSION: 1. No evidence of pulmonary embolism. 2. No acute findings in the chest. 3. Dilation of the main pulmonary trunk suggesting pulmonary arterial hypertension. 4. Aortic Atherosclerosis (ICD10-I70.0). 5. Small hiatal hernia. Electronically  Signed   By: Davina Poke D.O.   On: 11/16/2019 11:31   MR BRAIN WO CONTRAST  Result Date: 11/16/2019 CLINICAL DATA:  85 year old female with weakness, malaise, dizziness. EXAM: MRI HEAD WITHOUT CONTRAST TECHNIQUE: Multiplanar, multiecho pulse sequences of the brain and surrounding structures were obtained without intravenous contrast. COMPARISON:  Head CT without contrast earlier today. FINDINGS: Brain: No restricted diffusion or evidence of acute infarction. Subcortical white matter encephalomalacia in the posterior right MCA territory. Ex vacuo enlargement of the atrium of the right lateral ventricle. Several small chronic lacunar infarcts identified in the bilateral cerebellum, bilateral basal ganglia. An additional mild to moderate for age scattered T2 and FLAIR hyperintensity in the cerebral white matter and the pons. No chronic cerebral blood products identified. No definite cortical encephalomalacia. No midline shift, mass effect, evidence of mass lesion, ventriculomegaly, extra-axial collection or acute intracranial hemorrhage. Cervicomedullary junction and pituitary are within normal limits. Vascular: Major intracranial vascular flow voids are preserved. Skull and upper cervical spine: Partially visible severe cervical spine degeneration with multilevel mild to moderate spinal stenosis evident-on series 9, image 11. Background bone marrow signal is within normal limits. Sinuses/Orbits: Postoperative changes to both globes. Paranasal sinuses are clear. Other: Mild bilateral mastoid effusions greater on the left. Negative visible pharynx. Grossly negative visible internal auditory structures otherwise. Stylomastoid foramina appear normal. Negative scalp and face soft tissues. IMPRESSION: 1. No acute intracranial abnormality. 2.  Chronic ischemic disease in the posterior right MCA territory white matter. Chronic small vessel disease elsewhere. 3. Partially visible cervical spine degeneration appears  severe with multilevel mild to moderate spinal stenosis. 4. Mild bilateral mastoid effusions, likely postinflammatory and significance doubtful. Electronically Signed   By: Genevie Ann M.D.   On: 11/16/2019 19:19   DG Chest Port 1 View  Result Date: 11/17/2019 CLINICAL DATA:  Left sided chest pain from center and radiating laterally x45 minutes. Hx of asthma, dysrhythmia, heart murmur, HTN. EXAM: PORTABLE CHEST - 1 VIEW COMPARISON:  11/16/2019 FINDINGS: Linear scarring or subsegmental atelectasis in the lung bases left greater than right as before. No new infiltrate or overt edema. Heart size upper limits normal. Aortic Atherosclerosis (ICD10-170.0). No pneumothorax.  No effusion. Visualized bones unremarkable. IMPRESSION: No acute cardiopulmonary disease. Electronically Signed   By: Lucrezia Europe M.D.   On: 11/17/2019 11:43   DG Chest Portable 1 View  Result Date: 11/16/2019 CLINICAL DATA:  Shortness of breath. EXAM: PORTABLE CHEST 1 VIEW COMPARISON:  Chest radiograph 09/28/2018 FINDINGS: Heart size within normal limits. Aortic atherosclerosis. Mild atelectasis within the perihilar regions and right lung base. No evidence of airspace consolidation. No evidence of pleural effusion or pneumothorax. No acute bony abnormality. IMPRESSION: Mild atelectasis within the perihilar regions and right lung base. No evidence of airspace consolidation. Aortic atherosclerosis. Electronically Signed   By: Kellie Simmering DO   On: 11/16/2019 08:52   ECHOCARDIOGRAM COMPLETE  Result Date: 11/18/2019    ECHOCARDIOGRAM REPORT   Patient Name:   Mary [name] Date of Exam: 11/18/2019 Medical Rec #:  [id number]     Height:       66.0 in Accession #:    [id number]    Weight:       180.0 lb Date of Birth:  Jan 01, 1935     BSA:          1.912 m Patient Age:    40 years      BP:           171/52 mmHg Patient Gender: F             HR:           75 bpm. Exam Location:  [facility] Procedure: 2D Echo Indications:     Syncope  History:         Patient  has prior history of Echocardiogram examinations. Risk                  Factors:Hypertension, Diabetes and Dyslipidemia.  Sonographer:     LTM Referring Phys:  [id number] Sidney Ace Diagnosing Phys: Ida Rogue MD IMPRESSIONS  1. Left ventricular ejection fraction, by estimation, is 60 to 65%. The left ventricle has normal function. The left ventricle has no regional wall motion abnormalities. There is moderate left ventricular hypertrophy. Left ventricular diastolic parameters are consistent with Grade I diastolic dysfunction (impaired relaxation).  2. Right ventricular systolic function is normal. The right ventricular size is normal. There is normal pulmonary artery systolic pressure.  3. Left atrial size was mildly dilated. FINDINGS  Left Ventricle: Left ventricular ejection fraction, by estimation, is 60 to 65%. The left ventricle has normal function. The left ventricle has no regional wall motion abnormalities. The left ventricular internal cavity size was normal in size. There is  moderate left ventricular hypertrophy. Left ventricular diastolic parameters are consistent with Grade I diastolic dysfunction (impaired relaxation). Right Ventricle: The right ventricular size is normal. No increase  in right ventricular wall thickness. Right ventricular systolic function is normal. There is normal pulmonary artery systolic pressure. The tricuspid regurgitant velocity is 2.20 m/s, and  with an assumed right atrial pressure of 10 mmHg, the estimated right ventricular systolic pressure is [id number]. Left Atrium: Left atrial size was mildly dilated. Right Atrium: Right atrial size was normal in size. Pericardium: There is no evidence of pericardial effusion. Mitral Valve: The mitral valve is normal in structure. Normal mobility of the mitral valve leaflets. No evidence of mitral valve regurgitation. No evidence of mitral valve stenosis. Tricuspid Valve: The tricuspid valve is normal in structure. Tricuspid  valve regurgitation is not demonstrated. No evidence of tricuspid stenosis. Aortic Valve: The aortic valve is normal in structure. Aortic valve regurgitation is not visualized. Mild aortic valve sclerosis is present, with no evidence of aortic valve stenosis. Aortic valve mean gradient measures 8.0 mmHg. Aortic valve peak gradient measures 13.5 mmHg. Aortic valve area, by VTI measures 3.46 cm. Pulmonic Valve: The pulmonic valve was normal in structure. Pulmonic valve regurgitation is not visualized. No evidence of pulmonic stenosis. Aorta: The aortic root is normal in size and structure. Venous: The inferior vena cava is normal in size with greater than 50% respiratory variability, suggesting right atrial pressure of 3 mmHg. IAS/Shunts: No atrial level shunt detected by color flow Doppler.  LEFT VENTRICLE PLAX 2D LVIDd:         4.08 cm  Diastology LVIDs:         2.78 cm  LV e' lateral:   6.31 cm/s LV PW:         1.44 cm  LV E/e' lateral: 11.9 LV IVS:        1.58 cm  LV e' medial:    5.33 cm/s LVOT diam:     2.10 cm  LV E/e' medial:  14.1 LV SV:         147 LV SV Index:   77 LVOT Area:     3.46 cm  RIGHT VENTRICLE RV S prime:     15.90 cm/s TAPSE (M-mode): 3.0 cm LEFT ATRIUM             Index LA diam:        4.00 cm 2.09 cm/m LA Vol (A2C):   52.9 ml 27.66 ml/m LA Vol (A4C):   51.4 ml 26.88 ml/m LA Biplane Vol: 54.7 ml 28.61 ml/m  AORTIC VALVE AV Area (Vmax):    3.12 cm AV Area (Vmean):   3.24 cm AV Area (VTI):     3.46 cm AV Vmax:           184.00 cm/s AV Vmean:          138.000 cm/s AV VTI:            0.425 m AV Peak Grad:      13.5 mmHg AV Mean Grad:      8.0 mmHg LVOT Vmax:         166.00 cm/s LVOT Vmean:        129.000 cm/s LVOT VTI:          0.425 m LVOT/AV VTI ratio: 1.00  AORTA Ao Root diam: 3.10 cm MITRAL VALVE               TRICUSPID VALVE MV Area (PHT): 2.93 cm    TR Peak grad:   19.4 mmHg MV E velocity: 75.20 cm/s  TR Vmax:        220.00 cm/s MV  A velocity: 94.60 cm/s MV E/A ratio:  0.79         SHUNTS                            Systemic VTI:  0.42 m                            Systemic Diam: 2.10 cm Ida Rogue MD Electronically signed by Ida Rogue MD Signature Date/Time: 11/18/2019/1:18:29 PM    Final     (Echo, Carotid, EGD, Colonoscopy, ERCP)    Subjective: Seen and examined the day of discharge Dizziness improved Blood pressure control improved  Discharge Exam: Vitals:   11/18/19 1312 11/18/19 1341  BP: (!) 145/64   Pulse: 62   Resp: 18   Temp: 98.4 F (36.9 C)   SpO2: 98% 93%   Vitals:   11/18/19 0623 11/18/19 0810 11/18/19 1312 11/18/19 1341  BP: (!) 177/63 (!) 171/52 (!) 145/64   Pulse: 78 75 62   Resp: 18 20 18    Temp: 98.1 F (36.7 C) 98.7 F (37.1 C) 98.4 F (36.9 C)   TempSrc:  Oral Oral   SpO2: 91% 93% 98% 93%  Weight:      Height:        General: Pt is alert, awake, not in acute distress Cardiovascular: RRR, S1/S2 +, no rubs, no gallops Respiratory: CTA bilaterally, no wheezing, no rhonchi Abdominal: Soft, NT, ND, bowel sounds + Extremities: no edema, no cyanosis    The results of significant diagnostics from this hospitalization (including imaging, microbiology, ancillary and laboratory) are listed below for reference.     Microbiology: Recent Results (from the past 240 hour(s))  Respiratory Panel by RT PCR (Flu A&Mary, Covid) - Nasopharyngeal Swab     Status: None   Collection Time: 11/16/19 11:32 AM   Specimen: Nasopharyngeal Swab  Result Value Ref Range Status   SARS Coronavirus 2 by RT PCR NEGATIVE NEGATIVE Final    Comment: (NOTE) SARS-CoV-2 target nucleic acids are NOT DETECTED. The SARS-CoV-2 RNA is generally detectable in upper respiratoy specimens during the acute phase of infection. The lowest concentration of SARS-CoV-2 viral copies this assay can detect is 131 copies/mL. A negative result does not preclude SARS-Cov-2 infection and should not be used as the sole basis for treatment or other patient management  decisions. A negative result may occur with  improper specimen collection/handling, submission of specimen other than nasopharyngeal swab, presence of viral mutation(s) within the areas targeted by this assay, and inadequate number of viral copies (<131 copies/mL). A negative result must be combined with clinical observations, patient history, and epidemiological information. The expected result is Negative. Fact Sheet for Patients:  PinkCheek.be Fact Sheet for Healthcare Providers:  GravelBags.it This test is not yet ap proved or cleared by the [place] FDA and  has been authorized for detection and/or diagnosis of SARS-CoV-2 by FDA under an Emergency Use Authorization (EUA). This EUA will remain  in effect (meaning this test can be used) for the duration of the COVID-19 declaration under Section 564(Mary)(1) of the Act, 21 U.S.C. section 360bbb-3(Mary)(1), unless the authorization is terminated or revoked sooner.    Influenza A by PCR NEGATIVE NEGATIVE Final   Influenza Mary by PCR NEGATIVE NEGATIVE Final    Comment: (NOTE) The Xpert Xpress SARS-CoV-2/FLU/RSV assay is intended as an aid in  the diagnosis of influenza from Nasopharyngeal swab  specimens and  should not be used as a sole basis for treatment. Nasal washings and  aspirates are unacceptable for Xpert Xpress SARS-CoV-2/FLU/RSV  testing. Fact Sheet for Patients: PinkCheek.be Fact Sheet for Healthcare Providers: GravelBags.it This test is not yet approved or cleared by the [place] FDA and  has been authorized for detection and/or diagnosis of SARS-CoV-2 by  FDA under an Emergency Use Authorization (EUA). This EUA will remain  in effect (meaning this test can be used) for the duration of the  Covid-19 declaration under Section 564(Mary)(1) of the Act, 21  U.S.C. section 360bbb-3(Mary)(1), unless the authorization  is  terminated or revoked. Performed at [facility], [address] [place], [place] [address]      Labs: BNP (last 3 results) Recent Labs    11/16/19 0833  BNP [id number]   Basic Metabolic Panel: Recent Labs  Lab 11/16/19 0833  NA 141  K 3.6  CL 102  CO2 31  GLUCOSE 150*  BUN 11  CREATININE 0.61  CALCIUM 9.2   Liver Function Tests: No results for input(s): AST, ALT, ALKPHOS, BILITOT, PROT, ALBUMIN in the last 168 hours. No results for input(s): LIPASE, AMYLASE in the last 168 hours. No results for input(s): AMMONIA in the last 168 hours. CBC: Recent Labs  Lab 11/16/19 0833  WBC 5.1  NEUTROABS 3.4  HGB 10.6*  HCT 36.4  MCV 87.7  PLT 202   Cardiac Enzymes: No results for input(s): CKTOTAL, CKMB, CKMBINDEX, TROPONINI in the last 168 hours. BNP: Invalid input(s): POCBNP CBG: Recent Labs  Lab 11/17/19 1146 11/17/19 1651 11/17/19 2108 11/18/19 0744 11/18/19 1147  GLUCAP 117* 131* 155* 127* 114*   D-Dimer No results for input(s): DDIMER in the last 72 hours. Hgb A1c No results for input(s): HGBA1C in the last 72 hours. Lipid Profile No results for input(s): CHOL, HDL, LDLCALC, TRIG, CHOLHDL, LDLDIRECT in the last 72 hours. Thyroid function studies No results for input(s): TSH, T4TOTAL, T3FREE, THYROIDAB in the last 72 hours.  Invalid input(s): FREET3 Anemia work up No results for input(s): VITAMINB12, FOLATE, FERRITIN, TIBC, IRON, RETICCTPCT in the last 72 hours. Urinalysis    Component Value Date/Time   COLORURINE YELLOW (A) 09/29/2018 0108   APPEARANCEUR Clear 02/19/2019 1450   LABSPEC 1.027 09/29/2018 0108   LABSPEC 1.005 12/29/2013 0119   PHURINE 5.0 09/29/2018 0108   GLUCOSEU Negative 02/19/2019 1450   GLUCOSEU Negative 12/29/2013 0119   HGBUR NEGATIVE 09/29/2018 0108   BILIRUBINUR Negative 02/19/2019 1450   BILIRUBINUR Negative 12/29/2013 0119   KETONESUR 5 (A) 09/29/2018 0108   PROTEINUR Negative 02/19/2019 1450   PROTEINUR 30  (A) 09/29/2018 0108   NITRITE Negative 02/19/2019 1450   NITRITE NEGATIVE 09/29/2018 0108   LEUKOCYTESUR Negative 02/19/2019 1450   LEUKOCYTESUR 1+ 12/29/2013 0119   Sepsis Labs Invalid input(s): PROCALCITONIN,  WBC,  LACTICIDVEN Microbiology Recent Results (from the past 240 hour(s))  Respiratory Panel by RT PCR (Flu A&Mary, Covid) - Nasopharyngeal Swab     Status: None   Collection Time: 11/16/19 11:32 AM   Specimen: Nasopharyngeal Swab  Result Value Ref Range Status   SARS Coronavirus 2 by RT PCR NEGATIVE NEGATIVE Final    Comment: (NOTE) SARS-CoV-2 target nucleic acids are NOT DETECTED. The SARS-CoV-2 RNA is generally detectable in upper respiratoy specimens during the acute phase of infection. The lowest concentration of SARS-CoV-2 viral copies this assay can detect is 131 copies/mL. A negative result does not preclude SARS-Cov-2 infection and should not be  used as the sole basis for treatment or other patient management decisions. A negative result may occur with  improper specimen collection/handling, submission of specimen other than nasopharyngeal swab, presence of viral mutation(s) within the areas targeted by this assay, and inadequate number of viral copies (<131 copies/mL). A negative result must be combined with clinical observations, patient history, and epidemiological information. The expected result is Negative. Fact Sheet for Patients:  PinkCheek.be Fact Sheet for Healthcare Providers:  GravelBags.it This test is not yet ap proved or cleared by the [place] FDA and  has been authorized for detection and/or diagnosis of SARS-CoV-2 by FDA under an Emergency Use Authorization (EUA). This EUA will remain  in effect (meaning this test can be used) for the duration of the COVID-19 declaration under Section 564(Mary)(1) of the Act, 21 U.S.C. section 360bbb-3(Mary)(1), unless the authorization is terminated  or revoked sooner.    Influenza A by PCR NEGATIVE NEGATIVE Final   Influenza Mary by PCR NEGATIVE NEGATIVE Final    Comment: (NOTE) The Xpert Xpress SARS-CoV-2/FLU/RSV assay is intended as an aid in  the diagnosis of influenza from Nasopharyngeal swab specimens and  should not be used as a sole basis for treatment. Nasal washings and  aspirates are unacceptable for Xpert Xpress SARS-CoV-2/FLU/RSV  testing. Fact Sheet for Patients: PinkCheek.be Fact Sheet for Healthcare Providers: GravelBags.it This test is not yet approved or cleared by the [place] FDA and  has been authorized for detection and/or diagnosis of SARS-CoV-2 by  FDA under an Emergency Use Authorization (EUA). This EUA will remain  in effect (meaning this test can be used) for the duration of the  Covid-19 declaration under Section 564(Mary)(1) of the Act, 21  U.S.C. section 360bbb-3(Mary)(1), unless the authorization is  terminated or revoked. Performed at [facility], [address] [place], [place] [address]      Time coordinating discharge: Over 30 minutes  SIGNED:   Sidney Ace, MD  [facility] 11/18/2019, 2:48 PM Pager   If 7PM-7AM, please contact night-coverage

## 2019-11-18 NOTE — Care Management CC44 (Signed)
Condition Code 44 Documentation Completed  Patient Details  Name: Mary [name] MRN: [id number] Date of Birth: [birth year]   Condition Code 44 given:  Yes Patient signature on Condition Code 44 notice:  Yes Documentation of 2 MD's agreement:  Yes Code 44 added to claim:  Yes    Gerrianne Scale Soraya Paquette, LCSW 11/18/2019, 3:15 PM

## 2019-11-18 NOTE — TOC Initial Note (Signed)
Transition of Care [facility]) - Initial/Assessment Note    Patient Details  Name: Mary [name] MRN: [id number] Date of Birth: 05-28-1935  Transition of Care [facility]) CM/SW Contact:    Eileen Stanford, LCSW Phone Number: 11/18/2019, 3:27 PM  Clinical Narrative:  CSW spoke with pt at bedside. Pt lives with her daughter who is currently receiving home health. Pt is agreeable to [facility]. Pt has used [facility] in the past and is agreeable to use them again. CSW to arrange. Pt d/c today.                 Expected Discharge Plan: [place] Barriers to Discharge: No Barriers Identified   Patient Goals and CMS Choice        Expected Discharge Plan and Services Expected Discharge Plan: [place] In-house Referral: Clinical Social Work     Living arrangements for the past 2 months: [place] Expected Discharge Date: 11/18/19                 DME Agency: NA       HH Arranged: PT [place] Agency: [facility] [facility] [employer] Date [place]: 11/18/19 Time [place]: 75 Representative spoke with at [place]: [place] Arrangements/Services Living arrangements for the past 2 months: [place] Lives with:: Adult Children Patient language and need for interpreter reviewed:: No Do you feel safe going back to the place where you live?: Yes      Need for Family Participation in Patient Care: Yes (Comment) Care giver support system in place?: Yes (comment)   Criminal Activity/Legal Involvement Pertinent to Current Situation/Hospitalization: No - Comment as needed  Activities of Daily Living Home Assistive Devices/Equipment: Cane (specify quad or straight), Eyeglasses, Walker (specify type), CBG Meter, Blood pressure cuff ADL Screening (condition at time of admission) Patient's cognitive ability adequate to safely complete daily activities?: Yes Is the patient deaf or have difficulty hearing?: No Does  the patient have difficulty seeing, even when wearing glasses/contacts?: No Does the patient have difficulty concentrating, remembering, or making decisions?: No Patient able to express need for assistance with ADLs?: Yes Does the patient have difficulty dressing or bathing?: No Independently performs ADLs?: Yes (appropriate for developmental age) Does the patient have difficulty walking or climbing stairs?: Yes Weakness of Legs: Left(broken hip/knee) Weakness of Arms/Hands: None(numbness in hands and feet)  Permission Sought/Granted Permission sought to share information with : Family Supports Permission granted to share information with : Yes, Verbal Permission Granted  Share Information with NAME: Olin Hauser  Permission granted to share info w AGENCY: [facility] granted to share info w Relationship: daughter  Permission granted to share info w Contact Information: [phone]  Emotional Assessment Appearance:: Appears stated age Attitude/Demeanor/Rapport: Engaged Affect (typically observed): Accepting Orientation: : Oriented to Self, Oriented to Place, Oriented to  Time, Oriented to Situation Alcohol / Substance Use: Not Applicable Psych Involvement: No (comment)  Admission diagnosis:  Hypoxia [R09.02] Acute respiratory failure with hypoxia (HCC) [J96.01] Generalized weakness [R53.1] Hypertensive urgency [I16.0] Patient Active Problem List   Diagnosis Date Noted  . Asthma   . Acute respiratory failure with hypoxia ([place])   . UTI (urinary tract infection) 02/18/2019  . Carotid stenosis 01/18/2019  . History of fracture of left hip 10/26/2018  . Hip fracture ([place]) 09/28/2018  . Dizziness 09/28/2018  . History of normocytic normochromic anemia 06/13/2018  . Status post total knee  replacement using cement, left 12/01/2017  . Synovial cyst of left popliteal space 05/27/2017  . Hypertensive urgency 05/19/2017  . Diabetic polyneuropathy associated with type 2 diabetes  mellitus ([place]) 03/30/2017  . Anxiety 05/19/2016  . Insomnia 05/19/2016  . Pure hypercholesterolemia 05/19/2016  . Personal history of disease 03/11/2016  . Status post hip hemiarthroplasty 10/10/2015  . Hypoxia 09/26/2015  . Hypoxemia 09/26/2015  . Fracture of femoral neck, right ([place]) 09/23/2015  . Diabetes mellitus without complication ([place]) [id number]  . HTN (hypertension) 09/23/2015  . HLD (hyperlipidemia) 09/23/2015  . GERD (gastroesophageal reflux disease) 09/23/2015  . Depression 09/23/2015  . Hypothyroidism 09/23/2015  . DDD (degenerative disc disease), lumbar 08/26/2015  . Lumbar stenosis with neurogenic claudication 08/26/2015  . Nocturia 04/29/2015  . Urinary frequency 04/29/2015  . Atrophic vaginitis 04/29/2015  . Mixed incontinence 06/28/2014  . Lumbar radiculopathy 02/08/2013   PCP:  Dion Body, MD Pharmacy:   [place], [place] [address] [place] [address] Phone: [phone] Fax: [phone]     Social Determinants of Health (SDOH) Interventions    Readmission Risk Interventions No flowsheet data found.

## 2019-11-18 NOTE — Progress Notes (Signed)
Discharge instructions and med details reviewed with patient. Pt verbalized understanding. Printed AVS given to patient. Pt escorted out via wheelchair.

## 2019-11-18 NOTE — Care Management Obs Status (Signed)
[place] NOTIFICATION   Patient Details  Name: Mary [name] MRN: [id number] Date of Birth: 18-Apr-1935   Medicare Observation Status Notification Given:  Yes    Gerrianne Scale Randal Yepiz, LCSW 11/18/2019, 3:15 PM

## 2020-02-19 ENCOUNTER — Encounter: Payer: Self-pay | Admitting: Intensive Care

## 2020-02-19 ENCOUNTER — Other Ambulatory Visit: Payer: Self-pay

## 2020-02-19 ENCOUNTER — Emergency Department: Payer: Medicare PPO

## 2020-02-19 ENCOUNTER — Emergency Department
Admission: EM | Admit: 2020-02-19 | Discharge: 2020-02-19 | Disposition: A | Discharge status: Home / Self Care | Payer: Medicare PPO | Attending: Emergency Medicine | Admitting: Emergency Medicine

## 2020-02-19 DIAGNOSIS — N390 Urinary tract infection, site not specified: Secondary | ICD-10-CM | POA: Insufficient documentation

## 2020-02-19 DIAGNOSIS — I10 Essential (primary) hypertension: Secondary | ICD-10-CM | POA: Insufficient documentation

## 2020-02-19 DIAGNOSIS — R42 Dizziness and giddiness: Secondary | ICD-10-CM | POA: Diagnosis not present

## 2020-02-19 DIAGNOSIS — J45909 Unspecified asthma, uncomplicated: Secondary | ICD-10-CM | POA: Insufficient documentation

## 2020-02-19 LAB — BASIC METABOLIC PANEL
Anion gap: 11 (ref 5–15)
BUN: 11 mg/dL (ref 8–23)
CO2: 29 mmol/L (ref 22–32)
Calcium: 9.7 mg/dL (ref 8.9–10.3)
Chloride: 100 mmol/L (ref 98–111)
Creatinine, Ser: 0.59 mg/dL (ref 0.44–1.00)
GFR calc Af Amer: >60 mL/min (ref >60)
GFR calc non Af Amer: >60 mL/min (ref >60)
Glucose, Bld: 104 mg/dL — ABNORMAL HIGH (ref 70–99)
Potassium: 3.7 mmol/L (ref 3.5–5.1)
Sodium: 140 mmol/L (ref 135–145)

## 2020-02-19 LAB — CBC
HCT: 37.9 % (ref 36.0–46.0)
Hemoglobin: 11 g/dL — ABNORMAL LOW (ref 12.0–15.0)
MCH: 25.1 pg — ABNORMAL LOW (ref 26.0–34.0)
MCHC: 29 g/dL — ABNORMAL LOW (ref 30.0–36.0)
MCV: 86.5 fL (ref 80.0–100.0)
Platelets: 221 10*3/uL (ref 150–400)
RBC: 4.38 MIL/uL (ref 3.87–5.11)
RDW: 17 % — ABNORMAL HIGH (ref 11.5–15.5)
WBC: 5.4 10*3/uL (ref 4.0–10.5)
nRBC: 0 % (ref 0.0–0.2)

## 2020-02-19 LAB — URINALYSIS, COMPLETE (UACMP) WITH MICROSCOPIC
Bilirubin Urine: NEGATIVE
Glucose, UA: NEGATIVE mg/dL
Hgb urine dipstick: NEGATIVE
Ketones, ur: NEGATIVE mg/dL
Nitrite: NEGATIVE
Protein, ur: NEGATIVE mg/dL
Specific Gravity, Urine: 1.008 (ref 1.005–1.030)
pH: 7 (ref 5.0–8.0)

## 2020-02-19 MED ORDER — CIPROFLOXACIN HCL 250 MG PO TABS
250.0000 mg | ORAL_TABLET | Freq: Two times a day (BID) | ORAL | 0 refills | Status: AC
Start: 2020-02-19 — End: 2020-02-26

## 2020-02-19 MED ORDER — PHENAZOPYRIDINE HCL 200 MG PO TABS
200.0000 mg | ORAL_TABLET | Freq: Once | ORAL | Status: AC
  Administered 2020-02-19: 200 mg via ORAL
  Filled 2020-02-19: qty 1

## 2020-02-19 MED ORDER — CIPROFLOXACIN HCL 500 MG PO TABS
250.0000 mg | ORAL_TABLET | Freq: Once | ORAL | Status: AC
  Administered 2020-02-19: 250 mg via ORAL
  Filled 2020-02-19: qty 1

## 2020-02-19 MED ORDER — PHENAZOPYRIDINE HCL 200 MG PO TABS
200.0000 mg | ORAL_TABLET | Freq: Three times a day (TID) | ORAL | 0 refills | Status: AC | PRN
Start: 2020-02-19 — End: 2021-02-18

## 2020-02-19 NOTE — ED Provider Notes (Signed)
ER Provider Note       Time seen: 8:09 PM    I have reviewed the vital signs and the nursing notes.  HISTORY   Chief Complaint Dizziness   HPI Mary [name] is a 85 y.o. female with a history of GERD, asthma, depression, hyperlipidemia, hypertension, vertigo who presents today for dizziness.  Patient states she has had acute on chronic dizziness, recently hit her head after falling about a week ago.  Was never seen for the fall.  Also is concerned she may have a UTI.  She states she took some leftover amoxicillin she had for the UTI and had some Pyridium she could take.  Past Medical History:  Diagnosis Date  . Acid reflux   . Arthritis   . Asthma   . Depression   . Diabetes ([place])   . Dysrhythmia   . Heart murmur   . HLD (hyperlipidemia)   . HTN (hypertension)   . Hypotension    when get up too quickly  . Sleep apnea   . Urinary urgency     Past Surgical History:  Procedure Laterality Date  . ABDOMINAL HYSTERECTOMY    . CYSTOSCOPY    . HIP ARTHROPLASTY Right 09/24/2015   Procedure: ARTHROPLASTY BIPOLAR HIP (HEMIARTHROPLASTY);  Surgeon: Corky Mull, MD;  Location: [facility] ORS;  Service: Orthopedics;  Laterality: Right;  . HIP ARTHROPLASTY Left 09/29/2018   Procedure: ARTHROPLASTY BIPOLAR HIP (HEMIARTHROPLASTY) LEFT;  Surgeon: Corky Mull, MD;  Location: [facility] ORS;  Service: Orthopedics;  Laterality: Left;  . JOINT REPLACEMENT     right hip  . JOINT REPLACEMENT     right knee  . KNEE ARTHROSCOPY W/ AUTOGENOUS CARTILAGE IMPLANTATION (ACI) PROCEDURE    . TOTAL KNEE ARTHROPLASTY Left 12/01/2017   Procedure: TOTAL KNEE ARTHROPLASTY;  Surgeon: Corky Mull, MD;  Location: [facility] ORS;  Service: Orthopedics;  Laterality: Left;  Marland Kitchen VARICOSE VEIN SURGERY    . VEIN SURGERY      Allergies Cephalexin, Nitrofurantoin, Sulfa antibiotics, and Atorvastatin  Review of Systems Constitutional: Negative for fever. Cardiovascular: Negative for chest pain. Respiratory: Negative for  shortness of breath. Gastrointestinal: Negative for abdominal pain, vomiting and diarrhea. Genitourinary: Positive for dysuria Musculoskeletal: Negative for back pain. Skin: Negative for rash. Neurological: Negative for headaches, focal weakness or numbness.  Positive for dizziness  All systems negative/normal/unremarkable except as stated in the HPI  ____________________________________________   PHYSICAL EXAM:  VITAL SIGNS: Vitals:   02/19/20 1559  BP: (!) 194/67  Pulse: 61  Resp: 18  Temp: 98.1 F (36.7 C)  SpO2: 99%    Constitutional: Alert and oriented. Well appearing and in no distress. Eyes: Conjunctivae are normal. Normal extraocular movements. ENT      Head: Normocephalic and atraumatic.      Nose: No congestion/rhinnorhea.      Mouth/Throat: Mucous membranes are moist.      Neck: No stridor. Cardiovascular: Normal rate, regular rhythm. No murmurs, rubs, or gallops. Respiratory: Normal respiratory effort without tachypnea nor retractions. Breath sounds are clear and equal bilaterally. No wheezes/rales/rhonchi. Gastrointestinal: Soft and nontender. Normal bowel sounds Musculoskeletal: Nontender with normal range of motion in extremities. No lower extremity tenderness nor edema. Neurologic:  Normal speech and language. No gross focal neurologic deficits are appreciated.  Skin:  Skin is warm, dry and intact. No rash noted. Psychiatric: Speech and behavior are normal.  ____________________________________________  EKG: Interpreted by me.  Sinus bradycardia with rate of 59 bpm, normal axis, normal QT  ____________________________________________   LABS (pertinent positives/negatives)  Labs Reviewed  BASIC METABOLIC PANEL - Abnormal; Notable for the following components:      Result Value   Glucose, Bld 104 (*)    All other components within normal limits  CBC - Abnormal; Notable for the following components:   Hemoglobin 11.0 (*)    MCH 25.1 (*)    MCHC 29.0  (*)    RDW 17.0 (*)    All other components within normal limits  URINALYSIS, COMPLETE (UACMP) WITH MICROSCOPIC - Abnormal; Notable for the following components:   Color, Urine YELLOW (*)    APPearance CLEAR (*)    Leukocytes,Ua TRACE (*)    Bacteria, UA RARE (*)    All other components within normal limits    RADIOLOGY  CT head IMPRESSION: Mild diffuse cortical atrophy. Mild chronic ischemic white matter disease. No acute intracranial abnormality seen.  DIFFERENTIAL DIAGNOSIS  Dizziness, dehydration, electrolyte abnormality, vertigo, occult infection, UTI  ASSESSMENT AND PLAN  Dizziness, UTI   Plan: The patient had presented for acute on chronic dizziness. Patient's labs have not revealed any significant acute process other than UTI.  She was given a dose of Cipro due to her allergies and Pyridium.  She appears well and neurologically intact.  She is cleared for outpatient follow-up with her doctor.  Lenise Arena MD    Note: This note was generated in part or whole with voice recognition software. Voice recognition is usually quite accurate but there are transcription errors that can and very often do occur. I apologize for any typographical errors that were not detected and corrected.     Earleen Newport, MD 02/19/20 2012

## 2020-02-19 NOTE — ED Triage Notes (Signed)
Patient reports falling and hitting head around a week ago. Denies LOC. Was never seen for fall. Reports pain in neck and headaches ever since fall. HX vertigo. Patient also believes she might have a UTI

## 2020-03-19 DIAGNOSIS — I700 Atherosclerosis of aorta: Secondary | ICD-10-CM | POA: Insufficient documentation

## 2020-03-19 DIAGNOSIS — R0602 Shortness of breath: Secondary | ICD-10-CM | POA: Insufficient documentation

## 2020-03-19 DIAGNOSIS — I2510 Atherosclerotic heart disease of native coronary artery without angina pectoris: Secondary | ICD-10-CM | POA: Insufficient documentation

## 2020-11-18 ENCOUNTER — Inpatient Hospital Stay: Payer: Medicare PPO

## 2020-11-18 ENCOUNTER — Inpatient Hospital Stay: Payer: Medicare PPO | Attending: Oncology | Admitting: Oncology

## 2020-11-18 ENCOUNTER — Encounter: Payer: Self-pay | Admitting: Oncology

## 2020-11-18 ENCOUNTER — Encounter (INDEPENDENT_AMBULATORY_CARE_PROVIDER_SITE_OTHER): Payer: Self-pay

## 2020-11-18 VITALS — BP 188/96 | HR 62 | Temp 98.2°F | Resp 18 | Wt 174.0 lb

## 2020-11-18 DIAGNOSIS — E119 Type 2 diabetes mellitus without complications: Secondary | ICD-10-CM | POA: Diagnosis not present

## 2020-11-18 DIAGNOSIS — Z7984 Long term (current) use of oral hypoglycemic drugs: Secondary | ICD-10-CM | POA: Insufficient documentation

## 2020-11-18 DIAGNOSIS — D649 Anemia, unspecified: Secondary | ICD-10-CM

## 2020-11-18 DIAGNOSIS — I10 Essential (primary) hypertension: Secondary | ICD-10-CM | POA: Diagnosis not present

## 2020-11-18 DIAGNOSIS — D509 Iron deficiency anemia, unspecified: Secondary | ICD-10-CM

## 2020-11-18 DIAGNOSIS — Z7982 Long term (current) use of aspirin: Secondary | ICD-10-CM | POA: Insufficient documentation

## 2020-11-18 DIAGNOSIS — Z79899 Other long term (current) drug therapy: Secondary | ICD-10-CM | POA: Diagnosis not present

## 2020-11-18 DIAGNOSIS — Z8249 Family history of ischemic heart disease and other diseases of the circulatory system: Secondary | ICD-10-CM | POA: Diagnosis not present

## 2020-11-18 LAB — CBC WITH DIFFERENTIAL/PLATELET
Abs Immature Granulocytes: 0.02 10*3/uL (ref 0.00–0.07)
Basophils Absolute: 0 10*3/uL (ref 0.0–0.1)
Basophils Relative: 1 %
Eosinophils Absolute: 0.2 10*3/uL (ref 0.0–0.5)
Eosinophils Relative: 4 %
HCT: 35.4 % — ABNORMAL LOW (ref 36.0–46.0)
Hemoglobin: 10.2 g/dL — ABNORMAL LOW (ref 12.0–15.0)
Immature Granulocytes: 0 %
Lymphocytes Relative: 27 %
Lymphs Abs: 1.4 10*3/uL (ref 0.7–4.0)
MCH: 25.2 pg — ABNORMAL LOW (ref 26.0–34.0)
MCHC: 28.8 g/dL — ABNORMAL LOW (ref 30.0–36.0)
MCV: 87.6 fL (ref 80.0–100.0)
Monocytes Absolute: 0.5 10*3/uL (ref 0.1–1.0)
Monocytes Relative: 9 %
Neutro Abs: 3.1 10*3/uL (ref 1.7–7.7)
Neutrophils Relative %: 59 %
Platelets: 178 10*3/uL (ref 150–400)
RBC: 4.04 MIL/uL (ref 3.87–5.11)
RDW: 17.2 % — ABNORMAL HIGH (ref 11.5–15.5)
WBC: 5.3 10*3/uL (ref 4.0–10.5)
nRBC: 0 % (ref 0.0–0.2)

## 2020-11-18 LAB — TECHNOLOGIST SMEAR REVIEW
Plt Morphology: NORMAL
RBC Morphology: NORMAL

## 2020-11-18 LAB — VITAMIN B12: Vitamin B-12: 257 pg/mL (ref 180–914)

## 2020-11-18 LAB — IRON AND TIBC
Iron: 23 ug/dL — ABNORMAL LOW (ref 28–170)
Saturation Ratios: 5 % — ABNORMAL LOW (ref 10.4–31.8)
TIBC: 476 ug/dL — ABNORMAL HIGH (ref 250–450)
UIBC: 453 ug/dL

## 2020-11-18 LAB — FOLATE: Folate: 33 ng/mL (ref >5.9)

## 2020-11-18 LAB — RETIC PANEL
Immature Retic Fract: 19.8 % — ABNORMAL HIGH (ref 2.3–15.9)
RBC.: 4 MIL/uL (ref 3.87–5.11)
Retic Count, Absolute: 58.8 10*3/uL (ref 19.0–186.0)
Retic Ct Pct: 1.5 % (ref 0.4–3.1)
Reticulocyte Hemoglobin: 26.6 pg — ABNORMAL LOW (ref >27.9)

## 2020-11-18 LAB — FERRITIN: Ferritin: 6 ng/mL — ABNORMAL LOW (ref 11–307)

## 2020-11-18 MED ORDER — VITRON-C 65-125 MG PO TABS: 1.0000 | ORAL_TABLET | Freq: Every day | ORAL | 1 refills | Status: DC

## 2020-11-18 NOTE — Progress Notes (Signed)
Hematology/Oncology Consult note [facility] Telephone:([phone] [phone]   Patient Care Team: Dion Body, MD as PCP - General (Family Medicine)  REFERRING PROVIDER: Dion Body, MD  CHIEF COMPLAINTS/REASON FOR VISIT:  Evaluation of anemia  HISTORY OF PRESENTING ILLNESS:  Mary [name] is a  86 y.o.  female with PMH listed below who was referred to me for evaluation of anemia Reviewed patient's recent labs that was done.  11/11/2020 labs revealed anemia with hemoglobin of 10.3, MCV 88.8, WBC 5.1,.   Reviewed patient's previous labs ordered by primary care physician's office, anemia is chronic onset , duration is since at least 2017. No aggravating or improving factors.  Associated signs and symptoms: Patient reports chronic fatigue.  Denies SOB with exertion.  Denies weight loss, easy bruising, hematochezia, hemoptysis, hematuria. Context: History of GI bleeding: Denies               History of Chronic kidney disease: She has normal creatinine at baseline.               History of autoimmune disease: Denies               Last colonoscopy: Unknown.  Not in EMR.    Review of Systems  Constitutional: Positive for fatigue. Negative for appetite change, chills and fever.  HENT:   Negative for hearing loss and voice change.   Eyes: Negative for eye problems.  Respiratory: Negative for chest tightness and cough.   Cardiovascular: Negative for chest pain.  Gastrointestinal: Negative for abdominal distention, abdominal pain and blood in stool.  Endocrine: Negative for hot flashes.  Genitourinary: Negative for difficulty urinating and frequency.   Musculoskeletal: Negative for arthralgias.  Skin: Negative for itching and rash.  Neurological: Negative for extremity weakness.  Hematological: Negative for adenopathy.  Psychiatric/Behavioral: Negative for confusion.     MEDICAL HISTORY:  Past Medical History:  Diagnosis Date  .  Acid reflux   . Arthritis   . Asthma   . Depression   . Diabetes ([place])   . Dysrhythmia   . Heart murmur   . HLD (hyperlipidemia)   . HTN (hypertension)   . Hypotension    when get up too quickly  . Sleep apnea   . Urinary urgency     SURGICAL HISTORY: Past Surgical History:  Procedure Laterality Date  . ABDOMINAL HYSTERECTOMY    . CYSTOSCOPY    . HIP ARTHROPLASTY Right 09/24/2015   Procedure: ARTHROPLASTY BIPOLAR HIP (HEMIARTHROPLASTY);  Surgeon: Corky Mull, MD;  Location: [facility] ORS;  Service: Orthopedics;  Laterality: Right;  . HIP ARTHROPLASTY Left 09/29/2018   Procedure: ARTHROPLASTY BIPOLAR HIP (HEMIARTHROPLASTY) LEFT;  Surgeon: Corky Mull, MD;  Location: [facility] ORS;  Service: Orthopedics;  Laterality: Left;  . JOINT REPLACEMENT     right hip  . JOINT REPLACEMENT     right knee  . KNEE ARTHROSCOPY W/ AUTOGENOUS CARTILAGE IMPLANTATION (ACI) PROCEDURE    . TOTAL KNEE ARTHROPLASTY Left 12/01/2017   Procedure: TOTAL KNEE ARTHROPLASTY;  Surgeon: Corky Mull, MD;  Location: [facility] ORS;  Service: Orthopedics;  Laterality: Left;  Marland Kitchen VARICOSE VEIN SURGERY    . VEIN SURGERY      SOCIAL HISTORY: Social History   Socioeconomic History  . Marital status: Widowed    Spouse name: Not on file  . Number of children: Not on file  . Years of education: Not on file  . Highest education level: Not on file  Occupational History  .  Occupation: retired  Tobacco Use  . Smoking status: Never Smoker  . Smokeless tobacco: Never Used  Vaping Use  . Vaping Use: Never used  Substance and Sexual Activity  . Alcohol use: No    Alcohol/week: 0.0 standard drinks  . Drug use: No  . Sexual activity: Not Currently  Other Topics Concern  . Not on file  Social History Narrative  . Not on file   Social Determinants of Health   Financial Resource Strain: Not on file  Food Insecurity: Not on file  Transportation Needs: Not on file  Physical Activity: Not on file  Stress: Not on file  Social  Connections: Not on file  Intimate Partner Violence: Not on file    FAMILY HISTORY: Family History  Problem Relation Age of Onset  . Kidney disease Brother        also nephew  . Heart disease Mother   . Heart disease Father   . Prostate cancer Neg Hx   . Bladder Cancer Neg Hx   . Breast cancer Neg Hx   . Kidney cancer Neg Hx     ALLERGIES:  is allergic to cephalexin, nitrofurantoin, sulfa antibiotics, and atorvastatin.  MEDICATIONS:  Current Outpatient Medications  Medication Sig Dispense Refill  . acetaminophen (TYLENOL) 500 MG tablet Take 500 mg by mouth every 6 (six) hours as needed.    Marland Kitchen amLODipine (NORVASC) 10 MG tablet Take 10 mg by mouth daily at 2 PM.     . aspirin EC 81 MG tablet Take 81 mg by mouth daily.    Marland Kitchen azelastine (OPTIVAR) 0.05 % ophthalmic solution Apply to eye. Place 1 drop into both eyes 2 (two) times daily as needed    . busPIRone (BUSPAR) 10 MG tablet Take 20 mg by mouth 2 (two) times daily.     . carvedilol (COREG) 3.125 MG tablet Take 3.125 mg by mouth every other day.    . clindamycin (CLEOCIN) 300 MG capsule Take 300 mg by mouth 2 (two) times daily.    Marland Kitchen ezetimibe (ZETIA) 10 MG tablet Take 10 mg by mouth daily.    . hydrALAZINE (APRESOLINE) 100 MG tablet Take 1 tablet (100 mg total) by mouth 3 (three) times daily. 90 tablet 0  . hydrochlorothiazide (HYDRODIURIL) 25 MG tablet Take 1 tablet (25 mg total) by mouth daily. 30 tablet 0  . levothyroxine (SYNTHROID, LEVOTHROID) 75 MCG tablet Take 75 mcg by mouth daily.     Marland Kitchen losartan (COZAAR) 100 MG tablet Take 100 mg by mouth daily.     . meclizine (ANTIVERT) 12.5 MG tablet Take 1 tablet (12.5 mg total) by mouth 3 (three) times daily as needed for dizziness. 30 tablet 0  . metFORMIN (GLUCOPHAGE-XR) 500 MG 24 hr tablet Take 500 mg by mouth daily.     Marland Kitchen omeprazole (PRILOSEC) 40 MG capsule Take 40 mg by mouth daily.     Marland Kitchen PARoxetine (PAXIL) 40 MG tablet Take 40 mg by mouth every evening.     . traMADol (ULTRAM)  50 MG tablet Take 1 tablet (50 mg total) by mouth every 6 (six) hours as needed for moderate pain. 20 tablet 0  . traZODone (DESYREL) 50 MG tablet Take by mouth. Take 1 tablet (50 mg total) by mouth nightly as needed for Sleep 08/26/2020  [place] 11/11/2020                AMLODIPINE BESYLATE (CALCIUM CHANNEL BLOCKERS)    . vitamin B-12 (CYANOCOBALAMIN) 500  MCG tablet Take 500 mcg by mouth daily.    . vitamin E 400 UNIT capsule Take 400 Units by mouth 4 (four) times a week.     . phenazopyridine (PYRIDIUM) 200 MG tablet Take 1 tablet (200 mg total) by mouth 3 (three) times daily as needed for pain. (Patient not taking: Reported on 11/18/2020) 12 tablet 0   No current facility-administered medications for this visit.     PHYSICAL EXAMINATION: ECOG PERFORMANCE STATUS: 2 - Symptomatic, <50% confined to bed Vitals:   11/18/20 1453  BP: (!) 188/96  Pulse: 62  Resp: 18  Temp: 98.2 F (36.8 C)   Filed Weights   11/18/20 1453  Weight: 174 lb (78.9 kg)    Physical Exam Constitutional:      General: She is not in acute distress.    Comments: Patient sits in the wheelchair  HENT:     Head: Normocephalic and atraumatic.  Eyes:     General: No scleral icterus. Cardiovascular:     Rate and Rhythm: Normal rate and regular rhythm.     Heart sounds: Normal heart sounds.  Pulmonary:     Effort: Pulmonary effort is normal. No respiratory distress.     Breath sounds: No wheezing.  Abdominal:     General: Bowel sounds are normal. There is no distension.     Palpations: Abdomen is soft.  Musculoskeletal:        General: No deformity. Normal range of motion.     Cervical back: Normal range of motion and neck supple.  Skin:    General: Skin is warm and dry.     Findings: No erythema or rash.  Neurological:     Mental Status: She is alert and oriented to person, place, and time. Mental status is at baseline.     Cranial Nerves: No cranial nerve deficit.     Coordination:  Coordination normal.  Psychiatric:        Mood and Affect: Mood normal.      LABORATORY DATA:  I have reviewed the data as listed Lab Results  Component Value Date   WBC 5.3 11/18/2020   HGB 10.2 (L) 11/18/2020   HCT 35.4 (L) 11/18/2020   MCV 87.6 11/18/2020   PLT 178 11/18/2020   Recent Labs    02/19/20 1618  NA 140  K 3.7  CL 100  CO2 29  GLUCOSE 104*  BUN 11  CREATININE 0.59  CALCIUM 9.7  GFRNONAA >60  GFRAA >60   Iron/TIBC/Ferritin/ %Sat    Component Value Date/Time   IRON 23 (L) 11/18/2020 1555   TIBC 476 (H) 11/18/2020 1555   FERRITIN 6 (L) 11/18/2020 1555   IRONPCTSAT 5 (L) 11/18/2020 1555        ASSESSMENT & PLAN:  1. Anemia, unspecified type   2. Iron deficiency anemia, unspecified iron deficiency anemia type    Anemia: multifactorial with possible causes including chronic blood loss, hyper/hypothyroidism, nutritional deficiency, infection/chronic inflammation, hemolysis, underlying bone marrow disorders. Will check CBC w differential, CMP, vitamin B12, Folate, iron/TIBC, ferritin, reticulocytes, blood smear, , monoclonal gammopathy evaluation.    Iron deficiency anemia Iron panel shows ferritin of 6, iron saturation 5, increased TIBC 476.  Consistent with iron deficiency. I will start patient on oral iron supplementation.  Rest of her work-up is still pending.  Will discuss with patient at next visit. Orders Placed This Encounter  Procedures  . CBC with Differential/Platelet    Standing Status:   Future  Number of Occurrences:   1    Standing Expiration Date:   11/18/2021  . Technologist smear review    Standing Status:   Future    Number of Occurrences:   1    Standing Expiration Date:   11/18/2021  . Multiple Myeloma Panel (SPEP&IFE w/QIG)    Standing Status:   Future    Number of Occurrences:   1    Standing Expiration Date:   11/18/2021  . Kappa/lambda light chains    Standing Status:   Future    Number of Occurrences:   1    Standing  Expiration Date:   11/18/2021  . Vitamin B12    Standing Status:   Future    Number of Occurrences:   1    Standing Expiration Date:   11/18/2021  . Folate    Standing Status:   Future    Number of Occurrences:   1    Standing Expiration Date:   11/18/2021  . [place]pper, serum    Standing Status:   Future    Number of Occurrences:   1    Standing Expiration Date:   11/18/2021  . Retic Panel    Standing Status:   Future    Number of Occurrences:   1    Standing Expiration Date:   11/18/2021  . Iron and TIBC    Standing Status:   Future    Number of Occurrences:   1    Standing Expiration Date:   11/18/2021  . Ferritin    Standing Status:   Future    Number of Occurrences:   1    Standing Expiration Date:   11/18/2021    All questions were answered. The patient knows to call the clinic with any problems questions or concerns. Cc. Dion Body, MD  Return of visit: 2 weeks Thank you for this kind referral and the opportunity to participate in the care of this patient. A copy of today's note is routed to referring provider   Earlie Server, MD, PhD 11/18/2020

## 2020-11-18 NOTE — Progress Notes (Signed)
Patient here to establish care. BP is elevated (188/96). Pt reports that BP is usually high.

## 2020-11-19 ENCOUNTER — Telehealth: Payer: Self-pay

## 2020-11-19 LAB — KAPPA/LAMBDA LIGHT CHAINS
Kappa free light chain: 32 mg/L — ABNORMAL HIGH (ref 3.3–19.4)
Kappa, lambda light chain ratio: 1.31 (ref 0.26–1.65)
Lambda free light chains: 24.5 mg/L (ref 5.7–26.3)

## 2020-11-19 NOTE — Telephone Encounter (Signed)
-----   Message from Earlie Server, MD sent at 11/18/2020  8:52 PM EDT ----- Please let patient know that her iron levels were decreased.  I have sent a prescription to pharmacy and she can start to take..  I will discuss details about the rest of the blood work results during her next visit.

## 2020-11-19 NOTE — Telephone Encounter (Signed)
Pt informed and voiced understanding

## 2020-11-20 LAB — MULTIPLE MYELOMA PANEL, SERUM
Albumin SerPl Elph-Mcnc: 4.1 g/dL (ref 2.9–4.4)
Albumin/Glob SerPl: 1.4 (ref 0.7–1.7)
Alpha 1: 0.3 g/dL (ref 0.0–0.4)
Alpha2 Glob SerPl Elph-Mcnc: 0.8 g/dL (ref 0.4–1.0)
B-Globulin SerPl Elph-Mcnc: 1.2 g/dL (ref 0.7–1.3)
Gamma Glob SerPl Elph-Mcnc: 0.8 g/dL (ref 0.4–1.8)
Globulin, Total: 3 g/dL (ref 2.2–3.9)
IgA: 383 mg/dL (ref 64–422)
IgG (Immunoglobin G), Serum: 950 mg/dL (ref 586–1602)
IgM (Immunoglobulin M), Srm: 72 mg/dL (ref 26–217)
Total Protein ELP: 7.1 g/dL (ref 6.0–8.5)

## 2020-11-20 LAB — COPPER, SERUM: Copper: 98 ug/dL (ref 80–158)

## 2020-12-01 ENCOUNTER — Inpatient Hospital Stay: Payer: Medicare PPO | Attending: Oncology | Admitting: Oncology

## 2020-12-01 ENCOUNTER — Encounter: Payer: Self-pay | Admitting: Oncology

## 2020-12-01 VITALS — BP 158/72 | HR 61 | Temp 98.9°F | Resp 20 | Wt 173.7 lb

## 2020-12-01 DIAGNOSIS — D509 Iron deficiency anemia, unspecified: Secondary | ICD-10-CM

## 2020-12-01 NOTE — Progress Notes (Signed)
Hematology/Oncology Consult note [facility] [phone] [phone]   Patient Care Team: Dion Body, MD as PCP - General (Family Medicine)  REFERRING PROVIDER: Dion Body, MD  CHIEF COMPLAINTS/REASON FOR VISIT:  Evaluation of anemia  HISTORY OF PRESENTING ILLNESS:  Mary [name] is a  86 y.o.  female with PMH listed below who was referred to me for evaluation of anemia Reviewed patient's recent labs that was done.  11/11/2020 labs revealed anemia with hemoglobin of 10.3, MCV 88.8, WBC 5.1,.   Reviewed patient's previous labs ordered by primary care physician's office, anemia is chronic onset , duration is since at least 2017. No aggravating or improving factors.  Associated signs and symptoms: Patient reports chronic fatigue.  Denies SOB with exertion.  Denies weight loss, easy bruising, hematochezia, hemoptysis, hematuria. Context: History of GI bleeding: Denies               History of Chronic kidney disease: She has normal creatinine at baseline.               History of autoimmune disease: Denies               Last colonoscopy:  more than 10 years ago not in EMR.   INTERVAL HISTORY Mary [name] is a 86 y.o. female who has above history reviewed by me today presents for follow up visit for management of iron deficiency anemia Problems and complaints are listed below:  Patient reports no new complaints.  She has started on Vitron-C daily and tolerated well.  Review of Systems  Constitutional: Positive for fatigue. Negative for appetite change, chills and fever.  HENT:   Negative for hearing loss and voice change.   Eyes: Negative for eye problems.  Respiratory: Negative for chest tightness and cough.   Cardiovascular: Negative for chest pain.  Gastrointestinal: Negative for abdominal distention, abdominal pain and blood in stool.  Endocrine: Negative for hot flashes.  Genitourinary: Negative for difficulty  urinating and frequency.   Musculoskeletal: Negative for arthralgias.  Skin: Negative for itching and rash.  Neurological: Negative for extremity weakness.  Hematological: Negative for adenopathy.  Psychiatric/Behavioral: Negative for confusion.     MEDICAL HISTORY:  Past Medical History:  Diagnosis Date  . Acid reflux   . Arthritis   . Asthma   . Depression   . Diabetes ([place])   . Dysrhythmia   . Heart murmur   . HLD (hyperlipidemia)   . HTN (hypertension)   . Hypotension    when get up too quickly  . Sleep apnea   . Urinary urgency     SURGICAL HISTORY: Past Surgical History:  Procedure Laterality Date  . ABDOMINAL HYSTERECTOMY    . CYSTOSCOPY    . HIP ARTHROPLASTY Right 09/24/2015   Procedure: ARTHROPLASTY BIPOLAR HIP (HEMIARTHROPLASTY);  Surgeon: Corky Mull, MD;  Location: [facility] ORS;  Service: Orthopedics;  Laterality: Right;  . HIP ARTHROPLASTY Left 09/29/2018   Procedure: ARTHROPLASTY BIPOLAR HIP (HEMIARTHROPLASTY) LEFT;  Surgeon: Corky Mull, MD;  Location: [facility] ORS;  Service: Orthopedics;  Laterality: Left;  . JOINT REPLACEMENT     right hip  . JOINT REPLACEMENT     right knee  . KNEE ARTHROSCOPY W/ AUTOGENOUS CARTILAGE IMPLANTATION (ACI) PROCEDURE    . TOTAL KNEE ARTHROPLASTY Left 12/01/2017   Procedure: TOTAL KNEE ARTHROPLASTY;  Surgeon: Corky Mull, MD;  Location: [facility] ORS;  Service: Orthopedics;  Laterality: Left;  Marland Kitchen VARICOSE VEIN SURGERY    .  VEIN SURGERY      SOCIAL HISTORY: Social History   Socioeconomic History  . Marital status: Widowed    Spouse name: Not on file  . Number of children: Not on file  . Years of education: Not on file  . Highest education level: Not on file  Occupational History  . Occupation: retired  Tobacco Use  . Smoking status: Never Smoker  . Smokeless tobacco: Never Used  Vaping Use  . Vaping Use: Never used  Substance and Sexual Activity  . Alcohol use: No    Alcohol/week: 0.0 standard drinks  . Drug use: No  .  Sexual activity: Not Currently  Other Topics Concern  . Not on file  Social History Narrative  . Not on file   Social Determinants of Health   Financial Resource Strain: Not on file  Food Insecurity: Not on file  Transportation Needs: Not on file  Physical Activity: Not on file  Stress: Not on file  Social Connections: Not on file  Intimate Partner Violence: Not on file    FAMILY HISTORY: Family History  Problem Relation Age of Onset  . Kidney disease Brother        also nephew  . Heart disease Mother   . Heart disease Father   . Prostate cancer Neg Hx   . Bladder Cancer Neg Hx   . Breast cancer Neg Hx   . Kidney cancer Neg Hx     ALLERGIES:  is allergic to cephalexin, nitrofurantoin, sulfa antibiotics, and atorvastatin.  MEDICATIONS:  Current Outpatient Medications  Medication Sig Dispense Refill  . acetaminophen (TYLENOL) 500 MG tablet Take 500 mg by mouth every 6 (six) hours as needed.    Marland Kitchen amLODipine (NORVASC) 10 MG tablet Take 10 mg by mouth daily at 2 PM.     . aspirin EC 81 MG tablet Take 81 mg by mouth daily.    Marland Kitchen azelastine (OPTIVAR) 0.05 % ophthalmic solution Apply to eye. Place 1 drop into both eyes 2 (two) times daily as needed    . busPIRone (BUSPAR) 10 MG tablet Take 20 mg by mouth 2 (two) times daily.     . carvedilol (COREG) 3.125 MG tablet Take 3.125 mg by mouth every other day.    . clindamycin (CLEOCIN) 300 MG capsule Take 300 mg by mouth 2 (two) times daily.    . hydrALAZINE (APRESOLINE) 100 MG tablet Take 1 tablet (100 mg total) by mouth 3 (three) times daily. 90 tablet 0  . hydrochlorothiazide (HYDRODIURIL) 25 MG tablet Take 1 tablet (25 mg total) by mouth daily. 30 tablet 0  . Iron-Vitamin C (VITRON-C) 65-125 MG TABS Take 1 tablet by mouth daily. 30 tablet 1  . levothyroxine (SYNTHROID, LEVOTHROID) 75 MCG tablet Take 75 mcg by mouth daily.     Marland Kitchen losartan (COZAAR) 100 MG tablet Take 100 mg by mouth daily.     . meclizine (ANTIVERT) 12.5 MG tablet  Take 1 tablet (12.5 mg total) by mouth 3 (three) times daily as needed for dizziness. 30 tablet 0  . metFORMIN (GLUCOPHAGE-XR) 500 MG 24 hr tablet Take 500 mg by mouth daily.     Marland Kitchen omeprazole (PRILOSEC) 40 MG capsule Take 40 mg by mouth daily.     Marland Kitchen PARoxetine (PAXIL) 40 MG tablet Take 40 mg by mouth every evening.     . traZODone (DESYREL) 50 MG tablet Take by mouth. Take 1 tablet (50 mg total) by mouth nightly as needed for Sleep 08/26/2020  [facility] 11/11/2020                AMLODIPINE BESYLATE (CALCIUM CHANNEL BLOCKERS)    . vitamin B-12 (CYANOCOBALAMIN) 500 MCG tablet Take 500 mcg by mouth daily.    Marland Kitchen ezetimibe (ZETIA) 10 MG tablet Take 10 mg by mouth daily.    . phenazopyridine (PYRIDIUM) 200 MG tablet Take 1 tablet (200 mg total) by mouth 3 (three) times daily as needed for pain. (Patient not taking: No sig reported) 12 tablet 0  . traMADol (ULTRAM) 50 MG tablet Take 1 tablet (50 mg total) by mouth every 6 (six) hours as needed for moderate pain. (Patient not taking: Reported on 12/01/2020) 20 tablet 0  . vitamin E 400 UNIT capsule Take 400 Units by mouth 4 (four) times a week.  (Patient not taking: Reported on 12/01/2020)     No current facility-administered medications for this visit.     PHYSICAL EXAMINATION: ECOG PERFORMANCE STATUS: 2 - Symptomatic, <50% confined to bed Vitals:   12/01/20 1409  BP: (!) 158/72  Pulse: 61  Resp: 20  Temp: 98.9 F (37.2 C)  SpO2: 95%   Filed Weights   12/01/20 1409  Weight: 173 lb 11.2 oz (78.8 kg)    Physical Exam Constitutional:      General: She is not in acute distress.    Comments: Patient walks independently today.  HENT:     Head: Normocephalic and atraumatic.  Eyes:     General: No scleral icterus. Cardiovascular:     Rate and Rhythm: Normal rate and regular rhythm.     Heart sounds: Normal heart sounds.  Pulmonary:     Effort: Pulmonary effort is normal. No respiratory distress.     Breath sounds: No  wheezing.  Abdominal:     General: Bowel sounds are normal. There is no distension.     Palpations: Abdomen is soft.  Musculoskeletal:        General: No deformity. Normal range of motion.     Cervical back: Normal range of motion and neck supple.  Skin:    General: Skin is warm and dry.     Findings: No erythema or rash.  Neurological:     Mental Status: She is alert and oriented to person, place, and time. Mental status is at baseline.     Cranial Nerves: No cranial nerve deficit.     Coordination: Coordination normal.  Psychiatric:        Mood and Affect: Mood normal.      LABORATORY DATA:  I have reviewed the data as listed Lab Results  Component Value Date   WBC 5.3 11/18/2020   HGB 10.2 (L) 11/18/2020   HCT 35.4 (L) 11/18/2020   MCV 87.6 11/18/2020   PLT 178 11/18/2020   Recent Labs    02/19/20 1618  NA 140  K 3.7  CL 100  CO2 29  GLUCOSE 104*  BUN 11  CREATININE 0.59  CALCIUM 9.7  GFRNONAA >60  GFRAA >60   Iron/TIBC/Ferritin/ %Sat    Component Value Date/Time   IRON 23 (L) 11/18/2020 1555   TIBC 476 (H) 11/18/2020 1555   FERRITIN 6 (L) 11/18/2020 1555   IRONPCTSAT 5 (L) 11/18/2020 1555        ASSESSMENT & PLAN:  1. Iron deficiency anemia, unspecified iron deficiency anemia type    Discussed with patient about the diagnosis of iron deficiency.  Etiology of the iron deficiency unknown. Patient tolerates oral iron supplementation and  recommend her to continue Vitron C daily. Repeat blood work in 8 weeks for evaluation of treatment response.  If no response, plan to start IV Venofer treatments.  Recommend patient to reestablish care with gastroenterology for work-up of iron deficiency.  She agrees.  Will refer to [facility] clinic gastroenterology.    Orders Placed This Encounter  Procedures  . CBC with Differential/Platelet    Standing Status:   Future    Standing Expiration Date:   12/01/2021  . Ferritin    Standing Status:   Future     Standing Expiration Date:   12/01/2021  . Iron and TIBC    Standing Status:   Future    Standing Expiration Date:   12/01/2021  . Ambulatory referral to Gastroenterology    Referral Priority:   Routine    Referral Type:   Consultation    Referral Reason:   Specialty Services Required    Referred to Provider:   Efrain Sella, MD    Number of Visits Requested:   1    All questions were answered. The patient knows to call the clinic with any problems questions or concerns. Darleen Crocker, MD  Return of visit: 8 weeks  Earlie Server, MD, PhD 12/01/2020

## 2021-01-30 ENCOUNTER — Inpatient Hospital Stay: Payer: Medicare PPO | Attending: Oncology

## 2021-02-02 ENCOUNTER — Other Ambulatory Visit: Payer: Self-pay | Admitting: Oncology

## 2021-02-02 DIAGNOSIS — D509 Iron deficiency anemia, unspecified: Secondary | ICD-10-CM

## 2021-02-03 ENCOUNTER — Telehealth: Payer: Self-pay | Admitting: Oncology

## 2021-02-03 ENCOUNTER — Inpatient Hospital Stay: Payer: Medicare PPO

## 2021-02-03 ENCOUNTER — Inpatient Hospital Stay: Payer: Medicare PPO | Admitting: Oncology

## 2021-02-03 NOTE — Telephone Encounter (Signed)
Patient called to reschedule her 6/14 appt as she has a UTI and does not feel well.  Appt rescheduled to 7/5 and confirmed.

## 2021-02-20 ENCOUNTER — Inpatient Hospital Stay: Payer: Medicare PPO | Attending: Oncology

## 2021-02-20 ENCOUNTER — Other Ambulatory Visit: Payer: Self-pay

## 2021-02-20 DIAGNOSIS — Z79899 Other long term (current) drug therapy: Secondary | ICD-10-CM | POA: Insufficient documentation

## 2021-02-20 DIAGNOSIS — D509 Iron deficiency anemia, unspecified: Secondary | ICD-10-CM

## 2021-02-20 DIAGNOSIS — R5383 Other fatigue: Secondary | ICD-10-CM | POA: Insufficient documentation

## 2021-02-20 LAB — CBC WITH DIFFERENTIAL/PLATELET
Abs Immature Granulocytes: 0.01 10*3/uL (ref 0.00–0.07)
Basophils Absolute: 0 10*3/uL (ref 0.0–0.1)
Basophils Relative: 1 %
Eosinophils Absolute: 0.1 10*3/uL (ref 0.0–0.5)
Eosinophils Relative: 3 %
HCT: 39.9 % (ref 36.0–46.0)
Hemoglobin: 11.7 g/dL — ABNORMAL LOW (ref 12.0–15.0)
Immature Granulocytes: 0 %
Lymphocytes Relative: 28 %
Lymphs Abs: 1.3 10*3/uL (ref 0.7–4.0)
MCH: 27.1 pg (ref 26.0–34.0)
MCHC: 29.3 g/dL — ABNORMAL LOW (ref 30.0–36.0)
MCV: 92.4 fL (ref 80.0–100.0)
Monocytes Absolute: 0.4 10*3/uL (ref 0.1–1.0)
Monocytes Relative: 9 %
Neutro Abs: 2.7 10*3/uL (ref 1.7–7.7)
Neutrophils Relative %: 59 %
Platelets: 190 10*3/uL (ref 150–400)
RBC: 4.32 MIL/uL (ref 3.87–5.11)
RDW: 16.9 % — ABNORMAL HIGH (ref 11.5–15.5)
WBC: 4.6 10*3/uL (ref 4.0–10.5)
nRBC: 0 % (ref 0.0–0.2)

## 2021-02-20 LAB — IRON AND TIBC
Iron: 65 ug/dL (ref 28–170)
Saturation Ratios: 16 % (ref 10.4–31.8)
TIBC: 410 ug/dL (ref 250–450)
UIBC: 345 ug/dL

## 2021-02-20 LAB — FERRITIN: Ferritin: 10 ng/mL — ABNORMAL LOW (ref 11–307)

## 2021-02-24 ENCOUNTER — Inpatient Hospital Stay: Payer: Medicare PPO

## 2021-02-24 ENCOUNTER — Encounter: Payer: Self-pay | Admitting: Oncology

## 2021-02-24 ENCOUNTER — Inpatient Hospital Stay: Payer: Medicare PPO | Admitting: Oncology

## 2021-02-24 VITALS — BP 163/75 | HR 67 | Temp 98.5°F | Resp 18 | Wt 170.0 lb

## 2021-02-24 DIAGNOSIS — R5383 Other fatigue: Secondary | ICD-10-CM | POA: Diagnosis not present

## 2021-02-24 DIAGNOSIS — D509 Iron deficiency anemia, unspecified: Secondary | ICD-10-CM | POA: Diagnosis not present

## 2021-02-24 MED ORDER — VITRON-C 65-125 MG PO TABS: 1.0000 | ORAL_TABLET | Freq: Every day | ORAL | 1 refills | Status: AC

## 2021-02-24 NOTE — Progress Notes (Signed)
Hematology/Oncology Consult note [facility] Telephone:([phone] [phone]   Patient Care Team: Dion Body, MD as PCP - General (Family Medicine)  REFERRING PROVIDER: Dion Body, MD  CHIEF COMPLAINTS/REASON FOR VISIT:  Evaluation of anemia  HISTORY OF PRESENTING ILLNESS:  Mary [name] is a  86 y.o.  female with PMH listed below who was referred to me for evaluation of anemia Reviewed patient's recent labs that was done.  11/11/2020 labs revealed anemia with hemoglobin of 10.3, MCV 88.8, WBC 5.1,.   Reviewed patient's previous labs ordered by primary care physician's office, anemia is chronic onset , duration is since at least 2017. No aggravating or improving factors.  Associated signs and symptoms: Patient reports chronic fatigue.  Denies SOB with exertion.  Denies weight loss, easy bruising, hematochezia, hemoptysis, hematuria. Context: History of GI bleeding: Denies               History of Chronic kidney disease: She has normal creatinine at baseline.               History of autoimmune disease: Denies               Last colonoscopy:  more than 10 years ago not in EMR.   INTERVAL HISTORY Mary [name] is a 86 y.o. female who has above history reviewed by me today presents for follow up visit for management of iron deficiency anemia Problems and complaints are listed below: Patient continues to feel fatigued.  She is taking iron supplementation daily.  Tolerates well.  Review of Systems  Constitutional:  Positive for fatigue. Negative for appetite change, chills and fever.  HENT:   Negative for hearing loss and voice change.   Eyes:  Negative for eye problems.  Respiratory:  Negative for chest tightness and cough.   Cardiovascular:  Negative for chest pain.  Gastrointestinal:  Negative for abdominal distention, abdominal pain and blood in stool.  Endocrine: Negative for hot flashes.  Genitourinary:  Negative for  difficulty urinating and frequency.   Musculoskeletal:  Negative for arthralgias.  Skin:  Negative for itching and rash.  Neurological:  Negative for extremity weakness.  Hematological:  Negative for adenopathy.  Psychiatric/Behavioral:  Negative for confusion.     MEDICAL HISTORY:  Past Medical History:  Diagnosis Date   Acid reflux    Arthritis    Asthma    Depression    Diabetes ([place])    Dysrhythmia    Heart murmur    HLD (hyperlipidemia)    HTN (hypertension)    Hypotension    when get up too quickly   Sleep apnea    Urinary urgency     SURGICAL HISTORY: Past Surgical History:  Procedure Laterality Date   ABDOMINAL HYSTERECTOMY     CYSTOSCOPY     HIP ARTHROPLASTY Right 09/24/2015   Procedure: ARTHROPLASTY BIPOLAR HIP (HEMIARTHROPLASTY);  Surgeon: Corky Mull, MD;  Location: [facility] ORS;  Service: Orthopedics;  Laterality: Right;   HIP ARTHROPLASTY Left 09/29/2018   Procedure: ARTHROPLASTY BIPOLAR HIP (HEMIARTHROPLASTY) LEFT;  Surgeon: Corky Mull, MD;  Location: [facility] ORS;  Service: Orthopedics;  Laterality: Left;   JOINT REPLACEMENT     right hip   JOINT REPLACEMENT     right knee   KNEE ARTHROSCOPY W/ AUTOGENOUS CARTILAGE IMPLANTATION (ACI) PROCEDURE     TOTAL KNEE ARTHROPLASTY Left 12/01/2017   Procedure: TOTAL KNEE ARTHROPLASTY;  Surgeon: Corky Mull, MD;  Location: [facility] ORS;  Service: Orthopedics;  Laterality:  Left;   VARICOSE VEIN SURGERY     VEIN SURGERY      SOCIAL HISTORY: Social History   Socioeconomic History   Marital status: Widowed    Spouse name: Not on file   Number of children: Not on file   Years of education: Not on file   Highest education level: Not on file  Occupational History   Occupation: retired  Tobacco Use   Smoking status: Never   Smokeless tobacco: Never  Vaping Use   Vaping Use: Never used  Substance and Sexual Activity   Alcohol use: No    Alcohol/week: 0.0 standard drinks   Drug use: No   Sexual activity: Not Currently   Other Topics Concern   Not on file  Social History Narrative   Not on file   Social Determinants of Health   Financial Resource Strain: Not on file  Food Insecurity: Not on file  Transportation Needs: Not on file  Physical Activity: Not on file  Stress: Not on file  Social Connections: Not on file  Intimate Partner Violence: Not on file    FAMILY HISTORY: Family History  Problem Relation Age of Onset   Kidney disease Brother        also nephew   Heart disease Mother    Heart disease Father    Prostate cancer Neg Hx    Bladder Cancer Neg Hx    Breast cancer Neg Hx    Kidney cancer Neg Hx     ALLERGIES:  is allergic to cephalexin, nitrofurantoin, sulfa antibiotics, and atorvastatin.  MEDICATIONS:  Current Outpatient Medications  Medication Sig Dispense Refill   acetaminophen (TYLENOL) 500 MG tablet Take 500 mg by mouth every 6 (six) hours as needed.     amLODipine (NORVASC) 10 MG tablet Take 10 mg by mouth daily at 2 PM.      aspirin EC 81 MG tablet Take 81 mg by mouth daily.     azelastine (OPTIVAR) 0.05 % ophthalmic solution Apply to eye. Place 1 drop into both eyes 2 (two) times daily as needed     busPIRone (BUSPAR) 10 MG tablet Take 20 mg by mouth 2 (two) times daily.      carvedilol (COREG) 3.125 MG tablet Take 3.125 mg by mouth every other day.     ezetimibe (ZETIA) 10 MG tablet Take 10 mg by mouth daily.     hydrALAZINE (APRESOLINE) 100 MG tablet Take 1 tablet (100 mg total) by mouth 3 (three) times daily. 90 tablet 0   hydrochlorothiazide (HYDRODIURIL) 25 MG tablet Take 1 tablet (25 mg total) by mouth daily. 30 tablet 0   levothyroxine (SYNTHROID, LEVOTHROID) 75 MCG tablet Take 75 mcg by mouth daily.      losartan (COZAAR) 100 MG tablet Take 100 mg by mouth daily.      meclizine (ANTIVERT) 12.5 MG tablet Take 1 tablet (12.5 mg total) by mouth 3 (three) times daily as needed for dizziness. 30 tablet 0   metFORMIN (GLUCOPHAGE-XR) 500 MG 24 hr tablet Take 500 mg by  mouth daily.      omeprazole (PRILOSEC) 40 MG capsule Take 40 mg by mouth daily.      traZODone (DESYREL) 50 MG tablet Take by mouth. Take 1 tablet (50 mg total) by mouth nightly as needed for Sleep 08/26/2020  [facility] 11/11/2020                AMLODIPINE BESYLATE (CALCIUM CHANNEL BLOCKERS)     vitamin  B-12 (CYANOCOBALAMIN) 500 MCG tablet Take 500 mcg by mouth daily.     clindamycin (CLEOCIN) 300 MG capsule Take 300 mg by mouth 2 (two) times daily. (Patient not taking: Reported on 02/24/2021)     Iron-Vitamin C (VITRON-C) 65-125 MG TABS Take 1 tablet by mouth daily. 90 tablet 1   PARoxetine (PAXIL) 40 MG tablet Take 40 mg by mouth every evening.  (Patient not taking: Reported on 02/24/2021)     traMADol (ULTRAM) 50 MG tablet Take 1 tablet (50 mg total) by mouth every 6 (six) hours as needed for moderate pain. (Patient not taking: No sig reported) 20 tablet 0   vitamin E 400 UNIT capsule Take 400 Units by mouth 4 (four) times a week.  (Patient not taking: No sig reported)     No current facility-administered medications for this visit.     PHYSICAL EXAMINATION: ECOG PERFORMANCE STATUS: 2 - Symptomatic, <50% confined to bed Vitals:   02/24/21 1356  BP: (!) 163/75  Pulse: 67  Resp: 18  Temp: 98.5 F (36.9 C)  SpO2: 95%   Filed Weights   02/24/21 1356  Weight: 170 lb (77.1 kg)    Physical Exam Constitutional:      General: She is not in acute distress.    Comments: Patient walks independently today.  HENT:     Head: Normocephalic and atraumatic.  Eyes:     General: No scleral icterus. Cardiovascular:     Rate and Rhythm: Normal rate and regular rhythm.     Heart sounds: Normal heart sounds.  Pulmonary:     Effort: Pulmonary effort is normal. No respiratory distress.     Breath sounds: No wheezing.  Abdominal:     General: Bowel sounds are normal. There is no distension.     Palpations: Abdomen is soft.  Musculoskeletal:        General: No deformity. Normal  range of motion.     Cervical back: Normal range of motion and neck supple.  Skin:    General: Skin is warm and dry.     Findings: No erythema or rash.  Neurological:     Mental Status: She is alert and oriented to person, place, and time. Mental status is at baseline.     Cranial Nerves: No cranial nerve deficit.     Coordination: Coordination normal.  Psychiatric:        Mood and Affect: Mood normal.     LABORATORY DATA:  I have reviewed the data as listed Lab Results  Component Value Date   WBC 4.6 02/20/2021   HGB 11.7 (L) 02/20/2021   HCT 39.9 02/20/2021   MCV 92.4 02/20/2021   PLT 190 02/20/2021   No results for input(s): NA, K, CL, CO2, GLUCOSE, BUN, CREATININE, CALCIUM, GFRNONAA, GFRAA, PROT, ALBUMIN, AST, ALT, ALKPHOS, BILITOT, BILIDIR, IBILI in the last 8760 hours.  Iron/TIBC/Ferritin/ %Sat    Component Value Date/Time   IRON 65 02/20/2021 1300   TIBC 410 02/20/2021 1300   FERRITIN 10 (L) 02/20/2021 1300   IRONPCTSAT 16 02/20/2021 1300        ASSESSMENT & PLAN:  1. Iron deficiency anemia, unspecified iron deficiency anemia type   2. Other fatigue    Labs are reviewed with the patient. Hemoglobin has improved to 11.7.  Iron saturation improved to 16.  Ferritin borderline at 10. Since she has responded very well to oral iron supplementation.  I encourage patient to continue Vitron C daily.  Fatigue is persistent despite improvement  of hemoglobin close to normal.  Discussed with patient that fatigue may not be secondary to anemia.  I urged patient to keep her appointment with gastroenterology.  She was previously referred to GI for work-up of IDA.  She had an appointment which she rescheduled to August 2022.  Encourage patient to keep appointment.   Orders Placed This Encounter  Procedures   CBC with Differential/Platelet    Standing Status:   Future    Standing Expiration Date:   02/24/2022   Ferritin    Standing Status:   Future    Standing Expiration  Date:   02/24/2022   Iron and TIBC    Standing Status:   Future    Standing Expiration Date:   02/24/2022   Vitamin B12    Standing Status:   Future    Standing Expiration Date:   02/24/2022    All questions were answered. The patient knows to call the clinic with any problems questions or concerns. Darleen Crocker, MD  Return of visit:  3 months Earlie Server, MD, PhD 02/24/2021

## 2021-02-24 NOTE — Progress Notes (Signed)
Patient here for oncology follow-up appointment, expresses concerns of fatigue, SOB and blurry vision

## 2021-05-25 ENCOUNTER — Inpatient Hospital Stay: Payer: Medicare PPO | Attending: Oncology

## 2021-05-25 DIAGNOSIS — D509 Iron deficiency anemia, unspecified: Secondary | ICD-10-CM | POA: Insufficient documentation

## 2021-05-27 ENCOUNTER — Inpatient Hospital Stay: Payer: Medicare PPO

## 2021-05-27 ENCOUNTER — Inpatient Hospital Stay: Payer: Medicare PPO | Admitting: Oncology

## 2021-05-27 ENCOUNTER — Other Ambulatory Visit: Payer: Self-pay

## 2021-05-27 ENCOUNTER — Encounter: Payer: Self-pay | Admitting: Oncology

## 2021-05-27 VITALS — BP 164/73 | HR 53 | Temp 98.4°F | Resp 18 | Wt 173.0 lb

## 2021-05-27 DIAGNOSIS — D509 Iron deficiency anemia, unspecified: Secondary | ICD-10-CM

## 2021-05-27 LAB — IRON AND TIBC
Iron: 51 ug/dL (ref 28–170)
Saturation Ratios: 12 % (ref 10.4–31.8)
TIBC: 430 ug/dL (ref 250–450)
UIBC: 379 ug/dL

## 2021-05-27 LAB — CBC WITH DIFFERENTIAL/PLATELET
Abs Immature Granulocytes: 0.01 10*3/uL (ref 0.00–0.07)
Basophils Absolute: 0.1 10*3/uL (ref 0.0–0.1)
Basophils Relative: 1 %
Eosinophils Absolute: 0.2 10*3/uL (ref 0.0–0.5)
Eosinophils Relative: 2 %
HCT: 40.1 % (ref 36.0–46.0)
Hemoglobin: 12.3 g/dL (ref 12.0–15.0)
Immature Granulocytes: 0 %
Lymphocytes Relative: 28 %
Lymphs Abs: 1.7 10*3/uL (ref 0.7–4.0)
MCH: 29.4 pg (ref 26.0–34.0)
MCHC: 30.7 g/dL (ref 30.0–36.0)
MCV: 95.9 fL (ref 80.0–100.0)
Monocytes Absolute: 0.5 10*3/uL (ref 0.1–1.0)
Monocytes Relative: 8 %
Neutro Abs: 3.7 10*3/uL (ref 1.7–7.7)
Neutrophils Relative %: 61 %
Platelets: 170 10*3/uL (ref 150–400)
RBC: 4.18 MIL/uL (ref 3.87–5.11)
RDW: 14.2 % (ref 11.5–15.5)
WBC: 6.1 10*3/uL (ref 4.0–10.5)
nRBC: 0 % (ref 0.0–0.2)

## 2021-05-27 LAB — VITAMIN B12: Vitamin B-12: 1954 pg/mL — ABNORMAL HIGH (ref 180–914)

## 2021-05-27 LAB — FERRITIN: Ferritin: 9 ng/mL — ABNORMAL LOW (ref 11–307)

## 2021-05-27 NOTE — Progress Notes (Signed)
Hematology/Oncology follow up note [facility] Telephone:(336) [phone] Fax:(336) [phone]   Patient Care Team: Dion Body, MD as PCP - General (Family Medicine)  REFERRING PROVIDER: Dion Body, MD  CHIEF COMPLAINTS/REASON FOR VISIT:  Follow-up for iron deficiency anemia  HISTORY OF PRESENTING ILLNESS:  Mary [name] is a  86 y.o.  female with PMH listed below who was referred to me for evaluation of anemia Reviewed patient's recent labs that was done.  11/11/2020 labs revealed anemia with hemoglobin of 10.3, MCV 88.8, WBC 5.1,.   Reviewed patient's previous labs ordered by primary care physician's office, anemia is chronic onset , duration is since at least 2017. No aggravating or improving factors.  Associated signs and symptoms: Patient reports chronic fatigue.  Denies SOB with exertion.  Denies weight loss, easy bruising, hematochezia, hemoptysis, hematuria. Context: History of GI bleeding: Denies               History of Chronic kidney disease: She has normal creatinine at baseline.               History of autoimmune disease: Denies               Last colonoscopy:  more than 10 years ago not in EMR.   INTERVAL HISTORY Mary [name] is a 86 y.o. female who has above history reviewed by me today presents for follow up visit for management of iron deficiency anemia Patient takes Vitron-C once daily. Has no new complaints.  Denies any black or bloody stool.  Review of Systems  Constitutional:  Positive for fatigue. Negative for appetite change, chills and fever.  HENT:   Negative for hearing loss and voice change.   Eyes:  Negative for eye problems.  Respiratory:  Negative for chest tightness and cough.   Cardiovascular:  Negative for chest pain.  Gastrointestinal:  Negative for abdominal distention, abdominal pain and blood in stool.  Endocrine: Negative for hot flashes.  Genitourinary:  Negative for difficulty urinating and frequency.    Musculoskeletal:  Negative for arthralgias.  Skin:  Negative for itching and rash.  Neurological:  Negative for extremity weakness.  Hematological:  Negative for adenopathy.  Psychiatric/Behavioral:  Negative for confusion.     MEDICAL HISTORY:  Past Medical History:  Diagnosis Date   Acid reflux    Arthritis    Asthma    Depression    Diabetes ([place])    Dysrhythmia    Heart murmur    HLD (hyperlipidemia)    HTN (hypertension)    Hypotension    when get up too quickly   Sleep apnea    Urinary urgency     SURGICAL HISTORY: Past Surgical History:  Procedure Laterality Date   ABDOMINAL HYSTERECTOMY     CYSTOSCOPY     HIP ARTHROPLASTY Right 09/24/2015   Procedure: ARTHROPLASTY BIPOLAR HIP (HEMIARTHROPLASTY);  Surgeon: Corky Mull, MD;  Location: [facility] ORS;  Service: Orthopedics;  Laterality: Right;   HIP ARTHROPLASTY Left 09/29/2018   Procedure: ARTHROPLASTY BIPOLAR HIP (HEMIARTHROPLASTY) LEFT;  Surgeon: Corky Mull, MD;  Location: [facility] ORS;  Service: Orthopedics;  Laterality: Left;   JOINT REPLACEMENT     right hip   JOINT REPLACEMENT     right knee   KNEE ARTHROSCOPY W/ AUTOGENOUS CARTILAGE IMPLANTATION (ACI) PROCEDURE     TOTAL KNEE ARTHROPLASTY Left 12/01/2017   Procedure: TOTAL KNEE ARTHROPLASTY;  Surgeon: Corky Mull, MD;  Location: [facility] ORS;  Service: Orthopedics;  Laterality: Left;  VARICOSE VEIN SURGERY     VEIN SURGERY      SOCIAL HISTORY: Social History   Socioeconomic History   Marital status: Widowed    Spouse name: Not on file   Number of children: Not on file   Years of education: Not on file   Highest education level: Not on file  Occupational History   Occupation: retired  Tobacco Use   Smoking status: Never   Smokeless tobacco: Never  Vaping Use   Vaping Use: Never used  Substance and Sexual Activity   Alcohol use: No    Alcohol/week: 0.0 standard drinks   Drug use: No   Sexual activity: Not Currently  Other Topics Concern   Not on  file  Social History Narrative   Not on file   Social Determinants of Health   Financial Resource Strain: Not on file  Food Insecurity: Not on file  Transportation Needs: Not on file  Physical Activity: Not on file  Stress: Not on file  Social Connections: Not on file  Intimate Partner Violence: Not on file    FAMILY HISTORY: Family History  Problem Relation Age of Onset   Kidney disease Brother        also nephew   Heart disease Mother    Heart disease Father    Prostate cancer Neg Hx    Bladder Cancer Neg Hx    Breast cancer Neg Hx    Kidney cancer Neg Hx     ALLERGIES:  is allergic to cephalexin, nitrofurantoin, sulfa antibiotics, and atorvastatin.  MEDICATIONS:  Current Outpatient Medications  Medication Sig Dispense Refill   acetaminophen (TYLENOL) 500 MG tablet Take 500 mg by mouth every 6 (six) hours as needed.     amLODipine (NORVASC) 10 MG tablet Take 10 mg by mouth daily at 2 PM.      aspirin EC 81 MG tablet Take 81 mg by mouth daily.     azelastine (OPTIVAR) 0.05 % ophthalmic solution Apply to eye. Place 1 drop into both eyes 2 (two) times daily as needed     busPIRone (BUSPAR) 10 MG tablet Take 20 mg by mouth 2 (two) times daily.      carvedilol (COREG) 3.125 MG tablet Take 3.125 mg by mouth every other day.     ezetimibe (ZETIA) 10 MG tablet Take 10 mg by mouth daily.     hydrALAZINE (APRESOLINE) 100 MG tablet Take 1 tablet (100 mg total) by mouth 3 (three) times daily. 90 tablet 0   hydrochlorothiazide (HYDRODIURIL) 25 MG tablet Take 1 tablet (25 mg total) by mouth daily. 30 tablet 0   Iron-Vitamin C (VITRON-C) 65-125 MG TABS Take 1 tablet by mouth daily. 90 tablet 1   levothyroxine (SYNTHROID, LEVOTHROID) 75 MCG tablet Take 75 mcg by mouth daily.      losartan (COZAAR) 100 MG tablet Take 100 mg by mouth daily.      metFORMIN (GLUCOPHAGE-XR) 500 MG 24 hr tablet Take 500 mg by mouth daily.      omeprazole (PRILOSEC) 40 MG capsule Take 40 mg by mouth daily.       PARoxetine (PAXIL) 40 MG tablet Take 40 mg by mouth every evening.     traZODone (DESYREL) 50 MG tablet Take 25 mg by mouth. Take 1 tablet (50 mg total) by mouth nightly as needed for Sleep 08/26/2020  [place] 11/11/2020                AMLODIPINE BESYLATE (CALCIUM CHANNEL  BLOCKERS)     vitamin B-12 (CYANOCOBALAMIN) 500 MCG tablet Take 500 mcg by mouth daily.     vitamin E 400 UNIT capsule Take 400 Units by mouth 4 (four) times a week.     clindamycin (CLEOCIN) 300 MG capsule Take 300 mg by mouth 2 (two) times daily.     meclizine (ANTIVERT) 12.5 MG tablet Take 1 tablet (12.5 mg total) by mouth 3 (three) times daily as needed for dizziness. (Patient not taking: Reported on 05/27/2021) 30 tablet 0   traMADol (ULTRAM) 50 MG tablet Take 1 tablet (50 mg total) by mouth every 6 (six) hours as needed for moderate pain. 20 tablet 0   No current facility-administered medications for this visit.     PHYSICAL EXAMINATION: ECOG PERFORMANCE STATUS: 2 - Symptomatic, <50% confined to bed Vitals:   05/27/21 1344  BP: (!) 164/73  Pulse: (!) 53  Resp: 18  Temp: 98.4 F (36.9 C)  SpO2: 94%   Filed Weights   05/27/21 1344  Weight: 173 lb (78.5 kg)    Physical Exam Constitutional:      General: She is not in acute distress.    Comments: Patient walks independently today.  HENT:     Head: Normocephalic and atraumatic.  Eyes:     General: No scleral icterus. Cardiovascular:     Rate and Rhythm: Normal rate and regular rhythm.     Heart sounds: Normal heart sounds.  Pulmonary:     Effort: Pulmonary effort is normal. No respiratory distress.     Breath sounds: No wheezing.  Abdominal:     General: Bowel sounds are normal. There is no distension.     Palpations: Abdomen is soft.  Musculoskeletal:        General: No deformity. Normal range of motion.     Cervical back: Normal range of motion and neck supple.  Skin:    General: Skin is warm and dry.     Findings: No  erythema or rash.  Neurological:     Mental Status: She is alert and oriented to person, place, and time. Mental status is at baseline.     Cranial Nerves: No cranial nerve deficit.     Coordination: Coordination normal.  Psychiatric:        Mood and Affect: Mood normal.     LABORATORY DATA:  I have reviewed the data as listed Lab Results  Component Value Date   WBC 6.1 05/27/2021   HGB 12.3 05/27/2021   HCT 40.1 05/27/2021   MCV 95.9 05/27/2021   PLT 170 05/27/2021   No results for input(s): NA, K, CL, CO2, GLUCOSE, BUN, CREATININE, CALCIUM, GFRNONAA, GFRAA, PROT, ALBUMIN, AST, ALT, ALKPHOS, BILITOT, BILIDIR, IBILI in the last 8760 hours.  Iron/TIBC/Ferritin/ %Sat    Component Value Date/Time   IRON 51 05/27/2021 1430   TIBC 430 05/27/2021 1430   FERRITIN 9 (L) 05/27/2021 1430   IRONPCTSAT 12 05/27/2021 1430        ASSESSMENT & PLAN:  1. Iron deficiency anemia, unspecified iron deficiency anemia type    #Iron deficiency anemia, Labs are reviewed and discussed with patient Hemoglobin continues to improve, now normalized at 12.3. Iron panel showed stable mildly decreased iron deficiency with a ferritin of 9.  Since patient has had improvement with oral iron supplementation, I will hold off IV Venofer treatments. Continue oral iron supplementation.  Patient has establish care with GI Dr. Alice Reichert.-Plan for patient to do EGD and colonoscopy for further evaluation.  Orders Placed This Encounter  Procedures   CBC with Differential/Platelet    Standing Status:   Future    Standing Expiration Date:   05/27/2022   Ferritin    Standing Status:   Future    Standing Expiration Date:   05/27/2022   Iron and TIBC    Standing Status:   Future    Standing Expiration Date:   05/27/2022    All questions were answered. The patient knows to call the clinic with any problems questions or concerns. Darleen Crocker, MD  Return of visit:  6 months Earlie Server, MD,  PhD 05/27/2021

## 2021-05-27 NOTE — Progress Notes (Signed)
Patient here for oncology follow-up appointment, concerns of SOB and chills

## 2021-07-28 ENCOUNTER — Encounter: Payer: Self-pay | Admitting: Internal Medicine

## 2021-07-29 ENCOUNTER — Encounter: Payer: Self-pay | Admitting: Internal Medicine

## 2021-07-29 ENCOUNTER — Ambulatory Visit: Payer: Medicare PPO | Admitting: Certified Registered"

## 2021-07-29 ENCOUNTER — Encounter
Admission: RE | Disposition: A | Discharge status: Home / Self Care | Payer: Self-pay | Source: Home / Self Care | Attending: Internal Medicine

## 2021-07-29 ENCOUNTER — Ambulatory Visit
Admission: RE | Admit: 2021-07-29 | Discharge: 2021-07-29 | Disposition: A | Discharge status: Home / Self Care | Payer: Medicare PPO | Attending: Internal Medicine | Admitting: Internal Medicine

## 2021-07-29 DIAGNOSIS — E11319 Type 2 diabetes mellitus with unspecified diabetic retinopathy without macular edema: Secondary | ICD-10-CM | POA: Insufficient documentation

## 2021-07-29 DIAGNOSIS — K219 Gastro-esophageal reflux disease without esophagitis: Secondary | ICD-10-CM | POA: Diagnosis not present

## 2021-07-29 DIAGNOSIS — D500 Iron deficiency anemia secondary to blood loss (chronic): Secondary | ICD-10-CM | POA: Insufficient documentation

## 2021-07-29 DIAGNOSIS — G709 Myoneural disorder, unspecified: Secondary | ICD-10-CM | POA: Insufficient documentation

## 2021-07-29 DIAGNOSIS — K5730 Diverticulosis of large intestine without perforation or abscess without bleeding: Secondary | ICD-10-CM | POA: Diagnosis not present

## 2021-07-29 DIAGNOSIS — E039 Hypothyroidism, unspecified: Secondary | ICD-10-CM | POA: Insufficient documentation

## 2021-07-29 DIAGNOSIS — I2510 Atherosclerotic heart disease of native coronary artery without angina pectoris: Secondary | ICD-10-CM | POA: Diagnosis not present

## 2021-07-29 DIAGNOSIS — M069 Rheumatoid arthritis, unspecified: Secondary | ICD-10-CM | POA: Insufficient documentation

## 2021-07-29 DIAGNOSIS — G4730 Sleep apnea, unspecified: Secondary | ICD-10-CM | POA: Insufficient documentation

## 2021-07-29 DIAGNOSIS — D122 Benign neoplasm of ascending colon: Secondary | ICD-10-CM | POA: Diagnosis not present

## 2021-07-29 DIAGNOSIS — J45909 Unspecified asthma, uncomplicated: Secondary | ICD-10-CM | POA: Diagnosis not present

## 2021-07-29 DIAGNOSIS — K2950 Unspecified chronic gastritis without bleeding: Secondary | ICD-10-CM | POA: Insufficient documentation

## 2021-07-29 DIAGNOSIS — K2289 Other specified disease of esophagus: Secondary | ICD-10-CM | POA: Insufficient documentation

## 2021-07-29 DIAGNOSIS — K449 Diaphragmatic hernia without obstruction or gangrene: Secondary | ICD-10-CM | POA: Diagnosis not present

## 2021-07-29 DIAGNOSIS — K640 First degree hemorrhoids: Secondary | ICD-10-CM | POA: Insufficient documentation

## 2021-07-29 DIAGNOSIS — K2270 Barrett's esophagus without dysplasia: Secondary | ICD-10-CM | POA: Insufficient documentation

## 2021-07-29 DIAGNOSIS — I10 Essential (primary) hypertension: Secondary | ICD-10-CM | POA: Diagnosis not present

## 2021-07-29 HISTORY — PX: ESOPHAGOGASTRODUODENOSCOPY: SHX5428

## 2021-07-29 HISTORY — DX: Rheumatoid arthritis, unspecified: M06.9

## 2021-07-29 HISTORY — DX: Other intervertebral disc degeneration, lumbar region without mention of lumbar back pain or lower extremity pain: M51.369

## 2021-07-29 HISTORY — DX: Other intervertebral disc degeneration, lumbar region: M51.36

## 2021-07-29 HISTORY — PX: COLONOSCOPY: SHX5424

## 2021-07-29 HISTORY — DX: Deficiency of other specified B group vitamins: E53.8

## 2021-07-29 HISTORY — DX: Other bacterial infections of unspecified site: A49.8

## 2021-07-29 HISTORY — DX: Hypothyroidism, unspecified: E03.9

## 2021-07-29 HISTORY — DX: Atherosclerosis of aorta: I70.0

## 2021-07-29 HISTORY — DX: Anemia, unspecified: D64.9

## 2021-07-29 HISTORY — DX: Atherosclerotic heart disease of native coronary artery without angina pectoris: I25.10

## 2021-07-29 HISTORY — DX: Type 2 diabetes mellitus with unspecified diabetic retinopathy without macular edema: E11.319

## 2021-07-29 HISTORY — DX: Solitary pulmonary nodule: R91.1

## 2021-07-29 SURGERY — EGD (ESOPHAGOGASTRODUODENOSCOPY)
Anesthesia: General

## 2021-07-29 MED ORDER — PROPOFOL 500 MG/50ML IV EMUL
INTRAVENOUS | Status: AC
  Filled 2021-07-29: qty 50

## 2021-07-29 MED ORDER — SODIUM CHLORIDE 0.9 % IV SOLN
INTRAVENOUS | Status: DC
  Administered 2021-07-29: 1000 mL via INTRAVENOUS

## 2021-07-29 MED ORDER — LIDOCAINE HCL (CARDIAC) PF 100 MG/5ML IV SOSY
PREFILLED_SYRINGE | INTRAVENOUS | Status: DC | PRN
  Administered 2021-07-29: 50 mg via INTRAVENOUS

## 2021-07-29 MED ORDER — PROPOFOL 500 MG/50ML IV EMUL
INTRAVENOUS | Status: DC | PRN
  Administered 2021-07-29: 175 ug/kg/min via INTRAVENOUS

## 2021-07-29 MED ORDER — PROPOFOL 10 MG/ML IV BOLUS
INTRAVENOUS | Status: AC
  Filled 2021-07-29: qty 20

## 2021-07-29 MED ORDER — PROPOFOL 10 MG/ML IV BOLUS
INTRAVENOUS | Status: DC | PRN
  Administered 2021-07-29: 40 mg via INTRAVENOUS

## 2021-07-29 MED ORDER — HYDRALAZINE HCL 20 MG/ML IJ SOLN
INTRAMUSCULAR | Status: DC | PRN
  Administered 2021-07-29: 10 mg via INTRAVENOUS

## 2021-07-29 NOTE — Op Note (Signed)
[facility] Gastroenterology Patient Name: Mary [name] Procedure Date: 07/29/2021 9:21 AM MRN: [id number] Account #: [id number] Date of Birth: August 25, 1934 Admit Type: Outpatient Age: 86 Room: [facility] ENDO ROOM 2 Gender: Female Note Status: Finalized Instrument Name: Park Meo [id number] Procedure:             Colonoscopy Indications:           Iron deficiency anemia secondary to chronic blood loss Providers:             Lorie Apley K. Alice Reichert MD, MD Referring MD:          Dion Body (Referring MD) Medicines:             Propofol per Anesthesia Complications:         No immediate complications. Procedure:             Pre-Anesthesia Assessment:                        - The risks and benefits of the procedure and the                         sedation options and risks were discussed with the                         patient. All questions were answered and informed                         consent was obtained.                        - Patient identification and proposed procedure were                         verified prior to the procedure by the nurse. The                         procedure was verified in the procedure room.                        - ASA Grade Assessment: III - A patient with severe                         systemic disease.                        - After reviewing the risks and benefits, the patient                         was deemed in satisfactory condition to undergo the                         procedure.                        After obtaining informed consent, the colonoscope was                         passed under direct vision. Throughout the procedure,  the patient's blood pressure, pulse, and oxygen                         saturations were monitored continuously. The                         Colonoscope was introduced through the anus and                         advanced to the the cecum, identified by appendiceal                          orifice and ileocecal valve. The colonoscopy was                         performed without difficulty. The patient tolerated                         the procedure well. The quality of the bowel                         preparation was good. The ileocecal valve, appendiceal                         orifice, and rectum were photographed. Findings:      The perianal and digital rectal examinations were normal. Pertinent       negatives include normal sphincter tone and no palpable rectal lesions.      Non-bleeding internal hemorrhoids were found during retroflexion. The       hemorrhoids were Grade I (internal hemorrhoids that do not prolapse).      A few small-mouthed diverticula were found in the sigmoid colon.      Two sessile polyps were found in the ascending colon and cecum. The       polyps were 3 to 5 mm in size. These polyps were removed with a jumbo       cold forceps. Resection and retrieval were complete.      A 7 mm polyp was found in the ascending colon. The polyp was sessile.       The polyp was removed with a cold snare. Resection was complete, but the       polyp tissue was not retrieved.      The exam was otherwise without abnormality. Impression:            - Non-bleeding internal hemorrhoids.                        - Diverticulosis in the sigmoid colon.                        - Two 3 to 5 mm polyps in the ascending colon and in                         the cecum, removed with a jumbo cold forceps. Resected                         and retrieved.                        -  One 7 mm polyp in the ascending colon, removed with                         a cold snare. Complete resection. Polyp tissue not                         retrieved.                        - The examination was otherwise normal. Recommendation:        - Await pathology results from EGD, also performed                         today.                        - Patient has a contact number available for                          emergencies. The signs and symptoms of potential                         delayed complications were discussed with the patient.                         Return to normal activities tomorrow. Written                         discharge instructions were provided to the patient.                        - Resume previous diet.                        - Continue present medications.                        - Await pathology results.                        - If polyps are benign or adenomatous without                         dysplasia, I will advise NO further colonoscopy due to                         advanced age and/or severe comorbidity.                        - I will advise wireless capsule endoscopy of the                         small intestine if small bowel biopsies are negative                         for celiac sprue.                        - The findings and recommendations were discussed with  the patient. Procedure Code(s):     --- Professional ---                        [phone], Colonoscopy, flexible; with removal of                         tumor(s), polyp(s), or other lesion(s) by snare                         technique                        45380, 41, Colonoscopy, flexible; with biopsy, single                         or multiple Diagnosis Code(s):     --- Professional ---                        K57.30, Diverticulosis of large intestine without                         perforation or abscess without bleeding                        D50.0, Iron deficiency anemia secondary to blood loss                         (chronic)                        K63.5, Polyp of colon                        K64.0, First degree hemorrhoids CPT copyright 2019 American Medical Association. All rights reserved. The codes documented in this report are preliminary and upon coder review may  be revised to meet current compliance requirements. Efrain Sella MD,  MD 07/29/2021 10:32:41 AM This report has been signed electronically. Number of Addenda: 0 Note Initiated On: 07/29/2021 9:21 AM Scope Withdrawal Time: 0 hours 10 minutes 2 seconds  Total Procedure Duration: 0 hours 14 minutes 39 seconds  Estimated Blood Loss:  Estimated blood loss was minimal.      [facility]

## 2021-07-29 NOTE — Interval H&P Note (Signed)
History and Physical Interval Note:  07/29/2021 9:27 AM  Mary [name]  has presented today for surgery, with the diagnosis of anemia.  The various methods of treatment have been discussed with the patient and family. After consideration of risks, benefits and other options for treatment, the patient has consented to  Procedure(s) with comments: ESOPHAGOGASTRODUODENOSCOPY (EGD) (N/A) - DM COLONOSCOPY (N/A) as a surgical intervention.  The patient's history has been reviewed, patient examined, no change in status, stable for surgery.  I have reviewed the patient's chart and labs.  Questions were answered to the patient's satisfaction.     [place], [place]

## 2021-07-29 NOTE — Transfer of Care (Signed)
Immediate Anesthesia Transfer of Care Note  Patient: Ocie Doyne  Procedure(s) Performed: ESOPHAGOGASTRODUODENOSCOPY (EGD) COLONOSCOPY  Patient Location: Endoscopy Unit  Anesthesia Type:General  Level of Consciousness: awake, alert  and oriented  Airway & Oxygen Therapy: Patient Spontanous Breathing and Patient connected to nasal cannula oxygen  Post-op Assessment: Report given to RN and Post -op Vital signs reviewed and stable  Post vital signs: Reviewed and stable  Last Vitals:  Vitals Value Taken Time  BP    Temp    Pulse    Resp    SpO2      Last Pain:  Vitals:   07/29/21 0921  TempSrc: Temporal  PainSc: 0-No pain         Complications: No notable events documented.

## 2021-07-29 NOTE — Op Note (Signed)
[facility] Gastroenterology Patient Name: Mary [name] Procedure Date: 07/29/2021 9:21 AM MRN: [id number] Account #: [id number] Date of Birth: 07/19/1935 Admit Type: Outpatient Age: 86 Room: [facility] ENDO ROOM 2 Gender: Female Note Status: Finalized Instrument Name: Upper Endoscope [id number] Procedure:             Upper GI endoscopy Indications:           Iron deficiency anemia secondary to chronic blood loss Providers:             Lorie Apley K. Alice Reichert MD, MD Referring MD:          Dion Body (Referring MD) Medicines:             Propofol per Anesthesia Complications:         No immediate complications. Procedure:             Pre-Anesthesia Assessment:                        - The risks and benefits of the procedure and the                         sedation options and risks were discussed with the                         patient. All questions were answered and informed                         consent was obtained.                        - Patient identification and proposed procedure were                         verified prior to the procedure by the nurse. The                         procedure was verified in the procedure room.                        - ASA Grade Assessment: III - A patient with severe                         systemic disease.                        - After reviewing the risks and benefits, the patient                         was deemed in satisfactory condition to undergo the                         procedure.                        After obtaining informed consent, the endoscope was                         passed under direct vision. Throughout the procedure,  the patient's blood pressure, pulse, and oxygen                         saturations were monitored continuously. The Endoscope                         was introduced through the mouth, and advanced to the                         third part of duodenum. The upper GI  endoscopy was                         accomplished without difficulty. The patient tolerated                         the procedure well. Findings:      The Z-line was irregular and was found at the gastroesophageal junction.       Mucosa was biopsied with a cold forceps for histology. One specimen       bottle was sent to pathology.      Segmental moderate inflammation characterized by linear erosions was       found in the gastric antrum. Biopsies were taken with a cold forceps for       histology.      A 1 cm hiatal hernia was present.      The examined duodenum was normal.      The exam was otherwise without abnormality. Impression:            - Z-line irregular, at the gastroesophageal junction.                         Biopsied.                        - Gastritis. Biopsied.                        - 1 cm hiatal hernia.                        - Normal examined duodenum.                        - The examination was otherwise normal. Recommendation:        - Await pathology results.                        - Proceed with colonoscopy Procedure Code(s):     --- Professional ---                        [phone], Esophagogastroduodenoscopy, flexible,                         transoral; with biopsy, single or multiple Diagnosis Code(s):     --- Professional ---                        D50.0, Iron deficiency anemia secondary to blood loss                         (  chronic)                        K44.9, Diaphragmatic hernia without obstruction or                         gangrene                        K29.70, Gastritis, unspecified, without bleeding                        K22.8, Other specified diseases of esophagus CPT copyright 2019 American Medical Association. All rights reserved. The codes documented in this report are preliminary and upon coder review may  be revised to meet current compliance requirements. Efrain Sella MD, MD 07/29/2021 10:13:00 AM This report has been signed  electronically. Number of Addenda: 0 Note Initiated On: 07/29/2021 9:21 AM Estimated Blood Loss:  Estimated blood loss: none.      [facility]

## 2021-07-29 NOTE — Anesthesia Preprocedure Evaluation (Signed)
Anesthesia Evaluation  Patient identified by MRN, date of birth, ID band Patient awake    Reviewed: Allergy & Precautions, NPO status , Patient's Chart, lab work & pertinent test results  History of Anesthesia Complications Negative for: history of anesthetic complications  Airway Mallampati: III  TM Distance: <3 FB Neck ROM: limited    Dental  (+) Chipped, Poor Dentition, Missing   Pulmonary asthma , sleep apnea ,    Pulmonary exam normal        Cardiovascular Exercise Tolerance: Good hypertension, (-) angina+ CAD  Normal cardiovascular exam+ dysrhythmias + Valvular Problems/Murmurs      Neuro/Psych PSYCHIATRIC DISORDERS  Neuromuscular disease    GI/Hepatic Neg liver ROS, GERD  Medicated and Controlled,  Endo/Other  diabetes, Type 2Hypothyroidism   Renal/GU negative Renal ROS  negative genitourinary   Musculoskeletal  (+) Arthritis ,   Abdominal   Peds  Hematology negative hematology ROS (+)   Anesthesia Other Findings Past Medical History: No date: Acid reflux No date: Anemia No date: Arthritis No date: Asthma No date: Atherosclerosis of abdominal aorta (HCC) No date: B12 deficiency No date: Clostridium difficile infection     Comment:  H/O No date: Coronary artery disease No date: DDD (degenerative disc disease), lumbar No date: Depression No date: Diabetes ([place]) No date: Diabetic retinopathy ([place]) No date: Dysrhythmia No date: Heart murmur No date: HLD (hyperlipidemia) No date: HTN (hypertension) No date: Hypotension     Comment:  when get up too quickly No date: Hypothyroidism No date: Pulmonary nodule No date: RA (rheumatoid arthritis) ([place]) No date: Sleep apnea No date: Urinary urgency  Past Surgical History: No date: ABDOMINAL HYSTERECTOMY No date: CYSTOSCOPY 09/24/2015: HIP ARTHROPLASTY; Right     Comment:  Procedure: ARTHROPLASTY BIPOLAR HIP (HEMIARTHROPLASTY);               Surgeon:  Corky Mull, MD;  Location: [facility] ORS;  Service:              Orthopedics;  Laterality: Right; 09/29/2018: HIP ARTHROPLASTY; Left     Comment:  Procedure: ARTHROPLASTY BIPOLAR HIP (HEMIARTHROPLASTY)               LEFT;  Surgeon: Corky Mull, MD;  Location: [facility] ORS;                Service: Orthopedics;  Laterality: Left; No date: JOINT REPLACEMENT     Comment:  right hip No date: JOINT REPLACEMENT     Comment:  right knee No date: KNEE ARTHROSCOPY W/ AUTOGENOUS CARTILAGE IMPLANTATION (ACI)  PROCEDURE 12/01/2017: TOTAL KNEE ARTHROPLASTY; Left     Comment:  Procedure: TOTAL KNEE ARTHROPLASTY;  Surgeon: Corky Mull, MD;  Location: [facility] OR[facility];  Service: Orthopedics;                Laterality: Left; No date: VARICOSE VEIN SURGERY No date: VEIN SURGERY  BMI    Body Mass Index: 27.50 kg/m      Reproductive/Obstetrics negative OB ROS                             Anesthesia Physical Anesthesia Plan  ASA: 3  Anesthesia Plan: General   Post-op Pain Management:    Induction: Intravenous  PONV Risk Score and Plan: Propofol infusion and TIVA  Airway Management Planned: Natural Airway and Nasal Cannula  Additional Equipment:  Intra-op Plan:   Post-operative Plan:   Informed Consent: I have reviewed the patients History and Physical, chart, labs and discussed the procedure including the risks, benefits and alternatives for the proposed anesthesia with the patient or authorized representative who has indicated his/her understanding and acceptance.     Dental Advisory Given  Plan Discussed with: Anesthesiologist, CRNA and Surgeon  Anesthesia Plan Comments: (Patient consented for risks of anesthesia including but not limited to:  - adverse reactions to medications - risk of airway placement if required - damage to eyes, teeth, lips or other oral mucosa - nerve damage due to positioning  - sore throat or hoarseness - Damage to heart,  brain, nerves, lungs, other parts of body or loss of life  Patient voiced understanding.)        Anesthesia Quick Evaluation

## 2021-07-29 NOTE — Anesthesia Postprocedure Evaluation (Signed)
Anesthesia Post Note  Patient: Mary [name]  Procedure(s) Performed: ESOPHAGOGASTRODUODENOSCOPY (EGD) COLONOSCOPY  Patient location during evaluation: Endoscopy Anesthesia Type: General Level of consciousness: awake and alert Pain management: pain level controlled Vital Signs Assessment: post-procedure vital signs reviewed and stable Respiratory status: spontaneous breathing, nonlabored ventilation, respiratory function stable and patient connected to nasal cannula oxygen Cardiovascular status: blood pressure returned to baseline and stable Postop Assessment: no apparent nausea or vomiting Anesthetic complications: no   No notable events documented.   Last Vitals:  Vitals:   07/29/21 1034 07/29/21 1041  BP: (!) 117/42 (!) 133/54  Pulse:    Resp:    Temp:  (!) 36.3 C  SpO2:      Last Pain:  Vitals:   07/29/21 1106  TempSrc:   PainSc: 0-No pain                 Precious Haws Bently Wyss

## 2021-07-29 NOTE — H&P (Signed)
Outpatient short stay form Pre-procedure 07/29/2021 9:26 AM Sarvesh Meddaugh K. Alice Reichert, M.D.  Primary Physician: Dion Body, M.D.  Reason for visit:  Iron deficiency anemia secondary to blood loss  History of present illness:  Ms. Mary [name] presents for gastroenterology consultation requested by Dr. Tasia Catchings for iron deficiency anemia. Patient has history of chronic anemia for several years but without known cause. She does have chronic disease including type 2 diabetes mellitus acquired hypothyroidism, coronary artery disease. Patient denies any upper symptoms such as severe heartburn, dysphagia, anorexia, unexplained weight loss. She denies any lower GI symptoms such as abdominal pain, change in bowel habits, hematochezia, melena. 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 Dr. Vira Agar performed the procedure. No biopsies or other specimens were obtained.     Current Facility-Administered Medications:    0.9 %  sodium chloride infusion, , Intravenous, Continuous, Garren Greenman, Benay Pike, MD  Medications Prior to Admission  Medication Sig Dispense Refill Last Dose   acetaminophen (TYLENOL) 500 MG tablet Take 500 mg by mouth every 6 (six) hours as needed.   Past Week   amLODipine (NORVASC) 10 MG tablet Take 10 mg by mouth daily at 2 PM.    07/28/2021   aspirin EC 81 MG tablet Take 81 mg by mouth daily.   Past Week   azelastine (OPTIVAR) 0.05 % ophthalmic solution Apply to eye. Place 1 drop into both eyes 2 (two) times daily as needed   07/28/2021   busPIRone (BUSPAR) 10 MG tablet Take 20 mg by mouth 2 (two) times daily.    07/28/2021   carvedilol (COREG) 3.125 MG tablet Take 3.125 mg by mouth every other day.   07/29/2021 at 0600   ezetimibe (ZETIA) 10 MG tablet Take 10 mg by mouth daily.   07/28/2021   furosemide (LASIX) 20 MG tablet Take 20 mg by mouth daily.   Past Week    Iron-Vitamin C (VITRON-C) 65-125 MG TABS Take 1 tablet by mouth daily. 90 tablet 1 Past Week   levothyroxine (SYNTHROID, LEVOTHROID) 75 MCG tablet Take 75 mcg by mouth daily.    07/29/2021 at 0600   losartan (COZAAR) 100 MG tablet Take 100 mg by mouth daily.    07/29/2021 at 0600   metFORMIN (GLUCOPHAGE-XR) 500 MG 24 hr tablet Take 500 mg by mouth daily.    Past Week   omeprazole (PRILOSEC) 40 MG capsule Take 40 mg by mouth daily.    Past Week   PARoxetine (PAXIL) 40 MG tablet Take 40 mg by mouth every evening.   Past Week   traMADol (ULTRAM) 50 MG tablet Take 1 tablet (50 mg total) by mouth every 6 (six) hours as needed for moderate pain. 20 tablet 0 07/28/2021   traZODone (DESYREL) 50 MG tablet Take 25 mg by mouth. Take 1 tablet (50 mg total) by mouth nightly as needed for Sleep 08/26/2020  [place] 11/11/2020                AMLODIPINE BESYLATE (CALCIUM CHANNEL BLOCKERS)   Past Week   vitamin B-12 (CYANOCOBALAMIN) 500 MCG tablet Take 500 mcg by mouth daily.   07/28/2021   vitamin E 400 UNIT capsule Take 400 Units by mouth 4 (four) times a week.   07/28/2021   clindamycin (CLEOCIN) 300 MG capsule Take 300 mg by mouth 2 (two) times daily. (Patient not taking: Reported on 07/29/2021)  Completed Course   hydrALAZINE (APRESOLINE) 100 MG tablet Take 1 tablet (100 mg total) by mouth 3 (three) times daily. 90 tablet 0    hydrochlorothiazide (HYDRODIURIL) 25 MG tablet Take 1 tablet (25 mg total) by mouth daily. 30 tablet 0    meclizine (ANTIVERT) 12.5 MG tablet Take 1 tablet (12.5 mg total) by mouth 3 (three) times daily as needed for dizziness. (Patient not taking: Reported on 05/27/2021) 30 tablet 0      Allergies  Allergen Reactions   Cephalexin Hives   Nitrofurantoin     Other reaction(s): Other (See Comments) Other Reaction: measles-like lesions   Sulfa Antibiotics Hives   Atorvastatin Other (See Comments)    Muscle spasms/ muscle pain     Past Medical History:  Diagnosis Date    Acid reflux    Anemia    Arthritis    Asthma    Atherosclerosis of abdominal aorta (HCC)    B12 deficiency    Clostridium difficile infection    H/O   Coronary artery disease    DDD (degenerative disc disease), lumbar    Depression    Diabetes ([place])    Diabetic retinopathy ([place])    Dysrhythmia    Heart murmur    HLD (hyperlipidemia)    HTN (hypertension)    Hypotension    when get up too quickly   Hypothyroidism    Pulmonary nodule    RA (rheumatoid arthritis) ([place])    Sleep apnea    Urinary urgency     Review of systems:  Otherwise negative.    Physical Exam  Gen: Alert, oriented. Appears stated age.  HEENT: [place]/AT. PERRLA. Lungs: CTA, no wheezes. CV: RR nl S1, S2. Abd: soft, benign, no masses. BS+ Ext: No edema. Pulses 2+    Planned procedures: Proceed with EGD and colonoscopy. The patient understands the nature of the planned procedure, indications, risks, alternatives and potential complications including but not limited to bleeding, infection, perforation, damage to internal organs and possible oversedation/side effects from anesthesia. The patient agrees and gives consent to proceed.  Please refer to procedure notes for findings, recommendations and patient disposition/instructions.     Sandie Swayze K. Alice Reichert, M.D. Gastroenterology 07/29/2021  9:26 AM

## 2021-07-30 ENCOUNTER — Encounter: Payer: Self-pay | Admitting: Internal Medicine

## 2021-08-03 LAB — SURGICAL PATHOLOGY

## 2021-09-17 ENCOUNTER — Emergency Department: Payer: Medicare PPO

## 2021-09-17 ENCOUNTER — Observation Stay
Admission: EM | Admit: 2021-09-17 | Discharge: 2021-09-19 | Disposition: A | Discharge status: Home Health | Payer: Medicare PPO | Attending: Osteopathic Medicine | Admitting: Osteopathic Medicine

## 2021-09-17 ENCOUNTER — Other Ambulatory Visit: Payer: Self-pay

## 2021-09-17 ENCOUNTER — Observation Stay: Payer: Medicare PPO

## 2021-09-17 DIAGNOSIS — E11319 Type 2 diabetes mellitus with unspecified diabetic retinopathy without macular edema: Secondary | ICD-10-CM | POA: Diagnosis not present

## 2021-09-17 DIAGNOSIS — E119 Type 2 diabetes mellitus without complications: Secondary | ICD-10-CM

## 2021-09-17 DIAGNOSIS — I6523 Occlusion and stenosis of bilateral carotid arteries: Secondary | ICD-10-CM | POA: Insufficient documentation

## 2021-09-17 DIAGNOSIS — I7090 Unspecified atherosclerosis: Secondary | ICD-10-CM

## 2021-09-17 DIAGNOSIS — Z20822 Contact with and (suspected) exposure to covid-19: Secondary | ICD-10-CM | POA: Diagnosis not present

## 2021-09-17 DIAGNOSIS — F32A Depression, unspecified: Secondary | ICD-10-CM | POA: Diagnosis present

## 2021-09-17 DIAGNOSIS — N390 Urinary tract infection, site not specified: Secondary | ICD-10-CM | POA: Diagnosis not present

## 2021-09-17 DIAGNOSIS — R519 Headache, unspecified: Secondary | ICD-10-CM

## 2021-09-17 DIAGNOSIS — J9601 Acute respiratory failure with hypoxia: Secondary | ICD-10-CM

## 2021-09-17 DIAGNOSIS — Z7984 Long term (current) use of oral hypoglycemic drugs: Secondary | ICD-10-CM | POA: Diagnosis not present

## 2021-09-17 DIAGNOSIS — Z7982 Long term (current) use of aspirin: Secondary | ICD-10-CM | POA: Diagnosis not present

## 2021-09-17 DIAGNOSIS — Z79899 Other long term (current) drug therapy: Secondary | ICD-10-CM | POA: Diagnosis not present

## 2021-09-17 DIAGNOSIS — R7989 Other specified abnormal findings of blood chemistry: Secondary | ICD-10-CM | POA: Diagnosis not present

## 2021-09-17 DIAGNOSIS — I2510 Atherosclerotic heart disease of native coronary artery without angina pectoris: Secondary | ICD-10-CM | POA: Insufficient documentation

## 2021-09-17 DIAGNOSIS — K219 Gastro-esophageal reflux disease without esophagitis: Secondary | ICD-10-CM | POA: Diagnosis present

## 2021-09-17 DIAGNOSIS — I160 Hypertensive urgency: Principal | ICD-10-CM

## 2021-09-17 DIAGNOSIS — I509 Heart failure, unspecified: Secondary | ICD-10-CM

## 2021-09-17 DIAGNOSIS — Z96643 Presence of artificial hip joint, bilateral: Secondary | ICD-10-CM | POA: Insufficient documentation

## 2021-09-17 DIAGNOSIS — I110 Hypertensive heart disease with heart failure: Secondary | ICD-10-CM | POA: Diagnosis not present

## 2021-09-17 DIAGNOSIS — E039 Hypothyroidism, unspecified: Secondary | ICD-10-CM | POA: Diagnosis present

## 2021-09-17 DIAGNOSIS — I10 Essential (primary) hypertension: Secondary | ICD-10-CM | POA: Diagnosis present

## 2021-09-17 DIAGNOSIS — E1165 Type 2 diabetes mellitus with hyperglycemia: Secondary | ICD-10-CM | POA: Insufficient documentation

## 2021-09-17 DIAGNOSIS — Z96653 Presence of artificial knee joint, bilateral: Secondary | ICD-10-CM | POA: Insufficient documentation

## 2021-09-17 DIAGNOSIS — J45909 Unspecified asthma, uncomplicated: Secondary | ICD-10-CM | POA: Diagnosis not present

## 2021-09-17 DIAGNOSIS — I6529 Occlusion and stenosis of unspecified carotid artery: Secondary | ICD-10-CM

## 2021-09-17 LAB — URINALYSIS, COMPLETE (UACMP) WITH MICROSCOPIC
Bacteria, UA: NONE SEEN
Bilirubin Urine: NEGATIVE
Glucose, UA: NEGATIVE mg/dL
Hgb urine dipstick: NEGATIVE
Ketones, ur: NEGATIVE mg/dL
Nitrite: NEGATIVE
Protein, ur: NEGATIVE mg/dL
Specific Gravity, Urine: 1.02 (ref 1.005–1.030)
pH: 7 (ref 5.0–8.0)

## 2021-09-17 LAB — BASIC METABOLIC PANEL
Anion gap: 7 (ref 5–15)
BUN: 12 mg/dL (ref 8–23)
CO2: 30 mmol/L (ref 22–32)
Calcium: 9.8 mg/dL (ref 8.9–10.3)
Chloride: 100 mmol/L (ref 98–111)
Creatinine, Ser: 0.61 mg/dL (ref 0.44–1.00)
GFR, Estimated: >60 mL/min (ref >60)
Glucose, Bld: 136 mg/dL — ABNORMAL HIGH (ref 70–99)
Potassium: 3.7 mmol/L (ref 3.5–5.1)
Sodium: 137 mmol/L (ref 135–145)

## 2021-09-17 LAB — CBC
HCT: 41.9 % (ref 36.0–46.0)
Hemoglobin: 12.7 g/dL (ref 12.0–15.0)
MCH: 29.7 pg (ref 26.0–34.0)
MCHC: 30.3 g/dL (ref 30.0–36.0)
MCV: 97.9 fL (ref 80.0–100.0)
Platelets: 163 10*3/uL (ref 150–400)
RBC: 4.28 MIL/uL (ref 3.87–5.11)
RDW: 14.5 % (ref 11.5–15.5)
WBC: 5.5 10*3/uL (ref 4.0–10.5)
nRBC: 0 % (ref 0.0–0.2)

## 2021-09-17 LAB — BRAIN NATRIURETIC PEPTIDE: B Natriuretic Peptide: 181 pg/mL — ABNORMAL HIGH (ref 0.0–100.0)

## 2021-09-17 LAB — RESP PANEL BY RT-PCR (FLU A&B, COVID) ARPGX2
Influenza A by PCR: NEGATIVE
Influenza B by PCR: NEGATIVE
SARS Coronavirus 2 by RT PCR: NEGATIVE

## 2021-09-17 LAB — TROPONIN I (HIGH SENSITIVITY): Troponin I (High Sensitivity): 14 ng/L (ref <18)

## 2021-09-17 MED ORDER — LOSARTAN POTASSIUM 50 MG PO TABS
100.0000 mg | ORAL_TABLET | Freq: Every day | ORAL | Status: DC
  Administered 2021-09-17 – 2021-09-19 (×3): 100 mg via ORAL
  Filled 2021-09-17 (×3): qty 2

## 2021-09-17 MED ORDER — PROCHLORPERAZINE EDISYLATE 10 MG/2ML IJ SOLN
10.0000 mg | Freq: Once | INTRAMUSCULAR | Status: AC
Start: 2021-09-17 — End: 2021-09-17
  Administered 2021-09-17: 10 mg via INTRAVENOUS
  Filled 2021-09-17: qty 2

## 2021-09-17 MED ORDER — IOHEXOL 350 MG/ML SOLN
75.0000 mL | Freq: Once | INTRAVENOUS | Status: AC | PRN
  Administered 2021-09-17: 75 mL via INTRAVENOUS
  Filled 2021-09-17: qty 75

## 2021-09-17 MED ORDER — FUROSEMIDE 40 MG PO TABS: 20.0000 mg | ORAL_TABLET | Freq: Every day | ORAL | Status: DC

## 2021-09-17 MED ORDER — TRAMADOL HCL 50 MG PO TABS: 50.0000 mg | ORAL_TABLET | Freq: Four times a day (QID) | ORAL | Status: DC | PRN

## 2021-09-17 MED ORDER — SODIUM CHLORIDE 0.9% FLUSH: 3.0000 mL | INTRAVENOUS | Status: DC | PRN

## 2021-09-17 MED ORDER — HYDRALAZINE HCL 50 MG PO TABS
100.0000 mg | ORAL_TABLET | Freq: Three times a day (TID) | ORAL | Status: DC
  Administered 2021-09-17 – 2021-09-19 (×6): 100 mg via ORAL
  Filled 2021-09-17 (×7): qty 2

## 2021-09-17 MED ORDER — IOHEXOL 350 MG/ML SOLN
100.0000 mL | Freq: Once | INTRAVENOUS | Status: DC | PRN
  Filled 2021-09-17: qty 100

## 2021-09-17 MED ORDER — EZETIMIBE 10 MG PO TABS: 10.0000 mg | ORAL_TABLET | Freq: Every day | ORAL | Status: DC

## 2021-09-17 MED ORDER — TRAZODONE HCL 50 MG PO TABS
50.0000 mg | ORAL_TABLET | Freq: Every evening | ORAL | Status: DC | PRN
  Administered 2021-09-17 – 2021-09-18 (×2): 50 mg via ORAL
  Filled 2021-09-17 (×2): qty 1

## 2021-09-17 MED ORDER — BUSPIRONE HCL 5 MG PO TABS: 20.0000 mg | ORAL_TABLET | Freq: Two times a day (BID) | ORAL | Status: DC

## 2021-09-17 MED ORDER — VITAMIN B-12 1000 MCG PO TABS
500.0000 ug | ORAL_TABLET | Freq: Every day | ORAL | Status: DC
  Administered 2021-09-18 – 2021-09-19 (×2): 500 ug via ORAL
  Filled 2021-09-17 (×2): qty 0.5
  Filled 2021-09-17: qty 1

## 2021-09-17 MED ORDER — ONDANSETRON HCL 4 MG/2ML IJ SOLN: 4.0000 mg | Freq: Four times a day (QID) | INTRAMUSCULAR | Status: DC | PRN

## 2021-09-17 MED ORDER — FUROSEMIDE 10 MG/ML IJ SOLN
40.0000 mg | Freq: Once | INTRAMUSCULAR | Status: AC
  Administered 2021-09-17: 40 mg via INTRAVENOUS
  Filled 2021-09-17: qty 4

## 2021-09-17 MED ORDER — ONDANSETRON HCL 4 MG PO TABS: 4.0000 mg | ORAL_TABLET | Freq: Four times a day (QID) | ORAL | Status: DC | PRN

## 2021-09-17 MED ORDER — ASPIRIN EC 81 MG PO TBEC
81.0000 mg | DELAYED_RELEASE_TABLET | Freq: Every day | ORAL | Status: DC
  Administered 2021-09-18 – 2021-09-19 (×2): 81 mg via ORAL
  Filled 2021-09-17 (×2): qty 1

## 2021-09-17 MED ORDER — ACETAMINOPHEN 500 MG PO TABS
500.0000 mg | ORAL_TABLET | Freq: Four times a day (QID) | ORAL | Status: DC | PRN
  Administered 2021-09-17: 500 mg via ORAL
  Filled 2021-09-17: qty 1

## 2021-09-17 MED ORDER — SODIUM CHLORIDE 0.9% FLUSH
3.0000 mL | Freq: Two times a day (BID) | INTRAVENOUS | Status: DC
  Administered 2021-09-17 – 2021-09-19 (×4): 3 mL via INTRAVENOUS

## 2021-09-17 MED ORDER — SODIUM CHLORIDE 0.9 % IV SOLN: 250.0000 mL | INTRAVENOUS | Status: DC | PRN

## 2021-09-17 MED ORDER — PANTOPRAZOLE SODIUM 40 MG PO TBEC
40.0000 mg | DELAYED_RELEASE_TABLET | Freq: Every day | ORAL | Status: DC
  Administered 2021-09-18 – 2021-09-19 (×2): 40 mg via ORAL
  Filled 2021-09-17 (×2): qty 1

## 2021-09-17 MED ORDER — PAROXETINE HCL 20 MG PO TABS
40.0000 mg | ORAL_TABLET | Freq: Every evening | ORAL | Status: DC
  Administered 2021-09-18: 40 mg via ORAL
  Filled 2021-09-17 (×4): qty 2

## 2021-09-17 MED ORDER — ENOXAPARIN SODIUM 40 MG/0.4ML IJ SOSY
40.0000 mg | PREFILLED_SYRINGE | INTRAMUSCULAR | Status: DC
  Administered 2021-09-17 – 2021-09-18 (×2): 40 mg via SUBCUTANEOUS
  Filled 2021-09-17 (×2): qty 0.4

## 2021-09-17 MED ORDER — CARVEDILOL 3.125 MG PO TABS
3.1250 mg | ORAL_TABLET | ORAL | Status: DC
Start: 2021-09-17 — End: 2021-09-19
  Administered 2021-09-17 – 2021-09-19 (×2): 3.125 mg via ORAL
  Filled 2021-09-17 (×2): qty 1

## 2021-09-17 MED ORDER — LEVOTHYROXINE SODIUM 50 MCG PO TABS
75.0000 ug | ORAL_TABLET | Freq: Every day | ORAL | Status: DC
  Administered 2021-09-18 – 2021-09-19 (×2): 75 ug via ORAL
  Filled 2021-09-17 (×3): qty 1

## 2021-09-17 MED ORDER — CLONIDINE HCL 0.1 MG PO TABS
0.1000 mg | ORAL_TABLET | ORAL | Status: AC
  Administered 2021-09-17: 0.1 mg via ORAL
  Filled 2021-09-17: qty 1

## 2021-09-17 MED ORDER — FUROSEMIDE 20 MG PO TABS
20.0000 mg | ORAL_TABLET | Freq: Every day | ORAL | Status: DC
  Administered 2021-09-18 – 2021-09-19 (×2): 20 mg via ORAL
  Filled 2021-09-17 (×2): qty 1

## 2021-09-17 MED ORDER — IRON-VITAMIN C 65-125 MG PO TABS: 1.0000 | ORAL_TABLET | Freq: Every day | ORAL | Status: DC

## 2021-09-17 NOTE — ED Provider Notes (Signed)
[facility] Provider Note    Event Date/Time   First MD Initiated Contact with Patient 09/17/21 1600     (approximate)   History   Hypertension   HPI  Mary [name] is a 87 y.o. female with a past medical history of HTN, HDL, DM, asthma, arthritis, GERD, hypertension and iron deficiency anemia who presents abdomen referred from clinic where she was seen earlier today for further evaluation after she was noted in clinic to have high blood pressures with systolics of 161/09.  Patient reports that for the last 3 to 4 weeks he has had some intermittent headaches primarily posterior rating to her neck associate with some dizziness and blurry vision.  There is not any different today or yesterday compared to last 2 weeks.  She endorses some shortness of breath with exertion of the last couple of weeks.  She denies any chest pain, cough, , fevers, abdominal pain, vomiting, diarrhea, or any acute extremity weakness numbness or tingling.  She has some chronic gait instability secondary to arthritis and pain in her hip.  This is not any different today.  She denies any recent falls or injuries and has not any blood thinners.  She is compliant with all her medications including her hydralazine losartan and Coreg.  She was recently discontinued from amlodipine due to swelling in the legs about 2 weeks ago.  She states that she was also treated for UTI couple weeks ago and completed a course of antibiotics but feels that of last couple days burning and frequency has returned.  No other acute concerns at this time.      Physical Exam  Triage Vital Signs: ED Triage Vitals  Enc Vitals Group     BP 09/17/21 1517 (!) 210/126     Pulse Rate 09/17/21 1517 60     Resp 09/17/21 1517 20     Temp 09/17/21 1517 99 F (37.2 C)     Temp src --      SpO2 09/17/21 1517 92 %     Weight 09/17/21 1518 172 lb (78 kg)     Height 09/17/21 1518 5\' 6"  (1.676 m)     Head Circumference --       Peak Flow --      Pain Score 09/17/21 1518 6     Pain Loc --      Pain Edu? --      Excl. in [place]? --     Most recent vital signs: Vitals:   09/17/21 1807 09/17/21 1825  BP: (!) 212/88 (!) 212/88  Pulse:  62  Resp:  20  Temp:    SpO2:  94%    General: Awake, no distress.  CV:  Good peripheral perfusion.  No murmurs rubs or gallops.  2+ radial pulses. Resp:  Normal effort.  Clear bilaterally Abd:  No distention.  Soft throughout Other:  Cranial nerves II through XII grossly intact.  No pronator drift.  No finger dysmetria.  Symmetric 5/5 strength of all extremities.  Sensation intact to light touch in all extremities.  No specific areas of tenderness over the neck patient is forage motion.  Mild bilateral lower extremity edema.  Visual acuity is 20/30 bilaterally.  ED Results / Procedures / Treatments  Labs (all labs ordered are listed, but only abnormal results are displayed) Labs Reviewed  BASIC METABOLIC PANEL - Abnormal; Notable for the following components:      Result Value   Glucose, Bld 136 (*)  All other components within normal limits  BRAIN NATRIURETIC PEPTIDE - Abnormal; Notable for the following components:   B Natriuretic Peptide 181.0 (*)    All other components within normal limits  URINALYSIS, COMPLETE (UACMP) WITH MICROSCOPIC - Abnormal; Notable for the following components:   APPearance CLEAR (*)    Leukocytes,Ua SMALL (*)    All other components within normal limits  RESP PANEL BY RT-PCR (FLU A&B, COVID) ARPGX2  URINE CULTURE  CBC  TROPONIN I (HIGH SENSITIVITY)     EKG  ECG is remarkable for sinus rhythm with a ventricular rate of 77, PACs, nonspecific change versus artifact in inferior and lateral leads without other clear evidence of acute ischemia.   RADIOLOGY  CT head and neck ordered and reviewed by myself has no evidence of hemorrhage, aneurysm dissection or acute ischemia.  There is fairly advanced atherosclerotic changes and stenosis  as well as some fibromuscular disease.  I reviewed radiology interpretation and agree with her findings.  They note some worsening stenosis in the left ICA bulb.  No large vessel occlusion.  Chest x-ray shows some scarring in the right middle lobe and lingula which appears in prior x-rays and some aortic atherosclerosis.  There is no overt edema, effusion pneumothorax, focal consolidation or other acute process.  I also reviewed radiology's interpretation and agree with their findings.   PROCEDURES:  Critical Care performed: Yes, see critical care procedure note(s)  .Critical Care Performed by: Lucrezia Starch, MD Authorized by: Lucrezia Starch, MD   Critical care provider statement:    Critical care time (minutes):  30   Critical care was necessary to treat or prevent imminent or life-threatening deterioration of the following conditions:  Respiratory failure   Critical care was time spent personally by me on the following activities:  Development of treatment plan with patient or surrogate, discussions with consultants, evaluation of patient's response to treatment, examination of patient, ordering and review of laboratory studies, ordering and review of radiographic studies, ordering and performing treatments and interventions, pulse oximetry, re-evaluation of patient's condition and review of old charts    MEDICATIONS ORDERED IN ED: Medications  hydrALAZINE (APRESOLINE) tablet 100 mg (100 mg Oral Given 09/17/21 1805)  prochlorperazine (COMPAZINE) injection 10 mg (10 mg Intravenous Given 09/17/21 1648)  iohexol (OMNIPAQUE) 350 MG/ML injection 75 mL (75 mLs Intravenous Contrast Given 09/17/21 1700)  furosemide (LASIX) injection 40 mg (40 mg Intravenous Given 09/17/21 1805)  cloNIDine (CATAPRES) tablet 0.1 mg (0.1 mg Oral Given 09/17/21 1805)     IMPRESSION / MDM / [place] / ED COURSE  I reviewed the triage vital signs and the nursing notes.                               Differential diagnosis includes, but is not limited to, symptoms related to high blood pressure, dissection, SAH, metabolic derangements and dehydration, migraine, tension headache.  No focal deficits at this time to suggest a CVA.  CT head and neck ordered and reviewed by myself has no evidence of hemorrhage, aneurysm dissection or acute ischemia.  There is fairly advanced atherosclerotic changes and stenosis as well as some fibromuscular disease.  I reviewed radiology interpretation and agree with her findings.  They note some worsening stenosis in the left ICA bulb.  No large vessel occlusion.  Patient given some Compazine and stated her headache was much better on my assessment.  She was also  given home dose of her blood pressure medicines and we respect her blood pressure 249/97 but on recheck it is down to a systolic of 672/09 which is reasonable decline given she was at 270 earlier today and just 1 of decrease her much more acutely from that.  With regard to her shortness of breath with exertion she is on daily Lasix and has some edema in her legs.  She has not had any cough or fever and has no findings on x-ray overt edema or consolidation to suggest pneumonia.    ECG is remarkable for sinus rhythm with a ventricular rate of 77, PACs, nonspecific change versus artifact in inferior and lateral leads without other clear evidence of acute ischemia.  Nonelevated troponin not suggestive of ACS.  COVID influenza PCR is negative.  CBC otherwise without evidence of leukocytosis or acute anemia.  BMP shows no significant lecture metabolic derangements  However on trial ambulation she did decrease her oxygen to 86%.  Her BNP is slightly elevated at 181 and concern for component of volume overload driving this.  Will order 40 mg of IV Lasix and admit to hospital service for further evaluation management of this  ECG is remarkable for sinus rhythm with a ventricular rate of 77, PACs, nonspecific change  versus artifact in inferior and lateral leads without other clear evidence of acute ischemia.  I discussed patient with admitting hospitalist who will place mission orders and come see patient.         FINAL CLINICAL IMPRESSION(S) / ED DIAGNOSES   Final diagnoses:  Acute respiratory failure with hypoxia (HCC)  Hypertensive urgency  Nonintractable headache, unspecified chronicity pattern, unspecified headache type  Atherosclerosis  Stenosis of carotid artery, unspecified laterality  Elevated brain natriuretic peptide (BNP) level  Congestive heart failure, unspecified HF chronicity, unspecified heart failure type ([place])     Rx / DC Orders   ED Discharge Orders     None        Note:  This document was prepared using Dragon voice recognition software and may include unintentional dictation errors.   Lucrezia Starch, MD 09/17/21 [phone]

## 2021-09-17 NOTE — ED Triage Notes (Signed)
Pt to ED from [facility] for HTN. States she is feeling dizzy but has been for a month. Per [place], SBP>260 +shob that started a month ago.  Takes bp meds as prescribed

## 2021-09-17 NOTE — H&P (Addendum)
History and Physical    MAHATHI POKORNEY NFA:[id number] DOB: November 10, 1934 DOA: 09/17/2021  PCP: Dion Body, MD   Patient coming from: Home  I have personally briefly reviewed patient's old medical records in [place]  Chief Complaint: Shortness of breath with exertion  HPI: Mary [name] is a 87 y.o. female with medical history significant for hypertension, coronary artery disease, depression, diabetes mellitus who presents to the ER from her primary care provider's office for evaluation of uncontrolled blood pressure. Patient had gone for a scheduled follow-up visit and during her evaluation was noted to have systolic blood pressure greater than 224mmHg.  She also complained of intermittent frontal headache which she rated a 6 x 10 in intensity at its worst associated with dizziness.  She denies having any falls and denies having any tinnitus. She also complains of exertional shortness of breath for the past 1 month associated with orthopnea and lower extremity swelling.  She also complains of frequency of urination as well as dysuria but denies having any hematuria. She denies having any fever, no chills, no cough, no abdominal pain, no nausea, no vomiting, no changes in her bowel habits. Sodium 137, potassium 3.7, chloride 100, bicarb 30, glucose 136, BUN 12, creatinine 0.61, calcium 9.8, BNP 181, troponin 14, white count 5.5, hemoglobin 12.7, hematocrit 41.9, MCV 97.9, RDW 14.5, platelet count 163 Respiratory viral panel is negative Urine analysis shows small leukocyte esterase CT angiogram of the head and neck shows atherosclerotic disease at both carotid bifurcations and internal carotid artery pulse.  Severe stenosis on the right measured at 80% slightly worsened since February 2020.  60% stenosis in the left ICA bulb, similar minimally worsened since the prior exam.  Mild changes of fibromuscular disease of both cervical internal carotid arteries without pseudoaneurysm or  significant stenosis.  No intracranial large vessel occlusion or correctable proximal stenosis. Chest x-ray reviewed by me shows chronic linear scarring in the right middle lobe and lingula.  Aortic atherosclerosis. Twelve-lead EKG reviewed by me shows sinus rhythm with PVCs.     ED Course: Patient is an 87 year old female who was sent to the ER by her primary care provider for evaluation of a 1 month history of shortness of breath with exertion, orthopnea, intermittent frontal headaches and dizziness. Her blood pressure was significantly elevated during her appointment. Patient is on multiple antihypertensive medications and claims to be compliant with same. In the ER she received a dose of clonidine 0.1 mg as well as hydralazine 100 mg. She also received a dose of 40 mg IV Lasix for suspected CHF. She was ambulated in the ER and had room air pulse oximetry of 86%, at rest her pulse oximetry on room air is 94%. She will be admitted to the hospital for further evaluation.   Review of Systems: As per HPI otherwise all other systems reviewed and negative.    Past Medical History:  Diagnosis Date   Acid reflux    Anemia    Arthritis    Asthma    Atherosclerosis of abdominal aorta (HCC)    B12 deficiency    Clostridium difficile infection    H/O   Coronary artery disease    DDD (degenerative disc disease), lumbar    Depression    Diabetes ([place])    Diabetic retinopathy ([place])    Dysrhythmia    Heart murmur    HLD (hyperlipidemia)    HTN (hypertension)    Hypotension    when get up too quickly  Hypothyroidism    Pulmonary nodule    RA (rheumatoid arthritis) ([place])    Sleep apnea    Urinary urgency     Past Surgical History:  Procedure Laterality Date   ABDOMINAL HYSTERECTOMY     COLONOSCOPY N/A 07/29/2021   Procedure: COLONOSCOPY;  Surgeon: Toledo, Benay Pike, MD;  Location: [facility];  Service: Gastroenterology;  Laterality: N/A;   CYSTOSCOPY      ESOPHAGOGASTRODUODENOSCOPY N/A 07/29/2021   Procedure: ESOPHAGOGASTRODUODENOSCOPY (EGD);  Surgeon: Toledo, Benay Pike, MD;  Location: [facility] END[facility]COPY;  Service: Gastroenterology;  Laterality: N/A;  DM   HIP ARTHROPLASTY Right 09/24/2015   Procedure: ARTHROPLASTY BIPOLAR HIP (HEMIARTHROPLASTY);  Surgeon: Corky Mull, MD;  Location: [facility] ORS;  Service: Orthopedics;  Laterality: Right;   HIP ARTHROPLASTY Left 09/29/2018   Procedure: ARTHROPLASTY BIPOLAR HIP (HEMIARTHROPLASTY) LEFT;  Surgeon: Corky Mull, MD;  Location: [facility] ORS;  Service: Orthopedics;  Laterality: Left;   JOINT REPLACEMENT     right hip   JOINT REPLACEMENT     right knee   KNEE ARTHROSCOPY W/ AUTOGENOUS CARTILAGE IMPLANTATION (ACI) PROCEDURE     TOTAL KNEE ARTHROPLASTY Left 12/01/2017   Procedure: TOTAL KNEE ARTHROPLASTY;  Surgeon: Corky Mull, MD;  Location: [facility] ORS;  Service: Orthopedics;  Laterality: Left;   VARICOSE VEIN SURGERY     VEIN SURGERY       reports that she has never smoked. She has never used smokeless tobacco. She reports that she does not drink alcohol and does not use drugs.  Allergies  Allergen Reactions   Cephalexin Hives   Nitrofurantoin     Other reaction(s): Other (See Comments) Other Reaction: measles-like lesions   Sulfa Antibiotics Hives   Atorvastatin Other (See Comments)    Muscle spasms/ muscle pain    Family History  Problem Relation Age of Onset   Kidney disease Brother        also nephew   Heart disease Mother    Heart disease Father    Prostate cancer Neg Hx    Bladder Cancer Neg Hx    Breast cancer Neg Hx    Kidney cancer Neg Hx       Prior to Admission medications   Medication Sig Start Date End Date Taking? Authorizing Provider  acetaminophen (TYLENOL) 500 MG tablet Take 500 mg by mouth every 6 (six) hours as needed.    [provider]  aspirin EC 81 MG tablet Take 81 mg by mouth daily.    [provider]  azelastine (OPTIVAR) 0.05 % ophthalmic solution  Apply to eye. Place 1 drop into both eyes 2 (two) times daily as needed 11/06/20   [provider]  busPIRone (BUSPAR) 10 MG tablet Take 20 mg by mouth 2 (two) times daily.  11/25/16   [provider]  carvedilol (COREG) 3.125 MG tablet Take 3.125 mg by mouth every other day. 11/11/20 11/11/21  [provider]  ezetimibe (ZETIA) 10 MG tablet Take 10 mg by mouth daily. 07/17/19   [provider]  hydrALAZINE (APRESOLINE) 100 MG tablet Take 1 tablet (100 mg total) by mouth 3 (three) times daily. 11/18/19 05/27/21  Sidney Ace, MD  hydrochlorothiazide (HYDRODIURIL) 25 MG tablet Take 1 tablet (25 mg total) by mouth daily. 11/18/19 05/27/21  Sidney Ace, MD  Iron-Vitamin C (VITRON-C) 65-125 MG TABS Take 1 tablet by mouth daily. 02/24/21   Earlie Server, MD  levothyroxine (SYNTHROID, LEVOTHROID) 75 MCG tablet Take 75 mcg by mouth daily.  [provider]  losartan (COZAAR) 100 MG tablet Take 100 mg by mouth daily.     [provider]  meclizine (ANTIVERT) 12.5 MG tablet Take 1 tablet (12.5 mg total) by mouth 3 (three) times daily as needed for dizziness. Patient not taking: Reported on 05/27/2021 11/18/19   Sidney Ace, MD  metFORMIN (GLUCOPHAGE-XR) 500 MG 24 hr tablet Take 500 mg by mouth daily.     [provider]  omeprazole (PRILOSEC) 40 MG capsule Take 40 mg by mouth daily.     [provider]  PARoxetine (PAXIL) 40 MG tablet Take 40 mg by mouth every evening. 09/28/16   [provider]  traMADol (ULTRAM) 50 MG tablet Take 1 tablet (50 mg total) by mouth every 6 (six) hours as needed for moderate pain. 10/02/18   Saundra Shelling, MD  traZODone (DESYREL) 50 MG tablet Take 25 mg by mouth. Take 1 tablet (50 mg total) by mouth nightly as needed for Sleep 08/26/2020  [place] 11/11/2020                AMLODIPINE BESYLATE (CALCIUM CHANNEL BLOCKERS) 08/26/20   [provider]  vitamin B-12  (CYANOCOBALAMIN) 500 MCG tablet Take 500 mcg by mouth daily.    [provider]  vitamin E 400 UNIT capsule Take 400 Units by mouth 4 (four) times a week.    [provider]    Physical Exam: Vitals:   09/17/21 1518 09/17/21 1727 09/17/21 1807 09/17/21 1825  BP:  (!) 249/97 (!) 212/88 (!) 212/88  Pulse:    62  Resp:  20  20  Temp:      SpO2:  (!) 86%  94%  Weight: 78 kg     Height: 5\' 6"  (1.676 m)        Vitals:   09/17/21 1518 09/17/21 1727 09/17/21 1807 09/17/21 1825  BP:  (!) 249/97 (!) 212/88 (!) 212/88  Pulse:    62  Resp:  20  20  Temp:      SpO2:  (!) 86%  94%  Weight: 78 kg     Height: 5\' 6"  (1.676 m)         Constitutional: Alert and oriented x 3 . Not in any apparent distress HEENT:      Head: Normocephalic and atraumatic.         Eyes: PERLA, EOMI, Conjunctivae are normal. Sclera is non-icteric.       Mouth/Throat: Mucous membranes are moist.       Neck: Supple with no signs of meningismus. Cardiovascular: Regular rate and rhythm. No murmurs, gallops, or rubs. 2+ symmetrical distal pulses are present . No JVD. Trace LE edema Respiratory: Respiratory effort normal .Lungs sounds clear bilaterally. No  wheezes, crackles, or rhonchi.  Gastrointestinal: Soft, non tender, and non distended with positive bowel sounds. Central adiposity  Genitourinary: No CVA tenderness. Musculoskeletal: Nontender with normal range of motion in all extremities. No cyanosis, or erythema of extremities. Neurologic:  Face is symmetric. Moving all extremities. No gross focal neurologic deficits . Skin: Skin is warm, dry.  No rash or ulcers Psychiatric: Mood and affect are normal    Labs on Admission: I have personally reviewed following labs and imaging studies  CBC: Recent Labs  Lab 09/17/21 1517  WBC 5.5  HGB 12.7  HCT 41.9  MCV 97.9  PLT 606   Basic Metabolic Panel: Recent Labs  Lab 09/17/21 1517  NA 137  K 3.7  CL 100  CO2 30  GLUCOSE 136*  BUN  12  CREATININE 0.61  CALCIUM 9.8   GFR: Estimated Creatinine Clearance: 55.2 mL/min (by C-G formula based on SCr of 0.61 mg/dL). Liver Function Tests: No results for input(s): AST, ALT, ALKPHOS, BILITOT, PROT, ALBUMIN in the last 168 hours. No results for input(s): LIPASE, AMYLASE in the last 168 hours. No results for input(s): AMMONIA in the last 168 hours. Coagulation Profile: No results for input(s): INR, PROTIME in the last 168 hours. Cardiac Enzymes: No results for input(s): CKTOTAL, CKMB, CKMBINDEX, TROPONINI in the last 168 hours. BNP (last 3 results) No results for input(s): PROBNP in the last 8760 hours. HbA1C: No results for input(s): HGBA1C in the last 72 hours. CBG: No results for input(s): GLUCAP in the last 168 hours. Lipid Profile: No results for input(s): CHOL, HDL, LDLCALC, TRIG, CHOLHDL, LDLDIRECT in the last 72 hours. Thyroid Function Tests: No results for input(s): TSH, T4TOTAL, FREET4, T3FREE, THYROIDAB in the last 72 hours. Anemia Panel: No results for input(s): VITAMINB12, FOLATE, FERRITIN, TIBC, IRON, RETICCTPCT in the last 72 hours. Urine analysis:    Component Value Date/Time   COLORURINE YELLOW 09/17/2021 1623   APPEARANCEUR CLEAR (A) 09/17/2021 1623   APPEARANCEUR Clear 02/19/2019 1450   LABSPEC 1.020 09/17/2021 1623   LABSPEC 1.005 12/29/2013 0119   PHURINE 7.0 09/17/2021 1623   GLUCOSEU NEGATIVE 09/17/2021 1623   GLUCOSEU Negative 12/29/2013 0119   HGBUR NEGATIVE 09/17/2021 1623   BILIRUBINUR NEGATIVE 09/17/2021 1623   BILIRUBINUR Negative 02/19/2019 1450   BILIRUBINUR Negative 12/29/2013 0119   KETONESUR NEGATIVE 09/17/2021 1623   PROTEINUR NEGATIVE 09/17/2021 1623   NITRITE NEGATIVE 09/17/2021 1623   LEUKOCYTESUR SMALL (A) 09/17/2021 1623   LEUKOCYTESUR 1+ 12/29/2013 0119    Radiological Exams on Admission: CT ANGIO HEAD NECK W WO CM  Result Date: 09/17/2021 CLINICAL DATA:  Neuro deficit, acute, stroke suspected. Hypertension and  dizziness. EXAM: CT ANGIOGRAPHY HEAD AND NECK TECHNIQUE: Multidetector CT imaging of the head and neck was performed using the standard protocol during bolus administration of intravenous contrast. Multiplanar CT image reconstructions and MIPs were obtained to evaluate the vascular anatomy. Carotid stenosis measurements (when applicable) are obtained utilizing NASCET criteria, using the distal internal carotid diameter as the denominator. RADIATION DOSE REDUCTION: This exam was performed according to the departmental dose-optimization program which includes automated exposure control, adjustment of the mA and/or kV according to patient size and/or use of iterative reconstruction technique. CONTRAST:  65mL OMNIPAQUE IOHEXOL 350 MG/ML SOLN COMPARISON:  Head CT 02/19/2020.  CT angiography 09/29/2018. FINDINGS: CT HEAD FINDINGS Brain: Generalized atrophy. Chronic small-vessel ischemic changes of the cerebral hemispheric white matter. No sign of acute infarction, mass lesion, hemorrhage, hydrocephalus or extra-axial collection. Vascular: There is atherosclerotic calcification of the major vessels at the base of the brain. Skull: Negative Sinuses: Paranasal sinuses are clear. Bilateral mastoid effusions, more extensive on the left than the right. No middle ear fluid identified. Orbits: Orbits are normal. Review of the MIP images confirms the above findings CTA NECK FINDINGS Aortic arch: Aortic atherosclerosis. Innominate artery origin from the arch not included. Left common carotid artery and left subclavian artery origins widely patent. Right carotid system: Common carotid artery is tortuous but widely patent to the bifurcation. There is calcified plaque at the carotid bifurcation and ICA bulb. Minimal diameter in the ICA bulb is 1.2 mm. Compared to a more distal cervical ICA diameter of 5.2 mm, this indicates an 80% stenosis, minimally worsened since  the prior exam. Again noted is mild fibromuscular disease of the ICA.  Left carotid system: Common carotid artery is widely patent to the bifurcation. Soft and calcified plaque at the carotid bifurcation and ICA bulb. 50% ECA origin stenosis. Minimal diameter of the proximal ICA measures 2 mm. Compared to a more distal cervical ICA diameter of 5 mm, this indicates a 60% stenosis. Beyond that, the cervical internal carotid artery shows some beaded irregularity consistent with fibromuscular disease. Vertebral arteries: There is calcified plaque at the non dominant right vertebral artery origin with 30-50% stenosis. Beyond that the vessel is widely patent through the cervical region to the foramen magnum. The dominant left vertebral artery shows calcified plaque at its origin with 30% stenosis. Beyond that the vessel is widely patent through the cervical region to the foramen magnum. Skeleton: Ordinary cervical spondylosis. Other neck: No mass or lymphadenopathy. Upper chest: Mild emphysema and pulmonary scarring. No active process. Review of the MIP images confirms the above findings CTA HEAD FINDINGS Anterior circulation: Both internal carotid arteries are patent through the skull base and siphon regions. No siphon stenosis greater than 30%. The anterior and middle cerebral vessels are patent without large vessel occlusion or correctable proximal stenosis. More distal MCA branches show ordinary atherosclerotic irregularity. Posterior circulation: Both vertebral arteries are patent through the foramen magnum to the basilar. No basilar stenosis. Posterior circulation branch vessels are patent. Primary fetal origin of the right PCA. More distal PCA branches show ordinary atherosclerotic irregularity. Venous sinuses: Patent and normal. Anatomic variants: None significant. Review of the MIP images confirms the above findings IMPRESSION: Atherosclerotic disease at both carotid bifurcations and internal carotid artery bulbs. Severe stenosis on the right measured at 80%, slightly worsened since  February of 2020. 60% stenosis in the left ICA bulb, similar or minimally worsened since the prior exam. Mild changes of fibromuscular disease of both cervical internal carotid arteries without pseudoaneurysm or significant stenosis. No intracranial large vessel occlusion or correctable proximal stenosis. Distal vessel atherosclerotic irregularity diffusely. 30-50% stenosis of the right vertebral artery origin. 30% stenosis of the left vertebral artery origin. Electronically Signed   By: Nelson Chimes M.D.   On: 09/17/2021 17:24   DG Chest 2 View  Result Date: 09/17/2021 CLINICAL DATA:  Chest pain.  Hypertension. EXAM: CHEST - 2 VIEW COMPARISON:  11/17/2019 FINDINGS: Heart size is normal. Mild atherosclerosis and tortuosity of the aorta. Chronic linear scarring in the right middle lobe and lingula. No evidence of new consolidation, collapse or effusion. IMPRESSION: Chronic linear scarring in the right middle lobe and lingula. Aortic atherosclerosis and tortuosity. Electronically Signed   By: Nelson Chimes M.D.   On: 09/17/2021 15:39     Assessment/Plan Principal Problem:   Hypertensive urgency Active Problems:   Diabetes mellitus without complication (HCC)   GERD (gastroesophageal reflux disease)   Depression   Hypothyroidism   Carotid stenosis   Acute respiratory failure with hypoxia [facility])    Patient is an 87 year old female who presents to the ER from her primary care provider's office for evaluation of uncontrolled blood pressure.    Hypertensive urgency Patient on multiple antihypertensive medications and admits to being compliant with her meds. Continue hydralazine 100 mg 3 times a day as well as carvedilol 3.125 mg twice daily and Cozaar. Will uptitrate carvedilol as needed to optimize blood pressure control    Hypertensive heart disease with probable diastolic dysfunction CHF Continue carvedilol, Cozaar and furosemide Obtain 2D echocardiogram to assess LVEF  Acute  respiratory failure Unclear etiology With ambulation patient had room air pulse oximetry of 86% and at rest pulse oximetry is 94%. She also complains of shortness of breath with exertion for over a month Follow-up results of CT scan of the chest Patient may need to be assessed for home oxygen need prior to discharge.    Diabetes mellitus without complications Hold metformin Blood sugar checks before meals and at bedtime Maintain consistent carbohydrate diet    Depression Continue buspirone, Paxil and trazodone    Hypothyroidism Stable Continue Synthroid    Carotid artery stenosis Continue aspirin, carvedilol and Zetia Follow-up with vascular surgery as an outpatient    UTI Patient with complaints of frequency and dysuria and noted to have mild pyuria. Will hold off antibiotic therapy until urine culture results says she is afebrile and has no leukocytosis.   DVT prophylaxis: Lovenox  Code Status: full code  Family Communication: Greater than 50% of time was spent discussing patient's condition and plan of care with her at the bedside.  All questions and concerns have been addressed.  She verbalizes understanding and agrees with the plan.  CODE STATUS was discussed and she wishes to be a full code.  She lists her sister as her healthcare power of attorney. Disposition Plan: Back to previous home environment Consults called: none  Status: Observation    Kathern Lobosco MD [facility]     09/17/2021, 7:11 PM

## 2021-09-18 ENCOUNTER — Encounter: Payer: Self-pay | Admitting: Internal Medicine

## 2021-09-18 ENCOUNTER — Observation Stay
Admit: 2021-09-18 | Discharge: 2021-09-18 | Disposition: A | Discharge status: Home / Self Care | Payer: Medicare PPO | Attending: Internal Medicine | Admitting: Internal Medicine

## 2021-09-18 DIAGNOSIS — J9601 Acute respiratory failure with hypoxia: Secondary | ICD-10-CM | POA: Diagnosis not present

## 2021-09-18 DIAGNOSIS — I6529 Occlusion and stenosis of unspecified carotid artery: Secondary | ICD-10-CM

## 2021-09-18 DIAGNOSIS — I160 Hypertensive urgency: Secondary | ICD-10-CM | POA: Diagnosis not present

## 2021-09-18 DIAGNOSIS — I6521 Occlusion and stenosis of right carotid artery: Secondary | ICD-10-CM

## 2021-09-18 LAB — CBC
HCT: 41.5 % (ref 36.0–46.0)
Hemoglobin: 12.6 g/dL (ref 12.0–15.0)
MCH: 29.6 pg (ref 26.0–34.0)
MCHC: 30.4 g/dL (ref 30.0–36.0)
MCV: 97.4 fL (ref 80.0–100.0)
Platelets: 144 10*3/uL — ABNORMAL LOW (ref 150–400)
RBC: 4.26 MIL/uL (ref 3.87–5.11)
RDW: 14.6 % (ref 11.5–15.5)
WBC: 5.4 10*3/uL (ref 4.0–10.5)
nRBC: 0 % (ref 0.0–0.2)

## 2021-09-18 LAB — CBG MONITORING, ED
Glucose-Capillary: 140 mg/dL — ABNORMAL HIGH (ref 70–99)
Glucose-Capillary: 133 mg/dL — ABNORMAL HIGH (ref 70–99)

## 2021-09-18 LAB — BASIC METABOLIC PANEL
Anion gap: 9 (ref 5–15)
BUN: 13 mg/dL (ref 8–23)
CO2: 33 mmol/L — ABNORMAL HIGH (ref 22–32)
Calcium: 9.4 mg/dL (ref 8.9–10.3)
Chloride: 97 mmol/L — ABNORMAL LOW (ref 98–111)
Creatinine, Ser: 0.6 mg/dL (ref 0.44–1.00)
GFR, Estimated: >60 mL/min (ref >60)
Glucose, Bld: 128 mg/dL — ABNORMAL HIGH (ref 70–99)
Potassium: 3.4 mmol/L — ABNORMAL LOW (ref 3.5–5.1)
Sodium: 139 mmol/L (ref 135–145)

## 2021-09-18 LAB — ECHOCARDIOGRAM COMPLETE
AR max vel: 2.04 cm2
AV Area VTI: 2.08 cm2
AV Area mean vel: 2.07 cm2
AV Mean grad: 4 mmHg
AV Peak grad: 6.8 mmHg
Ao pk vel: 1.31 m/s
Area-P 1/2: 1.96 cm2
Height: 66 in
MV VTI: 1.69 cm2
S' Lateral: 2.4 cm
Weight: 2752 oz

## 2021-09-18 LAB — GLUCOSE, CAPILLARY: Glucose-Capillary: 139 mg/dL — ABNORMAL HIGH (ref 70–99)

## 2021-09-18 LAB — URINE CULTURE: Culture: <10000 — AB

## 2021-09-18 LAB — MAGNESIUM: Magnesium: 1.9 mg/dL (ref 1.7–2.4)

## 2021-09-18 MED ORDER — POTASSIUM CHLORIDE CRYS ER 20 MEQ PO TBCR
40.0000 meq | EXTENDED_RELEASE_TABLET | Freq: Once | ORAL | Status: AC
  Administered 2021-09-18: 40 meq via ORAL
  Filled 2021-09-18: qty 2

## 2021-09-18 MED ORDER — HYDROCHLOROTHIAZIDE 25 MG PO TABS
25.0000 mg | ORAL_TABLET | Freq: Every day | ORAL | Status: DC
  Administered 2021-09-18 – 2021-09-19 (×2): 25 mg via ORAL
  Filled 2021-09-18 (×2): qty 1

## 2021-09-18 MED ORDER — FUROSEMIDE 10 MG/ML IJ SOLN
40.0000 mg | Freq: Once | INTRAMUSCULAR | Status: AC
  Administered 2021-09-18: 40 mg via INTRAVENOUS
  Filled 2021-09-18: qty 4

## 2021-09-18 MED ORDER — AMLODIPINE BESYLATE 10 MG PO TABS
10.0000 mg | ORAL_TABLET | Freq: Every day | ORAL | Status: DC
  Administered 2021-09-18 – 2021-09-19 (×2): 10 mg via ORAL
  Filled 2021-09-18: qty 2
  Filled 2021-09-18: qty 1

## 2021-09-18 NOTE — Progress Notes (Addendum)
Progress Note    Mary [name]  MEQ:[id number] DOB: 12/20/1934  DOA: 09/17/2021 PCP: Dion Body, MD      Brief Narrative:    Medical records reviewed and are as summarized below:  Mary [name] is a 87 y.o. female with medical history significant for right carotid artery stenosis (60-79 percent, evaluated by vascular surgeon on 01/18/2019), CAD, depression, type 2 diabetes mellitus, hypertension, was referred from his PCPs office to the emergency room because of severe hypertension with systolic BP greater than 297.  She complained of shortness of breath.  BMP was mildly elevated at 181.  She was admitted to the hospital for hypertensive urgency.  She was found to have acute hypoxemic respiratory failure and acute on chronic diastolic CHF was suspected.  She was given IV Lasix and placed on oxygen via nasal cannula.  She was also found to have severe right ICA stenosis (80% on CTA neck).  Dr. Lucky Cowboy, vascular surgeon, was consulted and he recommended outpatient follow-up for carotid endarterectomy.   Assessment/Plan:   Principal Problem:   Hypertensive urgency Active Problems:   Diabetes mellitus without complication (HCC)   GERD (gastroesophageal reflux disease)   Depression   Hypothyroidism   Carotid stenosis   Acute respiratory failure with hypoxia (HCC)    Body mass index is 27.76 kg/m.   Hypertensive urgency: BP has improved.  Continue antihypertensives.  She says she has been under stress lately.  She has a daughter with cerebral palsy at home and she has not seen her son over left the home for 12 years.  Sometimes these issues get to her and causes her blood pressure to go up when she is very stressed.  Acute hypoxic respiratory failure: She could not be weaned off of oxygen today.  Oxygen saturation dropped to 86% on room air with ambulation.  Give another dose of IV Lasix tonight and will check pulse oximetry on room air with ambulation tomorrow.  She may  need home oxygen if hypoxia fails to improve  Probable acute on chronic diastolic CHF: BNP was 989.  Repeat IV Lasix tonight.  2D echo is pending.  Consulted cardiologist, Dr. Wendie Simmer, for preoperative clearance for planned right carotid endarterectomy.  Severe right ICA stenosis (80%): Consulted Dr. Lucky Cowboy, vascular surgeon, for further evaluation.  He is planning carotid endarterectomy.  He will see in the office in 1 to 2 weeks to schedule the procedure.  Other comorbidities include type II DM, depression, hypothyroidism, asthma    Diet Order             Diet heart healthy/carb modified Room service appropriate? Yes; Fluid consistency: Thin  Diet effective now                      Consultants: Vascular surgeon  Procedures: None    Medications:    amLODipine  10 mg Oral Daily   aspirin EC  81 mg Oral Daily   carvedilol  3.125 mg Oral QODAY   enoxaparin (LOVENOX) injection  40 mg Subcutaneous Q24H   furosemide  20 mg Oral Daily   hydrALAZINE  100 mg Oral TID   hydrochlorothiazide  25 mg Oral Daily   levothyroxine  75 mcg Oral Daily   losartan  100 mg Oral Daily   pantoprazole  40 mg Oral Daily   PARoxetine  40 mg Oral QPM   sodium chloride flush  3 mL Intravenous Q12H   vitamin B-12  500  mcg Oral Daily   Continuous Infusions:  sodium chloride       Anti-infectives (From admission, onward)    None              Family Communication/Anticipated D/C date and plan/Code Status   DVT prophylaxis: enoxaparin (LOVENOX) injection 40 mg Start: 09/17/21 2200     Code Status: Full Code  Family Communication: None Disposition Plan: Plan to discharge home tomorrow   Status is: Observation  The patient will require care spanning > 2 midnights and should be moved to inpatient because: IV Lasix, hypoxia          Subjective:   C/o right-sided headache that has been going on for some time.  No dizziness, palpitations, chest pain or shortness of  breath.  Objective:    Vitals:   09/18/21 1100 09/18/21 1250 09/18/21 1300 09/18/21 1500  BP: (!) 149/76   124/85  Pulse: (!) 49  (!) 111 82  Resp: 20     Temp: 98.3 F (36.8 C)   98.4 F (36.9 C)  TempSrc: Oral   Oral  SpO2: 90% (!) 86% 92% 90%  Weight:      Height:       No data found.   Intake/Output Summary (Last 24 hours) at 09/18/2021 1723 Last data filed at 09/18/2021 1713 Gross per 24 hour  Intake --  Output 2000 ml  Net -2000 ml   Filed Weights   09/17/21 1518  Weight: 78 kg    Exam:  GEN: NAD SKIN: No rash EYES: EOMI ENT: MMM CV: RRR PULM: CTA B ABD: soft, ND, NT, +BS CNS: AAO x 3, non focal EXT: No edema or tenderness        Data Reviewed:   I have personally reviewed following labs and imaging studies:  Labs: Labs show the following:   Basic Metabolic Panel: Recent Labs  Lab 09/17/21 1517 09/18/21 0710  NA 137 139  K 3.7 3.4*  CL 100 97*  CO2 30 33*  GLUCOSE 136* 128*  BUN 12 13  CREATININE 0.61 0.60  CALCIUM 9.8 9.4  MG  --  1.9   GFR Estimated Creatinine Clearance: 55.2 mL/min (by C-G formula based on SCr of 0.6 mg/dL). Liver Function Tests: No results for input(s): AST, ALT, ALKPHOS, BILITOT, PROT, ALBUMIN in the last 168 hours. No results for input(s): LIPASE, AMYLASE in the last 168 hours. No results for input(s): AMMONIA in the last 168 hours. Coagulation profile No results for input(s): INR, PROTIME in the last 168 hours.  CBC: Recent Labs  Lab 09/17/21 1517 09/18/21 0710  WBC 5.5 5.4  HGB 12.7 12.6  HCT 41.9 41.5  MCV 97.9 97.4  PLT 163 144*   Cardiac Enzymes: No results for input(s): CKTOTAL, CKMB, CKMBINDEX, TROPONINI in the last 168 hours. BNP (last 3 results) No results for input(s): PROBNP in the last 8760 hours. CBG: Recent Labs  Lab 09/18/21 0103 09/18/21 0816  GLUCAP 140* 133*   D-Dimer: No results for input(s): DDIMER in the last 72 hours. Hgb A1c: No results for input(s): HGBA1C in  the last 72 hours. Lipid Profile: No results for input(s): CHOL, HDL, LDLCALC, TRIG, CHOLHDL, LDLDIRECT in the last 72 hours. Thyroid function studies: No results for input(s): TSH, T4TOTAL, T3FREE, THYROIDAB in the last 72 hours.  Invalid input(s): FREET3 Anemia work up: No results for input(s): VITAMINB12, FOLATE, FERRITIN, TIBC, IRON, RETICCTPCT in the last 72 hours. Sepsis Labs: Recent Labs  Lab 09/17/21 1517  09/18/21 0710  WBC 5.5 5.4    Microbiology Recent Results (from the past 240 hour(s))  Urine Culture     Status: Abnormal   Collection Time: 09/17/21  4:23 PM   Specimen: Urine, Clean Catch  Result Value Ref Range Status   Specimen Description   Final    URINE, CLEAN CATCH Performed at [facility], [address], [place], [place] [address]    Special Requests   Final    NONE Performed at [facility], [address] [place], [place] [address]    Culture (A)  Final    <10,000 COLONIES/mL INSIGNIFICANT GROWTH Performed at [facility] [address] [place], [place] [address]    Report Status 09/18/2021 FINAL  Final  Resp Panel by RT-PCR (Flu A&B, Covid) Nasopharyngeal Swab     Status: None   Collection Time: 09/17/21  5:38 PM   Specimen: Nasopharyngeal Swab; Nasopharyngeal(NP) swabs in vial transport medium  Result Value Ref Range Status   SARS Coronavirus 2 by RT PCR NEGATIVE NEGATIVE Final    Comment: (NOTE) SARS-CoV-2 target nucleic acids are NOT DETECTED.  The SARS-CoV-2 RNA is generally detectable in upper respiratory specimens during the acute phase of infection. The lowest concentration of SARS-CoV-2 viral copies this assay can detect is 138 copies/mL. A negative result does not preclude SARS-Cov-2 infection and should not be used as the sole basis for treatment or other patient management decisions. A negative result may occur with  improper specimen collection/handling, submission of specimen other than nasopharyngeal  swab, presence of viral mutation(s) within the areas targeted by this assay, and inadequate number of viral copies(<138 copies/mL). A negative result must be combined with clinical observations, patient history, and epidemiological information. The expected result is Negative.  Fact Sheet for Patients:  EntrepreneurPulse.com.au  Fact Sheet for Healthcare Providers:  IncredibleEmployment.be  This test is no t yet approved or cleared by the [place] FDA and  has been authorized for detection and/or diagnosis of SARS-CoV-2 by FDA under an Emergency Use Authorization (EUA). This EUA will remain  in effect (meaning this test can be used) for the duration of the COVID-19 declaration under Section 564(b)(1) of the Act, 21 U.S.C.section 360bbb-3(b)(1), unless the authorization is terminated  or revoked sooner.       Influenza A by PCR NEGATIVE NEGATIVE Final   Influenza B by PCR NEGATIVE NEGATIVE Final    Comment: (NOTE) The Xpert Xpress SARS-CoV-2/FLU/RSV plus assay is intended as an aid in the diagnosis of influenza from Nasopharyngeal swab specimens and should not be used as a sole basis for treatment. Nasal washings and aspirates are unacceptable for Xpert Xpress SARS-CoV-2/FLU/RSV testing.  Fact Sheet for Patients: EntrepreneurPulse.com.au  Fact Sheet for Healthcare Providers: IncredibleEmployment.be  This test is not yet approved or cleared by the [place] FDA and has been authorized for detection and/or diagnosis of SARS-CoV-2 by FDA under an Emergency Use Authorization (EUA). This EUA will remain in effect (meaning this test can be used) for the duration of the COVID-19 declaration under Section 564(b)(1) of the Act, 21 U.S.C. section 360bbb-3(b)(1), unless the authorization is terminated or revoked.  Performed at [facility], [address] [place],  [address]      Procedures and diagnostic studies:  CT ANGIO HEAD NECK W WO CM  Result Date: 09/17/2021 CLINICAL DATA:  Neuro deficit, acute, stroke suspected. Hypertension and dizziness. EXAM: CT ANGIOGRAPHY HEAD AND NECK TECHNIQUE: Multidetector CT imaging of the head  and neck was performed using the standard protocol during bolus administration of intravenous contrast. Multiplanar CT image reconstructions and MIPs were obtained to evaluate the vascular anatomy. Carotid stenosis measurements (when applicable) are obtained utilizing NASCET criteria, using the distal internal carotid diameter as the denominator. RADIATION DOSE REDUCTION: This exam was performed according to the departmental dose-optimization program which includes automated exposure control, adjustment of the mA and/or kV according to patient size and/or use of iterative reconstruction technique. CONTRAST:  59mL OMNIPAQUE IOHEXOL 350 MG/ML SOLN COMPARISON:  Head CT 02/19/2020.  CT angiography 09/29/2018. FINDINGS: CT HEAD FINDINGS Brain: Generalized atrophy. Chronic small-vessel ischemic changes of the cerebral hemispheric white matter. No sign of acute infarction, mass lesion, hemorrhage, hydrocephalus or extra-axial collection. Vascular: There is atherosclerotic calcification of the major vessels at the base of the brain. Skull: Negative Sinuses: Paranasal sinuses are clear. Bilateral mastoid effusions, more extensive on the left than the right. No middle ear fluid identified. Orbits: Orbits are normal. Review of the MIP images confirms the above findings CTA NECK FINDINGS Aortic arch: Aortic atherosclerosis. Innominate artery origin from the arch not included. Left common carotid artery and left subclavian artery origins widely patent. Right carotid system: Common carotid artery is tortuous but widely patent to the bifurcation. There is calcified plaque at the carotid bifurcation and ICA bulb. Minimal diameter in the ICA bulb is 1.2 mm. Compared to a  more distal cervical ICA diameter of 5.2 mm, this indicates an 80% stenosis, minimally worsened since the prior exam. Again noted is mild fibromuscular disease of the ICA. Left carotid system: Common carotid artery is widely patent to the bifurcation. Soft and calcified plaque at the carotid bifurcation and ICA bulb. 50% ECA origin stenosis. Minimal diameter of the proximal ICA measures 2 mm. Compared to a more distal cervical ICA diameter of 5 mm, this indicates a 60% stenosis. Beyond that, the cervical internal carotid artery shows some beaded irregularity consistent with fibromuscular disease. Vertebral arteries: There is calcified plaque at the non dominant right vertebral artery origin with 30-50% stenosis. Beyond that the vessel is widely patent through the cervical region to the foramen magnum. The dominant left vertebral artery shows calcified plaque at its origin with 30% stenosis. Beyond that the vessel is widely patent through the cervical region to the foramen magnum. Skeleton: Ordinary cervical spondylosis. Other neck: No mass or lymphadenopathy. Upper chest: Mild emphysema and pulmonary scarring. No active process. Review of the MIP images confirms the above findings CTA HEAD FINDINGS Anterior circulation: Both internal carotid arteries are patent through the skull base and siphon regions. No siphon stenosis greater than 30%. The anterior and middle cerebral vessels are patent without large vessel occlusion or correctable proximal stenosis. More distal MCA branches show ordinary atherosclerotic irregularity. Posterior circulation: Both vertebral arteries are patent through the foramen magnum to the basilar. No basilar stenosis. Posterior circulation branch vessels are patent. Primary fetal origin of the right PCA. More distal PCA branches show ordinary atherosclerotic irregularity. Venous sinuses: Patent and normal. Anatomic variants: None significant. Review of the MIP images confirms the above  findings IMPRESSION: Atherosclerotic disease at both carotid bifurcations and internal carotid artery bulbs. Severe stenosis on the right measured at 80%, slightly worsened since February of 2020. 60% stenosis in the left ICA bulb, similar or minimally worsened since the prior exam. Mild changes of fibromuscular disease of both cervical internal carotid arteries without pseudoaneurysm or significant stenosis. No intracranial large vessel occlusion or correctable proximal stenosis. Distal vessel atherosclerotic  irregularity diffusely. 30-50% stenosis of the right vertebral artery origin. 30% stenosis of the left vertebral artery origin. Electronically Signed   By: Nelson Chimes M.D.   On: 09/17/2021 17:24   DG Chest 2 View  Result Date: 09/17/2021 CLINICAL DATA:  Chest pain.  Hypertension. EXAM: CHEST - 2 VIEW COMPARISON:  11/17/2019 FINDINGS: Heart size is normal. Mild atherosclerosis and tortuosity of the aorta. Chronic linear scarring in the right middle lobe and lingula. No evidence of new consolidation, collapse or effusion. IMPRESSION: Chronic linear scarring in the right middle lobe and lingula. Aortic atherosclerosis and tortuosity. Electronically Signed   By: Nelson Chimes M.D.   On: 09/17/2021 15:39   CT CHEST WO CONTRAST  Result Date: 09/17/2021 CLINICAL DATA:  Pneumonia. EXAM: CT CHEST WITHOUT CONTRAST TECHNIQUE: Multidetector CT imaging of the chest was performed following the standard protocol without IV contrast. RADIATION DOSE REDUCTION: This exam was performed according to the departmental dose-optimization program which includes automated exposure control, adjustment of the mA and/or kV according to patient size and/or use of iterative reconstruction technique. COMPARISON:  Chest radiograph dated 09/17/2021 and CT dated 11/16/2019. FINDINGS: Evaluation of this exam is limited in the absence of intravenous contrast. Cardiovascular: Borderline cardiomegaly. No pericardial effusion. There is  coronary vascular calcification. Moderate atherosclerotic calcification of the thoracic aorta. There is dilatation of the main pulmonary trunk suggestive of pulmonary hypertension. Mediastinum/Nodes: No hilar or mediastinal adenopathy. The esophagus is grossly unremarkable. No mediastinal fluid collection. Lungs/Pleura: Linear atelectasis/scarring of the right middle lobe and lingula. There is a 7 mm nodule in the right middle lobe with lobulated margins (87/3) similar to prior CT and stable dating back to 2014. Additional nodule in the right middle lobe measures 6 mm (81/3) and similar to prior CT of 11/16/2019. No focal consolidation, pleural effusion, pneumothorax. The central airways are patent. Upper Abdomen: No acute abnormality. Musculoskeletal: Osteopenia with degenerative changes of the spine. No acute osseous pathology. IMPRESSION: 1. No acute intrathoracic pathology. 2. Stable right middle lobe pulmonary nodules. 3. Dilated main pulmonary trunk suggestive of pulmonary hypertension. 4. Aortic Atherosclerosis (ICD10-I70.0). Electronically Signed   By: Anner Crete M.D.   On: 09/17/2021 20:55               LOS: 0 days   Maxxwell Edgett  [facility]   Pager on [email or web address]. If 7PM-7AM, please contact night-coverage at [email or web address]     09/18/2021, 5:23 PM

## 2021-09-18 NOTE — Plan of Care (Signed)

## 2021-09-18 NOTE — TOC Initial Note (Signed)
Transition of Care [facility]) - Initial/Assessment Note    Patient Details  Name: Mary [name] MRN: [id number] Date of Birth: 04-24-1935  [facility] CM/SW Contact:    Shelbie Hutching, RN Phone Number: 09/18/2021, 4:07 PM  Clinical Narrative:                 Patient placed under observation for hypertensive urgency.  RNCM met with patient at the beside and reviewed MOON.  Patient is from home her daughter lives with her and has CP so she requires assistance with caring for her.  She has caregivers that come in in the mornings to help her get her daughter up and then at night when she can get someone to come out and help.  Patient's sister is staying with the daughter now while she is in the hospital.   Patient walks with a cane or a walker, she drives.  She is current with her PCP.    TOC will follow.   Expected Discharge Plan: [place] Barriers to Discharge: Continued Medical Work up   Patient Goals and CMS Choice Patient states their goals for this hospitalization and ongoing recovery are:: to get back home      Expected Discharge Plan and Services Expected Discharge Plan: [place]       Living arrangements for the past 2 months: Single Family Home                 DME Arranged: N/A DME Agency: NA                  Prior Living Arrangements/Services Living arrangements for the past 2 months: Single Family Home Lives with:: Adult Children Patient language and need for interpreter reviewed:: Yes Do you feel safe going back to the place where you live?: Yes      Need for Family Participation in Patient Care: Yes (Comment) Care giver support system in place?: Yes (comment) (sister) Current home services: DME (cane and walker) Criminal Activity/Legal Involvement Pertinent to Current Situation/Hospitalization: No - Comment as needed  Activities of Daily Living Home Assistive Devices/Equipment: Blood pressure cuff, CBG  Meter ADL Screening (condition at time of admission) Patient's cognitive ability adequate to safely complete daily activities?: Yes Is the patient deaf or have difficulty hearing?: No Does the patient have difficulty seeing, even when wearing glasses/contacts?: No Does the patient have difficulty concentrating, remembering, or making decisions?: No Patient able to express need for assistance with ADLs?: Yes Does the patient have difficulty dressing or bathing?: No Independently performs ADLs?: Yes (appropriate for developmental age) Does the patient have difficulty walking or climbing stairs?: Yes Weakness of Legs: None Weakness of Arms/Hands: None  Permission Sought/Granted Permission sought to share information with : Case Manager, Family Supports Permission granted to share information with : Yes, Verbal Permission Granted  Share Information with NAME: Makaia Rappa     Permission granted to share info w Relationship: daughter  Permission granted to share info w Contact Information: [phone]  Emotional Assessment Appearance:: Appears stated age Attitude/Demeanor/Rapport: Engaged Affect (typically observed): Accepting Orientation: : Oriented to Self, Oriented to Place, Oriented to  Time, Oriented to Situation Alcohol / Substance Use: Not Applicable Psych Involvement: No (comment)  Admission diagnosis:  Hypertensive urgency [I16.0] Patient Active Problem List   Diagnosis Date Noted   IDA (iron deficiency anemia) 02/02/2021   Asthma    Acute respiratory failure with hypoxia ([place])  Acute lower UTI 02/18/2019   Carotid stenosis 01/18/2019   History of fracture of left hip 10/26/2018   Hip fracture ([place]) 09/28/2018   Dizziness 09/28/2018   History of normocytic normochromic anemia 06/13/2018   Status post total knee replacement using cement, left 12/01/2017   Synovial cyst of left popliteal space 05/27/2017   Hypertensive urgency 05/19/2017   Diabetic polyneuropathy  associated with type 2 diabetes mellitus ([place]) 03/30/2017   Anxiety 05/19/2016   Insomnia 05/19/2016   Pure hypercholesterolemia 05/19/2016   Personal history of disease 03/11/2016   Status post hip hemiarthroplasty 10/10/2015   Hypoxia 09/26/2015   Hypoxemia 09/26/2015   Fracture of femoral neck, right ([place]) 09/23/2015   Diabetes mellitus without complication ([place]) 71/25/2712   HTN (hypertension) 09/23/2015   HLD (hyperlipidemia) 09/23/2015   GERD (gastroesophageal reflux disease) 09/23/2015   Depression 09/23/2015   Hypothyroidism 09/23/2015   DDD (degenerative disc disease), lumbar 08/26/2015   Lumbar stenosis with neurogenic claudication 08/26/2015   Nocturia 04/29/2015   Urinary frequency 04/29/2015   Atrophic vaginitis 04/29/2015   Mixed incontinence 06/28/2014   Lumbar radiculopathy 02/08/2013   PCP:  Dion Body, MD Pharmacy:   [facility], [place] [address] Phone: [phone] Fax: [phone]     Social Determinants of Health (SDOH) Interventions    Readmission Risk Interventions No flowsheet data found.

## 2021-09-18 NOTE — Care Management Obs Status (Signed)
[place] NOTIFICATION   Patient Details  Name: MARGUETTA WINDISH MRN: [id number] Date of Birth: 1935-03-16   Medicare Observation Status Notification Given:  Yes    Shelbie Hutching, RN 09/18/2021, 3:58 PM

## 2021-09-18 NOTE — Consult Note (Signed)
[facility] Vascular Consult Note  MRN : [id number]  Mary [name] is a 87 y.o. (October 30, 1934) female who presents with chief complaint of  Chief Complaint  Patient presents with   Hypertension  .  History of Present Illness: I am asked to see the patient by Dr. Mal Misty for carotid stenosis. Patient is admitted to the hospital for hypertensive crisis.  She was confused and weak and had markedly elevated blood pressures.  She is feeling much better now.  She did not have focal unilateral arm or leg weakness or numbness.  No speech or swallowing difficulty.  No facial droop.  She underwent a CT angiogram which I have independently reviewed which is reported as an 80% right ICA stenosis and a 60% left ICA stenosis.  I would generally agree with these findings.  Of note, she also has significant tortuosity in the carotid system as well as what likely appears to be a type III aortic arch although this is incompletely visualized on the CT scan.  She feels okay today.  She had some knowledge of having carotid disease and in looking back she had more moderate disease in years past.  Current Facility-Administered Medications  Medication Dose Route Frequency Provider Last Rate Last Admin   0.9 %  sodium chloride infusion  250 mL Intravenous PRN Agbata, Tochukwu, MD       acetaminophen (TYLENOL) tablet 500 mg  500 mg Oral Q6H PRN Agbata, Tochukwu, MD   500 mg at 09/17/21 2200   amLODipine (NORVASC) tablet 10 mg  10 mg Oral Daily Jennye Boroughs, MD   10 mg at 09/18/21 1532   aspirin EC tablet 81 mg  81 mg Oral Daily Agbata, Tochukwu, MD   81 mg at 09/18/21 0847   carvedilol (COREG) tablet 3.125 mg  3.125 mg Oral QODAY Agbata, Tochukwu, MD   3.125 mg at 09/17/21 2005   enoxaparin (LOVENOX) injection 40 mg  40 mg Subcutaneous Q24H Agbata, Tochukwu, MD   40 mg at 09/17/21 2201   furosemide (LASIX) tablet 20 mg  20 mg Oral Daily Agbata, Tochukwu, MD   20 mg at 09/18/21 0847   hydrALAZINE  (APRESOLINE) tablet 100 mg  100 mg Oral TID Lucrezia Starch, MD   100 mg at 09/18/21 1532   hydrochlorothiazide (HYDRODIURIL) tablet 25 mg  25 mg Oral Daily Jennye Boroughs, MD       iohexol (OMNIPAQUE) 350 MG/ML injection 100 mL  100 mL Intravenous Once PRN Agbata, Tochukwu, MD       levothyroxine (SYNTHROID) tablet 75 mcg  75 mcg Oral Daily Agbata, Tochukwu, MD   75 mcg at 09/18/21 9373   losartan (COZAAR) tablet 100 mg  100 mg Oral Daily Agbata, Tochukwu, MD   100 mg at 09/18/21 0847   ondansetron (ZOFRAN) tablet 4 mg  4 mg Oral Q6H PRN Agbata, Tochukwu, MD       Or   ondansetron (ZOFRAN) injection 4 mg  4 mg Intravenous Q6H PRN Agbata, Tochukwu, MD       pantoprazole (PROTONIX) EC tablet 40 mg  40 mg Oral Daily Agbata, Tochukwu, MD   40 mg at 09/18/21 0847   PARoxetine (PAXIL) tablet 40 mg  40 mg Oral QPM Agbata, Tochukwu, MD       sodium chloride flush (NS) 0.9 % injection 3 mL  3 mL Intravenous Q12H Agbata, Tochukwu, MD   3 mL at 09/18/21 0850   sodium chloride flush (NS) 0.9 % injection 3  mL  3 mL Intravenous PRN Agbata, Tochukwu, MD       traMADol (ULTRAM) tablet 50 mg  50 mg Oral Q6H PRN Agbata, Tochukwu, MD       traZODone (DESYREL) tablet 50 mg  50 mg Oral QHS PRN Agbata, Tochukwu, MD   50 mg at 09/17/21 2217   vitamin B-12 (CYANOCOBALAMIN) tablet 500 mcg  500 mcg Oral Daily Agbata, Tochukwu, MD   500 mcg at 09/18/21 9201   Current Outpatient Medications  Medication Sig Dispense Refill   aspirin EC 81 MG tablet Take 81 mg by mouth daily.     azelastine (OPTIVAR) 0.05 % ophthalmic solution Place 1 drop into both eyes daily as needed (allergies).     busPIRone (BUSPAR) 10 MG tablet Take 20 mg by mouth 2 (two) times daily.      carvedilol (COREG) 3.125 MG tablet Take 3.125 mg by mouth every other day.     ezetimibe (ZETIA) 10 MG tablet Take 10 mg by mouth daily.     hydrALAZINE (APRESOLINE) 100 MG tablet Take 1 tablet (100 mg total) by mouth 3 (three) times daily. 90 tablet 0    hydrochlorothiazide (HYDRODIURIL) 25 MG tablet Take 1 tablet (25 mg total) by mouth daily. 30 tablet 0   Iron-Vitamin C (VITRON-C) 65-125 MG TABS Take 1 tablet by mouth daily. 90 tablet 1   levothyroxine (SYNTHROID, LEVOTHROID) 75 MCG tablet Take 75 mcg by mouth daily.      losartan (COZAAR) 100 MG tablet Take 100 mg by mouth daily.      metFORMIN (GLUCOPHAGE-XR) 500 MG 24 hr tablet Take 500 mg by mouth daily.      omeprazole (PRILOSEC) 40 MG capsule Take 40 mg by mouth daily.      PARoxetine (PAXIL) 40 MG tablet Take 40 mg by mouth every evening.     traZODone (DESYREL) 50 MG tablet Take 25 mg by mouth at bedtime.     vitamin B-12 (CYANOCOBALAMIN) 500 MCG tablet Take 500 mcg by mouth daily.     vitamin E 400 UNIT capsule Take 400 Units by mouth 4 (four) times a week.     acetaminophen (TYLENOL) 500 MG tablet Take 500 mg by mouth every 6 (six) hours as needed.      Past Medical History:  Diagnosis Date   Acid reflux    Anemia    Arthritis    Asthma    Atherosclerosis of abdominal aorta (HCC)    B12 deficiency    Clostridium difficile infection    H/O   Coronary artery disease    DDD (degenerative disc disease), lumbar    Depression    Diabetes ([place])    Diabetic retinopathy ([place])    Dysrhythmia    Heart murmur    HLD (hyperlipidemia)    HTN (hypertension)    Hypotension    when get up too quickly   Hypothyroidism    Pulmonary nodule    RA (rheumatoid arthritis) ([place])    Sleep apnea    Urinary urgency     Past Surgical History:  Procedure Laterality Date   ABDOMINAL HYSTERECTOMY     COLONOSCOPY N/A 07/29/2021   Procedure: COLONOSCOPY;  Surgeon: Toledo, Benay Pike, MD;  Location: [facility]COPY;  Service: Gastroenterology;  Laterality: N/A;   CYSTOSCOPY     ESOPHAGOGASTRODUODENOSCOPY N/A 07/29/2021   Procedure: ESOPHAGOGASTRODUODENOSCOPY (EGD);  Surgeon: Toledo, Benay Pike, MD;  Location: [facility] ENDOSCOPY;  Service: Gastroenterology;  Laterality: N/A;  DM   HIP ARTHROPLASTY  Right 09/24/2015   Procedure: ARTHROPLASTY BIPOLAR HIP (HEMIARTHROPLASTY);  Surgeon: Corky Mull, MD;  Location: [facility] ORS;  Service: Orthopedics;  Laterality: Right;   HIP ARTHROPLASTY Left 09/29/2018   Procedure: ARTHROPLASTY BIPOLAR HIP (HEMIARTHROPLASTY) LEFT;  Surgeon: Corky Mull, MD;  Location: [facility] ORS;  Service: Orthopedics;  Laterality: Left;   JOINT REPLACEMENT     right hip   JOINT REPLACEMENT     right knee   KNEE ARTHROSCOPY W/ AUTOGENOUS CARTILAGE IMPLANTATION (ACI) PROCEDURE     TOTAL KNEE ARTHROPLASTY Left 12/01/2017   Procedure: TOTAL KNEE ARTHROPLASTY;  Surgeon: Corky Mull, MD;  Location: [facility] ORS;  Service: Orthopedics;  Laterality: Left;   VARICOSE VEIN SURGERY     VEIN SURGERY       Social History   Tobacco Use   Smoking status: Never   Smokeless tobacco: Never  Vaping Use   Vaping Use: Never used  Substance Use Topics   Alcohol use: No    Alcohol/week: 0.0 standard drinks   Drug use: No     Family History  Problem Relation Age of Onset   Kidney disease Brother        also nephew   Heart disease Mother    Heart disease Father    Prostate cancer Neg Hx    Bladder Cancer Neg Hx    Breast cancer Neg Hx    Kidney cancer Neg Hx     Allergies  Allergen Reactions   Cephalexin Hives   Nitrofurantoin     Other reaction(s): Other (See Comments) Other Reaction: measles-like lesions   Sulfa Antibiotics Hives   Atorvastatin Other (See Comments)    Muscle spasms/ muscle pain     REVIEW OF SYSTEMS (Negative unless checked)  Constitutional: [] Weight loss  [] Fever  [] Chills Cardiac: [] Chest pain   [] Chest pressure   [] Palpitations   [] Shortness of breath when laying flat   [] Shortness of breath at rest   [x] Shortness of breath with exertion. Vascular:  [] Pain in legs with walking   [] Pain in legs at rest   [] Pain in legs when laying flat   [] Claudication   [] Pain in feet when walking  [] Pain in feet at rest  [] Pain in feet when laying flat   [] History of  DVT   [] Phlebitis   [] Swelling in legs   [] Varicose veins   [] Non-healing ulcers Pulmonary:   [] Uses home oxygen   [] Productive cough   [] Hemoptysis   [] Wheeze  [] COPD   [x] Asthma Neurologic:  [x] Dizziness  [] Blackouts   [] Seizures   [] History of stroke   [] History of TIA  [] Aphasia   [] Temporary blindness   [] Dysphagia   [] Weakness or numbness in arms   [] Weakness or numbness in legs Musculoskeletal:  [x] Arthritis   [] Joint swelling   [] Joint pain   [] Low back pain Hematologic:  [] Easy bruising  [] Easy bleeding   [] Hypercoagulable state   [x] Anemic  [] Hepatitis Gastrointestinal:  [] Blood in stool   [] Vomiting blood  [x] Gastroesophageal reflux/heartburn   [] Difficulty swallowing. Genitourinary:  [] Chronic kidney disease   [] Difficult urination  [] Frequent urination  [] Burning with urination   [] Blood in urine Skin:  [] Rashes   [] Ulcers   [] Wounds Psychological:  [x] History of anxiety   []  History of major depression.  Physical Examination  Vitals:   09/18/21 0800 09/18/21 1100 09/18/21 1300 09/18/21 1500  BP: (!) 182/101 (!) 149/76  124/85  Pulse: (!) 53 (!) 49 (!) 111 82  Resp: (!) 21 20    Temp:  98.1 F (36.7 C) 98.3 F (36.8 C)  98.4 F (36.9 C)  TempSrc: Oral Oral  Oral  SpO2: 94% 90% 92% 90%  Weight:      Height:       Body mass index is 27.76 kg/m. Gen:  WD/WN, NAD Head: [place]/AT, No temporalis wasting.  Ear/Nose/Throat: Hearing grossly intact, nares w/o erythema or drainage, oropharynx w/o Erythema/Exudate Eyes: Sclera non-icteric, conjunctiva clear Neck: Trachea midline.  No JVD.  Pulmonary:  Good air movement, respirations not labored, equal bilaterally.  Cardiac: somewhat irregular Vascular:  Vessel Right Left  Radial Palpable Palpable   Musculoskeletal: M/S 5/5 throughout.  Extremities without ischemic changes.  No deformity or atrophy. No edema. Neurologic: Sensation grossly intact in extremities.  Symmetrical.  Speech is fluent. Motor exam as listed  above. Psychiatric: Judgment intact, Mood & affect appropriate for pt's clinical situation. Dermatologic: No rashes or ulcers noted.  No cellulitis or open wounds.      CBC Lab Results  Component Value Date   WBC 5.4 09/18/2021   HGB 12.6 09/18/2021   HCT 41.5 09/18/2021   MCV 97.4 09/18/2021   PLT 144 (L) 09/18/2021    BMET    Component Value Date/Time   NA 139 09/18/2021 0710   NA 140 12/30/2013 0554   K 3.4 (L) 09/18/2021 0710   K 3.6 12/30/2013 0554   CL 97 (L) 09/18/2021 0710   CL 105 12/30/2013 0554   CO2 33 (H) 09/18/2021 0710   CO2 31 12/30/2013 0554   GLUCOSE 128 (H) 09/18/2021 0710   GLUCOSE 121 (H) 12/30/2013 0554   BUN 13 09/18/2021 0710   BUN 11 12/30/2013 0554   CREATININE 0.60 09/18/2021 0710   CREATININE 0.70 12/30/2013 0554   CALCIUM 9.4 09/18/2021 0710   CALCIUM 8.6 12/30/2013 0554   GFRNONAA >60 09/18/2021 0710   GFRNONAA >60 12/30/2013 0554   GFRAA >60 02/19/2020 1618   GFRAA >60 12/30/2013 0554   Estimated Creatinine Clearance: 55.2 mL/min (by C-G formula based on SCr of 0.6 mg/dL).  COAG Lab Results  Component Value Date   INR 0.93 09/28/2018   INR 0.96 11/23/2017    Radiology CT ANGIO HEAD NECK W WO CM  Result Date: 09/17/2021 CLINICAL DATA:  Neuro deficit, acute, stroke suspected. Hypertension and dizziness. EXAM: CT ANGIOGRAPHY HEAD AND NECK TECHNIQUE: Multidetector CT imaging of the head and neck was performed using the standard protocol during bolus administration of intravenous contrast. Multiplanar CT image reconstructions and MIPs were obtained to evaluate the vascular anatomy. Carotid stenosis measurements (when applicable) are obtained utilizing NASCET criteria, using the distal internal carotid diameter as the denominator. RADIATION DOSE REDUCTION: This exam was performed according to the departmental dose-optimization program which includes automated exposure control, adjustment of the mA and/or kV according to patient size  and/or use of iterative reconstruction technique. CONTRAST:  46mL OMNIPAQUE IOHEXOL 350 MG/ML SOLN COMPARISON:  Head CT 02/19/2020.  CT angiography 09/29/2018. FINDINGS: CT HEAD FINDINGS Brain: Generalized atrophy. Chronic small-vessel ischemic changes of the cerebral hemispheric white matter. No sign of acute infarction, mass lesion, hemorrhage, hydrocephalus or extra-axial collection. Vascular: There is atherosclerotic calcification of the major vessels at the base of the brain. Skull: Negative Sinuses: Paranasal sinuses are clear. Bilateral mastoid effusions, more extensive on the left than the right. No middle ear fluid identified. Orbits: Orbits are normal. Review of the MIP images confirms the above findings CTA NECK FINDINGS Aortic arch: Aortic atherosclerosis. Innominate artery origin from the arch not included.  Left common carotid artery and left subclavian artery origins widely patent. Right carotid system: Common carotid artery is tortuous but widely patent to the bifurcation. There is calcified plaque at the carotid bifurcation and ICA bulb. Minimal diameter in the ICA bulb is 1.2 mm. Compared to a more distal cervical ICA diameter of 5.2 mm, this indicates an 80% stenosis, minimally worsened since the prior exam. Again noted is mild fibromuscular disease of the ICA. Left carotid system: Common carotid artery is widely patent to the bifurcation. Soft and calcified plaque at the carotid bifurcation and ICA bulb. 50% ECA origin stenosis. Minimal diameter of the proximal ICA measures 2 mm. Compared to a more distal cervical ICA diameter of 5 mm, this indicates a 60% stenosis. Beyond that, the cervical internal carotid artery shows some beaded irregularity consistent with fibromuscular disease. Vertebral arteries: There is calcified plaque at the non dominant right vertebral artery origin with 30-50% stenosis. Beyond that the vessel is widely patent through the cervical region to the foramen magnum. The  dominant left vertebral artery shows calcified plaque at its origin with 30% stenosis. Beyond that the vessel is widely patent through the cervical region to the foramen magnum. Skeleton: Ordinary cervical spondylosis. Other neck: No mass or lymphadenopathy. Upper chest: Mild emphysema and pulmonary scarring. No active process. Review of the MIP images confirms the above findings CTA HEAD FINDINGS Anterior circulation: Both internal carotid arteries are patent through the skull base and siphon regions. No siphon stenosis greater than 30%. The anterior and middle cerebral vessels are patent without large vessel occlusion or correctable proximal stenosis. More distal MCA branches show ordinary atherosclerotic irregularity. Posterior circulation: Both vertebral arteries are patent through the foramen magnum to the basilar. No basilar stenosis. Posterior circulation branch vessels are patent. Primary fetal origin of the right PCA. More distal PCA branches show ordinary atherosclerotic irregularity. Venous sinuses: Patent and normal. Anatomic variants: None significant. Review of the MIP images confirms the above findings IMPRESSION: Atherosclerotic disease at both carotid bifurcations and internal carotid artery bulbs. Severe stenosis on the right measured at 80%, slightly worsened since February of 2020. 60% stenosis in the left ICA bulb, similar or minimally worsened since the prior exam. Mild changes of fibromuscular disease of both cervical internal carotid arteries without pseudoaneurysm or significant stenosis. No intracranial large vessel occlusion or correctable proximal stenosis. Distal vessel atherosclerotic irregularity diffusely. 30-50% stenosis of the right vertebral artery origin. 30% stenosis of the left vertebral artery origin. Electronically Signed   By: Nelson Chimes M.D.   On: 09/17/2021 17:24   DG Chest 2 View  Result Date: 09/17/2021 CLINICAL DATA:  Chest pain.  Hypertension. EXAM: CHEST - 2 VIEW  COMPARISON:  11/17/2019 FINDINGS: Heart size is normal. Mild atherosclerosis and tortuosity of the aorta. Chronic linear scarring in the right middle lobe and lingula. No evidence of new consolidation, collapse or effusion. IMPRESSION: Chronic linear scarring in the right middle lobe and lingula. Aortic atherosclerosis and tortuosity. Electronically Signed   By: Nelson Chimes M.D.   On: 09/17/2021 15:39   CT CHEST WO CONTRAST  Result Date: 09/17/2021 CLINICAL DATA:  Pneumonia. EXAM: CT CHEST WITHOUT CONTRAST TECHNIQUE: Multidetector CT imaging of the chest was performed following the standard protocol without IV contrast. RADIATION DOSE REDUCTION: This exam was performed according to the departmental dose-optimization program which includes automated exposure control, adjustment of the mA and/or kV according to patient size and/or use of iterative reconstruction technique. COMPARISON:  Chest radiograph dated 09/17/2021 and  CT dated 11/16/2019. FINDINGS: Evaluation of this exam is limited in the absence of intravenous contrast. Cardiovascular: Borderline cardiomegaly. No pericardial effusion. There is coronary vascular calcification. Moderate atherosclerotic calcification of the thoracic aorta. There is dilatation of the main pulmonary trunk suggestive of pulmonary hypertension. Mediastinum/Nodes: No hilar or mediastinal adenopathy. The esophagus is grossly unremarkable. No mediastinal fluid collection. Lungs/Pleura: Linear atelectasis/scarring of the right middle lobe and lingula. There is a 7 mm nodule in the right middle lobe with lobulated margins (87/3) similar to prior CT and stable dating back to 2014. Additional nodule in the right middle lobe measures 6 mm (81/3) and similar to prior CT of 11/16/2019. No focal consolidation, pleural effusion, pneumothorax. The central airways are patent. Upper Abdomen: No acute abnormality. Musculoskeletal: Osteopenia with degenerative changes of the spine. No acute  osseous pathology. IMPRESSION: 1. No acute intrathoracic pathology. 2. Stable right middle lobe pulmonary nodules. 3. Dilated main pulmonary trunk suggestive of pulmonary hypertension. 4. Aortic Atherosclerosis (ICD10-I70.0). Electronically Signed   By: Anner Crete M.D.   On: 09/17/2021 20:55      Assessment/Plan 1. Carotid stenosis.  80% right. 60% left. I have independently reviewed her CTA and would agree with that assessment.  She also has some significant tortuosity and what appears to be a difficult aortic arch on the CT which would make CEA preferred over carotid stenting.  Would try to medically optimize her and if possible cardiac clearance could begin while she is in the hospital that may expedite her surgery.  I will see her in the office in 1-2 weeks to plan and schedule right CEA. 2. Hypertensive crisis.  Would like BP optimized before CEA. Consider renal duplex and we can do in the office if desired. 3. Diabetes. Stable on outpatient medications and blood glucose control important in reducing the progression of atherosclerotic disease. Also, involved in wound healing. On appropriate medications.    Leotis Pain, MD  09/18/2021 4:13 PM    This note was created with Dragon medical transcription system.  Any error is purely unintentional

## 2021-09-18 NOTE — Progress Notes (Signed)
*  PRELIMINARY RESULTS* Echocardiogram 2D Echocardiogram has been performed.  Mary [name] 09/18/2021, 11:42 AM

## 2021-09-19 DIAGNOSIS — I160 Hypertensive urgency: Secondary | ICD-10-CM | POA: Diagnosis not present

## 2021-09-19 LAB — BASIC METABOLIC PANEL
Anion gap: 9 (ref 5–15)
BUN: 15 mg/dL (ref 8–23)
CO2: 32 mmol/L (ref 22–32)
Calcium: 9.5 mg/dL (ref 8.9–10.3)
Chloride: 96 mmol/L — ABNORMAL LOW (ref 98–111)
Creatinine, Ser: 0.91 mg/dL (ref 0.44–1.00)
GFR, Estimated: >60 mL/min (ref >60)
Glucose, Bld: 148 mg/dL — ABNORMAL HIGH (ref 70–99)
Potassium: 4.3 mmol/L (ref 3.5–5.1)
Sodium: 137 mmol/L (ref 135–145)

## 2021-09-19 LAB — MAGNESIUM: Magnesium: 1.9 mg/dL (ref 1.7–2.4)

## 2021-09-19 MED ORDER — ASPIRIN 81 MG PO TBEC: 81.0000 mg | DELAYED_RELEASE_TABLET | Freq: Every day | ORAL | 11 refills | Status: DC

## 2021-09-19 MED ORDER — FUROSEMIDE 20 MG PO TABS: 20.0000 mg | ORAL_TABLET | Freq: Every day | ORAL | 0 refills | Status: DC

## 2021-09-19 MED ORDER — TRAZODONE HCL 50 MG PO TABS: 50.0000 mg | ORAL_TABLET | Freq: Every evening | ORAL | 0 refills | Status: DC | PRN

## 2021-09-19 MED ORDER — AMLODIPINE BESYLATE 10 MG PO TABS: 10.0000 mg | ORAL_TABLET | Freq: Every day | ORAL | 0 refills | Status: DC

## 2021-09-19 NOTE — Progress Notes (Signed)
SATURATION QUALIFICATIONS: (This note is used to comply with regulatory documentation for home oxygen)  Patient Saturations on Room Air at Rest = 88%  Patient Saturations on Room Air while Ambulating = 84%  Patient Saturations on 2 Liters of oxygen while Ambulating = 94%  Please briefly explain why patient needs home oxygen: Patient requiring 2L [place] to maintain O2 sats >90% while ambulating

## 2021-09-19 NOTE — Discharge Summary (Signed)
Physician Discharge Summary  [name] MRN: [id number] DOB/AGE: 85/18/1936 85 y.o.  Admit date: 09/17/2021 Discharge date: 09/19/2021  Admission Diagnoses: Hypertensive urgency   Discharge Diagnoses:  Principal Problem:   Hypertensive urgency Active Problems:   Diabetes mellitus without complication (HCC)   GERD (gastroesophageal reflux disease)   Depression   Hypothyroidism   Carotid stenosis   Acute respiratory failure with hypoxia [facility])   Discharged Condition: stable  Hospital Course:   Mary [name] is a 87 y.o. female with medical history significant for right carotid artery stenosis (60-79 percent, evaluated by vascular surgeon on 01/18/2019), CAD, depression, type 2 diabetes mellitus, hypertension, was referred from PCPs office to the emergency room because of severe hypertension with systolic BP greater than 660.  She complained of shortness of breath.  BNP was mildly elevated at 181.  She was admitted to the hospital for hypertensive urgency.  She was found to have acute hypoxemic respiratory failure and acute on chronic diastolic CHF was suspected.  She was given IV Lasix and placed on oxygen via nasal cannula. Home O2 was ordered prior to discharge. CT chest showed likely pulmonary HTN but no concerning lung abnormality to explain dyspnea, Echo no significant systolic CHF, (+)Grade I diastolic df and mild LVH   She was also found to have severe right ICA stenosis (80% on CTA neck).  Dr. Lucky Cowboy, vascular surgeon, was consulted and he recommended outpatient follow-up for carotid endarterectomy after cardiac clearance.    Consults: vascular surgery  Significant Diagnostic Studies: see below for reports  CBC WNL, Troponin WNL, BNP 181, UA/UCx no infection, COVID/FLu neg, BMP WNL except slight elevated Glc and slight decreased Cl CT Chest concerning for pulmonary HTN, aortic atherosclerosis CTA head/neck concerning for Atherosclerotic disease at both carotid  bifurcations and internal carotid artery bulbs. Severe stenosis on the right measured at 80%, slightly worsened since February of 2020. 60% stenosis in the left ICA bulb, similar or minimally worsened since the prior exam.30-50% stenosis of the right vertebral artery origin. 30% stenosis of the left vertebral artery origin. Echocardiogram largely WNL, EF 60-65    Discharge Exam: Blood pressure 121/61, pulse 79, temperature 98.7 F (37.1 C), resp. rate 19, height 5\' 6"  (1.676 m), weight 78 kg, SpO2 94 %. General appearance: alert, cooperative, and no distress Resp: clear to auscultation bilaterally Cardio: regular rate and rhythm, S1, S2 normal, no murmur, click, rub or gallop GI: soft, non-tender; bowel sounds normal; no masses,  no organomegaly Extremities: extremities normal, atraumatic, no cyanosis or edema Skin: Skin color, texture, turgor normal. No rashes or lesions Neurologic: Grossly normal  Disposition: Discharge disposition: 01-Home or Self Care       Discharge Instructions     Diet - low sodium heart healthy   Complete by: As directed    Discharge instructions   Complete by: As directed    PATIENT NEEDS TO SCHEDULE W/ PCP, W/ CARDIOLOGY FOR OPERATIVE CLEARANCE PER DR DEW   Increase activity slowly   Complete by: As directed       Allergies as of 09/19/2021       Reactions   Cephalexin Hives   Nitrofurantoin    Other reaction(s): Other (See Comments) Other Reaction: measles-like lesions   Sulfa Antibiotics Hives   Atorvastatin Other (See Comments)   Muscle spasms/ muscle pain        Medication List     STOP taking these medications    clindamycin 300 MG capsule Commonly known  as: CLEOCIN       TAKE these medications    acetaminophen 500 MG tablet Commonly known as: TYLENOL Take 500 mg by mouth every 6 (six) hours as needed.   amLODipine 10 MG tablet Commonly known as: NORVASC Take 1 tablet (10 mg total) by mouth daily. Start taking on:  September 20, 2021 What changed: when to take this   aspirin 81 MG EC tablet Take 1 tablet (81 mg total) by mouth daily. Swallow whole. Start taking on: September 20, 2021 What changed: additional instructions   azelastine 0.05 % ophthalmic solution Commonly known as: OPTIVAR Place 1 drop into both eyes daily as needed (allergies).   busPIRone 10 MG tablet Commonly known as: BUSPAR Take 20 mg by mouth 2 (two) times daily.   carvedilol 3.125 MG tablet Commonly known as: COREG Take 3.125 mg by mouth every other day.   ezetimibe 10 MG tablet Commonly known as: ZETIA Take 10 mg by mouth daily.   furosemide 20 MG tablet Commonly known as: LASIX Take 1 tablet (20 mg total) by mouth daily. Start taking on: September 20, 2021   hydrALAZINE 100 MG tablet Commonly known as: APRESOLINE Take 1 tablet (100 mg total) by mouth 3 (three) times daily.   hydrochlorothiazide 25 MG tablet Commonly known as: HYDRODIURIL Take 1 tablet (25 mg total) by mouth daily.   levothyroxine 75 MCG tablet Commonly known as: SYNTHROID Take 75 mcg by mouth daily.   losartan 100 MG tablet Commonly known as: COZAAR Take 100 mg by mouth daily.   metFORMIN 500 MG 24 hr tablet Commonly known as: GLUCOPHAGE-XR Take 500 mg by mouth daily.   omeprazole 40 MG capsule Commonly known as: PRILOSEC Take 40 mg by mouth daily.   PARoxetine 40 MG tablet Commonly known as: PAXIL Take 40 mg by mouth every evening.   traZODone 50 MG tablet Commonly known as: DESYREL Take 1 tablet (50 mg total) by mouth at bedtime as needed for sleep. What changed:  how much to take when to take this reasons to take this   vitamin B-12 500 MCG tablet Commonly known as: CYANOCOBALAMIN Take 500 mcg by mouth daily.   vitamin E 180 MG (400 UNITS) capsule Take 400 Units by mouth 4 (four) times a week.   Vitron-C 65-125 MG Tabs Generic drug: Iron-Vitamin C Take 1 tablet by mouth daily.               Durable Medical  Equipment  (From admission, onward)           Start     Ordered   09/19/21 1402  DME Oxygen  Once       Question Answer Comment  Length of Need 12 Months   Mode or (Route) Nasal cannula   Liters per Minute 2   Frequency Continuous (stationary and portable oxygen unit needed)   Oxygen delivery system Gas      09/19/21 1401   09/19/21 1201  For home use only DME oxygen  Once       Question Answer Comment  Length of Need 12 Months   Mode or (Route) Nasal cannula   Liters per Minute 2   Frequency Continuous (stationary and portable oxygen unit needed)   Oxygen delivery system Gas      09/19/21 1200   09/19/21 1101  For home use only DME oxygen  Once       Question Answer Comment  Length of Need 12 Months   Mode  or (Route) Nasal cannula   Liters per Minute 2   Oxygen delivery system Gas      09/19/21 1100           Results for orders placed or performed during the hospital encounter of 09/17/21 (from the past 72 hour(s))  Basic metabolic panel     Status: Abnormal   Collection Time: 09/17/21  3:17 PM  Result Value Ref Range   Sodium 137 135 - 145 mmol/L   Potassium 3.7 3.5 - 5.1 mmol/L   Chloride 100 98 - 111 mmol/L   CO2 30 22 - 32 mmol/L   Glucose, Bld 136 (H) 70 - 99 mg/dL    Comment: Glucose reference range applies only to samples taken after fasting for at least 8 hours.   BUN 12 8 - 23 mg/dL   Creatinine, Ser 0.61 0.44 - 1.00 mg/dL   Calcium 9.8 8.9 - 10.3 mg/dL   GFR, Estimated >60 >60 mL/min    Comment: (NOTE) Calculated using the CKD-EPI Creatinine Equation (2021)    Anion gap 7 5 - 15    Comment: Performed at [facility], [address] [place], [place] [address]  CBC     Status: None   Collection Time: 09/17/21  3:17 PM  Result Value Ref Range   WBC 5.5 4.0 - 10.5 K/uL   RBC 4.28 3.87 - 5.11 MIL/uL   Hemoglobin 12.7 12.0 - 15.0 g/dL   HCT 41.9 36.0 - 46.0 %   MCV 97.9 80.0 - 100.0 fL   MCH 29.7 26.0 - 34.0 pg   MCHC 30.3 30.0 -  36.0 g/dL   RDW 14.5 11.5 - 15.5 %   Platelets 163 150 - 400 K/uL   nRBC 0.0 0.0 - 0.2 %    Comment: Performed at [facility], [place], [place] [address]  Troponin I (High Sensitivity)     Status: None   Collection Time: 09/17/21  3:17 PM  Result Value Ref Range   Troponin I (High Sensitivity) 14 <18 ng/L    Comment: (NOTE) Elevated high sensitivity troponin I (hsTnI) values and significant  changes across serial measurements may suggest ACS but many other  chronic and acute conditions are known to elevate hsTnI results.  Refer to the "Links" section for chest pain algorithms and additional  guidance. Performed at [facility], [address] [place], [place] [address]   Brain natriuretic peptide     Status: Abnormal   Collection Time: 09/17/21  3:17 PM  Result Value Ref Range   B Natriuretic Peptide 181.0 (H) 0.0 - 100.0 pg/mL    Comment: Performed at [facility], [address] [place], [place] [address]  Urinalysis, Complete w Microscopic     Status: Abnormal   Collection Time: 09/17/21  4:23 PM  Result Value Ref Range   Color, Urine YELLOW YELLOW   APPearance CLEAR (A) CLEAR   Specific Gravity, Urine 1.020 1.005 - 1.030   pH 7.0 5.0 - 8.0   Glucose, UA NEGATIVE NEGATIVE mg/dL   Hgb urine dipstick NEGATIVE NEGATIVE   Bilirubin Urine NEGATIVE NEGATIVE   Ketones, ur NEGATIVE NEGATIVE mg/dL   Protein, ur NEGATIVE NEGATIVE mg/dL   Nitrite NEGATIVE NEGATIVE   Leukocytes,Ua SMALL (A) NEGATIVE   RBC / HPF 0-5 0 - 5 RBC/hpf   WBC, UA 6-10 0 - 5 WBC/hpf   Bacteria, UA NONE SEEN NONE SEEN   Squamous Epithelial / LPF 0-5 0 -  5   Mucus PRESENT     Comment: Performed at [facility], [address], [place], [place] [address]  Urine Culture     Status: Abnormal   Collection Time: 09/17/21  4:23 PM   Specimen: Urine, Clean Catch  Result Value Ref Range   Specimen Description      URINE, CLEAN CATCH Performed at [facility], [address] [place], [place] [address]    Special Requests      NONE Performed at [facility], [place], [place] [address]    Culture (A)     <10,000 COLONIES/mL INSIGNIFICANT GROWTH Performed at [facility] [address], [place], [place] [address]    Report Status 09/18/2021 FINAL   Resp Panel by RT-PCR (Flu A&B, Covid) Nasopharyngeal Swab     Status: None   Collection Time: 09/17/21  5:38 PM   Specimen: Nasopharyngeal Swab; Nasopharyngeal(NP) swabs in vial transport medium  Result Value Ref Range   SARS Coronavirus 2 by RT PCR NEGATIVE NEGATIVE    Comment: (NOTE) SARS-CoV-2 target nucleic acids are NOT DETECTED.  The SARS-CoV-2 RNA is generally detectable in upper respiratory specimens during the acute phase of infection. The lowest concentration of SARS-CoV-2 viral copies this assay can detect is 138 copies/mL. A negative result does not preclude SARS-Cov-2 infection and should not be used as the sole basis for treatment or other patient management decisions. A negative result may occur with  improper specimen collection/handling, submission of specimen other than nasopharyngeal swab, presence of viral mutation(s) within the areas targeted by this assay, and inadequate number of viral copies(<138 copies/mL). A negative result must be combined with clinical observations, patient history, and epidemiological information. The expected result is Negative.  Fact Sheet for Patients:  EntrepreneurPulse.com.au  Fact Sheet for Healthcare Providers:  IncredibleEmployment.be  This test is no t yet approved or cleared by the [place] FDA and  has been authorized for detection and/or diagnosis of SARS-CoV-2 by FDA under an Emergency Use Authorization (EUA). This EUA will remain  in effect (meaning this test can be used) for the duration of the COVID-19 declaration under Section 564(b)(1) of  the Act, 21 U.S.C.section 360bbb-3(b)(1), unless the authorization is terminated  or revoked sooner.       Influenza A by PCR NEGATIVE NEGATIVE   Influenza B by PCR NEGATIVE NEGATIVE    Comment: (NOTE) The Xpert Xpress SARS-CoV-2/FLU/RSV plus assay is intended as an aid in the diagnosis of influenza from Nasopharyngeal swab specimens and should not be used as a sole basis for treatment. Nasal washings and aspirates are unacceptable for Xpert Xpress SARS-CoV-2/FLU/RSV testing.  Fact Sheet for Patients: EntrepreneurPulse.com.au  Fact Sheet for Healthcare Providers: IncredibleEmployment.be  This test is not yet approved or cleared by the [place] FDA and has been authorized for detection and/or diagnosis of SARS-CoV-2 by FDA under an Emergency Use Authorization (EUA). This EUA will remain in effect (meaning this test can be used) for the duration of the COVID-19 declaration under Section 564(b)(1) of the Act, 21 U.S.C. section 360bbb-3(b)(1), unless the authorization is terminated or revoked.  Performed at [facility], [address] [place], [place] [address]   CBG monitoring, ED     Status: Abnormal   Collection Time: 09/18/21  1:03 AM  Result Value Ref Range   Glucose-Capillary 140 (H) 70 - 99 mg/dL    Comment: Glucose reference range applies only to samples taken after fasting for  at least 8 hours.  CBC     Status: Abnormal   Collection Time: 09/18/21  7:10 AM  Result Value Ref Range   WBC 5.4 4.0 - 10.5 K/uL   RBC 4.26 3.87 - 5.11 MIL/uL   Hemoglobin 12.6 12.0 - 15.0 g/dL   HCT 41.5 36.0 - 46.0 %   MCV 97.4 80.0 - 100.0 fL   MCH 29.6 26.0 - 34.0 pg   MCHC 30.4 30.0 - 36.0 g/dL   RDW 14.6 11.5 - 15.5 %   Platelets 144 (L) 150 - 400 K/uL   nRBC 0.0 0.0 - 0.2 %    Comment: Performed at [facility], [address] [place], [place] [address]  Basic metabolic panel     Status: Abnormal   Collection Time:  09/18/21  7:10 AM  Result Value Ref Range   Sodium 139 135 - 145 mmol/L   Potassium 3.4 (L) 3.5 - 5.1 mmol/L   Chloride 97 (L) 98 - 111 mmol/L   CO2 33 (H) 22 - 32 mmol/L   Glucose, Bld 128 (H) 70 - 99 mg/dL    Comment: Glucose reference range applies only to samples taken after fasting for at least 8 hours.   BUN 13 8 - 23 mg/dL   Creatinine, Ser 0.60 0.44 - 1.00 mg/dL   Calcium 9.4 8.9 - 10.3 mg/dL   GFR, Estimated >60 >60 mL/min    Comment: (NOTE) Calculated using the CKD-EPI Creatinine Equation (2021)    Anion gap 9 5 - 15    Comment: Performed at [facility], [address] [place], [place] [address]  Magnesium     Status: None   Collection Time: 09/18/21  7:10 AM  Result Value Ref Range   Magnesium 1.9 1.7 - 2.4 mg/dL    Comment: Performed at [facility], [address] [place], [place] [address]  CBG monitoring, ED     Status: Abnormal   Collection Time: 09/18/21  8:16 AM  Result Value Ref Range   Glucose-Capillary 133 (H) 70 - 99 mg/dL    Comment: Glucose reference range applies only to samples taken after fasting for at least 8 hours.   Comment 1 Notify RN    Comment 2 Document in Chart   Glucose, capillary     Status: Abnormal   Collection Time: 09/18/21  5:43 PM  Result Value Ref Range   Glucose-Capillary 139 (H) 70 - 99 mg/dL    Comment: Glucose reference range applies only to samples taken after fasting for at least 8 hours.  Basic metabolic panel     Status: Abnormal   Collection Time: 09/19/21  5:05 AM  Result Value Ref Range   Sodium 137 135 - 145 mmol/L   Potassium 4.3 3.5 - 5.1 mmol/L   Chloride 96 (L) 98 - 111 mmol/L   CO2 32 22 - 32 mmol/L   Glucose, Bld 148 (H) 70 - 99 mg/dL    Comment: Glucose reference range applies only to samples taken after fasting for at least 8 hours.   BUN 15 8 - 23 mg/dL   Creatinine, Ser 0.91 0.44 - 1.00 mg/dL   Calcium 9.5 8.9 - 10.3 mg/dL   GFR, Estimated >60 >60 mL/min    Comment:  (NOTE) Calculated using the CKD-EPI Creatinine Equation (2021)    Anion gap 9 5 - 15    Comment: Performed at [facility], [address], [place], [place] [address]  Magnesium  Status: None   Collection Time: 09/19/21  5:05 AM  Result Value Ref Range   Magnesium 1.9 1.7 - 2.4 mg/dL    Comment: Performed at [facility], [address] [place], [place] [address]   CT [facility] HEAD NECK W WO CM  Result Date: 09/17/2021 CLINICAL DATA:  Neuro deficit, acute, stroke suspected. Hypertension and dizziness. EXAM: CT ANGIOGRAPHY HEAD AND NECK TECHNIQUE: Multidetector CT imaging of the head and neck was performed using the standard protocol during bolus administration of intravenous contrast. Multiplanar CT image reconstructions and MIPs were obtained to evaluate the vascular anatomy. Carotid stenosis measurements (when applicable) are obtained utilizing NASCET criteria, using the distal internal carotid diameter as the denominator. RADIATION DOSE REDUCTION: This exam was performed according to the departmental dose-optimization program which includes automated exposure control, adjustment of the mA and/or kV according to patient size and/or use of iterative reconstruction technique. CONTRAST:  67mL OMNIPAQUE IOHEXOL 350 MG/ML SOLN COMPARISON:  Head CT 02/19/2020.  CT angiography 09/29/2018. FINDINGS: CT HEAD FINDINGS Brain: Generalized atrophy. Chronic small-vessel ischemic changes of the cerebral hemispheric white matter. No sign of acute infarction, mass lesion, hemorrhage, hydrocephalus or extra-axial collection. Vascular: There is atherosclerotic calcification of the major vessels at the base of the brain. Skull: Negative Sinuses: Paranasal sinuses are clear. Bilateral mastoid effusions, more extensive on the left than the right. No middle ear fluid identified. Orbits: Orbits are normal. Review of the MIP images confirms the above findings CTA NECK FINDINGS Aortic arch: Aortic  atherosclerosis. Innominate artery origin from the arch not included. Left common carotid artery and left subclavian artery origins widely patent. Right carotid system: Common carotid artery is tortuous but widely patent to the bifurcation. There is calcified plaque at the carotid bifurcation and ICA bulb. Minimal diameter in the ICA bulb is 1.2 mm. Compared to a more distal cervical ICA diameter of 5.2 mm, this indicates an 80% stenosis, minimally worsened since the prior exam. Again noted is mild fibromuscular disease of the ICA. Left carotid system: Common carotid artery is widely patent to the bifurcation. Soft and calcified plaque at the carotid bifurcation and ICA bulb. 50% ECA origin stenosis. Minimal diameter of the proximal ICA measures 2 mm. Compared to a more distal cervical ICA diameter of 5 mm, this indicates a 60% stenosis. Beyond that, the cervical internal carotid artery shows some beaded irregularity consistent with fibromuscular disease. Vertebral arteries: There is calcified plaque at the non dominant right vertebral artery origin with 30-50% stenosis. Beyond that the vessel is widely patent through the cervical region to the foramen magnum. The dominant left vertebral artery shows calcified plaque at its origin with 30% stenosis. Beyond that the vessel is widely patent through the cervical region to the foramen magnum. Skeleton: Ordinary cervical spondylosis. Other neck: No mass or lymphadenopathy. Upper chest: Mild emphysema and pulmonary scarring. No active process. Review of the MIP images confirms the above findings CTA HEAD FINDINGS Anterior circulation: Both internal carotid arteries are patent through the skull base and siphon regions. No siphon stenosis greater than 30%. The anterior and middle cerebral vessels are patent without large vessel occlusion or correctable proximal stenosis. More distal MCA branches show ordinary atherosclerotic irregularity. Posterior circulation: Both  vertebral arteries are patent through the foramen magnum to the basilar. No basilar stenosis. Posterior circulation branch vessels are patent. Primary fetal origin of the right PCA. More distal PCA branches show ordinary atherosclerotic irregularity. Venous sinuses: Patent and normal. Anatomic variants: None significant. Review of  the MIP images confirms the above findings IMPRESSION: Atherosclerotic disease at both carotid bifurcations and internal carotid artery bulbs. Severe stenosis on the right measured at 80%, slightly worsened since February of 2020. 60% stenosis in the left ICA bulb, similar or minimally worsened since the prior exam. Mild changes of fibromuscular disease of both cervical internal carotid arteries without pseudoaneurysm or significant stenosis. No intracranial large vessel occlusion or correctable proximal stenosis. Distal vessel atherosclerotic irregularity diffusely. 30-50% stenosis of the right vertebral artery origin. 30% stenosis of the left vertebral artery origin. Electronically Signed   By: Nelson Chimes M.D.   On: 09/17/2021 17:24   DG Chest 2 View  Result Date: 09/17/2021 CLINICAL DATA:  Chest pain.  Hypertension. EXAM: CHEST - 2 VIEW COMPARISON:  11/17/2019 FINDINGS: Heart size is normal. Mild atherosclerosis and tortuosity of the aorta. Chronic linear scarring in the right middle lobe and lingula. No evidence of new consolidation, collapse or effusion. IMPRESSION: Chronic linear scarring in the right middle lobe and lingula. Aortic atherosclerosis and tortuosity. Electronically Signed   By: Nelson Chimes M.D.   On: 09/17/2021 15:39   CT CHEST WO CONTRAST  Result Date: 09/17/2021 CLINICAL DATA:  Pneumonia. EXAM: CT CHEST WITHOUT CONTRAST TECHNIQUE: Multidetector CT imaging of the chest was performed following the standard protocol without IV contrast. RADIATION DOSE REDUCTION: This exam was performed according to the departmental dose-optimization program which includes  automated exposure control, adjustment of the mA and/or kV according to patient size and/or use of iterative reconstruction technique. COMPARISON:  Chest radiograph dated 09/17/2021 and CT dated 11/16/2019. FINDINGS: Evaluation of this exam is limited in the absence of intravenous contrast. Cardiovascular: Borderline cardiomegaly. No pericardial effusion. There is coronary vascular calcification. Moderate atherosclerotic calcification of the thoracic aorta. There is dilatation of the main pulmonary trunk suggestive of pulmonary hypertension. Mediastinum/Nodes: No hilar or mediastinal adenopathy. The esophagus is grossly unremarkable. No mediastinal fluid collection. Lungs/Pleura: Linear atelectasis/scarring of the right middle lobe and lingula. There is a 7 mm nodule in the right middle lobe with lobulated margins (87/3) similar to prior CT and stable dating back to 2014. Additional nodule in the right middle lobe measures 6 mm (81/3) and similar to prior CT of 11/16/2019. No focal consolidation, pleural effusion, pneumothorax. The central airways are patent. Upper Abdomen: No acute abnormality. Musculoskeletal: Osteopenia with degenerative changes of the spine. No acute osseous pathology. IMPRESSION: 1. No acute intrathoracic pathology. 2. Stable right middle lobe pulmonary nodules. 3. Dilated main pulmonary trunk suggestive of pulmonary hypertension. 4. Aortic Atherosclerosis (ICD10-I70.0). Electronically Signed   By: Anner Crete M.D.   On: 09/17/2021 20:55   ECHOCARDIOGRAM COMPLETE  Result Date: 09/18/2021    ECHOCARDIOGRAM REPORT   Patient Name:   Mary [name] Date of Exam: 09/18/2021 Medical Rec #:  [id number]     Height:       66.0 in Accession #:    [id number]    Weight:       172.0 lb Date of Birth:  1935-05-19     BSA:          1.876 m Patient Age:    64 years      BP:           149/76 mmHg Patient Gender: F             HR:           49 bpm. Exam Location:  [facility] Procedure: 2D Echo, Color Doppler and  Cardiac Doppler  Indications:     CHF-acute diastolic W29.93  History:         Patient has prior history of Echocardiogram examinations, most                  recent 11/18/2019. Risk Factors:Hypertension and Diabetes.  Sonographer:     Sherrie Sport Referring Phys:  [id number] AGBATA Diagnosing Phys: Yolonda Kida MD  Sonographer Comments: Suboptimal apical window. IMPRESSIONS  1. Left ventricular ejection fraction, by estimation, is 60 to 65%. The left ventricle has normal function. The left ventricle has no regional wall motion abnormalities. There is mild left ventricular hypertrophy. Left ventricular diastolic parameters are consistent with Grade I diastolic dysfunction (impaired relaxation).  2. Right ventricular systolic function is normal. The right ventricular size is normal.  3. The mitral valve is normal in structure. Trivial mitral valve regurgitation.  4. The aortic valve is normal in structure. Aortic valve regurgitation is not visualized. FINDINGS  Left Ventricle: Left ventricular ejection fraction, by estimation, is 60 to 65%. The left ventricle has normal function. The left ventricle has no regional wall motion abnormalities. The left ventricular internal cavity size was normal in size. There is  mild left ventricular hypertrophy. Left ventricular diastolic parameters are consistent with Grade I diastolic dysfunction (impaired relaxation). Right Ventricle: The right ventricular size is normal. No increase in right ventricular wall thickness. Right ventricular systolic function is normal. Left Atrium: Left atrial size was normal in size. Right Atrium: Right atrial size was normal in size. Pericardium: There is no evidence of pericardial effusion. Mitral Valve: The mitral valve is normal in structure. Trivial mitral valve regurgitation. MV peak gradient, 4.5 mmHg. The mean mitral valve gradient is 1.0 mmHg. Tricuspid Valve: The tricuspid valve is normal in structure. Tricuspid valve regurgitation  is trivial. Aortic Valve: The aortic valve is normal in structure. Aortic valve regurgitation is not visualized. Aortic valve mean gradient measures 4.0 mmHg. Aortic valve peak gradient measures 6.8 mmHg. Aortic valve area, by VTI measures 2.08 cm. Pulmonic Valve: The pulmonic valve was normal in structure. Pulmonic valve regurgitation is not visualized. Aorta: The ascending aorta was not well visualized. IAS/Shunts: No atrial level shunt detected by color flow Doppler.  LEFT VENTRICLE PLAX 2D LVIDd:         3.60 cm   Diastology LVIDs:         2.40 cm   LV e' medial:    3.59 cm/s LV PW:         1.10 cm   LV E/e' medial:  17.9 LV IVS:        1.15 cm   LV e' lateral:   3.92 cm/s LVOT diam:     2.00 cm   LV E/e' lateral: 16.4 LV SV:         52 LV SV Index:   28 LVOT Area:     3.14 cm  RIGHT VENTRICLE RV Basal diam:  3.40 cm RV S prime:     15.30 cm/s TAPSE (M-mode): 2.3 cm LEFT ATRIUM             Index        RIGHT ATRIUM           Index LA diam:        4.40 cm 2.35 cm/m   RA Area:     23.90 cm LA Vol (A2C):   36.0 ml 19.19 ml/m  RA Volume:   80.10 ml  42.71 ml/m LA Vol (  A4C):   57.3 ml 30.55 ml/m LA Biplane Vol: 45.0 ml 23.99 ml/m  AORTIC VALVE                    PULMONIC VALVE AV Area (Vmax):    2.04 cm     PV Vmax:        1.12 m/s AV Area (Vmean):   2.07 cm     PV Vmean:       68.000 cm/s AV Area (VTI):     2.08 cm     PV VTI:         0.170 m AV Vmax:           130.50 cm/s  PV Peak grad:   5.0 mmHg AV Vmean:          92.950 cm/s  PV Mean grad:   2.0 mmHg AV VTI:            0.251 m      RVOT Peak grad: 10 mmHg AV Peak Grad:      6.8 mmHg AV Mean Grad:      4.0 mmHg LVOT Vmax:         84.90 cm/s LVOT Vmean:        61.100 cm/s LVOT VTI:          0.166 m LVOT/AV VTI ratio: 0.66  AORTA Ao Root diam: 3.10 cm MITRAL VALVE               TRICUSPID VALVE MV Area (PHT): 1.96 cm    TR Peak grad:   16.6 mmHg MV Area VTI:   1.69 cm    TR Vmax:        204.00 cm/s MV Peak grad:  4.5 mmHg MV Mean grad:  1.0 mmHg     SHUNTS MV Vmax:       1.06 m/s    Systemic VTI:  0.17 m MV Vmean:      53.0 cm/s   Systemic Diam: 2.00 cm MV Decel Time: 388 msec    Pulmonic VTI:  0.265 m MV E velocity: 64.30 cm/s MV A velocity: 87.00 cm/s MV E/A ratio:  0.74 Dwayne D Callwood MD Electronically signed by Yolonda Kida MD Signature Date/Time: 09/18/2021/5:45:18 PM    Final          Follow-up Information     Algernon Huxley, MD Follow up in 2 week(s).   Specialties: Vascular Surgery, Radiology, Interventional Cardiology Why: to discuss right CEA Contact information: [place] [address] [phone]                 Signed: Emeterio Reeve 09/19/2021, 5:38 PM

## 2021-09-19 NOTE — TOC Transition Note (Signed)
Transition of Care [facility]) - CM/SW Discharge Note   Patient Details  Name: Mary [name] MRN: [id number] Date of Birth: November 23, 1934  [facility]) CM/SW Contact:  Boris Sharper, LCSW Phone Number: 09/19/2021, 11:42 AM   Clinical Narrative:    Pt medially stable for discharge per DO. Pt will be transported home by her sister. CSW arranged HH PT, RN and Respiratory Services through [employer] with representative [place]. CSW ordered oxygen through [employer] to be delivered to the hospital before pt discharges.    Final next level of care: [place] Barriers to Discharge: Equipment Delay   Patient Goals and CMS Choice Patient states their goals for this hospitalization and ongoing recovery are:: to get back home      Discharge Placement                  Name of family member notified: Sunday Spillers Patient and family notified of of transfer: 09/19/21  Discharge Plan and Services                DME Arranged: Oxygen DME Agency: [employer] Date DME Agency Contacted: 09/19/21 Time DME Agency Contacted: [id number] Representative spoke with at DME Agency: Mardene Celeste HH Arranged: PT, RN, Respirator Therapy HH Agency: [place] Date [place]: 09/19/21 Time Mobile: [id number] Representative spoke with at [place]: [place] [employer]  Social Determinants of Health ([place]) Interventions     Readmission Risk Interventions No flowsheet data found.

## 2021-09-19 NOTE — Plan of Care (Signed)

## 2021-09-25 DIAGNOSIS — I5033 Acute on chronic diastolic (congestive) heart failure: Secondary | ICD-10-CM | POA: Insufficient documentation

## 2021-09-25 DIAGNOSIS — I5032 Chronic diastolic (congestive) heart failure: Secondary | ICD-10-CM | POA: Insufficient documentation

## 2021-09-30 ENCOUNTER — Encounter: Payer: Self-pay | Admitting: Oncology

## 2021-10-06 ENCOUNTER — Ambulatory Visit (INDEPENDENT_AMBULATORY_CARE_PROVIDER_SITE_OTHER): Payer: Medicare PPO | Admitting: Nurse Practitioner

## 2021-10-06 ENCOUNTER — Other Ambulatory Visit: Payer: Self-pay

## 2021-10-06 ENCOUNTER — Encounter (INDEPENDENT_AMBULATORY_CARE_PROVIDER_SITE_OTHER): Payer: Self-pay | Admitting: Nurse Practitioner

## 2021-10-06 VITALS — BP 183/69 | HR 68 | Ht 69.0 in | Wt 173.0 lb

## 2021-10-06 DIAGNOSIS — I10 Essential (primary) hypertension: Secondary | ICD-10-CM

## 2021-10-06 DIAGNOSIS — E785 Hyperlipidemia, unspecified: Secondary | ICD-10-CM | POA: Diagnosis not present

## 2021-10-06 DIAGNOSIS — I6521 Occlusion and stenosis of right carotid artery: Secondary | ICD-10-CM | POA: Diagnosis not present

## 2021-10-06 NOTE — Progress Notes (Signed)
Subjective:    [name], female    DOB: June 30, 1935, 87 y.o.   MRN: [id number] Chief Complaint  Patient presents with   New Patient (Initial Visit)    NP consult    Mary [name] is an 87 year old female that presents today for follow-up after recent hospitalization for hypertensive crisis.She was confused and weak and had markedly elevated blood pressures.  Today she is feeling much better.  At the time there is no focal unilateral arm or leg weakness or numbness.  There were no speech or swallowing difficulties.  She had no facial droop.  At the time she underwent a CT angiogram which I have independently reviewed and she has approximately an 80% right ICA stenosis with a 60% left ICA stenosis.  The patient is also noted to have a type III aortic arch with significant tortuosity of the carotid system.  She still continues to deny any focal neurological symptoms as well as.  She denies any amaurosis fugax.  Blood pressure still remains somewhat elevated but she is working with her primary care to decrease this.     Review of Systems  Neurological:  Negative for speech difficulty.  All other systems reviewed and are negative.     Objective:   Physical Exam Vitals reviewed.  HENT:     Head: Normocephalic.  Cardiovascular:     Rate and Rhythm: Normal rate.     Pulses: Normal pulses.  Pulmonary:     Effort: Pulmonary effort is normal.  Skin:    General: Skin is warm and dry.  Neurological:     Mental Status: She is alert and oriented to person, place, and time.  Psychiatric:        Mood and Affect: Mood normal.        Behavior: Behavior normal.        Thought Content: Thought content normal.        Judgment: Judgment normal.    BP (!) 183/69    Pulse 68    Ht 5\' 9"  (1.753 m)    Wt 173 lb (78.5 kg)    BMI 25.55 kg/m   Past Medical History:  Diagnosis Date   Acid reflux    Anemia    Arthritis    Asthma    Atherosclerosis of abdominal aorta (HCC)    B12  deficiency    Clostridium difficile infection    H/O   Coronary artery disease    DDD (degenerative disc disease), lumbar    Depression    Diabetes (HCC)    Diabetic retinopathy ([place])    Dysrhythmia    Heart murmur    HLD (hyperlipidemia)    HTN (hypertension)    Hypotension    when get up too quickly   Hypothyroidism    Pulmonary nodule    RA (rheumatoid arthritis) ([place])    Sleep apnea    Urinary urgency     Social History   Socioeconomic History   Marital status: Widowed    Spouse name: Not on file   Number of children: Not on file   Years of education: Not on file   Highest education level: Not on file  Occupational History   Occupation: retired  Tobacco Use   Smoking status: Never   Smokeless tobacco: Never  Vaping Use   Vaping Use: Never used  Substance and Sexual Activity   Alcohol use: No    Alcohol/week: 0.0 standard drinks   Drug use: No  Sexual activity: Not Currently  Other Topics Concern   Not on file  Social History Narrative   Not on file   Social Determinants of Health   Financial Resource Strain: Not on file  Food Insecurity: Not on file  Transportation Needs: Not on file  Physical Activity: Not on file  Stress: Not on file  Social Connections: Not on file  Intimate Partner Violence: Not on file    Past Surgical History:  Procedure Laterality Date   ABDOMINAL HYSTERECTOMY     COLONOSCOPY N/A 07/29/2021   Procedure: COLONOSCOPY;  Surgeon: Toledo, Benay Pike, MD;  Location: [facility] ENDOSCOPY;  Service: Gastroenterology;  Laterality: N/A;   CYSTOSCOPY     ESOPHAGOGASTRODUODENOSCOPY N/A 07/29/2021   Procedure: ESOPHAGOGASTRODUODENOSCOPY (EGD);  Surgeon: Toledo, Benay Pike, MD;  Location: [facility] ENDOSCOPY;  Service: Gastroenterology;  Laterality: N/A;  DM   HIP ARTHROPLASTY Right 09/24/2015   Procedure: ARTHROPLASTY BIPOLAR HIP (HEMIARTHROPLASTY);  Surgeon: Corky Mull, MD;  Location: [facility] ORS;  Service: Orthopedics;  Laterality: Right;   HIP  ARTHROPLASTY Left 09/29/2018   Procedure: ARTHROPLASTY BIPOLAR HIP (HEMIARTHROPLASTY) LEFT;  Surgeon: Corky Mull, MD;  Location: [facility] ORS;  Service: Orthopedics;  Laterality: Left;   JOINT REPLACEMENT     right hip   JOINT REPLACEMENT     right knee   KNEE ARTHROSCOPY W/ AUTOGENOUS CARTILAGE IMPLANTATION (ACI) PROCEDURE     TOTAL KNEE ARTHROPLASTY Left 12/01/2017   Procedure: TOTAL KNEE ARTHROPLASTY;  Surgeon: Corky Mull, MD;  Location: [facility] ORS;  Service: Orthopedics;  Laterality: Left;   VARICOSE VEIN SURGERY     VEIN SURGERY      Family History  Problem Relation Age of Onset   Kidney disease Brother        also nephew   Heart disease Mother    Heart disease Father    Prostate cancer Neg Hx    Bladder Cancer Neg Hx    Breast cancer Neg Hx    Kidney cancer Neg Hx     Allergies  Allergen Reactions   Cephalexin Hives   Nitrofurantoin     Other reaction(s): Other (See Comments) Other Reaction: measles-like lesions   Sulfa Antibiotics Hives   Atorvastatin Other (See Comments)    Muscle spasms/ muscle pain    CBC Latest Ref Rng & Units 09/18/2021 09/17/2021 05/27/2021  WBC 4.0 - 10.5 K/uL 5.4 5.5 6.1  Hemoglobin 12.0 - 15.0 g/dL 12.6 12.7 12.3  Hematocrit 36.0 - 46.0 % 41.5 41.9 40.1  Platelets 150 - 400 K/uL 144(L) 163 170      CMP     Component Value Date/Time   NA 137 09/19/2021 0505   NA 140 12/30/2013 0554   K 4.3 09/19/2021 0505   K 3.6 12/30/2013 0554   CL 96 (L) 09/19/2021 0505   CL 105 12/30/2013 0554   CO2 32 09/19/2021 0505   CO2 31 12/30/2013 0554   GLUCOSE 148 (H) 09/19/2021 0505   GLUCOSE 121 (H) 12/30/2013 0554   BUN 15 09/19/2021 0505   BUN 11 12/30/2013 0554   CREATININE 0.91 09/19/2021 0505   CREATININE 0.70 12/30/2013 0554   CALCIUM 9.5 09/19/2021 0505   CALCIUM 8.6 12/30/2013 0554   PROT 7.6 05/19/2017 1440   PROT 7.4 12/29/2013 0018   ALBUMIN 3.9 05/19/2017 1440   ALBUMIN 3.5 12/29/2013 0018   AST 22 05/19/2017 1440   AST 28  12/29/2013 0018   ALT 9 (L) 05/19/2017 1440   ALT 13 12/29/2013 0018  ALKPHOS 82 05/19/2017 1440   ALKPHOS 131 (H) 12/29/2013 0018   BILITOT 0.2 (L) 05/19/2017 1440   BILITOT 0.3 12/29/2013 0018   GFRNONAA >60 09/19/2021 0505   GFRNONAA >60 12/30/2013 0554   GFRAA >60 02/19/2020 1618   GFRAA >60 12/30/2013 0554     No results found.     Assessment & Plan:   1. Stenosis of right carotid artery Recommend:  The patient remains asymptomatic with respect to the carotid stenosis.  However, the patient has now progressed and has a lesion the is >75%.  Patient's CT angiography of the carotid arteries confirms >75% right ICA stenosis.  The anatomical considerations support surgery over stenting.  This was discussed in detail with the patient.  The patient does indeed need surgery, therefore, cardiac clearance will be arranged. Once cleared the patient will be scheduled for surgery.  The risks, benefits and alternative therapies were reviewed in detail with the patient.  All questions were answered.  The patient agrees to proceed with surgery of the right carotid artery.  Continue antiplatelet therapy as prescribed. Continue management of CAD, HTN and Hyperlipidemia. Healthy heart diet, encouraged exercise at least 4 times per week.    2. Hyperlipidemia, unspecified hyperlipidemia type Continue statin as ordered and reviewed, no changes at this time   3. Primary hypertension Continue antihypertensive medications as already ordered, these medications have been reviewed and there are no changes at this time.  Given the patient's continued hypertension, if her blood pressures remain elevated we may perform a renal artery duplex to rule out any renal artery stenosis given her history of atherosclerotic disease.   Current Outpatient Medications on File Prior to Visit  Medication Sig Dispense Refill   acetaminophen (TYLENOL) 500 MG tablet Take 500 mg by mouth every 6 (six) hours as  needed.     amLODipine (NORVASC) 10 MG tablet Take 1 tablet (10 mg total) by mouth daily. 30 tablet 0   aspirin EC 81 MG EC tablet Take 1 tablet (81 mg total) by mouth daily. Swallow whole. 30 tablet 11   azelastine (OPTIVAR) 0.05 % ophthalmic solution Place 1 drop into both eyes daily as needed (allergies).     busPIRone (BUSPAR) 10 MG tablet Take 20 mg by mouth 2 (two) times daily.      carvedilol (COREG) 3.125 MG tablet Take 3.125 mg by mouth every other day.     ezetimibe (ZETIA) 10 MG tablet Take 10 mg by mouth daily.     furosemide (LASIX) 20 MG tablet Take 1 tablet (20 mg total) by mouth daily. 30 tablet 0   hydrALAZINE (APRESOLINE) 100 MG tablet Take 1 tablet (100 mg total) by mouth 3 (three) times daily. 90 tablet 0   hydrochlorothiazide (HYDRODIURIL) 25 MG tablet Take 1 tablet (25 mg total) by mouth daily. 30 tablet 0   Iron-Vitamin C (VITRON-C) 65-125 MG TABS Take 1 tablet by mouth daily. 90 tablet 1   levothyroxine (SYNTHROID, LEVOTHROID) 75 MCG tablet Take 75 mcg by mouth daily.      losartan (COZAAR) 100 MG tablet Take 100 mg by mouth daily.      metFORMIN (GLUCOPHAGE-XR) 500 MG 24 hr tablet Take 500 mg by mouth daily.      omeprazole (PRILOSEC) 40 MG capsule Take 40 mg by mouth daily.      PARoxetine (PAXIL) 40 MG tablet Take 40 mg by mouth every evening.     traZODone (DESYREL) 50 MG tablet Take 1 tablet (50 mg total)  by mouth at bedtime as needed for sleep. 30 tablet 0   vitamin B-12 (CYANOCOBALAMIN) 500 MCG tablet Take 500 mcg by mouth daily.     vitamin E 400 UNIT capsule Take 400 Units by mouth 4 (four) times a week.     No current facility-administered medications on file prior to visit.    There are no Patient Instructions on file for this visit. No follow-ups on file.   Kris Hartmann, NP

## 2021-11-10 ENCOUNTER — Other Ambulatory Visit: Payer: Self-pay | Admitting: *Deleted

## 2021-11-10 DIAGNOSIS — D509 Iron deficiency anemia, unspecified: Secondary | ICD-10-CM

## 2021-11-18 ENCOUNTER — Telehealth (INDEPENDENT_AMBULATORY_CARE_PROVIDER_SITE_OTHER): Payer: Self-pay

## 2021-11-18 NOTE — Telephone Encounter (Signed)
Spoke with the patient and she is scheduled with Dr. Lucky Cowboy for a right carotid endarterectomy on 12/02/21 at the [facility]. Pre-op phone call is on 11/25/21 between 8-1 pm. Pre-surgical instructions were discussed and will be mailed. ?

## 2021-11-23 ENCOUNTER — Inpatient Hospital Stay: Payer: Medicare PPO | Attending: Oncology

## 2021-11-24 ENCOUNTER — Other Ambulatory Visit: Payer: Medicare PPO

## 2021-11-25 ENCOUNTER — Inpatient Hospital Stay: Payer: Medicare PPO | Admitting: Oncology

## 2021-11-25 ENCOUNTER — Other Ambulatory Visit (INDEPENDENT_AMBULATORY_CARE_PROVIDER_SITE_OTHER): Payer: Self-pay | Admitting: Nurse Practitioner

## 2021-11-25 ENCOUNTER — Encounter
Admission: RE | Admit: 2021-11-25 | Discharge: 2021-11-25 | Disposition: A | Discharge status: Home / Self Care | Payer: Medicare PPO | Source: Ambulatory Visit | Attending: Vascular Surgery | Admitting: Vascular Surgery

## 2021-11-25 ENCOUNTER — Inpatient Hospital Stay: Payer: Medicare PPO

## 2021-11-25 VITALS — Ht 66.0 in | Wt 168.0 lb

## 2021-11-25 DIAGNOSIS — Z862 Personal history of diseases of the blood and blood-forming organs and certain disorders involving the immune mechanism: Secondary | ICD-10-CM | POA: Insufficient documentation

## 2021-11-25 DIAGNOSIS — I6521 Occlusion and stenosis of right carotid artery: Secondary | ICD-10-CM

## 2021-11-25 DIAGNOSIS — I160 Hypertensive urgency: Secondary | ICD-10-CM | POA: Diagnosis not present

## 2021-11-25 DIAGNOSIS — E119 Type 2 diabetes mellitus without complications: Secondary | ICD-10-CM | POA: Diagnosis not present

## 2021-11-25 DIAGNOSIS — Z01812 Encounter for preprocedural laboratory examination: Secondary | ICD-10-CM | POA: Insufficient documentation

## 2021-11-25 DIAGNOSIS — D508 Other iron deficiency anemias: Secondary | ICD-10-CM | POA: Insufficient documentation

## 2021-11-25 HISTORY — DX: Nausea with vomiting, unspecified: R11.2

## 2021-11-25 HISTORY — DX: Pneumonia, unspecified organism: J18.9

## 2021-11-25 HISTORY — DX: Spinal stenosis, site unspecified: M48.00

## 2021-11-25 HISTORY — DX: Pure hypercholesterolemia, unspecified: E78.00

## 2021-11-25 HISTORY — DX: Nausea with vomiting, unspecified: Z98.890

## 2021-11-25 LAB — TYPE AND SCREEN
ABO/RH(D): B POS
Antibody Screen: NEGATIVE

## 2021-11-25 LAB — FERRITIN: Ferritin: 10 ng/mL — ABNORMAL LOW (ref 11–307)

## 2021-11-25 LAB — CBC WITH DIFFERENTIAL/PLATELET
Abs Immature Granulocytes: 0.01 10*3/uL (ref 0.00–0.07)
Basophils Absolute: 0.1 10*3/uL (ref 0.0–0.1)
Basophils Relative: 1 %
Eosinophils Absolute: 0.1 10*3/uL (ref 0.0–0.5)
Eosinophils Relative: 2 %
HCT: 41.2 % (ref 36.0–46.0)
Hemoglobin: 12.6 g/dL (ref 12.0–15.0)
Immature Granulocytes: 0 %
Lymphocytes Relative: 32 %
Lymphs Abs: 1.8 10*3/uL (ref 0.7–4.0)
MCH: 29.4 pg (ref 26.0–34.0)
MCHC: 30.6 g/dL (ref 30.0–36.0)
MCV: 96 fL (ref 80.0–100.0)
Monocytes Absolute: 0.6 10*3/uL (ref 0.1–1.0)
Monocytes Relative: 10 %
Neutro Abs: 3 10*3/uL (ref 1.7–7.7)
Neutrophils Relative %: 55 %
Platelets: 173 10*3/uL (ref 150–400)
RBC: 4.29 MIL/uL (ref 3.87–5.11)
RDW: 13.5 % (ref 11.5–15.5)
WBC: 5.6 10*3/uL (ref 4.0–10.5)
nRBC: 0 % (ref 0.0–0.2)

## 2021-11-25 LAB — IRON AND TIBC
Iron: 69 ug/dL (ref 28–170)
Saturation Ratios: 16 % (ref 10.4–31.8)
TIBC: 426 ug/dL (ref 250–450)
UIBC: 357 ug/dL

## 2021-11-25 LAB — BASIC METABOLIC PANEL
Anion gap: 8 (ref 5–15)
BUN: 15 mg/dL (ref 8–23)
CO2: 30 mmol/L (ref 22–32)
Calcium: 9.5 mg/dL (ref 8.9–10.3)
Chloride: 103 mmol/L (ref 98–111)
Creatinine, Ser: 0.65 mg/dL (ref 0.44–1.00)
GFR, Estimated: >60 mL/min (ref >60)
Glucose, Bld: 110 mg/dL — ABNORMAL HIGH (ref 70–99)
Potassium: 3.6 mmol/L (ref 3.5–5.1)
Sodium: 141 mmol/L (ref 135–145)

## 2021-11-25 LAB — SURGICAL PCR SCREEN
MRSA, PCR: NEGATIVE
Staphylococcus aureus: NEGATIVE

## 2021-11-25 NOTE — Patient Instructions (Addendum)
Your procedure is scheduled on: Wednesday, April 12 ?Report to the Registration Desk on the 1st floor of the [facility]. ?To find out your arrival time, please call [phone] between 1PM - 3PM on: Tuesday, April 11 ? ?REMEMBER: ?Instructions that are not followed completely may result in serious medical risk, up to and including death; or upon the discretion of your surgeon and anesthesiologist your surgery may need to be rescheduled. ? ?Do not eat or drink after midnight the night before surgery.  ?No gum chewing, lozengers or hard candies. ? ?TAKE THESE MEDICATIONS THE MORNING OF SURGERY WITH A SIP OF WATER: ? ?Amlodipine ?Buspirone ?Hydralazine ?Levothyroxine ?Omeprazole ? ?Stop metformin 2 days prior to surgery. Last day to take metformin is Sunday, April 9. Resume AFTER surgery. ? ?One week prior to surgery: ?Stop Anti-inflammatories (NSAIDS) such as Advil, Aleve, Ibuprofen, Motrin, Naproxen, Naprosyn. ?Stop ANY OVER THE COUNTER supplements until after surgery. Stop Vitamin E, vitamin C ?You may however, continue to take Tylenol if needed for pain up until the day of surgery. ? ?No Alcohol for 24 hours before or after surgery. ? ?No Smoking including e-cigarettes for 24 hours prior to surgery.  ?No chewable tobacco products for at least 6 hours prior to surgery.  ?No nicotine patches on the day of surgery. ? ?Do not use any "recreational" drugs for at least a week prior to your surgery.  ?Please be advised that the combination of cocaine and anesthesia may have negative outcomes, up to and including death. ?If you test positive for cocaine, your surgery will be cancelled. ? ?On the morning of surgery brush your teeth with toothpaste and water, you may rinse your mouth with mouthwash if you wish. ?Do not swallow any toothpaste or mouthwash. ? ?Use CHG Soap as directed on instruction sheet. ? ?Do not wear jewelry, make-up, hairpins, clips or nail polish. ? ?Do not wear lotions, powders, or perfumes.  ? ?Do  not shave body from the neck down 48 hours prior to surgery just in case you cut yourself which could leave a site for infection.  ?Also, freshly shaved skin may become irritated if using the CHG soap. ? ?Contact lenses, hearing aids and dentures may not be worn into surgery. ? ?Do not bring valuables to the hospital. [facility] is not responsible for any missing/lost belongings or valuables.  ? ?Notify your doctor if there is any change in your medical condition (cold, fever, infection). ? ?Wear comfortable clothing (specific to your surgery type) to the hospital. ? ?After surgery, you can help prevent lung complications by doing breathing exercises.  ?Take deep breaths and cough every 1-2 hours. Your doctor may order a device called an Incentive Spirometer to help you take deep breaths. ? ?If you are being admitted to the hospital overnight, leave your suitcase in the car. ?After surgery it may be brought to your room. ? ?Please call the [place] [facility] at [phone] if you have any questions about these instructions. ? ?Surgery Visitation Policy: ? ?Patients undergoing a surgery or procedure may have two family members or support persons with them as long as the person is not COVID-19 positive or experiencing its symptoms.  ? ?Inpatient Visitation:   ? ?Visiting hours are 7 a.m. to 8 p.m. ?Up to four visitors are allowed at one time in a patient room, including children. The visitors may rotate out with other people during the day. One designated support person (adult) may remain overnight.  ?

## 2021-11-26 NOTE — Pre-Procedure Instructions (Signed)
Cardiac clearance on chart from Dr Nehemiah Massed ?

## 2021-11-30 ENCOUNTER — Encounter: Payer: Self-pay | Admitting: Oncology

## 2021-11-30 ENCOUNTER — Telehealth: Payer: Self-pay | Admitting: Oncology

## 2021-11-30 ENCOUNTER — Telehealth: Payer: Self-pay

## 2021-11-30 NOTE — Telephone Encounter (Signed)
Per Dr. Tasia Catchings, patient had labs done but no shoed for her appt. Please schedule her NP/Venofer. Please notify patient of appt. Thanks  ?

## 2021-11-30 NOTE — Telephone Encounter (Signed)
-----   Message from Earlie Server, MD sent at 11/29/2021 11:31 AM EDT ----- ?Blood work was done, she no showed. Please schedule her with NP + Venofer.  ?

## 2021-11-30 NOTE — Telephone Encounter (Signed)
Spoke with patient to let her know that Dr. Tasia Catchings has recommended she see an APP and receive Venofer x 1 (pt did have her lab work complete but no showed for MD appt). Patient declined to make an appointment at this time because she is having surgery on 4/12. She stated she would call back once she has recovered to get scheduled.  ? ? ?

## 2021-12-01 MED ORDER — APREPITANT 40 MG PO CAPS: 40.0000 mg | ORAL_CAPSULE | Freq: Once | ORAL | Status: AC

## 2021-12-01 MED ORDER — SODIUM CHLORIDE 0.9 % IV SOLN: INTRAVENOUS | Status: DC

## 2021-12-01 MED ORDER — CHLORHEXIDINE GLUCONATE 0.12 % MT SOLN
15.0000 mL | Freq: Once | OROMUCOSAL | Status: AC
Start: 2021-12-01 — End: 2021-12-02

## 2021-12-01 MED ORDER — CHLORHEXIDINE GLUCONATE CLOTH 2 % EX PADS
6.0000 | MEDICATED_PAD | Freq: Once | CUTANEOUS | Status: DC
Start: 2021-12-01 — End: 2021-12-02

## 2021-12-01 MED ORDER — VANCOMYCIN HCL IN DEXTROSE 1-5 GM/200ML-% IV SOLN
1000.0000 mg | INTRAVENOUS | Status: AC
  Administered 2021-12-02: 1000 mg via INTRAVENOUS

## 2021-12-01 MED ORDER — CHLORHEXIDINE GLUCONATE CLOTH 2 % EX PADS: 6.0000 | MEDICATED_PAD | Freq: Once | CUTANEOUS | Status: DC

## 2021-12-01 MED ORDER — ORAL CARE MOUTH RINSE: 15.0000 mL | Freq: Once | OROMUCOSAL | Status: AC

## 2021-12-01 NOTE — Anesthesia Preprocedure Evaluation (Addendum)
Anesthesia Evaluation  ?Patient identified by MRN, date of birth, ID band ?Patient awake ? ? ? ?Reviewed: ?Allergy & Precautions, NPO status , Patient's Chart, lab work & pertinent test results, reviewed documented beta blocker date and time  ? ?History of Anesthesia Complications ?(+) PONV and history of anesthetic complications ? ?Airway ?Mallampati: III ? ?TM Distance: <3 FB ?Neck ROM: limited ? ? ? Dental ? ?(+) Poor Dentition, Chipped ?  ?Pulmonary ?asthma , sleep apnea ,  ?  ?Pulmonary exam normal ? ? ? ? ? ? ? Cardiovascular ?Exercise Tolerance: Good ?hypertension, Pt. on medications and Pt. on home beta blockers ?(-) angina+ CAD and +CHF (chronic diastolic HF)  ?+ dysrhythmias + Valvular Problems/Murmurs  ?+ Systolic murmurs- Carotid Bruit and - Peripheral Edema ?MPS 3/23: ?Regional wall motion: ?reveals normal myocardial?thickening and wall  ?motion.  ?The overall quality of the study is good. ?  ?Artifacts noted: no  ?Left ventricular cavity: normal.  ? ? ?ECHO 3/23: ?INTERPRETATION  ?NORMAL LEFT VENTRICULAR SYSTOLIC FUNCTION ? WITH MILD LVH  ?NORMAL RIGHT VENTRICULAR SYSTOLIC FUNCTION  ?MILD VALVULAR REGURGITATION (See above)  ?NO VALVULAR STENOSIS  ?MILD MR, TR, PR  ?EF >55%  ?. ? ?EKG 1/23: ?Sinus rhythm with Premature supraventricular complexes ?Lateral infarct , age undetermined ?Abnormal ECG ?When compared with ECG of 19-Feb-2020 15:58, ?Premature supraventricular complexes are now Present ?QRS axis Shifted right ?Lateral infarct is now Present ?Confirmed by UNCONFIRMED, ?  ?Neuro/Psych ?PSYCHIATRIC DISORDERS  Neuromuscular disease   ? GI/Hepatic ?Neg liver ROS, GERD  Medicated and Controlled,  ?Endo/Other  ?diabetes, Well Controlled, Type 2Hypothyroidism A1c 6.5 ? Renal/GU ?negative Renal ROS  ?negative genitourinary ?  ?Musculoskeletal ? ?(+) Arthritis , Rheumatoid disorders,   ? Abdominal ?Normal abdominal exam  (+)   ?Peds ? Hematology ?negative hematology  ROS ?(+)   ?Anesthesia Other Findings ?Past Medical History: ?No date: Acid reflux ?No date: Anemia ?No date: Arthritis ?No date: Asthma ?No date: Atherosclerosis of abdominal aorta ([place]) ?No date: B12 deficiency ?No date: Clostridium difficile infection ?    Comment:  H/O ?No date: Coronary artery disease ?No date: DDD (degenerative disc disease), lumbar ?No date: Depression ?No date: Diabetes [facility]) ?No date: Diabetic retinopathy ([place]) ?No date: Dysrhythmia ?No date: Heart murmur ?No date: HLD (hyperlipidemia) ?No date: HTN (hypertension) ?No date: Hypotension ?    Comment:  when get up too quickly ?No date: Hypothyroidism ?No date: Pulmonary nodule ?No date: RA (rheumatoid arthritis) ([place]) ?No date: Sleep apnea ?No date: Urinary urgency ? ?Past Surgical History: ?No date: ABDOMINAL HYSTERECTOMY ?No date: CYSTOSCOPY ?09/24/2015: HIP ARTHROPLASTY; Right ?    Comment:  Procedure: ARTHROPLASTY BIPOLAR HIP (HEMIARTHROPLASTY);  ?             Surgeon: Corky Mull, MD;  Location: [facility] ORS;  Service: ?             Orthopedics;  Laterality: Right; ?09/29/2018: HIP ARTHROPLASTY; Left ?    Comment:  Procedure: ARTHROPLASTY BIPOLAR HIP (HEMIARTHROPLASTY)  ?             LEFT;  Surgeon: Corky Mull, MD;  Location: [facility] ORS;   ?             Service: Orthopedics;  Laterality: Left; ?No date: JOINT REPLACEMENT ?    Comment:  right hip ?No date: JOINT REPLACEMENT ?    Comment:  right knee ?No date: KNEE ARTHROSCOPY W/ AUTOGENOUS CARTILAGE IMPLANTATION (ACI)  ?PROCEDURE ?12/01/2017: TOTAL KNEE ARTHROPLASTY; Left ?  Comment:  Procedure: TOTAL KNEE ARTHROPLASTY;  Surgeon: Roland Rack,  ?             Marshall Cork, MD;  Location: [facility] ORS;  Service: Orthopedics;   ?             Laterality: Left; ?No date: VARICOSE VEIN SURGERY ?No date: VEIN SURGERY ? ?BMI   ? Body Mass Index: 27.50 kg/m?  ?  ? ? Reproductive/Obstetrics ?negative OB ROS ? ?  ? ? ? ? ? ? ? ? ? ? ? ? ? ?  ?  ? ? ? ? ? ? ?Anesthesia Physical ? ?Anesthesia Plan ? ?ASA: 3 ? ?Anesthesia  Plan: General  ? ?Post-op Pain Management:   ? ?Induction: Intravenous ? ?PONV Risk Score and Plan: Ondansetron, Dexamethasone and Treatment may vary due to age or medical condition ? ?Airway Management Planned: Oral ETT ? ?Additional Equipment: Arterial line ? ?Intra-op Plan:  ? ?Post-operative Plan: Extubation in OR ? ?Informed Consent: I have reviewed the patients History and Physical, chart, labs and discussed the procedure including the risks, benefits and alternatives for the proposed anesthesia with the patient or authorized representative who has indicated his/her understanding and acceptance.  ? ? ? ?Dental advisory given ? ?Plan Discussed with: Anesthesiologist, CRNA and Surgeon ? ?Anesthesia Plan Comments: (Patient consented for risks of anesthesia including but not limited to:  ?- adverse reactions to medications ?- risk of airway placement if required ?- damage to eyes, teeth, lips or other oral mucosa ?- nerve damage due to positioning  ?- sore throat or hoarseness ?- Damage to heart, brain, nerves, lungs, other parts of body or loss of life ? ?Patient voiced understanding.)  ? ? ? ? ?Anesthesia Quick Evaluation ? ?

## 2021-12-02 ENCOUNTER — Inpatient Hospital Stay: Payer: Medicare PPO | Admitting: Anesthesiology

## 2021-12-02 ENCOUNTER — Other Ambulatory Visit: Payer: Self-pay

## 2021-12-02 ENCOUNTER — Inpatient Hospital Stay
Admission: RE | Admit: 2021-12-02 | Discharge: 2021-12-03 | DRG: 039 | Disposition: A | Discharge status: Home / Self Care | Payer: Medicare PPO | Attending: Vascular Surgery | Admitting: Vascular Surgery

## 2021-12-02 ENCOUNTER — Encounter
Admission: RE | Disposition: A | Discharge status: Home / Self Care | Payer: Self-pay | Source: Home / Self Care | Attending: Vascular Surgery

## 2021-12-02 ENCOUNTER — Encounter: Payer: Self-pay | Admitting: Vascular Surgery

## 2021-12-02 DIAGNOSIS — I2510 Atherosclerotic heart disease of native coronary artery without angina pectoris: Secondary | ICD-10-CM | POA: Diagnosis present

## 2021-12-02 DIAGNOSIS — Z20822 Contact with and (suspected) exposure to covid-19: Secondary | ICD-10-CM | POA: Diagnosis present

## 2021-12-02 DIAGNOSIS — I10 Essential (primary) hypertension: Secondary | ICD-10-CM | POA: Diagnosis present

## 2021-12-02 DIAGNOSIS — M069 Rheumatoid arthritis, unspecified: Secondary | ICD-10-CM | POA: Diagnosis present

## 2021-12-02 DIAGNOSIS — Z841 Family history of disorders of kidney and ureter: Secondary | ICD-10-CM | POA: Diagnosis not present

## 2021-12-02 DIAGNOSIS — J45909 Unspecified asthma, uncomplicated: Secondary | ICD-10-CM | POA: Diagnosis present

## 2021-12-02 DIAGNOSIS — E039 Hypothyroidism, unspecified: Secondary | ICD-10-CM | POA: Diagnosis present

## 2021-12-02 DIAGNOSIS — I6521 Occlusion and stenosis of right carotid artery: Principal | ICD-10-CM | POA: Diagnosis present

## 2021-12-02 DIAGNOSIS — E7800 Pure hypercholesterolemia, unspecified: Secondary | ICD-10-CM | POA: Diagnosis present

## 2021-12-02 DIAGNOSIS — Z90710 Acquired absence of both cervix and uterus: Secondary | ICD-10-CM

## 2021-12-02 DIAGNOSIS — E11319 Type 2 diabetes mellitus with unspecified diabetic retinopathy without macular edema: Secondary | ICD-10-CM | POA: Diagnosis present

## 2021-12-02 DIAGNOSIS — Z8249 Family history of ischemic heart disease and other diseases of the circulatory system: Secondary | ICD-10-CM | POA: Diagnosis not present

## 2021-12-02 HISTORY — PX: ENDARTERECTOMY: SHX5162

## 2021-12-02 LAB — GLUCOSE, CAPILLARY
Glucose-Capillary: 95 mg/dL (ref 70–99)
Glucose-Capillary: 132 mg/dL — ABNORMAL HIGH (ref 70–99)
Glucose-Capillary: 125 mg/dL — ABNORMAL HIGH (ref 70–99)

## 2021-12-02 SURGERY — ENDARTERECTOMY, CAROTID
Anesthesia: General | Site: Neck | Laterality: Right

## 2021-12-02 MED ORDER — ASPIRIN EC 81 MG PO TBEC
81.0000 mg | DELAYED_RELEASE_TABLET | Freq: Every day | ORAL | Status: DC
Start: 2021-12-02 — End: 2021-12-03
  Administered 2021-12-03: 81 mg via ORAL
  Filled 2021-12-02: qty 1

## 2021-12-02 MED ORDER — ROCURONIUM BROMIDE 100 MG/10ML IV SOLN
INTRAVENOUS | Status: DC | PRN
  Administered 2021-12-02: 20 mg via INTRAVENOUS
  Administered 2021-12-02: 40 mg via INTRAVENOUS

## 2021-12-02 MED ORDER — ESMOLOL HCL 100 MG/10ML IV SOLN
INTRAVENOUS | Status: DC | PRN
  Administered 2021-12-02: 30 mg via INTRAVENOUS

## 2021-12-02 MED ORDER — METFORMIN HCL ER 500 MG PO TB24
500.0000 mg | ORAL_TABLET | Freq: Every day | ORAL | Status: DC
  Administered 2021-12-02 – 2021-12-03 (×2): 500 mg via ORAL
  Filled 2021-12-02 (×2): qty 1

## 2021-12-02 MED ORDER — IRBESARTAN 75 MG PO TABS
37.5000 mg | ORAL_TABLET | Freq: Every day | ORAL | Status: DC
  Administered 2021-12-03: 37.5 mg via ORAL
  Filled 2021-12-02: qty 0.5

## 2021-12-02 MED ORDER — NITROGLYCERIN IN D5W 200-5 MCG/ML-% IV SOLN
INTRAVENOUS | Status: DC | PRN
  Administered 2021-12-02: .03 mg via INTRAVENOUS
  Administered 2021-12-02: .02 mg via INTRAVENOUS
  Administered 2021-12-02 (×2): .05 mg via INTRAVENOUS

## 2021-12-02 MED ORDER — POTASSIUM CHLORIDE CRYS ER 20 MEQ PO TBCR: 20.0000 meq | EXTENDED_RELEASE_TABLET | Freq: Every day | ORAL | Status: DC | PRN

## 2021-12-02 MED ORDER — VANCOMYCIN HCL IN DEXTROSE 1-5 GM/200ML-% IV SOLN
1000.0000 mg | Freq: Two times a day (BID) | INTRAVENOUS | Status: AC
  Administered 2021-12-02 – 2021-12-03 (×2): 1000 mg via INTRAVENOUS
  Filled 2021-12-02 (×2): qty 200

## 2021-12-02 MED ORDER — CYANOCOBALAMIN 500 MCG PO TABS
500.0000 ug | ORAL_TABLET | ORAL | Status: DC
  Administered 2021-12-02: 500 ug via ORAL
  Filled 2021-12-02: qty 1

## 2021-12-02 MED ORDER — ALUM & MAG HYDROXIDE-SIMETH 200-200-20 MG/5ML PO SUSP: 15.0000 mL | ORAL | Status: DC | PRN

## 2021-12-02 MED ORDER — SODIUM CHLORIDE 0.9 % IV SOLN: INTRAVENOUS | Status: DC

## 2021-12-02 MED ORDER — HYDRALAZINE HCL 50 MG PO TABS
100.0000 mg | ORAL_TABLET | Freq: Three times a day (TID) | ORAL | Status: DC
  Administered 2021-12-02: 100 mg via ORAL
  Administered 2021-12-03: 50 mg via ORAL
  Filled 2021-12-02 (×2): qty 2

## 2021-12-02 MED ORDER — PANTOPRAZOLE SODIUM 40 MG PO TBEC
40.0000 mg | DELAYED_RELEASE_TABLET | Freq: Every day | ORAL | Status: DC
  Administered 2021-12-03: 40 mg via ORAL
  Filled 2021-12-02: qty 1

## 2021-12-02 MED ORDER — HYDROMORPHONE HCL 1 MG/ML IJ SOLN
1.0000 mg | Freq: Once | INTRAMUSCULAR | Status: AC | PRN
  Administered 2021-12-02: 1 mg via INTRAVENOUS
  Filled 2021-12-02: qty 1

## 2021-12-02 MED ORDER — ONDANSETRON HCL 4 MG/2ML IJ SOLN
INTRAMUSCULAR | Status: DC | PRN
  Administered 2021-12-02: 4 mg via INTRAVENOUS

## 2021-12-02 MED ORDER — HYDRALAZINE HCL 20 MG/ML IJ SOLN: 5.0000 mg | INTRAMUSCULAR | Status: DC | PRN

## 2021-12-02 MED ORDER — PHENYLEPHRINE HCL-NACL 20-0.9 MG/250ML-% IV SOLN
INTRAVENOUS | Status: DC | PRN
  Administered 2021-12-02: 30 ug/min via INTRAVENOUS

## 2021-12-02 MED ORDER — PROMETHAZINE HCL 25 MG/ML IJ SOLN: 6.2500 mg | INTRAMUSCULAR | Status: DC | PRN

## 2021-12-02 MED ORDER — LABETALOL HCL 5 MG/ML IV SOLN: 5.0000 mg | INTRAVENOUS | Status: DC | PRN

## 2021-12-02 MED ORDER — FIBRIN SEALANT 2 ML SINGLE DOSE KIT
PACK | CUTANEOUS | Status: DC | PRN
Start: 2021-12-02 — End: 2021-12-02
  Administered 2021-12-02: 2 mL via TOPICAL

## 2021-12-02 MED ORDER — PHENYLEPHRINE HCL-NACL 20-0.9 MG/250ML-% IV SOLN
INTRAVENOUS | Status: AC
  Filled 2021-12-02: qty 250

## 2021-12-02 MED ORDER — ACETAMINOPHEN 325 MG PO TABS
325.0000 mg | ORAL_TABLET | ORAL | Status: DC | PRN
  Administered 2021-12-02 – 2021-12-03 (×2): 650 mg via ORAL
  Filled 2021-12-02 (×2): qty 2

## 2021-12-02 MED ORDER — FENTANYL CITRATE (PF) 100 MCG/2ML IJ SOLN
INTRAMUSCULAR | Status: AC
  Administered 2021-12-02: 25 ug via INTRAVENOUS
  Filled 2021-12-02: qty 2

## 2021-12-02 MED ORDER — FENTANYL CITRATE (PF) 100 MCG/2ML IJ SOLN
INTRAMUSCULAR | Status: DC | PRN
  Administered 2021-12-02: 50 ug via INTRAVENOUS
  Administered 2021-12-02 (×2): 25 ug via INTRAVENOUS

## 2021-12-02 MED ORDER — LABETALOL HCL 5 MG/ML IV SOLN: 10.0000 mg | INTRAVENOUS | Status: DC | PRN

## 2021-12-02 MED ORDER — PHENYLEPHRINE 40 MCG/ML (10ML) SYRINGE FOR IV PUSH (FOR BLOOD PRESSURE SUPPORT)
PREFILLED_SYRINGE | INTRAVENOUS | Status: DC | PRN
  Administered 2021-12-02 (×2): 120 ug via INTRAVENOUS
  Administered 2021-12-02: 80 ug via INTRAVENOUS

## 2021-12-02 MED ORDER — GUAIFENESIN-DM 100-10 MG/5ML PO SYRP: 15.0000 mL | ORAL_SOLUTION | ORAL | Status: DC | PRN

## 2021-12-02 MED ORDER — VANCOMYCIN HCL IN DEXTROSE 1-5 GM/200ML-% IV SOLN
INTRAVENOUS | Status: AC
  Filled 2021-12-02: qty 200

## 2021-12-02 MED ORDER — ONDANSETRON HCL 4 MG/2ML IJ SOLN: 4.0000 mg | Freq: Four times a day (QID) | INTRAMUSCULAR | Status: DC | PRN

## 2021-12-02 MED ORDER — 0.9 % SODIUM CHLORIDE (POUR BTL) OPTIME
TOPICAL | Status: DC | PRN
  Administered 2021-12-02: 1000 mL

## 2021-12-02 MED ORDER — EPHEDRINE SULFATE (PRESSORS) 50 MG/ML IJ SOLN
INTRAMUSCULAR | Status: DC | PRN
Start: 2021-12-02 — End: 2021-12-02
  Administered 2021-12-02 (×4): 5 mg via INTRAVENOUS
  Administered 2021-12-02 (×2): 10 mg via INTRAVENOUS

## 2021-12-02 MED ORDER — OXYCODONE HCL 5 MG/5ML PO SOLN: 5.0000 mg | Freq: Once | ORAL | Status: DC | PRN

## 2021-12-02 MED ORDER — SODIUM CHLORIDE 0.9 % IV SOLN
INTRAVENOUS | Status: DC | PRN
Start: 2021-12-02 — End: 2021-12-02
  Administered 2021-12-02: 251.5 mL via INTRAMUSCULAR

## 2021-12-02 MED ORDER — HYDROCHLOROTHIAZIDE 25 MG PO TABS
25.0000 mg | ORAL_TABLET | Freq: Every day | ORAL | Status: DC
  Administered 2021-12-03: 25 mg via ORAL
  Filled 2021-12-02: qty 1

## 2021-12-02 MED ORDER — CHLORHEXIDINE GLUCONATE 0.12 % MT SOLN
OROMUCOSAL | Status: AC
  Administered 2021-12-02: 15 mL via OROMUCOSAL
  Filled 2021-12-02: qty 15

## 2021-12-02 MED ORDER — FENTANYL CITRATE (PF) 100 MCG/2ML IJ SOLN
INTRAMUSCULAR | Status: AC
Start: 2021-12-02 — End: ?
  Filled 2021-12-02: qty 2

## 2021-12-02 MED ORDER — ACETAMINOPHEN 500 MG PO TABS: 500.0000 mg | ORAL_TABLET | Freq: Four times a day (QID) | ORAL | Status: DC | PRN

## 2021-12-02 MED ORDER — FAMOTIDINE IN NACL 20-0.9 MG/50ML-% IV SOLN
20.0000 mg | Freq: Two times a day (BID) | INTRAVENOUS | Status: DC
  Administered 2021-12-02: 20 mg via INTRAVENOUS
  Filled 2021-12-02: qty 50

## 2021-12-02 MED ORDER — OXYCODONE HCL 5 MG PO TABS: 5.0000 mg | ORAL_TABLET | Freq: Once | ORAL | Status: DC | PRN

## 2021-12-02 MED ORDER — OXYCODONE-ACETAMINOPHEN 5-325 MG PO TABS
1.0000 | ORAL_TABLET | ORAL | Status: DC | PRN
  Administered 2021-12-03: 1 via ORAL
  Filled 2021-12-02: qty 1

## 2021-12-02 MED ORDER — PAROXETINE HCL 20 MG PO TABS
40.0000 mg | ORAL_TABLET | Freq: Every evening | ORAL | Status: DC
Start: 2021-12-02 — End: 2021-12-03
  Administered 2021-12-02: 40 mg via ORAL
  Filled 2021-12-02 (×2): qty 2

## 2021-12-02 MED ORDER — HEPARIN SODIUM (PORCINE) 1000 UNIT/ML IJ SOLN
INTRAMUSCULAR | Status: DC | PRN
  Administered 2021-12-02: 6000 [IU] via INTRAVENOUS

## 2021-12-02 MED ORDER — LIDOCAINE HCL (CARDIAC) PF 100 MG/5ML IV SOSY
PREFILLED_SYRINGE | INTRAVENOUS | Status: DC | PRN
Start: 2021-12-02 — End: 2021-12-02
  Administered 2021-12-02: 80 mg via INTRAVENOUS

## 2021-12-02 MED ORDER — NITROGLYCERIN IN D5W 200-5 MCG/ML-% IV SOLN
INTRAVENOUS | Status: AC
  Filled 2021-12-02: qty 250

## 2021-12-02 MED ORDER — SUGAMMADEX SODIUM 200 MG/2ML IV SOLN
INTRAVENOUS | Status: DC | PRN
  Administered 2021-12-02: 160 mg via INTRAVENOUS

## 2021-12-02 MED ORDER — PROPOFOL 10 MG/ML IV BOLUS
INTRAVENOUS | Status: DC | PRN
  Administered 2021-12-02: 80 mg via INTRAVENOUS
  Administered 2021-12-02: 120 mg via INTRAVENOUS

## 2021-12-02 MED ORDER — MORPHINE SULFATE (PF) 2 MG/ML IV SOLN: 2.0000 mg | INTRAVENOUS | Status: DC | PRN

## 2021-12-02 MED ORDER — FUROSEMIDE 20 MG PO TABS
20.0000 mg | ORAL_TABLET | Freq: Every day | ORAL | Status: DC
  Administered 2021-12-03: 20 mg via ORAL
  Filled 2021-12-02: qty 1

## 2021-12-02 MED ORDER — NITROGLYCERIN IN D5W 200-5 MCG/ML-% IV SOLN: 5.0000 ug/min | INTRAVENOUS | Status: DC

## 2021-12-02 MED ORDER — FENTANYL CITRATE (PF) 100 MCG/2ML IJ SOLN
25.0000 ug | INTRAMUSCULAR | Status: DC | PRN
  Administered 2021-12-02 (×2): 25 ug via INTRAVENOUS

## 2021-12-02 MED ORDER — HEPARIN SODIUM (PORCINE) 5000 UNIT/ML IJ SOLN
INTRAMUSCULAR | Status: AC
  Filled 2021-12-02: qty 2

## 2021-12-02 MED ORDER — SODIUM CHLORIDE 0.9 % IV SOLN
500.0000 mL | Freq: Once | INTRAVENOUS | Status: AC | PRN
  Administered 2021-12-03: 500 mL via INTRAVENOUS

## 2021-12-02 MED ORDER — TRAZODONE HCL 50 MG PO TABS
25.0000 mg | ORAL_TABLET | Freq: Every evening | ORAL | Status: DC | PRN
  Administered 2021-12-02: 25 mg via ORAL
  Filled 2021-12-02: qty 1

## 2021-12-02 MED ORDER — METOPROLOL TARTRATE 5 MG/5ML IV SOLN: 2.0000 mg | INTRAVENOUS | Status: DC | PRN

## 2021-12-02 MED ORDER — PHENOL 1.4 % MT LIQD: 1.0000 | OROMUCOSAL | Status: DC | PRN

## 2021-12-02 MED ORDER — LABETALOL HCL 5 MG/ML IV SOLN
INTRAVENOUS | Status: DC | PRN
  Administered 2021-12-02: 5 mg via INTRAVENOUS

## 2021-12-02 MED ORDER — DEXAMETHASONE SODIUM PHOSPHATE 10 MG/ML IJ SOLN
INTRAMUSCULAR | Status: DC | PRN
Start: 2021-12-02 — End: 2021-12-02
  Administered 2021-12-02: 5 mg via INTRAVENOUS

## 2021-12-02 MED ORDER — ROSUVASTATIN CALCIUM 10 MG PO TABS
5.0000 mg | ORAL_TABLET | Freq: Every day | ORAL | Status: DC
  Administered 2021-12-03: 5 mg via ORAL
  Filled 2021-12-02: qty 1

## 2021-12-02 MED ORDER — PHENYLEPHRINE HCL (PRESSORS) 10 MG/ML IV SOLN
INTRAVENOUS | Status: DC | PRN
  Administered 2021-12-02: 160 ug via INTRAVENOUS
  Administered 2021-12-02: 80 ug via INTRAVENOUS

## 2021-12-02 MED ORDER — MAGNESIUM SULFATE 2 GM/50ML IV SOLN
2.0000 g | Freq: Every day | INTRAVENOUS | Status: DC | PRN
  Filled 2021-12-02: qty 50

## 2021-12-02 MED ORDER — LEVOTHYROXINE SODIUM 75 MCG PO TABS
75.0000 ug | ORAL_TABLET | Freq: Every day | ORAL | Status: DC
  Administered 2021-12-03: 75 ug via ORAL
  Filled 2021-12-02: qty 1

## 2021-12-02 MED ORDER — EZETIMIBE 10 MG PO TABS
10.0000 mg | ORAL_TABLET | Freq: Every day | ORAL | Status: DC
  Administered 2021-12-02 – 2021-12-03 (×2): 10 mg via ORAL
  Filled 2021-12-02 (×2): qty 1

## 2021-12-02 MED ORDER — ACETAMINOPHEN 650 MG RE SUPP: 325.0000 mg | RECTAL | Status: DC | PRN

## 2021-12-02 MED ORDER — BUSPIRONE HCL 10 MG PO TABS
20.0000 mg | ORAL_TABLET | Freq: Two times a day (BID) | ORAL | Status: DC
  Administered 2021-12-02 – 2021-12-03 (×2): 20 mg via ORAL
  Filled 2021-12-02 (×2): qty 2

## 2021-12-02 MED ORDER — APREPITANT 40 MG PO CAPS
ORAL_CAPSULE | ORAL | Status: AC
  Administered 2021-12-02: 40 mg via ORAL
  Filled 2021-12-02: qty 1

## 2021-12-02 MED ORDER — AMLODIPINE BESYLATE 10 MG PO TABS
10.0000 mg | ORAL_TABLET | Freq: Every day | ORAL | Status: DC
  Administered 2021-12-03: 10 mg via ORAL
  Filled 2021-12-02: qty 1

## 2021-12-02 MED ORDER — ACETAMINOPHEN 10 MG/ML IV SOLN: 1000.0000 mg | Freq: Once | INTRAVENOUS | Status: DC | PRN

## 2021-12-02 MED ORDER — ESMOLOL HCL-SODIUM CHLORIDE 2000 MG/100ML IV SOLN
25.0000 ug/kg/min | INTRAVENOUS | Status: DC
  Filled 2021-12-02: qty 100

## 2021-12-02 MED ORDER — CLOPIDOGREL BISULFATE 75 MG PO TABS
75.0000 mg | ORAL_TABLET | Freq: Every day | ORAL | Status: DC
  Administered 2021-12-03: 75 mg via ORAL
  Filled 2021-12-02: qty 1

## 2021-12-02 MED ORDER — CHLORHEXIDINE GLUCONATE CLOTH 2 % EX PADS
6.0000 | MEDICATED_PAD | Freq: Every day | CUTANEOUS | Status: DC
  Administered 2021-12-02: 6 via TOPICAL

## 2021-12-02 MED ORDER — ASPIRIN EC 81 MG PO TBEC: 81.0000 mg | DELAYED_RELEASE_TABLET | Freq: Every day | ORAL | Status: DC

## 2021-12-02 MED ORDER — LIDOCAINE HCL (PF) 1 % IJ SOLN
INTRAMUSCULAR | Status: AC
  Filled 2021-12-02: qty 30

## 2021-12-02 MED ORDER — LIDOCAINE HCL 1 % IJ SOLN
INTRAMUSCULAR | Status: DC | PRN
  Administered 2021-12-02: 10 mL

## 2021-12-02 MED ORDER — KETOTIFEN FUMARATE 0.025 % OP SOLN
1.0000 [drp] | Freq: Two times a day (BID) | OPHTHALMIC | Status: DC
  Administered 2021-12-02 – 2021-12-03 (×2): 1 [drp] via OPHTHALMIC
  Filled 2021-12-02 (×2): qty 5

## 2021-12-02 MED ORDER — PROPOFOL 500 MG/50ML IV EMUL
INTRAVENOUS | Status: AC
  Filled 2021-12-02: qty 50

## 2021-12-02 MED ORDER — DOCUSATE SODIUM 100 MG PO CAPS: 100.0000 mg | ORAL_CAPSULE | Freq: Every day | ORAL | Status: DC

## 2021-12-02 SURGICAL SUPPLY — 66 items
ADH SKN CLS APL DERMABOND .7 (GAUZE/BANDAGES/DRESSINGS) ×1
APL PRP STRL LF DISP 70% ISPRP (MISCELLANEOUS) ×1
BAG DECANTER FOR FLEXI CONT (MISCELLANEOUS) ×2 IMPLANT
BLADE SURG 15 STRL LF DISP TIS (BLADE) ×1 IMPLANT
BLADE SURG 15 STRL SS (BLADE) ×2
BLADE SURG SZ11 CARB STEEL (BLADE) ×2 IMPLANT
BOOT SUTURE AID YELLOW STND (SUTURE) ×2 IMPLANT
BRUSH SCRUB EZ  4% CHG (MISCELLANEOUS) ×2
BRUSH SCRUB EZ 4% CHG (MISCELLANEOUS) ×1 IMPLANT
CHLORAPREP W/TINT 26 (MISCELLANEOUS) ×2 IMPLANT
DERMABOND ADVANCED (GAUZE/BANDAGES/DRESSINGS) ×1
DERMABOND ADVANCED .7 DNX12 (GAUZE/BANDAGES/DRESSINGS) ×1 IMPLANT
DRAPE 3/4 80X56 (DRAPES) ×2 IMPLANT
DRAPE INCISE IOBAN 66X45 STRL (DRAPES) ×2 IMPLANT
DRAPE LAPAROTOMY 77X122 PED (DRAPES) ×2 IMPLANT
ELECT CAUTERY BLADE 6.4 (BLADE) ×2 IMPLANT
ELECT REM PT RETURN 9FT ADLT (ELECTROSURGICAL) ×2
ELECTRODE REM PT RTRN 9FT ADLT (ELECTROSURGICAL) ×1 IMPLANT
GAUZE 4X4 16PLY ~~LOC~~+RFID DBL (SPONGE) ×2 IMPLANT
GLOVE SURG SYN 7.0 (GLOVE) ×4 IMPLANT
GLOVE SURG SYN 7.0 PF PI (GLOVE) ×2 IMPLANT
GOWN STRL REUS W/ TWL LRG LVL3 (GOWN DISPOSABLE) ×2 IMPLANT
GOWN STRL REUS W/ TWL XL LVL3 (GOWN DISPOSABLE) ×2 IMPLANT
GOWN STRL REUS W/TWL LRG LVL3 (GOWN DISPOSABLE) ×4
GOWN STRL REUS W/TWL XL LVL3 (GOWN DISPOSABLE) ×4
HEMOSTAT SURGICEL 2X3 (HEMOSTASIS) ×2 IMPLANT
IV NS 250ML (IV SOLUTION) ×2
IV NS 250ML BAXH (IV SOLUTION) ×1 IMPLANT
KIT TURNOVER KIT A (KITS) ×2 IMPLANT
LABEL OR SOLS (LABEL) ×2 IMPLANT
LOOP RED MAXI  1X406MM (MISCELLANEOUS) ×2
LOOP VESSEL MAXI  1X406 RED (MISCELLANEOUS) ×2
LOOP VESSEL MAXI 1X406 RED (MISCELLANEOUS) ×2 IMPLANT
LOOP VESSEL MINI 0.8X406 BLUE (MISCELLANEOUS) ×1 IMPLANT
LOOPS BLUE MINI 0.8X406MM (MISCELLANEOUS) ×1
MANIFOLD NEPTUNE II (INSTRUMENTS) ×2 IMPLANT
NDL FILTER BLUNT 18X1 1/2 (NEEDLE) ×1 IMPLANT
NDL HYPO 25X1 1.5 SAFETY (NEEDLE) ×1 IMPLANT
NEEDLE FILTER BLUNT 18X 1/2SAF (NEEDLE) ×1
NEEDLE FILTER BLUNT 18X1 1/2 (NEEDLE) ×1 IMPLANT
NEEDLE HYPO 25X1 1.5 SAFETY (NEEDLE) ×2 IMPLANT
NS IRRIG 500ML POUR BTL (IV SOLUTION) ×2 IMPLANT
PACK BASIN MAJOR ARMC (MISCELLANEOUS) ×2 IMPLANT
PATCH CAROTID ECM VASC 1X10 (Prosthesis & Implant Heart) ×2 IMPLANT
PENCIL ELECTRO HAND CTR (MISCELLANEOUS) IMPLANT
SET WALTER ACTIVATION W/DRAPE (SET/KITS/TRAYS/PACK) ×2 IMPLANT
SHUNT W TPORT 9FR PRUITT F3 (SHUNT) ×2 IMPLANT
SPONGE T-LAP 18X18 ~~LOC~~+RFID (SPONGE) ×4 IMPLANT
SUT MNCRL 4-0 (SUTURE) ×2
SUT MNCRL 4-0 27XMFL (SUTURE) ×1
SUT PROLENE 6 0 BV (SUTURE) ×8 IMPLANT
SUT PROLENE 7 0 BV 1 (SUTURE) ×4 IMPLANT
SUT SILK 2 0 (SUTURE) ×2
SUT SILK 2-0 18XBRD TIE 12 (SUTURE) ×1 IMPLANT
SUT SILK 3 0 (SUTURE) ×2
SUT SILK 3-0 18XBRD TIE 12 (SUTURE) ×1 IMPLANT
SUT SILK 4 0 (SUTURE) ×2
SUT SILK 4-0 18XBRD TIE 12 (SUTURE) ×1 IMPLANT
SUT VIC AB 3-0 SH 27 (SUTURE) ×4
SUT VIC AB 3-0 SH 27X BRD (SUTURE) ×2 IMPLANT
SUTURE MNCRL 4-0 27XMF (SUTURE) ×1 IMPLANT
SYR 10ML LL (SYRINGE) ×4 IMPLANT
SYR 20ML LL LF (SYRINGE) ×2 IMPLANT
TRAY FOLEY MTR SLVR 16FR STAT (SET/KITS/TRAYS/PACK) ×2 IMPLANT
TUBING CONNECTING 10 (TUBING) IMPLANT
WATER STERILE IRR 500ML POUR (IV SOLUTION) ×2 IMPLANT

## 2021-12-02 NOTE — Anesthesia Procedure Notes (Signed)
Procedure Name: Intubation ?Date/Time: 12/02/2021 8:04 AM ?Performed by: Aline Brochure, CRNA ?Pre-anesthesia Checklist: Patient identified, Patient being monitored, Timeout performed, Emergency Drugs available and Suction available ?Patient Re-evaluated:Patient Re-evaluated prior to induction ?Oxygen Delivery Method: Circle system utilized ?Preoxygenation: Pre-oxygenation with 100% oxygen ?Induction Type: IV induction ?Ventilation: Mask ventilation without difficulty ?Laryngoscope Size: 3 and McGraph ?Grade View: Grade I ?Tube type: Oral ?Tube size: 7.0 mm ?Number of attempts: 1 ?Airway Equipment and Method: Stylet ?Placement Confirmation: ETT inserted through vocal cords under direct vision, positive ETCO2 and breath sounds checked- equal and bilateral ?Secured at: 21 cm ?Tube secured with: Tape ?Dental Injury: Teeth and Oropharynx as per pre-operative assessment  ? ? ? ? ?

## 2021-12-02 NOTE — Plan of Care (Signed)
Admitted s/p Endarectomy to ICU with a line and foley.  Alert and oriented x4 vitals stable.  Sister at bedside. ?

## 2021-12-02 NOTE — Transfer of Care (Signed)
Immediate Anesthesia Transfer of Care Note ? ?Patient: [name] ? ?Procedure(s) Performed: ENDARTERECTOMY CAROTID (Right: Neck) ? ?Patient Location: PACU ? ?Anesthesia Type:General ? ?Level of Consciousness: awake, alert  and oriented ? ?Airway & Oxygen Therapy: Patient Spontanous Breathing and Patient connected to face mask oxygen ? ?Post-op Assessment: Report given to RN and Post -op Vital signs reviewed and stable ? ?Post vital signs: Reviewed and stable ? ?Last Vitals:  ?Vitals Value Taken Time  ?BP 135/53 12/02/21 1003  ?Temp    ?Pulse 61 12/02/21 1008  ?Resp 14 12/02/21 1008  ?SpO2 95 % 12/02/21 1008  ?Vitals shown include unvalidated device data. ? ?Last Pain:  ?Vitals:  ? 12/02/21 0653  ?TempSrc: Temporal  ?PainSc: 0-No pain  ?   ? ?  ? ?Complications: No notable events documented. ?

## 2021-12-02 NOTE — H&P (Signed)
?[facility] ?Admission History & Physical ? ?MRN : [id number] ? ?Mary [name] is a 87 y.o. (April 12, 1935) female who presents with chief complaint of No chief complaint on file. ?. ? ?History of Present Illness: patient with high grade right carotid stenosis here for right CEA.  No new complaints today. ? ?Current Facility-Administered Medications  ?Medication Dose Route Frequency Provider Last Rate Last Admin  ? 0.9 %  sodium chloride infusion   Intravenous Continuous [place] Ouch, MD 10 mL/hr at 12/02/21 0656 New Bag at 12/02/21 0656  ? Chlorhexidine Gluconate Cloth 2 % PADS 6 each  6 each Topical Once Kris Hartmann, NP      ? And  ? Chlorhexidine Gluconate Cloth 2 % PADS 6 each  6 each Topical Once Kris Hartmann, NP      ? vancomycin (VANCOCIN) 1-5 GM/200ML-% IVPB           ? vancomycin (VANCOCIN) IVPB 1000 mg/200 mL premix  1,000 mg Intravenous On Call to [place], Roe, NP      ? ? ?Past Medical History:  ?Diagnosis Date  ? Acid reflux   ? Anemia   ? Arthritis   ? Asthma   ? Atherosclerosis of abdominal aorta ([place])   ? B12 deficiency   ? Clostridium difficile infection   ? H/O  ? Coronary artery disease   ? DDD (degenerative disc disease), lumbar   ? Depression   ? Diabetes ([place])   ? type 2  ? Diabetic retinopathy ([place])   ? Dysrhythmia   ? Heart murmur   ? HLD (hyperlipidemia)   ? HTN (hypertension)   ? Hypotension   ? when get up too quickly  ? Hypothyroidism   ? Pneumonia   ? PONV (postoperative nausea and vomiting)   ? Pulmonary nodule   ? right middle lobe on CT scan  ? Pure hypercholesterolemia   ? RA (rheumatoid arthritis) ([place])   ? Sleep apnea   ? Spinal stenosis   ? Urinary urgency   ? ? ?Past Surgical History:  ?Procedure Laterality Date  ? ABDOMINAL HYSTERECTOMY    ? CATARACT EXTRACTION, BILATERAL    ? COLONOSCOPY N/A 07/29/2021  ? Procedure: COLONOSCOPY;  Surgeon: Toledo, Benay Pike, MD;  Location: [facility] END[facility]COPY;  Service: Gastroenterology;  Laterality: N/A;   ? CYSTOSCOPY    ? ESOPHAGOGASTRODUODENOSCOPY N/A 07/29/2021  ? Procedure: ESOPHAGOGASTRODUODENOSCOPY (EGD);  Surgeon: Toledo, Benay Pike, MD;  Location: [facility] END[facility];  Service: Gastroenterology;  Laterality: N/A;  DM  ? HIP ARTHROPLASTY Right 09/24/2015  ? Procedure: ARTHROPLASTY BIPOLAR HIP (HEMIARTHROPLASTY);  Surgeon: Corky Mull, MD;  Location: [facility] ORS;  Service: Orthopedics;  Laterality: Right;  ? HIP ARTHROPLASTY Left 09/29/2018  ? Procedure: ARTHROPLASTY BIPOLAR HIP (HEMIARTHROPLASTY) LEFT;  Surgeon: Corky Mull, MD;  Location: [facility] ORS;  Service: Orthopedics;  Laterality: Left;  ? KNEE ARTHROSCOPY W/ AUTOGENOUS CARTILAGE IMPLANTATION (ACI) PROCEDURE    ? TOTAL KNEE ARTHROPLASTY Left 12/01/2017  ? Procedure: TOTAL KNEE ARTHROPLASTY;  Surgeon: Corky Mull, MD;  Location: [facility] ORS;  Service: Orthopedics;  Laterality: Left;  ? TOTAL KNEE ARTHROPLASTY Right 2014  ? VARICOSE VEIN SURGERY Left   ? leg  ? ? ? ?Social History  ? ?Tobacco Use  ? Smoking status: Never  ? Smokeless tobacco: Never  ?Vaping Use  ? Vaping Use: Never used  ?Substance Use Topics  ? Alcohol use: No  ?  Alcohol/week: 0.0 standard drinks  ? Drug  use: No  ? ? ? ?Family History  ?Problem Relation Age of Onset  ? Kidney disease Brother   ?     also nephew  ? Heart disease Mother   ? Heart disease Father   ? Prostate cancer Neg Hx   ? Bladder Cancer Neg Hx   ? Breast cancer Neg Hx   ? Kidney cancer Neg Hx   ? ? ?Allergies  ?Allergen Reactions  ? Cephalexin Hives  ? Nitrofurantoin   ?  Other reaction(s): Other (See Comments) ?Other Reaction: measles-like lesions  ? Sulfa Antibiotics Hives  ? Atorvastatin Other (See Comments)  ?  Muscle spasms/ muscle pain  ? ? ? ?REVIEW OF SYSTEMS (Negative unless checked) ? ?Constitutional: '[]'$ Weight loss  '[]'$ Fever  '[]'$ Chills ?Cardiac: '[]'$ Chest pain   '[]'$ Chest pressure   '[]'$ Palpitations   '[]'$ Shortness of breath when laying flat   '[]'$ Shortness of breath at rest   '[]'$ Shortness of breath with exertion. ?Vascular:   '[]'$ Pain in legs with walking   '[]'$ Pain in legs at rest   '[]'$ Pain in legs when laying flat   '[]'$ Claudication   '[]'$ Pain in feet when walking  '[]'$ Pain in feet at rest  '[]'$ Pain in feet when laying flat   '[]'$ History of DVT   '[]'$ Phlebitis   '[]'$ Swelling in legs   '[]'$ Varicose veins   '[]'$ Non-healing ulcers ?Pulmonary:   '[]'$ Uses home oxygen   '[]'$ Productive cough   '[]'$ Hemoptysis   '[]'$ Wheeze  '[]'$ COPD   '[]'$ Asthma ?Neurologic:  '[]'$ Dizziness  '[]'$ Blackouts   '[]'$ Seizures   '[]'$ History of stroke   '[]'$ History of TIA  '[]'$ Aphasia   '[]'$ Temporary blindness   '[]'$ Dysphagia   '[]'$ Weakness or numbness in arms   '[]'$ Weakness or numbness in legs ?Musculoskeletal:  '[x]'$ Arthritis   '[]'$ Joint swelling   '[x]'$ Joint pain   '[]'$ Low back pain ?Hematologic:  '[]'$ Easy bruising  '[]'$ Easy bleeding   '[]'$ Hypercoagulable state   '[]'$ Anemic  '[]'$ Hepatitis ?Gastrointestinal:  '[]'$ Blood in stool   '[]'$ Vomiting blood  '[]'$ Gastroesophageal reflux/heartburn   '[]'$ Difficulty swallowing. ?Genitourinary:  '[]'$ Chronic kidney disease   '[]'$ Difficult urination  '[]'$ Frequent urination  '[]'$ Burning with urination   '[]'$ Blood in urine ?Skin:  '[]'$ Rashes   '[]'$ Ulcers   '[]'$ Wounds ?Psychological:  '[]'$ History of anxiety   '[]'$  History of major depression. ? ?Physical Examination ? ?Vitals:  ? 12/02/21 0653  ?BP: 117/68  ?Pulse: 77  ?Resp: 18  ?Temp: 98 ?F (36.7 ?C)  ?TempSrc: Temporal  ?SpO2: 96%  ?Weight: 76.2 kg  ?Height: '5\' 6"'$  (1.676 m)  ? ?Body mass index is 27.12 kg/m?. ?Gen: WD/WN, NAD ?Head: [place]/AT, No temporalis wasting.  ?Ear/Nose/Throat: Hearing grossly intact, nares w/o erythema or drainage, oropharynx w/o Erythema/Exudate,  ?Eyes: Conjunctiva clear, sclera non-icteric ?Neck: Trachea midline.  No JVD.  ?Pulmonary:  Good air movement, respirations not labored, no use of accessory muscles.  ?Cardiac: RRR, normal S1, S2. ?Vascular:  ?Vessel Right Left  ?Radial Palpable Palpable  ? ?Musculoskeletal: M/S 5/5 throughout.  Extremities without ischemic changes.  No deformity or atrophy.  ?Neurologic: Sensation grossly intact in extremities.   Symmetrical.  Speech is fluent. Motor exam as listed above. ?Psychiatric: Judgment intact, Mood & affect appropriate for pt's clinical situation. ?Dermatologic: No rashes or ulcers noted.  No cellulitis or open wounds. ? ? ? ? ? ?CBC ?Lab Results  ?Component Value Date  ? WBC 5.6 11/25/2021  ? HGB 12.6 11/25/2021  ? HCT 41.2 11/25/2021  ? MCV 96.0 11/25/2021  ? PLT 173 11/25/2021  ? ? ?BMET ?   ?Component Value Date/Time  ? NA  141 11/25/2021 1400  ? NA 140 12/30/2013 0554  ? K 3.6 11/25/2021 1400  ? K 3.6 12/30/2013 0554  ? CL 103 11/25/2021 1400  ? CL 105 12/30/2013 0554  ? CO2 30 11/25/2021 1400  ? CO2 31 12/30/2013 0554  ? GLUCOSE 110 (H) 11/25/2021 1400  ? GLUCOSE 121 (H) 12/30/2013 0554  ? BUN 15 11/25/2021 1400  ? BUN 11 12/30/2013 0554  ? CREATININE 0.65 11/25/2021 1400  ? CREATININE 0.70 12/30/2013 0554  ? CALCIUM 9.5 11/25/2021 1400  ? CALCIUM 8.6 12/30/2013 0554  ? GFRNONAA >60 11/25/2021 1400  ? GFRNONAA >60 12/30/2013 0554  ? GFRAA >60 02/19/2020 1618  ? GFRAA >60 12/30/2013 0554  ? ?Estimated Creatinine Clearance: 54.6 mL/min (by C-G formula based on SCr of 0.65 mg/dL). ? ?COAG ?Lab Results  ?Component Value Date  ? INR 0.93 09/28/2018  ? INR 0.96 11/23/2017  ? ? ?Radiology ?No results found. ? ? ?Assessment/Plan ?1. Carotid stenosis. Right CEA today.  Risks and benefits discussed ?2. DM. Stable on outpatient medications and blood glucose control important in reducing the progression of atherosclerotic disease. Also, involved in wound healing. On appropriate medications. ?3. Hypertension. Stable on outpatient medications and blood pressure control important in reducing the progression of atherosclerotic disease. On appropriate oral medications. ? ? ? ?Leotis Pain, MD ? ?12/02/2021 ?7:33 AM ? ? ?  ? ?

## 2021-12-02 NOTE — Anesthesia Postprocedure Evaluation (Deleted)
Anesthesia Post Note ? ?Patient: CHONDA BANEY ? ?Procedure(s) Performed: ENDARTERECTOMY CAROTID (Right: Neck) ? ?Patient location during evaluation: ICU ?Anesthesia Type: General ?Level of consciousness: awake, awake and alert, oriented and patient cooperative ?Pain management: pain level controlled ?Vital Signs Assessment: post-procedure vital signs reviewed and stable ?Respiratory status: spontaneous breathing, nonlabored ventilation and respiratory function stable ?Cardiovascular status: stable ?Postop Assessment: no apparent nausea or vomiting ?Anesthetic complications: no ? ? ?No notable events documented. ? ? ?Last Vitals:  ?Vitals:  ? 12/02/21 1135 12/02/21 1200  ?BP:  (!) 136/53  ?Pulse:  66  ?Resp: 14 14  ?Temp: 36.5 ?C   ?SpO2:  94%  ?  ?Last Pain:  ?Vitals:  ? 12/02/21 1135  ?TempSrc: Axillary  ?PainSc:   ? ? ?  ?  ?  ?  ?  ?  ? ?Kimberley Dastrup,  Elta [place] R ? ? ? ? ?

## 2021-12-02 NOTE — Op Note (Signed)
[facility] ? ? ?OPERATIVE NOTE ? ?PROCEDURE:   ?1.  Right carotid endarterectomy with CorMatrix arterial patch reconstruction ? ?PRE-OPERATIVE DIAGNOSIS: 1.  High grade right carotid stenosis ? ? ?POST-OPERATIVE DIAGNOSIS: same as above  ? ?SURGEON: Leotis Pain, MD ? ?ASSISTANT(S): none ? ?ANESTHESIA: general ? ?ESTIMATED BLOOD LOSS: 39 cc ? ?FINDING(S): ?1.  right carotid plaque. ? ?SPECIMEN(S):  Carotid plaque (sent to Pathology) ? ?INDICATIONS:   ?Mary [name] is a 87 y.o. female who presents with right carotid stenosis of >75%.  I discussed with the patient the risks, benefits, and alternatives to carotid endarterectomy.  I discussed the differences between carotid stenting and carotid endarterectomy. I discussed the procedural details of carotid endarterectomy with the patient.  The patient is aware that the risks of carotid endarterectomy include but are not limited to: bleeding, infection, stroke, myocardial infarction, death, cranial nerve injuries both temporary and permanent, neck hematoma, possible airway compromise, labile blood pressure post-operatively, cerebral hyperperfusion syndrome, and possible need for additional interventions in the future. The patient is aware of the risks and agrees to proceed forward with the procedure. ? ?DESCRIPTION: ?After full informed written consent was obtained from the patient, the patient was brought back to the operating room and placed supine upon the operating table.  Prior to induction, the patient received IV antibiotics.  After obtaining adequate anesthesia, the patient was placed into a modified beach chair position with a shoulder roll in place and the patient's neck slightly hyperextended and rotated away from the surgical site.  The patient was prepped in the standard fashion for a carotid endarterectomy.  I made an incision anterior to the sternocleidomastoid muscle and dissected down through the subcutaneous tissue.  The platysmas  was opened with electrocautery.  Then I dissected down to the internal jugular vein and facial vein.  The facial vein is ligated and divided between 2-0 silk ties.  This was dissected posteriorly until I obtained visualization of the common carotid artery.  This was dissected out and then a vessel loop was placed around the common carotid artery.  I then dissected in a periadventitial fashion along the common carotid artery up to the bifurcation.  I then identified the external carotid artery and the superior thyroid artery.  I placed a vessel loop around the superior thyroid artery, and I also dissected out the external carotid artery and placed a vessel loop around it. In the process of this dissection, the hypoglossal nerve was identified and protected from harm.  I then dissected out the internal carotid artery until I identified an area in the internal carotid artery clearly above the stenosis.  I dissected slightly distal to this area, and placed a vessel loop around the artery.  At this point, we gave the patient 6000 units of intravenous heparin.  After this was allowed to circulate for several minutes, I pulled up control on the vessel loops to clamp the internal carotid artery, external carotid artery, superior thyroid artery, and then the common carotid artery.  I then made an arteriotomy in the common carotid artery with a 11 blade, and extended the arteriotomy with a Potts scissor down into the common carotid artery, then I carried the arteriotomy through the bifurcation into the internal carotid artery until I reached an area that was not diseased.  At this point, I took the [place] shunt that previously been prepared and I inserted it into the internal carotid artery first, and then into the common  carotid artery taking care to flush and de-air prior to release of control. At this point, I started the endarterectomy in the common carotid artery with a [place] elevator and carried this  dissection down into the common carotid artery circumferentially.  Then I transected the plaque at a segment where it was adherent and transected the plaque with Potts scissors.  I then carried this dissection up into the external carotid artery.  The plaque was extracted by unclamping the external carotid artery and performing an eversion endarterectomy.  The dissection was then carried into the internal carotid artery where a nice feathered end point was created with gentle traction.  I passed the plaque off the field as a specimen. At this point I removed all loose flecks and remaining disease possible.  At this point, I was satisfied that the minimal remaining disease was densely adherent to the wall and wall integrity was intact. The distal endpoint was tacked down with two 7-0 Prolene sutures.  I then fashioned a CorMatrix arterial patch for the artery and sewed it in place with two running stitch of 6-0 Prolene.  I started at the distal endpoint and ran one half the length of the arteriotomy.  I then cut and beveled the patch to an appropriate length to match the arteriotomy.  I started the second 6-0 Prolene at the proximal end point.  The medial suture line was completed and the lateral suture line was run approximately one quarter the length of the arteriotomy.  Prior to completing this patch angioplasty, I removed the shunt first from the internal carotid artery, from which there was excellent backbleeding, and clamped it.  Then I removed the shunt from the common carotid artery, from which there was excellent antegrade bleeding, and then clamped it.  At this point, I allowed the external carotid artery to backbleed, which was excellent.  Then I instilled heparinized saline in this patched artery and then completed the patch angioplasty in the usual fashion.  First, I released the clamp on the external carotid artery, then I released it on the common carotid artery.  After waiting a few seconds, I then  released it on the internal carotid artery. Several minutes of pressure were held and 6-0 Prolene patch sutures were used as need for hemostasis.  At this point, I placed Surgicel and Evicel topical hemostatic agents.  There was no more active bleeding in the surgical site.  The sternocleidomastoid space was closed with three interrupted 3-0 Vicryl sutures. I then reapproximated the platysma muscle with a running stitch of 3-0 Vicryl.  The skin was then closed with a running subcuticular 4-0 Monocryl.  The skin was then cleaned, dried and Dermabond was used to reinforce the skin closure.  The patient awakened and was taken to the recovery room in stable condition, following commands and moving all four extremities without any apparent deficits. ?   ?COMPLICATIONS: none ? ?CONDITION: stable ? ?Leotis Pain ? ?12/02/2021, 10:10 AM ? ? ? ?This note was created with Dragon Medical transcription system. Any errors in dictation are purely unintentional.  ?

## 2021-12-03 LAB — BASIC METABOLIC PANEL
Anion gap: 3 — ABNORMAL LOW (ref 5–15)
BUN: 15 mg/dL (ref 8–23)
CO2: 27 mmol/L (ref 22–32)
Calcium: 8.4 mg/dL — ABNORMAL LOW (ref 8.9–10.3)
Chloride: 108 mmol/L (ref 98–111)
Creatinine, Ser: 0.55 mg/dL (ref 0.44–1.00)
GFR, Estimated: >60 mL/min (ref >60)
Glucose, Bld: 133 mg/dL — ABNORMAL HIGH (ref 70–99)
Potassium: 4.3 mmol/L (ref 3.5–5.1)
Sodium: 138 mmol/L (ref 135–145)

## 2021-12-03 LAB — CBC
HCT: 36.7 % (ref 36.0–46.0)
Hemoglobin: 10.9 g/dL — ABNORMAL LOW (ref 12.0–15.0)
MCH: 29.9 pg (ref 26.0–34.0)
MCHC: 29.7 g/dL — ABNORMAL LOW (ref 30.0–36.0)
MCV: 100.5 fL — ABNORMAL HIGH (ref 80.0–100.0)
Platelets: 122 10*3/uL — ABNORMAL LOW (ref 150–400)
RBC: 3.65 MIL/uL — ABNORMAL LOW (ref 3.87–5.11)
RDW: 14 % (ref 11.5–15.5)
WBC: 7.6 10*3/uL (ref 4.0–10.5)
nRBC: 0 % (ref 0.0–0.2)

## 2021-12-03 MED ORDER — ROSUVASTATIN CALCIUM 5 MG PO TABS: 5.0000 mg | ORAL_TABLET | Freq: Every day | ORAL | 3 refills | Status: DC

## 2021-12-03 MED ORDER — OXYCODONE-ACETAMINOPHEN 5-325 MG PO TABS
1.0000 | ORAL_TABLET | ORAL | 0 refills | Status: DC | PRN
Start: 2021-12-03 — End: 2023-07-23

## 2021-12-03 MED ORDER — FAMOTIDINE 20 MG PO TABS
20.0000 mg | ORAL_TABLET | Freq: Two times a day (BID) | ORAL | Status: DC
  Administered 2021-12-03: 20 mg via ORAL
  Filled 2021-12-03: qty 1

## 2021-12-03 MED ORDER — CLOPIDOGREL BISULFATE 75 MG PO TABS: 75.0000 mg | ORAL_TABLET | Freq: Every day | ORAL | 6 refills | Status: DC

## 2021-12-03 NOTE — Progress Notes (Signed)
?   12/03/21 0700  ?Clinical Encounter Type  ?Visited With Patient  ?Visit Type Initial  ?Referral From Nurse  ?Consult/Referral To Chaplain  ? ?Chaplain followed up per on call Chaplain. Chaplain provided compassionate presence and prayer. ?

## 2021-12-03 NOTE — Progress Notes (Signed)
PHARMACIST - PHYSICIAN COMMUNICATION ? ?CONCERNING: IV to Oral Route Change Policy ? ?RECOMMENDATION: ?This patient is receiving famotidine by the intravenous route.  Based on criteria approved by the Pharmacy and Therapeutics Committee, the intravenous medication(s) is/are being converted to the equivalent oral dose form(s). ? ? ?DESCRIPTION: ?These criteria include: ?The patient is eating (either orally or via tube) and/or has been taking other orally administered medications for a least 24 hours ?The patient has no evidence of active gastrointestinal bleeding or impaired GI absorption (gastrectomy, short bowel, patient on TNA or NPO). ? ?If you have questions about this conversion, please contact the Pharmacy Department  ? ?Benita Gutter, RPH ?12/03/2021 8:00 AM  ?

## 2021-12-03 NOTE — Plan of Care (Signed)
?  Problem: Activity: ?Goal: Ability to return to baseline activity level will improve ?Outcome: Adequate for Discharge ?  ?Problem: Cardiovascular: ?Goal: Ability to achieve and maintain adequate cardiovascular perfusion will improve ?Outcome: Adequate for Discharge ?Goal: Vascular access site(s) Level 0-1 will be maintained ?Outcome: Adequate for Discharge ?  ?Problem: Health Behavior/Discharge Planning: ?Goal: Ability to safely manage health-related needs after discharge will improve ?Outcome: Adequate for Discharge ?  ?

## 2021-12-03 NOTE — Progress Notes (Signed)
?  Transition of Care (TOC) Screening Note ? ? ?Patient Details  ?Name: Mary [name] ?Date of Birth: May 11, 1935 ? ? ?Transition of Care (TOC) CM/SW Contact:    ?Shelbie Hutching, RN ?Phone Number: ?12/03/2021, 1:09 PM ? ? ? ?Transition of Care Department [facility]) has reviewed patient and no TOC needs have been identified at this time. We will continue to monitor patient advancement through interdisciplinary progression rounds. If new patient transition needs arise, please place a TOC consult. ?  ?

## 2021-12-03 NOTE — Anesthesia Postprocedure Evaluation (Signed)
Anesthesia Post Note ? ?Patient: Mary [name] ? ?Procedure(s) Performed: ENDARTERECTOMY CAROTID (Right: Neck) ? ?Patient location during evaluation: ICU ?Anesthesia Type: General ?Level of consciousness: awake ?Vital Signs Assessment: post-procedure vital signs reviewed and stable ?Respiratory status: spontaneous breathing ?Cardiovascular status: stable ?Anesthetic complications: no ? ? ?No notable events documented. ? ? ?Last Vitals:  ?Vitals:  ? 12/03/21 0500 12/03/21 0600  ?BP: (!) 111/42 (!) 113/55  ?Pulse: (!) 52 (!) 54  ?Resp: 14 12  ?Temp:    ?SpO2: 98% 99%  ?  ?Last Pain:  ?Vitals:  ? 12/03/21 0400  ?TempSrc: Axillary  ?PainSc: Asleep  ? ? ?  ?  ?  ?  ?  ?  ? ?Lerry Liner ? ? ? ? ?

## 2021-12-03 NOTE — Discharge Summary (Signed)
? ?[facility]    ?Discharge Summary ? ? ? ?Patient ID:  ?Mary [name] ?MRN: [id number] ?DOB/AGE: February 21, 1935 87 y.o. ? ?Admit date: 12/02/2021 ?Discharge date: 12/03/2021 ?Date of Surgery: 12/02/2021 ?Surgeon: Surgeon(s): ?Algernon Huxley, MD ? ?Admission Diagnosis: ?Carotid stenosis, right [I65.21] ? ?Discharge Diagnoses:  ?Carotid stenosis, right [I65.21] ? ?Secondary Diagnoses: ?Past Medical History:  ?Diagnosis Date  ? Acid reflux   ? Anemia   ? Arthritis   ? Asthma   ? Atherosclerosis of abdominal aorta ([place])   ? B12 deficiency   ? Clostridium difficile infection   ? H/O  ? Coronary artery disease   ? DDD (degenerative disc disease), lumbar   ? Depression   ? Diabetes ([place])   ? type 2  ? Diabetic retinopathy ([place])   ? Dysrhythmia   ? Heart murmur   ? HLD (hyperlipidemia)   ? HTN (hypertension)   ? Hypotension   ? when get up too quickly  ? Hypothyroidism   ? Pneumonia   ? PONV (postoperative nausea and vomiting)   ? Pulmonary nodule   ? right middle lobe on CT scan  ? Pure hypercholesterolemia   ? RA (rheumatoid arthritis) ([place])   ? Sleep apnea   ? Spinal stenosis   ? Urinary urgency   ? ? ?Procedure(s): ?ENDARTERECTOMY CAROTID ? ?Discharged Condition: good ? ?HPI:  ?Mary [name] is an 87 year old female that underwent right carotid endarterectomy on 12/02/2021.  She had an estimated 80% carotid stenosis prior to intervention.  Postoperatively she had no significant issues such as issues with blood pressure or bleeding.  She is ambulating well. ? ?Hospital Course:  ?Mary [name] is a 87 y.o. female is S/P Right carotid endarterectomy ?Procedure(s): ?ENDARTERECTOMY CAROTID ?Extubated: POD # 0 ?Physical exam: Incision well approximated with clean dry and intact ?Post-op wounds clean, dry, intact or healing well ?Pt. Ambulating, voiding and taking PO diet without difficulty. ?Pt pain controlled with PO pain meds. ?Labs as below ?Complications:none ? ?Consults:  ? ? ?Significant Diagnostic  Studies: ?CBC ?Lab Results  ?Component Value Date  ? WBC 7.6 12/03/2021  ? HGB 10.9 (L) 12/03/2021  ? HCT 36.7 12/03/2021  ? MCV 100.5 (H) 12/03/2021  ? PLT 122 (L) 12/03/2021  ? ? ?BMET ?   ?Component Value Date/Time  ? NA 138 12/03/2021 0543  ? NA 140 12/30/2013 0554  ? K 4.3 12/03/2021 0543  ? K 3.6 12/30/2013 0554  ? CL 108 12/03/2021 0543  ? CL 105 12/30/2013 0554  ? CO2 27 12/03/2021 0543  ? CO2 31 12/30/2013 0554  ? GLUCOSE 133 (H) 12/03/2021 0543  ? GLUCOSE 121 (H) 12/30/2013 0554  ? BUN 15 12/03/2021 0543  ? BUN 11 12/30/2013 0554  ? CREATININE 0.55 12/03/2021 0543  ? CREATININE 0.70 12/30/2013 0554  ? CALCIUM 8.4 (L) 12/03/2021 0543  ? CALCIUM 8.6 12/30/2013 0554  ? GFRNONAA >60 12/03/2021 0543  ? GFRNONAA >60 12/30/2013 0554  ? GFRAA >60 02/19/2020 1618  ? GFRAA >60 12/30/2013 0554  ? ?COAG ?Lab Results  ?Component Value Date  ? INR 0.93 09/28/2018  ? INR 0.96 11/23/2017  ? ? ? ?Disposition:  ?Discharge to :Home ? ?Allergies as of 12/03/2021   ? ?   Reactions  ? Cephalexin Hives  ? Nitrofurantoin   ? Other reaction(s): Other (See Comments) ?Other Reaction: measles-like lesions  ? Sulfa Antibiotics Hives  ? Atorvastatin Other (See Comments)  ? Muscle spasms/ muscle pain  ? ?  ? ?  ?  Medication List  ?  ? ?TAKE these medications   ? ?acetaminophen 500 MG tablet ?Commonly known as: TYLENOL ?Take 500 mg by mouth every 6 (six) hours as needed. ?  ?amLODipine 10 MG tablet ?Commonly known as: NORVASC ?Take 1 tablet (10 mg total) by mouth daily. ?  ?aspirin 81 MG EC tablet ?Take 1 tablet (81 mg total) by mouth daily. Swallow whole. ?  ?azelastine 0.05 % ophthalmic solution ?Commonly known as: OPTIVAR ?Place 1 drop into both eyes daily as needed (allergies). ?  ?busPIRone 10 MG tablet ?Commonly known as: BUSPAR ?Take 20 mg by mouth 2 (two) times daily. ?  ?clopidogrel 75 MG tablet ?Commonly known as: PLAVIX ?Take 1 tablet (75 mg total) by mouth daily at 6 (six) AM. ?Start taking on: December 04, 2021 ?  ?ezetimibe 10  MG tablet ?Commonly known as: ZETIA ?Take 10 mg by mouth at bedtime. ?  ?furosemide 20 MG tablet ?Commonly known as: LASIX ?Take 1 tablet (20 mg total) by mouth daily. ?  ?hydrALAZINE 100 MG tablet ?Commonly known as: APRESOLINE ?Take 1 tablet (100 mg total) by mouth 3 (three) times daily. ?  ?hydrochlorothiazide 25 MG tablet ?Commonly known as: HYDRODIURIL ?Take 1 tablet (25 mg total) by mouth daily. ?  ?levothyroxine 75 MCG tablet ?Commonly known as: SYNTHROID ?Take 75 mcg by mouth daily. ?  ?metFORMIN 500 MG 24 hr tablet ?Commonly known as: GLUCOPHAGE-XR ?Take 500 mg by mouth daily. ?  ?omeprazole 40 MG capsule ?Commonly known as: PRILOSEC ?Take 40 mg by mouth daily. ?  ?oxyCODONE-acetaminophen 5-325 MG tablet ?Commonly known as: PERCOCET/ROXICET ?Take 1-2 tablets by mouth every 4 (four) hours as needed for moderate pain. ?  ?PARoxetine 40 MG tablet ?Commonly known as: PAXIL ?Take 40 mg by mouth every evening. ?  ?rosuvastatin 5 MG tablet ?Commonly known as: CRESTOR ?Take 1 tablet (5 mg total) by mouth daily. ?Start taking on: December 04, 2021 ?  ?traZODone 50 MG tablet ?Commonly known as: DESYREL ?Take 1 tablet (50 mg total) by mouth at bedtime as needed for sleep. ?What changed: how much to take ?  ?valsartan 160 MG tablet ?Commonly known as: DIOVAN ?Take 160 mg by mouth daily. ?  ?vitamin B-12 500 MCG tablet ?Commonly known as: CYANOCOBALAMIN ?Take 500 mcg by mouth every other day. ?  ?Vitron-C 65-125 MG Tabs ?Generic drug: Iron-Vitamin C ?Take 1 tablet by mouth daily. ?What changed: when to take this ?  ? ?  ? ?Verbal and written Discharge instructions given to the patient. Wound care per Discharge AVS ? Follow-up Information   ? ? Kris Hartmann, NP Follow up in 4 week(s).   ?Specialty: Vascular Surgery ?Why: See FB or JD with Carotid ?Contact information: ?[address] ?[place] [address] ?[phone] ? ? ?  ?  ? ?  ?  ? ?  ? ? ?Signed: ?Kris Hartmann, NP ? ?12/03/2021, 3:31 PM ? ?  ?

## 2021-12-09 LAB — SURGICAL PATHOLOGY

## 2021-12-10 ENCOUNTER — Other Ambulatory Visit (HOSPITAL_BASED_OUTPATIENT_CLINIC_OR_DEPARTMENT_OTHER): Payer: Self-pay | Admitting: Osteopathic Medicine

## 2021-12-11 ENCOUNTER — Telehealth (INDEPENDENT_AMBULATORY_CARE_PROVIDER_SITE_OTHER): Payer: Self-pay | Admitting: Nurse Practitioner

## 2021-12-11 NOTE — Telephone Encounter (Signed)
Patient return call she was made aware with medical advice and verbalized understanding ?

## 2021-12-11 NOTE — Telephone Encounter (Signed)
Patient had right carotid endarterectomy on 12/02/21 ?

## 2021-12-11 NOTE — Telephone Encounter (Signed)
Patient called and stated the left side of her head hurts so bad that she cannot move her head.  She states the pain medicine she has doesn't even touch her pain.  Please advise ?

## 2021-12-11 NOTE — Telephone Encounter (Signed)
Left a message on patient voicemail to return call to the office pertaining to note below  ?

## 2022-01-01 ENCOUNTER — Ambulatory Visit (INDEPENDENT_AMBULATORY_CARE_PROVIDER_SITE_OTHER): Payer: Medicare PPO

## 2022-01-01 ENCOUNTER — Other Ambulatory Visit (INDEPENDENT_AMBULATORY_CARE_PROVIDER_SITE_OTHER): Payer: Self-pay | Admitting: Vascular Surgery

## 2022-01-01 ENCOUNTER — Encounter (INDEPENDENT_AMBULATORY_CARE_PROVIDER_SITE_OTHER): Payer: Self-pay | Admitting: Nurse Practitioner

## 2022-01-01 ENCOUNTER — Ambulatory Visit (INDEPENDENT_AMBULATORY_CARE_PROVIDER_SITE_OTHER): Payer: Medicare PPO | Admitting: Nurse Practitioner

## 2022-01-01 VITALS — BP 200/72 | HR 51 | Resp 14 | Ht 65.0 in | Wt 170.0 lb

## 2022-01-01 DIAGNOSIS — I679 Cerebrovascular disease, unspecified: Secondary | ICD-10-CM

## 2022-01-01 DIAGNOSIS — E1169 Type 2 diabetes mellitus with other specified complication: Secondary | ICD-10-CM | POA: Insufficient documentation

## 2022-01-01 DIAGNOSIS — E785 Hyperlipidemia, unspecified: Secondary | ICD-10-CM

## 2022-01-01 DIAGNOSIS — I6521 Occlusion and stenosis of right carotid artery: Secondary | ICD-10-CM

## 2022-01-01 DIAGNOSIS — I10 Essential (primary) hypertension: Secondary | ICD-10-CM

## 2022-01-02 ENCOUNTER — Encounter (INDEPENDENT_AMBULATORY_CARE_PROVIDER_SITE_OTHER): Payer: Self-pay | Admitting: Nurse Practitioner

## 2022-01-02 NOTE — Progress Notes (Signed)
? ?Subjective:  ? ? [name], female    DOB: 03/09/1935, 87 y.o.   MRN: [id number] ?Chief Complaint  ?Patient presents with  ? Follow-up  ?  ultrasound  ? ? ?The patient is seen for follow up evaluation of carotid stenosis status post right carotid endarterectomy on 12/02/2021.  There were no post operative problems or complications related to the surgery.  The patient denies neck or incisional pain. ? ?The patient denies interval amaurosis fugax. There is no recent history of TIA symptoms or focal motor deficits. There is no prior documented CVA. ? ?The patient denies headache. ? ?The patient is taking enteric-coated aspirin 81 mg daily. ? ?No recent shortening of the patient's walking distance or new symptoms consistent with claudication.  No history of rest pain symptoms. No new ulcers or wounds of the lower extremities have occurred. ? ?There is no history of DVT, PE or superficial thrombophlebitis. ?No recent episodes of angina or shortness of breath documented.  ?  ?Today noninvasive studies show a widely patent right carotid with increased velocities due to postop tortuosity.  Overall 1 to 39% stenosis.  There is also 1 to 39% stenosis of the left ICA. ? ? ?Review of Systems  ?Skin:  Positive for wound.  ?All other systems reviewed and are negative. ? ?   ?Objective:  ? Physical Exam ?Vitals reviewed.  ?HENT:  ?   Head: Normocephalic.  ?Cardiovascular:  ?   Rate and Rhythm: Normal rate.  ?   Pulses: Normal pulses.  ?Pulmonary:  ?   Effort: Pulmonary effort is normal.  ?Neurological:  ?   Mental Status: She is alert and oriented to person, place, and time.  ?   Motor: Weakness present.  ?Psychiatric:     ?   Mood and Affect: Mood normal.     ?   Behavior: Behavior normal.     ?   Thought Content: Thought content normal.     ?   Judgment: Judgment normal.  ? ? ?BP (!) 200/72   Pulse (!) 51   Resp 14   Ht '5\' 5"'$  (1.651 m)   Wt 170 lb (77.1 kg)   BMI 28.29 kg/m?  ? ?Past Medical History:   ?Diagnosis Date  ? Acid reflux   ? Anemia   ? Arthritis   ? Asthma   ? Atherosclerosis of abdominal aorta ([place])   ? B12 deficiency   ? Clostridium difficile infection   ? H/O  ? Coronary artery disease   ? DDD (degenerative disc disease), lumbar   ? Depression   ? Diabetes ([place])   ? type 2  ? Diabetic retinopathy ([place])   ? Dysrhythmia   ? Heart murmur   ? HLD (hyperlipidemia)   ? HTN (hypertension)   ? Hypotension   ? when get up too quickly  ? Hypothyroidism   ? Pneumonia   ? PONV (postoperative nausea and vomiting)   ? Pulmonary nodule   ? right middle lobe on CT scan  ? Pure hypercholesterolemia   ? RA (rheumatoid arthritis) ([place])   ? Sleep apnea   ? Spinal stenosis   ? Urinary urgency   ? ? ?Social History  ? ?Socioeconomic History  ? Marital status: Widowed  ?  Spouse name: Not on file  ? Number of children: Not on file  ? Years of education: Not on file  ? Highest education level: Not on file  ?Occupational History  ? Occupation: retired  ?  Tobacco Use  ? Smoking status: Never  ? Smokeless tobacco: Never  ?Vaping Use  ? Vaping Use: Never used  ?Substance and Sexual Activity  ? Alcohol use: No  ?  Alcohol/week: 0.0 standard drinks  ? Drug use: No  ? Sexual activity: Not Currently  ?Other Topics Concern  ? Not on file  ?Social History Narrative  ? Lives with daughter  ? ?Social Determinants of Health  ? ?Financial Resource Strain: Not on file  ?Food Insecurity: Not on file  ?Transportation Needs: Not on file  ?Physical Activity: Not on file  ?Stress: Not on file  ?Social Connections: Not on file  ?Intimate Partner Violence: Not on file  ? ? ?Past Surgical History:  ?Procedure Laterality Date  ? ABDOMINAL HYSTERECTOMY    ? CATARACT EXTRACTION, BILATERAL    ? COLONOSCOPY N/A 07/29/2021  ? Procedure: COLONOSCOPY;  Surgeon: Toledo, Benay Pike, MD;  Location: [facility] ENDOSCOPY;  Service: Gastroenterology;  Laterality: N/A;  ? CYSTOSCOPY    ? ENDARTERECTOMY Right 12/02/2021  ? Procedure: ENDARTERECTOMY CAROTID;  Surgeon:  Algernon Huxley, MD;  Location: [facility] ORS;  Service: Vascular;  Laterality: Right;  ? ESOPHAGOGASTRODUODENOSCOPY N/A 07/29/2021  ? Procedure: ESOPHAGOGASTRODUODENOSCOPY (EGD);  Surgeon: Toledo, Benay Pike, MD;  Location: [facility] ENDOSCOPY;  Service: Gastroenterology;  Laterality: N/A;  DM  ? HIP ARTHROPLASTY Right 09/24/2015  ? Procedure: ARTHROPLASTY BIPOLAR HIP (HEMIARTHROPLASTY);  Surgeon: Corky Mull, MD;  Location: [facility] ORS;  Service: Orthopedics;  Laterality: Right;  ? HIP ARTHROPLASTY Left 09/29/2018  ? Procedure: ARTHROPLASTY BIPOLAR HIP (HEMIARTHROPLASTY) LEFT;  Surgeon: Corky Mull, MD;  Location: [facility] ORS;  Service: Orthopedics;  Laterality: Left;  ? KNEE ARTHROSCOPY W/ AUTOGENOUS CARTILAGE IMPLANTATION (ACI) PROCEDURE    ? TOTAL KNEE ARTHROPLASTY Left 12/01/2017  ? Procedure: TOTAL KNEE ARTHROPLASTY;  Surgeon: Corky Mull, MD;  Location: [facility] ORS;  Service: Orthopedics;  Laterality: Left;  ? TOTAL KNEE ARTHROPLASTY Right 2014  ? VARICOSE VEIN SURGERY Left   ? leg  ? ? ?Family History  ?Problem Relation Age of Onset  ? Kidney disease Brother   ?     also nephew  ? Heart disease Mother   ? Heart disease Father   ? Prostate cancer Neg Hx   ? Bladder Cancer Neg Hx   ? Breast cancer Neg Hx   ? Kidney cancer Neg Hx   ? ? ?Allergies  ?Allergen Reactions  ? Cephalexin Hives  ? Nitrofurantoin   ?  Other reaction(s): Other (See Comments) ?Other Reaction: measles-like lesions  ? Sulfa Antibiotics Hives  ? Atorvastatin Other (See Comments)  ?  Muscle spasms/ muscle pain  ? ? ? ?  Latest Ref Rng & Units 12/03/2021  ?  5:43 AM 11/25/2021  ?  2:00 PM 09/18/2021  ?  7:10 AM  ?CBC  ?WBC 4.0 - 10.5 K/uL 7.6   5.6   5.4    ?Hemoglobin 12.0 - 15.0 g/dL 10.9   12.6   12.6    ?Hematocrit 36.0 - 46.0 % 36.7   41.2   41.5    ?Platelets 150 - 400 K/uL 122   173   144    ? ? ? ? ?CMP  ?   ?Component Value Date/Time  ? NA 138 12/03/2021 0543  ? NA 140 12/30/2013 0554  ? K 4.3 12/03/2021 0543  ? K 3.6 12/30/2013 0554  ? CL 108 12/03/2021  0543  ? CL 105 12/30/2013 0554  ? CO2 27 12/03/2021 0543  ?  CO2 31 12/30/2013 0554  ? GLUCOSE 133 (H) 12/03/2021 0543  ? GLUCOSE 121 (H) 12/30/2013 0554  ? BUN 15 12/03/2021 0543  ? BUN 11 12/30/2013 0554  ? CREATININE 0.55 12/03/2021 0543  ? CREATININE 0.70 12/30/2013 0554  ? CALCIUM 8.4 (L) 12/03/2021 0543  ? CALCIUM 8.6 12/30/2013 0554  ? PROT 7.6 05/19/2017 1440  ? PROT 7.4 12/29/2013 0018  ? ALBUMIN 3.9 05/19/2017 1440  ? ALBUMIN 3.5 12/29/2013 0018  ? AST 22 05/19/2017 1440  ? AST 28 12/29/2013 0018  ? ALT 9 (L) 05/19/2017 1440  ? ALT 13 12/29/2013 0018  ? ALKPHOS 82 05/19/2017 1440  ? ALKPHOS 131 (H) 12/29/2013 0018  ? BILITOT 0.2 (L) 05/19/2017 1440  ? BILITOT 0.3 12/29/2013 0018  ? GFRNONAA >60 12/03/2021 0543  ? GFRNONAA >60 12/30/2013 0554  ? GFRAA >60 02/19/2020 1618  ? GFRAA >60 12/30/2013 0554  ? ? ? ?No results found. ? ?   ?Assessment & Plan:  ? ?1. Stenosis of right carotid artery ?Recommend: ? ?The patient is s/p successful right CEA ? ?Duplex ultrasound preoperatively shows 1-39% contralateral stenosis. ? ?Continue antiplatelet therapy as prescribed ?Continue management of CAD, HTN and Hyperlipidemia ?Healthy heart diet,  encouraged exercise at least 4 times per week ? ?Follow up in 3 months with duplex ultrasound and physical exam based on the patient's carotid surgery and 50% stenosis of the right carotid artery   ? ?2. Hyperlipidemia, unspecified hyperlipidemia type ?Continue statin as ordered and reviewed, no changes at this time  ? ?3. Primary hypertension ?Patient blood pressure is extremely elevated today.  She notes that this happens when she visits the doctor.  She is advised to check her blood pressure when she returns home and if it remains elevated she should reach out to her primary care physician or possibly visit the emergency room. ? ?4. Type 2 diabetes mellitus with hyperlipidemia ([place]) ?Continue hypoglycemic medications as already ordered, these medications have been reviewed and  there are no changes at this time. ? ?Hgb A1C to be monitored as already arranged by primary service  ? ? ?Current Outpatient Medications on File Prior to Visit  ?Medication Sig Dispense Refill  ? acetaminophen

## 2022-01-05 ENCOUNTER — Other Ambulatory Visit (INDEPENDENT_AMBULATORY_CARE_PROVIDER_SITE_OTHER): Payer: Self-pay | Admitting: Nurse Practitioner

## 2022-01-05 ENCOUNTER — Other Ambulatory Visit (HOSPITAL_BASED_OUTPATIENT_CLINIC_OR_DEPARTMENT_OTHER): Payer: Self-pay | Admitting: Osteopathic Medicine

## 2022-01-14 ENCOUNTER — Other Ambulatory Visit (INDEPENDENT_AMBULATORY_CARE_PROVIDER_SITE_OTHER): Payer: Self-pay | Admitting: Nurse Practitioner

## 2022-01-19 DIAGNOSIS — M189 Osteoarthritis of first carpometacarpal joint, unspecified: Secondary | ICD-10-CM | POA: Insufficient documentation

## 2022-04-13 ENCOUNTER — Encounter (INDEPENDENT_AMBULATORY_CARE_PROVIDER_SITE_OTHER): Payer: Self-pay | Admitting: Vascular Surgery

## 2022-04-13 ENCOUNTER — Other Ambulatory Visit (INDEPENDENT_AMBULATORY_CARE_PROVIDER_SITE_OTHER): Payer: Self-pay | Admitting: Nurse Practitioner

## 2022-04-13 ENCOUNTER — Ambulatory Visit (INDEPENDENT_AMBULATORY_CARE_PROVIDER_SITE_OTHER): Payer: Medicare PPO | Admitting: Nurse Practitioner

## 2022-04-13 ENCOUNTER — Ambulatory Visit (INDEPENDENT_AMBULATORY_CARE_PROVIDER_SITE_OTHER): Payer: Medicare PPO | Admitting: Vascular Surgery

## 2022-04-13 ENCOUNTER — Ambulatory Visit (INDEPENDENT_AMBULATORY_CARE_PROVIDER_SITE_OTHER): Payer: Medicare PPO

## 2022-04-13 VITALS — BP 112/70 | HR 118 | Resp 18 | Ht 66.0 in | Wt 163.0 lb

## 2022-04-13 DIAGNOSIS — I10 Essential (primary) hypertension: Secondary | ICD-10-CM

## 2022-04-13 DIAGNOSIS — I6523 Occlusion and stenosis of bilateral carotid arteries: Secondary | ICD-10-CM

## 2022-04-13 DIAGNOSIS — E785 Hyperlipidemia, unspecified: Secondary | ICD-10-CM

## 2022-04-13 DIAGNOSIS — E1142 Type 2 diabetes mellitus with diabetic polyneuropathy: Secondary | ICD-10-CM | POA: Diagnosis not present

## 2022-04-13 NOTE — Assessment & Plan Note (Signed)
Duplex today shows a widely patent right carotid endarterectomy and velocities in the 1 to 39% range on the left.  She is doing well.  Continue current medical regimen.  Recheck in 6 months.

## 2022-04-13 NOTE — Assessment & Plan Note (Signed)
lipid control important in reducing the progression of atherosclerotic disease. Continue statin therapy  

## 2022-04-13 NOTE — Progress Notes (Signed)
MRN : [id number]  Mary [name] is a 87 y.o. (07-27-1935) female who presents with chief complaint of  Chief Complaint  Patient presents with   Follow-up    ultrasound  .  History of Present Illness: Patient returns today in follow up of her carotid disease.  She is about 4 to 5 months status post right carotid endarterectomy for high-grade stenosis.  She is doing well today.  She denies any focal neurologic symptoms. Specifically, the patient denies amaurosis fugax, speech or swallowing difficulties, or arm or leg weakness or numbness. Duplex today shows a widely patent right carotid endarterectomy and velocities in the 1 to 39% range on the left.   Current Outpatient Medications  Medication Sig Dispense Refill   acetaminophen (TYLENOL) 500 MG tablet Take 500 mg by mouth every 6 (six) hours as needed.     amLODipine (NORVASC) 10 MG tablet Take 1 tablet (10 mg total) by mouth daily. 30 tablet 0   aspirin EC 81 MG EC tablet Take 1 tablet (81 mg total) by mouth daily. Swallow whole. 30 tablet 11   azelastine (OPTIVAR) 0.05 % ophthalmic solution Place 1 drop into both eyes daily as needed (allergies).     busPIRone (BUSPAR) 10 MG tablet Take 20 mg by mouth 2 (two) times daily.      carvedilol (COREG) 3.125 MG tablet Take by mouth.     clopidogrel (PLAVIX) 75 MG tablet Take 1 tablet (75 mg total) by mouth daily at 6 (six) AM. 30 tablet 6   ezetimibe (ZETIA) 10 MG tablet Take 10 mg by mouth at bedtime.     furosemide (LASIX) 20 MG tablet Take 1 tablet (20 mg total) by mouth daily. 30 tablet 0   hydrALAZINE (APRESOLINE) 100 MG tablet Take 1 tablet (100 mg total) by mouth 3 (three) times daily. 90 tablet 0   hydrochlorothiazide (HYDRODIURIL) 25 MG tablet Take 1 tablet (25 mg total) by mouth daily. 30 tablet 0   Iron-Vitamin C (VITRON-C) 65-125 MG TABS Take 1 tablet by mouth daily. (Patient taking differently: Take 1 tablet by mouth every other day.) 90 tablet 1   levothyroxine (SYNTHROID,  LEVOTHROID) 75 MCG tablet Take 75 mcg by mouth daily.      omeprazole (PRILOSEC) 40 MG capsule Take 40 mg by mouth daily.      oxyCODONE-acetaminophen (PERCOCET/ROXICET) 5-325 MG tablet Take 1-2 tablets by mouth every 4 (four) hours as needed for moderate pain. 30 tablet 0   pantoprazole (PROTONIX) 20 MG tablet Take by mouth.     PARoxetine (PAXIL) 40 MG tablet Take 40 mg by mouth every evening.     rosuvastatin (CRESTOR) 5 MG tablet Take 1 tablet (5 mg total) by mouth daily. 30 tablet 3   traZODone (DESYREL) 50 MG tablet Take 1 tablet (50 mg total) by mouth at bedtime as needed for sleep. (Patient taking differently: Take 25 mg by mouth at bedtime as needed for sleep.) 30 tablet 0   valsartan (DIOVAN) 160 MG tablet Take 160 mg by mouth daily.     vitamin B-12 (CYANOCOBALAMIN) 500 MCG tablet Take 500 mcg by mouth every other day.     metFORMIN (GLUCOPHAGE-XR) 500 MG 24 hr tablet Take 500 mg by mouth daily.  (Patient not taking: Reported on 04/13/2022)     No current facility-administered medications for this visit.    Past Medical History:  Diagnosis Date   Acid reflux    Anemia    Arthritis  Asthma    Atherosclerosis of abdominal aorta (HCC)    B12 deficiency    Clostridium difficile infection    H/O   Coronary artery disease    DDD (degenerative disc disease), lumbar    Depression    Diabetes ([place])    type 2   Diabetic retinopathy ([place])    Dysrhythmia    Heart murmur    HLD (hyperlipidemia)    HTN (hypertension)    Hypotension    when get up too quickly   Hypothyroidism    Pneumonia    PONV (postoperative nausea and vomiting)    Pulmonary nodule    right middle lobe on CT scan   Pure hypercholesterolemia    RA (rheumatoid arthritis) ([place])    Sleep apnea    Spinal stenosis    Urinary urgency     Past Surgical History:  Procedure Laterality Date   ABDOMINAL HYSTERECTOMY     CATARACT EXTRACTION, BILATERAL     COLONOSCOPY N/A 07/29/2021   Procedure: COLONOSCOPY;   Surgeon: Toledo, Benay Pike, MD;  Location: [facility];  Service: Gastroenterology;  Laterality: N/A;   CYSTOSCOPY     ENDARTERECTOMY Right 12/02/2021   Procedure: ENDARTERECTOMY CAROTID;  Surgeon: Algernon Huxley, MD;  Location: [facility];  Service: Vascular;  Laterality: Right;   ESOPHAGOGASTRODUODENOSCOPY N/A 07/29/2021   Procedure: ESOPHAGOGASTRODUODENOSCOPY (EGD);  Surgeon: Toledo, Benay Pike, MD;  Location: [facility];  Service: Gastroenterology;  Laterality: N/A;  DM   HIP ARTHROPLASTY Right 09/24/2015   Procedure: ARTHROPLASTY BIPOLAR HIP (HEMIARTHROPLASTY);  Surgeon: Corky Mull, MD;  Location: [facility];  Service: Orthopedics;  Laterality: Right;   HIP ARTHROPLASTY Left 09/29/2018   Procedure: ARTHROPLASTY BIPOLAR HIP (HEMIARTHROPLASTY) LEFT;  Surgeon: Corky Mull, MD;  Location: [facility];  Service: Orthopedics;  Laterality: Left;   KNEE ARTHROSCOPY W/ AUTOGENOUS CARTILAGE IMPLANTATION (ACI) PROCEDURE     TOTAL KNEE ARTHROPLASTY Left 12/01/2017   Procedure: TOTAL KNEE ARTHROPLASTY;  Surgeon: Corky Mull, MD;  Location: [facility];  Service: Orthopedics;  Laterality: Left;   TOTAL KNEE ARTHROPLASTY Right 2014   VARICOSE VEIN SURGERY Left    leg     Social History   Tobacco Use   Smoking status: Never   Smokeless tobacco: Never  Vaping Use   Vaping Use: Never used  Substance Use Topics   Alcohol use: No    Alcohol/week: 0.0 standard drinks of alcohol   Drug use: No       Family History  Problem Relation Age of Onset   Kidney disease Brother        also nephew   Heart disease Mother    Heart disease Father    Prostate cancer Neg Hx    Bladder Cancer Neg Hx    Breast cancer Neg Hx    Kidney cancer Neg Hx      Allergies  Allergen Reactions   Cephalexin Hives   Nitrofurantoin     Other reaction(s): Other (See Comments) Other Reaction: measles-like lesions   Sulfa Antibiotics Hives   Atorvastatin Other (See Comments)    Muscle spasms/ muscle pain     REVIEW OF SYSTEMS (Negative unless checked)   Constitutional: '[]'$ Weight loss  '[]'$ Fever  '[]'$ Chills Cardiac: '[]'$ Chest pain   '[]'$ Chest pressure   '[]'$ Palpitations   '[]'$ Shortness of breath when laying flat   '[]'$ Shortness of breath at rest   '[x]'$ Shortness of breath with exertion. Vascular:  '[]'$ Pain in legs with walking   '[]'$ Pain in legs at rest   '[]'$   Pain in legs when laying flat   '[]'$ Claudication   '[]'$ Pain in feet when walking  '[]'$ Pain in feet at rest  '[]'$ Pain in feet when laying flat   '[]'$ History of DVT   '[]'$ Phlebitis   '[]'$ Swelling in legs   '[]'$ Varicose veins   '[]'$ Non-healing ulcers Pulmonary:   '[]'$ Uses home oxygen   '[]'$ Productive cough   '[]'$ Hemoptysis   '[]'$ Wheeze  '[]'$ COPD   '[x]'$ Asthma Neurologic:  '[x]'$ Dizziness  '[]'$ Blackouts   '[]'$ Seizures   '[]'$ History of stroke   '[]'$ History of TIA  '[]'$ Aphasia   '[]'$ Temporary blindness   '[]'$ Dysphagia   '[]'$ Weakness or numbness in arms   '[]'$ Weakness or numbness in legs Musculoskeletal:  '[x]'$ Arthritis   '[]'$ Joint swelling   '[]'$ Joint pain   '[]'$ Low back pain Hematologic:  '[]'$ Easy bruising  '[]'$ Easy bleeding   '[]'$ Hypercoagulable state   '[x]'$ Anemic  '[]'$ Hepatitis Gastrointestinal:  '[]'$ Blood in stool   '[]'$ Vomiting blood  '[x]'$ Gastroesophageal reflux/heartburn   '[]'$ Difficulty swallowing. Genitourinary:  '[]'$ Chronic kidney disease   '[]'$ Difficult urination  '[]'$ Frequent urination  '[]'$ Burning with urination   '[]'$ Blood in urine Skin:  '[]'$ Rashes   '[]'$ Ulcers   '[]'$ Wounds Psychological:  '[x]'$ History of anxiety   '[]'$  History of major depression.  Physical Examination  BP 112/70   Pulse (!) 118   Resp 18   Ht '5\' 6"'$  (1.676 m)   Wt 163 lb (73.9 kg)   BMI 26.31 kg/m  Gen:  WD/WN, NAD.  Appears younger than stated age Head: [place]/AT, No temporalis wasting. Ear/Nose/Throat: Hearing grossly intact, nares w/o erythema or drainage Eyes: Conjunctiva clear. Sclera non-icteric Neck: Supple.  Trachea midline.  No carotid bruit.  Well-healed right carotid endarterectomy scar Pulmonary:  Good air movement, no use of accessory muscles.  Cardiac: Irregular  and tachycardic Vascular:  Vessel Right Left  Radial Palpable Palpable                          PT Palpable Palpable  DP Palpable Palpable   Gastrointestinal: soft, non-tender/non-distended. No guarding/reflex.  Musculoskeletal: M/S 5/5 throughout.  No deformity or atrophy.  No significant lower extremity edema. Neurologic: Sensation grossly intact in extremities.  Symmetrical.  Speech is fluent.  Psychiatric: Judgment intact, Mood & affect appropriate for pt's clinical situation. Dermatologic: No rashes or ulcers noted.  No cellulitis or open wounds.      Labs No results found for this or any previous visit (from the past 2160 hour(s)).  Radiology No results found.  Assessment/Plan  Diabetic polyneuropathy associated with type 2 diabetes mellitus (HCC) blood glucose control important in reducing the progression of atherosclerotic disease. Also, involved in wound healing. On appropriate medications.   HLD (hyperlipidemia) lipid control important in reducing the progression of atherosclerotic disease. Continue statin therapy   Carotid stenosis Duplex today shows a widely patent right carotid endarterectomy and velocities in the 1 to 39% range on the left.  She is doing well.  Continue current medical regimen.  Recheck in 6 months.    Leotis Pain, MD  04/13/2022 3:19 PM    This note was created with Dragon medical transcription system.  Any errors from dictation are purely unintentional

## 2022-04-13 NOTE — Assessment & Plan Note (Signed)
blood glucose control important in reducing the progression of atherosclerotic disease. Also, involved in wound healing. On appropriate medications.  

## 2022-04-22 ENCOUNTER — Other Ambulatory Visit (INDEPENDENT_AMBULATORY_CARE_PROVIDER_SITE_OTHER): Payer: Self-pay | Admitting: Nurse Practitioner

## 2022-04-28 ENCOUNTER — Encounter: Payer: Self-pay | Admitting: Oncology

## 2022-05-05 ENCOUNTER — Ambulatory Visit: Payer: Medicare PPO | Admitting: Urology

## 2022-06-02 ENCOUNTER — Other Ambulatory Visit (INDEPENDENT_AMBULATORY_CARE_PROVIDER_SITE_OTHER): Payer: Self-pay | Admitting: Nurse Practitioner

## 2022-06-21 ENCOUNTER — Other Ambulatory Visit (HOSPITAL_BASED_OUTPATIENT_CLINIC_OR_DEPARTMENT_OTHER): Payer: Self-pay | Admitting: Osteopathic Medicine

## 2022-09-29 ENCOUNTER — Ambulatory Visit: Payer: Medicare PPO | Admitting: Urology

## 2022-09-29 ENCOUNTER — Encounter: Payer: Self-pay | Admitting: Urology

## 2022-09-29 VITALS — BP 179/72 | HR 60 | Ht 66.0 in | Wt 164.0 lb

## 2022-09-29 DIAGNOSIS — N3941 Urge incontinence: Secondary | ICD-10-CM

## 2022-09-29 DIAGNOSIS — R102 Pelvic and perineal pain: Secondary | ICD-10-CM | POA: Diagnosis not present

## 2022-09-29 DIAGNOSIS — R8281 Pyuria: Secondary | ICD-10-CM

## 2022-09-29 LAB — MICROSCOPIC EXAMINATION
Epithelial Cells (non renal): >10 /hpf — AB (ref 0–10)
WBC, UA: >30 /hpf — AB (ref 0–5)

## 2022-09-29 LAB — URINALYSIS, COMPLETE
Bilirubin, UA: NEGATIVE
Glucose, UA: NEGATIVE
Ketones, UA: NEGATIVE
Nitrite, UA: NEGATIVE
RBC, UA: NEGATIVE
Specific Gravity, UA: 1.02 (ref 1.005–1.030)
Urobilinogen, Ur: 0.2 mg/dL (ref 0.2–1.0)
pH, UA: 7 (ref 5.0–7.5)

## 2022-09-29 MED ORDER — GEMTESA 75 MG PO TABS: 75.0000 mg | ORAL_TABLET | Freq: Every day | ORAL | 0 refills | Status: DC

## 2022-09-29 NOTE — Progress Notes (Signed)
09/29/2022 10:41 AM   Mary [name] 1935/07/25 [id number]  Referring provider: Dion Body, MD [facility] [place],  [place] [address]  Chief Complaint  Patient presents with   Recurrent UTI    HPI: Mary [name] is a 88 y.o. female referred for evaluation of recurrent UTIs.  Long history of recurrent urinary tract infection dating back to the 1970s Had seen Larene Beach in our office 2016-2018 and was last seen June 2020 Over the last 3 months she has had significant worsening symptoms including dysuria, frequency, urgency with urge incontinence and nocturnal enuresis Urinalysis PCP August 2023 had significant pyuria at 47 with urine culture growing mixed flora.  Repeat UA 08/03/2022 showed 52 WBC/28 RBC with urine culture growing mixed flora Denies gross hematuria   PMH: Past Medical History:  Diagnosis Date   Acid reflux    Anemia    Arthritis    Asthma    Atherosclerosis of abdominal aorta (HCC)    B12 deficiency    Clostridium difficile infection    H/O   Coronary artery disease    DDD (degenerative disc disease), lumbar    Depression    Diabetes ([place])    type 2   Diabetic retinopathy ([place])    Dysrhythmia    Heart murmur    HLD (hyperlipidemia)    HTN (hypertension)    Hypotension    when get up too quickly   Hypothyroidism    Pneumonia    PONV (postoperative nausea and vomiting)    Pulmonary nodule    right middle lobe on CT scan   Pure hypercholesterolemia    RA (rheumatoid arthritis) ([place])    Sleep apnea    Spinal stenosis    Urinary urgency     Surgical History: Past Surgical History:  Procedure Laterality Date   ABDOMINAL HYSTERECTOMY     CATARACT EXTRACTION, BILATERAL     COLONOSCOPY N/A 07/29/2021   Procedure: COLONOSCOPY;  Surgeon: Toledo, Benay Pike, MD;  Location: [facility];  Service: Gastroenterology;  Laterality: N/A;   CYSTOSCOPY     ENDARTERECTOMY Right 12/02/2021   Procedure: ENDARTERECTOMY  CAROTID;  Surgeon: Algernon Huxley, MD;  Location: [facility];  Service: Vascular;  Laterality: Right;   ESOPHAGOGASTRODUODENOSCOPY N/A 07/29/2021   Procedure: ESOPHAGOGASTRODUODENOSCOPY (EGD);  Surgeon: Toledo, Benay Pike, MD;  Location: [facility];  Service: Gastroenterology;  Laterality: N/A;  DM   HIP ARTHROPLASTY Right 09/24/2015   Procedure: ARTHROPLASTY BIPOLAR HIP (HEMIARTHROPLASTY);  Surgeon: Corky Mull, MD;  Location: [facility];  Service: Orthopedics;  Laterality: Right;   HIP ARTHROPLASTY Left 09/29/2018   Procedure: ARTHROPLASTY BIPOLAR HIP (HEMIARTHROPLASTY) LEFT;  Surgeon: Corky Mull, MD;  Location: [facility];  Service: Orthopedics;  Laterality: Left;   KNEE ARTHROSCOPY W/ AUTOGENOUS CARTILAGE IMPLANTATION (ACI) PROCEDURE     TOTAL KNEE ARTHROPLASTY Left 12/01/2017   Procedure: TOTAL KNEE ARTHROPLASTY;  Surgeon: Corky Mull, MD;  Location: [facility];  Service: Orthopedics;  Laterality: Left;   TOTAL KNEE ARTHROPLASTY Right 2014   VARICOSE VEIN SURGERY Left    leg    Home Medications:  Allergies as of 09/29/2022       Reactions   Cephalexin Hives   Nitrofurantoin    Other reaction(s): Other (See Comments) Other Reaction: measles-like lesions   Sulfa Antibiotics Hives   Atorvastatin Other (See Comments)   Muscle spasms/ muscle pain        Medication List        Accurate  as of September 29, 2022 10:41 AM. If you have any questions, ask your nurse or doctor.          acetaminophen 500 MG tablet Commonly known as: TYLENOL Take 500 mg by mouth every 6 (six) hours as needed.   amLODipine 10 MG tablet Commonly known as: NORVASC Take 1 tablet (10 mg total) by mouth daily.   aspirin EC 81 MG tablet Take 1 tablet (81 mg total) by mouth daily. Swallow whole.   azelastine 0.05 % ophthalmic solution Commonly known as: OPTIVAR Place 1 drop into both eyes daily as needed (allergies).   busPIRone 10 MG tablet Commonly known as: BUSPAR Take 20 mg by mouth 2 (two)  times daily.   clopidogrel 75 MG tablet Commonly known as: PLAVIX Take 1 tablet (75 mg total) by mouth daily at 6 (six) AM.   ezetimibe 10 MG tablet Commonly known as: ZETIA Take 10 mg by mouth at bedtime.   furosemide 20 MG tablet Commonly known as: LASIX Take 1 tablet (20 mg total) by mouth daily.   hydrALAZINE 100 MG tablet Commonly known as: APRESOLINE Take 1 tablet (100 mg total) by mouth 3 (three) times daily.   hydrochlorothiazide 25 MG tablet Commonly known as: HYDRODIURIL Take 1 tablet (25 mg total) by mouth daily.   levothyroxine 75 MCG tablet Commonly known as: SYNTHROID Take 75 mcg by mouth daily.   metFORMIN 500 MG 24 hr tablet Commonly known as: GLUCOPHAGE-XR Take 500 mg by mouth daily.   omeprazole 40 MG capsule Commonly known as: PRILOSEC Take 40 mg by mouth daily.   oxyCODONE-acetaminophen 5-325 MG tablet Commonly known as: PERCOCET/ROXICET Take 1-2 tablets by mouth every 4 (four) hours as needed for moderate pain.   pantoprazole 20 MG tablet Commonly known as: PROTONIX Take by mouth.   PARoxetine 40 MG tablet Commonly known as: PAXIL Take 40 mg by mouth every evening.   rosuvastatin 5 MG tablet Commonly known as: CRESTOR Take 1 tablet (5 mg total) by mouth daily.   traZODone 50 MG tablet Commonly known as: DESYREL Take 1 tablet (50 mg total) by mouth at bedtime as needed for sleep. What changed: how much to take   valsartan 160 MG tablet Commonly known as: DIOVAN Take 160 mg by mouth daily.   vitamin B-12 500 MCG tablet Commonly known as: CYANOCOBALAMIN Take 500 mcg by mouth every other day.   Vitron-C 65-125 MG Tabs Generic drug: Iron-Vitamin C Take 1 tablet by mouth daily. What changed: when to take this        Allergies:  Allergies  Allergen Reactions   Cephalexin Hives   Nitrofurantoin     Other reaction(s): Other (See Comments) Other Reaction: measles-like lesions   Sulfa Antibiotics Hives   Atorvastatin Other (See  Comments)    Muscle spasms/ muscle pain    Family History: Family History  Problem Relation Age of Onset   Kidney disease Brother        also nephew   Heart disease Mother    Heart disease Father    Prostate cancer Neg Hx    Bladder Cancer Neg Hx    Breast cancer Neg Hx    Kidney cancer Neg Hx     Social History:  reports that she has never smoked. She has never used smokeless tobacco. She reports that she does not drink alcohol and does not use drugs.   Physical Exam: BP (!) 179/72   Pulse 60   Ht '5\' 6"'$  (1.676  m)   Wt 164 lb (74.4 kg)   BMI 26.47 kg/m   Constitutional:  Alert, No acute distress. HEENT: [place] AT Respiratory: Normal respiratory effort, no increased work of breathing. GI: Abdomen is soft, nontender, nondistended, no abdominal masses Psychiatric: Normal mood and affect.  Laboratory Data:  Urinalysis Dipstick 1+ protein/1+ leukocytes Microscopy >30 WBC/0-2 RBC/greater than 10 epis    Assessment & Plan:   88 y.o. female with storage related voiding symptoms, dysuria and pelvic discomfort.  Urine cultures x 2 with mixed flora.  She does not meet AUA criteria for recurrent UTI based on negative culture She does have pyuria.  Repeat urine culture today. Recommend further evaluation with RUS and cystoscopy Trial Gemtesa 75 mg daily pending urine culture results and further evaluation-samples given   Abbie Sons, MD  [facility] [address] [place], [place] [address] [phone]

## 2022-10-06 LAB — CULTURE, URINE COMPREHENSIVE

## 2022-10-07 ENCOUNTER — Telehealth: Payer: Self-pay | Admitting: *Deleted

## 2022-10-07 MED ORDER — DOXYCYCLINE HYCLATE 100 MG PO TABS: 100.0000 mg | ORAL_TABLET | Freq: Two times a day (BID) | ORAL | 0 refills | Status: DC

## 2022-10-07 NOTE — Telephone Encounter (Signed)
Left message per DPR to pick up medication at [place] [address]

## 2022-10-07 NOTE — Telephone Encounter (Signed)
-----   Message from Abbie Sons, MD sent at 10/07/2022  7:22 AM EST ----- Urine culture was positive.  Please send Rx doxycycline 100 mg twice daily x 7 days

## 2022-10-12 ENCOUNTER — Ambulatory Visit (INDEPENDENT_AMBULATORY_CARE_PROVIDER_SITE_OTHER): Payer: Medicare PPO | Admitting: Vascular Surgery

## 2022-10-12 ENCOUNTER — Ambulatory Visit (INDEPENDENT_AMBULATORY_CARE_PROVIDER_SITE_OTHER): Payer: Medicare PPO

## 2022-10-12 VITALS — BP 199/73 | HR 60 | Resp 15 | Wt 166.0 lb

## 2022-10-12 DIAGNOSIS — E1142 Type 2 diabetes mellitus with diabetic polyneuropathy: Secondary | ICD-10-CM

## 2022-10-12 DIAGNOSIS — E785 Hyperlipidemia, unspecified: Secondary | ICD-10-CM | POA: Diagnosis not present

## 2022-10-12 DIAGNOSIS — I10 Essential (primary) hypertension: Secondary | ICD-10-CM | POA: Diagnosis not present

## 2022-10-12 DIAGNOSIS — I6523 Occlusion and stenosis of bilateral carotid arteries: Secondary | ICD-10-CM | POA: Diagnosis not present

## 2022-10-12 DIAGNOSIS — I6521 Occlusion and stenosis of right carotid artery: Secondary | ICD-10-CM

## 2022-10-12 NOTE — Progress Notes (Signed)
MRN : [id number]  Mary [name] is a 88 y.o. (1934/11/05) female who presents with chief complaint of  Chief Complaint  Patient presents with   Follow-up    Ultrasound follow up  .  History of Present Illness: Patient returns today in follow up of her carotid disease.  She had a carotid endarterectomy performed last spring and has done well.  She denies any focal neurologic symptoms. Specifically, the patient denies amaurosis fugax, speech or swallowing difficulties, or arm or leg weakness or numbness.  Carotid duplex today reveals a patent right carotid endarterectomy and stable 40 to 59% left ICA stenosis.  Current Outpatient Medications  Medication Sig Dispense Refill   acetaminophen (TYLENOL) 500 MG tablet Take 500 mg by mouth every 6 (six) hours as needed.     amLODipine (NORVASC) 10 MG tablet Take 1 tablet (10 mg total) by mouth daily. 30 tablet 0   aspirin EC 81 MG EC tablet Take 1 tablet (81 mg total) by mouth daily. Swallow whole. 30 tablet 11   azelastine (OPTIVAR) 0.05 % ophthalmic solution Place 1 drop into both eyes daily as needed (allergies).     busPIRone (BUSPAR) 10 MG tablet Take 20 mg by mouth 2 (two) times daily.      clopidogrel (PLAVIX) 75 MG tablet Take 1 tablet (75 mg total) by mouth daily at 6 (six) AM. 30 tablet 6   doxycycline (VIBRA-TABS) 100 MG tablet Take 1 tablet (100 mg total) by mouth 2 (two) times daily. 14 tablet 0   ezetimibe (ZETIA) 10 MG tablet Take 10 mg by mouth at bedtime.     furosemide (LASIX) 20 MG tablet Take 1 tablet (20 mg total) by mouth daily. 30 tablet 0   hydrALAZINE (APRESOLINE) 100 MG tablet Take 1 tablet (100 mg total) by mouth 3 (three) times daily. 90 tablet 0   hydrochlorothiazide (HYDRODIURIL) 25 MG tablet Take 1 tablet (25 mg total) by mouth daily. 30 tablet 0   Iron-Vitamin C (VITRON-C) 65-125 MG TABS Take 1 tablet by mouth daily. (Patient taking differently: Take 1 tablet by mouth every other day.) 90 tablet 1   levothyroxine  (SYNTHROID, LEVOTHROID) 75 MCG tablet Take 75 mcg by mouth daily.      metFORMIN (GLUCOPHAGE-XR) 500 MG 24 hr tablet Take 500 mg by mouth daily.     omeprazole (PRILOSEC) 40 MG capsule Take 40 mg by mouth daily.      oxyCODONE-acetaminophen (PERCOCET/ROXICET) 5-325 MG tablet Take 1-2 tablets by mouth every 4 (four) hours as needed for moderate pain. 30 tablet 0   pantoprazole (PROTONIX) 20 MG tablet Take by mouth.     PARoxetine (PAXIL) 40 MG tablet Take 40 mg by mouth every evening.     rosuvastatin (CRESTOR) 5 MG tablet Take 1 tablet (5 mg total) by mouth daily. 30 tablet 0   traZODone (DESYREL) 50 MG tablet Take 1 tablet (50 mg total) by mouth at bedtime as needed for sleep. (Patient taking differently: Take 25 mg by mouth at bedtime as needed for sleep.) 30 tablet 0   valsartan (DIOVAN) 160 MG tablet Take 160 mg by mouth daily.     Vibegron (GEMTESA) 75 MG TABS Take 1 tablet (75 mg total) by mouth daily. 30 tablet 0   vitamin B-12 (CYANOCOBALAMIN) 500 MCG tablet Take 500 mcg by mouth every other day.     No current facility-administered medications for this visit.    Past Medical History:  Diagnosis Date   Acid  reflux    Anemia    Arthritis    Asthma    Atherosclerosis of abdominal aorta (HCC)    B12 deficiency    Clostridium difficile infection    H/O   Coronary artery disease    DDD (degenerative disc disease), lumbar    Depression    Diabetes ([place])    type 2   Diabetic retinopathy ([place])    Dysrhythmia    Heart murmur    HLD (hyperlipidemia)    HTN (hypertension)    Hypotension    when get up too quickly   Hypothyroidism    Pneumonia    PONV (postoperative nausea and vomiting)    Pulmonary nodule    right middle lobe on CT scan   Pure hypercholesterolemia    RA (rheumatoid arthritis) ([place])    Sleep apnea    Spinal stenosis    Urinary urgency     Past Surgical History:  Procedure Laterality Date   ABDOMINAL HYSTERECTOMY     CATARACT EXTRACTION, BILATERAL      COLONOSCOPY N/A 07/29/2021   Procedure: COLONOSCOPY;  Surgeon: Toledo, Benay Pike, MD;  Location: [facility];  Service: Gastroenterology;  Laterality: N/A;   CYSTOSCOPY     ENDARTERECTOMY Right 12/02/2021   Procedure: ENDARTERECTOMY CAROTID;  Surgeon: Algernon Huxley, MD;  Location: [facility];  Service: Vascular;  Laterality: Right;   ESOPHAGOGASTRODUODENOSCOPY N/A 07/29/2021   Procedure: ESOPHAGOGASTRODUODENOSCOPY (EGD);  Surgeon: Toledo, Benay Pike, MD;  Location: [facility];  Service: Gastroenterology;  Laterality: N/A;  DM   HIP ARTHROPLASTY Right 09/24/2015   Procedure: ARTHROPLASTY BIPOLAR HIP (HEMIARTHROPLASTY);  Surgeon: Corky Mull, MD;  Location: [facility];  Service: Orthopedics;  Laterality: Right;   HIP ARTHROPLASTY Left 09/29/2018   Procedure: ARTHROPLASTY BIPOLAR HIP (HEMIARTHROPLASTY) LEFT;  Surgeon: Corky Mull, MD;  Location: [facility];  Service: Orthopedics;  Laterality: Left;   KNEE ARTHROSCOPY W/ AUTOGENOUS CARTILAGE IMPLANTATION (ACI) PROCEDURE     TOTAL KNEE ARTHROPLASTY Left 12/01/2017   Procedure: TOTAL KNEE ARTHROPLASTY;  Surgeon: Corky Mull, MD;  Location: [facility];  Service: Orthopedics;  Laterality: Left;   TOTAL KNEE ARTHROPLASTY Right 2014   VARICOSE VEIN SURGERY Left    leg     Social History   Tobacco Use   Smoking status: Never   Smokeless tobacco: Never  Vaping Use   Vaping Use: Never used  Substance Use Topics   Alcohol use: No    Alcohol/week: 0.0 standard drinks of alcohol   Drug use: No      Family History  Problem Relation Age of Onset   Kidney disease Brother        also nephew   Heart disease Mother    Heart disease Father    Prostate cancer Neg Hx    Bladder Cancer Neg Hx    Breast cancer Neg Hx    Kidney cancer Neg Hx      Allergies  Allergen Reactions   Cephalexin Hives   Nitrofurantoin     Other reaction(s): Other (See Comments) Other Reaction: measles-like lesions   Sulfa Antibiotics Hives   Atorvastatin Other  (See Comments)    Muscle spasms/ muscle pain     REVIEW OF SYSTEMS (Negative unless checked)   Constitutional: []$ Weight loss  []$ Fever  []$ Chills Cardiac: []$ Chest pain   []$ Chest pressure   []$ Palpitations   []$ Shortness of breath when laying flat   []$ Shortness of breath at rest   [x]$ Shortness of breath with exertion. Vascular:  []$ Pain  in legs with walking   []$ Pain in legs at rest   []$ Pain in legs when laying flat   []$ Claudication   []$ Pain in feet when walking  []$ Pain in feet at rest  []$ Pain in feet when laying flat   []$ History of DVT   []$ Phlebitis   []$ Swelling in legs   []$ Varicose veins   []$ Non-healing ulcers Pulmonary:   []$ Uses home oxygen   []$ Productive cough   []$ Hemoptysis   []$ Wheeze  []$ COPD   [x]$ Asthma Neurologic:  [x]$ Dizziness  []$ Blackouts   []$ Seizures   []$ History of stroke   []$ History of TIA  []$ Aphasia   []$ Temporary blindness   []$ Dysphagia   []$ Weakness or numbness in arms   []$ Weakness or numbness in legs Musculoskeletal:  [x]$ Arthritis   []$ Joint swelling   []$ Joint pain   []$ Low back pain Hematologic:  []$ Easy bruising  []$ Easy bleeding   []$ Hypercoagulable state   [x]$ Anemic  []$ Hepatitis Gastrointestinal:  []$ Blood in stool   []$ Vomiting blood  [x]$ Gastroesophageal reflux/heartburn   []$ Difficulty swallowing. Genitourinary:  []$ Chronic kidney disease   []$ Difficult urination  []$ Frequent urination  []$ Burning with urination   []$ Blood in urine Skin:  []$ Rashes   []$ Ulcers   []$ Wounds Psychological:  [x]$ History of anxiety   []$  History of major depression.   Physical Examination  BP (!) 199/73 (BP Location: Left Arm)   Pulse 60   Resp 15   Wt 166 lb (75.3 kg)   BMI 26.79 kg/m  Gen:  WD/WN, NAD. Appears younger than stated age. Head: [place]/AT, No temporalis wasting. Ear/Nose/Throat: Hearing grossly intact, nares w/o erythema or drainage Eyes: Conjunctiva clear. Sclera non-icteric Neck: Supple.  Trachea midline. No bruit.  Pulmonary:  Good air movement, no use of accessory muscles.  Cardiac: RRR, no  JVD Vascular:  Vessel Right Left  Radial Palpable Palpable                   Musculoskeletal: M/S 5/5 throughout.  No deformity or atrophy. No edema. Neurologic: Sensation grossly intact in extremities.  Symmetrical.  Speech is fluent.  Psychiatric: Judgment intact, Mood & affect appropriate for pt's clinical situation. Dermatologic: No rashes or ulcers noted.  No cellulitis or open wounds.      Labs Recent Results (from the past 2160 hour(s))  Urinalysis, Complete     Status: Abnormal   Collection Time: 09/29/22 10:12 AM  Result Value Ref Range   Specific Gravity, UA 1.020 1.005 - 1.030   pH, UA 7.0 5.0 - 7.5   Color, UA Yellow Yellow   Appearance Ur Hazy (A) Clear   Leukocytes,UA 1+ (A) Negative   Protein,UA 1+ (A) Negative/Trace   Glucose, UA Negative Negative   Ketones, UA Negative Negative   RBC, UA Negative Negative   Bilirubin, UA Negative Negative   Urobilinogen, Ur 0.2 0.2 - 1.0 mg/dL   Nitrite, UA Negative Negative   Microscopic Examination See below:   Microscopic Examination     Status: Abnormal   Collection Time: 09/29/22 10:12 AM   Urine  Result Value Ref Range   WBC, UA >30 (A) 0 - 5 /hpf   RBC, Urine 0-2 0 - 2 /hpf   Epithelial Cells (non renal) >10 (A) 0 - 10 /hpf   Casts Present (A) None seen /lpf   Cast Type Granular casts (A) N/A   Bacteria, UA Moderate (A) None seen/Few  CULTURE, URINE COMPREHENSIVE     Status: Abnormal   Collection Time: 09/29/22 10:49 AM   Specimen: Urine   UR  Result Value Ref Range   Urine Culture, Comprehensive Final report (A)    Organism ID, Bacteria Klebsiella pneumoniae (A)     Comment: Cefazolin <=4 ug/mL Cefazolin with an MIC <=16 predicts susceptibility to the oral agents cefaclor, cefdinir, cefpodoxime, cefprozil, cefuroxime, cephalexin, and loracarbef when used for therapy of uncomplicated urinary tract infections due to E. coli, Klebsiella pneumoniae, and Proteus mirabilis. 25,000-50,000 colony forming  units per mL    ANTIMICROBIAL SUSCEPTIBILITY Comment     Comment:       ** S = Susceptible; I = Intermediate; R = Resistant **                    P = Positive; N = Negative             MICS are expressed in micrograms per mL    Antibiotic                 RSLT#1    RSLT#2    RSLT#3    RSLT#4 Amoxicillin/Clavulanic Acid    S Ampicillin                     R Cefepime                       S Ceftriaxone                    S Cefuroxime                     S Ciprofloxacin                  S Ertapenem                      S Gentamicin                     S Imipenem                       S Levofloxacin                   S Meropenem                      S Nitrofurantoin                 R Piperacillin/Tazobactam        S Tetracycline                   S Tobramycin                     S Trimethoprim/Sulfa             S     Radiology No results found.  Assessment/Plan Diabetic polyneuropathy associated with type 2 diabetes mellitus (HCC) blood glucose control important in reducing the progression of atherosclerotic disease. Also, involved in wound healing. On appropriate medications.     HLD (hyperlipidemia) lipid control important in reducing the progression of atherosclerotic disease. Continue statin therapy  Carotid stenosis Carotid duplex today reveals a patent right carotid endarterectomy and stable 40 to 59% left ICA stenosis.  Continue current medical regimen including antiplatelet and statin agents.  Recheck in 6 months with duplex.    Leotis Pain, MD  10/12/2022 2:10 PM    This note was created with [employer]  medical transcription system.  Any errors from dictation are purely unintentional

## 2022-10-12 NOTE — Assessment & Plan Note (Signed)
Carotid duplex today reveals a patent right carotid endarterectomy and stable 40 to 59% left ICA stenosis.  Continue current medical regimen including antiplatelet and statin agents.  Recheck in 6 months with duplex.

## 2022-10-14 ENCOUNTER — Ambulatory Visit: Admission: RE | Admit: 2022-10-14 | Payer: Medicare PPO | Source: Ambulatory Visit

## 2022-10-20 ENCOUNTER — Ambulatory Visit
Admission: RE | Admit: 2022-10-20 | Discharge: 2022-10-20 | Disposition: A | Discharge status: Home / Self Care | Payer: Medicare PPO | Source: Ambulatory Visit | Attending: Urology | Admitting: Urology

## 2022-10-20 DIAGNOSIS — R93421 Abnormal radiologic findings on diagnostic imaging of right kidney: Secondary | ICD-10-CM | POA: Insufficient documentation

## 2022-10-20 DIAGNOSIS — N281 Cyst of kidney, acquired: Secondary | ICD-10-CM | POA: Insufficient documentation

## 2022-10-20 DIAGNOSIS — R93422 Abnormal radiologic findings on diagnostic imaging of left kidney: Secondary | ICD-10-CM | POA: Diagnosis not present

## 2022-10-20 DIAGNOSIS — R8281 Pyuria: Secondary | ICD-10-CM | POA: Diagnosis present

## 2022-11-02 ENCOUNTER — Telehealth (INDEPENDENT_AMBULATORY_CARE_PROVIDER_SITE_OTHER): Payer: Self-pay

## 2022-11-02 NOTE — Telephone Encounter (Signed)
Amy called for Dr. Netty Starring about Mary [name] have "accelerating hypertension"  Dr. Netty Starring looked in her charts for any ultrasounds about her renal arteries but didn't see anything stating that they was clear, but didn't see anything. Amy wants someone to call her direct line at [phone].

## 2022-11-02 NOTE — Telephone Encounter (Signed)
She needs a renal artery duplex, I have looked through her ultrasounds and it doesn't look like we have ever evaluated her renal arteries.  We can reach out to contact the patient to have her come in to see [place] for evaluation.

## 2022-11-03 NOTE — Telephone Encounter (Signed)
Mary [name] is calling her this morning to make an appt to get it done.

## 2022-11-04 ENCOUNTER — Ambulatory Visit (INDEPENDENT_AMBULATORY_CARE_PROVIDER_SITE_OTHER): Payer: Medicare PPO | Admitting: Urology

## 2022-11-04 ENCOUNTER — Ambulatory Visit (INDEPENDENT_AMBULATORY_CARE_PROVIDER_SITE_OTHER): Payer: Medicare PPO

## 2022-11-04 ENCOUNTER — Other Ambulatory Visit (INDEPENDENT_AMBULATORY_CARE_PROVIDER_SITE_OTHER): Payer: Self-pay | Admitting: Nurse Practitioner

## 2022-11-04 ENCOUNTER — Encounter: Payer: Self-pay | Admitting: Urology

## 2022-11-04 VITALS — BP 198/67 | HR 74 | Ht 65.0 in | Wt 166.0 lb

## 2022-11-04 DIAGNOSIS — R39198 Other difficulties with micturition: Secondary | ICD-10-CM

## 2022-11-04 DIAGNOSIS — N3941 Urge incontinence: Secondary | ICD-10-CM

## 2022-11-04 DIAGNOSIS — R82998 Other abnormal findings in urine: Secondary | ICD-10-CM | POA: Diagnosis not present

## 2022-11-04 DIAGNOSIS — R300 Dysuria: Secondary | ICD-10-CM | POA: Diagnosis not present

## 2022-11-04 DIAGNOSIS — I10 Essential (primary) hypertension: Secondary | ICD-10-CM | POA: Diagnosis not present

## 2022-11-04 DIAGNOSIS — R102 Pelvic and perineal pain: Secondary | ICD-10-CM

## 2022-11-04 DIAGNOSIS — R8281 Pyuria: Secondary | ICD-10-CM

## 2022-11-04 LAB — URINALYSIS, COMPLETE
Bilirubin, UA: NEGATIVE
Glucose, UA: NEGATIVE
Nitrite, UA: NEGATIVE
RBC, UA: NEGATIVE
Specific Gravity, UA: 1.025 (ref 1.005–1.030)
Urobilinogen, Ur: 1 mg/dL (ref 0.2–1.0)
pH, UA: 5.5 (ref 5.0–7.5)

## 2022-11-04 LAB — MICROSCOPIC EXAMINATION: Epithelial Cells (non renal): >10 /hpf — AB (ref 0–10)

## 2022-11-04 MED ORDER — GEMTESA 75 MG PO TABS: 75.0000 mg | ORAL_TABLET | Freq: Every day | ORAL | 11 refills | Status: DC

## 2022-11-04 NOTE — Progress Notes (Signed)
   11/04/22  CC:  Chief Complaint  Patient presents with   Cysto    HPI: Refer to prior office note 09/29/2022.  RUS showed increased cortical echogenicity consistent with medical renal disease.  Marked improvement and LUTS on Gemtesa.   Blood pressure (!) 198/67, pulse 74, height 5\' 5"  (1.651 m), weight 166 lb (75.3 kg). NED. A&Ox3.   No respiratory distress   Abd soft, NT, ND Normal external genitalia with patent urethral meatus  Cystoscopy Procedure Note  Patient identification was confirmed, informed consent was obtained, and patient was prepped using Betadine solution.  Lidocaine jelly was administered per urethral meatus.    Procedure: - Flexible cystoscope introduced, without any difficulty.   - Thorough search of the bladder revealed:    normal urethral meatus    normal urothelium    no stones    no ulcers     no tumors    no urethral polyps    no trabeculation  - Ureteral orifices were normal in position and appearance.  Post-Procedure: - Patient tolerated the procedure well  Assessment/ Plan: No mucosal abnormalities on cystoscopy Marked improvement on Gemtesa.  Rx sent and if not covered by insurance or cost prohibitive will look at alternative medications Follow-up 6 months or earlier for worsening symptoms    Abbie Sons, MD

## 2022-11-12 ENCOUNTER — Encounter (INDEPENDENT_AMBULATORY_CARE_PROVIDER_SITE_OTHER): Payer: Self-pay | Admitting: Vascular Surgery

## 2022-11-12 ENCOUNTER — Ambulatory Visit (INDEPENDENT_AMBULATORY_CARE_PROVIDER_SITE_OTHER): Payer: Medicare PPO | Admitting: Vascular Surgery

## 2022-11-12 VITALS — BP 174/68 | HR 57 | Resp 18 | Ht 65.0 in | Wt 164.0 lb

## 2022-11-12 DIAGNOSIS — I701 Atherosclerosis of renal artery: Secondary | ICD-10-CM

## 2022-11-12 DIAGNOSIS — I150 Renovascular hypertension: Secondary | ICD-10-CM | POA: Diagnosis not present

## 2022-11-12 DIAGNOSIS — E1142 Type 2 diabetes mellitus with diabetic polyneuropathy: Secondary | ICD-10-CM

## 2022-11-12 DIAGNOSIS — I6521 Occlusion and stenosis of right carotid artery: Secondary | ICD-10-CM

## 2022-11-12 DIAGNOSIS — E785 Hyperlipidemia, unspecified: Secondary | ICD-10-CM

## 2022-11-12 NOTE — Assessment & Plan Note (Signed)
Although by duplex criteria, there is not a clear high-grade stenosis, there does appear to be some elevated velocities and some degree of stenosis in both renal arteries.  At this point, with my clinical suspicion, I would recommend a renal angiogram with possible invention for further evaluation for significant renal artery stenosis.  If significant renal artery stenosis is identified, renal artery stenting would be planned at that time.  Risks and benefits were discussed with the patient and she is agreeable to proceed.

## 2022-11-12 NOTE — Progress Notes (Signed)
MRN : [id number]  Mary [name] is a 88 y.o. (1935-03-20) female who presents with chief complaint of  Chief Complaint  Patient presents with   Follow-up    Follow up Renal US  .  History of Present Illness: Patient returns today in follow up of her worsening hypertension and recent renal artery duplex.  Her blood pressure control has been suboptimal and her blood pressure today at 174/68 is not atypical for her.  Her primary care provider has manipulated her medical regimen.  She recently underwent a renal artery duplex.  Although by duplex criteria, there is not a clear high-grade stenosis, there does appear to be some elevated velocities and some degree of stenosis in both renal arteries.  Current Outpatient Medications  Medication Sig Dispense Refill   acetaminophen (TYLENOL) 500 MG tablet Take 500 mg by mouth every 6 (six) hours as needed.     amLODipine (NORVASC) 10 MG tablet Take 1 tablet (10 mg total) by mouth daily. 30 tablet 0   aspirin EC 81 MG EC tablet Take 1 tablet (81 mg total) by mouth daily. Swallow whole. 30 tablet 11   azelastine (OPTIVAR) 0.05 % ophthalmic solution Place 1 drop into both eyes daily as needed (allergies).     busPIRone (BUSPAR) 10 MG tablet Take 20 mg by mouth 2 (two) times daily.      clopidogrel (PLAVIX) 75 MG tablet Take 1 tablet (75 mg total) by mouth daily at 6 (six) AM. 30 tablet 6   doxycycline (VIBRA-TABS) 100 MG tablet Take 1 tablet (100 mg total) by mouth 2 (two) times daily. 14 tablet 0   ezetimibe (ZETIA) 10 MG tablet Take 10 mg by mouth at bedtime.     furosemide (LASIX) 20 MG tablet Take 1 tablet (20 mg total) by mouth daily. 30 tablet 0   hydrALAZINE (APRESOLINE) 100 MG tablet Take 1 tablet (100 mg total) by mouth 3 (three) times daily. 90 tablet 0   hydrochlorothiazide (HYDRODIURIL) 25 MG tablet Take 1 tablet (25 mg total) by mouth daily. 30 tablet 0   Iron-Vitamin C (VITRON-C) 65-125 MG TABS Take 1 tablet by mouth daily. (Patient taking  differently: Take 1 tablet by mouth every other day.) 90 tablet 1   levothyroxine (SYNTHROID, LEVOTHROID) 75 MCG tablet Take 75 mcg by mouth daily.      metFORMIN (GLUCOPHAGE-XR) 500 MG 24 hr tablet Take 500 mg by mouth daily.     omeprazole (PRILOSEC) 40 MG capsule Take 40 mg by mouth daily.      oxyCODONE-acetaminophen (PERCOCET/ROXICET) 5-325 MG tablet Take 1-2 tablets by mouth every 4 (four) hours as needed for moderate pain. 30 tablet 0   pantoprazole (PROTONIX) 20 MG tablet Take by mouth.     PARoxetine (PAXIL) 40 MG tablet Take 40 mg by mouth every evening.     rosuvastatin (CRESTOR) 5 MG tablet Take 1 tablet (5 mg total) by mouth daily. 30 tablet 0   traZODone (DESYREL) 50 MG tablet Take 1 tablet (50 mg total) by mouth at bedtime as needed for sleep. (Patient taking differently: Take 25 mg by mouth at bedtime as needed for sleep.) 30 tablet 0   valsartan (DIOVAN) 160 MG tablet Take 160 mg by mouth daily.     Vibegron (GEMTESA) 75 MG TABS Take 1 tablet (75 mg total) by mouth daily. 30 tablet 0   Vibegron (GEMTESA) 75 MG TABS Take 1 tablet (75 mg total) by mouth daily. 30 tablet 11  vitamin B-12 (CYANOCOBALAMIN) 500 MCG tablet Take 500 mcg by mouth every other day.     No current facility-administered medications for this visit.    Past Medical History:  Diagnosis Date   Acid reflux    Anemia    Arthritis    Asthma    Atherosclerosis of abdominal aorta (HCC)    B12 deficiency    Clostridium difficile infection    H/O   Coronary artery disease    DDD (degenerative disc disease), lumbar    Depression    Diabetes ([place])    type 2   Diabetic retinopathy ([place])    Dysrhythmia    Heart murmur    HLD (hyperlipidemia)    HTN (hypertension)    Hypotension    when get up too quickly   Hypothyroidism    Pneumonia    PONV (postoperative nausea and vomiting)    Pulmonary nodule    right middle lobe on CT scan   Pure hypercholesterolemia    RA (rheumatoid arthritis) ([place])     Sleep apnea    Spinal stenosis    Urinary urgency     Past Surgical History:  Procedure Laterality Date   ABDOMINAL HYSTERECTOMY     CATARACT EXTRACTION, BILATERAL     COLONOSCOPY N/A 07/29/2021   Procedure: COLONOSCOPY;  Surgeon: Toledo, Benay Pike, MD;  Location: [facility];  Service: Gastroenterology;  Laterality: N/A;   CYSTOSCOPY     ENDARTERECTOMY Right 12/02/2021   Procedure: ENDARTERECTOMY CAROTID;  Surgeon: Algernon Huxley, MD;  Location: [facility];  Service: Vascular;  Laterality: Right;   ESOPHAGOGASTRODUODENOSCOPY N/A 07/29/2021   Procedure: ESOPHAGOGASTRODUODENOSCOPY (EGD);  Surgeon: Toledo, Benay Pike, MD;  Location: [facility];  Service: Gastroenterology;  Laterality: N/A;  DM   HIP ARTHROPLASTY Right 09/24/2015   Procedure: ARTHROPLASTY BIPOLAR HIP (HEMIARTHROPLASTY);  Surgeon: Corky Mull, MD;  Location: [facility];  Service: Orthopedics;  Laterality: Right;   HIP ARTHROPLASTY Left 09/29/2018   Procedure: ARTHROPLASTY BIPOLAR HIP (HEMIARTHROPLASTY) LEFT;  Surgeon: Corky Mull, MD;  Location: [facility];  Service: Orthopedics;  Laterality: Left;   KNEE ARTHROSCOPY W/ AUTOGENOUS CARTILAGE IMPLANTATION (ACI) PROCEDURE     TOTAL KNEE ARTHROPLASTY Left 12/01/2017   Procedure: TOTAL KNEE ARTHROPLASTY;  Surgeon: Corky Mull, MD;  Location: [facility];  Service: Orthopedics;  Laterality: Left;   TOTAL KNEE ARTHROPLASTY Right 2014   VARICOSE VEIN SURGERY Left    leg     Social History   Tobacco Use   Smoking status: Never   Smokeless tobacco: Never  Vaping Use   Vaping Use: Never used  Substance Use Topics   Alcohol use: No    Alcohol/week: 0.0 standard drinks of alcohol   Drug use: No      Family History  Problem Relation Age of Onset   Kidney disease Brother        also nephew   Heart disease Mother    Heart disease Father    Prostate cancer Neg Hx    Bladder Cancer Neg Hx    Breast cancer Neg Hx    Kidney cancer Neg Hx      Allergies  Allergen  Reactions   Cephalexin Hives   Nitrofurantoin     Other reaction(s): Other (See Comments) Other Reaction: measles-like lesions   Sulfa Antibiotics Hives   Atorvastatin Other (See Comments)    Muscle spasms/ muscle pain     REVIEW OF SYSTEMS (Negative unless checked)   Constitutional: [] Weight loss  [] Fever  []   Chills Cardiac: [] Chest pain   [] Chest pressure   [] Palpitations   [] Shortness of breath when laying flat   [] Shortness of breath at rest   [x] Shortness of breath with exertion. Vascular:  [] Pain in legs with walking   [] Pain in legs at rest   [] Pain in legs when laying flat   [] Claudication   [] Pain in feet when walking  [] Pain in feet at rest  [] Pain in feet when laying flat   [] History of DVT   [] Phlebitis   [] Swelling in legs   [] Varicose veins   [] Non-healing ulcers Pulmonary:   [] Uses home oxygen   [] Productive cough   [] Hemoptysis   [] Wheeze  [] COPD   [x] Asthma Neurologic:  [x] Dizziness  [] Blackouts   [] Seizures   [] History of stroke   [] History of TIA  [] Aphasia   [] Temporary blindness   [] Dysphagia   [] Weakness or numbness in arms   [] Weakness or numbness in legs Musculoskeletal:  [x] Arthritis   [] Joint swelling   [] Joint pain   [] Low back pain Hematologic:  [] Easy bruising  [] Easy bleeding   [] Hypercoagulable state   [x] Anemic  [] Hepatitis Gastrointestinal:  [] Blood in stool   [] Vomiting blood  [x] Gastroesophageal reflux/heartburn   [] Difficulty swallowing. Genitourinary:  [] Chronic kidney disease   [] Difficult urination  [] Frequent urination  [] Burning with urination   [] Blood in urine Skin:  [] Rashes   [] Ulcers   [] Wounds Psychological:  [x] History of anxiety   []  History of major depression.  Physical Examination  BP (!) 174/68 (BP Location: Left Arm)   Pulse (!) 57   Resp 18   Ht 5\' 5"  (1.651 m)   Wt 164 lb (74.4 kg)   BMI 27.29 kg/m  Gen:  WD/WN, NAD. Appears younger than stated age. Head: [place]/AT, No temporalis wasting. Ear/Nose/Throat: Hearing grossly intact,  nares w/o erythema or drainage Eyes: Conjunctiva clear. Sclera non-icteric Neck: Supple.  Trachea midline Pulmonary:  Good air movement, no use of accessory muscles.  Cardiac: RRR, no JVD Vascular:  Vessel Right Left  Radial Palpable Palpable               Musculoskeletal: M/S 5/5 throughout.  No deformity or atrophy. No edema. Neurologic: Sensation grossly intact in extremities.  Symmetrical.  Speech is fluent.  Psychiatric: Judgment intact, Mood & affect appropriate for pt's clinical situation. Dermatologic: No rashes or ulcers noted.  No cellulitis or open wounds.      Labs Recent Results (from the past 2160 hour(s))  Urinalysis, Complete     Status: Abnormal   Collection Time: 09/29/22 10:12 AM  Result Value Ref Range   Specific Gravity, UA 1.020 1.005 - 1.030   pH, UA 7.0 5.0 - 7.5   Color, UA Yellow Yellow   Appearance Ur Hazy (A) Clear   Leukocytes,UA 1+ (A) Negative   Protein,UA 1+ (A) Negative/Trace   Glucose, UA Negative Negative   Ketones, UA Negative Negative   RBC, UA Negative Negative   Bilirubin, UA Negative Negative   Urobilinogen, Ur 0.2 0.2 - 1.0 mg/dL   Nitrite, UA Negative Negative   Microscopic Examination See below:   Microscopic Examination     Status: Abnormal   Collection Time: 09/29/22 10:12 AM   Urine  Result Value Ref Range   WBC, UA >30 (A) 0 - 5 /hpf   RBC, Urine 0-2 0 - 2 /hpf   Epithelial Cells (non renal) >10 (A) 0 - 10 /hpf   Casts Present (A) None seen /lpf   Cast Type Granular  casts (A) N/A   Bacteria, UA Moderate (A) None seen/Few  CULTURE, URINE COMPREHENSIVE     Status: Abnormal   Collection Time: 09/29/22 10:49 AM   Specimen: Urine   UR  Result Value Ref Range   Urine Culture, Comprehensive Final report (A)    Organism ID, Bacteria Klebsiella pneumoniae (A)     Comment: Cefazolin <=4 ug/mL Cefazolin with an MIC <=16 predicts susceptibility to the oral agents cefaclor, cefdinir, cefpodoxime, cefprozil, cefuroxime,  cephalexin, and loracarbef when used for therapy of uncomplicated urinary tract infections due to E. coli, Klebsiella pneumoniae, and Proteus mirabilis. 25,000-50,000 colony forming units per mL    ANTIMICROBIAL SUSCEPTIBILITY Comment     Comment:       ** S = Susceptible; I = Intermediate; R = Resistant **                    P = Positive; N = Negative             MICS are expressed in micrograms per mL    Antibiotic                 RSLT#1    RSLT#2    RSLT#3    RSLT#4 Amoxicillin/Clavulanic Acid    S Ampicillin                     R Cefepime                       S Ceftriaxone                    S Cefuroxime                     S Ciprofloxacin                  S Ertapenem                      S Gentamicin                     S Imipenem                       S Levofloxacin                   S Meropenem                      S Nitrofurantoin                 R Piperacillin/Tazobactam        S Tetracycline                   S Tobramycin                     S Trimethoprim/Sulfa             S   Urinalysis, Complete     Status: Abnormal   Collection Time: 11/04/22  2:36 PM  Result Value Ref Range   Specific Gravity, UA 1.025 1.005 - 1.030   pH, UA 5.5 5.0 - 7.5   Color, UA Yellow Yellow   Appearance Ur Hazy (A) Clear   Leukocytes,UA 1+ (A) Negative   Protein,UA 1+ (A) Negative/Trace   Glucose, UA Negative Negative   Ketones, UA Trace (A)  Negative   RBC, UA Negative Negative   Bilirubin, UA Negative Negative   Urobilinogen, Ur 1.0 0.2 - 1.0 mg/dL   Nitrite, UA Negative Negative   Microscopic Examination See below:   Microscopic Examination     Status: Abnormal   Collection Time: 11/04/22  2:36 PM   Urine  Result Value Ref Range   WBC, UA 11-30 (A) 0 - 5 /hpf   RBC, Urine 0-2 0 - 2 /hpf   Epithelial Cells (non renal) >10 (A) 0 - 10 /hpf   Casts Present (A) None seen /lpf   Cast Type Hyaline casts N/A    Comment: Granular casts   Mucus, UA Present (A) Not Estab.    Bacteria, UA Moderate (A) None seen/Few    Radiology Ultrasound renal complete  Result Date: 10/20/2022 CLINICAL DATA:  UTI EXAM: RENAL / URINARY TRACT ULTRASOUND COMPLETE COMPARISON:  None Available. FINDINGS: Right Kidney: Renal measurements: 11.3 x 5.9 x 4.9 cm = volume: 170 mL. Increased echogenicity. Left Kidney: Renal measurements: 11.1 x 5.7 x 4.4 cm = volume: 145 mL. Increased cortical echogenicity. Contains a 10 mm cyst. No follow-up imaging recommended for the cyst. Bladder: Appears normal for degree of bladder distention. Other: None. IMPRESSION: Increased cortical echogenicity in the kidneys is nonspecific but often seen with medical renal disease. No other abnormalities. Electronically Signed   By: Dorise Bullion III M.D.   On: 10/20/2022 16:51    Assessment/Plan Diabetic polyneuropathy associated with type 2 diabetes mellitus (HCC) blood glucose control important in reducing the progression of atherosclerotic disease. Also, involved in wound healing. On appropriate medications.     HLD (hyperlipidemia) lipid control important in reducing the progression of atherosclerotic disease. Continue statin therapy   Carotid stenosis Carotid duplex last month reveals a patent right carotid endarterectomy and stable 40 to 59% left ICA stenosis.  Continue current medical regimen including antiplatelet and statin agents.  Recheck in 6 months with duplex.  Hypertension, renovascular BP control is suboptimal at this point.  Angiogram to follow.   Renal artery stenosis (HCC) Although by duplex criteria, there is not a clear high-grade stenosis, there does appear to be some elevated velocities and some degree of stenosis in both renal arteries.  At this point, with my clinical suspicion, I would recommend a renal angiogram with possible invention for further evaluation for significant renal artery stenosis.  If significant renal artery stenosis is identified, renal artery stenting would be  planned at that time.  Risks and benefits were discussed with the patient and she is agreeable to proceed.    Leotis Pain, MD  11/12/2022 3:41 PM    This note was created with Dragon medical transcription system.  Any errors from dictation are purely unintentional

## 2022-11-12 NOTE — Assessment & Plan Note (Signed)
BP control is suboptimal at this point.  Angiogram to follow.

## 2022-11-15 ENCOUNTER — Telehealth (INDEPENDENT_AMBULATORY_CARE_PROVIDER_SITE_OTHER): Payer: Self-pay

## 2022-11-15 NOTE — Telephone Encounter (Signed)
Patient called and left a message stating she went to her cardiologist and has decided to wait and not do a renal angiogram any time soon.

## 2022-11-24 ENCOUNTER — Other Ambulatory Visit (INDEPENDENT_AMBULATORY_CARE_PROVIDER_SITE_OTHER): Payer: Self-pay | Admitting: Nurse Practitioner

## 2022-11-29 ENCOUNTER — Telehealth (INDEPENDENT_AMBULATORY_CARE_PROVIDER_SITE_OTHER): Payer: Self-pay

## 2022-11-29 NOTE — Telephone Encounter (Signed)
Spoke with the patient to schedule her for a renal angio with Dr. Wyn Quaker. Patient stated she did not want to have the procedure. Patient will not be contacted again regarding this procedure.

## 2023-02-10 ENCOUNTER — Ambulatory Visit: Payer: Medicare PPO | Admitting: Physician Assistant

## 2023-02-10 VITALS — BP 202/75 | HR 65

## 2023-02-10 DIAGNOSIS — R300 Dysuria: Secondary | ICD-10-CM | POA: Diagnosis not present

## 2023-02-10 LAB — URINALYSIS, COMPLETE
Bilirubin, UA: NEGATIVE
Glucose, UA: NEGATIVE
Ketones, UA: NEGATIVE
Nitrite, UA: NEGATIVE
Protein,UA: NEGATIVE
RBC, UA: NEGATIVE
Specific Gravity, UA: 1.02 (ref 1.005–1.030)
Urobilinogen, Ur: 1 mg/dL (ref 0.2–1.0)
pH, UA: 7 (ref 5.0–7.5)

## 2023-02-10 LAB — MICROSCOPIC EXAMINATION
Epithelial Cells (non renal): >10 /hpf — AB (ref 0–10)
WBC, UA: >30 /hpf — AB (ref 0–5)

## 2023-02-10 MED ORDER — CEFUROXIME AXETIL 250 MG PO TABS
250.0000 mg | ORAL_TABLET | Freq: Two times a day (BID) | ORAL | 0 refills | Status: AC
Start: 2023-02-10 — End: 2023-02-17

## 2023-02-10 MED ORDER — PHENAZOPYRIDINE HCL 200 MG PO TABS: 200.0000 mg | ORAL_TABLET | Freq: Three times a day (TID) | ORAL | 2 refills | Status: DC | PRN

## 2023-02-10 NOTE — Progress Notes (Signed)
02/10/2023 12:44 PM   Mary [name] 12-11-1934 [id number]  CC: Chief Complaint  Patient presents with   Urinary Tract Infection   HPI: Mary [name] is a 88 y.o. female with PMH occasional UTI and OAB on Gemtesa who presents today for evaluation of possible UTI.   Today she reports a 3-week history of dysuria, chills, nausea, and low back pain.  She denies gross hematuria, fevers, or vomiting.  She remains on [place].  She occasionally takes Pyridium, which helps.  She feels that she is getting back-to-back UTIs and that she is not able to clear them.  In-office UA today positive for 2+ leukocytes; urine microscopy with >30 WBCs/HPF, >10 epithelial cells/hpf, and moderate bacteria.  Notably, per chart review her last 2 urine microscopies appeared contaminated with epithelial cells as well.  PMH: Past Medical History:  Diagnosis Date   Acid reflux    Anemia    Arthritis    Asthma    Atherosclerosis of abdominal aorta (HCC)    B12 deficiency    Clostridium difficile infection    H/O   Coronary artery disease    DDD (degenerative disc disease), lumbar    Depression    Diabetes (HCC)    type 2   Diabetic retinopathy (HCC)    Dysrhythmia    Heart murmur    HLD (hyperlipidemia)    HTN (hypertension)    Hypotension    when get up too quickly   Hypothyroidism    Pneumonia    PONV (postoperative nausea and vomiting)    Pulmonary nodule    right middle lobe on CT scan   Pure hypercholesterolemia    RA (rheumatoid arthritis) (HCC)    Sleep apnea    Spinal stenosis    Urinary urgency     Surgical History: Past Surgical History:  Procedure Laterality Date   ABDOMINAL HYSTERECTOMY     CATARACT EXTRACTION, BILATERAL     COLONOSCOPY N/A 07/29/2021   Procedure: COLONOSCOPY;  Surgeon: Toledo, Boykin Nearing, MD;  Location: [facility];  Service: Gastroenterology;  Laterality: N/A;   CYSTOSCOPY     ENDARTERECTOMY Right 12/02/2021   Procedure: ENDARTERECTOMY CAROTID;   Surgeon: Annice Needy, MD;  Location: [facility];  Service: Vascular;  Laterality: Right;   ESOPHAGOGASTRODUODENOSCOPY N/A 07/29/2021   Procedure: ESOPHAGOGASTRODUODENOSCOPY (EGD);  Surgeon: Toledo, Boykin Nearing, MD;  Location: [facility];  Service: Gastroenterology;  Laterality: N/A;  DM   HIP ARTHROPLASTY Right 09/24/2015   Procedure: ARTHROPLASTY BIPOLAR HIP (HEMIARTHROPLASTY);  Surgeon: Christena Flake, MD;  Location: [facility];  Service: Orthopedics;  Laterality: Right;   HIP ARTHROPLASTY Left 09/29/2018   Procedure: ARTHROPLASTY BIPOLAR HIP (HEMIARTHROPLASTY) LEFT;  Surgeon: Christena Flake, MD;  Location: [facility];  Service: Orthopedics;  Laterality: Left;   KNEE ARTHROSCOPY W/ AUTOGENOUS CARTILAGE IMPLANTATION (ACI) PROCEDURE     TOTAL KNEE ARTHROPLASTY Left 12/01/2017   Procedure: TOTAL KNEE ARTHROPLASTY;  Surgeon: Christena Flake, MD;  Location: [facility];  Service: Orthopedics;  Laterality: Left;   TOTAL KNEE ARTHROPLASTY Right 2014   VARICOSE VEIN SURGERY Left    leg    Home Medications:  Allergies as of 02/10/2023       Reactions   Cephalexin Hives   Nitrofurantoin    Other reaction(s): Other (See Comments) Other Reaction: measles-like lesions   Sulfa Antibiotics Hives   Atorvastatin Other (See Comments)   Muscle spasms/ muscle pain        Medication List  Accurate as of February 10, 2023 12:44 PM. If you have any questions, ask your nurse or doctor.          acetaminophen 500 MG tablet Commonly known as: TYLENOL Take 500 mg by mouth every 6 (six) hours as needed.   amLODipine 10 MG tablet Commonly known as: NORVASC Take 1 tablet (10 mg total) by mouth daily.   aspirin EC 81 MG tablet Take 1 tablet (81 mg total) by mouth daily. Swallow whole.   azelastine 0.05 % ophthalmic solution Commonly known as: OPTIVAR Place 1 drop into both eyes daily as needed (allergies).   busPIRone 10 MG tablet Commonly known as: BUSPAR Take 20 mg by mouth 2 (two) times daily.    cefUROXime 250 MG tablet Commonly known as: CEFTIN Take 1 tablet (250 mg total) by mouth 2 (two) times daily with a meal for 7 days.   clopidogrel 75 MG tablet Commonly known as: PLAVIX Take 1 tablet (75 mg total) by mouth daily at 6 (six) AM.   doxycycline 100 MG tablet Commonly known as: VIBRA-TABS Take 1 tablet (100 mg total) by mouth 2 (two) times daily.   ezetimibe 10 MG tablet Commonly known as: ZETIA Take 10 mg by mouth at bedtime.   furosemide 20 MG tablet Commonly known as: LASIX Take 1 tablet (20 mg total) by mouth daily.   Gemtesa 75 MG Tabs Generic drug: Vibegron Take 1 tablet (75 mg total) by mouth daily.   Gemtesa 75 MG Tabs Generic drug: Vibegron Take 1 tablet (75 mg total) by mouth daily.   hydrALAZINE 100 MG tablet Commonly known as: APRESOLINE Take 1 tablet (100 mg total) by mouth 3 (three) times daily.   hydrochlorothiazide 25 MG tablet Commonly known as: HYDRODIURIL Take 1 tablet (25 mg total) by mouth daily.   levothyroxine 75 MCG tablet Commonly known as: SYNTHROID Take 75 mcg by mouth daily.   metFORMIN 500 MG 24 hr tablet Commonly known as: GLUCOPHAGE-XR Take 500 mg by mouth daily.   omeprazole 40 MG capsule Commonly known as: PRILOSEC Take 40 mg by mouth daily.   oxyCODONE-acetaminophen 5-325 MG tablet Commonly known as: PERCOCET/ROXICET Take 1-2 tablets by mouth every 4 (four) hours as needed for moderate pain.   PARoxetine 40 MG tablet Commonly known as: PAXIL Take 40 mg by mouth every evening.   phenazopyridine 200 MG tablet Commonly known as: Pyridium Take 1 tablet (200 mg total) by mouth 3 (three) times daily as needed for pain. Do not take for longer than 2 days consecutively.   rosuvastatin 5 MG tablet Commonly known as: CRESTOR Take 1 tablet (5 mg total) by mouth daily.   traZODone 50 MG tablet Commonly known as: DESYREL Take 1 tablet (50 mg total) by mouth at bedtime as needed for sleep. What changed: how much to  take   valsartan 160 MG tablet Commonly known as: DIOVAN Take 160 mg by mouth daily.   vitamin B-12 500 MCG tablet Commonly known as: CYANOCOBALAMIN Take 500 mcg by mouth every other day.   Vitron-C 65-125 MG Tabs Generic drug: Iron-Vitamin C Take 1 tablet by mouth daily. What changed: when to take this        Allergies:  Allergies  Allergen Reactions   Cephalexin Hives   Nitrofurantoin     Other reaction(s): Other (See Comments) Other Reaction: measles-like lesions   Sulfa Antibiotics Hives   Atorvastatin Other (See Comments)    Muscle spasms/ muscle pain    Family History: Family  History  Problem Relation Age of Onset   Kidney disease Brother        also nephew   Heart disease Mother    Heart disease Father    Prostate cancer Neg Hx    Bladder Cancer Neg Hx    Breast cancer Neg Hx    Kidney cancer Neg Hx     Social History:   reports that she has never smoked. She has never used smokeless tobacco. She reports that she does not drink alcohol and does not use drugs.  Physical Exam: BP (!) 221/82   Pulse 65   Constitutional:  Alert and oriented, no acute distress, nontoxic appearing HEENT: [place], AT Cardiovascular: No clubbing, cyanosis, or edema Respiratory: Normal respiratory effort, no increased work of breathing Skin: No rashes, bruises or suspicious lesions Neurologic: Grossly intact, no focal deficits, moving all 4 extremities Psychiatric: Normal mood and affect  Laboratory Data: Results for orders placed or performed in visit on 02/10/23  Microscopic Examination   Urine  Result Value Ref Range   WBC, UA >30 (A) 0 - 5 /hpf   RBC, Urine 0-2 0 - 2 /hpf   Epithelial Cells (non renal) >10 (A) 0 - 10 /hpf   Bacteria, UA Moderate (A) None seen/Few  Urinalysis, Complete  Result Value Ref Range   Specific Gravity, UA 1.020 1.005 - 1.030   pH, UA 7.0 5.0 - 7.5   Color, UA Yellow Yellow   Appearance Ur Cloudy (A) Clear   Leukocytes,UA 2+ (A) Negative    Protein,UA Negative Negative/Trace   Glucose, UA Negative Negative   Ketones, UA Negative Negative   RBC, UA Negative Negative   Bilirubin, UA Negative Negative   Urobilinogen, Ur 1.0 0.2 - 1.0 mg/dL   Nitrite, UA Negative Negative   Microscopic Examination See below:    Assessment & Plan:   1. Dysuria UA today notable for pyuria and bacteriuria, however it appears contaminated with epithelial cells, which has been common for her on chart review.  Would recommend obtaining catheterized urine samples in the future.  Will start empiric cefuroxime and send for culture for further evaluation.  Given reports of continued symptoms, suspect underlying chronic cystitis and she may benefit from vaginal estrogen cream in the future. - Urinalysis, Complete - CULTURE, URINE COMPREHENSIVE - phenazopyridine (PYRIDIUM) 200 MG tablet; Take 1 tablet (200 mg total) by mouth 3 (three) times daily as needed for pain. Do not take for longer than 2 days consecutively.  Dispense: 30 tablet; Refill: 2 - cefUROXime (CEFTIN) 250 MG tablet; Take 1 tablet (250 mg total) by mouth 2 (two) times daily with a meal for 7 days.  Dispense: 14 tablet; Refill: 0   Return if symptoms worsen or fail to improve.  Carman Ching, PA-C  [facility] [place] [address] [place], [place] [address] [phone]

## 2023-02-10 NOTE — Progress Notes (Signed)
Mary [name] presents for an office/procedure visit. BP today is 221/82 . She is complaint with BP medication. Greater than 140/90. Provider  notified. Pt advised to follow up with PCP . Pt voiced understanding.

## 2023-02-15 LAB — CULTURE, URINE COMPREHENSIVE

## 2023-04-19 ENCOUNTER — Ambulatory Visit (INDEPENDENT_AMBULATORY_CARE_PROVIDER_SITE_OTHER): Payer: Medicare PPO

## 2023-04-19 ENCOUNTER — Ambulatory Visit (INDEPENDENT_AMBULATORY_CARE_PROVIDER_SITE_OTHER): Payer: Medicare PPO | Admitting: Vascular Surgery

## 2023-04-19 DIAGNOSIS — I6521 Occlusion and stenosis of right carotid artery: Secondary | ICD-10-CM | POA: Diagnosis not present

## 2023-05-09 ENCOUNTER — Ambulatory Visit: Payer: Medicare PPO | Admitting: Urology

## 2023-05-09 VITALS — BP 161/70 | HR 55 | Ht 65.0 in | Wt 164.0 lb

## 2023-05-09 DIAGNOSIS — R300 Dysuria: Secondary | ICD-10-CM

## 2023-05-09 DIAGNOSIS — Z87440 Personal history of urinary (tract) infections: Secondary | ICD-10-CM | POA: Diagnosis not present

## 2023-05-09 DIAGNOSIS — R399 Unspecified symptoms and signs involving the genitourinary system: Secondary | ICD-10-CM | POA: Diagnosis not present

## 2023-05-09 DIAGNOSIS — N3281 Overactive bladder: Secondary | ICD-10-CM

## 2023-05-09 DIAGNOSIS — N390 Urinary tract infection, site not specified: Secondary | ICD-10-CM

## 2023-05-09 LAB — MICROSCOPIC EXAMINATION: WBC, UA: >30 /HPF — AB (ref 0–5)

## 2023-05-09 LAB — URINALYSIS, COMPLETE
Bilirubin, UA: NEGATIVE
Glucose, UA: NEGATIVE
Ketones, UA: NEGATIVE
Nitrite, UA: NEGATIVE
Protein,UA: NEGATIVE
RBC, UA: NEGATIVE
Specific Gravity, UA: 1.015 (ref 1.005–1.030)
Urobilinogen, Ur: 0.2 mg/dL (ref 0.2–1.0)
pH, UA: 5.5 (ref 5.0–7.5)

## 2023-05-09 MED ORDER — DOXYCYCLINE HYCLATE 100 MG PO CAPS
100.0000 mg | ORAL_CAPSULE | Freq: Two times a day (BID) | ORAL | 0 refills | Status: AC
Start: 2023-05-09 — End: 2023-05-16

## 2023-05-09 NOTE — Progress Notes (Signed)
I, Mary [name], acting as a scribe for Mary Altes, MD., have documented all relevant documentation on the behalf of Mary Altes, MD, as directed by Mary Altes, MD while in the presence of Mary Altes, MD.  05/09/2023 1:49 PM   Mary [name] March 13, 1935 [id number]  Referring provider: Marisue Ivan, MD [phone] [facility] [address] [facility] [place],  [place] [address]  Chief Complaint  Patient presents with   Follow-up   Urinary Incontinence   Urologic history: Recurrent UTI  HPI: Mary [name] is a 88 y.o. female presents for a 6 month follow-up.   Initially seen 09/29/2022 for recurrent UTI. Cystoscopy performed 11/10/22 showed no abnormalities.  She had been given Gemtesa for OAB symptoms, which did help her symptoms, though she did not restart after running out of samples.  She did see Hilton Sinclair 02/10/23 for UTI and culture was positive for Klebsiella. RUS showed no upper tract abnormalities.  Today she is having mild dysuria. No longer taking Gemtesa, though her OAB symptoms are not that bothersome.   PMH: Past Medical History:  Diagnosis Date   Acid reflux    Anemia    Arthritis    Asthma    Atherosclerosis of abdominal aorta (HCC)    B12 deficiency    Clostridium difficile infection    H/O   Coronary artery disease    DDD (degenerative disc disease), lumbar    Depression    Diabetes (HCC)    type 2   Diabetic retinopathy (HCC)    Dysrhythmia    Heart murmur    HLD (hyperlipidemia)    HTN (hypertension)    Hypotension    when get up too quickly   Hypothyroidism    Pneumonia    PONV (postoperative nausea and vomiting)    Pulmonary nodule    right middle lobe on CT scan   Pure hypercholesterolemia    RA (rheumatoid arthritis) (HCC)    Sleep apnea    Spinal stenosis    Urinary urgency     Surgical History: Past Surgical History:  Procedure Laterality Date   ABDOMINAL HYSTERECTOMY     CATARACT EXTRACTION,  BILATERAL     COLONOSCOPY N/A 07/29/2021   Procedure: COLONOSCOPY;  Surgeon: Toledo, Boykin Nearing, MD;  Location: [facility];  Service: Gastroenterology;  Laterality: N/A;   CYSTOSCOPY     ENDARTERECTOMY Right 12/02/2021   Procedure: ENDARTERECTOMY CAROTID;  Surgeon: Annice Needy, MD;  Location: [facility];  Service: Vascular;  Laterality: Right;   ESOPHAGOGASTRODUODENOSCOPY N/A 07/29/2021   Procedure: ESOPHAGOGASTRODUODENOSCOPY (EGD);  Surgeon: Toledo, Boykin Nearing, MD;  Location: [facility];  Service: Gastroenterology;  Laterality: N/A;  DM   HIP ARTHROPLASTY Right 09/24/2015   Procedure: ARTHROPLASTY BIPOLAR HIP (HEMIARTHROPLASTY);  Surgeon: Christena Flake, MD;  Location: [facility];  Service: Orthopedics;  Laterality: Right;   HIP ARTHROPLASTY Left 09/29/2018   Procedure: ARTHROPLASTY BIPOLAR HIP (HEMIARTHROPLASTY) LEFT;  Surgeon: Christena Flake, MD;  Location: [facility];  Service: Orthopedics;  Laterality: Left;   KNEE ARTHROSCOPY W/ AUTOGENOUS CARTILAGE IMPLANTATION (ACI) PROCEDURE     TOTAL KNEE ARTHROPLASTY Left 12/01/2017   Procedure: TOTAL KNEE ARTHROPLASTY;  Surgeon: Christena Flake, MD;  Location: [facility];  Service: Orthopedics;  Laterality: Left;   TOTAL KNEE ARTHROPLASTY Right 2014   VARICOSE VEIN SURGERY Left    leg    Home Medications:  Allergies as of 05/09/2023       Reactions   Cephalexin  Hives   Nitrofurantoin    Other reaction(s): Other (See Comments) Other Reaction: measles-like lesions   Sulfa Antibiotics Hives   Atorvastatin Other (See Comments)   Muscle spasms/ muscle pain        Medication List        Accurate as of May 09, 2023  1:49 PM. If you have any questions, ask your nurse or doctor.          acetaminophen 500 MG tablet Commonly known as: TYLENOL Take 500 mg by mouth every 6 (six) hours as needed.   amLODipine 10 MG tablet Commonly known as: NORVASC Take 1 tablet (10 mg total) by mouth daily.   aspirin EC 81 MG tablet Take 1 tablet  (81 mg total) by mouth daily. Swallow whole.   azelastine 0.05 % ophthalmic solution Commonly known as: OPTIVAR Place 1 drop into both eyes daily as needed (allergies).   busPIRone 10 MG tablet Commonly known as: BUSPAR Take 20 mg by mouth 2 (two) times daily.   clopidogrel 75 MG tablet Commonly known as: PLAVIX Take 1 tablet (75 mg total) by mouth daily at 6 (six) AM.   doxycycline 100 MG tablet Commonly known as: VIBRA-TABS Take 1 tablet (100 mg total) by mouth 2 (two) times daily. What changed: Another medication with the same name was added. Make sure you understand how and when to take each.   doxycycline 100 MG capsule Commonly known as: VIBRAMYCIN Take 1 capsule (100 mg total) by mouth every 12 (twelve) hours for 7 days. What changed: You were already taking a medication with the same name, and this prescription was added. Make sure you understand how and when to take each.   ezetimibe 10 MG tablet Commonly known as: ZETIA Take 10 mg by mouth at bedtime.   furosemide 20 MG tablet Commonly known as: LASIX Take 1 tablet (20 mg total) by mouth daily.   Gemtesa 75 MG Tabs Generic drug: Vibegron Take 1 tablet (75 mg total) by mouth daily.   Gemtesa 75 MG Tabs Generic drug: Vibegron Take 1 tablet (75 mg total) by mouth daily.   hydrALAZINE 100 MG tablet Commonly known as: APRESOLINE Take 1 tablet (100 mg total) by mouth 3 (three) times daily.   hydrochlorothiazide 25 MG tablet Commonly known as: HYDRODIURIL Take 1 tablet (25 mg total) by mouth daily.   levothyroxine 75 MCG tablet Commonly known as: SYNTHROID Take 75 mcg by mouth daily.   metFORMIN 500 MG 24 hr tablet Commonly known as: GLUCOPHAGE-XR Take 500 mg by mouth daily.   omeprazole 40 MG capsule Commonly known as: PRILOSEC Take 40 mg by mouth daily.   oxyCODONE-acetaminophen 5-325 MG tablet Commonly known as: PERCOCET/ROXICET Take 1-2 tablets by mouth every 4 (four) hours as needed for moderate  pain.   PARoxetine 40 MG tablet Commonly known as: PAXIL Take 40 mg by mouth every evening.   phenazopyridine 200 MG tablet Commonly known as: Pyridium Take 1 tablet (200 mg total) by mouth 3 (three) times daily as needed for pain. Do not take for longer than 2 days consecutively.   rosuvastatin 5 MG tablet Commonly known as: CRESTOR Take 1 tablet (5 mg total) by mouth daily.   traZODone 50 MG tablet Commonly known as: DESYREL Take 1 tablet (50 mg total) by mouth at bedtime as needed for sleep. What changed: how much to take   valsartan 160 MG tablet Commonly known as: DIOVAN Take 160 mg by mouth daily.   vitamin B-12  500 MCG tablet Commonly known as: CYANOCOBALAMIN Take 500 mcg by mouth every other day.   Vitron-C 65-125 MG Tabs Generic drug: Iron-Vitamin C Take 1 tablet by mouth daily. What changed: when to take this        Allergies:  Allergies  Allergen Reactions   Cephalexin Hives   Nitrofurantoin     Other reaction(s): Other (See Comments) Other Reaction: measles-like lesions   Sulfa Antibiotics Hives   Atorvastatin Other (See Comments)    Muscle spasms/ muscle pain    Family History: Family History  Problem Relation Age of Onset   Kidney disease Brother        also nephew   Heart disease Mother    Heart disease Father    Prostate cancer Neg Hx    Bladder Cancer Neg Hx    Breast cancer Neg Hx    Kidney cancer Neg Hx     Social History:  reports that she has never smoked. She has never used smokeless tobacco. She reports that she does not drink alcohol and does not use drugs.   Physical Exam: BP (!) 161/70   Pulse (!) 55   Ht 5\' 5"  (1.651 m)   Wt 164 lb (74.4 kg)   BMI 27.29 kg/m   Constitutional:  Alert, No acute distress. HEENT: [place] AT Respiratory: Normal respiratory effort, no increased work of breathing. Psychiatric: Normal mood and affect.   Urinalysis Dipstick 2+ leukocyte/microscopy >30, WBC.    Assessment & Plan:    1.  Recurrent UTI Urine culture ordered.  She has a history of hives with Keflex, though was treated with cefuroxime in June 2024. Will empirically started doxycycline 100 mg twice daily.  Discussed low-dose vaginal estrogen and low-dose antibiotic prophylaxis.  If she elects vaginal estrogen would also give a 90-day course of low-dose antibiotic prophylaxis.  She will think over these options and will decide once notified on culture results.   I have reviewed the above documentation for accuracy and completeness, and I agree with the above.   Mary Altes, MD  [facility] [address], [address] [place], [place] [address] [phone]

## 2023-05-10 ENCOUNTER — Encounter: Payer: Self-pay | Admitting: Urology

## 2023-05-12 LAB — CULTURE, URINE COMPREHENSIVE

## 2023-06-27 ENCOUNTER — Other Ambulatory Visit (INDEPENDENT_AMBULATORY_CARE_PROVIDER_SITE_OTHER): Payer: Self-pay | Admitting: Nurse Practitioner

## 2023-07-23 ENCOUNTER — Emergency Department: Payer: Medicare PPO

## 2023-07-23 ENCOUNTER — Inpatient Hospital Stay
Admission: EM | Admit: 2023-07-23 | Discharge: 2023-07-25 | DRG: 305 | Disposition: A | Discharge status: Home / Self Care | Payer: Medicare PPO | Attending: Internal Medicine | Admitting: Internal Medicine

## 2023-07-23 ENCOUNTER — Other Ambulatory Visit: Payer: Self-pay

## 2023-07-23 ENCOUNTER — Encounter: Payer: Self-pay | Admitting: Intensive Care

## 2023-07-23 DIAGNOSIS — E11319 Type 2 diabetes mellitus with unspecified diabetic retinopathy without macular edema: Secondary | ICD-10-CM | POA: Diagnosis present

## 2023-07-23 DIAGNOSIS — F32A Depression, unspecified: Secondary | ICD-10-CM | POA: Diagnosis present

## 2023-07-23 DIAGNOSIS — E1169 Type 2 diabetes mellitus with other specified complication: Secondary | ICD-10-CM | POA: Diagnosis present

## 2023-07-23 DIAGNOSIS — R109 Unspecified abdominal pain: Secondary | ICD-10-CM | POA: Diagnosis not present

## 2023-07-23 DIAGNOSIS — Z79899 Other long term (current) drug therapy: Secondary | ICD-10-CM | POA: Diagnosis not present

## 2023-07-23 DIAGNOSIS — E7800 Pure hypercholesterolemia, unspecified: Secondary | ICD-10-CM | POA: Diagnosis present

## 2023-07-23 DIAGNOSIS — Z66 Do not resuscitate: Secondary | ICD-10-CM | POA: Diagnosis present

## 2023-07-23 DIAGNOSIS — N136 Pyonephrosis: Secondary | ICD-10-CM | POA: Diagnosis present

## 2023-07-23 DIAGNOSIS — I160 Hypertensive urgency: Secondary | ICD-10-CM | POA: Diagnosis present

## 2023-07-23 DIAGNOSIS — Z79890 Hormone replacement therapy (postmenopausal): Secondary | ICD-10-CM

## 2023-07-23 DIAGNOSIS — Z883 Allergy status to other anti-infective agents status: Secondary | ICD-10-CM

## 2023-07-23 DIAGNOSIS — E785 Hyperlipidemia, unspecified: Secondary | ICD-10-CM | POA: Diagnosis not present

## 2023-07-23 DIAGNOSIS — E876 Hypokalemia: Secondary | ICD-10-CM | POA: Diagnosis present

## 2023-07-23 DIAGNOSIS — B961 Klebsiella pneumoniae [K. pneumoniae] as the cause of diseases classified elsewhere: Secondary | ICD-10-CM | POA: Diagnosis present

## 2023-07-23 DIAGNOSIS — Z96653 Presence of artificial knee joint, bilateral: Secondary | ICD-10-CM | POA: Diagnosis present

## 2023-07-23 DIAGNOSIS — Z7982 Long term (current) use of aspirin: Secondary | ICD-10-CM | POA: Diagnosis not present

## 2023-07-23 DIAGNOSIS — E119 Type 2 diabetes mellitus without complications: Secondary | ICD-10-CM | POA: Diagnosis not present

## 2023-07-23 DIAGNOSIS — M069 Rheumatoid arthritis, unspecified: Secondary | ICD-10-CM | POA: Diagnosis present

## 2023-07-23 DIAGNOSIS — Z7902 Long term (current) use of antithrombotics/antiplatelets: Secondary | ICD-10-CM | POA: Diagnosis not present

## 2023-07-23 DIAGNOSIS — N390 Urinary tract infection, site not specified: Secondary | ICD-10-CM | POA: Diagnosis present

## 2023-07-23 DIAGNOSIS — I10 Essential (primary) hypertension: Secondary | ICD-10-CM | POA: Diagnosis present

## 2023-07-23 DIAGNOSIS — Z96643 Presence of artificial hip joint, bilateral: Secondary | ICD-10-CM | POA: Diagnosis present

## 2023-07-23 DIAGNOSIS — Z881 Allergy status to other antibiotic agents status: Secondary | ICD-10-CM | POA: Diagnosis not present

## 2023-07-23 DIAGNOSIS — D509 Iron deficiency anemia, unspecified: Secondary | ICD-10-CM | POA: Diagnosis present

## 2023-07-23 DIAGNOSIS — Z882 Allergy status to sulfonamides status: Secondary | ICD-10-CM

## 2023-07-23 DIAGNOSIS — Z1611 Resistance to penicillins: Secondary | ICD-10-CM | POA: Diagnosis present

## 2023-07-23 DIAGNOSIS — E039 Hypothyroidism, unspecified: Secondary | ICD-10-CM | POA: Diagnosis present

## 2023-07-23 DIAGNOSIS — I169 Hypertensive crisis, unspecified: Secondary | ICD-10-CM

## 2023-07-23 DIAGNOSIS — I2510 Atherosclerotic heart disease of native coronary artery without angina pectoris: Secondary | ICD-10-CM | POA: Diagnosis present

## 2023-07-23 DIAGNOSIS — R42 Dizziness and giddiness: Secondary | ICD-10-CM | POA: Diagnosis present

## 2023-07-23 DIAGNOSIS — Z8249 Family history of ischemic heart disease and other diseases of the circulatory system: Secondary | ICD-10-CM | POA: Diagnosis not present

## 2023-07-23 DIAGNOSIS — N1330 Unspecified hydronephrosis: Secondary | ICD-10-CM | POA: Diagnosis not present

## 2023-07-23 DIAGNOSIS — Z7984 Long term (current) use of oral hypoglycemic drugs: Secondary | ICD-10-CM | POA: Diagnosis not present

## 2023-07-23 DIAGNOSIS — Z888 Allergy status to other drugs, medicaments and biological substances status: Secondary | ICD-10-CM

## 2023-07-23 DIAGNOSIS — Z90710 Acquired absence of both cervix and uterus: Secondary | ICD-10-CM

## 2023-07-23 LAB — BASIC METABOLIC PANEL
Anion gap: 9 (ref 5–15)
BUN: 13 mg/dL (ref 8–23)
CO2: 29 mmol/L (ref 22–32)
Calcium: 9.5 mg/dL (ref 8.9–10.3)
Chloride: 101 mmol/L (ref 98–111)
Creatinine, Ser: 0.77 mg/dL (ref 0.44–1.00)
GFR, Estimated: >60 mL/min (ref >60)
Glucose, Bld: 127 mg/dL — ABNORMAL HIGH (ref 70–99)
Potassium: 3.7 mmol/L (ref 3.5–5.1)
Sodium: 139 mmol/L (ref 135–145)

## 2023-07-23 LAB — URINALYSIS, ROUTINE W REFLEX MICROSCOPIC
Bilirubin Urine: NEGATIVE
Glucose, UA: NEGATIVE mg/dL
Ketones, ur: NEGATIVE mg/dL
Nitrite: POSITIVE — AB
Protein, ur: NEGATIVE mg/dL
Specific Gravity, Urine: 1.011 (ref 1.005–1.030)
pH: 6 (ref 5.0–8.0)

## 2023-07-23 LAB — CBC
HCT: 43.5 % (ref 36.0–46.0)
Hemoglobin: 13.8 g/dL (ref 12.0–15.0)
MCH: 31.3 pg (ref 26.0–34.0)
MCHC: 31.7 g/dL (ref 30.0–36.0)
MCV: 98.6 fL (ref 80.0–100.0)
Platelets: 132 10*3/uL — ABNORMAL LOW (ref 150–400)
RBC: 4.41 MIL/uL (ref 3.87–5.11)
RDW: 12.9 % (ref 11.5–15.5)
WBC: 6.9 10*3/uL (ref 4.0–10.5)
nRBC: 0 % (ref 0.0–0.2)

## 2023-07-23 LAB — GLUCOSE, CAPILLARY: Glucose-Capillary: 120 mg/dL — ABNORMAL HIGH (ref 70–99)

## 2023-07-23 MED ORDER — INSULIN ASPART 100 UNIT/ML IJ SOLN
0.0000 [IU] | Freq: Three times a day (TID) | INTRAMUSCULAR | Status: DC
  Administered 2023-07-24: 3 [IU] via SUBCUTANEOUS
  Administered 2023-07-24: 2 [IU] via SUBCUTANEOUS
  Administered 2023-07-25: 3 [IU] via SUBCUTANEOUS
  Administered 2023-07-25: 2 [IU] via SUBCUTANEOUS
  Filled 2023-07-23 (×4): qty 1

## 2023-07-23 MED ORDER — ACETAMINOPHEN 650 MG RE SUPP: 650.0000 mg | Freq: Four times a day (QID) | RECTAL | Status: DC | PRN

## 2023-07-23 MED ORDER — HYDRALAZINE HCL 50 MG PO TABS
100.0000 mg | ORAL_TABLET | Freq: Three times a day (TID) | ORAL | Status: DC
  Administered 2023-07-23 – 2023-07-25 (×5): 100 mg via ORAL
  Filled 2023-07-23 (×5): qty 2

## 2023-07-23 MED ORDER — TRAZODONE HCL 50 MG PO TABS
25.0000 mg | ORAL_TABLET | Freq: Every day | ORAL | Status: DC
  Administered 2023-07-23 – 2023-07-24 (×2): 50 mg via ORAL
  Filled 2023-07-23 (×2): qty 1

## 2023-07-23 MED ORDER — TAMSULOSIN HCL 0.4 MG PO CAPS: 0.4000 mg | ORAL_CAPSULE | Freq: Every day | ORAL | 0 refills | Status: AC

## 2023-07-23 MED ORDER — HYDRALAZINE HCL 20 MG/ML IJ SOLN: 20.0000 mg | Freq: Three times a day (TID) | INTRAMUSCULAR | Status: DC

## 2023-07-23 MED ORDER — ONDANSETRON HCL 4 MG PO TABS: 4.0000 mg | ORAL_TABLET | Freq: Four times a day (QID) | ORAL | Status: DC | PRN

## 2023-07-23 MED ORDER — HYDRALAZINE HCL 100 MG PO TABS: 100.0000 mg | ORAL_TABLET | Freq: Three times a day (TID) | ORAL | Status: DC

## 2023-07-23 MED ORDER — AMLODIPINE BESYLATE 5 MG PO TABS
10.0000 mg | ORAL_TABLET | Freq: Every day | ORAL | Status: DC
  Administered 2023-07-24 – 2023-07-25 (×2): 10 mg via ORAL
  Filled 2023-07-23 (×2): qty 2

## 2023-07-23 MED ORDER — ROSUVASTATIN CALCIUM 5 MG PO TABS
5.0000 mg | ORAL_TABLET | Freq: Every day | ORAL | Status: DC
  Administered 2023-07-23 – 2023-07-24 (×2): 5 mg via ORAL
  Filled 2023-07-23 (×2): qty 1

## 2023-07-23 MED ORDER — INSULIN ASPART 100 UNIT/ML IJ SOLN
0.0000 [IU] | Freq: Every day | INTRAMUSCULAR | Status: DC
  Filled 2023-07-23: qty 1

## 2023-07-23 MED ORDER — CLOPIDOGREL BISULFATE 75 MG PO TABS
75.0000 mg | ORAL_TABLET | Freq: Every day | ORAL | Status: DC
  Administered 2023-07-24 – 2023-07-25 (×2): 75 mg via ORAL
  Filled 2023-07-23 (×2): qty 1

## 2023-07-23 MED ORDER — SODIUM CHLORIDE 0.9 % IV SOLN
2.0000 g | INTRAVENOUS | Status: DC
  Administered 2023-07-24: 2 g via INTRAVENOUS
  Filled 2023-07-23 (×2): qty 20

## 2023-07-23 MED ORDER — SENNOSIDES-DOCUSATE SODIUM 8.6-50 MG PO TABS: 1.0000 | ORAL_TABLET | Freq: Every evening | ORAL | Status: DC | PRN

## 2023-07-23 MED ORDER — HEPARIN SODIUM (PORCINE) 5000 UNIT/ML IJ SOLN
5000.0000 [IU] | Freq: Three times a day (TID) | INTRAMUSCULAR | Status: DC
Start: 2023-07-23 — End: 2023-07-25
  Administered 2023-07-23 – 2023-07-25 (×6): 5000 [IU] via SUBCUTANEOUS
  Filled 2023-07-23 (×6): qty 1

## 2023-07-23 MED ORDER — ONDANSETRON HCL 4 MG/2ML IJ SOLN: 4.0000 mg | Freq: Four times a day (QID) | INTRAMUSCULAR | Status: DC | PRN

## 2023-07-23 MED ORDER — IRBESARTAN 150 MG PO TABS
150.0000 mg | ORAL_TABLET | Freq: Every day | ORAL | Status: DC
  Administered 2023-07-24: 150 mg via ORAL
  Filled 2023-07-23 (×2): qty 1

## 2023-07-23 MED ORDER — ACETAMINOPHEN 325 MG PO TABS: 650.0000 mg | ORAL_TABLET | Freq: Four times a day (QID) | ORAL | Status: DC | PRN

## 2023-07-23 MED ORDER — CIPROFLOXACIN IN D5W 400 MG/200ML IV SOLN
400.0000 mg | Freq: Once | INTRAVENOUS | Status: AC
Start: 2023-07-23 — End: 2023-07-23
  Administered 2023-07-23: 400 mg via INTRAVENOUS
  Filled 2023-07-23: qty 200

## 2023-07-23 MED ORDER — LEVOTHYROXINE SODIUM 50 MCG PO TABS
75.0000 ug | ORAL_TABLET | Freq: Every day | ORAL | Status: DC
  Administered 2023-07-24 – 2023-07-25 (×2): 75 ug via ORAL
  Filled 2023-07-23 (×2): qty 1

## 2023-07-23 MED ORDER — TAMSULOSIN HCL 0.4 MG PO CAPS
0.4000 mg | ORAL_CAPSULE | Freq: Every day | ORAL | Status: DC
  Administered 2023-07-23 – 2023-07-25 (×3): 0.4 mg via ORAL
  Filled 2023-07-23 (×3): qty 1

## 2023-07-23 MED ORDER — HYDRALAZINE HCL 20 MG/ML IJ SOLN
20.0000 mg | Freq: Once | INTRAMUSCULAR | Status: DC
  Filled 2023-07-23: qty 1

## 2023-07-23 MED ORDER — HYDRALAZINE HCL 20 MG/ML IJ SOLN: 40.0000 mg | Freq: Three times a day (TID) | INTRAMUSCULAR | Status: DC

## 2023-07-23 MED ORDER — PAROXETINE HCL 20 MG PO TABS
40.0000 mg | ORAL_TABLET | Freq: Every evening | ORAL | Status: DC
  Administered 2023-07-23 – 2023-07-24 (×2): 40 mg via ORAL
  Filled 2023-07-23 (×3): qty 2

## 2023-07-23 MED ORDER — HYDRALAZINE HCL 20 MG/ML IJ SOLN
5.0000 mg | Freq: Four times a day (QID) | INTRAMUSCULAR | Status: DC | PRN
  Administered 2023-07-24: 5 mg via INTRAVENOUS
  Filled 2023-07-23 (×2): qty 1

## 2023-07-23 MED ORDER — HYDRALAZINE HCL 20 MG/ML IJ SOLN
10.0000 mg | Freq: Once | INTRAMUSCULAR | Status: AC
  Administered 2023-07-23: 10 mg via INTRAVENOUS
  Filled 2023-07-23: qty 1

## 2023-07-23 NOTE — H&P (Addendum)
History and Physical   Mary [name] NUU:[id number] DOB: 1935/05/07 DOA: 07/23/2023  PCP: Marisue Ivan, MD  Outpatient Specialists: Dr. Lonna Cobb, urology Patient coming from: home  I have personally briefly reviewed patient's old medical records in [facility] EMR.  Chief Concern:   HPI: [name] is an 88 year old female with history of hypertension, hypothyroid, CAD on dual antiplatelet therapy, non-insulin-dependent diabetes mellitus type 2, who presents ED for chief concerns of dizziness and left flank pain.  Vitals in the ED showed temperature 98.8, respiration rate 20, heart rate 69, blood pressure 247/130 and improved to 208/65, SpO2 of 91% on room air.  Serum sodium is 139, potassium 2.7, chloride 101, bicarb 29, BUN of 13, serum creatinine 0.77, EGFR greater than 60, nonfasting blood glucose 127, WBC 6.9, hemoglobin 13.8, platelets of 132.  UA was positive for small leukocytes and positive for nitrates.  ED treatment: Hydralazine 10 mg IV one-time dose, followed by hydralazine 20 mg IV one-time dose at 1330, Flomax 0.4 mg, Cipro 400 mg IV one-time dose. ----------------------------- At bedside, she is able to tell me her name, age, current location, current year.  She endorses abdominal pain, that started yesterday with burning sensation. She denies blood in her urine.   She denies diarrhea, blood in her stool.  She denies chest pain, shortness of breath, diarrhea, blood in her stool, blood in your urine.  She denies syncope or loss of consciousness.  She endorses a headache that started over the last 2 days. Patient states she took some of her her blood pressure medication over the last 2 days however she does not take blood pressure medications regularly.  She reports she does not take all her blood pressure medications at home because one of them makes her urinate a lot.  I explained to her that blood pressure medication is most effective when taken regularly  every day.  She reports to me that she did not know this.  Social history: She lives at home with her daughter. Her daughter has cerebral palsy and patient is the primary care giver for her daughter. She denies tobacco, etoh, recreational drug use. She formerly worked in the [employer], and then [employer].   ROS: Constitutional: no weight change, no fever ENT/Mouth: no sore throat, no rhinorrhea Eyes: no eye pain, no vision changes Cardiovascular: no chest pain, no dyspnea,  no edema, no palpitations Respiratory: no cough, no sputum, no wheezing Gastrointestinal: no nausea, no vomiting, no diarrhea, no constipation Genitourinary: no urinary incontinence, + dysuria, no hematuria Musculoskeletal: no arthralgias, no myalgias Skin: no skin lesions, no pruritus, Neuro: + weakness, no loss of consciousness, no syncope Psych: no anxiety, no depression, + decrease appetite Heme/Lymph: no bruising, no bleeding  ED Course: Discussed with the EDP, patient requiring hospitalization for chief concerns of hypertensive urgency.  Assessment/Plan  Principal Problem:   Hypertensive urgency Active Problems:   UTI (urinary tract infection)   Hypothyroidism   Diabetes mellitus without complication (HCC)   HTN (hypertension)   HLD (hyperlipidemia)   Depression   IDA (iron deficiency anemia)   Coronary artery disease involving native coronary artery of native heart   Type 2 diabetes mellitus with diabetic retinopathy (HCC)   Type 2 diabetes mellitus with hyperlipidemia (HCC)   Assessment and Plan:  * Hypertensive urgency Suspect secondary to patient not understanding her medication regimen and the need for daily hypertensive medication use Resume home amlodipine 10 mg daily, irbesartan 150 mg daily, hydralazine 100 mg p.o.  3 times daily Hydralazine 5 mg IV every 6 hours as needed for SBP greater 175, 4 days ordered  UTI (urinary tract infection) Present on admission Ceftriaxone 2 g IV  daily starting on 07/24/2023, 4 doses ordered  Hypothyroidism Levothyroxine 75 mcg daily resumed  Depression Home paroxetine 40 mg every evening and trazodone 25-50 mg nightly were resumed on admission  HLD (hyperlipidemia) Home rosuvastatin 5 milligrams nightly resumed   HTN (hypertension) Home amlodipine 10 mg daily, irbesartan 150 mg daily, hydralazine 100 mg 3 times daily resumed on admission Hydrochlorothiazide 25 mg daily not resumed on admission as patient does not regularly take her home antihypertensive medication.  I would recommend resuming home medications slowly giving patient time to adjust  Diabetes mellitus without complication (HCC) Home metformin not resumed on admission Patient last had A1c of 6.2 on 02/03/2023 Insulin SSI with at bedtime coverage ordered  Pending complete med reconciliation.  AM team to resume home medication as appropriate  Chart reviewed.   DVT prophylaxis: Heparin 5000 units subcutaneous every 8 hours Code Status: DNR/DNI Diet: Heart healthy/carb modified Family Communication: No.  Patient states that her only family member is her daughter who has cerebral palsy and requires assistance with daily living activities Disposition Plan: Pending clinical course Consults called: None at this time Admission status: PCU, inpatient  Past Medical History:  Diagnosis Date   Acid reflux    Anemia    Arthritis    Asthma    Atherosclerosis of abdominal aorta (HCC)    B12 deficiency    Clostridium difficile infection    H/O   Coronary artery disease    DDD (degenerative disc disease), lumbar    Depression    Diabetes (HCC)    type 2   Diabetic retinopathy (HCC)    Dysrhythmia    Heart murmur    HLD (hyperlipidemia)    HTN (hypertension)    Hypotension    when get up too quickly   Hypothyroidism    Pneumonia    PONV (postoperative nausea and vomiting)    Pulmonary nodule    right middle lobe on CT scan   Pure hypercholesterolemia    RA  (rheumatoid arthritis) (HCC)    Sleep apnea    Spinal stenosis    Urinary urgency    Past Surgical History:  Procedure Laterality Date   ABDOMINAL HYSTERECTOMY     CATARACT EXTRACTION, BILATERAL     COLONOSCOPY N/A 07/29/2021   Procedure: COLONOSCOPY;  Surgeon: Toledo, Boykin Nearing, MD;  Location: [facility];  Service: Gastroenterology;  Laterality: N/A;   CYSTOSCOPY     ENDARTERECTOMY Right 12/02/2021   Procedure: ENDARTERECTOMY CAROTID;  Surgeon: Annice Needy, MD;  Location: [facility] ORS;  Service: Vascular;  Laterality: Right;   ESOPHAGOGASTRODUODENOSCOPY N/A 07/29/2021   Procedure: ESOPHAGOGASTRODUODENOSCOPY (EGD);  Surgeon: Toledo, Boykin Nearing, MD;  Location: [facility] END[facility];  Service: Gastroenterology;  Laterality: N/A;  DM   HIP ARTHROPLASTY Right 09/24/2015   Procedure: ARTHROPLASTY BIPOLAR HIP (HEMIARTHROPLASTY);  Surgeon: Christena Flake, MD;  Location: [facility] ORS;  Service: Orthopedics;  Laterality: Right;   HIP ARTHROPLASTY Left 09/29/2018   Procedure: ARTHROPLASTY BIPOLAR HIP (HEMIARTHROPLASTY) LEFT;  Surgeon: Christena Flake, MD;  Location: [facility] ORS;  Service: Orthopedics;  Laterality: Left;   KNEE ARTHROSCOPY W/ AUTOGENOUS CARTILAGE IMPLANTATION (ACI) PROCEDURE     TOTAL KNEE ARTHROPLASTY Left 12/01/2017   Procedure: TOTAL KNEE ARTHROPLASTY;  Surgeon: Christena Flake, MD;  Location: [facility] ORS;  Service: Orthopedics;  Laterality: Left;  TOTAL KNEE ARTHROPLASTY Right 2014   VARICOSE VEIN SURGERY Left    leg   Social History:  reports that she has never smoked. She has never used smokeless tobacco. She reports that she does not drink alcohol and does not use drugs.  Allergies  Allergen Reactions   Cephalexin Hives   Nitrofurantoin     Other reaction(s): Other (See Comments) Other Reaction: measles-like lesions   Sulfa Antibiotics Hives   Atorvastatin Other (See Comments)    Muscle spasms/ muscle pain   Family History  Problem Relation Age of Onset   Kidney disease Brother         also nephew   Heart disease Mother    Heart disease Father    Prostate cancer Neg Hx    Bladder Cancer Neg Hx    Breast cancer Neg Hx    Kidney cancer Neg Hx    Family history: Family history reviewed and not pertinent  Prior to Admission medications   Medication Sig Start Date End Date Taking? Authorizing Provider  tamsulosin (FLOMAX) 0.4 MG CAPS capsule Take 1 capsule (0.4 mg total) by mouth daily. 07/23/23 08/22/23 Yes Pilar Jarvis, MD  acetaminophen (TYLENOL) 500 MG tablet Take 500 mg by mouth every 6 (six) hours as needed.    [provider]  amLODipine (NORVASC) 10 MG tablet Take 1 tablet (10 mg total) by mouth daily. 09/20/21   Sunnie Nielsen, DO  aspirin EC 81 MG EC tablet Take 1 tablet (81 mg total) by mouth daily. Swallow whole. 09/20/21   Sunnie Nielsen, DO  azelastine (OPTIVAR) 0.05 % ophthalmic solution Place 1 drop into both eyes daily as needed (allergies).    [provider]  busPIRone (BUSPAR) 10 MG tablet Take 20 mg by mouth 2 (two) times daily.  11/25/16   [provider]  clopidogrel (PLAVIX) 75 MG tablet Take 1 tablet (75 mg total) by mouth daily at 6 (six) AM. 06/27/23   Georgiana Spinner, NP  doxycycline (VIBRA-TABS) 100 MG tablet Take 1 tablet (100 mg total) by mouth 2 (two) times daily. 10/07/22   Stoioff, Verna Czech, MD  ezetimibe (ZETIA) 10 MG tablet Take 10 mg by mouth at bedtime. 07/17/19   [provider]  furosemide (LASIX) 20 MG tablet Take 1 tablet (20 mg total) by mouth daily. 09/20/21   Sunnie Nielsen, DO  hydrALAZINE (APRESOLINE) 100 MG tablet Take 1 tablet (100 mg total) by mouth 3 (three) times daily. 11/18/19   Tresa Moore, MD  hydrochlorothiazide (HYDRODIURIL) 25 MG tablet Take 1 tablet (25 mg total) by mouth daily. 11/18/19   Sreenath, Jonelle Sports, MD  Iron-Vitamin C (VITRON-C) 65-125 MG TABS Take 1 tablet by mouth daily. Patient taking differently: Take 1 tablet by mouth every other day. 02/24/21   Rickard Patience, MD   levothyroxine (SYNTHROID, LEVOTHROID) 75 MCG tablet Take 75 mcg by mouth daily.     [provider]  metFORMIN (GLUCOPHAGE-XR) 500 MG 24 hr tablet Take 500 mg by mouth daily.    [provider]  omeprazole (PRILOSEC) 40 MG capsule Take 40 mg by mouth daily.     [provider]  oxyCODONE-acetaminophen (PERCOCET/ROXICET) 5-325 MG tablet Take 1-2 tablets by mouth every 4 (four) hours as needed for moderate pain. 12/03/21   Georgiana Spinner, NP  PARoxetine (PAXIL) 40 MG tablet Take 40 mg by mouth every evening. 09/28/16   [provider]  phenazopyridine (PYRIDIUM) 200 MG tablet Take 1 tablet (200 mg total)  by mouth 3 (three) times daily as needed for pain. Do not take for longer than 2 days consecutively. 02/10/23   Vaillancourt, Lelon Mast, PA-C  rosuvastatin (CRESTOR) 5 MG tablet Take 1 tablet (5 mg total) by mouth daily. 04/22/22   Georgiana Spinner, NP  traZODone (DESYREL) 50 MG tablet Take 1 tablet (50 mg total) by mouth at bedtime as needed for sleep. Patient taking differently: Take 25 mg by mouth at bedtime as needed for sleep. 09/19/21   Sunnie Nielsen, DO  valsartan (DIOVAN) 160 MG tablet Take 160 mg by mouth daily.    [provider]  Vibegron (GEMTESA) 75 MG TABS Take 1 tablet (75 mg total) by mouth daily. 09/29/22   Stoioff, Verna Czech, MD  Vibegron (GEMTESA) 75 MG TABS Take 1 tablet (75 mg total) by mouth daily. 11/04/22   Stoioff, Verna Czech, MD  vitamin B-12 (CYANOCOBALAMIN) 500 MCG tablet Take 500 mcg by mouth every other day.    [provider]   Physical Exam: Vitals:   07/23/23 1400 07/23/23 1415 07/23/23 1441 07/23/23 1623  BP: (!) 223/70 (!) 193/63 (!) 208/72 (!) 171/67  Pulse: (!) 51 (!) 55 71 66  Resp: (!) 8   17  Temp:   98.2 F (36.8 C) 98 F (36.7 C)  TempSrc:    Oral  SpO2:  92% 93% 91%  Weight:      Height:       Constitutional: appears age-appropriate, frail, NAD, calm Eyes: PERRL, lids and conjunctivae normal ENMT:  Mucous membranes are moist. Posterior pharynx clear of any exudate or lesions. Age-appropriate dentition. Hearing appropriate Neck: normal, supple, no masses, no thyromegaly Respiratory: clear to auscultation bilaterally, no wheezing, no crackles. Normal respiratory effort. No accessory muscle use.  Cardiovascular: Regular rate and rhythm, no murmurs / rubs / gallops. No extremity edema. 2+ pedal pulses. No carotid bruits.  Abdomen: no tenderness, no masses palpated, no hepatosplenomegaly. Bowel sounds positive.  Musculoskeletal: no clubbing / cyanosis. No joint deformity upper and lower extremities. Good ROM, no contractures, no atrophy. Normal muscle tone.  Skin: no rashes, lesions, ulcers. No induration Neurologic: Sensation intact. Strength 5/5 in all 4.  Psychiatric: Normal judgment and insight. Alert and oriented x 3.  Depressed mood.  Flat affect  EKG: independently reviewed, showing sinus bradycardia with rate of 55, QTc 428  Chest x-ray on Admission: I personally reviewed and I agree with radiologist reading as below.  MR BRAIN WO CONTRAST  Result Date: 07/23/2023 CLINICAL DATA:  Provided history: Neuro deficit, acute, stroke suspected. Additional history provided: Dizziness, hypertension, UTI. EXAM: MRI HEAD WITHOUT CONTRAST TECHNIQUE: Multiplanar, multiecho pulse sequences of the brain and surrounding structures were obtained without intravenous contrast. COMPARISON:  Head CT 07/23/2023.  Brain MRI 11/16/2019. FINDINGS: Brain: Mild generalized parenchymal volume loss. Chronic infarct within the right parietal white matter with ex vacuo dilatation of the posterior body and atrium of the right lateral ventricle. Adjacent chronic lacunar infarcts within the right frontoparietal white matter. Moderate multifocal T2 FLAIR hyperintense signal abnormality elsewhere within the cerebral white matter, nonspecific but compatible chronic small vessel ischemic disease. Prominent perivascular space  within the inferior left basal ganglia. Mild chronic small vessel ischemic changes within the pons. Small chronic infarcts within the bilateral cerebellar hemisphere. No cortical encephalomalacia is identified. There is no acute infarct. No evidence of an intracranial mass. No chronic intracranial blood products. No extra-axial fluid collection. No midline shift. Vascular: Maintained flow voids within the proximal large arterial vessels. Skull  and upper cervical spine: No focal worrisome marrow lesion. Incompletely assessed cervical spondylosis. Sinuses/Orbits: No mass or acute finding within the imaged orbits. Prior bilateral ocular lens replacement. No significant paranasal sinus disease. Other: Bilateral mastoid effusions. IMPRESSION: 1. No evidence of an acute intracranial abnormality. 2. Parenchymal atrophy, chronic small vessel ischemic disease and chronic infarcts as outlined and not significantly changed from the prior brain MRI 11/16/2019. 3. Bilateral mastoid effusions. Electronically Signed   By: Jackey Loge D.O.   On: 07/23/2023 12:38   CT Renal Stone Study  Result Date: 07/23/2023 CLINICAL DATA:  Left flank pain.  Nephrolithiasis. EXAM: CT ABDOMEN AND PELVIS WITHOUT CONTRAST TECHNIQUE: Multidetector CT imaging of the abdomen and pelvis was performed following the standard protocol without IV contrast. RADIATION DOSE REDUCTION: This exam was performed according to the departmental dose-optimization program which includes automated exposure control, adjustment of the mA and/or kV according to patient size and/or use of iterative reconstruction technique. COMPARISON:  07/07/2011 FINDINGS: Lower chest: No acute findings. Stable 5 mm right middle lobe pulmonary nodule, consistent with benign etiology Hepatobiliary: No mass visualized on this unenhanced exam. Gallbladder is unremarkable. No evidence of biliary ductal dilatation. Pancreas: No mass or inflammatory process visualized on this unenhanced  exam. Spleen:  Within normal limits in size. Adrenals/Urinary tract: Tiny right renal calculi and mild scarring again noted. Mild left renal pelvicaliectasis and ureterectasis is seen. No ureteral calculi are visualized, however, distal ureters and bladder are obscured by severe artifact from bilateral hip prostheses. Stomach/Bowel: No evidence of obstruction, inflammatory process, or abnormal fluid collections. Normal appendix visualized. Diverticulosis is seen mainly involving the sigmoid colon, however there is no evidence of diverticulitis. Vascular/Lymphatic: No pathologically enlarged lymph nodes identified. No evidence of abdominal aortic aneurysm. Reproductive: Prior hysterectomy. Limited visualization noted due to severe artifact from bilateral hip prostheses. Other:  None. Musculoskeletal:  No suspicious bone lesions identified. IMPRESSION: Mild left renal hydroureteronephrosis. No ureteral calculi are visualized, however, distal ureter and bladder are obscured by severe artifact from bilateral hip prostheses. Tiny nonobstructing right renal calculi. Colonic diverticulosis, without radiographic evidence of diverticulitis. Electronically Signed   By: Danae Orleans M.D.   On: 07/23/2023 11:57   CT Head Wo Contrast  Result Date: 07/23/2023 CLINICAL DATA:  Acute stroke suspected.  Neuro deficit. EXAM: CT HEAD WITHOUT CONTRAST TECHNIQUE: Contiguous axial images were obtained from the base of the skull through the vertex without intravenous contrast. RADIATION DOSE REDUCTION: This exam was performed according to the departmental dose-optimization program which includes automated exposure control, adjustment of the mA and/or kV according to patient size and/or use of iterative reconstruction technique. COMPARISON:  09/17/2021 FINDINGS: Brain: No evidence of acute infarction, hemorrhage, hydrocephalus, extra-axial collection or mass lesion/mass effect. Chronic infarct in the right periatrial white matter with  thinning. Generalized cerebral volume loss without specific pattern. Vascular: No hyperdense vessel or unexpected calcification. Skull: Normal. Negative for fracture or focal lesion. Sinuses/Orbits: Negative IMPRESSION: No acute finding or change from 2023. Electronically Signed   By: Tiburcio Pea M.D.   On: 07/23/2023 10:58    Labs on Admission: I have personally reviewed following labs CBC: Recent Labs  Lab 07/23/23 1058  WBC 6.9  HGB 13.8  HCT 43.5  MCV 98.6  PLT 132*   Basic Metabolic Panel: Recent Labs  Lab 07/23/23 1058  NA 139  K 3.7  CL 101  CO2 29  GLUCOSE 127*  BUN 13  CREATININE 0.77  CALCIUM 9.5   GFR: Estimated Creatinine  Clearance: 51.6 mL/min (by C-G formula based on SCr of 0.77 mg/dL).  Urine analysis:    Component Value Date/Time   COLORURINE AMBER (A) 07/23/2023 1058   APPEARANCEUR CLEAR (A) 07/23/2023 1058   APPEARANCEUR Hazy (A) 05/09/2023 1300   LABSPEC 1.011 07/23/2023 1058   LABSPEC 1.005 12/29/2013 0119   PHURINE 6.0 07/23/2023 1058   GLUCOSEU NEGATIVE 07/23/2023 1058   GLUCOSEU Negative 12/29/2013 0119   HGBUR MODERATE (A) 07/23/2023 1058   BILIRUBINUR NEGATIVE 07/23/2023 1058   BILIRUBINUR Negative 05/09/2023 1300   BILIRUBINUR Negative 12/29/2013 0119   KETONESUR NEGATIVE 07/23/2023 1058   PROTEINUR NEGATIVE 07/23/2023 1058   NITRITE POSITIVE (A) 07/23/2023 1058   LEUKOCYTESUR SMALL (A) 07/23/2023 1058   LEUKOCYTESUR 1+ 12/29/2013 0119   This document was prepared using Dragon Voice Recognition software and may include unintentional dictation errors.  Dr. Sedalia Muta [facility]  If 7PM-7AM, please contact overnight-coverage provider If 7AM-7PM, please contact day attending provider [email or web address]  07/23/2023, 5:14 PM

## 2023-07-23 NOTE — ED Notes (Signed)
Advised nurse that patient has ready bed 

## 2023-07-23 NOTE — Assessment & Plan Note (Signed)
Present on admission Ceftriaxone 2 g IV daily starting on 07/24/2023, 4 doses ordered Follow up urine cultures

## 2023-07-23 NOTE — Assessment & Plan Note (Addendum)
Suspect secondary to patient not understanding her medication regimen and the need for daily hypertensive medication use Resume home amlodipine 10 mg daily, irbesartan 150 mg daily, hydralazine 100 mg p.o. 3 times daily Hydralazine 5 mg IV every 6 hours as needed for SBP greater 175, 4 days ordered

## 2023-07-23 NOTE — ED Notes (Signed)
Pt up to bathroom at this time. Gait steady with use of a walker. Standby assist provided

## 2023-07-23 NOTE — Plan of Care (Signed)
  Problem: Education: Goal: Knowledge of General Education information will improve Description: Including pain rating scale, medication(s)/side effects and non-pharmacologic comfort measures Outcome: Progressing   Problem: Health Behavior/Discharge Planning: Goal: Ability to manage health-related needs will improve Outcome: Progressing   Problem: Clinical Measurements: Goal: Ability to maintain clinical measurements within normal limits will improve Outcome: Progressing Goal: Will remain free from infection Outcome: Progressing Goal: Diagnostic test results will improve Outcome: Progressing   Problem: Nutrition: Goal: Adequate nutrition will be maintained Outcome: Progressing   Problem: Skin Integrity: Goal: Risk for impaired skin integrity will decrease Outcome: Progressing   Problem: Safety: Goal: Ability to remain free from injury will improve Outcome: Progressing

## 2023-07-23 NOTE — ED Triage Notes (Signed)
   First nurse note: Brought over by [facility] for Dizziness and UTI. KC reports blood pressure of 227/110 and sent by MD for further evaluation.

## 2023-07-23 NOTE — Assessment & Plan Note (Signed)
-   Levothyroxine 75 mcg daily resumed 

## 2023-07-23 NOTE — Assessment & Plan Note (Signed)
Home rosuvastatin 5 milligrams nightly resumed

## 2023-07-23 NOTE — Assessment & Plan Note (Addendum)
Home paroxetine 40 mg every evening and trazodone 25-50 mg nightly were resumed on admission

## 2023-07-23 NOTE — Assessment & Plan Note (Addendum)
Home amlodipine 10 mg daily, irbesartan 150 mg daily, hydralazine 100 mg 3 times daily resumed on admission Hydrochlorothiazide 25 mg daily not resumed on admission as patient does not regularly take her home antihypertensive medication.  I would recommend resuming home medications slowly giving patient time to adjust

## 2023-07-23 NOTE — Assessment & Plan Note (Signed)
Home metformin not resumed on admission Patient last had A1c of 6.2 on 02/03/2023 Insulin SSI with at bedtime coverage ordered

## 2023-07-23 NOTE — ED Provider Notes (Addendum)
[facility] Provider Note    Event Date/Time   First MD Initiated Contact with Patient 07/23/23 1034     (approximate)   History   Dizziness   HPI  Mary [name] is a 88 y.o. female   Past medical history of hypertension, hyperlipidemia, hypothyroid, CAD on dual antiplatelet aspirin and Plavix therapy here with dysuria and hypertension sent in from urgent care clinic.  Poorly having dysuria and left-sided abdominal pain for the last 1 month, difficulty holding in her urine, subjective fever evaluated in clinic and found to be markedly hypertensive 250s systolic and sent to the emergency department for further evaluation.  Noted to be dizzy at the time.   When I speak with the patient, she does indeed describe dysuria for the past several weeks leading up to 1 month with subjective fever earlier this month which completely resolved.  She does have some left flank pain rating to the left lower quadrant for the last 2 to 3 days but no history of kidney stones.  Bowel movements have been normal, no GI bleeding, no nausea or vomiting.  She has been compliant with her medications overall but did not take her hydralazine this morning.  She is normally ambulatory with a walker at baseline does experience some intermittent dizziness though this is worse than normal over the past several days.  Denies any other focal neurologic changes like motor or sensory deficits.   Independent Historian contributed to assessment above: Her sister is at bedside to corroborate information past medical history as above  External Medical Documents Reviewed: Urgent care note from earlier today reporting the above, as well as a urine culture from 05/09/2023 showing Klebsiella urinary tract infection susceptible to everything except for ampicillin.      Physical Exam   Triage Vital Signs: ED Triage Vitals  Encounter Vitals Group     BP 07/23/23 1029 (!) 252/87     Systolic BP  Percentile --      Diastolic BP Percentile --      Pulse Rate 07/23/23 1029 (!) 57     Resp 07/23/23 1029 20     Temp 07/23/23 1029 98.8 F (37.1 C)     Temp Source 07/23/23 1029 Oral     SpO2 07/23/23 1029 91 %     Weight 07/23/23 1030 161 lb (73 kg)     Height 07/23/23 1030 5\' 6"  (1.676 m)     Head Circumference --      Peak Flow --      Pain Score 07/23/23 1030 0     Pain Loc --      Pain Education --      Exclude from Growth Chart --     Most recent vital signs: Vitals:   07/23/23 1230 07/23/23 1237  BP:  (!) 232/80  Pulse: 73 60  Resp: 19 18  Temp:    SpO2: 91% 93%    General: Awake, no distress.  CV:  Good peripheral perfusion.  Resp:  Normal effort.  Abd:  No distention.  Other:  Pleasant woman in no acute distress.  She has some mild left flank pain as well as left lower quadrant pain to palpation.  The remainder of her abdominal exam is soft and benign without rigidity or guarding.  Skin appears warm well-perfused.  She is ambulating with walker no obvious ataxia motor or sensory deficits dysarthria or facial asymmetry though she does subjectively state that she feels more imbalanced  than she normally would.   ED Results / Procedures / Treatments   Labs (all labs ordered are listed, but only abnormal results are displayed) Labs Reviewed  BASIC METABOLIC PANEL - Abnormal; Notable for the following components:      Result Value   Glucose, Bld 127 (*)    All other components within normal limits  CBC - Abnormal; Notable for the following components:   Platelets 132 (*)    All other components within normal limits  URINALYSIS, ROUTINE W REFLEX MICROSCOPIC - Abnormal; Notable for the following components:   Color, Urine AMBER (*)    APPearance CLEAR (*)    Hgb urine dipstick MODERATE (*)    Nitrite POSITIVE (*)    Leukocytes,Ua SMALL (*)    Bacteria, UA MANY (*)    All other components within normal limits  URINE CULTURE     I ordered and reviewed the  above labs they are notable for nitrite positive bacteriuria, otherwise cell counts electrolytes largely unremarkable  EKG  ED ECG REPORT I, Pilar Jarvis, the attending physician, personally viewed and interpreted this ECG.   Date: 07/23/2023  EKG Time: 1034  Rate: 55  Rhythm: sinus bradycardia  Axis: nl  Intervals:none  ST&T Change: no stemi    RADIOLOGY I independently reviewed and interpreted CT scan of the head and I see no obvious bleeding or midline shift I also reviewed radiologist's formal read.   PROCEDURES:  Critical Care performed: No  Procedures   MEDICATIONS ORDERED IN ED: Medications  ciprofloxacin (CIPRO) IVPB 400 mg (400 mg Intravenous New Bag/Given 07/23/23 1235)  hydrALAZINE (APRESOLINE) injection 20 mg (has no administration in time range)  tamsulosin (FLOMAX) capsule 0.4 mg (has no administration in time range)  hydrALAZINE (APRESOLINE) injection 10 mg (10 mg Intravenous Given 07/23/23 1146)     IMPRESSION / MDM / ASSESSMENT AND PLAN / ED COURSE  I reviewed the triage vital signs and the nursing notes.                                Patient's presentation is most consistent with acute presentation with potential threat to life or bodily function.  Differential diagnosis includes, but is not limited to, renal colic, kidney stone, obstructive uropathy, urinary tract infection, pyelonephritis, hypertensive urgency, stroke   The patient is on the cardiac monitor to evaluate for evidence of arrhythmia and/or significant heart rate changes.  MDM:    In terms of her dysuria and flank pain I suspect urinary tract infection and indeed her urinalysis does appear infected.  Given her cephalosporin/sulfa/Macrobid allergy will start her on ciprofloxacin for urinary tract infection and sent for culture.  CT renal protocol to look for stones given her flank pain showed hydronephrosis and the remainder of the ureters were obscured due to her hip prosthesis, we  will start her on Flomax for suspected renal colic.  She is markedly hypertensive but did not take her hydralazine this morning.  Will give her some hydralazine in the emergency department recheck blood pressure.  Given her sense of worsening imbalance in the setting of marked hypertension we will get an MRI of the brain assess for stroke though not activating stroke code due to mild symptoms and outside of the thrombolytic window.Marland Kitchen    -- MRI negative for stroke.  Remains markedly hypertensive still in the 230s systolic after IV hydralazine.  Will redosed with 20 mg.  Admission for  hypertensive crisis.     FINAL CLINICAL IMPRESSION(S) / ED DIAGNOSES   Final diagnoses:  Left flank pain  Urinary tract infection without hematuria, site unspecified  Uncontrolled hypertension  Dizziness  Hypertensive crisis  Hydronephrosis of left kidney     Rx / DC Orders   ED Discharge Orders          Ordered    tamsulosin (FLOMAX) 0.4 MG CAPS capsule  Daily        07/23/23 1204             Note:  This document was prepared using Dragon voice recognition software and may include unintentional dictation errors.    Pilar Jarvis, MD 07/23/23 1205    Pilar Jarvis, MD 07/23/23 1323

## 2023-07-23 NOTE — Hospital Course (Signed)
[name] is an 88 year old female with history of hypertension, hypothyroid, CAD on dual antiplatelet therapy, non-insulin-dependent diabetes mellitus type 2, who presents ED for chief concerns of dizziness and left flank pain.  Vitals in the ED showed temperature 98.8, respiration rate 20, heart rate 69, blood pressure 247/130 and improved to 208/65, SpO2 of 91% on room air.  Serum sodium is 139, potassium 2.7, chloride 101, bicarb 29, BUN of 13, serum creatinine 0.77, EGFR greater than 60, nonfasting blood glucose 127, WBC 6.9, hemoglobin 13.8, platelets of 132.  UA was positive for small leukocytes and positive for nitrates. CT renal stone studies with mild left renal hydroureteronephrosis, no nephrolithiasis on left, tiny nonobstructing right renal calculi.  ED treatment: Hydralazine 10 mg IV one-time dose, followed by hydralazine 20 mg IV one-time dose at 1330, Flomax 0.4 mg, Cipro 400 mg IV one-time dose.  Started on ceftriaxone.  12/1: Vitals with elevated blood pressure at 222/83, labs stable except mild hypokalemia which is being repleted, pending urine culture.  12/2: Blood pressure remained elevated, increasing the dose of irbesartan to 300 mg daily. Urine cultures growing Klebsiella pneumonia-shows only resistant to ampicillin and partial to nitrofurantoin.  Patient was taking home hydralazine which was prescribed as 100 mg 3 times daily, at a lower dose of 50 mg 3 times daily she was instructed to go back to 100 mg 3 times daily.  She was also not taking her clonidine very regularly.  Physical therapist evaluated her recommended home health services which were ordered.  Patient will get benefit from a pillbox to avoid any confusion and taking medications regularly to avoid further hospitalizations with hypertensive urgency.  Patient received ceftriaxone while in the hospital and discharged on Ceftin for 4 more days to complete the course for UTI.  She will continue on current  medications and need to have a close follow-up with her provider for further management.

## 2023-07-23 NOTE — ED Notes (Signed)
Pt to MRI

## 2023-07-24 DIAGNOSIS — I160 Hypertensive urgency: Secondary | ICD-10-CM | POA: Diagnosis not present

## 2023-07-24 DIAGNOSIS — I10 Essential (primary) hypertension: Secondary | ICD-10-CM

## 2023-07-24 DIAGNOSIS — N390 Urinary tract infection, site not specified: Secondary | ICD-10-CM | POA: Diagnosis not present

## 2023-07-24 DIAGNOSIS — E039 Hypothyroidism, unspecified: Secondary | ICD-10-CM

## 2023-07-24 DIAGNOSIS — E119 Type 2 diabetes mellitus without complications: Secondary | ICD-10-CM

## 2023-07-24 DIAGNOSIS — E876 Hypokalemia: Secondary | ICD-10-CM

## 2023-07-24 LAB — BASIC METABOLIC PANEL
Anion gap: 12 (ref 5–15)
BUN: 11 mg/dL (ref 8–23)
CO2: 26 mmol/L (ref 22–32)
Calcium: 9.2 mg/dL (ref 8.9–10.3)
Chloride: 101 mmol/L (ref 98–111)
Creatinine, Ser: 0.66 mg/dL (ref 0.44–1.00)
GFR, Estimated: >60 mL/min (ref >60)
Glucose, Bld: 125 mg/dL — ABNORMAL HIGH (ref 70–99)
Potassium: 3.3 mmol/L — ABNORMAL LOW (ref 3.5–5.1)
Sodium: 139 mmol/L (ref 135–145)

## 2023-07-24 LAB — CBC
HCT: 40.5 % (ref 36.0–46.0)
Hemoglobin: 13 g/dL (ref 12.0–15.0)
MCH: 31.5 pg (ref 26.0–34.0)
MCHC: 32.1 g/dL (ref 30.0–36.0)
MCV: 98.1 fL (ref 80.0–100.0)
Platelets: 125 10*3/uL — ABNORMAL LOW (ref 150–400)
RBC: 4.13 MIL/uL (ref 3.87–5.11)
RDW: 13.2 % (ref 11.5–15.5)
WBC: 6.4 10*3/uL (ref 4.0–10.5)
nRBC: 0 % (ref 0.0–0.2)

## 2023-07-24 LAB — GLUCOSE, CAPILLARY
Glucose-Capillary: 109 mg/dL — ABNORMAL HIGH (ref 70–99)
Glucose-Capillary: 164 mg/dL — ABNORMAL HIGH (ref 70–99)
Glucose-Capillary: 135 mg/dL — ABNORMAL HIGH (ref 70–99)
Glucose-Capillary: 157 mg/dL — ABNORMAL HIGH (ref 70–99)

## 2023-07-24 LAB — HEMOGLOBIN A1C
Hgb A1c MFr Bld: 6.1 % — ABNORMAL HIGH (ref 4.8–5.6)
Mean Plasma Glucose: 128.37 mg/dL

## 2023-07-24 LAB — MAGNESIUM: Magnesium: 1.9 mg/dL (ref 1.7–2.4)

## 2023-07-24 MED ORDER — POTASSIUM CHLORIDE CRYS ER 20 MEQ PO TBCR
40.0000 meq | EXTENDED_RELEASE_TABLET | Freq: Once | ORAL | Status: AC
  Administered 2023-07-24: 40 meq via ORAL
  Filled 2023-07-24: qty 2

## 2023-07-24 MED ORDER — ACETAMINOPHEN 500 MG PO TABS
1000.0000 mg | ORAL_TABLET | Freq: Once | ORAL | Status: AC
  Administered 2023-07-24: 1000 mg via ORAL
  Filled 2023-07-24: qty 2

## 2023-07-24 MED ORDER — METHOCARBAMOL 500 MG PO TABS
500.0000 mg | ORAL_TABLET | Freq: Once | ORAL | Status: AC
  Administered 2023-07-24: 500 mg via ORAL
  Filled 2023-07-24: qty 1

## 2023-07-24 MED ORDER — CALCIUM CARBONATE ANTACID 500 MG PO CHEW
1.0000 | CHEWABLE_TABLET | Freq: Three times a day (TID) | ORAL | Status: DC | PRN
  Administered 2023-07-24: 200 mg via ORAL
  Filled 2023-07-24: qty 1

## 2023-07-24 NOTE — Progress Notes (Signed)
Progress Note   Patient: Mary [name] ION:[id number] DOB: 24-Oct-1934 DOA: 07/23/2023     1 DOS: the patient was seen and examined on 07/24/2023   Brief hospital course: [name] is an 88 year old female with history of hypertension, hypothyroid, CAD on dual antiplatelet therapy, non-insulin-dependent diabetes mellitus type 2, who presents ED for chief concerns of dizziness and left flank pain.  Vitals in the ED showed temperature 98.8, respiration rate 20, heart rate 69, blood pressure 247/130 and improved to 208/65, SpO2 of 91% on room air.  Serum sodium is 139, potassium 2.7, chloride 101, bicarb 29, BUN of 13, serum creatinine 0.77, EGFR greater than 60, nonfasting blood glucose 127, WBC 6.9, hemoglobin 13.8, platelets of 132.  UA was positive for small leukocytes and positive for nitrates. CT renal stone studies with mild left renal hydroureteronephrosis, no nephrolithiasis on left, tiny nonobstructing right renal calculi.  ED treatment: Hydralazine 10 mg IV one-time dose, followed by hydralazine 20 mg IV one-time dose at 1330, Flomax 0.4 mg, Cipro 400 mg IV one-time dose.  Started on ceftriaxone.  12/1: Vitals with elevated blood pressure at 222/83, labs stable except mild hypokalemia which is being repleted, pending urine culture.  Assessment and Plan: * Hypertensive urgency Blood pressure slowly improving, remained little elevated Suspect secondary to patient not understanding her medication regimen and the need for daily hypertensive medication use Resume home amlodipine 10 mg daily, irbesartan 150 mg daily, hydralazine 100 mg p.o. 3 times daily Hydralazine 5 mg IV every 6 hours as needed for SBP greater 175, 4 days ordered  HTN (hypertension) Home amlodipine 10 mg daily, irbesartan 150 mg daily, hydralazine 100 mg 3 times daily resumed on admission -Resume home HCTZ  UTI (urinary tract infection) Present on admission Ceftriaxone 2 g IV daily starting on 07/24/2023, 4  doses ordered Follow up urine cultures  Hypothyroidism Levothyroxine 75 mcg daily resumed  Depression Home paroxetine 40 mg every evening and trazodone 25-50 mg nightly were resumed on admission  Diabetes mellitus without complication (HCC) Home metformin not resumed on admission A1c of 6.1 on 07/23/23 Insulin SSI with at bedtime coverage ordered  HLD (hyperlipidemia) Home rosuvastatin 5 milligrams nightly resumed   Hypokalemia Potassium is 3.3 with Magnesium of 1.9. -Replace K and monitor.    Subjective: Patient was feeling weak when seen today.  Urinary urgency improving.  She lives with her daughter.  Physical Exam: Vitals:   07/24/23 0703 07/24/23 0742 07/24/23 0820 07/24/23 1204  BP: (!) 204/69 (!) 222/83 (!) 164/75 (!) 163/62  Pulse: 80  99 72  Resp:   16 17  Temp:   98.2 F (36.8 C) 98.4 F (36.9 C)  TempSrc:   Oral Oral  SpO2:   90% 91%  Weight:      Height:       General.  Frail elderly lady, in no acute distress. Pulmonary.  Lungs clear bilaterally, normal respiratory effort. CV.  Regular rate and rhythm, no JVD, rub or murmur. Abdomen.  Soft, nontender, nondistended, BS positive. CNS.  Alert and oriented .  No focal neurologic deficit. Extremities.  No edema, no cyanosis, pulses intact and symmetrical.  Data Reviewed: Prior data reviewed  Family Communication: Talked with daughter on phone  Disposition: Status is: Inpatient Remains inpatient appropriate because: Severity of illness  Planned Discharge Destination: Home  DVT prophylaxis.  Subcu heparin Time spent: 45 minutes  This record has been created using Conservation officer, historic buildings. Errors have been sought and corrected,but may  not always be located. Such creation errors do not reflect on the standard of care.   Author: Arnetha Courser, MD 07/24/2023 2:36 PM  For on call review [email or web address].

## 2023-07-24 NOTE — Plan of Care (Signed)
  Problem: Education: Goal: Knowledge of General Education information will improve Description: Including pain rating scale, medication(s)/side effects and non-pharmacologic comfort measures Outcome: Progressing   Problem: Health Behavior/Discharge Planning: Goal: Ability to manage health-related needs will improve Outcome: Progressing   Problem: Clinical Measurements: Goal: Ability to maintain clinical measurements within normal limits will improve Outcome: Progressing Goal: Will remain free from infection Outcome: Progressing Goal: Diagnostic test results will improve Outcome: Progressing Goal: Cardiovascular complication will be avoided Outcome: Progressing   Problem: Nutrition: Goal: Adequate nutrition will be maintained Outcome: Progressing   Problem: Activity: Goal: Risk for activity intolerance will decrease Outcome: Progressing   Problem: Coping: Goal: Level of anxiety will decrease Outcome: Progressing   Problem: Elimination: Goal: Will not experience complications related to urinary retention Outcome: Progressing

## 2023-07-24 NOTE — Progress Notes (Signed)
Secure chat messages between provider and myself.:  Pt is an 88 year old female with history of hypertension, hypothyroid, CAD on dual antiplatelet therapy, DM type 2, admitted from ED for dizziness and left flank pain. DX UTI. B/P in the ED was 247/130.  B/P was 231/95 this am and I gave 5 mg of PRN hydralazine. B/P still elevated at  202/64, confirmed with manual pressure. Pt is also c/o h/a 5/10 pain.  5:38 AM Manuela Schwartz, NP 5/10 pain in the same flank area? 5:42 AM BM i gm acetaminophen and 50 mg robaxin. lets treat that pain first and then if we have to we can give his morning antihypertensives early 1 5:44 AM will do 5/10 pain is the headache. no other pain at this time.  5:54 AM BM Manuela Schwartz, NP same treatment plan  1 5:56 AM BM headache is reduced to 2/10 and b/p is still elevated at 204 systolic. Day shift nurse advises that yesterday it took about 2 hours post hydralazine before b/p became within normal limits

## 2023-07-24 NOTE — Assessment & Plan Note (Signed)
Potassium is 3.3 with Magnesium of 1.9. -Replace K and monitor.

## 2023-07-25 DIAGNOSIS — R109 Unspecified abdominal pain: Secondary | ICD-10-CM | POA: Diagnosis not present

## 2023-07-25 DIAGNOSIS — D509 Iron deficiency anemia, unspecified: Secondary | ICD-10-CM

## 2023-07-25 DIAGNOSIS — E785 Hyperlipidemia, unspecified: Secondary | ICD-10-CM

## 2023-07-25 DIAGNOSIS — E1169 Type 2 diabetes mellitus with other specified complication: Secondary | ICD-10-CM

## 2023-07-25 DIAGNOSIS — N1330 Unspecified hydronephrosis: Secondary | ICD-10-CM

## 2023-07-25 DIAGNOSIS — I160 Hypertensive urgency: Secondary | ICD-10-CM | POA: Diagnosis not present

## 2023-07-25 DIAGNOSIS — I10 Essential (primary) hypertension: Secondary | ICD-10-CM | POA: Diagnosis not present

## 2023-07-25 LAB — URINE CULTURE: Culture: >=100000 — AB

## 2023-07-25 LAB — GLUCOSE, CAPILLARY
Glucose-Capillary: 139 mg/dL — ABNORMAL HIGH (ref 70–99)
Glucose-Capillary: 154 mg/dL — ABNORMAL HIGH (ref 70–99)
Glucose-Capillary: 164 mg/dL — ABNORMAL HIGH (ref 70–99)

## 2023-07-25 MED ORDER — CEFUROXIME AXETIL 500 MG PO TABS
500.0000 mg | ORAL_TABLET | Freq: Two times a day (BID) | ORAL | Status: DC
  Administered 2023-07-25: 500 mg via ORAL
  Filled 2023-07-25: qty 1

## 2023-07-25 MED ORDER — IRBESARTAN 150 MG PO TABS
300.0000 mg | ORAL_TABLET | Freq: Every day | ORAL | Status: DC
  Administered 2023-07-25: 300 mg via ORAL
  Filled 2023-07-25: qty 2

## 2023-07-25 MED ORDER — CEFUROXIME AXETIL 500 MG PO TABS: 500.0000 mg | ORAL_TABLET | Freq: Two times a day (BID) | ORAL | 0 refills | Status: DC

## 2023-07-25 NOTE — TOC Transition Note (Signed)
Transition of Care [facility]) - CM/SW Discharge Note   Patient Details  Name: Mary [name] MRN: [id number] Date of Birth: May 09, 1935  [facility]) CM/SW Contact:  Margarito Liner, LCSW Phone Number: 07/25/2023, 1:49 PM   Clinical Narrative:  Patient has orders to discharge home today. CSW met with patient. No supports at bedside. CSW introduced role and explained that PT recommendations would be discussed. Patient declined HHPT but is agreeable to a RN for medication management, vitals, etc. She prefers [facility] as she has worked with them in the past. Referral accepted. DME recommendation for RW and patient is agreeable. Ordered through [employer] and asked them to expedite. Patient stated her daughter's aide can pick her up as long as it's before 3:00. No further concerns. CSW signing off.   Final next level of care: Home w Home Health Services Barriers to Discharge: Barriers Resolved   Patient Goals and CMS Choice      Discharge Placement                  Patient to be transferred to facility by: Daughter's aide   Patient and family notified of of transfer: 07/25/23  Discharge Plan and Services Additional resources added to the After Visit Summary for                  DME Arranged: Walker rolling DME Agency: [employer] Date DME Agency Contacted: 07/25/23   Representative spoke with at DME Agency: [facility] Arranged: RN [facility] Agency: [facility] Date [facility] Agency Contacted: 07/25/23   Representative spoke with at [facility] Agency: Alfonzo Beers  Social Determinants of Health (SDOH) Interventions SDOH Screenings   Food Insecurity: No Food Insecurity (07/23/2023)  Housing: Low Risk  (07/23/2023)  Transportation Needs: No Transportation Needs (07/23/2023)  Utilities: Not At Risk (07/23/2023)  Financial Resource Strain: Low Risk  (03/23/2023)   Received from [facility]  Tobacco Use: Low Risk  (07/23/2023)     Readmission Risk Interventions      No data to display

## 2023-07-25 NOTE — Discharge Summary (Signed)
Physician Discharge Summary   Patient: Mary [name] MRN: [id number] DOB: 05/11/1935  Admit date:     07/23/2023  Discharge date: 07/25/23  Discharge Physician: Arnetha Courser   PCP: Marisue Ivan, MD   Recommendations at discharge:  Please obtain CBC and BMP on follow-up Please ensure completion of antibiotics for UTI Please ensure that she is taking all of her blood pressure medications appropriately and blood pressure remained well-controlled. Follow-up with primary care provider within a week  Discharge Diagnoses: Principal Problem:   Hypertensive urgency Active Problems:   Uncontrolled hypertension   UTI (urinary tract infection)   Hypothyroidism   Depression   Diabetes mellitus without complication (HCC)   HLD (hyperlipidemia)   Hypokalemia   IDA (iron deficiency anemia)   Coronary artery disease involving native coronary artery of native heart   Type 2 diabetes mellitus with diabetic retinopathy (HCC)   Type 2 diabetes mellitus with hyperlipidemia (HCC)   Left flank pain   Hydronephrosis of left kidney   Hospital Course: [name] is an 88 year old female with history of hypertension, hypothyroid, CAD on dual antiplatelet therapy, non-insulin-dependent diabetes mellitus type 2, who presents ED for chief concerns of dizziness and left flank pain.  Vitals in the ED showed temperature 98.8, respiration rate 20, heart rate 69, blood pressure 247/130 and improved to 208/65, SpO2 of 91% on room air.  Serum sodium is 139, potassium 2.7, chloride 101, bicarb 29, BUN of 13, serum creatinine 0.77, EGFR greater than 60, nonfasting blood glucose 127, WBC 6.9, hemoglobin 13.8, platelets of 132.  UA was positive for small leukocytes and positive for nitrates. CT renal stone studies with mild left renal hydroureteronephrosis, no nephrolithiasis on left, tiny nonobstructing right renal calculi.  ED treatment: Hydralazine 10 mg IV one-time dose, followed by hydralazine 20  mg IV one-time dose at 1330, Flomax 0.4 mg, Cipro 400 mg IV one-time dose.  Started on ceftriaxone.  12/1: Vitals with elevated blood pressure at 222/83, labs stable except mild hypokalemia which is being repleted, pending urine culture.  12/2: Blood pressure remained elevated, increasing the dose of irbesartan to 300 mg daily. Urine cultures growing Klebsiella pneumonia-shows only resistant to ampicillin and partial to nitrofurantoin.  Patient was taking home hydralazine which was prescribed as 100 mg 3 times daily, at a lower dose of 50 mg 3 times daily she was instructed to go back to 100 mg 3 times daily.  She was also not taking her clonidine very regularly.  Physical therapist evaluated her recommended home health services which were ordered.  Patient will get benefit from a pillbox to avoid any confusion and taking medications regularly to avoid further hospitalizations with hypertensive urgency.  Patient received ceftriaxone while in the hospital and discharged on Ceftin for 4 more days to complete the course for UTI.  She will continue on current medications and need to have a close follow-up with her provider for further management.  Assessment and Plan: * Hypertensive urgency Blood pressure slowly improving, remained little elevated Suspect secondary to patient not understanding her medication regimen and the need for daily hypertensive medication use Apparently she was also taking her home hydralazine at 50 mg 3 times a day instead of 100 and was missing clonidine. -Continue home medications appropriately  Uncontrolled hypertension Please see above  UTI (urinary tract infection) Present on admission Ceftriaxone 2 g IV daily starting on 07/24/2023,  Urine cultures with Klebsiella pneumonia, shows resistance to only ampicillin and partial to nitrofurantoin, patient is being  discharged on Ceftin as she has taken that in the past.  Keflex allergy with hives were  noted.  Hypothyroidism Levothyroxine 75 mcg daily resumed  Depression Home paroxetine 40 mg every evening and trazodone 25-50 mg nightly were resumed on admission  Diabetes mellitus without complication (HCC) Home metformin not resumed on admission A1c of 6.1 on 07/23/23 Insulin SSI with at bedtime coverage ordered  HLD (hyperlipidemia) Home rosuvastatin 5 milligrams nightly resumed   Hypokalemia Replaced   Consultants: None Procedures performed: None Disposition: Home health Diet recommendation:  Discharge Diet Orders (From admission, onward)     Start     Ordered   07/25/23 0000  Diet - low sodium heart healthy        07/25/23 1317           Cardiac diet DISCHARGE MEDICATION: Allergies as of 07/25/2023       Reactions   Cephalexin Hives   Nitrofurantoin    Other reaction(s): Other (See Comments) Other Reaction: measles-like lesions   Sulfa Antibiotics Hives   Atorvastatin Other (See Comments)   Muscle spasms/ muscle pain        Medication List     STOP taking these medications    furosemide 20 MG tablet Commonly known as: LASIX   Gemtesa 75 MG Tabs Generic drug: Vibegron       TAKE these medications    acetaminophen 500 MG tablet Commonly known as: TYLENOL Take 500 mg by mouth every 6 (six) hours as needed for mild pain (pain score 1-3).   amLODipine 10 MG tablet Commonly known as: NORVASC Take 1 tablet (10 mg total) by mouth daily.   aspirin EC 81 MG tablet Take 1 tablet (81 mg total) by mouth daily. Swallow whole.   azelastine 0.05 % ophthalmic solution Commonly known as: OPTIVAR Place 1 drop into both eyes daily as needed (allergies).   busPIRone 10 MG tablet Commonly known as: BUSPAR Take 10 mg by mouth 2 (two) times daily.   carvedilol 3.125 MG tablet Commonly known as: COREG Take 3.125 mg by mouth 2 (two) times daily with a meal.   cefUROXime 500 MG tablet Commonly known as: CEFTIN Take 1 tablet (500 mg total) by mouth  2 (two) times daily with a meal.   cloNIDine 0.1 MG tablet Commonly known as: CATAPRES Take 0.1 mg by mouth 2 (two) times daily.   clopidogrel 75 MG tablet Commonly known as: PLAVIX Take 1 tablet (75 mg total) by mouth daily at 6 (six) AM.   hydrALAZINE 100 MG tablet Commonly known as: APRESOLINE Take 1 tablet (100 mg total) by mouth 3 (three) times daily. What changed: how much to take   levothyroxine 75 MCG tablet Commonly known as: SYNTHROID Take 75 mcg by mouth daily.   pantoprazole 20 MG tablet Commonly known as: PROTONIX Take 20 mg by mouth daily.   PARoxetine 40 MG tablet Commonly known as: PAXIL Take 40 mg by mouth every evening.   rosuvastatin 5 MG tablet Commonly known as: CRESTOR Take 1 tablet (5 mg total) by mouth daily.   tamsulosin 0.4 MG Caps capsule Commonly known as: FLOMAX Take 1 capsule (0.4 mg total) by mouth daily.   traZODone 50 MG tablet Commonly known as: DESYREL Take 1 tablet (50 mg total) by mouth at bedtime as needed for sleep. What changed: how much to take   valsartan 320 MG tablet Commonly known as: DIOVAN Take 320 mg by mouth daily.   vitamin B-12 500 MCG tablet  Commonly known as: CYANOCOBALAMIN Take 500 mcg by mouth every other day.   Vitron-C 65-125 MG Tabs Generic drug: Iron-Vitamin C Take 1 tablet by mouth daily.        Follow-up Information     Marisue Ivan, MD. Schedule an appointment as soon as possible for a visit in 1 week(s).   Specialty: Family Medicine Contact information: [address] [place] [address] [phone]                Discharge Exam: Ceasar Mons Weights   07/23/23 1030 07/23/23 1719  Weight: 73 kg 72 kg   General.  Frail elderly lady, in no acute distress. Pulmonary.  Lungs clear bilaterally, normal respiratory effort. CV.  Regular rate and rhythm, no JVD, rub or murmur. Abdomen.  Soft, nontender, nondistended, BS positive. CNS.  Alert and oriented .  No focal  neurologic deficit. Extremities.  No edema, no cyanosis, pulses intact and symmetrical. Psychiatry.  Judgment and insight appears normal.   Condition at discharge: stable  The results of significant diagnostics from this hospitalization (including imaging, microbiology, ancillary and laboratory) are listed below for reference.   Imaging Studies: MR BRAIN WO CONTRAST  Result Date: 07/23/2023 CLINICAL DATA:  Provided history: Neuro deficit, acute, stroke suspected. Additional history provided: Dizziness, hypertension, UTI. EXAM: MRI HEAD WITHOUT CONTRAST TECHNIQUE: Multiplanar, multiecho pulse sequences of the brain and surrounding structures were obtained without intravenous contrast. COMPARISON:  Head CT 07/23/2023.  Brain MRI 11/16/2019. FINDINGS: Brain: Mild generalized parenchymal volume loss. Chronic infarct within the right parietal white matter with ex vacuo dilatation of the posterior body and atrium of the right lateral ventricle. Adjacent chronic lacunar infarcts within the right frontoparietal white matter. Moderate multifocal T2 FLAIR hyperintense signal abnormality elsewhere within the cerebral white matter, nonspecific but compatible chronic small vessel ischemic disease. Prominent perivascular space within the inferior left basal ganglia. Mild chronic small vessel ischemic changes within the pons. Small chronic infarcts within the bilateral cerebellar hemisphere. No cortical encephalomalacia is identified. There is no acute infarct. No evidence of an intracranial mass. No chronic intracranial blood products. No extra-axial fluid collection. No midline shift. Vascular: Maintained flow voids within the proximal large arterial vessels. Skull and upper cervical spine: No focal worrisome marrow lesion. Incompletely assessed cervical spondylosis. Sinuses/Orbits: No mass or acute finding within the imaged orbits. Prior bilateral ocular lens replacement. No significant paranasal sinus disease.  Other: Bilateral mastoid effusions. IMPRESSION: 1. No evidence of an acute intracranial abnormality. 2. Parenchymal atrophy, chronic small vessel ischemic disease and chronic infarcts as outlined and not significantly changed from the prior brain MRI 11/16/2019. 3. Bilateral mastoid effusions. Electronically Signed   By: Jackey Loge D.O.   On: 07/23/2023 12:38   CT Renal Stone Study  Result Date: 07/23/2023 CLINICAL DATA:  Left flank pain.  Nephrolithiasis. EXAM: CT ABDOMEN AND PELVIS WITHOUT CONTRAST TECHNIQUE: Multidetector CT imaging of the abdomen and pelvis was performed following the standard protocol without IV contrast. RADIATION DOSE REDUCTION: This exam was performed according to the departmental dose-optimization program which includes automated exposure control, adjustment of the mA and/or kV according to patient size and/or use of iterative reconstruction technique. COMPARISON:  07/07/2011 FINDINGS: Lower chest: No acute findings. Stable 5 mm right middle lobe pulmonary nodule, consistent with benign etiology Hepatobiliary: No mass visualized on this unenhanced exam. Gallbladder is unremarkable. No evidence of biliary ductal dilatation. Pancreas: No mass or inflammatory process visualized on this unenhanced exam. Spleen:  Within normal limits in size.  Adrenals/Urinary tract: Tiny right renal calculi and mild scarring again noted. Mild left renal pelvicaliectasis and ureterectasis is seen. No ureteral calculi are visualized, however, distal ureters and bladder are obscured by severe artifact from bilateral hip prostheses. Stomach/Bowel: No evidence of obstruction, inflammatory process, or abnormal fluid collections. Normal appendix visualized. Diverticulosis is seen mainly involving the sigmoid colon, however there is no evidence of diverticulitis. Vascular/Lymphatic: No pathologically enlarged lymph nodes identified. No evidence of abdominal aortic aneurysm. Reproductive: Prior hysterectomy.  Limited visualization noted due to severe artifact from bilateral hip prostheses. Other:  None. Musculoskeletal:  No suspicious bone lesions identified. IMPRESSION: Mild left renal hydroureteronephrosis. No ureteral calculi are visualized, however, distal ureter and bladder are obscured by severe artifact from bilateral hip prostheses. Tiny nonobstructing right renal calculi. Colonic diverticulosis, without radiographic evidence of diverticulitis. Electronically Signed   By: Danae Orleans M.D.   On: 07/23/2023 11:57   CT Head Wo Contrast  Result Date: 07/23/2023 CLINICAL DATA:  Acute stroke suspected.  Neuro deficit. EXAM: CT HEAD WITHOUT CONTRAST TECHNIQUE: Contiguous axial images were obtained from the base of the skull through the vertex without intravenous contrast. RADIATION DOSE REDUCTION: This exam was performed according to the departmental dose-optimization program which includes automated exposure control, adjustment of the mA and/or kV according to patient size and/or use of iterative reconstruction technique. COMPARISON:  09/17/2021 FINDINGS: Brain: No evidence of acute infarction, hemorrhage, hydrocephalus, extra-axial collection or mass lesion/mass effect. Chronic infarct in the right periatrial white matter with thinning. Generalized cerebral volume loss without specific pattern. Vascular: No hyperdense vessel or unexpected calcification. Skull: Normal. Negative for fracture or focal lesion. Sinuses/Orbits: Negative IMPRESSION: No acute finding or change from 2023. Electronically Signed   By: Tiburcio Pea M.D.   On: 07/23/2023 10:58    Microbiology: Results for orders placed or performed during the hospital encounter of 07/23/23  Urine Culture (for pregnant, neutropenic or urologic patients or patients with an indwelling urinary catheter)     Status: Abnormal   Collection Time: 07/23/23 10:58 AM   Specimen: Urine, Clean Catch  Result Value Ref Range Status   Specimen Description   Final     URINE, CLEAN CATCH Performed at [facility], [address], [place], [place] [address]    Special Requests   Final    NONE Performed at [facility], [address], [place], [place] [address]    Culture >=100,000 COLONIES/mL KLEBSIELLA PNEUMONIAE (A)  Final   Report Status 07/25/2023 FINAL  Final   Organism ID, Bacteria KLEBSIELLA PNEUMONIAE (A)  Final      Susceptibility   Klebsiella pneumoniae - MIC*    AMPICILLIN RESISTANT Resistant     CEFAZOLIN <=4 SENSITIVE Sensitive     CEFEPIME <=0.12 SENSITIVE Sensitive     CEFTRIAXONE <=0.25 SENSITIVE Sensitive     CIPROFLOXACIN <=0.25 SENSITIVE Sensitive     GENTAMICIN <=1 SENSITIVE Sensitive     IMIPENEM <=0.25 SENSITIVE Sensitive     NITROFURANTOIN 64 INTERMEDIATE Intermediate     TRIMETH/SULFA <=20 SENSITIVE Sensitive     AMPICILLIN/SULBACTAM <=2 SENSITIVE Sensitive     PIP/TAZO <=4 SENSITIVE Sensitive ug/mL    * >=100,000 COLONIES/mL KLEBSIELLA PNEUMONIAE    Labs: CBC: Recent Labs  Lab 07/23/23 1058 07/24/23 0409  WBC 6.9 6.4  HGB 13.8 13.0  HCT 43.5 40.5  MCV 98.6 98.1  PLT 132* 125*   Basic Metabolic Panel: Recent Labs  Lab 07/23/23 1058 07/24/23 0409  NA 139 139  K  3.7 3.3*  CL 101 101  CO2 29 26  GLUCOSE 127* 125*  BUN 13 11  CREATININE 0.77 0.66  CALCIUM 9.5 9.2  MG  --  1.9   Liver Function Tests: No results for input(s): "AST", "ALT", "ALKPHOS", "BILITOT", "PROT", "ALBUMIN" in the last 168 hours. CBG: Recent Labs  Lab 07/24/23 1716 07/24/23 2050 07/25/23 0802 07/25/23 1224 07/25/23 1307  GLUCAP 135* 157* 139* 154* 164*    Discharge time spent: greater than 30 minutes.  This record has been created using Conservation officer, historic buildings. Errors have been sought and corrected,but may not always be located. Such creation errors do not reflect on the standard of care.   Signed: Arnetha Courser, MD [facility] 07/25/2023

## 2023-07-25 NOTE — Progress Notes (Signed)
MEWS Progress Note  Patient Details Name: Mary [name] MRN: [id number] DOB: [birth year] Today's Date: 07/25/2023   MEWS Flowsheet Documentation:  Assess: MEWS Score Temp: 98.5 F (36.9 C) BP: (!) 204/91 MAP (mmHg): 121 Pulse Rate: 84 ECG Heart Rate: 86 Resp: 20 Level of Consciousness: Alert SpO2: 90 % O2 Device: Room Air Assess: MEWS Score MEWS Temp: 0 MEWS Systolic: 2 MEWS Pulse: 0 MEWS RR: 0 MEWS LOC: 0 MEWS Score: 2 MEWS Score Color: Yellow Assess: SIRS CRITERIA SIRS Temperature : 0 SIRS Respirations : 0 SIRS Pulse: 0 SIRS WBC: 0 SIRS Score Sum : 0 SIRS Temperature : 0 SIRS Pulse: 0 SIRS Respirations : 0 SIRS WBC: 0 SIRS Score Sum : 0 Assess: if the MEWS score is Yellow or Red Were vital signs accurate and taken at a resting state?: Yes Does the patient meet 2 or more of the SIRS criteria?: No MEWS guidelines implemented : Yes, yellow Treat MEWS Interventions: Considered administering scheduled or prn medications/treatments as ordered Take Vital Signs Increase Vital Sign Frequency : Yellow: Q2hr x1, continue Q4hrs until patient remains green for 12hrs Escalate MEWS: Escalate: Yellow: Discuss with charge nurse and consider notifying provider and/or RRT Notify: Charge Nurse/RN Name of Charge Nurse/RN Notified: Amy Dalton RN    [name] 07/25/2023, 8:22 AM

## 2023-07-25 NOTE — Evaluation (Signed)
Physical Therapy Evaluation Patient Details Name: Mary [name] MRN: [id number] DOB: 05-18-1935 Today's Date: 07/25/2023  History of Present Illness  Pt is 88 y/o admitted for Hypertensive Urgency. Pt has previously been experiencing s/sx of flank pain and a headache. PmHx includes: HTN, hypothyroid, CAD on dual antiplatelet therapy, and noninsulin DM type2.   Clinical Impression  Pt received in bed and agreed to PT session. Pt reported since BP has been altered, she has noticed instances of lightheadedness, dizziness, and instability when standing or walking. Pt performed bed mobility ModI, STS with the use of RW (2wheels) SUP, and amb with RW ~43ft CGA. Pt did not report any s/sx relative to dizziness throughout the session with vitals reading 180/91 mmHg at the beginning of session while seated EOB, and 153/72 mmHg at the end of session while seated in recliner. At the end of session, pt presented with fatigue in which pt then began self prompted pursed lip breathing. Pt tolerated Tx well and will continue to benefit from skilled PT sessions to improve activity tolerance, functional mobility, and strength to maximize safety/return to PLOF following D/C.        If plan is discharge home, recommend the following: A little help with walking and/or transfers;Assist for transportation;Help with stairs or ramp for entrance   Can travel by private vehicle        Equipment Recommendations Rolling walker (2 wheels)  Recommendations for Other Services       Functional Status Assessment Patient has had a recent decline in their functional status and demonstrates the ability to make significant improvements in function in a reasonable and predictable amount of time.     Precautions / Restrictions Precautions Precautions: Fall Restrictions Weight Bearing Restrictions: No      Mobility  Bed Mobility Overal bed mobility: Modified Independent Bed Mobility: Supine to Sit     Supine to sit:  Modified independent (Device/Increase time)     General bed mobility comments: Pt performed bed mobility ModI and did not report any s/sx relative to dizziness while seated EOB.    Transfers Overall transfer level: Needs assistance Equipment used: Rolling walker (2 wheels) Transfers: Sit to/from Stand Sit to Stand: Supervision           General transfer comment: Pt performed STS with the use of RW (2wheels) SUP and did not report any s/sx relative to dizziness while standing.    Ambulation/Gait Ambulation/Gait assistance: Contact guard assist Gait Distance (Feet): 40 Feet Assistive device: Rolling walker (2 wheels) Gait Pattern/deviations: Step-through pattern Gait velocity: decreased     General Gait Details: Pt amb with the use of RW (2wheels) CGA. Pt reported a sense of instability with standing balance and amb.  Stairs            Wheelchair Mobility     Tilt Bed    Modified Rankin (Stroke Patients Only)       Balance Overall balance assessment: Needs assistance Sitting-balance support: Feet supported Sitting balance-Leahy Scale: Good     Standing balance support: Bilateral upper extremity supported, During functional activity Standing balance-Leahy Scale: Good                               Pertinent Vitals/Pain Pain Assessment Pain Assessment: No/denies pain    Home Living Family/patient expects to be discharged to:: Private residence Living Arrangements: Children (Daughter) Available Help at Discharge: Family;Available PRN/intermittently Type of Home: House Home Access:  Ramped entrance;Stairs to enter Entrance Stairs-Rails: Can reach both Entrance Stairs-Number of Steps: 3-4   Home Layout: One level Home Equipment: Standard Walker;Shower seat;Grab bars - tub/shower;Hand held shower head Additional Comments: Pt lives with her daughter who has CP. 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 Pt reports having a standing RW however, she  brought it with her to the hospital and has not seen the RW since she has been admitted.    Prior Function Prior Level of Function : Independent/Modified Independent             Mobility Comments: Pt reports IND prior to admission. Pt uses standard RW at baseline. ADLs Comments: Pt reports IND prior to admission.     Extremity/Trunk Assessment   Upper Extremity Assessment Upper Extremity Assessment: Overall WFL for tasks assessed    Lower Extremity Assessment Lower Extremity Assessment: Generalized weakness       Communication   Communication Communication: No apparent difficulties Cueing Techniques: Verbal cues  Cognition Arousal: Alert Behavior During Therapy: WFL for tasks assessed/performed Overall Cognitive Status: Within Functional Limits for tasks assessed                                 General Comments: Pt pleasant and willing to participate in PT session.        General Comments      Exercises     Assessment/Plan    PT Assessment Patient needs continued PT services  PT Problem List Decreased activity tolerance;Decreased mobility       PT Treatment Interventions Gait training;Functional mobility training;Therapeutic activities;Therapeutic exercise    PT Goals (Current goals can be found in the Care Plan section)  Acute Rehab PT Goals Patient Stated Goal: To go home PT Goal Formulation: With patient Time For Goal Achievement: 08/08/23 Potential to Achieve Goals: Good    Frequency Min 1X/week     Co-evaluation               AM-PAC PT "6 Clicks" Mobility  Outcome Measure Help needed turning from your back to your side while in a flat bed without using bedrails?: None Help needed moving from lying on your back to sitting on the side of a flat bed without using bedrails?: None Help needed moving to and from a bed to a chair (including a wheelchair)?: A Little Help needed standing up from a chair using your arms (e.g.,  wheelchair or bedside chair)?: A Little Help needed to walk in hospital room?: A Little Help needed climbing 3-5 steps with a railing? : A Little 6 Click Score: 20    End of Session Equipment Utilized During Treatment: Gait belt Activity Tolerance: Patient tolerated treatment well Patient left: in chair;with call bell/phone within reach;with chair alarm set Nurse Communication: Mobility status PT Visit Diagnosis: Unsteadiness on feet (R26.81);Other abnormalities of gait and mobility (R26.89);Muscle weakness (generalized) (M62.81);Difficulty in walking, not elsewhere classified (R26.2)    Time: 2841-3244 PT Time Calculation (min) (ACUTE ONLY): 17 min   Charges:   PT Evaluation $PT Eval Low Complexity: 1 Low PT Treatments $Gait Training: 8-22 mins PT General Charges $$ ACUTE PT VISIT: 1 Visit         Mane Consolo Sauvignon Howard SPT, LAT, ATC  Beza Steppe Sauvignon-Howard 07/25/2023, 1:21 PM

## 2023-07-25 NOTE — Progress Notes (Signed)
Nursing Discharge Note   Name: Mary [name] MRN: [id number] DOB: 1934-09-21    Admit Date:  07/23/2023  Discharge Date:  07/25/2023   Mary [name] is to be discharged home per MD order.  AVS completed. Reviewed with patient at bedside. Highlighted copy provided for patient to take home.  Patient able to verbalize understanding of discharge instructions. PIV removed. Patient stable upon discharge.   Discharge Instructions   None     Discharge Instructions     Diet - low sodium heart healthy   Complete by: As directed    Discharge instructions   Complete by: As directed    It was pleasure taking care of you. Please continue taking all of your blood pressure medications including clonidine and hydralazine at 100 mg 3 times a day instead of 50 mg. You have been given antibiotics to complete a total of 5-day course for your urinary tract infection, please take it as directed and complete the course. Please follow-up closely with your primary care provider to keep your blood pressure under good control.   Increase activity slowly   Complete by: As directed         Allergies as of 07/25/2023       Reactions   Cephalexin Hives   Nitrofurantoin    Other reaction(s): Other (See Comments) Other Reaction: measles-like lesions   Sulfa Antibiotics Hives   Atorvastatin Other (See Comments)   Muscle spasms/ muscle pain        Medication List     STOP taking these medications    furosemide 20 MG tablet Commonly known as: LASIX   Gemtesa 75 MG Tabs Generic drug: Vibegron       TAKE these medications    acetaminophen 500 MG tablet Commonly known as: TYLENOL Take 500 mg by mouth every 6 (six) hours as needed for mild pain (pain score 1-3).   amLODipine 10 MG tablet Commonly known as: NORVASC Take 1 tablet (10 mg total) by mouth daily.   aspirin EC 81 MG tablet Take 1 tablet (81 mg total) by mouth daily. Swallow whole.   azelastine 0.05 % ophthalmic  solution Commonly known as: OPTIVAR Place 1 drop into both eyes daily as needed (allergies).   busPIRone 10 MG tablet Commonly known as: BUSPAR Take 10 mg by mouth 2 (two) times daily.   carvedilol 3.125 MG tablet Commonly known as: COREG Take 3.125 mg by mouth 2 (two) times daily with a meal.   cefUROXime 500 MG tablet Commonly known as: CEFTIN Take 1 tablet (500 mg total) by mouth 2 (two) times daily with a meal.   cloNIDine 0.1 MG tablet Commonly known as: CATAPRES Take 0.1 mg by mouth 2 (two) times daily.   clopidogrel 75 MG tablet Commonly known as: PLAVIX Take 1 tablet (75 mg total) by mouth daily at 6 (six) AM.   hydrALAZINE 100 MG tablet Commonly known as: APRESOLINE Take 1 tablet (100 mg total) by mouth 3 (three) times daily. What changed: how much to take   levothyroxine 75 MCG tablet Commonly known as: SYNTHROID Take 75 mcg by mouth daily.   pantoprazole 20 MG tablet Commonly known as: PROTONIX Take 20 mg by mouth daily.   PARoxetine 40 MG tablet Commonly known as: PAXIL Take 40 mg by mouth every evening.   rosuvastatin 5 MG tablet Commonly known as: CRESTOR Take 1 tablet (5 mg total) by mouth daily.   tamsulosin 0.4 MG Caps capsule Commonly known as: FLOMAX Take  1 capsule (0.4 mg total) by mouth daily.   traZODone 50 MG tablet Commonly known as: DESYREL Take 1 tablet (50 mg total) by mouth at bedtime as needed for sleep. What changed: how much to take   valsartan 320 MG tablet Commonly known as: DIOVAN Take 320 mg by mouth daily.   vitamin B-12 500 MCG tablet Commonly known as: CYANOCOBALAMIN Take 500 mcg by mouth every other day.   Vitron-C 65-125 MG Tabs Generic drug: Iron-Vitamin C Take 1 tablet by mouth daily.               Durable Medical Equipment  (From admission, onward)           Start     Ordered   07/25/23 1338  For home use only DME Walker rolling  Once       Question Answer Comment  Walker: With 5 Inch Wheels    Patient needs a walker to treat with the following condition Generalized weakness      07/25/23 1337             Discharge Instructions/ Education: An After Visit Summary was printed and given to the patient. Discharge instructions given to patient with verbalized understanding. Discharge education completed with patient including: follow up instructions, medication list, discharge activities, and limitations if indicated.  Additional discharge instructions as indicated by discharging provider also reviewed.  Patient and family able to verbalize understanding, all questions fully answered. Patient instructed to return to Emergency Department, call 911, or call MD for any changes in condition.   Patient escorted via wheelchair to lobby and discharged home via private automobile.

## 2023-07-25 NOTE — Progress Notes (Signed)
VAST consult received to obtain IV access. Upon arrival at bedside, patient reported that she is supposed to go home today and would prefer not to be stuck for an IV. VAST RN spoke with Dr. Nelson Chimes who stated she would speak with pharmacy about changing her abx to PO Keflex. Unit RN notified via SecureChat.

## 2023-11-09 ENCOUNTER — Ambulatory Visit: Payer: Self-pay | Admitting: Urology

## 2023-11-16 ENCOUNTER — Encounter: Payer: Self-pay | Admitting: Urology

## 2023-11-16 ENCOUNTER — Ambulatory Visit: Payer: Medicare PPO | Admitting: Urology

## 2023-11-16 VITALS — BP 144/69 | HR 60 | Ht 66.0 in | Wt 158.0 lb

## 2023-11-16 DIAGNOSIS — N3281 Overactive bladder: Secondary | ICD-10-CM | POA: Diagnosis not present

## 2023-11-16 DIAGNOSIS — N390 Urinary tract infection, site not specified: Secondary | ICD-10-CM | POA: Diagnosis not present

## 2023-11-16 MED ORDER — GEMTESA 75 MG PO TABS: 75.0000 mg | ORAL_TABLET | Freq: Every day | ORAL | Status: DC

## 2023-11-16 NOTE — Progress Notes (Signed)
 I, Maysun Anabel Bene, acting as a scribe for Riki Altes, MD., have documented all relevant documentation on the behalf of Riki Altes, MD, as directed by Riki Altes, MD while in the presence of Riki Altes, MD.  11/16/2023 2:17 PM   Margaretha Sheffield 11/30/1934 [id number]  Referring provider: Marisue Ivan, MD [phone] [facility] [address] [facility] [place],  [place] [address]  Chief Complaint  Patient presents with   Medical Management of Chronic Issues   Urologic history: 1.Recurrent UTI  2. Overactive bladder. Cysto 10/2022 showed no abnormalities.  HPI: Mary [name] is a 89 y.o. female presents for a 6 month follow-up visit.  Since last visit, was hospitalized in late November 2024 for dizziness and left flank pain. Urinalysis did grow Klebsiella She had a CT renal stone study which showed small non-obstructing right renal calculi, and mild left pelvic ectasis, uterectasis without stone. Her only complaint today are OAB symptoms of urgency when standing, and occasional urge incontinence.  Has been on Gemtesa in the past, but was never started with an Rx. Based on prior office notes the medication was effective.  She gave a small amount of urine today, however not enough to test. She is asymptomatic and denies dysuria.  No gross hematuria or flank/abdominal pain.    PMH: Past Medical History:  Diagnosis Date   Acid reflux    Anemia    Arthritis    Asthma    Atherosclerosis of abdominal aorta (HCC)    B12 deficiency    Clostridium difficile infection    H/O   Coronary artery disease    DDD (degenerative disc disease), lumbar    Depression    Diabetes (HCC)    type 2   Diabetic retinopathy (HCC)    Dysrhythmia    Heart murmur    HLD (hyperlipidemia)    HTN (hypertension)    Hypotension    when get up too quickly   Hypothyroidism    Pneumonia    PONV (postoperative nausea and vomiting)    Pulmonary nodule    right middle lobe on CT scan    Pure hypercholesterolemia    RA (rheumatoid arthritis) (HCC)    Sleep apnea    Spinal stenosis    Urinary urgency     Surgical History: Past Surgical History:  Procedure Laterality Date   ABDOMINAL HYSTERECTOMY     CATARACT EXTRACTION, BILATERAL     COLONOSCOPY N/A 07/29/2021   Procedure: COLONOSCOPY;  Surgeon: Toledo, Boykin Nearing, MD;  Location: [facility];  Service: Gastroenterology;  Laterality: N/A;   CYSTOSCOPY     ENDARTERECTOMY Right 12/02/2021   Procedure: ENDARTERECTOMY CAROTID;  Surgeon: Annice Needy, MD;  Location: [facility];  Service: Vascular;  Laterality: Right;   ESOPHAGOGASTRODUODENOSCOPY N/A 07/29/2021   Procedure: ESOPHAGOGASTRODUODENOSCOPY (EGD);  Surgeon: Toledo, Boykin Nearing, MD;  Location: [facility];  Service: Gastroenterology;  Laterality: N/A;  DM   HIP ARTHROPLASTY Right 09/24/2015   Procedure: ARTHROPLASTY BIPOLAR HIP (HEMIARTHROPLASTY);  Surgeon: Christena Flake, MD;  Location: [facility];  Service: Orthopedics;  Laterality: Right;   HIP ARTHROPLASTY Left 09/29/2018   Procedure: ARTHROPLASTY BIPOLAR HIP (HEMIARTHROPLASTY) LEFT;  Surgeon: Christena Flake, MD;  Location: [facility];  Service: Orthopedics;  Laterality: Left;   KNEE ARTHROSCOPY W/ AUTOGENOUS CARTILAGE IMPLANTATION (ACI) PROCEDURE     TOTAL KNEE ARTHROPLASTY Left 12/01/2017   Procedure: TOTAL KNEE ARTHROPLASTY;  Surgeon: Christena Flake, MD;  Location: [facility];  Service:  Orthopedics;  Laterality: Left;   TOTAL KNEE ARTHROPLASTY Right 2014   VARICOSE VEIN SURGERY Left    leg    Home Medications:  Allergies as of 11/16/2023       Reactions   Cephalexin Hives   Nitrofurantoin    Other reaction(s): Other (See Comments) Other Reaction: measles-like lesions   Sulfa Antibiotics Hives   Atorvastatin Other (See Comments)   Muscle spasms/ muscle pain        Medication List        Accurate as of November 16, 2023  2:17 PM. If you have any questions, ask your nurse or doctor.           acetaminophen 500 MG tablet Commonly known as: TYLENOL Take 500 mg by mouth every 6 (six) hours as needed for mild pain (pain score 1-3).   amLODipine 10 MG tablet Commonly known as: NORVASC Take 1 tablet (10 mg total) by mouth daily.   aspirin EC 81 MG tablet Take 1 tablet (81 mg total) by mouth daily. Swallow whole.   azelastine 0.05 % ophthalmic solution Commonly known as: OPTIVAR Place 1 drop into both eyes daily as needed (allergies).   busPIRone 10 MG tablet Commonly known as: BUSPAR Take 10 mg by mouth 2 (two) times daily.   carvedilol 3.125 MG tablet Commonly known as: COREG Take 3.125 mg by mouth 2 (two) times daily with a meal.   cefUROXime 500 MG tablet Commonly known as: CEFTIN Take 1 tablet (500 mg total) by mouth 2 (two) times daily with a meal.   cloNIDine 0.1 MG tablet Commonly known as: CATAPRES Take 0.1 mg by mouth 2 (two) times daily.   clopidogrel 75 MG tablet Commonly known as: PLAVIX Take 1 tablet (75 mg total) by mouth daily at 6 (six) AM.   Gemtesa 75 MG Tabs Generic drug: Vibegron Take 1 tablet (75 mg total) by mouth daily at 6 (six) AM. Started by: Riki Altes   hydrALAZINE 100 MG tablet Commonly known as: APRESOLINE Take 1 tablet (100 mg total) by mouth 3 (three) times daily. What changed: how much to take   levothyroxine 75 MCG tablet Commonly known as: SYNTHROID Take 75 mcg by mouth daily.   pantoprazole 20 MG tablet Commonly known as: PROTONIX Take 20 mg by mouth daily.   PARoxetine 40 MG tablet Commonly known as: PAXIL Take 40 mg by mouth every evening.   rosuvastatin 5 MG tablet Commonly known as: CRESTOR Take 1 tablet (5 mg total) by mouth daily.   traZODone 50 MG tablet Commonly known as: DESYREL Take 1 tablet (50 mg total) by mouth at bedtime as needed for sleep. What changed: how much to take   valsartan 320 MG tablet Commonly known as: DIOVAN Take 320 mg by mouth daily.   vitamin B-12 500 MCG  tablet Commonly known as: CYANOCOBALAMIN Take 500 mcg by mouth every other day.   Vitron-C 65-125 MG Tabs Generic drug: Iron-Vitamin C Take 1 tablet by mouth daily.        Allergies:  Allergies  Allergen Reactions   Cephalexin Hives   Nitrofurantoin     Other reaction(s): Other (See Comments) Other Reaction: measles-like lesions   Sulfa Antibiotics Hives   Atorvastatin Other (See Comments)    Muscle spasms/ muscle pain    Family History: Family History  Problem Relation Age of Onset   Kidney disease Brother        also nephew   Heart disease Mother  Heart disease Father    Prostate cancer Neg Hx    Bladder Cancer Neg Hx    Breast cancer Neg Hx    Kidney cancer Neg Hx     Social History:  reports that she has never smoked. She has never used smokeless tobacco. She reports that she does not drink alcohol and does not use drugs.   Physical Exam: BP (!) 144/69   Pulse 60   Ht 5\' 6"  (1.676 m)   Wt 158 lb (71.7 kg)   BMI 25.50 kg/m   Constitutional:  Alert and oriented, No acute distress. HEENT: [place] AT, moist mucus membranes.  Trachea midline, no masses. Cardiovascular: No clubbing, cyanosis, or edema. Respiratory: Normal respiratory effort, no increased work of breathing. GI: Abdomen is soft, nontender, nondistended, no abdominal masses Skin: No rashes, bruises or suspicious lesions. Neurologic: Grossly intact, no focal deficits, moving all 4 extremities. Psychiatric: Normal mood and affect.   Assessment & Plan:    1. Recurrent UTIs Presently asymptomatic  2. Overactive bladder She was interested in another trial of Gentesa and was given samples. If effective, she will call back for an Rx.  [facility] [address] [place], [place] [address] [phone]

## 2023-12-07 ENCOUNTER — Inpatient Hospital Stay
Admission: EM | Admit: 2023-12-07 | Discharge: 2023-12-16 | DRG: 291 | Disposition: A | Discharge status: Skilled Nursing Facility | Attending: Student in an Organized Health Care Education/Training Program | Admitting: Student in an Organized Health Care Education/Training Program

## 2023-12-07 ENCOUNTER — Other Ambulatory Visit: Payer: Self-pay

## 2023-12-07 ENCOUNTER — Emergency Department

## 2023-12-07 DIAGNOSIS — F419 Anxiety disorder, unspecified: Secondary | ICD-10-CM | POA: Diagnosis present

## 2023-12-07 DIAGNOSIS — I5031 Acute diastolic (congestive) heart failure: Secondary | ICD-10-CM | POA: Diagnosis not present

## 2023-12-07 DIAGNOSIS — I110 Hypertensive heart disease with heart failure: Secondary | ICD-10-CM | POA: Diagnosis present

## 2023-12-07 DIAGNOSIS — M069 Rheumatoid arthritis, unspecified: Secondary | ICD-10-CM | POA: Diagnosis present

## 2023-12-07 DIAGNOSIS — E876 Hypokalemia: Secondary | ICD-10-CM | POA: Diagnosis present

## 2023-12-07 DIAGNOSIS — I5033 Acute on chronic diastolic (congestive) heart failure: Secondary | ICD-10-CM | POA: Diagnosis present

## 2023-12-07 DIAGNOSIS — Z7902 Long term (current) use of antithrombotics/antiplatelets: Secondary | ICD-10-CM

## 2023-12-07 DIAGNOSIS — Z888 Allergy status to other drugs, medicaments and biological substances status: Secondary | ICD-10-CM

## 2023-12-07 DIAGNOSIS — E039 Hypothyroidism, unspecified: Secondary | ICD-10-CM | POA: Diagnosis present

## 2023-12-07 DIAGNOSIS — Z79899 Other long term (current) drug therapy: Secondary | ICD-10-CM

## 2023-12-07 DIAGNOSIS — F32A Depression, unspecified: Secondary | ICD-10-CM | POA: Diagnosis present

## 2023-12-07 DIAGNOSIS — Z7901 Long term (current) use of anticoagulants: Secondary | ICD-10-CM | POA: Diagnosis not present

## 2023-12-07 DIAGNOSIS — Z8249 Family history of ischemic heart disease and other diseases of the circulatory system: Secondary | ICD-10-CM

## 2023-12-07 DIAGNOSIS — Z882 Allergy status to sulfonamides status: Secondary | ICD-10-CM

## 2023-12-07 DIAGNOSIS — Z9889 Other specified postprocedural states: Secondary | ICD-10-CM | POA: Diagnosis not present

## 2023-12-07 DIAGNOSIS — I272 Pulmonary hypertension, unspecified: Secondary | ICD-10-CM | POA: Diagnosis present

## 2023-12-07 DIAGNOSIS — N3001 Acute cystitis with hematuria: Secondary | ICD-10-CM | POA: Diagnosis present

## 2023-12-07 DIAGNOSIS — Z66 Do not resuscitate: Secondary | ICD-10-CM | POA: Diagnosis present

## 2023-12-07 DIAGNOSIS — E663 Overweight: Secondary | ICD-10-CM | POA: Diagnosis present

## 2023-12-07 DIAGNOSIS — Z9981 Dependence on supplemental oxygen: Secondary | ICD-10-CM | POA: Diagnosis not present

## 2023-12-07 DIAGNOSIS — J9601 Acute respiratory failure with hypoxia: Secondary | ICD-10-CM | POA: Diagnosis present

## 2023-12-07 DIAGNOSIS — Z515 Encounter for palliative care: Secondary | ICD-10-CM

## 2023-12-07 DIAGNOSIS — E7800 Pure hypercholesterolemia, unspecified: Secondary | ICD-10-CM | POA: Diagnosis present

## 2023-12-07 DIAGNOSIS — I509 Heart failure, unspecified: Principal | ICD-10-CM

## 2023-12-07 DIAGNOSIS — E1151 Type 2 diabetes mellitus with diabetic peripheral angiopathy without gangrene: Secondary | ICD-10-CM | POA: Diagnosis present

## 2023-12-07 DIAGNOSIS — I2510 Atherosclerotic heart disease of native coronary artery without angina pectoris: Secondary | ICD-10-CM | POA: Diagnosis present

## 2023-12-07 DIAGNOSIS — Z96653 Presence of artificial knee joint, bilateral: Secondary | ICD-10-CM | POA: Diagnosis present

## 2023-12-07 DIAGNOSIS — I5022 Chronic systolic (congestive) heart failure: Secondary | ICD-10-CM | POA: Diagnosis not present

## 2023-12-07 DIAGNOSIS — Z6825 Body mass index (BMI) 25.0-25.9, adult: Secondary | ICD-10-CM

## 2023-12-07 DIAGNOSIS — I482 Chronic atrial fibrillation, unspecified: Secondary | ICD-10-CM | POA: Diagnosis not present

## 2023-12-07 DIAGNOSIS — Z7984 Long term (current) use of oral hypoglycemic drugs: Secondary | ICD-10-CM

## 2023-12-07 DIAGNOSIS — Z7982 Long term (current) use of aspirin: Secondary | ICD-10-CM

## 2023-12-07 DIAGNOSIS — Z79890 Hormone replacement therapy (postmenopausal): Secondary | ICD-10-CM

## 2023-12-07 DIAGNOSIS — Z96643 Presence of artificial hip joint, bilateral: Secondary | ICD-10-CM | POA: Diagnosis present

## 2023-12-07 DIAGNOSIS — Z1152 Encounter for screening for COVID-19: Secondary | ICD-10-CM

## 2023-12-07 DIAGNOSIS — I480 Paroxysmal atrial fibrillation: Secondary | ICD-10-CM | POA: Diagnosis present

## 2023-12-07 DIAGNOSIS — J918 Pleural effusion in other conditions classified elsewhere: Secondary | ICD-10-CM | POA: Diagnosis present

## 2023-12-07 DIAGNOSIS — K219 Gastro-esophageal reflux disease without esophagitis: Secondary | ICD-10-CM | POA: Diagnosis present

## 2023-12-07 DIAGNOSIS — Z841 Family history of disorders of kidney and ureter: Secondary | ICD-10-CM

## 2023-12-07 DIAGNOSIS — Z7189 Other specified counseling: Secondary | ICD-10-CM | POA: Diagnosis not present

## 2023-12-07 DIAGNOSIS — I4891 Unspecified atrial fibrillation: Secondary | ICD-10-CM | POA: Insufficient documentation

## 2023-12-07 LAB — COMPREHENSIVE METABOLIC PANEL WITH GFR
ALT: 11 U/L (ref 0–44)
AST: 30 U/L (ref 15–41)
Albumin: 3.5 g/dL (ref 3.5–5.0)
Alkaline Phosphatase: 58 U/L (ref 38–126)
Anion gap: 10 (ref 5–15)
BUN: 12 mg/dL (ref 8–23)
CO2: 23 mmol/L (ref 22–32)
Calcium: 8.8 mg/dL — ABNORMAL LOW (ref 8.9–10.3)
Chloride: 103 mmol/L (ref 98–111)
Creatinine, Ser: 0.75 mg/dL (ref 0.44–1.00)
GFR, Estimated: >60 mL/min (ref >60)
Glucose, Bld: 170 mg/dL — ABNORMAL HIGH (ref 70–99)
Potassium: 4 mmol/L (ref 3.5–5.1)
Sodium: 136 mmol/L (ref 135–145)
Total Bilirubin: 0.6 mg/dL (ref 0.0–1.2)
Total Protein: 6.8 g/dL (ref 6.5–8.1)

## 2023-12-07 LAB — URINALYSIS, W/ REFLEX TO CULTURE (INFECTION SUSPECTED)
Bilirubin Urine: NEGATIVE
Glucose, UA: NEGATIVE mg/dL
Ketones, ur: NEGATIVE mg/dL
Nitrite: NEGATIVE
Protein, ur: NEGATIVE mg/dL
Specific Gravity, Urine: 1.005 (ref 1.005–1.030)
WBC, UA: >50 WBC/hpf (ref 0–5)
pH: 6 (ref 5.0–8.0)

## 2023-12-07 LAB — CBC WITH DIFFERENTIAL/PLATELET
Abs Immature Granulocytes: 0.03 10*3/uL (ref 0.00–0.07)
Basophils Absolute: 0 10*3/uL (ref 0.0–0.1)
Basophils Relative: 1 %
Eosinophils Absolute: 0.1 10*3/uL (ref 0.0–0.5)
Eosinophils Relative: 2 %
HCT: 42.9 % (ref 36.0–46.0)
Hemoglobin: 13.5 g/dL (ref 12.0–15.0)
Immature Granulocytes: 1 %
Lymphocytes Relative: 19 %
Lymphs Abs: 1.2 10*3/uL (ref 0.7–4.0)
MCH: 31.5 pg (ref 26.0–34.0)
MCHC: 31.5 g/dL (ref 30.0–36.0)
MCV: 100 fL (ref 80.0–100.0)
Monocytes Absolute: 0.5 10*3/uL (ref 0.1–1.0)
Monocytes Relative: 8 %
Neutro Abs: 4.3 10*3/uL (ref 1.7–7.7)
Neutrophils Relative %: 69 %
Platelets: 189 10*3/uL (ref 150–400)
RBC: 4.29 MIL/uL (ref 3.87–5.11)
RDW: 13.7 % (ref 11.5–15.5)
WBC: 6.1 10*3/uL (ref 4.0–10.5)
nRBC: 0 % (ref 0.0–0.2)

## 2023-12-07 LAB — RESP PANEL BY RT-PCR (RSV, FLU A&B, COVID)  RVPGX2
Influenza A by PCR: NEGATIVE
Influenza B by PCR: NEGATIVE
Resp Syncytial Virus by PCR: NEGATIVE
SARS Coronavirus 2 by RT PCR: NEGATIVE

## 2023-12-07 LAB — TSH: TSH: 2.976 u[IU]/mL (ref 0.350–4.500)

## 2023-12-07 LAB — PROCALCITONIN: Procalcitonin: <0.1 ng/mL

## 2023-12-07 LAB — TROPONIN I (HIGH SENSITIVITY)
Troponin I (High Sensitivity): 10 ng/L (ref <18)
Troponin I (High Sensitivity): 8 ng/L (ref <18)

## 2023-12-07 LAB — LACTIC ACID, PLASMA
Lactic Acid, Venous: 1.4 mmol/L (ref 0.5–1.9)
Lactic Acid, Venous: 1.5 mmol/L (ref 0.5–1.9)

## 2023-12-07 LAB — MAGNESIUM: Magnesium: 1.9 mg/dL (ref 1.7–2.4)

## 2023-12-07 LAB — PROTIME-INR
INR: 1.1 (ref 0.8–1.2)
Prothrombin Time: 14.3 s (ref 11.4–15.2)

## 2023-12-07 LAB — PHOSPHORUS: Phosphorus: 2.5 mg/dL (ref 2.5–4.6)

## 2023-12-07 LAB — BRAIN NATRIURETIC PEPTIDE: B Natriuretic Peptide: 520.4 pg/mL — ABNORMAL HIGH (ref 0.0–100.0)

## 2023-12-07 MED ORDER — PANTOPRAZOLE SODIUM 20 MG PO TBEC
20.0000 mg | DELAYED_RELEASE_TABLET | Freq: Every day | ORAL | Status: DC
  Administered 2023-12-07 – 2023-12-16 (×10): 20 mg via ORAL
  Filled 2023-12-07 (×10): qty 1

## 2023-12-07 MED ORDER — ONDANSETRON HCL 4 MG/2ML IJ SOLN
4.0000 mg | Freq: Four times a day (QID) | INTRAMUSCULAR | Status: DC | PRN
Start: 2023-12-07 — End: 2023-12-16

## 2023-12-07 MED ORDER — LEVOFLOXACIN IN D5W 750 MG/150ML IV SOLN
750.0000 mg | Freq: Once | INTRAVENOUS | Status: AC
  Administered 2023-12-07: 750 mg via INTRAVENOUS
  Filled 2023-12-07: qty 150

## 2023-12-07 MED ORDER — ROSUVASTATIN CALCIUM 5 MG PO TABS
5.0000 mg | ORAL_TABLET | Freq: Every day | ORAL | Status: DC
  Administered 2023-12-08 – 2023-12-16 (×9): 5 mg via ORAL
  Filled 2023-12-07 (×9): qty 1

## 2023-12-07 MED ORDER — IRBESARTAN 150 MG PO TABS
300.0000 mg | ORAL_TABLET | Freq: Every day | ORAL | Status: DC
  Administered 2023-12-08 – 2023-12-13 (×6): 300 mg via ORAL
  Filled 2023-12-07 (×6): qty 2

## 2023-12-07 MED ORDER — LEVOTHYROXINE SODIUM 50 MCG PO TABS
75.0000 ug | ORAL_TABLET | Freq: Every day | ORAL | Status: DC
  Administered 2023-12-08 – 2023-12-16 (×9): 75 ug via ORAL
  Filled 2023-12-07 (×9): qty 1

## 2023-12-07 MED ORDER — DIGOXIN 0.25 MG/ML IJ SOLN
0.1250 mg | Freq: Four times a day (QID) | INTRAMUSCULAR | Status: AC
  Administered 2023-12-07 – 2023-12-08 (×4): 0.125 mg via INTRAVENOUS
  Filled 2023-12-07 (×7): qty 2

## 2023-12-07 MED ORDER — TRAZODONE HCL 50 MG PO TABS
25.0000 mg | ORAL_TABLET | Freq: Every evening | ORAL | Status: DC | PRN
  Administered 2023-12-08 – 2023-12-15 (×8): 25 mg via ORAL
  Filled 2023-12-07 (×8): qty 1

## 2023-12-07 MED ORDER — BUSPIRONE HCL 10 MG PO TABS
10.0000 mg | ORAL_TABLET | Freq: Two times a day (BID) | ORAL | Status: DC
  Administered 2023-12-07 – 2023-12-16 (×18): 10 mg via ORAL
  Filled 2023-12-07 (×6): qty 1
  Filled 2023-12-07: qty 2
  Filled 2023-12-07 (×9): qty 1
  Filled 2023-12-07: qty 2
  Filled 2023-12-07: qty 1

## 2023-12-07 MED ORDER — SODIUM CHLORIDE 0.9 % IV SOLN: 250.0000 mL | INTRAVENOUS | Status: AC | PRN

## 2023-12-07 MED ORDER — KETOTIFEN FUMARATE 0.035 % OP SOLN
1.0000 [drp] | Freq: Two times a day (BID) | OPHTHALMIC | Status: DC
  Administered 2023-12-07 – 2023-12-16 (×18): 1 [drp] via OPHTHALMIC
  Filled 2023-12-07: qty 5

## 2023-12-07 MED ORDER — FUROSEMIDE 10 MG/ML IJ SOLN
40.0000 mg | Freq: Two times a day (BID) | INTRAMUSCULAR | Status: DC
  Administered 2023-12-07 – 2023-12-10 (×6): 40 mg via INTRAVENOUS
  Filled 2023-12-07 (×6): qty 4

## 2023-12-07 MED ORDER — HYDRALAZINE HCL 50 MG PO TABS
50.0000 mg | ORAL_TABLET | Freq: Three times a day (TID) | ORAL | Status: DC
  Administered 2023-12-07 – 2023-12-12 (×15): 50 mg via ORAL
  Filled 2023-12-07 (×17): qty 1

## 2023-12-07 MED ORDER — ENOXAPARIN SODIUM 40 MG/0.4ML IJ SOSY
40.0000 mg | PREFILLED_SYRINGE | INTRAMUSCULAR | Status: DC
Start: 2023-12-07 — End: 2023-12-09
  Administered 2023-12-07 – 2023-12-08 (×2): 40 mg via SUBCUTANEOUS
  Filled 2023-12-07 (×2): qty 0.4

## 2023-12-07 MED ORDER — CARVEDILOL 6.25 MG PO TABS
6.2500 mg | ORAL_TABLET | Freq: Two times a day (BID) | ORAL | Status: DC
Start: 2023-12-07 — End: 2023-12-08
  Administered 2023-12-07 – 2023-12-08 (×2): 6.25 mg via ORAL
  Filled 2023-12-07 (×2): qty 1

## 2023-12-07 MED ORDER — ACETAMINOPHEN 500 MG PO TABS
500.0000 mg | ORAL_TABLET | Freq: Four times a day (QID) | ORAL | Status: DC | PRN
  Administered 2023-12-08 – 2023-12-16 (×10): 500 mg via ORAL
  Filled 2023-12-07 (×10): qty 1

## 2023-12-07 MED ORDER — PAROXETINE HCL 20 MG PO TABS
40.0000 mg | ORAL_TABLET | Freq: Every evening | ORAL | Status: DC
  Administered 2023-12-07 – 2023-12-15 (×9): 40 mg via ORAL
  Filled 2023-12-07 (×11): qty 2

## 2023-12-07 MED ORDER — SODIUM CHLORIDE 0.9% FLUSH
3.0000 mL | Freq: Two times a day (BID) | INTRAVENOUS | Status: DC
  Administered 2023-12-07 – 2023-12-16 (×19): 3 mL via INTRAVENOUS

## 2023-12-07 MED ORDER — ASPIRIN 81 MG PO TBEC
81.0000 mg | DELAYED_RELEASE_TABLET | Freq: Every day | ORAL | Status: DC
  Administered 2023-12-08 – 2023-12-09 (×2): 81 mg via ORAL
  Filled 2023-12-07 (×2): qty 1

## 2023-12-07 MED ORDER — LACTATED RINGERS IV BOLUS (SEPSIS)
1000.0000 mL | Freq: Once | INTRAVENOUS | Status: AC
  Administered 2023-12-07: 1000 mL via INTRAVENOUS

## 2023-12-07 MED ORDER — MIRABEGRON ER 25 MG PO TB24
25.0000 mg | ORAL_TABLET | Freq: Every day | ORAL | Status: DC
  Administered 2023-12-08 – 2023-12-16 (×9): 25 mg via ORAL
  Filled 2023-12-07 (×9): qty 1

## 2023-12-07 MED ORDER — FUROSEMIDE 10 MG/ML IJ SOLN
40.0000 mg | Freq: Once | INTRAMUSCULAR | Status: AC
  Administered 2023-12-07: 40 mg via INTRAVENOUS
  Filled 2023-12-07: qty 4

## 2023-12-07 MED ORDER — FE FUM-VIT C-VIT B12-FA 460-60-0.01-1 MG PO CAPS
1.0000 | ORAL_CAPSULE | Freq: Every day | ORAL | Status: DC
  Administered 2023-12-08 – 2023-12-16 (×9): 1 via ORAL
  Filled 2023-12-07 (×10): qty 1

## 2023-12-07 MED ORDER — SODIUM CHLORIDE 0.9% FLUSH: 3.0000 mL | INTRAVENOUS | Status: DC | PRN

## 2023-12-07 NOTE — ED Provider Notes (Signed)
 [facility] Provider Note    Event Date/Time   First MD Initiated Contact with Patient 12/07/23 1233     (approximate)   History   Shortness of Breath   HPI Mary [name] is a 89 y.o. female with history of HTN, HLD, hypothyroidism, CAD, DM2, HFpEF presenting today for shortness of breath.  Patient states over the past week she has had worsening shortness of breath with nonproductive cough and congestion.  She had noted increased swelling in her legs as well.  She otherwise denies fever, chest pain, abdominal pain, nausea, vomiting, diarrhea, constipation.  Has had dysuria as well for over a week now.  Was seen by her outpatient provider and they had just called in an antibiotic which she had not started yet.     Physical Exam   Triage Vital Signs: ED Triage Vitals  Encounter Vitals Group     BP 12/07/23 1238 (!) 138/106     Systolic BP Percentile --      Diastolic BP Percentile --      Pulse Rate 12/07/23 1238 (!) 120     Resp 12/07/23 1238 (!) 22     Temp 12/07/23 1238 98.1 F (36.7 C)     Temp Source 12/07/23 1238 Oral     SpO2 12/07/23 1238 94 %     Weight --      Height --      Head Circumference --      Peak Flow --      Pain Score 12/07/23 1235 0     Pain Loc --      Pain Education --      Exclude from Growth Chart --     Most recent vital signs: Vitals:   12/07/23 1336 12/07/23 1400  BP:  128/72  Pulse: 89 84  Resp: (!) 23 (!) 27  Temp:    SpO2: 100% 95%   Physical Exam: I have reviewed the vital signs and nursing notes. General: Awake, alert, no acute distress.  Nontoxic appearing. Head:  Atraumatic, normocephalic.   ENT:  EOM intact, PERRL. Oral mucosa is pink and moist with no lesions. Neck: Neck is supple with full range of motion, No meningeal signs. Cardiovascular:  RRR, No murmurs. Peripheral pulses palpable and equal bilaterally. Respiratory:  Symmetrical chest wall expansion.  Slight tachypnea with diminished breath  sounds in the left bases.  Rhonchi present.  No wheezing. Musculoskeletal:  No cyanosis. 2+ pitting edema bilaterally. Moving extremities with full ROM Abdomen:  Soft, nontender, nondistended. Neuro:  GCS 15, moving all four extremities, interacting appropriately. Speech clear. Psych:  Calm, appropriate.   Skin:  Warm, dry, no rash.    ED Results / Procedures / Treatments   Labs (all labs ordered are listed, but only abnormal results are displayed) Labs Reviewed  COMPREHENSIVE METABOLIC PANEL WITH GFR - Abnormal; Notable for the following components:      Result Value   Glucose, Bld 170 (*)    Calcium 8.8 (*)    All other components within normal limits  URINALYSIS, W/ REFLEX TO CULTURE (INFECTION SUSPECTED) - Abnormal; Notable for the following components:   Color, Urine YELLOW (*)    APPearance HAZY (*)    Hgb urine dipstick SMALL (*)    Leukocytes,Ua LARGE (*)    Bacteria, UA MANY (*)    All other components within normal limits  RESP PANEL BY RT-PCR (RSV, FLU A&B, COVID)  RVPGX2  CULTURE, BLOOD (ROUTINE X  2)  CULTURE, BLOOD (ROUTINE X 2)  URINE CULTURE  LACTIC ACID, PLASMA  CBC WITH DIFFERENTIAL/PLATELET  PROCALCITONIN  PROTIME-INR  LACTIC ACID, PLASMA  BRAIN NATRIURETIC PEPTIDE  TROPONIN I (HIGH SENSITIVITY)  TROPONIN I (HIGH SENSITIVITY)     EKG My EKG interpretation: Rate of 112, atrial fibrillation.  Right axis deviation.  No acute ST elevation or depression   RADIOLOGY Independently interpreted chest x-ray with evidence of vascular congestion and volume overload more consistent with CHF   PROCEDURES:  Critical Care performed: Yes, see critical care procedure note(s)  .Critical Care  Performed by: Janith Lima, MD Authorized by: Janith Lima, MD   Critical care provider statement:    Critical care time (minutes):  30   Critical care was necessary to treat or prevent imminent or life-threatening deterioration of the following conditions:   Respiratory failure   Critical care was time spent personally by me on the following activities:  Development of treatment plan with patient or surrogate, discussions with consultants, evaluation of patient's response to treatment, examination of patient, ordering and review of laboratory studies, ordering and review of radiographic studies, ordering and performing treatments and interventions, pulse oximetry, re-evaluation of patient's condition and review of old charts   I assumed direction of critical care for this patient from another provider in my specialty: no     Care discussed with: admitting provider      MEDICATIONS ORDERED IN ED: Medications  levofloxacin (LEVAQUIN) IVPB 750 mg (750 mg Intravenous New Bag/Given 12/07/23 1322)  furosemide (LASIX) injection 40 mg (has no administration in time range)  lactated ringers bolus 1,000 mL (1,000 mLs Intravenous New Bag/Given 12/07/23 1318)     IMPRESSION / MDM / ASSESSMENT AND PLAN / ED COURSE  I reviewed the triage vital signs and the nursing notes.                              Differential diagnosis includes, but is not limited to, CHF exacerbation, pneumonia, COVID, flu, RSV, UTI  Patient's presentation is most consistent with acute presentation with potential threat to life or bodily function.  Patient is an 89 year old female presenting today for worsening shortness of breath at baseline.  On arrival she was tachycardic and tachypneic with some symptoms concerning for CHF versus pulmonary infection.  Started on sepsis protocol and given 1 L of fluids with levofloxacin given prior rashes to cephalosporin.  Laboratory workup ultimately showed no leukocytosis, negative Pro-Cal, negative lactic acid.  Chest x-ray more indicative of CHF and volume overload.  Will give 40 mg IV Lasix at this time.  Separately she did have a UTI which she is known but not started treatment for yet.  This is covered by the Levaquin.  Negative for COVID, flu,  RSV.  Will admit to hospitalist for acute hypoxic respiratory failure in the setting of volume overload as well as treatment of her UTI.  The patient is on the cardiac monitor to evaluate for evidence of arrhythmia and/or significant heart rate changes.     FINAL CLINICAL IMPRESSION(S) / ED DIAGNOSES   Final diagnoses:  Acute on chronic congestive heart failure, unspecified heart failure type (HCC)  Acute hypoxic respiratory failure (HCC)  Acute cystitis with hematuria  Paroxysmal atrial fibrillation (HCC)     Rx / DC Orders   ED Discharge Orders     None        Note:  This document  was prepared using Conservation officer, historic buildings and may include unintentional dictation errors.   Kandee Orion, MD 12/07/23 [phone]

## 2023-12-07 NOTE — Consult Note (Signed)
 CODE SEPSIS - PHARMACY COMMUNICATION  **Broad Spectrum Antibiotics should be administered within 1 hour of Sepsis diagnosis**  Time Code Sepsis Called/Page Received: 1253  Antibiotics Ordered: levaquin  Time of 1st antibiotic administration: 1322  Additional action taken by pharmacy: none  If necessary, Name of Provider/Nurse Contacted: n/a   Zakariyya Helfman Rodriguez-Guzman PharmD, BCPS 12/07/2023 12:58 PM

## 2023-12-07 NOTE — ED Notes (Signed)
 Pt placed on purwick for IV dieresis.

## 2023-12-07 NOTE — Sepsis Progress Note (Signed)
 Elink will follow per sepsis protocol.

## 2023-12-07 NOTE — H&P (Signed)
 History and Physical    Mary [name] EXB:[id number] DOB: August 17, 1935 DOA: 12/07/2023  PCP: Marisue Ivan, MD (Confirm with patient/family/NH records and if not entered, this has to be entered at [facility] point of entry) Patient coming from: Home  I have personally briefly reviewed patient's old medical records in [facility]  Chief Complaint: SOB, cough, leg swelling  HPI: Mary [name] is a 89 y.o. female with medical history significant of HTN, chronic HFpEF, CAD, hypothyroidism, recurrent nosebleed off dual antiplatelet therapy, question of PAF, presented with worsening of cough shortness of breath and peripheral edema.  Symptoms started 7 days ago, patient started develop dry cough with increasing exertional dyspnea and orthopnea, same time she is also started to notice worsening of peripheral swelling.  No fever chills no chest pains.  Today, even at rest she started to have shortness of breath and decided to come to the hospital.  ED Course: Afebrile, heart rate 130-150, blood pressure elevated SBP 140-150s.  Chest x-ray showed pulmonary congestion and small bilateral pleural effusions.  Blood work showed K4.0, creatinine 0.7 WBC 6.1, hemoglobin 13.5. UA showed 2+ WBC.  Patient was given IV Lasix x 1.  IR 1000 mL, Levaquin x 1.  Review of Systems: As per HPI otherwise 14 point review of systems negative.    Past Medical History:  Diagnosis Date   Acid reflux    Anemia    Arthritis    Asthma    Atherosclerosis of abdominal aorta (HCC)    B12 deficiency    Clostridium difficile infection    H/O   Coronary artery disease    DDD (degenerative disc disease), lumbar    Depression    Diabetes (HCC)    type 2   Diabetic retinopathy (HCC)    Dysrhythmia    Heart murmur    HLD (hyperlipidemia)    HTN (hypertension)    Hypotension    when get up too quickly   Hypothyroidism    Pneumonia    PONV (postoperative nausea and vomiting)    Pulmonary nodule    right middle  lobe on CT scan   Pure hypercholesterolemia    RA (rheumatoid arthritis) (HCC)    Sleep apnea    Spinal stenosis    Urinary urgency     Past Surgical History:  Procedure Laterality Date   ABDOMINAL HYSTERECTOMY     CATARACT EXTRACTION, BILATERAL     COLONOSCOPY N/A 07/29/2021   Procedure: COLONOSCOPY;  Surgeon: Toledo, Boykin Nearing, MD;  Location: [facility] ENDOSCOPY;  Service: Gastroenterology;  Laterality: N/A;   CYSTOSCOPY     ENDARTERECTOMY Right 12/02/2021   Procedure: ENDARTERECTOMY CAROTID;  Surgeon: Annice Needy, MD;  Location: [facility] ORS;  Service: Vascular;  Laterality: Right;   ESOPHAGOGASTRODUODENOSCOPY N/A 07/29/2021   Procedure: ESOPHAGOGASTRODUODENOSCOPY (EGD);  Surgeon: Toledo, Boykin Nearing, MD;  Location: [facility] ENDOSCOPY;  Service: Gastroenterology;  Laterality: N/A;  DM   HIP ARTHROPLASTY Right 09/24/2015   Procedure: ARTHROPLASTY BIPOLAR HIP (HEMIARTHROPLASTY);  Surgeon: Christena Flake, MD;  Location: [facility] ORS;  Service: Orthopedics;  Laterality: Right;   HIP ARTHROPLASTY Left 09/29/2018   Procedure: ARTHROPLASTY BIPOLAR HIP (HEMIARTHROPLASTY) LEFT;  Surgeon: Christena Flake, MD;  Location: [facility] ORS;  Service: Orthopedics;  Laterality: Left;   KNEE ARTHROSCOPY W/ AUTOGENOUS CARTILAGE IMPLANTATION (ACI) PROCEDURE     TOTAL KNEE ARTHROPLASTY Left 12/01/2017   Procedure: TOTAL KNEE ARTHROPLASTY;  Surgeon: Christena Flake, MD;  Location: [facility] ORS;  Service: Orthopedics;  Laterality: Left;  TOTAL KNEE ARTHROPLASTY Right 2014   VARICOSE VEIN SURGERY Left    leg     reports that she has never smoked. She has never used smokeless tobacco. She reports that she does not drink alcohol and does not use drugs.  Allergies  Allergen Reactions   Cephalexin Hives   Nitrofurantoin     Other reaction(s): Other (See Comments) Other Reaction: measles-like lesions   Sulfa Antibiotics Hives   Atorvastatin Other (See Comments)    Muscle spasms/ muscle pain    Family History  Problem Relation Age  of Onset   Kidney disease Brother        also nephew   Heart disease Mother    Heart disease Father    Prostate cancer Neg Hx    Bladder Cancer Neg Hx    Breast cancer Neg Hx    Kidney cancer Neg Hx      Prior to Admission medications   Medication Sig Start Date End Date Taking? Authorizing Provider  acetaminophen (TYLENOL) 500 MG tablet Take 500 mg by mouth every 6 (six) hours as needed for mild pain (pain score 1-3).    [provider]  amLODipine (NORVASC) 10 MG tablet Take 1 tablet (10 mg total) by mouth daily. 09/20/21   Sunnie Nielsen, DO  aspirin EC 81 MG EC tablet Take 1 tablet (81 mg total) by mouth daily. Swallow whole. 09/20/21   Sunnie Nielsen, DO  azelastine (OPTIVAR) 0.05 % ophthalmic solution Place 1 drop into both eyes daily as needed (allergies).    [provider]  busPIRone (BUSPAR) 10 MG tablet Take 10 mg by mouth 2 (two) times daily.    [provider]  carvedilol (COREG) 3.125 MG tablet Take 3.125 mg by mouth 2 (two) times daily with a meal.    [provider]  cefUROXime (CEFTIN) 500 MG tablet Take 1 tablet (500 mg total) by mouth 2 (two) times daily with a meal. 07/25/23   Arnetha Courser, MD  cloNIDine (CATAPRES) 0.1 MG tablet Take 0.1 mg by mouth 2 (two) times daily.    [provider]  clopidogrel (PLAVIX) 75 MG tablet Take 1 tablet (75 mg total) by mouth daily at 6 (six) AM. 06/27/23   Georgiana Spinner, NP  hydrALAZINE (APRESOLINE) 100 MG tablet Take 1 tablet (100 mg total) by mouth 3 (three) times daily. Patient taking differently: Take 50 mg by mouth 3 (three) times daily. 11/18/19   Sreenath, Jonelle Sports, MD  Iron-Vitamin C (VITRON-C) 65-125 MG TABS Take 1 tablet by mouth daily. 02/24/21   Rickard Patience, MD  levothyroxine (SYNTHROID, LEVOTHROID) 75 MCG tablet Take 75 mcg by mouth daily.     [provider]  pantoprazole (PROTONIX) 20 MG tablet Take 20 mg by mouth daily.    [provider]  PARoxetine  (PAXIL) 40 MG tablet Take 40 mg by mouth every evening.    [provider]  rosuvastatin (CRESTOR) 5 MG tablet Take 1 tablet (5 mg total) by mouth daily. 04/22/22   Georgiana Spinner, NP  traZODone (DESYREL) 50 MG tablet Take 1 tablet (50 mg total) by mouth at bedtime as needed for sleep. Patient taking differently: Take 25 mg by mouth at bedtime as needed for sleep. 09/19/21   Sunnie Nielsen, DO  valsartan (DIOVAN) 320 MG tablet Take 320 mg by mouth daily.    [provider]  Vibegron (GEMTESA) 75 MG TABS Take 1 tablet (75 mg total) by mouth daily at 6 (six)  AM. 11/16/23   Riki Altes, MD  vitamin B-12 (CYANOCOBALAMIN) 500 MCG tablet Take 500 mcg by mouth every other day.    [provider]    Physical Exam: Vitals:   12/07/23 1335 12/07/23 1336 12/07/23 1400 12/07/23 1500  BP: (!) 147/91  128/72 (!) 145/111  Pulse: (!) 128 89 84 (!) 133  Resp: 17 (!) 23 (!) 27 (!) 24  Temp:      TempSrc:      SpO2:  100% 95% 93%  Weight:        Constitutional: NAD, calm, comfortable Vitals:   12/07/23 1335 12/07/23 1336 12/07/23 1400 12/07/23 1500  BP: (!) 147/91  128/72 (!) 145/111  Pulse: (!) 128 89 84 (!) 133  Resp: 17 (!) 23 (!) 27 (!) 24  Temp:      TempSrc:      SpO2:  100% 95% 93%  Weight:       Eyes: PERRL, lids and conjunctivae normal ENMT: Mucous membranes are moist. Posterior pharynx clear of any exudate or lesions.Normal dentition.  Neck: normal, supple, no masses, no thyromegaly Respiratory: clear to auscultation bilaterally, no wheezing, fine crackles on bilateral lower field, increasing respiratory effort. No accessory muscle use.  Cardiovascular: Regular rate and rhythm, no murmurs / rubs / gallops.  2+ extremity edema. 2+ pedal pulses. No carotid bruits.  Abdomen: no tenderness, no masses palpated. No hepatosplenomegaly. Bowel sounds positive.  Musculoskeletal: no clubbing / cyanosis. No joint deformity upper and lower extremities. Good ROM, no  contractures. Normal muscle tone.  Skin: no rashes, lesions, ulcers. No induration Neurologic: CN 2-12 grossly intact. Sensation intact, DTR normal. Strength 5/5 in all 4.  Psychiatric: Normal judgment and insight. Alert and oriented x 3. Normal mood.     Labs on Admission: I have personally reviewed following labs and imaging studies  CBC: Recent Labs  Lab 12/07/23 1242  WBC 6.1  NEUTROABS 4.3  HGB 13.5  HCT 42.9  MCV 100.0  PLT 189   Basic Metabolic Panel: Recent Labs  Lab 12/07/23 1242  NA 136  K 4.0  CL 103  CO2 23  GLUCOSE 170*  BUN 12  CREATININE 0.75  CALCIUM 8.8*   GFR: Estimated Creatinine Clearance: 51.2 mL/min (by C-G formula based on SCr of 0.75 mg/dL). Liver Function Tests: Recent Labs  Lab 12/07/23 1242  AST 30  ALT 11  ALKPHOS 58  BILITOT 0.6  PROT 6.8  ALBUMIN 3.5   No results for input(s): "LIPASE", "AMYLASE" in the last 168 hours. No results for input(s): "AMMONIA" in the last 168 hours. Coagulation Profile: Recent Labs  Lab 12/07/23 1309  INR 1.1   Cardiac Enzymes: No results for input(s): "CKTOTAL", "CKMB", "CKMBINDEX", "TROPONINI" in the last 168 hours. BNP (last 3 results) No results for input(s): "PROBNP" in the last 8760 hours. HbA1C: No results for input(s): "HGBA1C" in the last 72 hours. CBG: No results for input(s): "GLUCAP" in the last 168 hours. Lipid Profile: No results for input(s): "CHOL", "HDL", "LDLCALC", "TRIG", "CHOLHDL", "LDLDIRECT" in the last 72 hours. Thyroid Function Tests: No results for input(s): "TSH", "T4TOTAL", "FREET4", "T3FREE", "THYROIDAB" in the last 72 hours. Anemia Panel: No results for input(s): "VITAMINB12", "FOLATE", "FERRITIN", "TIBC", "IRON", "RETICCTPCT" in the last 72 hours. Urine analysis:    Component Value Date/Time   COLORURINE YELLOW (A) 12/07/2023 1309   APPEARANCEUR HAZY (A) 12/07/2023 1309   APPEARANCEUR Hazy (A) 05/09/2023 1300   LABSPEC 1.005 12/07/2023 1309   LABSPEC 1.005  12/29/2013 0119   PHURINE 6.0 12/07/2023 1309   GLUCOSEU NEGATIVE 12/07/2023 1309   GLUCOSEU Negative 12/29/2013 0119   HGBUR SMALL (A) 12/07/2023 1309   BILIRUBINUR NEGATIVE 12/07/2023 1309   BILIRUBINUR Negative 05/09/2023 1300   BILIRUBINUR Negative 12/29/2013 0119   KETONESUR NEGATIVE 12/07/2023 1309   PROTEINUR NEGATIVE 12/07/2023 1309   NITRITE NEGATIVE 12/07/2023 1309   LEUKOCYTESUR LARGE (A) 12/07/2023 1309   LEUKOCYTESUR 1+ 12/29/2013 0119    Radiological Exams on Admission: DG Chest Port 1 View Result Date: 12/07/2023 CLINICAL DATA:  Questionable sepsis - evaluate for abnormality EXAM: PORTABLE CHEST 1 VIEW COMPARISON:  09/17/2021. FINDINGS: There are diffuse mild-to-moderately increased interstitial markings. There is left retrocardiac airspace opacity obscuring the left hemidiaphragm, descending thoracic aorta and blunting the left lateral costophrenic angle, suggesting combination of left lung atelectasis and/or consolidation with pleural effusion. Bilateral lungs are otherwise clear. There is blunting of right lateral costophrenic angle suggesting small right pleural effusion. Stable cardio-mediastinal silhouette. No acute osseous abnormalities. The soft tissues are within normal limits. IMPRESSION: 1. Findings favor congestive heart failure/pulmonary edema. 2. There is left retrocardiac opacity, as described above. 3. Small bilateral pleural effusions. Electronically Signed   By: Beula Brunswick M.D.   On: 12/07/2023 13:35    EKG: Independently reviewed.  It with RVR, no acute ST changes.  Assessment/Plan Principal Problem:   CHF (congestive heart failure) (HCC) Active Problems:   Acute respiratory failure with hypoxia (HCC)   Acute on chronic diastolic CHF (congestive heart failure) (HCC)   A-fib (HCC)  (please populate well all problems here in Problem List. (For example, if patient is on BP meds at home and you resume or decide to hold them, it is a problem that needs to  be her. Same for CAD, COPD, HLD and so on)  Acute hypoxic respiratory failure Acute on chronic HFpEF decompensation - Likely secondary to A-fib with RVR - Rate control as below - Continue IV Lasix 40 mg twice daily -Hold off amlodipine and clonidine as patient in heart failure decompensation. - Echocardiogram - Strict INO's  PAF with RVR - Given there is a concurrent CHF decompensation, will start digoxin loading for rate control.  Digoxin 0.125 mg every 6 hours x 4 - Increase Coreg dosage to 6.5 mg twice daily - Decrease irbesartan dosage - Check magnesium and phosphorus level -CHADS2=3, indication for systemic anticoagulation.  However patient reported that she has had repeated severe nosebleed recently and antiplatelet therapy of dual antiplatelet aspirin Plavix have been held for 2 months.  Will start her on aspirin, and recommend she discuss the issue of anticoagulation with cardiology as outpatient.  Hypothyroidism - Check TSH - Continue Synthroid  Anxiety/depression - Continue Prozac and trazodone at bedtime  IIDM - A1c 6.1 - Continue diet control  Deconditioning - PT evaluation  DVT prophylaxis: Lovenox Code Status: DNR/DNI Family Communication: None at bedside Disposition Plan: Patient is sick with uncontrolled A-fib and CHF decompensation requiring IV rate control medications and IV diuresis, expect more than 2 midnight hospital stay Consults called: None Admission status: PCU admit   [place] MD [facility] Pager [phone]  12/07/2023, 4:02 PM

## 2023-12-07 NOTE — ED Triage Notes (Signed)
 Coming from home SOB X4 days. Gotten worse today. EMS reports 82% on RA. Now 95% on 2L. Hx of CHF. Seen yesterday dx with UTI. A&OX4.

## 2023-12-08 ENCOUNTER — Encounter: Payer: Self-pay | Admitting: Internal Medicine

## 2023-12-08 ENCOUNTER — Inpatient Hospital Stay (HOSPITAL_COMMUNITY)
Admit: 2023-12-08 | Discharge: 2023-12-08 | Disposition: A | Discharge status: Home / Self Care | Attending: Internal Medicine | Admitting: Internal Medicine

## 2023-12-08 ENCOUNTER — Inpatient Hospital Stay

## 2023-12-08 DIAGNOSIS — I5033 Acute on chronic diastolic (congestive) heart failure: Secondary | ICD-10-CM | POA: Diagnosis not present

## 2023-12-08 DIAGNOSIS — I5031 Acute diastolic (congestive) heart failure: Secondary | ICD-10-CM | POA: Diagnosis not present

## 2023-12-08 LAB — BASIC METABOLIC PANEL WITH GFR
Anion gap: 6 (ref 5–15)
BUN: 10 mg/dL (ref 8–23)
CO2: 36 mmol/L — ABNORMAL HIGH (ref 22–32)
Calcium: 8.5 mg/dL — ABNORMAL LOW (ref 8.9–10.3)
Chloride: 96 mmol/L — ABNORMAL LOW (ref 98–111)
Creatinine, Ser: 0.72 mg/dL (ref 0.44–1.00)
GFR, Estimated: >60 mL/min (ref >60)
Glucose, Bld: 102 mg/dL — ABNORMAL HIGH (ref 70–99)
Potassium: 3.3 mmol/L — ABNORMAL LOW (ref 3.5–5.1)
Sodium: 138 mmol/L (ref 135–145)

## 2023-12-08 LAB — ECHOCARDIOGRAM COMPLETE
AR max vel: 3.62 cm2
AV Area VTI: 3.93 cm2
AV Area mean vel: 3.86 cm2
AV Mean grad: 4 mmHg
AV Peak grad: 7 mmHg
Ao pk vel: 1.32 m/s
Area-P 1/2: 5.34 cm2
Height: 66 in
S' Lateral: 2.3 cm
Weight: 2532.64 [oz_av]

## 2023-12-08 LAB — URINE CULTURE

## 2023-12-08 LAB — GLUCOSE, CAPILLARY: Glucose-Capillary: 117 mg/dL — ABNORMAL HIGH (ref 70–99)

## 2023-12-08 MED ORDER — POTASSIUM CHLORIDE CRYS ER 20 MEQ PO TBCR
40.0000 meq | EXTENDED_RELEASE_TABLET | Freq: Once | ORAL | Status: AC
  Administered 2023-12-08: 40 meq via ORAL
  Filled 2023-12-08: qty 2

## 2023-12-08 MED ORDER — CARVEDILOL 6.25 MG PO TABS
6.2500 mg | ORAL_TABLET | Freq: Once | ORAL | Status: AC
  Administered 2023-12-08: 6.25 mg via ORAL
  Filled 2023-12-08: qty 1

## 2023-12-08 MED ORDER — CARVEDILOL 12.5 MG PO TABS
12.5000 mg | ORAL_TABLET | Freq: Two times a day (BID) | ORAL | Status: DC
  Administered 2023-12-08 – 2023-12-09 (×3): 12.5 mg via ORAL
  Filled 2023-12-08 (×3): qty 1

## 2023-12-08 NOTE — ED Notes (Signed)
 X-ray at bedside

## 2023-12-08 NOTE — Progress Notes (Signed)
*  PRELIMINARY RESULTS* Echocardiogram 2D Echocardiogram has been performed.  Mary [name] 12/08/2023, 8:46 AM

## 2023-12-08 NOTE — ED Notes (Signed)
 CCMD called to place pt on cardiac monitoring.

## 2023-12-08 NOTE — Progress Notes (Signed)
 PROGRESS NOTE    Mary [name]   WUJ:[id number] DOB: 1935/05/14  DOA: 12/07/2023 Date of Service: 12/08/23 which is hospital day 1  PCP: Marisue Ivan, MD    Hospital course / significant events:   HPI:  Mary [name] is a 89 y.o. female with medical history significant of HTN, chronic HFpEF, CAD, hypothyroidism, recurrent nosebleed off dual antiplatelet therapy, question of PAF, presented from home with worsening of cough shortness of breath and peripheral edema. Onset 7 days prior. Came to ED when noting SOB at rest.    04/16: to ED. CXR pulmonary congestion and bilateral pleural effusion, elevated HR and Afib RVR on EKG. Admitted to hospitalist service for acute on chronic HFpEF and Afib RVR. IV lasix, digoxin loading IV also ordered 04/17: HR remains labile, occasionally sustaining 130s-140s. Increase beta blocker. Echo results pending. Cardiology consulted re: continuation digoxin vs other      Consultants:  Cardiology   Procedures/Surgeries: none      ASSESSMENT & PLAN:   Acute hypoxic respiratory failure Due to pulmonary edema and pleural effusion from CHF O2 supplement, wean as able Treat underlying cause(s)  Acute on chronic HFpEF decompensation Question secondary to A-fib with RVR or may be primary/initial problem Treat Afib as noted, including rate control  IV Lasix 40 mg twice daily Hold amlodipine and clonidine as patient in heart failure decompensation. Echocardiogram Strict I&O's   PAF with RVR Question secondary to CHF or may be primary/initial problem With concurrent CHF decompensation, admitting hospitalist initiated start digoxin loading for rate control (avoid Cardizem).  Digoxin 0.125 mg every 6 hours x 4 Increase Coreg dosage to 6.5 mg twice daily yesterday, further increased to 12.5 mg bid today Decrease irbesartan dosage Check magnesium and phosphorus level Replete potassium CHADS2=3, indication for systemic anticoagulation.  However  patient reported that she has had repeated severe nosebleed recently and antiplatelet therapy of dual antiplatelet aspirin Plavix have been held for 2 months.  Will start aspirin, and recommend she discuss the issue of anticoagulation with cardiology as outpatient.  SIRS assoc w/ acute resp fail, Afib, CHF Treat as above No leukocytosis Unlikely sepsis but does have abn UA, see below monitor for urinary symptoms   Abnormal UA  Asymptomatic bacteriuria  VS more c/w SIRS/Afib/CHF rather than sepsis  Reports no symptoms, will not treat  Hypokalemia Replace as needed Monitor BMP  Hypothyroidism TSH WNL, not likely to be contributing to cardiac issues as above  Continue Synthroid   Anxiety/depression Continue Prozac and trazodone at bedtime   IIDM A1c 6.1 Continue diet control   Deconditioning PT evaluation    overweight based on BMI: Body mass index is 25.55 kg/m.  Underweight - under 18  overweight - 25 to 29 obese - 30 or more Class 1 obesity: BMI of 30.0 to 34 Class 2 obesity: BMI of 35.0 to 39 Class 3 obesity: BMI of 40.0 to 49 Super Morbid Obesity: BMI 50-59 Super-super Morbid Obesity: BMI 60+ Significantly low or high BMI is associated with higher medical risk.  Weight management advised as adjunct to other disease management and risk reduction treatments    DVT prophylaxis: pt declines Eliquis / other anticoagulation d/t hx severe recurrent epistaxis IV fluids: no continuous IV fluids  Nutrition: cardiac diet Central lines / other devices: none  Code Status: DNR ACP documentation reviewed:  none on file in [facility]  TOC needs: TBD Medical barriers to dispo: IV rate control and diuresis. Expected medical readiness for discharge no sooner  than 04/19.              Subjective / Brief ROS:  Patient reports breathing and edema are better Denies CP/SOB at rest No palpations  Pain controlled.  Denies new weakness.  Tolerating diet.  Reports no  concerns w/ urination/defecation.   Family Communication: will call daughter later today and addend note if I am able to reach her     Objective Findings:  Vitals:   12/08/23 0800 12/08/23 0830 12/08/23 0930 12/08/23 1004  BP: (!) 140/79 (!) 150/125 95/69   Pulse: (!) 125 (!) 129 (!) 58   Resp: (!) 22 20 (!) 22   Temp:    98.7 F (37.1 C)  TempSrc:    Oral  SpO2: 93% 93% 95%   Weight:      Height:        Intake/Output Summary (Last 24 hours) at 12/08/2023 1035 Last data filed at 12/07/2023 1936 Gross per 24 hour  Intake --  Output 1000 ml  Net -1000 ml   Filed Weights   12/07/23 1255  Weight: 71.8 kg    Examination:  Physical Exam Constitutional:      General: She is not in acute distress. Cardiovascular:     Rate and Rhythm: Tachycardia present. Rhythm irregular.  Pulmonary:     Effort: Pulmonary effort is normal.     Breath sounds: Rales present.  Musculoskeletal:     Right lower leg: Edema present.     Left lower leg: Edema present.     Comments: Pt reports LE edema has improved compared to yesterday   Neurological:     General: No focal deficit present.     Mental Status: She is alert and oriented to person, place, and time.  Psychiatric:        Mood and Affect: Mood normal.        Behavior: Behavior normal.          Scheduled Medications:   aspirin EC  81 mg Oral Daily   busPIRone  10 mg Oral BID   carvedilol  12.5 mg Oral BID WC   enoxaparin (LOVENOX) injection  40 mg Subcutaneous Q24H   Fe Fum-Vit C-Vit B12-FA  1 capsule Oral Daily   furosemide  40 mg Intravenous BID   hydrALAZINE  50 mg Oral TID   irbesartan  300 mg Oral Daily   ketotifen  1 drop Both Eyes BID   levothyroxine  75 mcg Oral Daily   mirabegron ER  25 mg Oral Daily   pantoprazole  20 mg Oral Daily   PARoxetine  40 mg Oral QPM   rosuvastatin  5 mg Oral Daily   sodium chloride flush  3 mL Intravenous Q12H    Continuous Infusions:  sodium chloride      PRN Medications:   sodium chloride, acetaminophen, ondansetron (ZOFRAN) IV, sodium chloride flush, traZODone  Antimicrobials from admission:  Anti-infectives (From admission, onward)    Start     Dose/Rate Route Frequency Ordered Stop   12/07/23 1300  levofloxacin (LEVAQUIN) IVPB 750 mg        750 mg 100 mL/hr over 90 Minutes Intravenous  Once 12/07/23 1254 12/07/23 1459           Data Reviewed:  I have personally reviewed the following...  CBC: Recent Labs  Lab 12/07/23 1242  WBC 6.1  NEUTROABS 4.3  HGB 13.5  HCT 42.9  MCV 100.0  PLT 189   Basic Metabolic Panel: Recent Labs  Lab 12/07/23 1242 12/07/23 1502 12/08/23 0522  NA 136  --  138  K 4.0  --  3.3*  CL 103  --  96*  CO2 23  --  36*  GLUCOSE 170*  --  102*  BUN 12  --  10  CREATININE 0.75  --  0.72  CALCIUM 8.8*  --  8.5*  MG  --  1.9  --   PHOS  --  2.5  --    GFR: Estimated Creatinine Clearance: 51.2 mL/min (by C-G formula based on SCr of 0.72 mg/dL). Liver Function Tests: Recent Labs  Lab 12/07/23 1242  AST 30  ALT 11  ALKPHOS 58  BILITOT 0.6  PROT 6.8  ALBUMIN 3.5   No results for input(s): "LIPASE", "AMYLASE" in the last 168 hours. No results for input(s): "AMMONIA" in the last 168 hours. Coagulation Profile: Recent Labs  Lab 12/07/23 1309  INR 1.1   Cardiac Enzymes: No results for input(s): "CKTOTAL", "CKMB", "CKMBINDEX", "TROPONINI" in the last 168 hours. BNP (last 3 results) No results for input(s): "PROBNP" in the last 8760 hours. HbA1C: No results for input(s): "HGBA1C" in the last 72 hours. CBG: No results for input(s): "GLUCAP" in the last 168 hours. Lipid Profile: No results for input(s): "CHOL", "HDL", "LDLCALC", "TRIG", "CHOLHDL", "LDLDIRECT" in the last 72 hours. Thyroid Function Tests: Recent Labs    12/07/23 1502  TSH 2.976   Anemia Panel: No results for input(s): "VITAMINB12", "FOLATE", "FERRITIN", "TIBC", "IRON", "RETICCTPCT" in the last 72 hours. Most Recent Urinalysis On  File:     Component Value Date/Time   COLORURINE YELLOW (A) 12/07/2023 1309   APPEARANCEUR HAZY (A) 12/07/2023 1309   APPEARANCEUR Hazy (A) 05/09/2023 1300   LABSPEC 1.005 12/07/2023 1309   LABSPEC 1.005 12/29/2013 0119   PHURINE 6.0 12/07/2023 1309   GLUCOSEU NEGATIVE 12/07/2023 1309   GLUCOSEU Negative 12/29/2013 0119   HGBUR SMALL (A) 12/07/2023 1309   BILIRUBINUR NEGATIVE 12/07/2023 1309   BILIRUBINUR Negative 05/09/2023 1300   BILIRUBINUR Negative 12/29/2013 0119   KETONESUR NEGATIVE 12/07/2023 1309   PROTEINUR NEGATIVE 12/07/2023 1309   NITRITE NEGATIVE 12/07/2023 1309   LEUKOCYTESUR LARGE (A) 12/07/2023 1309   LEUKOCYTESUR 1+ 12/29/2013 0119   Sepsis Labs: @LABRCNTIP (procalcitonin:4,lacticidven:4) Microbiology: Recent Results (from the past 240 hours)  Resp panel by RT-PCR (RSV, Flu A&B, Covid) Anterior Nasal Swab     Status: None   Collection Time: 12/07/23  1:09 PM   Specimen: Anterior Nasal Swab  Result Value Ref Range Status   SARS Coronavirus 2 by RT PCR NEGATIVE NEGATIVE Final    Comment: (NOTE) SARS-CoV-2 target nucleic acids are NOT DETECTED.  The SARS-CoV-2 RNA is generally detectable in upper respiratory specimens during the acute phase of infection. The lowest concentration of SARS-CoV-2 viral copies this assay can detect is 138 copies/mL. A negative result does not preclude SARS-Cov-2 infection and should not be used as the sole basis for treatment or other patient management decisions. A negative result may occur with  improper specimen collection/handling, submission of specimen other than nasopharyngeal swab, presence of viral mutation(s) within the areas targeted by this assay, and inadequate number of viral copies(<138 copies/mL). A negative result must be combined with clinical observations, patient history, and epidemiological information. The expected result is Negative.  Fact Sheet for Patients:   BloggerCourse.com  Fact Sheet for Healthcare Providers:  SeriousBroker.it  This test is no t yet approved or cleared by the [place] and  has been authorized  for detection and/or diagnosis of SARS-CoV-2 by FDA under an Emergency Use Authorization (EUA). This EUA will remain  in effect (meaning this test can be used) for the duration of the COVID-19 declaration under Section 564(b)(1) of the Act, 21 U.S.C.section 360bbb-3(b)(1), unless the authorization is terminated  or revoked sooner.       Influenza A by PCR NEGATIVE NEGATIVE Final   Influenza B by PCR NEGATIVE NEGATIVE Final    Comment: (NOTE) The Xpert Xpress SARS-CoV-2/FLU/RSV plus assay is intended as an aid in the diagnosis of influenza from Nasopharyngeal swab specimens and should not be used as a sole basis for treatment. Nasal washings and aspirates are unacceptable for Xpert Xpress SARS-CoV-2/FLU/RSV testing.  Fact Sheet for Patients: BloggerCourse.com  Fact Sheet for Healthcare Providers: SeriousBroker.it  This test is not yet approved or cleared by the [place]  FDA and has been authorized for detection and/or diagnosis of SARS-CoV-2 by FDA under an Emergency Use Authorization (EUA). This EUA will remain in effect (meaning this test can be used) for the duration of the COVID-19 declaration under Section 564(b)(1) of the Act, 21 U.S.C. section 360bbb-3(b)(1), unless the authorization is terminated or revoked.     Resp Syncytial Virus by PCR NEGATIVE NEGATIVE Final    Comment: (NOTE) Fact Sheet for Patients: BloggerCourse.com  Fact Sheet for Healthcare Providers: SeriousBroker.it  This test is not yet approved or cleared by the [place]  FDA and has been authorized for detection and/or diagnosis of SARS-CoV-2 by FDA under an Emergency Use  Authorization (EUA). This EUA will remain in effect (meaning this test can be used) for the duration of the COVID-19 declaration under Section 564(b)(1) of the Act, 21 U.S.C. section 360bbb-3(b)(1), unless the authorization is terminated or revoked.  Performed at [facility], [address], [place], [place] [address]   Blood Culture (routine x 2)     Status: None (Preliminary result)   Collection Time: 12/07/23  1:09 PM   Specimen: BLOOD  Result Value Ref Range Status   Specimen Description   Final    BLOOD Blood Culture results may not be optimal due to an inadequate volume of blood received in culture bottles   Special Requests   Final    BOTTLES DRAWN AEROBIC AND ANAEROBIC RIGHT ANTECUBITAL   Culture   Final    NO GROWTH < 24 HOURS Performed at [facility], [address], [place], [place] [address]    Report Status PENDING  Incomplete  Blood Culture (routine x 2)     Status: None (Preliminary result)   Collection Time: 12/07/23  1:09 PM   Specimen: BLOOD  Result Value Ref Range Status   Specimen Description   Final    BLOOD Blood Culture results may not be optimal due to an inadequate volume of blood received in culture bottles   Special Requests   Final    BOTTLES DRAWN AEROBIC AND ANAEROBIC LEFT ANTECUBITAL   Culture   Final    NO GROWTH < 24 HOURS Performed at [facility], [address], [place], [place] [address]    Report Status PENDING  Incomplete  Urine Culture     Status: None (Preliminary result)   Collection Time: 12/07/23  1:09 PM   Specimen: Urine, Clean Catch  Result Value Ref Range Status   Specimen Description   Final    URINE, CLEAN CATCH Performed at [facility], [address], [place], [place] [address]    Special Requests  Final    NONE Reflexed from [id number] Performed at [facility], [address] [place], [place] [address]    Culture PENDING  Incomplete   Report Status PENDING  Incomplete       Radiology Studies last 3 days: DG Chest 1 View Result Date: 12/08/2023 CLINICAL DATA:  89 year old female with CHF. EXAM: CHEST  1 VIEW COMPARISON:  Portable chest yesterday and earlier. FINDINGS: Portable AP semi upright view at 0650 hours. Stable to mildly improved lung volumes. Stable cardiac size and mediastinal contours. Calcified aortic atherosclerosis. Layering pleural effusions have not significantly changed, small to moderate on the left and small on the right. No superimposed pneumothorax, consolidation. No overt edema, and pulmonary vascularity appears regressed. Stable visualized osseous structures.  Paucity of bowel gas. IMPRESSION: Regressed/resolved pulmonary vascular congestion since yesterday with stable small to moderate pleural effusions. Electronically Signed   By: Marlise Simpers M.D.   On: 12/08/2023 07:03   DG Chest Port 1 View Result Date: 12/07/2023 CLINICAL DATA:  Questionable sepsis - evaluate for abnormality EXAM: PORTABLE CHEST 1 VIEW COMPARISON:  09/17/2021. FINDINGS: There are diffuse mild-to-moderately increased interstitial markings. There is left retrocardiac airspace opacity obscuring the left hemidiaphragm, descending thoracic aorta and blunting the left lateral costophrenic angle, suggesting combination of left lung atelectasis and/or consolidation with pleural effusion. Bilateral lungs are otherwise clear. There is blunting of right lateral costophrenic angle suggesting small right pleural effusion. Stable cardio-mediastinal silhouette. No acute osseous abnormalities. The soft tissues are within normal limits. IMPRESSION: 1. Findings favor congestive heart failure/pulmonary edema. 2. There is left retrocardiac opacity, as described above. 3. Small bilateral pleural effusions. Electronically Signed   By: Beula Brunswick M.D.   On: 12/07/2023 13:35       Time spent: 35 min     Melodi Sprung, DO [facility] 12/08/2023, 10:35 AM    Dictation software may  have been used to generate the above note. Typos may occur and escape review in typed/dictated notes. Please contact Dr Authur Leghorn directly for clarity if needed.  Staff may message me via secure chat in Epic  but this may not receive an immediate response,  please page me for urgent matters!  If 7PM-7AM, please contact night coverage [email or web address]

## 2023-12-08 NOTE — Hospital Course (Addendum)
 Hospital course / significant events:   HPI:  Mary [name] is a 89 y.o. female with medical history significant of HTN, chronic HFpEF, CAD, hypothyroidism, recurrent nosebleed off dual antiplatelet therapy, question of PAF, presented from home with worsening of cough shortness of breath and peripheral edema. Onset 7 days prior. Came to ED when noting SOB at rest.    04/16: to ED. CXR pulmonary congestion and bilateral pleural effusion, elevated HR and Afib RVR on EKG. Admitted to hospitalist service for acute on chronic HFpEF and Afib RVR. IV lasix , digoxin  loading IV also ordered 04/17: HR remains labile, occasionally sustaining 130s-140s. Increase beta blocker. Echo results pending. Cardiology consulted re: continuation digoxin  vs other, HR responding to beta blocker so will not continue digoxin  04/18: thoracentesis removed 150 mL. HR still up a bit, monitoring  04/19: transition to po lasix , coreg  increased again today and HR is improved. Restarted Eliquis , dc ASA per cardiology. 04/20: improving on increased beta blocker, cardiology states ok to dc from their standpoint, likely for dc tomorrow once we can arrange DME/HH  04/21: pt still weak and worse compared to yesterday, she is now amenable to SNF rehab, TOC informed  04/22: placement in process. Reduced dose BP meds given some dizziness / soft BP but HR has been at goal.      Consultants:  Cardiology  Palliative care   Procedures/Surgeries: 12/09/23 L thoracentesis      ASSESSMENT & PLAN:   Acute hypoxic respiratory failure Due to pulmonary edema and pleural effusion from CHF O2 supplement, wean as able Treat underlying cause(s)  L Pleural effusion - noted large on Echo, moderate on CXR Decreased lung sounds L lung base likely effusion Thoracentesis 04/18 removed 150 mL L chest   Acute on chronic HFpEF decompensation Essential HTN Question secondary to A-fib with RVR or may be primary/initial problem Echocardiogram  --> EF 60-65%, no RWMA, indeterminate diastolic parameters but suspect some diastolic dysfunction given clinical CHF  Treat Afib as noted above Furosemide  40 mg po bid  Coreg  25 mg bid Irbesartan  300 mg daily --> reduced to 150 mg daily d/t soft BP Hydralazine  50 mg 3 times daily --> holding d/t soft BP Aspirin  81 mg daily d/c, restart Eliquis  5 mg bid per cardiology  Crestor  5 mg daily. Strict I&O's   PAF with RVR RVR resolved  Question secondary to CHF or may be primary/initial problem Coreg  25 mg bid is controlling rate   Follow potassium, magnesium  and phosphorus levels and correct electrolytes as needed CHADS2VASC = 7, indication for systemic anticoagulation.  However patient reported that she has had repeated severe nosebleed recently and antiplatelet therapy of dual antiplatelet aspirin  Plavix  have been held for 2 months. Aspirin  81 mg daily d/c and restart Eliquis  5 mg bid per cardiology    SIRS assoc w/ acute resp fail, Afib, CHF - resolved  Treat as above No leukocytosis/infection, no sepsis   Abnormal UA  Asymptomatic bacteriuria  UCx no growth VS more c/w SIRS/Afib/CHF rather than sepsis, denies urinary symptoms, will not treat Follow UCx --> no growth   Hypokalemia Replace as needed  Monitor BMP  Hypothyroidism TSH WNL, not likely to be contributing to cardiac issues as above  Continue Synthroid    Anxiety/depression Continue Prozac and trazodone  at bedtime   IIDM A1c 6.1 Continue diet control   Deconditioning PT evaluation - pt very weak and likely inadequate reserve, she is now amenable to SNF rehab Placement in process     overweight  based on BMI: Body mass index is 25.55 kg/m.  Underweight - under 18  overweight - 25 to 29 obese - 30 or more Class 1 obesity: BMI of 30.0 to 34 Class 2 obesity: BMI of 35.0 to 39 Class 3 obesity: BMI of 40.0 to 49 Super Morbid Obesity: BMI 50-59 Super-super Morbid Obesity: BMI 60+ Significantly low or high BMI is  associated with higher medical risk.  Weight management advised as adjunct to other disease management and risk reduction treatments    DVT prophylaxis: Eliquis   IV fluids: no continuous IV fluids  Nutrition: cardiac diet Central lines / other devices: none  Code Status: DNR ACP documentation reviewed:  none on file in [facility]  [facility] needs: SNF rehab Medical barriers to dispo: none, cardiology has cleared, pending other complications can go once SNF rehab arranged

## 2023-12-08 NOTE — Evaluation (Signed)
 Physical Therapy Evaluation Patient Details Name: Mary [name] MRN: [id number] DOB: [birth year] Today's Date: 12/08/2023  History of Present Illness  Pt is an 89 y/o F admitted on 12/07/23 after presenting with worsening cough, SOB & peripheral edema. Pt found to have B pleural effusion, elevated HR, & a-fib with RVR. Pt is being treated for acute on chronic HFpEF & a-fib with RVR. PMH: HTN, chronic HFpEF, CAD, hypothyroidism, recurrent nosebleed, question of PAF, asthma, c diff infection, lumbar DDD, depression, DM2, diabetic retinopathy, heart murmur, HLD, RA, sleep apnea, spinal stenosis  Clinical Impression  Pt seen for PT evaluation with pt received in bed, agreeable to tx. Pt reports prior to admission she was living with her daughter (daughter has CP & pt is her primary caregiver), ambulatory with hurrycane or RW, denies falls. On this date, pt is able to complete bed mobility with supervision, HOB elevated, bed rails & extra time. Pt transfers sit<>stand with min assist, with extra time to power up. Pt is able to ambulate around bed & back with HHA or no AD with min assist 2/2 unsteadiness. PT educated pt on need to use RW at this time to prevent falls & pt agreeable. Pt also eager to go home to be with daughter vs go to SNF rehab, is open to HHPT. Will continue to follow pt acutely to progress independence with mobility. Max HR 142 bpm.        If plan is discharge home, recommend the following: A little help with walking and/or transfers;A little help with bathing/dressing/bathroom;Assistance with cooking/housework;Assist for transportation;Help with stairs or ramp for entrance   Can travel by private vehicle        Equipment Recommendations BSC/3in1  Recommendations for Other Services       Functional Status Assessment Patient has had a recent decline in their functional status and demonstrates the ability to make significant improvements in function in a reasonable and predictable  amount of time.     Precautions / Restrictions Precautions Precautions: Fall Restrictions Weight Bearing Restrictions Per Provider Order: No      Mobility  Bed Mobility Overal bed mobility: Needs Assistance Bed Mobility: Supine to Sit     Supine to sit: Supervision, HOB elevated, Used rails (extra time, exit R side of bed)          Transfers Overall transfer level: Needs assistance Equipment used: 1 person hand held assist Transfers: Sit to/from Stand Sit to Stand: Min assist           General transfer comment: STS from EOB, BSC    Ambulation/Gait Ambulation/Gait assistance: Min assist, Contact guard assist Gait Distance (Feet): 10 Feet (+ 10 ft) Assistive device: 1 person hand held assist, None Gait Pattern/deviations: Decreased step length - right, Decreased step length - left, Decreased stride length, Step-through pattern Gait velocity: decreased     General Gait Details: Pt ambulates around bed & back with HHA, pt reaching for furniture for support, noting she does this at home. When pt ambulates without HHA pt requires min assist 2/2 decreased balance.  Stairs            Wheelchair Mobility     Tilt Bed    Modified Rankin (Stroke Patients Only)       Balance Overall balance assessment: Needs assistance Sitting-balance support: Feet supported Sitting balance-Leahy Scale: Fair     Standing balance support: During functional activity, No upper extremity supported Standing balance-Leahy Scale: Poor  Pertinent Vitals/Pain Pain Assessment Pain Assessment: No/denies pain    Home Living Family/patient expects to be discharged to:: Private residence Living Arrangements: Children Available Help at Discharge: Family;Available PRN/intermittently Type of Home: House Home Access: Stairs to enter Entrance Stairs-Rails: Can reach both;Right;Left Entrance Stairs-Number of Steps: 3-4   Home Layout: One  level Home Equipment: Pharmacist, hospital (2 wheels);Rollator (4 wheels) (hurrycane)      Prior Function               Mobility Comments: 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 Pt reports she's ambulatory with hurrycane or RW, denies falls, reports she drove last ~3 weeks ago. ADLs Comments: Independent     Extremity/Trunk Assessment   Upper Extremity Assessment Upper Extremity Assessment: Overall WFL for tasks assessed    Lower Extremity Assessment Lower Extremity Assessment: Generalized weakness       Communication   Communication Communication: No apparent difficulties    Cognition Arousal: Alert Behavior During Therapy: WFL for tasks assessed/performed   PT - Cognitive impairments: No apparent impairments                         Following commands: Intact       Cueing Cueing Techniques: Verbal cues     General Comments General comments (skin integrity, edema, etc.): continent void on BSC, performs peri hygiene without assistance, standing hand hygiene with min assist for balance, max HR 142 bpm, pt does endorse feeling a little woozy upon initial supine>sit    Exercises     Assessment/Plan    PT Assessment Patient needs continued PT services  PT Problem List Decreased strength;Decreased activity tolerance;Decreased balance;Decreased mobility;Decreased knowledge of use of DME       PT Treatment Interventions DME instruction;Balance training;Gait training;Neuromuscular re-education;Stair training;Functional mobility training;Therapeutic activities;Patient/family education;Therapeutic exercise;Manual techniques    PT Goals (Current goals can be found in the Care Plan section)  Acute Rehab PT Goals Patient Stated Goal: go home PT Goal Formulation: With patient Time For Goal Achievement: 12/22/23 Potential to Achieve  Goals: Good    Frequency Min 3X/week     Co-evaluation               AM-PAC PT "6 Clicks" Mobility  Outcome Measure Help needed turning from your back to your side while in a flat bed without using bedrails?: None Help needed moving from lying on your back to sitting on the side of a flat bed without using bedrails?: A Little Help needed moving to and from a bed to a chair (including a wheelchair)?: A Little Help needed standing up from a chair using your arms (e.g., wheelchair or bedside chair)?: A Little Help needed to walk in hospital room?: A Little Help needed climbing 3-5 steps with a railing? : A Little 6 Click Score: 19    End of Session   Activity Tolerance: Patient tolerated treatment well Patient left: in chair;with call bell/phone within reach Nurse Communication: Mobility status (need for green box chair alarm (unable to find one in pt's room or at nurses station)) PT Visit Diagnosis: Muscle weakness (generalized) (M62.81);Unsteadiness on feet (R26.81)    Time: 1191-4782 PT Time Calculation (min) (ACUTE ONLY): 14 min   Charges:   PT Evaluation $PT Eval Moderate Complexity: 1 Mod   PT General Charges $$ ACUTE PT VISIT:  1 Visit         Emaline Handsome, PT, DPT 12/08/23, 3:49 PM   Venetta Gill 12/08/2023, 3:47 PM

## 2023-12-08 NOTE — Consult Note (Addendum)
 [facility] CLINIC CARDIOLOGY CONSULT NOTE       [name] MRN: [id number] DOB/AGE: 87-Apr-1936 87 y.o.  Admit date: 12/07/2023 Referring Physician Dr. Sunnie Nielsen Primary Physician Marisue Ivan, MD  Primary Cardiologist Dr. Lorelle Gibbs Reason for Consultation AF RVR, heart failure  HPI: Mary [name] is a 89 y.o. female  with a past medical history of coronary artery disease by CT scan, peripheral vascular disease s/p L CEApoorly controlled HTN, hyperlipidemia, type 2 diabetes, pulmonary hypertension who presented to the ED on 12/07/2023 for SOB and worsening edema. Found to be in AF RVR. Cardiology was consulted for further evaluation.   Patient reports that over the last 1 months she has had worsening shortness of breath, dyspnea on exertion and lower extremity edema.  Given her persistent symptoms she decided to come to the ED for further evaluation.  Workup in the ED notable for creatinine 0.75, potassium 4.0, sodium 136, hemoglobin 13.5, WBC 6.1.  Procalcitonin negative.  Lactic acid normal at 1.4.  Troponins trended 10, 8.  BNP elevated at 520.  Chest x-ray consistent with pulmonary edema.  UA consistent with UTI.  She was started on IV antibiotics and IV Lasix in the ED.  Also found to be in atrial fibrillation RVR on EKG and was given IV digoxin for rate control.  At the time of my evaluation this afternoon patient is resting comfortably in hospital bed.  We discussed her symptoms in further detail.  She states that her shortness of breath progressively got worse over the last month with associated lower extremity edema.  She denies any chest pain but does report palpitations, dizziness and lightheadedness.  She has not had any syncopal events.  She states that her legs are also tender.  Denies any similar episodes in the past.  Review of systems complete and found to be negative unless listed above    Past Medical History:  Diagnosis Date   Acid reflux    Anemia     Arthritis    Asthma    Atherosclerosis of abdominal aorta (HCC)    B12 deficiency    Clostridium difficile infection    H/O   Coronary artery disease    DDD (degenerative disc disease), lumbar    Depression    Diabetes (HCC)    type 2   Diabetic retinopathy (HCC)    Dysrhythmia    Heart murmur    HLD (hyperlipidemia)    HTN (hypertension)    Hypotension    when get up too quickly   Hypothyroidism    Pneumonia    PONV (postoperative nausea and vomiting)    Pulmonary nodule    right middle lobe on CT scan   Pure hypercholesterolemia    RA (rheumatoid arthritis) (HCC)    Sleep apnea    Spinal stenosis    Urinary urgency     Past Surgical History:  Procedure Laterality Date   ABDOMINAL HYSTERECTOMY     CATARACT EXTRACTION, BILATERAL     COLONOSCOPY N/A 07/29/2021   Procedure: COLONOSCOPY;  Surgeon: Toledo, Boykin Nearing, MD;  Location: [facility];  Service: Gastroenterology;  Laterality: N/A;   CYSTOSCOPY     ENDARTERECTOMY Right 12/02/2021   Procedure: ENDARTERECTOMY CAROTID;  Surgeon: Annice Needy, MD;  Location: [facility] ORS;  Service: Vascular;  Laterality: Right;   ESOPHAGOGASTRODUODENOSCOPY N/A 07/29/2021   Procedure: ESOPHAGOGASTRODUODENOSCOPY (EGD);  Surgeon: Toledo, Boykin Nearing, MD;  Location: [facility] END[facility]COPY;  Service: Gastroenterology;  Laterality: N/A;  DM  HIP ARTHROPLASTY Right 09/24/2015   Procedure: ARTHROPLASTY BIPOLAR HIP (HEMIARTHROPLASTY);  Surgeon: Christena Flake, MD;  Location: [facility] ORS;  Service: Orthopedics;  Laterality: Right;   HIP ARTHROPLASTY Left 09/29/2018   Procedure: ARTHROPLASTY BIPOLAR HIP (HEMIARTHROPLASTY) LEFT;  Surgeon: Christena Flake, MD;  Location: [facility] ORS;  Service: Orthopedics;  Laterality: Left;   KNEE ARTHROSCOPY W/ AUTOGENOUS CARTILAGE IMPLANTATION (ACI) PROCEDURE     TOTAL KNEE ARTHROPLASTY Left 12/01/2017   Procedure: TOTAL KNEE ARTHROPLASTY;  Surgeon: Christena Flake, MD;  Location: [facility] ORS;  Service: Orthopedics;  Laterality: Left;    TOTAL KNEE ARTHROPLASTY Right 2014   VARICOSE VEIN SURGERY Left    leg    Medications Prior to Admission  Medication Sig Dispense Refill Last Dose/Taking   amLODipine (NORVASC) 10 MG tablet Take 1 tablet (10 mg total) by mouth daily. 30 tablet 0 Taking   azelastine (OPTIVAR) 0.05 % ophthalmic solution Place 1 drop into both eyes daily as needed (allergies).   Taking As Needed   busPIRone (BUSPAR) 10 MG tablet Take 10 mg by mouth 2 (two) times daily.   Taking   carvedilol (COREG) 6.25 MG tablet Take 6.25 mg by mouth 2 (two) times daily with a meal.   Taking   clopidogrel (PLAVIX) 75 MG tablet Take 1 tablet (75 mg total) by mouth daily at 6 (six) AM. 30 tablet 11 Taking   levothyroxine (SYNTHROID, LEVOTHROID) 75 MCG tablet Take 75 mcg by mouth daily.    Taking   metFORMIN (GLUCOPHAGE-XR) 500 MG 24 hr tablet Take 500 mg by mouth daily. With dinner   Taking   pantoprazole (PROTONIX) 20 MG tablet Take 20 mg by mouth daily.   Taking   PARoxetine (PAXIL) 40 MG tablet Take 40 mg by mouth every evening.   Taking   rosuvastatin (CRESTOR) 5 MG tablet Take 1 tablet (5 mg total) by mouth daily. 30 tablet 0 Taking   traZODone (DESYREL) 50 MG tablet Take 1 tablet (50 mg total) by mouth at bedtime as needed for sleep. (Patient taking differently: Take 25 mg by mouth at bedtime as needed for sleep.) 30 tablet 0 Taking Differently   acetaminophen (TYLENOL) 500 MG tablet Take 500 mg by mouth every 6 (six) hours as needed for mild pain (pain score 1-3).      aspirin EC 81 MG EC tablet Take 1 tablet (81 mg total) by mouth daily. Swallow whole. 30 tablet 11    cefUROXime (CEFTIN) 500 MG tablet Take 1 tablet (500 mg total) by mouth 2 (two) times daily with a meal. 8 tablet 0    ciprofloxacin (CIPRO) 500 MG tablet Take 1 tablet by mouth 2 (two) times daily.      cloNIDine (CATAPRES) 0.1 MG tablet Take 0.1 mg by mouth 2 (two) times daily.      hydrALAZINE (APRESOLINE) 100 MG tablet Take 1 tablet (100 mg total) by  mouth 3 (three) times daily. (Patient taking differently: Take 50 mg by mouth 3 (three) times daily.) 90 tablet 0    Iron-Vitamin C (VITRON-C) 65-125 MG TABS Take 1 tablet by mouth daily. 90 tablet 1    valsartan (DIOVAN) 320 MG tablet Take 320 mg by mouth daily.      Vibegron (GEMTESA) 75 MG TABS Take 1 tablet (75 mg total) by mouth daily at 6 (six) AM.      vitamin B-12 (CYANOCOBALAMIN) 500 MCG tablet Take 500 mcg by mouth every other day.      Social History  Socioeconomic History   Marital status: Widowed    Spouse name: Not on file   Number of children: Not on file   Years of education: Not on file   Highest education level: Not on file  Occupational History   Occupation: retired  Tobacco Use   Smoking status: Never   Smokeless tobacco: Never  Vaping Use   Vaping status: Never Used  Substance and Sexual Activity   Alcohol use: No    Alcohol/week: 0.0 standard drinks of alcohol   Drug use: No   Sexual activity: Not Currently  Other Topics Concern   Not on file  Social History Narrative   Lives with daughter   Social Drivers of Health   Financial Resource Strain: Low Risk  (09/28/2023)   Received from [facility]   Overall Financial Resource Strain (CARDIA)    Difficulty of Paying Living Expenses: Not hard at all  Food Insecurity: No Food Insecurity (12/07/2023)   Hunger Vital Sign    Worried About Running Out of Food in the Last Year: Never true    Ran Out of Food in the Last Year: Never true  Transportation Needs: No Transportation Needs (12/07/2023)   PRAPARE - Administrator, Civil Service (Medical): No    Lack of Transportation (Non-Medical): No  Physical Activity: Not on file  Stress: Not on file  Social Connections: Moderately Isolated (12/07/2023)   Social Connection and Isolation Panel [NHANES]    Frequency of Communication with Friends and Family: More than three times a week    Frequency of Social Gatherings with Friends and  Family: More than three times a week    Attends Religious Services: 1 to 4 times per year    Active Member of [employer] or Organizations: No    Attends Banker Meetings: Never    Marital Status: Widowed  Intimate Partner Violence: Not At Risk (12/07/2023)   Humiliation, Afraid, Rape, and Kick questionnaire    Fear of Current or Ex-Partner: No    Emotionally Abused: No    Physically Abused: No    Sexually Abused: No    Family History  Problem Relation Age of Onset   Kidney disease Brother        also nephew   Heart disease Mother    Heart disease Father    Prostate cancer Neg Hx    Bladder Cancer Neg Hx    Breast cancer Neg Hx    Kidney cancer Neg Hx      Vitals:   12/08/23 0930 12/08/23 1004 12/08/23 1030 12/08/23 1153  BP: 95/69  (!) 120/95 117/73  Pulse: (!) 58  95 93  Resp: (!) 22  (!) 22 18  Temp:  98.7 F (37.1 C)  98.3 F (36.8 C)  TempSrc:  Oral  Oral  SpO2: 95%  97% 93%  Weight:      Height:        PHYSICAL EXAM General: Well appearing elderly female, well nourished, in no acute distress. HEENT: Normocephalic and atraumatic. Neck: No JVD.  Lungs: Normal respiratory effort on 2L [place]. Clear bilaterally to auscultation. No wheezes, crackles, rhonchi.  Heart: Irregularly irregular, borderline rate. Normal S1 and S2 without gallops or murmurs.  Abdomen: Non-distended appearing.  Msk: Normal strength and tone for age. Extremities: Warm and well perfused. No clubbing, cyanosis. 1+ pitting edema with erythema and tenderness bilaterally.  Neuro: Alert and oriented X 3. Psych: Answers questions appropriately.   Labs: Basic Metabolic  Panel: Recent Labs    12/07/23 1242 12/07/23 1502 12/08/23 0522  NA 136  --  138  K 4.0  --  3.3*  CL 103  --  96*  CO2 23  --  36*  GLUCOSE 170*  --  102*  BUN 12  --  10  CREATININE 0.75  --  0.72  CALCIUM 8.8*  --  8.5*  MG  --  1.9  --   PHOS  --  2.5  --    Liver Function Tests: Recent Labs    12/07/23 1242   AST 30  ALT 11  ALKPHOS 58  BILITOT 0.6  PROT 6.8  ALBUMIN 3.5   No results for input(s): "LIPASE", "AMYLASE" in the last 72 hours. CBC: Recent Labs    12/07/23 1242  WBC 6.1  NEUTROABS 4.3  HGB 13.5  HCT 42.9  MCV 100.0  PLT 189   Cardiac Enzymes: Recent Labs    12/07/23 1242 12/07/23 1502  TROPONINIHS 10 8   BNP: Recent Labs    12/07/23 1309  BNP 520.4*   D-Dimer: No results for input(s): "DDIMER" in the last 72 hours. Hemoglobin A1C: No results for input(s): "HGBA1C" in the last 72 hours. Fasting Lipid Panel: No results for input(s): "CHOL", "HDL", "LDLCALC", "TRIG", "CHOLHDL", "LDLDIRECT" in the last 72 hours. Thyroid Function Tests: Recent Labs    12/07/23 1502  TSH 2.976   Anemia Panel: No results for input(s): "VITAMINB12", "FOLATE", "FERRITIN", "TIBC", "IRON", "RETICCTPCT" in the last 72 hours.   Radiology: ECHOCARDIOGRAM COMPLETE Result Date: 12/08/2023    ECHOCARDIOGRAM REPORT   Patient Name:   Mary [name] Date of Exam: 12/08/2023 Medical Rec #:  [id number]     Height:       66.0 in Accession #:    [id number]    Weight:       158.3 lb Date of Birth:  Aug 06, 1935     BSA:          1.811 m Patient Age:    88 years      BP:           141/74 mmHg Patient Gender: F             HR:           106 bpm. Exam Location:  [facility] Procedure: 2D Echo, Cardiac Doppler and Color Doppler (Both Spectral and Color            Flow Doppler were utilized during procedure). Indications:     CHF--acute diastolic I50.31  History:         Patient has prior history of Echocardiogram examinations, most                  recent 09/18/2021. CAD, Signs/Symptoms:Murmur; Risk                  Factors:Hypertension.  Sonographer:     Broadus Canes Referring Phys:  [id number] [place] Diagnosing Phys: Lanell Pinta Custovic IMPRESSIONS  1. Left ventricular ejection fraction, by estimation, is 60 to 65%. The left ventricle has normal function. The left ventricle has no regional wall motion abnormalities.  Left ventricular diastolic parameters are indeterminate.  2. Right ventricular systolic function is normal. The right ventricular size is normal.  3. Left atrial size was mildly dilated.  4. Large pleural effusion.  5. The mitral valve is normal in structure. Trivial mitral valve regurgitation. No evidence of mitral stenosis.  6. The aortic valve  is normal in structure. Aortic valve regurgitation is not visualized. No aortic stenosis is present.  7. The inferior vena cava is normal in size with greater than 50% respiratory variability, suggesting right atrial pressure of 3 mmHg. FINDINGS  Left Ventricle: Left ventricular ejection fraction, by estimation, is 60 to 65%. The left ventricle has normal function. The left ventricle has no regional wall motion abnormalities. The left ventricular internal cavity size was normal in size. There is  no left ventricular hypertrophy. Left ventricular diastolic parameters are indeterminate. Right Ventricle: The right ventricular size is normal. No increase in right ventricular wall thickness. Right ventricular systolic function is normal. Left Atrium: Left atrial size was mildly dilated. Right Atrium: Right atrial size was normal in size. Pericardium: There is no evidence of pericardial effusion. Mitral Valve: The mitral valve is normal in structure. Trivial mitral valve regurgitation. No evidence of mitral valve stenosis. Tricuspid Valve: The tricuspid valve is normal in structure. Tricuspid valve regurgitation is mild. Aortic Valve: The aortic valve is normal in structure. Aortic valve regurgitation is not visualized. No aortic stenosis is present. Aortic valve mean gradient measures 4.0 mmHg. Aortic valve peak gradient measures 7.0 mmHg. Aortic valve area, by VTI measures 3.93 cm. Pulmonic Valve: The pulmonic valve was normal in structure. Pulmonic valve regurgitation is not visualized. Aorta: The aortic root is normal in size and structure. Venous: The inferior vena cava is  normal in size with greater than 50% respiratory variability, suggesting right atrial pressure of 3 mmHg. IAS/Shunts: No atrial level shunt detected by color flow Doppler. Additional Comments: There is a large pleural effusion.  LEFT VENTRICLE PLAX 2D LVIDd:         3.70 cm LVIDs:         2.30 cm LV PW:         0.80 cm LV IVS:        1.60 cm LVOT diam:     2.00 cm LV SV:         77 LV SV Index:   42 LVOT Area:     3.14 cm  RIGHT VENTRICLE RV Basal diam:  3.60 cm RV Mid diam:    3.20 cm LEFT ATRIUM             Index        RIGHT ATRIUM           Index LA diam:        4.40 cm 2.43 cm/m   RA Area:     14.40 cm LA Vol (A2C):   41.4 ml 22.87 ml/m  RA Volume:   31.70 ml  17.51 ml/m LA Vol (A4C):   39.4 ml 21.76 ml/m LA Biplane Vol: 44.1 ml 24.36 ml/m  AORTIC VALVE AV Area (Vmax):    3.62 cm AV Area (Vmean):   3.86 cm AV Area (VTI):     3.93 cm AV Vmax:           132.00 cm/s AV Vmean:          87.100 cm/s AV VTI:            0.195 m AV Peak Grad:      7.0 mmHg AV Mean Grad:      4.0 mmHg LVOT Vmax:         152.00 cm/s LVOT Vmean:        107.000 cm/s LVOT VTI:          0.244 m LVOT/AV VTI ratio: 1.25  AORTA Ao Root diam: 3.40 cm MITRAL VALVE                TRICUSPID VALVE MV Area (PHT): 5.34 cm     TR Peak grad:   34.1 mmHg MV Decel Time: 142 msec     TR Vmax:        292.00 cm/s MV E velocity: 112.00 cm/s                             SHUNTS                             Systemic VTI:  0.24 m                             Systemic Diam: 2.00 cm Clotilde Dieter Electronically signed by Clotilde Dieter Signature Date/Time: 12/08/2023/12:14:23 PM    Final    DG Chest 1 View Result Date: 12/08/2023 CLINICAL DATA:  89 year old female with CHF. EXAM: CHEST  1 VIEW COMPARISON:  Portable chest yesterday and earlier. FINDINGS: Portable AP semi upright view at 0650 hours. Stable to mildly improved lung volumes. Stable cardiac size and mediastinal contours. Calcified aortic atherosclerosis. Layering pleural effusions have not  significantly changed, small to moderate on the left and small on the right. No superimposed pneumothorax, consolidation. No overt edema, and pulmonary vascularity appears regressed. Stable visualized osseous structures.  Paucity of bowel gas. IMPRESSION: Regressed/resolved pulmonary vascular congestion since yesterday with stable small to moderate pleural effusions. Electronically Signed   By: Odessa Fleming M.D.   On: 12/08/2023 07:03   DG Chest Port 1 View Result Date: 12/07/2023 CLINICAL DATA:  Questionable sepsis - evaluate for abnormality EXAM: PORTABLE CHEST 1 VIEW COMPARISON:  09/17/2021. FINDINGS: There are diffuse mild-to-moderately increased interstitial markings. There is left retrocardiac airspace opacity obscuring the left hemidiaphragm, descending thoracic aorta and blunting the left lateral costophrenic angle, suggesting combination of left lung atelectasis and/or consolidation with pleural effusion. Bilateral lungs are otherwise clear. There is blunting of right lateral costophrenic angle suggesting small right pleural effusion. Stable cardio-mediastinal silhouette. No acute osseous abnormalities. The soft tissues are within normal limits. IMPRESSION: 1. Findings favor congestive heart failure/pulmonary edema. 2. There is left retrocardiac opacity, as described above. 3. Small bilateral pleural effusions. Electronically Signed   By: Jules Schick M.D.   On: 12/07/2023 13:35    ECHO pending  TELEMETRY reviewed by me 12/08/2023: atrial fibrillation rate 90-100s  EKG reviewed by me: AF RVR rate 112 bpm  Data reviewed by me 12/08/2023: last 24h vitals tele labs imaging I/O ED provider note, admission H&P  Principal Problem:   CHF (congestive heart failure) (HCC) Active Problems:   Acute respiratory failure with hypoxia (HCC)   Acute on chronic diastolic CHF (congestive heart failure) (HCC)   A-fib (HCC)    ASSESSMENT AND PLAN:  Mary [name] is a 89 y.o. female  with a past medical  history of coronary artery disease by CT scan, peripheral vascular disease s/p L CEA poorly controlled HTN, hyperlipidemia, type 2 diabetes, pulmonary hypertension who presented to the ED on 12/07/2023 for SOB and worsening edema. Found to be in AF RVR. Cardiology was consulted for further evaluation.   # Atrial fibrillation RVR # Presumed heart failure preserved EF Patient with worsening shortness of breath and lower extremity edema with elevated  BNP of 520.  Chest x-ray consistent with pulmonary edema, improved today.  Also in atrial fibrillation RVR on initial EKG, right now improved. -Echo pending.  Further recommendations for medications pending echo results. -Continue IV Lasix 40 mg twice daily.  -Coreg increased this morning to 12.5 mg twice daily by primary team.  Will defer additional digoxin. -Continue irbesartan 300 mg daily, hydralazine 50 mg 3 times daily, Coreg as above. -Continue aspirin 81 mg daily, Crestor 5 mg daily. -Patient has history of severe nose bleeds and DAPT has been held for 2 months. CHA?DS?-VASc score is 7 with age, gender, Hx of CHF, PVD, and T2DM would recommend long-term anticoagulation. Due to patients high risk of recurrent nose bleeds will reassess with patient tomorrow. Today patients Hgb and platelets are stable.    This patient's plan of care was discussed and created with Dr. Custovic and she is in agreement.  Signed: Hamp Levine, PA-C  12/08/2023, 12:14 PM [facility] Cardiology

## 2023-12-08 NOTE — Progress Notes (Signed)
 Heart Failure Navigator Progress Note  Assessed for Heart & Vascular TOC clinic readiness.  Does not meet criteria due to current [facility] patient.   Navigator will sign of at this time.  Celedonio Coil, RN, BSN [facility] Heart Failure Navigator Secure Chat Only

## 2023-12-08 NOTE — Plan of Care (Signed)

## 2023-12-08 NOTE — Progress Notes (Signed)
 PROGRESS NOTE    Mary [name]   WUJ:[id number] DOB: 1935/01/20  DOA: 12/07/2023 Date of Service: 12/08/23 which is hospital day 1  PCP: Monique Ano, MD    Hospital course / significant events:   HPI:  Mary [name] is a 89 y.o. female with medical history significant of HTN, chronic HFpEF, CAD, hypothyroidism, recurrent nosebleed off dual antiplatelet therapy, question of PAF, presented from home with worsening of cough shortness of breath and peripheral edema. Onset 7 days prior. Came to ED when noting SOB at rest.    04/16: to ED. CXR pulmonary congestion and bilateral pleural effusion, elevated HR and Afib RVR on EKG. Admitted to hospitalist service for acute on chronic HFpEF and Afib RVR. IV lasix, digoxin loading IV also ordered 04/17: HR remains labile, occasionally sustaining 130s-140s. Increase beta blocker. Echo results pending. Cardiology consulted re: continuation digoxin vs other      Consultants:  Cardiology   Procedures/Surgeries: none      ASSESSMENT & PLAN:   Acute hypoxic respiratory failure Due to pulmonary edema and pleural effusion from CHF O2 supplement, wean as able Treat underlying cause(s)  Acute on chronic HFpEF decompensation Question secondary to A-fib with RVR or may be primary/initial problem Treat Afib as noted, including rate control  IV Lasix 40 mg twice daily Hold amlodipine and clonidine as patient in heart failure decompensation. Echocardiogram Strict I&O's   PAF with RVR Question secondary to CHF or may be primary/initial problem With concurrent CHF decompensation, admitting hospitalist initiated start digoxin loading for rate control (avoid Cardizem).  Digoxin 0.125 mg every 6 hours x 4 Increase Coreg dosage to 6.5 mg twice daily yesterday, further increased to 12.5 mg bid today Decrease irbesartan dosage Check magnesium and phosphorus level Replete potassium CHADS2=3, indication for systemic anticoagulation.  However  patient reported that she has had repeated severe nosebleed recently and antiplatelet therapy of dual antiplatelet aspirin Plavix have been held for 2 months.  Will start aspirin, and recommend she discuss the issue of anticoagulation with cardiology as outpatient.  SIRS assoc w/ acute resp fail, Afib, CHF Treat as above No leukocytosis Unlikely sepsis but does have abn UA, see below monitor for urinary symptoms   Abnormal UA  Asymptomatic bacteriuria  VS more c/w SIRS/Afib/CHF rather than sepsis  Reports no symptoms, will not treat  Hypokalemia Replace as needed Monitor BMP  Hypothyroidism TSH WNL, not likely to be contributing to cardiac issues as above  Continue Synthroid   Anxiety/depression Continue Prozac and trazodone at bedtime   IIDM A1c 6.1 Continue diet control   Deconditioning PT evaluation    overweight based on BMI: Body mass index is 25.55 kg/m.  Underweight - under 18  overweight - 25 to 29 obese - 30 or more Class 1 obesity: BMI of 30.0 to 34 Class 2 obesity: BMI of 35.0 to 39 Class 3 obesity: BMI of 40.0 to 49 Super Morbid Obesity: BMI 50-59 Super-super Morbid Obesity: BMI 60+ Significantly low or high BMI is associated with higher medical risk.  Weight management advised as adjunct to other disease management and risk reduction treatments    DVT prophylaxis: pt declines Eliquis / other anticoagulation d/t hx severe recurrent epistaxis IV fluids: no continuous IV fluids  Nutrition: cardiac diet Central lines / other devices: none  Code Status: DNR ACP documentation reviewed:  none on file in [facility]  TOC needs: TBD Medical barriers to dispo: IV rate control and diuresis. Expected medical readiness for discharge no sooner  than 04/19.              Subjective / Brief ROS:  Patient reports breathing and edema are better Denies CP/SOB at rest No palpations  Pain controlled.  Denies new weakness.  Tolerating diet.  Reports no  concerns w/ urination/defecation.   Family Communication: called daughter and all questions answered 12/08/23 4:11 PM     Objective Findings:  Vitals:   12/08/23 0930 12/08/23 1004 12/08/23 1030 12/08/23 1153  BP: 95/69  (!) 120/95 117/73  Pulse: (!) 58  95 93  Resp: (!) 22  (!) 22 18  Temp:  98.7 F (37.1 C)  98.3 F (36.8 C)  TempSrc:  Oral  Oral  SpO2: 95%  97% 93%  Weight:      Height:        Intake/Output Summary (Last 24 hours) at 12/08/2023 1611 Last data filed at 12/07/2023 1936 Gross per 24 hour  Intake --  Output 1000 ml  Net -1000 ml   Filed Weights   12/07/23 1255  Weight: 71.8 kg    Examination:  Physical Exam Constitutional:      General: She is not in acute distress. Cardiovascular:     Rate and Rhythm: Tachycardia present. Rhythm irregular.  Pulmonary:     Effort: Pulmonary effort is normal.     Breath sounds: Rales present.  Musculoskeletal:     Right lower leg: Edema present.     Left lower leg: Edema present.     Comments: Pt reports LE edema has improved compared to yesterday   Neurological:     General: No focal deficit present.     Mental Status: She is alert and oriented to person, place, and time.  Psychiatric:        Mood and Affect: Mood normal.        Behavior: Behavior normal.          Scheduled Medications:   aspirin EC  81 mg Oral Daily   busPIRone  10 mg Oral BID   carvedilol  12.5 mg Oral BID WC   enoxaparin (LOVENOX) injection  40 mg Subcutaneous Q24H   Fe Fum-Vit C-Vit B12-FA  1 capsule Oral Daily   furosemide  40 mg Intravenous BID   hydrALAZINE  50 mg Oral TID   irbesartan  300 mg Oral Daily   ketotifen  1 drop Both Eyes BID   levothyroxine  75 mcg Oral Daily   mirabegron ER  25 mg Oral Daily   pantoprazole  20 mg Oral Daily   PARoxetine  40 mg Oral QPM   rosuvastatin  5 mg Oral Daily   sodium chloride flush  3 mL Intravenous Q12H    Continuous Infusions:    PRN Medications:  acetaminophen,  ondansetron (ZOFRAN) IV, sodium chloride flush, traZODone  Antimicrobials from admission:  Anti-infectives (From admission, onward)    Start     Dose/Rate Route Frequency Ordered Stop   12/07/23 1300  levofloxacin (LEVAQUIN) IVPB 750 mg        750 mg 100 mL/hr over 90 Minutes Intravenous  Once 12/07/23 1254 12/07/23 1459           Data Reviewed:  I have personally reviewed the following...  CBC: Recent Labs  Lab 12/07/23 1242  WBC 6.1  NEUTROABS 4.3  HGB 13.5  HCT 42.9  MCV 100.0  PLT 189   Basic Metabolic Panel: Recent Labs  Lab 12/07/23 1242 12/07/23 1502 12/08/23 0522  NA 136  --  138  K 4.0  --  3.3*  CL 103  --  96*  CO2 23  --  36*  GLUCOSE 170*  --  102*  BUN 12  --  10  CREATININE 0.75  --  0.72  CALCIUM 8.8*  --  8.5*  MG  --  1.9  --   PHOS  --  2.5  --    GFR: Estimated Creatinine Clearance: 51.2 mL/min (by C-G formula based on SCr of 0.72 mg/dL). Liver Function Tests: Recent Labs  Lab 12/07/23 1242  AST 30  ALT 11  ALKPHOS 58  BILITOT 0.6  PROT 6.8  ALBUMIN 3.5   No results for input(s): "LIPASE", "AMYLASE" in the last 168 hours. No results for input(s): "AMMONIA" in the last 168 hours. Coagulation Profile: Recent Labs  Lab 12/07/23 1309  INR 1.1   Cardiac Enzymes: No results for input(s): "CKTOTAL", "CKMB", "CKMBINDEX", "TROPONINI" in the last 168 hours. BNP (last 3 results) No results for input(s): "PROBNP" in the last 8760 hours. HbA1C: No results for input(s): "HGBA1C" in the last 72 hours. CBG: Recent Labs  Lab 12/08/23 1208  GLUCAP 117*   Lipid Profile: No results for input(s): "CHOL", "HDL", "LDLCALC", "TRIG", "CHOLHDL", "LDLDIRECT" in the last 72 hours. Thyroid Function Tests: Recent Labs    12/07/23 1502  TSH 2.976   Anemia Panel: No results for input(s): "VITAMINB12", "FOLATE", "FERRITIN", "TIBC", "IRON", "RETICCTPCT" in the last 72 hours. Most Recent Urinalysis On File:     Component Value Date/Time    COLORURINE YELLOW (A) 12/07/2023 1309   APPEARANCEUR HAZY (A) 12/07/2023 1309   APPEARANCEUR Hazy (A) 05/09/2023 1300   LABSPEC 1.005 12/07/2023 1309   LABSPEC 1.005 12/29/2013 0119   PHURINE 6.0 12/07/2023 1309   GLUCOSEU NEGATIVE 12/07/2023 1309   GLUCOSEU Negative 12/29/2013 0119   HGBUR SMALL (A) 12/07/2023 1309   BILIRUBINUR NEGATIVE 12/07/2023 1309   BILIRUBINUR Negative 05/09/2023 1300   BILIRUBINUR Negative 12/29/2013 0119   KETONESUR NEGATIVE 12/07/2023 1309   PROTEINUR NEGATIVE 12/07/2023 1309   NITRITE NEGATIVE 12/07/2023 1309   LEUKOCYTESUR LARGE (A) 12/07/2023 1309   LEUKOCYTESUR 1+ 12/29/2013 0119   Sepsis Labs: @LABRCNTIP (procalcitonin:4,lacticidven:4) Microbiology: Recent Results (from the past 240 hours)  Resp panel by RT-PCR (RSV, Flu A&B, Covid) Anterior Nasal Swab     Status: None   Collection Time: 12/07/23  1:09 PM   Specimen: Anterior Nasal Swab  Result Value Ref Range Status   SARS Coronavirus 2 by RT PCR NEGATIVE NEGATIVE Final    Comment: (NOTE) SARS-CoV-2 target nucleic acids are NOT DETECTED.  The SARS-CoV-2 RNA is generally detectable in upper respiratory specimens during the acute phase of infection. The lowest concentration of SARS-CoV-2 viral copies this assay can detect is 138 copies/mL. A negative result does not preclude SARS-Cov-2 infection and should not be used as the sole basis for treatment or other patient management decisions. A negative result may occur with  improper specimen collection/handling, submission of specimen other than nasopharyngeal swab, presence of viral mutation(s) within the areas targeted by this assay, and inadequate number of viral copies(<138 copies/mL). A negative result must be combined with clinical observations, patient history, and epidemiological information. The expected result is Negative.  Fact Sheet for Patients:  BloggerCourse.com  Fact Sheet for Healthcare Providers:   SeriousBroker.it  This test is no t yet approved or cleared by the [place] FDA and  has been authorized for detection and/or diagnosis of SARS-CoV-2 by FDA under an Emergency Use  Authorization (EUA). This EUA will remain  in effect (meaning this test can be used) for the duration of the COVID-19 declaration under Section 564(b)(1) of the Act, 21 U.S.C.section 360bbb-3(b)(1), unless the authorization is terminated  or revoked sooner.       Influenza A by PCR NEGATIVE NEGATIVE Final   Influenza B by PCR NEGATIVE NEGATIVE Final    Comment: (NOTE) The Xpert Xpress SARS-CoV-2/FLU/RSV plus assay is intended as an aid in the diagnosis of influenza from Nasopharyngeal swab specimens and should not be used as a sole basis for treatment. Nasal washings and aspirates are unacceptable for Xpert Xpress SARS-CoV-2/FLU/RSV testing.  Fact Sheet for Patients: BloggerCourse.com  Fact Sheet for Healthcare Providers: SeriousBroker.it  This test is not yet approved or cleared by the [place]  FDA and has been authorized for detection and/or diagnosis of SARS-CoV-2 by FDA under an Emergency Use Authorization (EUA). This EUA will remain in effect (meaning this test can be used) for the duration of the COVID-19 declaration under Section 564(b)(1) of the Act, 21 U.S.C. section 360bbb-3(b)(1), unless the authorization is terminated or revoked.     Resp Syncytial Virus by PCR NEGATIVE NEGATIVE Final    Comment: (NOTE) Fact Sheet for Patients: BloggerCourse.com  Fact Sheet for Healthcare Providers: SeriousBroker.it  This test is not yet approved or cleared by the [place]  FDA and has been authorized for detection and/or diagnosis of SARS-CoV-2 by FDA under an Emergency Use Authorization (EUA). This EUA will remain in effect (meaning this test can be used) for  the duration of the COVID-19 declaration under Section 564(b)(1) of the Act, 21 U.S.C. section 360bbb-3(b)(1), unless the authorization is terminated or revoked.  Performed at [facility], [address], [place], [place] [address]   Blood Culture (routine x 2)     Status: None (Preliminary result)   Collection Time: 12/07/23  1:09 PM   Specimen: BLOOD  Result Value Ref Range Status   Specimen Description   Final    BLOOD Blood Culture results may not be optimal due to an inadequate volume of blood received in culture bottles   Special Requests   Final    BOTTLES DRAWN AEROBIC AND ANAEROBIC RIGHT ANTECUBITAL   Culture   Final    NO GROWTH < 24 HOURS Performed at [facility], [address], [place], [place] [address]    Report Status PENDING  Incomplete  Blood Culture (routine x 2)     Status: None (Preliminary result)   Collection Time: 12/07/23  1:09 PM   Specimen: BLOOD  Result Value Ref Range Status   Specimen Description   Final    BLOOD Blood Culture results may not be optimal due to an inadequate volume of blood received in culture bottles   Special Requests   Final    BOTTLES DRAWN AEROBIC AND ANAEROBIC LEFT ANTECUBITAL   Culture   Final    NO GROWTH < 24 HOURS Performed at [facility], [address] [place], [place] [address]    Report Status PENDING  Incomplete  Urine Culture     Status: None (Preliminary result)   Collection Time: 12/07/23  1:09 PM   Specimen: Urine, Clean Catch  Result Value Ref Range Status   Specimen Description   Final    URINE, CLEAN CATCH Performed at [facility], [address], [place], [place] [address]    Special Requests   Final    NONE Reflexed from [id number] Performed at  [facility], [address], [place], [place] [address]    Culture PENDING  Incomplete   Report Status PENDING  Incomplete      Radiology Studies last 3 days: ECHOCARDIOGRAM COMPLETE Result Date: 12/08/2023     ECHOCARDIOGRAM REPORT   Patient Name:   CYNITHA BERTE Date of Exam: 12/08/2023 Medical Rec #:  [id number]     Height:       66.0 in Accession #:    [id number]    Weight:       158.3 lb Date of Birth:  09-10-1934     BSA:          1.811 m Patient Age:    88 years      BP:           141/74 mmHg Patient Gender: F             HR:           106 bpm. Exam Location:  [facility] Procedure: 2D Echo, Cardiac Doppler and Color Doppler (Both Spectral and Color            Flow Doppler were utilized during procedure). Indications:     CHF--acute diastolic I50.31  History:         Patient has prior history of Echocardiogram examinations, most                  recent 09/18/2021. CAD, Signs/Symptoms:Murmur; Risk                  Factors:Hypertension.  Sonographer:     Broadus Canes Referring Phys:  [id number] [place] Diagnosing Phys: Lanell Pinta Custovic IMPRESSIONS  1. Left ventricular ejection fraction, by estimation, is 60 to 65%. The left ventricle has normal function. The left ventricle has no regional wall motion abnormalities. Left ventricular diastolic parameters are indeterminate.  2. Right ventricular systolic function is normal. The right ventricular size is normal.  3. Left atrial size was mildly dilated.  4. Large pleural effusion.  5. The mitral valve is normal in structure. Trivial mitral valve regurgitation. No evidence of mitral stenosis.  6. The aortic valve is normal in structure. Aortic valve regurgitation is not visualized. No aortic stenosis is present.  7. The inferior vena cava is normal in size with greater than 50% respiratory variability, suggesting right atrial pressure of 3 mmHg. FINDINGS  Left Ventricle: Left ventricular ejection fraction, by estimation, is 60 to 65%. The left ventricle has normal function. The left ventricle has no regional wall motion abnormalities. The left ventricular internal cavity size was normal in size. There is  no left ventricular hypertrophy. Left ventricular diastolic parameters are  indeterminate. Right Ventricle: The right ventricular size is normal. No increase in right ventricular wall thickness. Right ventricular systolic function is normal. Left Atrium: Left atrial size was mildly dilated. Right Atrium: Right atrial size was normal in size. Pericardium: There is no evidence of pericardial effusion. Mitral Valve: The mitral valve is normal in structure. Trivial mitral valve regurgitation. No evidence of mitral valve stenosis. Tricuspid Valve: The tricuspid valve is normal in structure. Tricuspid valve regurgitation is mild. Aortic Valve: The aortic valve is normal in structure. Aortic valve regurgitation is not visualized. No aortic stenosis is present. Aortic valve mean gradient measures 4.0 mmHg. Aortic valve peak gradient measures 7.0 mmHg. Aortic valve area, by VTI measures 3.93 cm. Pulmonic Valve: The pulmonic valve was normal in structure. Pulmonic valve regurgitation is not visualized. Aorta: The aortic  root is normal in size and structure. Venous: The inferior vena cava is normal in size with greater than 50% respiratory variability, suggesting right atrial pressure of 3 mmHg. IAS/Shunts: No atrial level shunt detected by color flow Doppler. Additional Comments: There is a large pleural effusion.  LEFT VENTRICLE PLAX 2D LVIDd:         3.70 cm LVIDs:         2.30 cm LV PW:         0.80 cm LV IVS:        1.60 cm LVOT diam:     2.00 cm LV SV:         77 LV SV Index:   42 LVOT Area:     3.14 cm  RIGHT VENTRICLE RV Basal diam:  3.60 cm RV Mid diam:    3.20 cm LEFT ATRIUM             Index        RIGHT ATRIUM           Index LA diam:        4.40 cm 2.43 cm/m   RA Area:     14.40 cm LA Vol (A2C):   41.4 ml 22.87 ml/m  RA Volume:   31.70 ml  17.51 ml/m LA Vol (A4C):   39.4 ml 21.76 ml/m LA Biplane Vol: 44.1 ml 24.36 ml/m  AORTIC VALVE AV Area (Vmax):    3.62 cm AV Area (Vmean):   3.86 cm AV Area (VTI):     3.93 cm AV Vmax:           132.00 cm/s AV Vmean:          87.100 cm/s AV  VTI:            0.195 m AV Peak Grad:      7.0 mmHg AV Mean Grad:      4.0 mmHg LVOT Vmax:         152.00 cm/s LVOT Vmean:        107.000 cm/s LVOT VTI:          0.244 m LVOT/AV VTI ratio: 1.25  AORTA Ao Root diam: 3.40 cm MITRAL VALVE                TRICUSPID VALVE MV Area (PHT): 5.34 cm     TR Peak grad:   34.1 mmHg MV Decel Time: 142 msec     TR Vmax:        292.00 cm/s MV E velocity: 112.00 cm/s                             SHUNTS                             Systemic VTI:  0.24 m                             Systemic Diam: 2.00 cm Rozell Searing Custovic Electronically signed by Clotilde Dieter Signature Date/Time: 12/08/2023/12:14:23 PM    Final    DG Chest 1 View Result Date: 12/08/2023 CLINICAL DATA:  89 year old female with CHF. EXAM: CHEST  1 VIEW COMPARISON:  Portable chest yesterday and earlier. FINDINGS: Portable AP semi upright view at 0650 hours. Stable to mildly improved lung volumes. Stable cardiac size and mediastinal contours. Calcified aortic atherosclerosis. Layering  pleural effusions have not significantly changed, small to moderate on the left and small on the right. No superimposed pneumothorax, consolidation. No overt edema, and pulmonary vascularity appears regressed. Stable visualized osseous structures.  Paucity of bowel gas. IMPRESSION: Regressed/resolved pulmonary vascular congestion since yesterday with stable small to moderate pleural effusions. Electronically Signed   By: Marlise Simpers M.D.   On: 12/08/2023 07:03   DG Chest Port 1 View Result Date: 12/07/2023 CLINICAL DATA:  Questionable sepsis - evaluate for abnormality EXAM: PORTABLE CHEST 1 VIEW COMPARISON:  09/17/2021. FINDINGS: There are diffuse mild-to-moderately increased interstitial markings. There is left retrocardiac airspace opacity obscuring the left hemidiaphragm, descending thoracic aorta and blunting the left lateral costophrenic angle, suggesting combination of left lung atelectasis and/or consolidation with pleural effusion.  Bilateral lungs are otherwise clear. There is blunting of right lateral costophrenic angle suggesting small right pleural effusion. Stable cardio-mediastinal silhouette. No acute osseous abnormalities. The soft tissues are within normal limits. IMPRESSION: 1. Findings favor congestive heart failure/pulmonary edema. 2. There is left retrocardiac opacity, as described above. 3. Small bilateral pleural effusions. Electronically Signed   By: Beula Brunswick M.D.   On: 12/07/2023 13:35       Time spent: 35 min     Melodi Sprung, DO [facility] 12/08/2023, 4:11 PM    Dictation software may have been used to generate the above note. Typos may occur and escape review in typed/dictated notes. Please contact Dr Authur Leghorn directly for clarity if needed.  Staff may message me via secure chat in Epic  but this may not receive an immediate response,  please page me for urgent matters!  If 7PM-7AM, please contact night coverage [email or web address]

## 2023-12-08 NOTE — Progress Notes (Signed)
 PT Cancellation Note  Patient Details Name: Mary [name] MRN: [id number] DOB: Jan 08, 1935   Cancelled Treatment:    Reason Eval/Treat Not Completed: Patient not medically ready PT orders received, chart reviewed. Pt noted to have elevated HR & BP at rest, will hold PT evaluation at this time & f/u once pt more appropriate for exertional activity.  Emaline Handsome, PT, DPT 12/08/23, 8:50 AM   Venetta Gill 12/08/2023, 8:49 AM

## 2023-12-09 ENCOUNTER — Telehealth (HOSPITAL_COMMUNITY): Payer: Self-pay | Admitting: Pharmacy Technician

## 2023-12-09 ENCOUNTER — Inpatient Hospital Stay

## 2023-12-09 ENCOUNTER — Encounter: Payer: Self-pay | Admitting: Oncology

## 2023-12-09 ENCOUNTER — Other Ambulatory Visit (HOSPITAL_COMMUNITY): Payer: Self-pay

## 2023-12-09 DIAGNOSIS — I5033 Acute on chronic diastolic (congestive) heart failure: Secondary | ICD-10-CM | POA: Diagnosis not present

## 2023-12-09 LAB — LACTATE DEHYDROGENASE, PLEURAL OR PERITONEAL FLUID: LD, Fluid: 78 U/L — ABNORMAL HIGH (ref 3–23)

## 2023-12-09 LAB — BASIC METABOLIC PANEL WITH GFR
Anion gap: 9 (ref 5–15)
BUN: 14 mg/dL (ref 8–23)
CO2: 35 mmol/L — ABNORMAL HIGH (ref 22–32)
Calcium: 8.8 mg/dL — ABNORMAL LOW (ref 8.9–10.3)
Chloride: 94 mmol/L — ABNORMAL LOW (ref 98–111)
Creatinine, Ser: 0.8 mg/dL (ref 0.44–1.00)
GFR, Estimated: >60 mL/min (ref >60)
Glucose, Bld: 118 mg/dL — ABNORMAL HIGH (ref 70–99)
Potassium: 2.8 mmol/L — ABNORMAL LOW (ref 3.5–5.1)
Sodium: 138 mmol/L (ref 135–145)

## 2023-12-09 LAB — BODY FLUID CELL COUNT WITH DIFFERENTIAL
Eos, Fluid: 0 %
Lymphs, Fluid: 73 %
Monocyte-Macrophage-Serous Fluid: 16 %
Neutrophil Count, Fluid: 11 %
Total Nucleated Cell Count, Fluid: 242 uL

## 2023-12-09 LAB — GLUCOSE, PLEURAL OR PERITONEAL FLUID: Glucose, Fluid: 141 mg/dL

## 2023-12-09 LAB — PROTEIN, PLEURAL OR PERITONEAL FLUID: Total protein, fluid: <3 g/dL

## 2023-12-09 MED ORDER — LIDOCAINE HCL (PF) 1 % IJ SOLN
10.0000 mL | Freq: Once | INTRAMUSCULAR | Status: AC
  Administered 2023-12-09: 10 mL via SUBCUTANEOUS
  Filled 2023-12-09: qty 10

## 2023-12-09 MED ORDER — CYCLOBENZAPRINE HCL 10 MG PO TABS
5.0000 mg | ORAL_TABLET | Freq: Three times a day (TID) | ORAL | Status: DC | PRN
  Administered 2023-12-09 – 2023-12-15 (×7): 5 mg via ORAL
  Filled 2023-12-09 (×7): qty 1

## 2023-12-09 MED ORDER — APIXABAN 5 MG PO TABS
5.0000 mg | ORAL_TABLET | Freq: Two times a day (BID) | ORAL | Status: DC
  Administered 2023-12-09 – 2023-12-16 (×15): 5 mg via ORAL
  Filled 2023-12-09 (×15): qty 1

## 2023-12-09 MED ORDER — GABAPENTIN 100 MG PO CAPS
200.0000 mg | ORAL_CAPSULE | Freq: Every day | ORAL | Status: DC
  Administered 2023-12-09 – 2023-12-15 (×7): 200 mg via ORAL
  Filled 2023-12-09 (×7): qty 2

## 2023-12-09 MED ORDER — POTASSIUM CHLORIDE CRYS ER 20 MEQ PO TBCR: 40.0000 meq | EXTENDED_RELEASE_TABLET | Freq: Once | ORAL | Status: DC

## 2023-12-09 MED ORDER — POTASSIUM CHLORIDE CRYS ER 20 MEQ PO TBCR
40.0000 meq | EXTENDED_RELEASE_TABLET | Freq: Two times a day (BID) | ORAL | Status: AC
  Administered 2023-12-09 – 2023-12-10 (×2): 40 meq via ORAL
  Filled 2023-12-09 (×2): qty 2

## 2023-12-09 NOTE — Progress Notes (Signed)
 PROGRESS NOTE    Mary [name]   FMW:[id number] DOB: 16-Mar-1935  DOA: 12/07/2023 Date of Service: 12/09/23 which is hospital day 2  PCP: Mary Amis, MD    Hospital course / significant events:   HPI:  Mary [name] is a 89 y.o. female with medical history significant of HTN, chronic HFpEF, CAD, hypothyroidism, recurrent nosebleed off dual antiplatelet therapy, question of PAF, presented from home with worsening of cough shortness of breath and peripheral edema. Onset 7 days prior. Came to ED when noting SOB at rest.    04/16: to ED. CXR pulmonary congestion and bilateral pleural effusion, elevated HR and Afib RVR on EKG. Admitted to hospitalist service for acute on chronic HFpEF and Afib RVR. IV lasix , digoxin  loading IV also ordered 04/17: HR remains labile, occasionally sustaining 130s-140s. Increase beta blocker. Echo results pending. Cardiology consulted re: continuation digoxin  vs other, HR responding to beta blocker so will not continue digoxin  04/18: pending thoracentesis. HR still up a bit, monitoring      Consultants:  Cardiology   Procedures/Surgeries: none      ASSESSMENT & PLAN:   Acute hypoxic respiratory failure Due to pulmonary edema and pleural effusion from CHF O2 supplement, wean as able Treat underlying cause(s)  Pleural effusion - noted large on Echo, moderate on CXR Decreased lung sounds L lung base likely effusion US  +/- thoracentesis if substantial amount of fluid is present on US    Acute on chronic HFpEF decompensation Question secondary to A-fib with RVR or may be primary/initial problem Echocardiogram --> EF 60-65%, no RWMA, indeterminate diastolic parameters but suspect some diastolic dysfunction given clinical CHF  Treat Afib as noted IV Lasix  40 mg twice daily - adjust per cardiology  Coreg  12.5 mg twice daily  defer additional digoxin . Irbesartan  300 mg daily Hydralazine  50 mg 3 times daily,  Aspirin  81 mg daily Crestor  5 mg  daily. Strict I&O's   PAF with RVR RVR resolved  Question secondary to CHF or may be primary/initial problem Coreg  12.5 mg twice daily  defer additional digoxin . Follow potassium, magnesium  and phosphorus levels and correct electrolytes as needed CHADS2VASC = 7, indication for systemic anticoagulation.  However patient reported that she has had repeated severe nosebleed recently and antiplatelet therapy of dual antiplatelet aspirin  Plavix  have been held for 2 months.  Will start aspirin , revisit anticoagulation per cardiology recs   SIRS assoc w/ acute resp fail, Afib, CHF Treat as above No leukocytosis Unlikely sepsis but does have abn UA, see below monitor for urinary symptoms   Essential Hypertension  Abnormal UA  Asymptomatic bacteriuria  VS more c/w SIRS/Afib/CHF rather than sepsis  Reports no symptoms, will not treat  Hypokalemia Replace as needed Monitor BMP  Hypothyroidism TSH WNL, not likely to be contributing to cardiac issues as above  Continue Synthroid    Anxiety/depression Continue Prozac and trazodone  at bedtime   IIDM A1c 6.1 Continue diet control   Deconditioning PT evaluation    overweight based on BMI: Body mass index is 25.55 kg/m.  Underweight - under 18  overweight - 25 to 29 obese - 30 or more Class 1 obesity: BMI of 30.0 to 34 Class 2 obesity: BMI of 35.0 to 39 Class 3 obesity: BMI of 40.0 to 49 Super Morbid Obesity: BMI 50-59 Super-super Morbid Obesity: BMI 60+ Significantly low or high BMI is associated with higher medical risk.  Weight management advised as adjunct to other disease management and risk reduction treatments    DVT prophylaxis: pt  declines Eliquis  / other anticoagulation d/t hx severe recurrent epistaxis IV fluids: no continuous IV fluids  Nutrition: cardiac diet Central lines / other devices: none  Code Status: DNR ACP documentation reviewed:  none on file in [facility]  TOC needs: TBD, pend PT/OT eval but pt states  she would not want to go to rehab/SNF under any circumstance  Medical barriers to dispo: IV rate control and diuresis. Expected medical readiness for discharge potentially 04/19             Subjective / Brief ROS:  Patient reports breathing and edema are better Denies CP/SOB at rest No palpations  Pain controlled.  Denies new weakness.  Tolerating diet.  Reports no concerns w/ urination/defecation.   Family Communication: called daughter and all questions answered 12/09/23 2:10 PM     Objective Findings:  Vitals:   12/09/23 0500 12/09/23 0532 12/09/23 0821 12/09/23 1152  BP:   (!) 151/91 (!) 120/52  Pulse:   68 97  Resp:   16 16  Temp:   98.7 F (37.1 C) 98.2 F (36.8 C)  TempSrc:      SpO2:   91% 96%  Weight: 69 kg 68.5 kg    Height:        Intake/Output Summary (Last 24 hours) at 12/09/2023 1410 Last data filed at 12/08/2023 2132 Gross per 24 hour  Intake 243 ml  Output 500 ml  Net -257 ml   Filed Weights   12/07/23 1255 12/09/23 0500 12/09/23 0532  Weight: 71.8 kg 69 kg 68.5 kg    Examination:  Physical Exam Constitutional:      General: She is not in acute distress. Cardiovascular:     Rate and Rhythm: Tachycardia present. Rhythm irregular.  Pulmonary:     Effort: Pulmonary effort is normal.     Breath sounds: Examination of the left-lower field reveals decreased breath sounds. Decreased breath sounds and rales (improved) present.  Musculoskeletal:     Right lower leg: Edema (improved) present.     Left lower leg: Edema (improved) present.     Comments: Pt reports LE edema has improved compared to yesterday   Neurological:     General: No focal deficit present.     Mental Status: She is alert and oriented to person, place, and time.  Psychiatric:        Mood and Affect: Mood normal.        Behavior: Behavior normal.          Scheduled Medications:   apixaban   5 mg Oral BID   busPIRone   10 mg Oral BID   carvedilol   12.5 mg Oral BID  WC   Fe Fum-Vit C-Vit B12-FA  1 capsule Oral Daily   furosemide   40 mg Intravenous BID   gabapentin   200 mg Oral QHS   hydrALAZINE   50 mg Oral TID   irbesartan   300 mg Oral Daily   ketotifen   1 drop Both Eyes BID   levothyroxine   75 mcg Oral Daily   mirabegron  ER  25 mg Oral Daily   pantoprazole   20 mg Oral Daily   PARoxetine   40 mg Oral QPM   potassium chloride   40 mEq Oral BID WC   rosuvastatin   5 mg Oral Daily   sodium chloride  flush  3 mL Intravenous Q12H    Continuous Infusions:    PRN Medications:  acetaminophen , cyclobenzaprine , ondansetron  (ZOFRAN ) IV, sodium chloride  flush, traZODone   Antimicrobials from admission:  Anti-infectives (From admission, onward)  Start     Dose/Rate Route Frequency Ordered Stop   12/07/23 1300  levofloxacin  (LEVAQUIN ) IVPB 750 mg        750 mg 100 mL/hr over 90 Minutes Intravenous  Once 12/07/23 1254 12/07/23 1459           Data Reviewed:  I have personally reviewed the following...  CBC: Recent Labs  Lab 12/07/23 1242  WBC 6.1  NEUTROABS 4.3  HGB 13.5  HCT 42.9  MCV 100.0  PLT 189   Basic Metabolic Panel: Recent Labs  Lab 12/07/23 1242 12/07/23 1502 12/08/23 0522 12/09/23 0510  NA 136  --  138 138  K 4.0  --  3.3* 2.8*  CL 103  --  96* 94*  CO2 23  --  36* 35*  GLUCOSE 170*  --  102* 118*  BUN 12  --  10 14  CREATININE 0.75  --  0.72 0.80  CALCIUM  8.8*  --  8.5* 8.8*  MG  --  1.9  --   --   PHOS  --  2.5  --   --    GFR: Estimated Creatinine Clearance: 47.3 mL/min (by C-G formula based on SCr of 0.8 mg/dL). Liver Function Tests: Recent Labs  Lab 12/07/23 1242  AST 30  ALT 11  ALKPHOS 58  BILITOT 0.6  PROT 6.8  ALBUMIN 3.5   No results for input(s): LIPASE, AMYLASE in the last 168 hours. No results for input(s): AMMONIA in the last 168 hours. Coagulation Profile: Recent Labs  Lab 12/07/23 1309  INR 1.1   Cardiac Enzymes: No results for input(s): CKTOTAL, CKMB, CKMBINDEX,  TROPONINI in the last 168 hours. BNP (last 3 results) No results for input(s): PROBNP in the last 8760 hours. HbA1C: No results for input(s): HGBA1C in the last 72 hours. CBG: Recent Labs  Lab 12/08/23 1208  GLUCAP 117*   Lipid Profile: No results for input(s): CHOL, HDL, LDLCALC, TRIG, CHOLHDL, LDLDIRECT in the last 72 hours. Thyroid Function Tests: Recent Labs    12/07/23 1502  TSH 2.976   Anemia Panel: No results for input(s): VITAMINB12, FOLATE, FERRITIN, TIBC, IRON , RETICCTPCT in the last 72 hours. Most Recent Urinalysis On File:     Component Value Date/Time   COLORURINE YELLOW (A) 12/07/2023 1309   APPEARANCEUR HAZY (A) 12/07/2023 1309   APPEARANCEUR Hazy (A) 05/09/2023 1300   LABSPEC 1.005 12/07/2023 1309   LABSPEC 1.005 12/29/2013 0119   PHURINE 6.0 12/07/2023 1309   GLUCOSEU NEGATIVE 12/07/2023 1309   GLUCOSEU Negative 12/29/2013 0119   HGBUR SMALL (A) 12/07/2023 1309   BILIRUBINUR NEGATIVE 12/07/2023 1309   BILIRUBINUR Negative 05/09/2023 1300   BILIRUBINUR Negative 12/29/2013 0119   KETONESUR NEGATIVE 12/07/2023 1309   PROTEINUR NEGATIVE 12/07/2023 1309   NITRITE NEGATIVE 12/07/2023 1309   LEUKOCYTESUR LARGE (A) 12/07/2023 1309   LEUKOCYTESUR 1+ 12/29/2013 0119   Sepsis Labs: @LABRCNTIP (procalcitonin:4,lacticidven:4) Microbiology: Recent Results (from the past 240 hours)  Resp panel by RT-PCR (RSV, Flu A&B, Covid) Anterior Nasal Swab     Status: None   Collection Time: 12/07/23  1:09 PM   Specimen: Anterior Nasal Swab  Result Value Ref Range Status   SARS Coronavirus 2 by RT PCR NEGATIVE NEGATIVE Final    Comment: (NOTE) SARS-CoV-2 target nucleic acids are NOT DETECTED.  The SARS-CoV-2 RNA is generally detectable in upper respiratory specimens during the acute phase of infection. The lowest concentration of SARS-CoV-2 viral copies this assay can detect is 138 copies/mL. A negative result  does not preclude  SARS-Cov-2 infection and should not be used as the sole basis for treatment or other patient management decisions. A negative result may occur with  improper specimen collection/handling, submission of specimen other than nasopharyngeal swab, presence of viral mutation(s) within the areas targeted by this assay, and inadequate number of viral copies(<138 copies/mL). A negative result must be combined with clinical observations, patient history, and epidemiological information. The expected result is Negative.  Fact Sheet for Patients:  bloggercourse.com  Fact Sheet for Healthcare Providers:  seriousbroker.it  This test is no t yet approved or cleared by the [place]  FDA and  has been authorized for detection and/or diagnosis of SARS-CoV-2 by FDA under an Emergency Use Authorization (EUA). This EUA will remain  in effect (meaning this test can be used) for the duration of the COVID-19 declaration under Section 564(b)(1) of the Act, 21 U.S.C.section 360bbb-3(b)(1), unless the authorization is terminated  or revoked sooner.       Influenza A by PCR NEGATIVE NEGATIVE Final   Influenza B by PCR NEGATIVE NEGATIVE Final    Comment: (NOTE) The Xpert Xpress SARS-CoV-2/FLU/RSV plus assay is intended as an aid in the diagnosis of influenza from Nasopharyngeal swab specimens and should not be used as a sole basis for treatment. Nasal washings and aspirates are unacceptable for Xpert Xpress SARS-CoV-2/FLU/RSV testing.  Fact Sheet for Patients: bloggercourse.com  Fact Sheet for Healthcare Providers: seriousbroker.it  This test is not yet approved or cleared by the [place]  FDA and has been authorized for detection and/or diagnosis of SARS-CoV-2 by FDA under an Emergency Use Authorization (EUA). This EUA will remain in effect (meaning this test can be used) for the duration of  the COVID-19 declaration under Section 564(b)(1) of the Act, 21 U.S.C. section 360bbb-3(b)(1), unless the authorization is terminated or revoked.     Resp Syncytial Virus by PCR NEGATIVE NEGATIVE Final    Comment: (NOTE) Fact Sheet for Patients: bloggercourse.com  Fact Sheet for Healthcare Providers: seriousbroker.it  This test is not yet approved or cleared by the [place]  FDA and has been authorized for detection and/or diagnosis of SARS-CoV-2 by FDA under an Emergency Use Authorization (EUA). This EUA will remain in effect (meaning this test can be used) for the duration of the COVID-19 declaration under Section 564(b)(1) of the Act, 21 U.S.C. section 360bbb-3(b)(1), unless the authorization is terminated or revoked.  Performed at [facility], [address] [place], [place] [address]   Blood Culture (routine x 2)     Status: None (Preliminary result)   Collection Time: 12/07/23  1:09 PM   Specimen: BLOOD  Result Value Ref Range Status   Specimen Description   Final    BLOOD Blood Culture results may not be optimal due to an inadequate volume of blood received in culture bottles   Special Requests   Final    BOTTLES DRAWN AEROBIC AND ANAEROBIC RIGHT ANTECUBITAL   Culture   Final    NO GROWTH 2 DAYS Performed at [facility], [address], [place], [place] [address]    Report Status PENDING  Incomplete  Blood Culture (routine x 2)     Status: None (Preliminary result)   Collection Time: 12/07/23  1:09 PM   Specimen: BLOOD  Result Value Ref Range Status   Specimen Description   Final    BLOOD Blood Culture results may not be optimal due to an inadequate volume of blood received in culture bottles   Special  Requests   Final    BOTTLES DRAWN AEROBIC AND ANAEROBIC LEFT ANTECUBITAL   Culture   Final    NO GROWTH 2 DAYS Performed at [facility], [address] [place], [place]  [address]    Report Status PENDING  Incomplete  Urine Culture     Status: None   Collection Time: 12/07/23  1:09 PM   Specimen: Urine, Clean Catch  Result Value Ref Range Status   Specimen Description   Final    URINE, CLEAN CATCH Performed at [facility], [address] [place], [place] [address]    Special Requests   Final    NONE Reflexed from [id number] Performed at [facility], [address] [place], [place] [address]    Culture   Final    NO GROWTH Performed at [facility], [address] [place], [place] [address]    Report Status 12/08/2023 FINAL  Final      Radiology Studies last 3 days: ECHOCARDIOGRAM COMPLETE Result Date: 12/08/2023    ECHOCARDIOGRAM REPORT   Patient Name:   Mary [name] Date of Exam: 12/08/2023 Medical Rec #:  [id number]     Height:       66.0 in Accession #:    [id number]    Weight:       158.3 lb Date of Birth:  Jun 16, 1935     BSA:          1.811 m Patient Age:    88 years      BP:           141/74 mmHg Patient Gender: F             HR:           106 bpm. Exam Location:  [facility] Procedure: 2D Echo, Cardiac Doppler and Color Doppler (Both Spectral and Color            Flow Doppler were utilized during procedure). Indications:     CHF--acute diastolic I50.31  History:         Patient has prior history of Echocardiogram examinations, most                  recent 09/18/2021. CAD, Signs/Symptoms:Murmur; Risk                  Factors:Hypertension.  Sonographer:     Christopher Furnace Referring Phys:  [id number] CORT ONEIDA MANA Diagnosing Phys: Annalee Custovic IMPRESSIONS  1. Left ventricular ejection fraction, by estimation, is 60 to 65%. The left ventricle has normal function. The left ventricle has no regional wall motion abnormalities. Left ventricular diastolic parameters are indeterminate.  2. Right ventricular systolic function is normal. The right ventricular size is normal.  3. Left atrial size was mildly dilated.  4. Large pleural effusion.  5. The mitral  valve is normal in structure. Trivial mitral valve regurgitation. No evidence of mitral stenosis.  6. The aortic valve is normal in structure. Aortic valve regurgitation is not visualized. No aortic stenosis is present.  7. The inferior vena cava is normal in size with greater than 50% respiratory variability, suggesting right atrial pressure of 3 mmHg. FINDINGS  Left Ventricle: Left ventricular ejection fraction, by estimation, is 60 to 65%. The left ventricle has normal function. The left ventricle has no regional wall motion abnormalities. The left ventricular internal cavity size was normal in size. There is  no left ventricular hypertrophy. Left ventricular diastolic parameters are indeterminate.  Right Ventricle: The right ventricular size is normal. No increase in right ventricular wall thickness. Right ventricular systolic function is normal. Left Atrium: Left atrial size was mildly dilated. Right Atrium: Right atrial size was normal in size. Pericardium: There is no evidence of pericardial effusion. Mitral Valve: The mitral valve is normal in structure. Trivial mitral valve regurgitation. No evidence of mitral valve stenosis. Tricuspid Valve: The tricuspid valve is normal in structure. Tricuspid valve regurgitation is mild. Aortic Valve: The aortic valve is normal in structure. Aortic valve regurgitation is not visualized. No aortic stenosis is present. Aortic valve mean gradient measures 4.0 mmHg. Aortic valve peak gradient measures 7.0 mmHg. Aortic valve area, by VTI measures 3.93 cm. Pulmonic Valve: The pulmonic valve was normal in structure. Pulmonic valve regurgitation is not visualized. Aorta: The aortic root is normal in size and structure. Venous: The inferior vena cava is normal in size with greater than 50% respiratory variability, suggesting right atrial pressure of 3 mmHg. IAS/Shunts: No atrial level shunt detected by color flow Doppler. Additional Comments: There is a large pleural effusion.   LEFT VENTRICLE PLAX 2D LVIDd:         3.70 cm LVIDs:         2.30 cm LV PW:         0.80 cm LV IVS:        1.60 cm LVOT diam:     2.00 cm LV SV:         77 LV SV Index:   42 LVOT Area:     3.14 cm  RIGHT VENTRICLE RV Basal diam:  3.60 cm RV Mid diam:    3.20 cm LEFT ATRIUM             Index        RIGHT ATRIUM           Index LA diam:        4.40 cm 2.43 cm/m   RA Area:     14.40 cm LA Vol (A2C):   41.4 ml 22.87 ml/m  RA Volume:   31.70 ml  17.51 ml/m LA Vol (A4C):   39.4 ml 21.76 ml/m LA Biplane Vol: 44.1 ml 24.36 ml/m  AORTIC VALVE AV Area (Vmax):    3.62 cm AV Area (Vmean):   3.86 cm AV Area (VTI):     3.93 cm AV Vmax:           132.00 cm/s AV Vmean:          87.100 cm/s AV VTI:            0.195 m AV Peak Grad:      7.0 mmHg AV Mean Grad:      4.0 mmHg LVOT Vmax:         152.00 cm/s LVOT Vmean:        107.000 cm/s LVOT VTI:          0.244 m LVOT/AV VTI ratio: 1.25  AORTA Ao Root diam: 3.40 cm MITRAL VALVE                TRICUSPID VALVE MV Area (PHT): 5.34 cm     TR Peak grad:   34.1 mmHg MV Decel Time: 142 msec     TR Vmax:        292.00 cm/s MV E velocity: 112.00 cm/s  SHUNTS                             Systemic VTI:  0.24 m                             Systemic Diam: 2.00 cm Annalee Casa Electronically signed by Annalee Casa Signature Date/Time: 12/08/2023/12:14:23 PM    Final    DG Chest 1 View Result Date: 12/08/2023 CLINICAL DATA:  89 year old female with CHF. EXAM: CHEST  1 VIEW COMPARISON:  Portable chest yesterday and earlier. FINDINGS: Portable AP semi upright view at 0650 hours. Stable to mildly improved lung volumes. Stable cardiac size and mediastinal contours. Calcified aortic atherosclerosis. Layering pleural effusions have not significantly changed, small to moderate on the left and small on the right. No superimposed pneumothorax, consolidation. No overt edema, and pulmonary vascularity appears regressed. Stable visualized osseous structures.  Paucity  of bowel gas. IMPRESSION: Regressed/resolved pulmonary vascular congestion since yesterday with stable small to moderate pleural effusions. Electronically Signed   By: VEAR Hurst M.D.   On: 12/08/2023 07:03   DG Chest Port 1 View Result Date: 12/07/2023 CLINICAL DATA:  Questionable sepsis - evaluate for abnormality EXAM: PORTABLE CHEST 1 VIEW COMPARISON:  09/17/2021. FINDINGS: There are diffuse mild-to-moderately increased interstitial markings. There is left retrocardiac airspace opacity obscuring the left hemidiaphragm, descending thoracic aorta and blunting the left lateral costophrenic angle, suggesting combination of left lung atelectasis and/or consolidation with pleural effusion. Bilateral lungs are otherwise clear. There is blunting of right lateral costophrenic angle suggesting small right pleural effusion. Stable cardio-mediastinal silhouette. No acute osseous abnormalities. The soft tissues are within normal limits. IMPRESSION: 1. Findings favor congestive heart failure/pulmonary edema. 2. There is left retrocardiac opacity, as described above. 3. Small bilateral pleural effusions. Electronically Signed   By: Ree Molt M.D.   On: 12/07/2023 13:35       Time spent: 35 min     Khrystian Schauf, DO [facility] 12/09/2023, 2:10 PM    Dictation software may have been used to generate the above note. Typos may occur and escape review in typed/dictated notes. Please contact Dr Marsa directly for clarity if needed.  Staff may message me via secure chat in Epic  but this may not receive an immediate response,  please page me for urgent matters!  If 7PM-7AM, please contact night coverage [email or web address]

## 2023-12-09 NOTE — Progress Notes (Addendum)
 Physical Therapy Treatment Patient Details Name: Mary [name] MRN: [id number] DOB: [birth year] Today's Date: 12/09/2023   History of Present Illness Pt is an 89 y/o F admitted on 12/07/23 after presenting with worsening cough, SOB & peripheral edema. Pt found to have B pleural effusion, elevated HR, & a-fib with RVR. Pt is being treated for acute on chronic HFpEF & a-fib with RVR. PMH: HTN, chronic HFpEF, CAD, hypothyroidism, recurrent nosebleed, question of PAF, asthma, c diff infection, lumbar DDD, depression, DM2, diabetic retinopathy, heart murmur, HLD, RA, sleep apnea, spinal stenosis                    Precautions / Restrictions Precautions Precautions: Fall     Mobility  Bed Mobility Overal bed mobility: Needs Assistance Bed Mobility: Sit to Supine  Sit to supine: Contact guard assist   Transfers Overall transfer level: Needs assistance Equipment used: Rolling walker (2 wheels) Transfers: Sit to/from Stand Sit to Stand: Contact guard assist, Min assist  General transfer comment: min assist at lowest bed height CGA from slightly elevated '    Ambulation/Gait Ambulation/Gait assistance: Contact guard assist Gait Distance (Feet): 30 Feet Assistive device: Rolling walker (2 wheels) Gait Pattern/deviations: Step-through pattern Gait velocity: decreased  General Gait Details: Pt ambulated only ~ 30 ft due to irregular elevated HR and desaturation with gait. pt ambulated to doorway of room and return with sao2 dropping to 84% and HR hitting 150s. pt visibly fatigued.    Balance Overall balance assessment: Needs assistance Sitting-balance support: Feet supported Sitting balance-Leahy Scale: Good Sitting balance - Comments: pt was I'ly seated EOB upon arrival to room   Standing balance support: Bilateral upper extremity supported, During functional activity, Reliant on assistive device for balance Standing balance-Leahy Scale: Fair Standing balance comment: relaint on RW  however no LOB observed     Communication Communication Communication: No apparent difficulties  Cognition Arousal: Alert Behavior During Therapy: WFL for tasks assessed/performed   PT - Cognitive impairments: No apparent impairments    PT - Cognition Comments: Pt is A and O x 4 and extremely pleasant and motivated Following commands: Intact      Cueing Cueing Techniques: Verbal cues         Pertinent Vitals/Pain Pain Assessment Pain Assessment: 0-10 Pain Score: 3  Pain Location: R thigh Pain Descriptors / Indicators: Cramping, Discomfort Pain Intervention(s): Limited activity within patient's tolerance, Monitored during session, Premedicated before session, Repositioned (MD ordered muscle relaxer for pt earlier in day and was given ~ 40 minutes prior to session)           PT Goals (current goals can now be found in the care plan section) Acute Rehab PT Goals Patient Stated Goal: go home Progress towards PT goals: Progressing toward goals    Frequency    Min 3X/week       AM-PAC PT "6 Clicks" Mobility   Outcome Measure  Help needed turning from your back to your side while in a flat bed without using bedrails?: None Help needed moving from lying on your back to sitting on the side of a flat bed without using bedrails?: A Little Help needed moving to and from a bed to a chair (including a wheelchair)?: A Little Help needed standing up from a chair using your arms (e.g., wheelchair or bedside chair)?: A Little Help needed to walk in hospital room?: A Little Help needed climbing 3-5 steps with a railing? : A Little 6 Click Score: 19  Time: 6578-4696 PT Time Calculation (min) (ACUTE ONLY): 17 min  Charges:    $Gait Training: 8-22 mins PT General Charges $$ ACUTE PT VISIT: 1 Visit                    Chester Costa PTA 12/09/23, 7:24 PM

## 2023-12-09 NOTE — Procedures (Signed)
 Interventional Radiology Procedure:   Indications: Pleural effusions  Procedure: US  guided left thoracentesis  Findings: Bilateral small pleural effusions. Removed 150 ml from left chest.  Complications: None     EBL: Less than 10 ml  Plan: Follow up CXR   Mary [name] R. Julietta Ogren, MD  Pager: [phone]

## 2023-12-09 NOTE — Care Management Important Message (Signed)
 Important Message  Patient Details  Name: Mary [name] MRN: [id number] Date of Birth: 01-17-1935   Important Message Given:  Yes - Medicare IM     Anise Kerns 12/09/2023, 11:43 AM

## 2023-12-09 NOTE — TOC Progression Note (Signed)
 Transition of Care [facility]) - Progression Note    Patient Details  Name: Mary [name] MRN: [id number] Date of Birth: 1934-11-24  Transition of Care [facility]) CM/SW Contact  Baird Bombard, RN Phone Number: 12/09/2023, 3:11 PM  Clinical Narrative:    Attempt to reach patient to discuss disharge plan for [facility]. Patient is off the floor.          Expected Discharge Plan and Services                                               Social Determinants of Health (SDOH) Interventions SDOH Screenings   Food Insecurity: No Food Insecurity (12/07/2023)  Housing: Low Risk  (12/07/2023)  Transportation Needs: No Transportation Needs (12/07/2023)  Utilities: Not At Risk (12/07/2023)  Financial Resource Strain: Low Risk  (09/28/2023)   Received from [facility]  Social Connections: Moderately Isolated (12/07/2023)  Tobacco Use: Low Risk  (12/08/2023)    Readmission Risk Interventions     No data to display

## 2023-12-09 NOTE — Telephone Encounter (Signed)
 Pharmacy Patient Advocate Encounter  Insurance verification completed.    The patient is insured through [place]. Patient has Medicare and is not eligible for a copay card, but may be able to apply for patient assistance or Medicare RX Payment Plan (Patient Must reach out to their plan, if eligible for payment plan), if available.    Ran test claim for ELIQUIS  5MG  TABLETS and the current 30 day co-pay is $40.00.   This test claim was processed through [facility]- copay amounts may vary at other pharmacies due to pharmacy/plan contracts, or as the patient moves through the different stages of their insurance plan.

## 2023-12-09 NOTE — Consult Note (Signed)
 PHARMACY - ANTICOAGULATION CONSULT NOTE  Pharmacy Consult for Apixaban  Indication: atrial fibrillation  Allergies  Allergen Reactions   Cephalexin Hives   Nitrofurantoin     Other reaction(s): Other (See Comments) Other Reaction: measles-like lesions   Sulfa Antibiotics Hives   Atorvastatin Other (See Comments)    Muscle spasms/ muscle pain    Patient Measurements: Height: 5\' 6"  (167.6 cm) Weight: 68.5 kg (151 lb 0.2 oz) IBW/kg (Calculated) : 59.3  Vital Signs: Temp: 98.2 F (36.8 C) (04/18 1152) BP: 120/52 (04/18 1152) Pulse Rate: 97 (04/18 1152)  Labs: Recent Labs    12/07/23 1242 12/07/23 1309 12/07/23 1502 12/08/23 0522 12/09/23 0510  HGB 13.5  --   --   --   --   HCT 42.9  --   --   --   --   PLT 189  --   --   --   --   LABPROT  --  14.3  --   --   --   INR  --  1.1  --   --   --   CREATININE 0.75  --   --  0.72 0.80  TROPONINIHS 10  --  8  --   --     Estimated Creatinine Clearance: 47.3 mL/min (by C-G formula based on SCr of 0.8 mg/dL).   Medical History: Past Medical History:  Diagnosis Date   Acid reflux    Anemia    Arthritis    Asthma    Atherosclerosis of abdominal aorta (HCC)    B12 deficiency    Clostridium difficile infection    H/O   Coronary artery disease    DDD (degenerative disc disease), lumbar    Depression    Diabetes (HCC)    type 2   Diabetic retinopathy (HCC)    Dysrhythmia    Heart murmur    HLD (hyperlipidemia)    HTN (hypertension)    Hypotension    when get up too quickly   Hypothyroidism    Pneumonia    PONV (postoperative nausea and vomiting)    Pulmonary nodule    right middle lobe on CT scan   Pure hypercholesterolemia    RA (rheumatoid arthritis) (HCC)    Sleep apnea    Spinal stenosis    Urinary urgency     Medications:  Medications Prior to Admission  Medication Sig Dispense Refill Last Dose/Taking   amLODipine  (NORVASC ) 10 MG tablet Take 1 tablet (10 mg total) by mouth daily. 30 tablet 0 Taking    azelastine (OPTIVAR) 0.05 % ophthalmic solution Place 1 drop into both eyes daily as needed (allergies).   Taking As Needed   busPIRone  (BUSPAR ) 10 MG tablet Take 10 mg by mouth 2 (two) times daily.   Taking   carvedilol  (COREG ) 6.25 MG tablet Take 6.25 mg by mouth 2 (two) times daily with a meal.   Taking   clopidogrel  (PLAVIX ) 75 MG tablet Take 1 tablet (75 mg total) by mouth daily at 6 (six) AM. 30 tablet 11 Taking   levothyroxine  (SYNTHROID , LEVOTHROID) 75 MCG tablet Take 75 mcg by mouth daily.    Taking   metFORMIN  (GLUCOPHAGE -XR) 500 MG 24 hr tablet Take 500 mg by mouth daily. With dinner   Taking   pantoprazole  (PROTONIX ) 20 MG tablet Take 20 mg by mouth daily.   Taking   PARoxetine  (PAXIL ) 40 MG tablet Take 40 mg by mouth every evening.   Taking   rosuvastatin  (CRESTOR ) 5 MG tablet  Take 1 tablet (5 mg total) by mouth daily. 30 tablet 0 Taking   traZODone  (DESYREL ) 50 MG tablet Take 1 tablet (50 mg total) by mouth at bedtime as needed for sleep. (Patient taking differently: Take 25 mg by mouth at bedtime as needed for sleep.) 30 tablet 0 Taking Differently   acetaminophen  (TYLENOL ) 500 MG tablet Take 500 mg by mouth every 6 (six) hours as needed for mild pain (pain score 1-3).      aspirin  EC 81 MG EC tablet Take 1 tablet (81 mg total) by mouth daily. Swallow whole. 30 tablet 11    cefUROXime  (CEFTIN ) 500 MG tablet Take 1 tablet (500 mg total) by mouth 2 (two) times daily with a meal. 8 tablet 0    ciprofloxacin  (CIPRO ) 500 MG tablet Take 1 tablet by mouth 2 (two) times daily.      cloNIDine  (CATAPRES ) 0.1 MG tablet Take 0.1 mg by mouth 2 (two) times daily.      hydrALAZINE  (APRESOLINE ) 100 MG tablet Take 1 tablet (100 mg total) by mouth 3 (three) times daily. (Patient taking differently: Take 50 mg by mouth 3 (three) times daily.) 90 tablet 0    Iron -Vitamin C  (VITRON-C) 65-125 MG TABS Take 1 tablet by mouth daily. 90 tablet 1    valsartan (DIOVAN) 320 MG tablet Take 320 mg by mouth  daily.      Vibegron  (GEMTESA ) 75 MG TABS Take 1 tablet (75 mg total) by mouth daily at 6 (six) AM.      vitamin B-12 (CYANOCOBALAMIN ) 500 MCG tablet Take 500 mcg by mouth every other day.      Scheduled:   busPIRone   10 mg Oral BID   carvedilol   12.5 mg Oral BID WC   Fe Fum-Vit C-Vit B12-FA  1 capsule Oral Daily   furosemide   40 mg Intravenous BID   gabapentin   200 mg Oral QHS   hydrALAZINE   50 mg Oral TID   irbesartan   300 mg Oral Daily   ketotifen   1 drop Both Eyes BID   levothyroxine   75 mcg Oral Daily   mirabegron  ER  25 mg Oral Daily   pantoprazole   20 mg Oral Daily   PARoxetine   40 mg Oral QPM   potassium chloride   40 mEq Oral BID WC   rosuvastatin   5 mg Oral Daily   sodium chloride  flush  3 mL Intravenous Q12H   Infusions:  PRN: acetaminophen , cyclobenzaprine , ondansetron  (ZOFRAN ) IV, sodium chloride  flush, traZODone  Anti-infectives (From admission, onward)    Start     Dose/Rate Route Frequency Ordered Stop   12/07/23 1300  levofloxacin  (LEVAQUIN ) IVPB 750 mg        750 mg 100 mL/hr over 90 Minutes Intravenous  Once 12/07/23 1254 12/07/23 1459       Assessment: Pharmacy consulted to start apixaban  for afib. CHA?DS?-VASc score is 7 with age, gender, Hx of CHF, PVD, and T2DM.   Goal of Therapy:  Monitor platelets by anticoagulation protocol: Yes   Plan:  Apixaban  5 mg BID.   Johanna Matto S Tudor Chandley 12/09/2023,11:57 AM

## 2023-12-09 NOTE — Progress Notes (Signed)
 [facility] CLINIC CARDIOLOGY PROGRESS NOTE   [name] MRN: [id number] DOB/AGE: 03-29-1935 89 y.o.  Admit date: 12/07/2023 Referring Physician Dr. Laneta Blunt Primary Physician Alla Amis, MD  Primary Cardiologist Dr. Hilarie Reason for Consultation AF RVR, heart failure   HPI: Mavery B Zayas is a 89 y.o. female  with a past medical history of coronary artery disease by CT scan, peripheral vascular disease s/p L CEApoorly controlled HTN, hyperlipidemia, type 2 diabetes, pulmonary hypertension who presented to the ED on 12/07/2023 for SOB and worsening edema. Found to be in AF RVR. Cardiology was consulted for further evaluation.    Patient reports that over the last 1 months she has had worsening shortness of breath, dyspnea on exertion and lower extremity edema.  Given her persistent symptoms she decided to come to the ED for further evaluation.  Workup in the ED notable for creatinine 0.75, potassium 4.0, sodium 136, hemoglobin 13.5, WBC 6.1.  Procalcitonin negative.  Lactic acid normal at 1.4.  Troponins trended 10, 8.  BNP elevated at 520.  Chest x-ray consistent with pulmonary edema.  UA consistent with UTI.  She was started on IV antibiotics and IV Lasix  in the ED.  Also found to be in atrial fibrillation RVR on EKG and was given IV digoxin  for rate control.  Interval History:  -Patient seen and examined at bedside, resting comfortably. No events overnight. Breathing and edema are much improved today.  Review of systems complete and found to be negative unless listed above    Vitals:   12/09/23 0411 12/09/23 0500 12/09/23 0532 12/09/23 0821  BP: 139/72   (!) 151/91  Pulse: 88   68  Resp: 18   16  Temp: 97.7 F (36.5 C)   98.7 F (37.1 C)  TempSrc:      SpO2: 93%   91%  Weight:  69 kg 68.5 kg   Height:         Intake/Output Summary (Last 24 hours) at 12/09/2023 1012 Last data filed at 12/08/2023 2132 Gross per 24 hour  Intake 243 ml  Output 500 ml   Net -257 ml     PHYSICAL EXAM General: awake, well nourished, in no acute distress. HEENT: Normocephalic and atraumatic. Neck: No JVD.  Lungs: Normal respiratory effort. Clear bilaterally to auscultation. No wheezes, crackles, rhonchi.  Heart: HRRR. Normal S1 and S2 without gallops or murmurs. Radial & DP pulses 2+ bilaterally. Abdomen: Non-distended appearing.  Msk: Normal strength and tone for age. Extremities: No clubbing, cyanosis or edema.   Neuro: Alert and oriented X 3. Psych: Mood appropriate, affect congruent.    LABS: Basic Metabolic Panel: Recent Labs    12/07/23 1502 12/08/23 0522 12/09/23 0510  NA  --  138 138  K  --  3.3* 2.8*  CL  --  96* 94*  CO2  --  36* 35*  GLUCOSE  --  102* 118*  BUN  --  10 14  CREATININE  --  0.72 0.80  CALCIUM   --  8.5* 8.8*  MG 1.9  --   --   PHOS 2.5  --   --    Liver Function Tests: Recent Labs    12/07/23 1242  AST 30  ALT 11  ALKPHOS 58  BILITOT 0.6  PROT 6.8  ALBUMIN 3.5   No results for input(s): LIPASE, AMYLASE in the last 72 hours. CBC: Recent Labs    12/07/23 1242  WBC 6.1  NEUTROABS 4.3  HGB 13.5  HCT 42.9  MCV 100.0  PLT 189   Cardiac Enzymes: Recent Labs    12/07/23 1242 12/07/23 1502  TROPONINIHS 10 8   BNP: Recent Labs    12/07/23 1309  BNP 520.4*   D-Dimer: No results for input(s): DDIMER in the last 72 hours. Hemoglobin A1C: No results for input(s): HGBA1C in the last 72 hours. Fasting Lipid Panel: No results for input(s): CHOL, HDL, LDLCALC, TRIG, CHOLHDL, LDLDIRECT in the last 72 hours. Thyroid Function Tests: Recent Labs    12/07/23 1502  TSH 2.976   Anemia Panel: No results for input(s): VITAMINB12, FOLATE, FERRITIN, TIBC, IRON , RETICCTPCT in the last 72 hours.  ECHOCARDIOGRAM COMPLETE Result Date: 12/08/2023    ECHOCARDIOGRAM REPORT   Patient Name:   Breeze B Mcgonagle Date of Exam: 12/08/2023 Medical Rec #:  [id number]     Height:       66.0  in Accession #:    [id number]    Weight:       158.3 lb Date of Birth:  1934/10/11     BSA:          1.811 m Patient Age:    89 years      BP:           141/74 mmHg Patient Gender: F             HR:           106 bpm. Exam Location:  [facility] Procedure: 2D Echo, Cardiac Doppler and Color Doppler (Both Spectral and Color            Flow Doppler were utilized during procedure). Indications:     CHF--acute diastolic I50.31  History:         Patient has prior history of Echocardiogram examinations, most                  recent 09/18/2021. CAD, Signs/Symptoms:Murmur; Risk                  Factors:Hypertension.  Sonographer:     Christopher Furnace Referring Phys:  [id number] CORT ONEIDA MANA Diagnosing Phys: Annalee Dahlton Hinde IMPRESSIONS  1. Left ventricular ejection fraction, by estimation, is 60 to 65%. The left ventricle has normal function. The left ventricle has no regional wall motion abnormalities. Left ventricular diastolic parameters are indeterminate.  2. Right ventricular systolic function is normal. The right ventricular size is normal.  3. Left atrial size was mildly dilated.  4. Large pleural effusion.  5. The mitral valve is normal in structure. Trivial mitral valve regurgitation. No evidence of mitral stenosis.  6. The aortic valve is normal in structure. Aortic valve regurgitation is not visualized. No aortic stenosis is present.  7. The inferior vena cava is normal in size with greater than 50% respiratory variability, suggesting right atrial pressure of 3 mmHg. FINDINGS  Left Ventricle: Left ventricular ejection fraction, by estimation, is 60 to 65%. The left ventricle has normal function. The left ventricle has no regional wall motion abnormalities. The left ventricular internal cavity size was normal in size. There is  no left ventricular hypertrophy. Left ventricular diastolic parameters are indeterminate. Right Ventricle: The right ventricular size is normal. No increase in right ventricular wall thickness. Right  ventricular systolic function is normal. Left Atrium: Left atrial size was mildly dilated. Right Atrium: Right atrial size was normal in size. Pericardium: There is no evidence of pericardial effusion. Mitral Valve: The mitral valve is normal in structure. Trivial mitral valve regurgitation.  No evidence of mitral valve stenosis. Tricuspid Valve: The tricuspid valve is normal in structure. Tricuspid valve regurgitation is mild. Aortic Valve: The aortic valve is normal in structure. Aortic valve regurgitation is not visualized. No aortic stenosis is present. Aortic valve mean gradient measures 4.0 mmHg. Aortic valve peak gradient measures 7.0 mmHg. Aortic valve area, by VTI measures 3.93 cm. Pulmonic Valve: The pulmonic valve was normal in structure. Pulmonic valve regurgitation is not visualized. Aorta: The aortic root is normal in size and structure. Venous: The inferior vena cava is normal in size with greater than 50% respiratory variability, suggesting right atrial pressure of 3 mmHg. IAS/Shunts: No atrial level shunt detected by color flow Doppler. Additional Comments: There is a large pleural effusion.  LEFT VENTRICLE PLAX 2D LVIDd:         3.70 cm LVIDs:         2.30 cm LV PW:         0.80 cm LV IVS:        1.60 cm LVOT diam:     2.00 cm LV SV:         77 LV SV Index:   42 LVOT Area:     3.14 cm  RIGHT VENTRICLE RV Basal diam:  3.60 cm RV Mid diam:    3.20 cm LEFT ATRIUM             Index        RIGHT ATRIUM           Index LA diam:        4.40 cm 2.43 cm/m   RA Area:     14.40 cm LA Vol (A2C):   41.4 ml 22.87 ml/m  RA Volume:   31.70 ml  17.51 ml/m LA Vol (A4C):   39.4 ml 21.76 ml/m LA Biplane Vol: 44.1 ml 24.36 ml/m  AORTIC VALVE AV Area (Vmax):    3.62 cm AV Area (Vmean):   3.86 cm AV Area (VTI):     3.93 cm AV Vmax:           132.00 cm/s AV Vmean:          87.100 cm/s AV VTI:            0.195 m AV Peak Grad:      7.0 mmHg AV Mean Grad:      4.0 mmHg LVOT Vmax:         152.00 cm/s LVOT Vmean:         107.000 cm/s LVOT VTI:          0.244 m LVOT/AV VTI ratio: 1.25  AORTA Ao Root diam: 3.40 cm MITRAL VALVE                TRICUSPID VALVE MV Area (PHT): 5.34 cm     TR Peak grad:   34.1 mmHg MV Decel Time: 142 msec     TR Vmax:        292.00 cm/s MV E velocity: 112.00 cm/s                             SHUNTS                             Systemic VTI:  0.24 m  Systemic Diam: 2.00 cm Annalee Casa Electronically signed by Annalee Casa Signature Date/Time: 12/08/2023/12:14:23 PM    Final    DG Chest 1 View Result Date: 12/08/2023 CLINICAL DATA:  89 year old female with CHF. EXAM: CHEST  1 VIEW COMPARISON:  Portable chest yesterday and earlier. FINDINGS: Portable AP semi upright view at 0650 hours. Stable to mildly improved lung volumes. Stable cardiac size and mediastinal contours. Calcified aortic atherosclerosis. Layering pleural effusions have not significantly changed, small to moderate on the left and small on the right. No superimposed pneumothorax, consolidation. No overt edema, and pulmonary vascularity appears regressed. Stable visualized osseous structures.  Paucity of bowel gas. IMPRESSION: Regressed/resolved pulmonary vascular congestion since yesterday with stable small to moderate pleural effusions. Electronically Signed   By: VEAR Hurst M.D.   On: 12/08/2023 07:03   DG Chest Port 1 View Result Date: 12/07/2023 CLINICAL DATA:  Questionable sepsis - evaluate for abnormality EXAM: PORTABLE CHEST 1 VIEW COMPARISON:  09/17/2021. FINDINGS: There are diffuse mild-to-moderately increased interstitial markings. There is left retrocardiac airspace opacity obscuring the left hemidiaphragm, descending thoracic aorta and blunting the left lateral costophrenic angle, suggesting combination of left lung atelectasis and/or consolidation with pleural effusion. Bilateral lungs are otherwise clear. There is blunting of right lateral costophrenic angle suggesting small right pleural  effusion. Stable cardio-mediastinal silhouette. No acute osseous abnormalities. The soft tissues are within normal limits. IMPRESSION: 1. Findings favor congestive heart failure/pulmonary edema. 2. There is left retrocardiac opacity, as described above. 3. Small bilateral pleural effusions. Electronically Signed   By: Ree Molt M.D.   On: 12/07/2023 13:35     ECHO pending   TELEMETRY reviewed by me 12/08/2023: atrial fibrillation rate 90-100s   EKG reviewed by me: AF RVR rate 112 bpm   Data reviewed by me 12/08/2023: last 24h vitals tele labs imaging I/O ED provider note, admission H&P   ASSESSMENT AND PLAN:  Principal Problem:   CHF (congestive heart failure) (HCC) Active Problems:   Acute respiratory failure with hypoxia (HCC)   Acute on chronic diastolic CHF (congestive heart failure) (HCC)   A-fib (HCC)   Rhyse B Ton is a 89 y.o. female  with a past medical history of coronary artery disease by CT scan, peripheral vascular disease s/p L CEA poorly controlled HTN, hyperlipidemia, type 2 diabetes, pulmonary hypertension who presented to the ED on 12/07/2023 for SOB and worsening edema. Found to be in AF RVR. Cardiology was consulted for further evaluation.    # Atrial fibrillation RVR # Presumed heart failure preserved EF Patient with worsening shortness of breath and lower extremity edema with elevated BNP of 520.  Chest x-ray consistent with pulmonary edema, improved today.  Also in atrial fibrillation RVR on initial EKG, right now improved. -Echo pending.  Further recommendations for medications pending echo results. -Continue IV Lasix  40 mg twice daily.  -Coreg  increased this morning to 12.5 mg twice daily by primary team.  Will defer additional digoxin . -Continue irbesartan  300 mg daily, hydralazine  50 mg 3 times daily, Coreg  as above. -Continue aspirin  81 mg daily, Crestor  5 mg daily. -Patient has history of severe nose bleeds and DAPT has been held for 2 months.  CHA?DS?-VASc score is 7 with age, gender, Hx of CHF, PVD, and T2DM would recommend long-term anticoagulation.  I will stop ASA and start Eliquis .    Shuntay Everetts, DO 12/09/2023, 10:12 AM [facility]

## 2023-12-10 DIAGNOSIS — I5033 Acute on chronic diastolic (congestive) heart failure: Secondary | ICD-10-CM | POA: Diagnosis not present

## 2023-12-10 LAB — BASIC METABOLIC PANEL WITH GFR
Anion gap: 8 (ref 5–15)
BUN: 16 mg/dL (ref 8–23)
CO2: 35 mmol/L — ABNORMAL HIGH (ref 22–32)
Calcium: 8.9 mg/dL (ref 8.9–10.3)
Chloride: 93 mmol/L — ABNORMAL LOW (ref 98–111)
Creatinine, Ser: 0.66 mg/dL (ref 0.44–1.00)
GFR, Estimated: >60 mL/min (ref >60)
Glucose, Bld: 121 mg/dL — ABNORMAL HIGH (ref 70–99)
Potassium: 3.7 mmol/L (ref 3.5–5.1)
Sodium: 136 mmol/L (ref 135–145)

## 2023-12-10 LAB — MAGNESIUM: Magnesium: 1.7 mg/dL (ref 1.7–2.4)

## 2023-12-10 MED ORDER — FUROSEMIDE 40 MG PO TABS
40.0000 mg | ORAL_TABLET | Freq: Two times a day (BID) | ORAL | Status: DC
  Administered 2023-12-11 – 2023-12-16 (×11): 40 mg via ORAL
  Filled 2023-12-10 (×11): qty 1

## 2023-12-10 MED ORDER — CARVEDILOL 25 MG PO TABS
25.0000 mg | ORAL_TABLET | Freq: Two times a day (BID) | ORAL | Status: DC
  Administered 2023-12-10 – 2023-12-15 (×12): 25 mg via ORAL
  Filled 2023-12-10 (×13): qty 1

## 2023-12-10 MED ORDER — MAGNESIUM SULFATE 2 GM/50ML IV SOLN
2.0000 g | Freq: Once | INTRAVENOUS | Status: AC
Start: 2023-12-10 — End: 2023-12-10
  Administered 2023-12-10: 2 g via INTRAVENOUS
  Filled 2023-12-10: qty 50

## 2023-12-10 NOTE — Plan of Care (Signed)
  Problem: Education: Goal: Knowledge of General Education information will improve Description: Including pain rating scale, medication(s)/side effects and non-pharmacologic comfort measures Outcome: Progressing   Problem: Health Behavior/Discharge Planning: Goal: Ability to manage health-related needs will improve Outcome: Progressing   Problem: Clinical Measurements: Goal: Ability to maintain clinical measurements within normal limits will improve Outcome: Progressing Goal: Will remain free from infection Outcome: Progressing Goal: Diagnostic test results will improve Outcome: Progressing Goal: Cardiovascular complication will be avoided Outcome: Progressing   Problem: Activity: Goal: Risk for activity intolerance will decrease Outcome: Progressing   Problem: Nutrition: Goal: Adequate nutrition will be maintained Outcome: Progressing   Problem: Elimination: Goal: Will not experience complications related to bowel motility Outcome: Progressing Goal: Will not experience complications related to urinary retention Outcome: Progressing   Problem: Safety: Goal: Ability to remain free from injury will improve Outcome: Progressing

## 2023-12-10 NOTE — Progress Notes (Signed)
   12/10/23 0805  Assess: MEWS Score  Temp 98.2 F (36.8 C)  BP (!) 155/85  MAP (mmHg) 103  ECG Heart Rate (!) 113  Resp 18  Level of Consciousness Alert  SpO2 94 %  O2 Device Nasal Cannula  O2 Flow Rate (L/min) 2 L/min  Assess: MEWS Score  MEWS Temp 0  MEWS Systolic 0  MEWS Pulse 2  MEWS RR 0  MEWS LOC 0  MEWS Score 2  MEWS Score Color Yellow  Assess: if the MEWS score is Yellow or Red  Were vital signs accurate and taken at a resting state? Yes  Does the patient meet 2 or more of the SIRS criteria? No  MEWS guidelines implemented  Yes, yellow  Treat  MEWS Interventions Considered administering scheduled or prn medications/treatments as ordered  Take Vital Signs  Increase Vital Sign Frequency  Yellow: Q2hr x1, continue Q4hrs until patient remains green for 12hrs  Escalate  MEWS: Escalate Yellow: Discuss with charge nurse and consider notifying provider and/or RRT  Notify: Charge Nurse/RN  Name of Charge Nurse/RN Notified John, RN  Provider Notification  Provider Name/Title Dr. Authur Leghorn  Date Provider Notified 12/10/23  Time Provider Notified 0827  Method of Notification Page  Notification Reason Other (Comment) (Yellow MEWS)  Provider response See new orders  Date of Provider Response 12/10/23  Time of Provider Response [phone]  Assess: SIRS CRITERIA  SIRS Temperature  0  SIRS Respirations  0  SIRS Pulse 1  SIRS WBC 0  SIRS Score Sum  1   Notified Dr. Authur Leghorn of elevated heart rate 113-130 and yellow MEWS. MD acknowledged and placed new order.

## 2023-12-10 NOTE — Progress Notes (Signed)
 [facility] CLINIC CARDIOLOGY PROGRESS NOTE   Patient ID: Vaudie B Bittick MRN: [id number] DOB/AGE: 01-24-1935 89 y.o.  Admit date: 12/07/2023 Referring Physician Dr. Melodi Sprung Primary Physician Monique Ano, MD  Primary Cardiologist Dr. Larinda Plover Reason for Consultation AF RVR, heart failure   HPI: Cyndra B Faria is a 89 y.o. female  with a past medical history of coronary artery disease by CT scan, peripheral vascular disease s/p L CEApoorly controlled HTN, hyperlipidemia, type 2 diabetes, pulmonary hypertension who presented to the ED on 12/07/2023 for SOB and worsening edema. Found to be in AF RVR. Cardiology was consulted for further evaluation.    Interval History:  -Patient seen and examined at bedside, resting comfortably. No events overnight. Breathing and edema are much improved today.  Review of systems complete and found to be negative unless listed above    Vitals:   12/10/23 0503 12/10/23 0805 12/10/23 0845 12/10/23 1100  BP: 134/79 (!) 155/85 (!) 155/85 (!) 109/57  Pulse: (!) 104  (!) 113 81  Resp: 18 18  17   Temp: 99.1 F (37.3 C) 98.2 F (36.8 C)  98.1 F (36.7 C)  TempSrc: Oral Oral  Oral  SpO2: 94% 94%  91%  Weight:      Height:         Intake/Output Summary (Last 24 hours) at 12/10/2023 1157 Last data filed at 12/10/2023 1025 Gross per 24 hour  Intake 483 ml  Output --  Net 483 ml     PHYSICAL EXAM General: awake, well nourished, in no acute distress. HEENT: Normocephalic and atraumatic. Neck: No JVD.  Lungs: Normal respiratory effort. Clear bilaterally to auscultation. No wheezes, crackles, rhonchi.  Heart: HRRR. Normal S1 and S2 without gallops or murmurs. Radial & DP pulses 2+ bilaterally. Abdomen: Non-distended appearing.  Msk: Normal strength and tone for age. Extremities: No clubbing, cyanosis or edema.   Neuro: Alert and oriented X 3. Psych: Mood appropriate, affect congruent.    LABS: Basic Metabolic Panel: Recent Labs     12/07/23 1502 12/08/23 0522 12/09/23 0510 12/10/23 0843  NA  --    < > 138 136  K  --    < > 2.8* 3.7  CL  --    < > 94* 93*  CO2  --    < > 35* 35*  GLUCOSE  --    < > 118* 121*  BUN  --    < > 14 16  CREATININE  --    < > 0.80 0.66  CALCIUM   --    < > 8.8* 8.9  MG 1.9  --   --  1.7  PHOS 2.5  --   --   --    < > = values in this interval not displayed.   Liver Function Tests: Recent Labs    12/07/23 1242  AST 30  ALT 11  ALKPHOS 58  BILITOT 0.6  PROT 6.8  ALBUMIN 3.5   No results for input(s): "LIPASE", "AMYLASE" in the last 72 hours. CBC: Recent Labs    12/07/23 1242  WBC 6.1  NEUTROABS 4.3  HGB 13.5  HCT 42.9  MCV 100.0  PLT 189   Cardiac Enzymes: Recent Labs    12/07/23 1242 12/07/23 1502  TROPONINIHS 10 8   BNP: Recent Labs    12/07/23 1309  BNP 520.4*   D-Dimer: No results for input(s): "DDIMER" in the last 72 hours. Hemoglobin A1C: No results for input(s): "HGBA1C" in the last 72 hours. Fasting  Lipid Panel: No results for input(s): "CHOL", "HDL", "LDLCALC", "TRIG", "CHOLHDL", "LDLDIRECT" in the last 72 hours. Thyroid Function Tests: Recent Labs    12/07/23 1502  TSH 2.976   Anemia Panel: No results for input(s): "VITAMINB12", "FOLATE", "FERRITIN", "TIBC", "IRON ", "RETICCTPCT" in the last 72 hours.  US  THORACENTESIS ASP PLEURAL SPACE W/IMG GUIDE Result Date: 12/09/2023 INDICATION: Acute hypoxic respiratory failure.  Request for a thoracentesis. EXAM: ULTRASOUND GUIDED LEFT THORACENTESIS MEDICATIONS: None. COMPLICATIONS: None immediate. PROCEDURE: An ultrasound guided thoracentesis was thoroughly discussed with the patient and questions answered. The benefits, risks, alternatives and complications were also discussed. The patient understands and wishes to proceed with the procedure. Written consent was obtained. Ultrasound was performed to localize and mark an adequate pocket of fluid in the left chest. The area was then prepped and draped in  the normal sterile fashion. 1% Lidocaine  was used for local anesthesia. Under ultrasound guidance a 6 Fr Safe-T-Centesis catheter was introduced. Thoracentesis was performed. The catheter was removed and a dressing applied. FINDINGS: Ultrasound demonstrated small bilateral pleural effusions. Left pleural effusion was slightly larger. A total of approximately 150 mL of yellow fluid was removed. Samples were sent to the laboratory as requested by the clinical team. IMPRESSION: Successful ultrasound guided left thoracentesis yielding 150 mL of pleural fluid. Electronically Signed   By: Elene Griffes M.D.   On: 12/09/2023 16:37   DG Chest Port 1 View Result Date: 12/09/2023 CLINICAL DATA:  142230 Pleural effusion [id number] Status post thoracentesis [id number] EXAM: PORTABLE CHEST 1 VIEW COMPARISON:  12/08/2023. FINDINGS: There is small left pleural effusion, decreased since the prior study, status post thoracentesis. No pneumothorax. There is trace right pleural effusion, which is similar to the prior study. Bilateral lung fields are otherwise clear. Stable cardio-mediastinal silhouette. No acute osseous abnormalities. The soft tissues are within normal limits. IMPRESSION: Small left pleural effusion, decreased since the prior study, status post thoracentesis. No pneumothorax. Electronically Signed   By: Beula Brunswick M.D.   On: 12/09/2023 15:45     ECHO pending   TELEMETRY reviewed by me 12/08/2023: atrial fibrillation rate 90-100s   EKG reviewed by me: AF RVR rate 112 bpm   Data reviewed by me 12/08/2023: last 24h vitals tele labs imaging I/O ED provider note, admission H&P   ASSESSMENT AND PLAN:  Principal Problem:   CHF (congestive heart failure) (HCC) Active Problems:   Acute respiratory failure with hypoxia (HCC)   Acute on chronic diastolic CHF (congestive heart failure) (HCC)   A-fib (HCC)   Tayte B Rasch is a 89 y.o. female  with a past medical history of coronary artery disease by CT scan,  peripheral vascular disease s/p L CEA poorly controlled HTN, hyperlipidemia, type 2 diabetes, pulmonary hypertension who presented to the ED on 12/07/2023 for SOB and worsening edema. Found to be in AF RVR. Cardiology was consulted for further evaluation.    # Atrial fibrillation RVR # Presumed heart failure preserved EF Patient with worsening shortness of breath and lower extremity edema with elevated BNP of 520.  Chest x-ray consistent with pulmonary edema, improved today.  Also in atrial fibrillation RVR on initial EKG, right now improved. -Echo pending.  Further recommendations for medications pending echo results. -Continue IV Lasix  40 mg twice daily. Ok to switch to PO. -Coreg  increased by primary team - rates are much better controlled at this time.  Will defer additional digoxin . -Continue irbesartan  300 mg daily, hydralazine  50 mg 3 times daily, Coreg  as above. -Continue  aspirin  81 mg daily, Crestor  5 mg daily. -Patient has history of severe nose bleeds and DAPT has been held for 2 months. CHA?DS?-VASc score is 7 with age, gender, Hx of CHF, PVD, and T2DM would recommend long-term anticoagulation.  ASA discontinued. Continue Eliquis , monitor for bleeding    Isabell Manzanilla, DO 12/10/2023, 11:57 AM [facility] Cardiology

## 2023-12-10 NOTE — Progress Notes (Signed)
 PROGRESS NOTE    Mary [name]   YQM:[id number] DOB: 05-16-1935  DOA: 12/07/2023 Date of Service: 12/10/23 which is hospital day 3  PCP: Monique Ano, MD    Hospital course / significant events:   HPI:  Mary [name] is a 89 y.o. female with medical history significant of HTN, chronic HFpEF, CAD, hypothyroidism, recurrent nosebleed off dual antiplatelet therapy, question of PAF, presented from home with worsening of cough shortness of breath and peripheral edema. Onset 7 days prior. Came to ED when noting SOB at rest.    04/16: to ED. CXR pulmonary congestion and bilateral pleural effusion, elevated HR and Afib RVR on EKG. Admitted to hospitalist service for acute on chronic HFpEF and Afib RVR. IV lasix , digoxin  loading IV also ordered 04/17: HR remains labile, occasionally sustaining 130s-140s. Increase beta blocker. Echo results pending. Cardiology consulted re: continuation digoxin  vs other, HR responding to beta blocker so will not continue digoxin  04/18: thoracentesis removed 150 mL. HR still up a bit, monitoring  04/19: transition to po lasix , coreg  increased again today and HR is improved. Restarted Eliquis , dc ASA per cardiology.      Consultants:  Cardiology   Procedures/Surgeries: 12/09/23 L thoracentesis      ASSESSMENT & PLAN:   Acute hypoxic respiratory failure Due to pulmonary edema and pleural effusion from CHF O2 supplement, wean as able Treat underlying cause(s)  L Pleural effusion - noted large on Echo, moderate on CXR Decreased lung sounds L lung base likely effusion Thoracentesis 04/18 removed 150 mL L chest   Acute on chronic HFpEF decompensation Essential HTN Question secondary to A-fib with RVR or may be primary/initial problem Echocardiogram --> EF 60-65%, no RWMA, indeterminate diastolic parameters but suspect some diastolic dysfunction given clinical CHF  Treat Afib as noted IV Lasix  --> po  Coreg  12.5 mg twice daily --> 25 mg bid  today  Irbesartan  300 mg daily Hydralazine  50 mg 3 times daily Aspirin  81 mg daily d/c and restart Eliquis  5 mg bid per cardiology  Crestor  5 mg daily. Strict I&O's   PAF with RVR RVR resolved  Question secondary to CHF or may be primary/initial problem Coreg  12.5 mg twice daily --> 25 mg bid today  defer additional digoxin . Follow potassium, magnesium  and phosphorus levels and correct electrolytes as needed CHADS2VASC = 7, indication for systemic anticoagulation.  However patient reported that she has had repeated severe nosebleed recently and antiplatelet therapy of dual antiplatelet aspirin  Plavix  have been held for 2 months. Aspirin  81 mg daily d/c and restart Eliquis  5 mg bid per cardiology    SIRS assoc w/ acute resp fail, Afib, CHF Treat as above No leukocytosis/infection, no sepsis   Abnormal UA  Asymptomatic bacteriuria  UCx no growth VS more c/w SIRS/Afib/CHF rather than sepsis, denies urinary symptoms, will not treat Follow UCx --> no growth   Hypokalemia Replace as needed - ordered 40 mg yesterday for breakfast and lunch yesterday, she only got lunch dose yesterday and then breakfast dose this AM... Monitor BMP  Hypothyroidism TSH WNL, not likely to be contributing to cardiac issues as above  Continue Synthroid    Anxiety/depression Continue Prozac and trazodone  at bedtime   IIDM A1c 6.1 Continue diet control   Deconditioning PT evaluation - pt very weak and likely inadequate reserve, she is declining SNF rehab, reassess tomorrow     overweight based on BMI: Body mass index is 25.55 kg/m.  Underweight - under 18  overweight - 25 to 29 obese -  30 or more Class 1 obesity: BMI of 30.0 to 34 Class 2 obesity: BMI of 35.0 to 39 Class 3 obesity: BMI of 40.0 to 49 Super Morbid Obesity: BMI 50-59 Super-super Morbid Obesity: BMI 60+ Significantly low or high BMI is associated with higher medical risk.  Weight management advised as adjunct to other disease  management and risk reduction treatments    DVT prophylaxis: Eliquis   IV fluids: no continuous IV fluids  Nutrition: cardiac diet Central lines / other devices: none  Code Status: DNR ACP documentation reviewed:  none on file in [facility]  TOC needs: TBD, pend PT/OT eval but pt states she would not want to go to rehab/SNF under any circumstance  Medical barriers to dispo: IV rate control and diuresis. Expected medical readiness for discharge potentially 04/19             Subjective / Brief ROS:  Patient reports breathing and edema are better She is concerned about being really tired today, isn't getting out of bed  Denies CP/SOB at rest No palpations  Pain controlled.  Denies new weakness.  Tolerating diet.  Reports no concerns w/ urination/defecation.   Family Communication: called daughter and all questions answered 12/10/23 3:58 PM     Objective Findings:  Vitals:   12/10/23 0805 12/10/23 0845 12/10/23 1100 12/10/23 1527  BP: (!) 155/85 (!) 155/85 (!) 109/57 120/61  Pulse:  (!) 113 81 92  Resp: 18  17 16   Temp: 98.2 F (36.8 C)  98.1 F (36.7 C) 97.6 F (36.4 C)  TempSrc: Oral  Oral Oral  SpO2: 94%  91% 96%  Weight:      Height:        Intake/Output Summary (Last 24 hours) at 12/10/2023 1558 Last data filed at 12/10/2023 1536 Gross per 24 hour  Intake 519.91 ml  Output --  Net 519.91 ml   Filed Weights   12/09/23 0500 12/09/23 0532 12/10/23 0500  Weight: 69 kg 68.5 kg 70.4 kg    Examination:  Physical Exam Constitutional:      General: She is not in acute distress. Cardiovascular:     Rate and Rhythm: Tachycardia present. Rhythm irregular.  Pulmonary:     Effort: Pulmonary effort is normal.     Breath sounds: No decreased breath sounds or rales (improved).  Musculoskeletal:     Right lower leg: Edema (trace/improved) present.     Left lower leg: Edema (trace/improved) present.     Comments: Pt reports LE edema has improved compared to  yesterday   Neurological:     General: No focal deficit present.     Mental Status: She is alert and oriented to person, place, and time.  Psychiatric:        Mood and Affect: Mood normal.        Behavior: Behavior normal.          Scheduled Medications:   apixaban   5 mg Oral BID   busPIRone   10 mg Oral BID   carvedilol   25 mg Oral BID WC   Fe Fum-Vit C-Vit B12-FA  1 capsule Oral Daily   [START ON 12/11/2023] furosemide   40 mg Oral BID   gabapentin   200 mg Oral QHS   hydrALAZINE   50 mg Oral TID   irbesartan   300 mg Oral Daily   ketotifen   1 drop Both Eyes BID   levothyroxine   75 mcg Oral Daily   mirabegron  ER  25 mg Oral Daily   pantoprazole   20 mg  Oral Daily   PARoxetine   40 mg Oral QPM   rosuvastatin   5 mg Oral Daily   sodium chloride  flush  3 mL Intravenous Q12H    Continuous Infusions:    PRN Medications:  acetaminophen , cyclobenzaprine , ondansetron  (ZOFRAN ) IV, sodium chloride  flush, traZODone   Antimicrobials from admission:  Anti-infectives (From admission, onward)    Start     Dose/Rate Route Frequency Ordered Stop   12/07/23 1300  levofloxacin  (LEVAQUIN ) IVPB 750 mg        750 mg 100 mL/hr over 90 Minutes Intravenous  Once 12/07/23 1254 12/07/23 1459           Data Reviewed:  I have personally reviewed the following...  CBC: Recent Labs  Lab 12/07/23 1242  WBC 6.1  NEUTROABS 4.3  HGB 13.5  HCT 42.9  MCV 100.0  PLT 189   Basic Metabolic Panel: Recent Labs  Lab 12/07/23 1242 12/07/23 1502 12/08/23 0522 12/09/23 0510 12/10/23 0843  NA 136  --  138 138 136  K 4.0  --  3.3* 2.8* 3.7  CL 103  --  96* 94* 93*  CO2 23  --  36* 35* 35*  GLUCOSE 170*  --  102* 118* 121*  BUN 12  --  10 14 16   CREATININE 0.75  --  0.72 0.80 0.66  CALCIUM  8.8*  --  8.5* 8.8* 8.9  MG  --  1.9  --   --  1.7  PHOS  --  2.5  --   --   --    GFR: Estimated Creatinine Clearance: 47.3 mL/min (by C-G formula based on SCr of 0.66 mg/dL). Liver Function  Tests: Recent Labs  Lab 12/07/23 1242  AST 30  ALT 11  ALKPHOS 58  BILITOT 0.6  PROT 6.8  ALBUMIN 3.5   No results for input(s): "LIPASE", "AMYLASE" in the last 168 hours. No results for input(s): "AMMONIA" in the last 168 hours. Coagulation Profile: Recent Labs  Lab 12/07/23 1309  INR 1.1   Cardiac Enzymes: No results for input(s): "CKTOTAL", "CKMB", "CKMBINDEX", "TROPONINI" in the last 168 hours. BNP (last 3 results) No results for input(s): "PROBNP" in the last 8760 hours. HbA1C: No results for input(s): "HGBA1C" in the last 72 hours. CBG: Recent Labs  Lab 12/08/23 1208  GLUCAP 117*   Lipid Profile: No results for input(s): "CHOL", "HDL", "LDLCALC", "TRIG", "CHOLHDL", "LDLDIRECT" in the last 72 hours. Thyroid Function Tests: No results for input(s): "TSH", "T4TOTAL", "FREET4", "T3FREE", "THYROIDAB" in the last 72 hours.  Anemia Panel: No results for input(s): "VITAMINB12", "FOLATE", "FERRITIN", "TIBC", "IRON ", "RETICCTPCT" in the last 72 hours. Most Recent Urinalysis On File:     Component Value Date/Time   COLORURINE YELLOW (A) 12/07/2023 1309   APPEARANCEUR HAZY (A) 12/07/2023 1309   APPEARANCEUR Hazy (A) 05/09/2023 1300   LABSPEC 1.005 12/07/2023 1309   LABSPEC 1.005 12/29/2013 0119   PHURINE 6.0 12/07/2023 1309   GLUCOSEU NEGATIVE 12/07/2023 1309   GLUCOSEU Negative 12/29/2013 0119   HGBUR SMALL (A) 12/07/2023 1309   BILIRUBINUR NEGATIVE 12/07/2023 1309   BILIRUBINUR Negative 05/09/2023 1300   BILIRUBINUR Negative 12/29/2013 0119   KETONESUR NEGATIVE 12/07/2023 1309   PROTEINUR NEGATIVE 12/07/2023 1309   NITRITE NEGATIVE 12/07/2023 1309   LEUKOCYTESUR LARGE (A) 12/07/2023 1309   LEUKOCYTESUR 1+ 12/29/2013 0119   Sepsis Labs: @LABRCNTIP (procalcitonin:4,lacticidven:4) Microbiology: Recent Results (from the past 240 hours)  Resp panel by RT-PCR (RSV, Flu A&B, Covid) Anterior Nasal Swab     Status: None  Collection Time: 12/07/23  1:09 PM    Specimen: Anterior Nasal Swab  Result Value Ref Range Status   SARS Coronavirus 2 by RT PCR NEGATIVE NEGATIVE Final    Comment: (NOTE) SARS-CoV-2 target nucleic acids are NOT DETECTED.  The SARS-CoV-2 RNA is generally detectable in upper respiratory specimens during the acute phase of infection. The lowest concentration of SARS-CoV-2 viral copies this assay can detect is 138 copies/mL. A negative result does not preclude SARS-Cov-2 infection and should not be used as the sole basis for treatment or other patient management decisions. A negative result may occur with  improper specimen collection/handling, submission of specimen other than nasopharyngeal swab, presence of viral mutation(s) within the areas targeted by this assay, and inadequate number of viral copies(<138 copies/mL). A negative result must be combined with clinical observations, patient history, and epidemiological information. The expected result is Negative.  Fact Sheet for Patients:  BloggerCourse.com  Fact Sheet for Healthcare Providers:  SeriousBroker.it  This test is no t yet approved or cleared by the [place]  FDA and  has been authorized for detection and/or diagnosis of SARS-CoV-2 by FDA under an Emergency Use Authorization (EUA). This EUA will remain  in effect (meaning this test can be used) for the duration of the COVID-19 declaration under Section 564(b)(1) of the Act, 21 U.S.C.section 360bbb-3(b)(1), unless the authorization is terminated  or revoked sooner.       Influenza A by PCR NEGATIVE NEGATIVE Final   Influenza B by PCR NEGATIVE NEGATIVE Final    Comment: (NOTE) The Xpert Xpress SARS-CoV-2/FLU/RSV plus assay is intended as an aid in the diagnosis of influenza from Nasopharyngeal swab specimens and should not be used as a sole basis for treatment. Nasal washings and aspirates are unacceptable for Xpert Xpress  SARS-CoV-2/FLU/RSV testing.  Fact Sheet for Patients: BloggerCourse.com  Fact Sheet for Healthcare Providers: SeriousBroker.it  This test is not yet approved or cleared by the [place]  FDA and has been authorized for detection and/or diagnosis of SARS-CoV-2 by FDA under an Emergency Use Authorization (EUA). This EUA will remain in effect (meaning this test can be used) for the duration of the COVID-19 declaration under Section 564(b)(1) of the Act, 21 U.S.C. section 360bbb-3(b)(1), unless the authorization is terminated or revoked.     Resp Syncytial Virus by PCR NEGATIVE NEGATIVE Final    Comment: (NOTE) Fact Sheet for Patients: BloggerCourse.com  Fact Sheet for Healthcare Providers: SeriousBroker.it  This test is not yet approved or cleared by the [place]  FDA and has been authorized for detection and/or diagnosis of SARS-CoV-2 by FDA under an Emergency Use Authorization (EUA). This EUA will remain in effect (meaning this test can be used) for the duration of the COVID-19 declaration under Section 564(b)(1) of the Act, 21 U.S.C. section 360bbb-3(b)(1), unless the authorization is terminated or revoked.  Performed at [facility], [address] [place], [place] [address]   Blood Culture (routine x 2)     Status: None (Preliminary result)   Collection Time: 12/07/23  1:09 PM   Specimen: BLOOD  Result Value Ref Range Status   Specimen Description   Final    BLOOD Blood Culture results may not be optimal due to an inadequate volume of blood received in culture bottles   Special Requests   Final    BOTTLES DRAWN AEROBIC AND ANAEROBIC RIGHT ANTECUBITAL   Culture   Final    NO GROWTH 3 DAYS Performed at [facility], [address]  [place], [place] [address]    Report Status PENDING  Incomplete  Blood Culture (routine x 2)     Status: None  (Preliminary result)   Collection Time: 12/07/23  1:09 PM   Specimen: BLOOD  Result Value Ref Range Status   Specimen Description   Final    BLOOD Blood Culture results may not be optimal due to an inadequate volume of blood received in culture bottles   Special Requests   Final    BOTTLES DRAWN AEROBIC AND ANAEROBIC LEFT ANTECUBITAL   Culture   Final    NO GROWTH 3 DAYS Performed at [facility], [address] [place], [place] [address]    Report Status PENDING  Incomplete  Urine Culture     Status: None   Collection Time: 12/07/23  1:09 PM   Specimen: Urine, Clean Catch  Result Value Ref Range Status   Specimen Description   Final    URINE, CLEAN CATCH Performed at [facility], [address] [place], [place] [address]    Special Requests   Final    NONE Reflexed from [id number] Performed at [facility], [address] [place] [place], [place] [address]    Culture   Final    NO GROWTH Performed at [facility], [address] [place], [place] [address]    Report Status 12/08/2023 FINAL  Final      Radiology Studies last 3 days: US  THORACENTESIS ASP PLEURAL SPACE W/IMG GUIDE Result Date: 12/09/2023 INDICATION: Acute hypoxic respiratory failure.  Request for a thoracentesis. EXAM: ULTRASOUND GUIDED LEFT THORACENTESIS MEDICATIONS: None. COMPLICATIONS: None immediate. PROCEDURE: An ultrasound guided thoracentesis was thoroughly discussed with the patient and questions answered. The benefits, risks, alternatives and complications were also discussed. The patient understands and wishes to proceed with the procedure. Written consent was obtained. Ultrasound was performed to localize and mark an adequate pocket of fluid in the left chest. The area was then prepped and draped in the normal sterile fashion. 1% Lidocaine  was used for local anesthesia. Under ultrasound guidance a 6 Fr Safe-T-Centesis catheter was introduced. Thoracentesis was performed. The catheter  was removed and a dressing applied. FINDINGS: Ultrasound demonstrated small bilateral pleural effusions. Left pleural effusion was slightly larger. A total of approximately 150 mL of yellow fluid was removed. Samples were sent to the laboratory as requested by the clinical team. IMPRESSION: Successful ultrasound guided left thoracentesis yielding 150 mL of pleural fluid. Electronically Signed   By: Elene Griffes M.D.   On: 12/09/2023 16:37   DG Chest Port 1 View Result Date: 12/09/2023 CLINICAL DATA:  142230 Pleural effusion [id number] [id number] Status post thoracentesis [id number] EXAM: PORTABLE CHEST 1 VIEW COMPARISON:  12/08/2023. FINDINGS: There is small left pleural effusion, decreased since the prior study, status post thoracentesis. No pneumothorax. There is trace right pleural effusion, which is similar to the prior study. Bilateral lung fields are otherwise clear. Stable cardio-mediastinal silhouette. No acute osseous abnormalities. The soft tissues are within normal limits. IMPRESSION: Small left pleural effusion, decreased since the prior study, status post thoracentesis. No pneumothorax. Electronically Signed   By: Beula Brunswick M.D.   On: 12/09/2023 15:45   ECHOCARDIOGRAM COMPLETE Result Date: 12/08/2023    ECHOCARDIOGRAM REPORT   Patient Name:   Solyana B Stenglein Date of Exam: 12/08/2023 Medical Rec #:  [id number]     Height:       66.0 in Accession #:    [id number]    Weight:  158.3 lb Date of Birth:  [birth year]     BSA:          1.811 m Patient Age:    88 years      BP:           141/74 mmHg Patient Gender: F             HR:           106 bpm. Exam Location:  [facility] Procedure: 2D Echo, Cardiac Doppler and Color Doppler (Both Spectral and Color            Flow Doppler were utilized during procedure). Indications:     CHF--acute diastolic I50.31  History:         Patient has prior history of Echocardiogram examinations, most                  recent 09/18/2021. CAD, Signs/Symptoms:Murmur; Risk                   Factors:Hypertension.  Sonographer:     Broadus Canes Referring Phys:  [id number] [place] Diagnosing Phys: Lanell Pinta Custovic IMPRESSIONS  1. Left ventricular ejection fraction, by estimation, is 60 to 65%. The left ventricle has normal function. The left ventricle has no regional wall motion abnormalities. Left ventricular diastolic parameters are indeterminate.  2. Right ventricular systolic function is normal. The right ventricular size is normal.  3. Left atrial size was mildly dilated.  4. Large pleural effusion.  5. The mitral valve is normal in structure. Trivial mitral valve regurgitation. No evidence of mitral stenosis.  6. The aortic valve is normal in structure. Aortic valve regurgitation is not visualized. No aortic stenosis is present.  7. The inferior vena cava is normal in size with greater than 50% respiratory variability, suggesting right atrial pressure of 3 mmHg. FINDINGS  Left Ventricle: Left ventricular ejection fraction, by estimation, is 60 to 65%. The left ventricle has normal function. The left ventricle has no regional wall motion abnormalities. The left ventricular internal cavity size was normal in size. There is  no left ventricular hypertrophy. Left ventricular diastolic parameters are indeterminate. Right Ventricle: The right ventricular size is normal. No increase in right ventricular wall thickness. Right ventricular systolic function is normal. Left Atrium: Left atrial size was mildly dilated. Right Atrium: Right atrial size was normal in size. Pericardium: There is no evidence of pericardial effusion. Mitral Valve: The mitral valve is normal in structure. Trivial mitral valve regurgitation. No evidence of mitral valve stenosis. Tricuspid Valve: The tricuspid valve is normal in structure. Tricuspid valve regurgitation is mild. Aortic Valve: The aortic valve is normal in structure. Aortic valve regurgitation is not visualized. No aortic stenosis is present. Aortic valve mean gradient  measures 4.0 mmHg. Aortic valve peak gradient measures 7.0 mmHg. Aortic valve area, by VTI measures 3.93 cm. Pulmonic Valve: The pulmonic valve was normal in structure. Pulmonic valve regurgitation is not visualized. Aorta: The aortic root is normal in size and structure. Venous: The inferior vena cava is normal in size with greater than 50% respiratory variability, suggesting right atrial pressure of 3 mmHg. IAS/Shunts: No atrial level shunt detected by color flow Doppler. Additional Comments: There is a large pleural effusion.  LEFT VENTRICLE PLAX 2D LVIDd:         3.70 cm LVIDs:         2.30 cm LV PW:         0.80 cm LV IVS:  1.60 cm LVOT diam:     2.00 cm LV SV:         77 LV SV Index:   42 LVOT Area:     3.14 cm  RIGHT VENTRICLE RV Basal diam:  3.60 cm RV Mid diam:    3.20 cm LEFT ATRIUM             Index        RIGHT ATRIUM           Index LA diam:        4.40 cm 2.43 cm/m   RA Area:     14.40 cm LA Vol (A2C):   41.4 ml 22.87 ml/m  RA Volume:   31.70 ml  17.51 ml/m LA Vol (A4C):   39.4 ml 21.76 ml/m LA Biplane Vol: 44.1 ml 24.36 ml/m  AORTIC VALVE AV Area (Vmax):    3.62 cm AV Area (Vmean):   3.86 cm AV Area (VTI):     3.93 cm AV Vmax:           132.00 cm/s AV Vmean:          87.100 cm/s AV VTI:            0.195 m AV Peak Grad:      7.0 mmHg AV Mean Grad:      4.0 mmHg LVOT Vmax:         152.00 cm/s LVOT Vmean:        107.000 cm/s LVOT VTI:          0.244 m LVOT/AV VTI ratio: 1.25  AORTA Ao Root diam: 3.40 cm MITRAL VALVE                TRICUSPID VALVE MV Area (PHT): 5.34 cm     TR Peak grad:   34.1 mmHg MV Decel Time: 142 msec     TR Vmax:        292.00 cm/s MV E velocity: 112.00 cm/s                             SHUNTS                             Systemic VTI:  0.24 m                             Systemic Diam: 2.00 cm Lanell Pinta Custovic Electronically signed by Isabell Manzanilla Signature Date/Time: 12/08/2023/12:14:23 PM    Final    DG Chest 1 View Result Date: 12/08/2023 CLINICAL DATA:   89 year old female with CHF. EXAM: CHEST  1 VIEW COMPARISON:  Portable chest yesterday and earlier. FINDINGS: Portable AP semi upright view at 0650 hours. Stable to mildly improved lung volumes. Stable cardiac size and mediastinal contours. Calcified aortic atherosclerosis. Layering pleural effusions have not significantly changed, small to moderate on the left and small on the right. No superimposed pneumothorax, consolidation. No overt edema, and pulmonary vascularity appears regressed. Stable visualized osseous structures.  Paucity of bowel gas. IMPRESSION: Regressed/resolved pulmonary vascular congestion since yesterday with stable small to moderate pleural effusions. Electronically Signed   By: Marlise Simpers M.D.   On: 12/08/2023 07:03   DG Chest Port 1 View Result Date: 12/07/2023 CLINICAL DATA:  Questionable sepsis - evaluate for abnormality EXAM: PORTABLE CHEST 1 VIEW COMPARISON:  09/17/2021. FINDINGS: There  are diffuse mild-to-moderately increased interstitial markings. There is left retrocardiac airspace opacity obscuring the left hemidiaphragm, descending thoracic aorta and blunting the left lateral costophrenic angle, suggesting combination of left lung atelectasis and/or consolidation with pleural effusion. Bilateral lungs are otherwise clear. There is blunting of right lateral costophrenic angle suggesting small right pleural effusion. Stable cardio-mediastinal silhouette. No acute osseous abnormalities. The soft tissues are within normal limits. IMPRESSION: 1. Findings favor congestive heart failure/pulmonary edema. 2. There is left retrocardiac opacity, as described above. 3. Small bilateral pleural effusions. Electronically Signed   By: Beula Brunswick M.D.   On: 12/07/2023 13:35       Time spent: 35 min     Melodi Sprung, DO [facility] 12/10/2023, 3:58 PM    Dictation software may have been used to generate the above note. Typos may occur and escape review in typed/dictated  notes. Please contact Dr Authur Leghorn directly for clarity if needed.  Staff may message me via secure chat in Epic  but this may not receive an immediate response,  please page me for urgent matters!  If 7PM-7AM, please contact night coverage [email or web address]

## 2023-12-11 DIAGNOSIS — I509 Heart failure, unspecified: Secondary | ICD-10-CM

## 2023-12-11 DIAGNOSIS — Z7189 Other specified counseling: Secondary | ICD-10-CM | POA: Diagnosis not present

## 2023-12-11 DIAGNOSIS — Z66 Do not resuscitate: Secondary | ICD-10-CM

## 2023-12-11 DIAGNOSIS — N3001 Acute cystitis with hematuria: Secondary | ICD-10-CM

## 2023-12-11 DIAGNOSIS — I480 Paroxysmal atrial fibrillation: Secondary | ICD-10-CM

## 2023-12-11 DIAGNOSIS — Z9889 Other specified postprocedural states: Secondary | ICD-10-CM

## 2023-12-11 DIAGNOSIS — Z515 Encounter for palliative care: Secondary | ICD-10-CM | POA: Diagnosis not present

## 2023-12-11 DIAGNOSIS — I5033 Acute on chronic diastolic (congestive) heart failure: Secondary | ICD-10-CM | POA: Diagnosis not present

## 2023-12-11 DIAGNOSIS — J9601 Acute respiratory failure with hypoxia: Secondary | ICD-10-CM

## 2023-12-11 LAB — BASIC METABOLIC PANEL WITH GFR
Anion gap: 8 (ref 5–15)
BUN: 13 mg/dL (ref 8–23)
CO2: 34 mmol/L — ABNORMAL HIGH (ref 22–32)
Calcium: 9.1 mg/dL (ref 8.9–10.3)
Chloride: 93 mmol/L — ABNORMAL LOW (ref 98–111)
Creatinine, Ser: 0.65 mg/dL (ref 0.44–1.00)
GFR, Estimated: >60 mL/min (ref >60)
Glucose, Bld: 120 mg/dL — ABNORMAL HIGH (ref 70–99)
Potassium: 3.4 mmol/L — ABNORMAL LOW (ref 3.5–5.1)
Sodium: 135 mmol/L (ref 135–145)

## 2023-12-11 MED ORDER — POTASSIUM CHLORIDE CRYS ER 20 MEQ PO TBCR
40.0000 meq | EXTENDED_RELEASE_TABLET | ORAL | Status: AC
  Administered 2023-12-11 (×2): 40 meq via ORAL
  Filled 2023-12-11 (×2): qty 2

## 2023-12-11 MED ORDER — OXYCODONE HCL 5 MG PO TABS
5.0000 mg | ORAL_TABLET | Freq: Once | ORAL | Status: AC
  Administered 2023-12-11: 5 mg via ORAL
  Filled 2023-12-11: qty 1

## 2023-12-11 NOTE — Discharge Instructions (Signed)
**  Please remember to contact daughter's case manager regarding transportation assistance to her appointments.**

## 2023-12-11 NOTE — Progress Notes (Signed)
 [facility] CARDIOLOGY PROGRESS NOTE   [name] MRN: [id number] DOB/AGE: [birth year] 89 y.o.  Admit date: 12/07/2023 Referring Physician Dr. Melodi Sprung Primary Physician Monique Ano, MD  Primary Cardiologist Dr. Larinda Plover Reason for Consultation AF RVR, heart failure   HPI: Mary [name] is a 89 y.o. female  with a past medical history of coronary artery disease by CT scan, peripheral vascular disease s/p L CEApoorly controlled HTN, hyperlipidemia, type 2 diabetes, pulmonary hypertension who presented to the ED on 12/07/2023 for SOB and worsening edema. Found to be in AF RVR. Cardiology was consulted for further evaluation.    Interval History:  -Patient seen and examined at bedside, resting comfortably. No events overnight. Breathing and edema are much improved and patient ready for discharge likely tomorrow morning.  Review of systems complete and found to be negative unless listed above    Vitals:   12/11/23 0445 12/11/23 0447 12/11/23 0517 12/11/23 0820  BP: 132/72   (!) 144/78  Pulse: (!) 129 (!) 112  (!) 110  Resp: 16   18  Temp: 99.6 F (37.6 C)   99.4 F (37.4 C)  TempSrc:    Oral  SpO2: (!) 89% 95%  96%  Weight:   68.2 kg   Height:         Intake/Output Summary (Last 24 hours) at 12/11/2023 1223 Last data filed at 12/10/2023 1536 Gross per 24 hour  Intake 276.91 ml  Output --  Net 276.91 ml     PHYSICAL EXAM General: awake, well nourished, in no acute distress. HEENT: Normocephalic and atraumatic. Neck: No JVD.  Lungs: Normal respiratory effort. Clear bilaterally to auscultation. No wheezes, crackles, rhonchi.  Heart: HRRR. Normal S1 and S2 without gallops or murmurs. Radial & DP pulses 2+ bilaterally. Abdomen: Non-distended appearing.  Msk: Normal strength and tone for age. Extremities: No clubbing, cyanosis or edema.   Neuro: Alert and oriented X 3. Psych: Mood appropriate, affect congruent.    LABS: Basic Metabolic  Panel: Recent Labs    12/10/23 0843 12/11/23 0538  NA 136 135  K 3.7 3.4*  CL 93* 93*  CO2 35* 34*  GLUCOSE 121* 120*  BUN 16 13  CREATININE 0.66 0.65  CALCIUM  8.9 9.1  MG 1.7  --    Liver Function Tests: No results for input(s): "AST", "ALT", "ALKPHOS", "BILITOT", "PROT", "ALBUMIN" in the last 72 hours.  No results for input(s): "LIPASE", "AMYLASE" in the last 72 hours. CBC: No results for input(s): "WBC", "NEUTROABS", "HGB", "HCT", "MCV", "PLT" in the last 72 hours.  Cardiac Enzymes: No results for input(s): "CKTOTAL", "CKMB", "CKMBINDEX", "TROPONINIHS" in the last 72 hours.  BNP: No results for input(s): "BNP" in the last 72 hours.  D-Dimer: No results for input(s): "DDIMER" in the last 72 hours. Hemoglobin A1C: No results for input(s): "HGBA1C" in the last 72 hours. Fasting Lipid Panel: No results for input(s): "CHOL", "HDL", "LDLCALC", "TRIG", "CHOLHDL", "LDLDIRECT" in the last 72 hours. Thyroid Function Tests: No results for input(s): "TSH", "T4TOTAL", "T3FREE", "THYROIDAB" in the last 72 hours.  Invalid input(s): "FREET3"  Anemia Panel: No results for input(s): "VITAMINB12", "FOLATE", "FERRITIN", "TIBC", "IRON ", "RETICCTPCT" in the last 72 hours.  US  THORACENTESIS ASP PLEURAL SPACE W/IMG GUIDE Result Date: 12/09/2023 INDICATION: Acute hypoxic respiratory failure.  Request for a thoracentesis. EXAM: ULTRASOUND GUIDED LEFT THORACENTESIS MEDICATIONS: None. COMPLICATIONS: None immediate. PROCEDURE: An ultrasound guided thoracentesis was thoroughly discussed with the patient and questions answered. The benefits, risks, alternatives and complications  were also discussed. The patient understands and wishes to proceed with the procedure. Written consent was obtained. Ultrasound was performed to localize and mark an adequate pocket of fluid in the left chest. The area was then prepped and draped in the normal sterile fashion. 1% Lidocaine  was used for local anesthesia. Under  ultrasound guidance a 6 Fr Safe-T-Centesis catheter was introduced. Thoracentesis was performed. The catheter was removed and a dressing applied. FINDINGS: Ultrasound demonstrated small bilateral pleural effusions. Left pleural effusion was slightly larger. A total of approximately 150 mL of yellow fluid was removed. Samples were sent to the laboratory as requested by the clinical team. IMPRESSION: Successful ultrasound guided left thoracentesis yielding 150 mL of pleural fluid. Electronically Signed   By: Elene Griffes M.D.   On: 12/09/2023 16:37   DG Chest Port 1 View Result Date: 12/09/2023 CLINICAL DATA:  142230 Pleural effusion 142230 [id number] Status post thoracentesis [id number] EXAM: PORTABLE CHEST 1 VIEW COMPARISON:  12/08/2023. FINDINGS: There is small left pleural effusion, decreased since the prior study, status post thoracentesis. No pneumothorax. There is trace right pleural effusion, which is similar to the prior study. Bilateral lung fields are otherwise clear. Stable cardio-mediastinal silhouette. No acute osseous abnormalities. The soft tissues are within normal limits. IMPRESSION: Small left pleural effusion, decreased since the prior study, status post thoracentesis. No pneumothorax. Electronically Signed   By: Beula Brunswick M.D.   On: 12/09/2023 15:45     ECHO pending   TELEMETRY reviewed by me 12/08/2023: atrial fibrillation rate 90-100s   EKG reviewed by me: AF RVR rate 112 bpm   Data reviewed by me 12/08/2023: last 24h vitals tele labs imaging I/O ED provider note, admission H&P   ASSESSMENT AND PLAN:  Principal Problem:   CHF (congestive heart failure) (HCC) Active Problems:   Acute respiratory failure with hypoxia (HCC)   Acute on chronic diastolic CHF (congestive heart failure) (HCC)   A-fib (HCC)   Mary [name] is a 89 y.o. female  with a past medical history of coronary artery disease by CT scan, peripheral vascular disease s/p L CEA poorly controlled HTN, hyperlipidemia,  type 2 diabetes, pulmonary hypertension who presented to the ED on 12/07/2023 for SOB and worsening edema. Found to be in AF RVR. Cardiology was consulted for further evaluation.    # Atrial fibrillation RVR # Presumed heart failure preserved EF Patient with worsening shortness of breath and lower extremity edema with elevated BNP of 520.  Chest x-ray consistent with pulmonary edema, improved today.  Also in atrial fibrillation RVR on initial EKG, right now improved. -Continue IV Lasix  40 mg twice daily. Ok to switch to PO. Patient stable for d/c from cardiac standpoint. -Coreg  increased by primary team - rates are much better controlled at this time.  Will defer additional digoxin . -Continue irbesartan  300 mg daily, hydralazine  50 mg 3 times daily, Coreg  as above. -Continue aspirin  81 mg daily, Crestor  5 mg daily. -Patient has history of severe nose bleeds and DAPT has been held for 2 months. CHA?DS?-VASc score is 7 with age, gender, Hx of CHF, PVD, and T2DM would recommend long-term anticoagulation.  ASA discontinued. Continue Eliquis , monitor for bleeding    Isabell Manzanilla, DO 12/11/2023, 12:23 PM [facility] Cardiology

## 2023-12-11 NOTE — Progress Notes (Signed)
 Physical Therapy Treatment Patient Details Name: Mary [name] MRN: [id number] DOB: May 11, 1935 Today's Date: 12/11/2023   History of Present Illness Pt is an 89 y/o F admitted on 12/07/23 after presenting with worsening cough, SOB & peripheral edema. Pt found to have B pleural effusion, elevated HR, & a-fib with RVR. Pt is being treated for acute on chronic HFpEF & a-fib with RVR. PMH: HTN, chronic HFpEF, CAD, hypothyroidism, recurrent nosebleed, question of PAF, asthma, c diff infection, lumbar DDD, depression, DM2, diabetic retinopathy, heart murmur, HLD, RA, sleep apnea, spinal stenosis    PT Comments  Struggles with bed mobility with rails and HOB raised with cga.  She is generally steady in sitting once upright but when she sits back on bed after gait falls backwards onto bed.  She stands with cga/min a x 1.  Walks just outside of doorway and opts to sit in chair to rest before walking back into her room.  O2 at 2 lpm for gait. Stated she does not use O2 at baseline at home.    Discussed at length discharge plan.  She continues to struggle with household distances, balance and functional mobility.  She continues to want to discharge home as her daughter is home and she does not feel she is safe to be left alone at night much longer.  Has an aide but is alone at night and long stretches during the day.  She wants to be home to help her daughter.  Discussed risks/benefits as she is struggling with her basic ADL tasks.  She stated she has a walker and ramp at home for [facility] level daughter.  Discussed option on wheelchair for her at home to allow for household mobility and access in/out of home since she is unable to walk distances at this time.  While not ideal, if she continues to decline SNF it is a safer option for her to be WC level at this time. A BSC and HHPT/OT is recommended if she continues to decline SNF.   She voiced understanding and agreement if needed.  Patient suffers from general weakness  which impairs his/her ability to perform daily activities like toileting, feeding, dressing, grooming, bathing in the home. A cane, walker, crutch will not resolve the patient's issue with performing activities of daily living. A lightweight wheelchair and cushion is required/recommended and will allow patient to safely perform daily activities.   Patient can safely propel the wheelchair in the home or has a caregiver who can provide assistance.   Per pt, daughter has some PCA assist but does seem that she would benefit from some increased support services.  Pt does not have internet/smart phone to help navigate services.  Pt given some resources/phone numbers to look into for her daughter.  PACE program and possibly [facility] but second has long wait list for services and will likely be challenging for pt/daughter to navigate on their own.  Pt's primary reason for not wanting SNF is lack of care and family support for her daughter putting herself at increased risk for falls/readmission.      If plan is discharge home, recommend the following: A little help with walking and/or transfers;A little help with bathing/dressing/bathroom;Assistance with cooking/housework;Assist for transportation;Help with stairs or ramp for entrance   Can travel by private vehicle        Equipment Recommendations  BSC/3in1;Wheelchair (measurements PT);Wheelchair cushion (measurements PT)    Recommendations for Other Services       Precautions / Restrictions Precautions Precautions: Fall  Restrictions Weight Bearing Restrictions Per Provider Order: No     Mobility  Bed Mobility Overal bed mobility: Needs Assistance Bed Mobility: Supine to Sit, Sit to Supine     Supine to sit: Used rails, Contact guard, HOB elevated Sit to supine: Contact guard assist, Used rails, HOB elevated   General bed mobility comments: struggles with rails and HOB elevated Patient Response: Cooperative  Transfers Overall transfer  level: Needs assistance Equipment used: Rolling walker (2 wheels) Transfers: Sit to/from Stand Sit to Stand: Contact guard assist, Min assist           General transfer comment: struggles to stand without assist    Ambulation/Gait Ambulation/Gait assistance: Contact guard assist Gait Distance (Feet): 20 Feet Assistive device: Rolling walker (2 wheels)   Gait velocity: decreased     General Gait Details: 20' x 2 with slow gait, fatigues quickly  HR and O2 stable today   Stairs             Wheelchair Mobility     Tilt Bed Tilt Bed Patient Response: Cooperative  Modified Rankin (Stroke Patients Only)       Balance Overall balance assessment: Needs assistance Sitting-balance support: Feet supported Sitting balance-Leahy Scale: Good     Standing balance support: Bilateral upper extremity supported, During functional activity, Reliant on assistive device for balance Standing balance-Leahy Scale: Fair Standing balance comment: relaint on RW however no LOB observed                            Communication Communication Communication: No apparent difficulties  Cognition Arousal: Alert Behavior During Therapy: WFL for tasks assessed/performed   PT - Cognitive impairments: No apparent impairments                         Following commands: Intact      Cueing Cueing Techniques: Verbal cues  Exercises      General Comments        Pertinent Vitals/Pain Pain Assessment Pain Assessment: Faces Faces Pain Scale: Hurts a little bit Pain Location: general discomfort Pain Descriptors / Indicators: Sore Pain Intervention(s): Limited activity within patient's tolerance, Monitored during session, Repositioned    Home Living                          Prior Function            PT Goals (current goals can now be found in the care plan section) Progress towards PT goals: Progressing toward goals    Frequency    Min  3X/week      PT Plan      Co-evaluation              AM-PAC PT "6 Clicks" Mobility   Outcome Measure  Help needed turning from your back to your side while in a flat bed without using bedrails?: None Help needed moving from lying on your back to sitting on the side of a flat bed without using bedrails?: A Little Help needed moving to and from a bed to a chair (including a wheelchair)?: A Little Help needed standing up from a chair using your arms (e.g., wheelchair or bedside chair)?: A Little Help needed to walk in hospital room?: A Little Help needed climbing 3-5 steps with a railing? : A Little 6 Click Score: 19    End of Session Equipment Utilized During  Treatment: Gait belt;Oxygen Activity Tolerance: Patient limited by fatigue Patient left: in bed;with bed alarm set;with call bell/phone within reach Nurse Communication: Mobility status PT Visit Diagnosis: Muscle weakness (generalized) (M62.81);Unsteadiness on feet (R26.81)     Time: 1610-9604 PT Time Calculation (min) (ACUTE ONLY): 22 min  Charges:    $Gait Training: 8-22 mins PT General Charges $$ ACUTE PT VISIT: 1 Visit                   Charlanne Cong, PTA 12/11/23, 3:59 PM

## 2023-12-11 NOTE — Progress Notes (Signed)
   CROSS COVER NOTE  Patient name: AYLEE LITTRELL MRN: [id number] DOB : 1934/11/30  Concern as stated by nurse / staff   HPI: Eeva B Delvecchio is a 89 y.o. female with medical history significant of HTN, chronic HFpEF, CAD, hypothyroidism, recurrent nosebleed off dual antiplatelet therapy, question of PAF, presented from home with worsening of cough shortness of breath and peripheral edema. Onset 7 days prior. Came to ED when noting SOB at rest. c/o right arm pain, last given Tylenol  @ 1700 with little to no relief/effect, no c/o chest pain    Review of Pertinent findings: Vitals stable, weaned to room air. Has already received PRN tylenol  and muscle relaxer without relief.   Assessment and  Interventions   Assessment:uncontrolled pain of R arm.   Plan: One time dose of 5mg  oxycodone  ordered

## 2023-12-11 NOTE — Progress Notes (Signed)
 PROGRESS NOTE    Mary [name]   WUJ:[id number] DOB: 1935-08-10  DOA: 12/07/2023 Date of Service: 12/11/23 which is hospital day 4  PCP: Monique Ano, MD    Hospital course / significant events:   HPI:  Mary [name] is a 89 y.o. female with medical history significant of HTN, chronic HFpEF, CAD, hypothyroidism, recurrent nosebleed off dual antiplatelet therapy, question of PAF, presented from home with worsening of cough shortness of breath and peripheral edema. Onset 7 days prior. Came to ED when noting SOB at rest.    04/16: to ED. CXR pulmonary congestion and bilateral pleural effusion, elevated HR and Afib RVR on EKG. Admitted to hospitalist service for acute on chronic HFpEF and Afib RVR. IV lasix , digoxin  loading IV also ordered 04/17: HR remains labile, occasionally sustaining 130s-140s. Increase beta blocker. Echo results pending. Cardiology consulted re: continuation digoxin  vs other, HR responding to beta blocker so will not continue digoxin  04/18: thoracentesis removed 150 mL. HR still up a bit, monitoring  04/19: transition to po lasix , coreg  increased again today and HR is improved. Restarted Eliquis , dc ASA per cardiology. 04/20: improving on increased beta blocker, cardiology states ok to dc from their standpoint, likely for dc tomorrow once we can arrange DME/HH      Consultants:  Cardiology  Palliative care   Procedures/Surgeries: 12/09/23 L thoracentesis      ASSESSMENT & PLAN:   Acute hypoxic respiratory failure Due to pulmonary edema and pleural effusion from CHF O2 supplement, wean as able Treat underlying cause(s)  L Pleural effusion - noted large on Echo, moderate on CXR Decreased lung sounds L lung base likely effusion Thoracentesis 04/18 removed 150 mL L chest   Acute on chronic HFpEF decompensation Essential HTN Question secondary to A-fib with RVR or may be primary/initial problem Echocardiogram --> EF 60-65%, no RWMA, indeterminate  diastolic parameters but suspect some diastolic dysfunction given clinical CHF  Treat Afib as noted IV Lasix  --> po  Coreg  12.5 mg twice daily --> 25 mg bid today  Irbesartan  300 mg daily Hydralazine  50 mg 3 times daily Aspirin  81 mg daily d/c and restart Eliquis  5 mg bid per cardiology  Crestor  5 mg daily. Strict I&O's   PAF with RVR RVR resolved  Question secondary to CHF or may be primary/initial problem Coreg  12.5 mg twice daily --> 25 mg bid today  defer additional digoxin . Follow potassium, magnesium  and phosphorus levels and correct electrolytes as needed CHADS2VASC = 7, indication for systemic anticoagulation.  However patient reported that she has had repeated severe nosebleed recently and antiplatelet therapy of dual antiplatelet aspirin  Plavix  have been held for 2 months. Aspirin  81 mg daily d/c and restart Eliquis  5 mg bid per cardiology    SIRS assoc w/ acute resp fail, Afib, CHF Treat as above No leukocytosis/infection, no sepsis   Abnormal UA  Asymptomatic bacteriuria  UCx no growth VS more c/w SIRS/Afib/CHF rather than sepsis, denies urinary symptoms, will not treat Follow UCx --> no growth   Hypokalemia Replace as needed - ordered 40 mg yesterday for breakfast and lunch yesterday, she only got lunch dose yesterday and then breakfast dose this AM... Monitor BMP  Hypothyroidism TSH WNL, not likely to be contributing to cardiac issues as above  Continue Synthroid    Anxiety/depression Continue Prozac and trazodone  at bedtime   IIDM A1c 6.1 Continue diet control   Deconditioning PT evaluation - pt very weak and likely inadequate reserve, she is declining SNF rehab, reassess tomorrow  overweight based on BMI: Body mass index is 25.55 kg/m.  Underweight - under 18  overweight - 25 to 29 obese - 30 or more Class 1 obesity: BMI of 30.0 to 34 Class 2 obesity: BMI of 35.0 to 39 Class 3 obesity: BMI of 40.0 to 49 Super Morbid Obesity: BMI  50-59 Super-super Morbid Obesity: BMI 60+ Significantly low or high BMI is associated with higher medical risk.  Weight management advised as adjunct to other disease management and risk reduction treatments    DVT prophylaxis: Eliquis   IV fluids: no continuous IV fluids  Nutrition: cardiac diet Central lines / other devices: none  Code Status: DNR ACP documentation reviewed:  none on file in [facility]  TOC needs: DME/HH, pt decliend SNF rehab Medical barriers to dispo: none if HR ok in AM. Expected medical readiness for discharge 04/21             Subjective / Brief ROS:  Patient reports breathing and edema are better Denies CP/SOB at rest No palpations  Pain controlled.  Denies new weakness.  Tolerating diet.  Reports no concerns w/ urination/defecation.   Family Communication: none at this time     Objective Findings:  Vitals:   12/11/23 1145 12/11/23 1550 12/11/23 1656 12/11/23 1704  BP: 136/80 (!) 145/79 131/68   Pulse: 100  (!) 103   Resp: 18   18  Temp: 98.8 F (37.1 C)  98.8 F (37.1 C)   TempSrc: Oral  Oral   SpO2: 97%  92%   Weight:      Height:       No intake or output data in the 24 hours ending 12/11/23 1725  Filed Weights   12/09/23 0532 12/10/23 0500 12/11/23 0517  Weight: 68.5 kg 70.4 kg 68.2 kg    Examination:  Physical Exam Constitutional:      General: She is not in acute distress. Cardiovascular:     Rate and Rhythm: Normal rate. Rhythm irregular.  Pulmonary:     Effort: Pulmonary effort is normal.     Breath sounds: No decreased breath sounds or rales (improved).  Musculoskeletal:     Right lower leg: Edema (trace/improved) present.     Left lower leg: Edema (trace/improved) present.     Comments: Pt reports LE edema has improved compared to yesterday   Neurological:     General: No focal deficit present.     Mental Status: She is alert and oriented to person, place, and time.  Psychiatric:        Mood and Affect: Mood  normal.        Behavior: Behavior normal.          Scheduled Medications:   apixaban   5 mg Oral BID   busPIRone   10 mg Oral BID   carvedilol   25 mg Oral BID WC   Fe Fum-Vit C-Vit B12-FA  1 capsule Oral Daily   furosemide   40 mg Oral BID   gabapentin   200 mg Oral QHS   hydrALAZINE   50 mg Oral TID   irbesartan   300 mg Oral Daily   ketotifen   1 drop Both Eyes BID   levothyroxine   75 mcg Oral Daily   mirabegron  ER  25 mg Oral Daily   pantoprazole   20 mg Oral Daily   PARoxetine   40 mg Oral QPM   rosuvastatin   5 mg Oral Daily   sodium chloride  flush  3 mL Intravenous Q12H    Continuous Infusions:    PRN  Medications:  acetaminophen , cyclobenzaprine , ondansetron  (ZOFRAN ) IV, sodium chloride  flush, traZODone   Antimicrobials from admission:  Anti-infectives (From admission, onward)    Start     Dose/Rate Route Frequency Ordered Stop   12/07/23 1300  levofloxacin  (LEVAQUIN ) IVPB 750 mg        750 mg 100 mL/hr over 90 Minutes Intravenous  Once 12/07/23 1254 12/07/23 1459           Data Reviewed:  I have personally reviewed the following...  CBC: Recent Labs  Lab 12/07/23 1242  WBC 6.1  NEUTROABS 4.3  HGB 13.5  HCT 42.9  MCV 100.0  PLT 189   Basic Metabolic Panel: Recent Labs  Lab 12/07/23 1242 12/07/23 1502 12/08/23 0522 12/09/23 0510 12/10/23 0843 12/11/23 0538  NA 136  --  138 138 136 135  K 4.0  --  3.3* 2.8* 3.7 3.4*  CL 103  --  96* 94* 93* 93*  CO2 23  --  36* 35* 35* 34*  GLUCOSE 170*  --  102* 118* 121* 120*  BUN 12  --  10 14 16 13   CREATININE 0.75  --  0.72 0.80 0.66 0.65  CALCIUM  8.8*  --  8.5* 8.8* 8.9 9.1  MG  --  1.9  --   --  1.7  --   PHOS  --  2.5  --   --   --   --    GFR: Estimated Creatinine Clearance: 47.3 mL/min (by C-G formula based on SCr of 0.65 mg/dL). Liver Function Tests: Recent Labs  Lab 12/07/23 1242  AST 30  ALT 11  ALKPHOS 58  BILITOT 0.6  PROT 6.8  ALBUMIN 3.5   No results for input(s): "LIPASE",  "AMYLASE" in the last 168 hours. No results for input(s): "AMMONIA" in the last 168 hours. Coagulation Profile: Recent Labs  Lab 12/07/23 1309  INR 1.1   Cardiac Enzymes: No results for input(s): "CKTOTAL", "CKMB", "CKMBINDEX", "TROPONINI" in the last 168 hours. BNP (last 3 results) No results for input(s): "PROBNP" in the last 8760 hours. HbA1C: No results for input(s): "HGBA1C" in the last 72 hours. CBG: Recent Labs  Lab 12/08/23 1208  GLUCAP 117*   Lipid Profile: No results for input(s): "CHOL", "HDL", "LDLCALC", "TRIG", "CHOLHDL", "LDLDIRECT" in the last 72 hours. Thyroid Function Tests: No results for input(s): "TSH", "T4TOTAL", "FREET4", "T3FREE", "THYROIDAB" in the last 72 hours.  Anemia Panel: No results for input(s): "VITAMINB12", "FOLATE", "FERRITIN", "TIBC", "IRON ", "RETICCTPCT" in the last 72 hours. Most Recent Urinalysis On File:     Component Value Date/Time   COLORURINE YELLOW (A) 12/07/2023 1309   APPEARANCEUR HAZY (A) 12/07/2023 1309   APPEARANCEUR Hazy (A) 05/09/2023 1300   LABSPEC 1.005 12/07/2023 1309   LABSPEC 1.005 12/29/2013 0119   PHURINE 6.0 12/07/2023 1309   GLUCOSEU NEGATIVE 12/07/2023 1309   GLUCOSEU Negative 12/29/2013 0119   HGBUR SMALL (A) 12/07/2023 1309   BILIRUBINUR NEGATIVE 12/07/2023 1309   BILIRUBINUR Negative 05/09/2023 1300   BILIRUBINUR Negative 12/29/2013 0119   KETONESUR NEGATIVE 12/07/2023 1309   PROTEINUR NEGATIVE 12/07/2023 1309   NITRITE NEGATIVE 12/07/2023 1309   LEUKOCYTESUR LARGE (A) 12/07/2023 1309   LEUKOCYTESUR 1+ 12/29/2013 0119   Sepsis Labs: @LABRCNTIP (procalcitonin:4,lacticidven:4) Microbiology: Recent Results (from the past 240 hours)  Resp panel by RT-PCR (RSV, Flu A&B, Covid) Anterior Nasal Swab     Status: None   Collection Time: 12/07/23  1:09 PM   Specimen: Anterior Nasal Swab  Result Value Ref Range Status  SARS Coronavirus 2 by RT PCR NEGATIVE NEGATIVE Final    Comment: (NOTE) SARS-CoV-2 target  nucleic acids are NOT DETECTED.  The SARS-CoV-2 RNA is generally detectable in upper respiratory specimens during the acute phase of infection. The lowest concentration of SARS-CoV-2 viral copies this assay can detect is 138 copies/mL. A negative result does not preclude SARS-Cov-2 infection and should not be used as the sole basis for treatment or other patient management decisions. A negative result may occur with  improper specimen collection/handling, submission of specimen other than nasopharyngeal swab, presence of viral mutation(s) within the areas targeted by this assay, and inadequate number of viral copies(<138 copies/mL). A negative result must be combined with clinical observations, patient history, and epidemiological information. The expected result is Negative.  Fact Sheet for Patients:  BloggerCourse.com  Fact Sheet for Healthcare Providers:  SeriousBroker.it  This test is no t yet approved or cleared by the [place]  FDA and  has been authorized for detection and/or diagnosis of SARS-CoV-2 by FDA under an Emergency Use Authorization (EUA). This EUA will remain  in effect (meaning this test can be used) for the duration of the COVID-19 declaration under Section 564(b)(1) of the Act, 21 U.S.C.section 360bbb-3(b)(1), unless the authorization is terminated  or revoked sooner.       Influenza A by PCR NEGATIVE NEGATIVE Final   Influenza B by PCR NEGATIVE NEGATIVE Final    Comment: (NOTE) The Xpert Xpress SARS-CoV-2/FLU/RSV plus assay is intended as an aid in the diagnosis of influenza from Nasopharyngeal swab specimens and should not be used as a sole basis for treatment. Nasal washings and aspirates are unacceptable for Xpert Xpress SARS-CoV-2/FLU/RSV testing.  Fact Sheet for Patients: BloggerCourse.com  Fact Sheet for Healthcare  Providers: SeriousBroker.it  This test is not yet approved or cleared by the [place]  FDA and has been authorized for detection and/or diagnosis of SARS-CoV-2 by FDA under an Emergency Use Authorization (EUA). This EUA will remain in effect (meaning this test can be used) for the duration of the COVID-19 declaration under Section 564(b)(1) of the Act, 21 U.S.C. section 360bbb-3(b)(1), unless the authorization is terminated or revoked.     Resp Syncytial Virus by PCR NEGATIVE NEGATIVE Final    Comment: (NOTE) Fact Sheet for Patients: BloggerCourse.com  Fact Sheet for Healthcare Providers: SeriousBroker.it  This test is not yet approved or cleared by the [place]  FDA and has been authorized for detection and/or diagnosis of SARS-CoV-2 by FDA under an Emergency Use Authorization (EUA). This EUA will remain in effect (meaning this test can be used) for the duration of the COVID-19 declaration under Section 564(b)(1) of the Act, 21 U.S.C. section 360bbb-3(b)(1), unless the authorization is terminated or revoked.  Performed at [facility], [address] [place], [place] [address]   Blood Culture (routine x 2)     Status: None (Preliminary result)   Collection Time: 12/07/23  1:09 PM   Specimen: BLOOD  Result Value Ref Range Status   Specimen Description   Final    BLOOD Blood Culture results may not be optimal due to an inadequate volume of blood received in culture bottles   Special Requests   Final    BOTTLES DRAWN AEROBIC AND ANAEROBIC RIGHT ANTECUBITAL   Culture   Final    NO GROWTH 4 DAYS Performed at [facility], [address] [place], [place] [address]    Report Status PENDING  Incomplete  Blood Culture (routine x 2)  Status: None (Preliminary result)   Collection Time: 12/07/23  1:09 PM   Specimen: BLOOD  Result Value Ref Range Status   Specimen  Description   Final    BLOOD Blood Culture results may not be optimal due to an inadequate volume of blood received in culture bottles   Special Requests   Final    BOTTLES DRAWN AEROBIC AND ANAEROBIC LEFT ANTECUBITAL   Culture   Final    NO GROWTH 4 DAYS Performed at [facility], [address] [place], [place] [address]    Report Status PENDING  Incomplete  Urine Culture     Status: None   Collection Time: 12/07/23  1:09 PM   Specimen: Urine, Clean Catch  Result Value Ref Range Status   Specimen Description   Final    URINE, CLEAN CATCH Performed at [facility], [address] [place], [place] [address]    Special Requests   Final    NONE Reflexed from [id number] Performed at [facility], [address] [place], [place] [address]    Culture   Final    NO GROWTH Performed at [facility], [address], [place], [place] [address]    Report Status 12/08/2023 FINAL  Final      Radiology Studies last 3 days: US  THORACENTESIS ASP PLEURAL SPACE W/IMG GUIDE Result Date: 12/09/2023 INDICATION: Acute hypoxic respiratory failure.  Request for a thoracentesis. EXAM: ULTRASOUND GUIDED LEFT THORACENTESIS MEDICATIONS: None. COMPLICATIONS: None immediate. PROCEDURE: An ultrasound guided thoracentesis was thoroughly discussed with the patient and questions answered. The benefits, risks, alternatives and complications were also discussed. The patient understands and wishes to proceed with the procedure. Written consent was obtained. Ultrasound was performed to localize and mark an adequate pocket of fluid in the left chest. The area was then prepped and draped in the normal sterile fashion. 1% Lidocaine  was used for local anesthesia. Under ultrasound guidance a 6 Fr Safe-T-Centesis catheter was introduced. Thoracentesis was performed. The catheter was removed and a dressing applied. FINDINGS: Ultrasound demonstrated small bilateral pleural effusions. Left pleural  effusion was slightly larger. A total of approximately 150 mL of yellow fluid was removed. Samples were sent to the laboratory as requested by the clinical team. IMPRESSION: Successful ultrasound guided left thoracentesis yielding 150 mL of pleural fluid. Electronically Signed   By: Elene Griffes M.D.   On: 12/09/2023 16:37   DG Chest Port 1 View Result Date: 12/09/2023 CLINICAL DATA:  142230 Pleural effusion 142230 [id number] Status post thoracentesis [id number] EXAM: PORTABLE CHEST 1 VIEW COMPARISON:  12/08/2023. FINDINGS: There is small left pleural effusion, decreased since the prior study, status post thoracentesis. No pneumothorax. There is trace right pleural effusion, which is similar to the prior study. Bilateral lung fields are otherwise clear. Stable cardio-mediastinal silhouette. No acute osseous abnormalities. The soft tissues are within normal limits. IMPRESSION: Small left pleural effusion, decreased since the prior study, status post thoracentesis. No pneumothorax. Electronically Signed   By: Beula Brunswick M.D.   On: 12/09/2023 15:45   ECHOCARDIOGRAM COMPLETE Result Date: 12/08/2023    ECHOCARDIOGRAM REPORT   Patient Name:   Keonna B Mooneyham Date of Exam: 12/08/2023 Medical Rec #:  [id number]     Height:       66.0 in Accession #:    [id number]    Weight:       158.3 lb Date of Birth:  Aug 24, 1934     BSA:  1.811 m Patient Age:    88 years      BP:           141/74 mmHg Patient Gender: F             HR:           106 bpm. Exam Location:  [facility] Procedure: 2D Echo, Cardiac Doppler and Color Doppler (Both Spectral and Color            Flow Doppler were utilized during procedure). Indications:     CHF--acute diastolic I50.31  History:         Patient has prior history of Echocardiogram examinations, most                  recent 09/18/2021. CAD, Signs/Symptoms:Murmur; Risk                  Factors:Hypertension.  Sonographer:     Broadus Canes Referring Phys:  [id number] [place] Diagnosing Phys: Lanell Pinta Custovic  IMPRESSIONS  1. Left ventricular ejection fraction, by estimation, is 60 to 65%. The left ventricle has normal function. The left ventricle has no regional wall motion abnormalities. Left ventricular diastolic parameters are indeterminate.  2. Right ventricular systolic function is normal. The right ventricular size is normal.  3. Left atrial size was mildly dilated.  4. Large pleural effusion.  5. The mitral valve is normal in structure. Trivial mitral valve regurgitation. No evidence of mitral stenosis.  6. The aortic valve is normal in structure. Aortic valve regurgitation is not visualized. No aortic stenosis is present.  7. The inferior vena cava is normal in size with greater than 50% respiratory variability, suggesting right atrial pressure of 3 mmHg. FINDINGS  Left Ventricle: Left ventricular ejection fraction, by estimation, is 60 to 65%. The left ventricle has normal function. The left ventricle has no regional wall motion abnormalities. The left ventricular internal cavity size was normal in size. There is  no left ventricular hypertrophy. Left ventricular diastolic parameters are indeterminate. Right Ventricle: The right ventricular size is normal. No increase in right ventricular wall thickness. Right ventricular systolic function is normal. Left Atrium: Left atrial size was mildly dilated. Right Atrium: Right atrial size was normal in size. Pericardium: There is no evidence of pericardial effusion. Mitral Valve: The mitral valve is normal in structure. Trivial mitral valve regurgitation. No evidence of mitral valve stenosis. Tricuspid Valve: The tricuspid valve is normal in structure. Tricuspid valve regurgitation is mild. Aortic Valve: The aortic valve is normal in structure. Aortic valve regurgitation is not visualized. No aortic stenosis is present. Aortic valve mean gradient measures 4.0 mmHg. Aortic valve peak gradient measures 7.0 mmHg. Aortic valve area, by VTI measures 3.93 cm. Pulmonic Valve:  The pulmonic valve was normal in structure. Pulmonic valve regurgitation is not visualized. Aorta: The aortic root is normal in size and structure. Venous: The inferior vena cava is normal in size with greater than 50% respiratory variability, suggesting right atrial pressure of 3 mmHg. IAS/Shunts: No atrial level shunt detected by color flow Doppler. Additional Comments: There is a large pleural effusion.  LEFT VENTRICLE PLAX 2D LVIDd:         3.70 cm LVIDs:         2.30 cm LV PW:         0.80 cm LV IVS:        1.60 cm LVOT diam:     2.00 cm LV SV:  77 LV SV Index:   42 LVOT Area:     3.14 cm  RIGHT VENTRICLE RV Basal diam:  3.60 cm RV Mid diam:    3.20 cm LEFT ATRIUM             Index        RIGHT ATRIUM           Index LA diam:        4.40 cm 2.43 cm/m   RA Area:     14.40 cm LA Vol (A2C):   41.4 ml 22.87 ml/m  RA Volume:   31.70 ml  17.51 ml/m LA Vol (A4C):   39.4 ml 21.76 ml/m LA Biplane Vol: 44.1 ml 24.36 ml/m  AORTIC VALVE AV Area (Vmax):    3.62 cm AV Area (Vmean):   3.86 cm AV Area (VTI):     3.93 cm AV Vmax:           132.00 cm/s AV Vmean:          87.100 cm/s AV VTI:            0.195 m AV Peak Grad:      7.0 mmHg AV Mean Grad:      4.0 mmHg LVOT Vmax:         152.00 cm/s LVOT Vmean:        107.000 cm/s LVOT VTI:          0.244 m LVOT/AV VTI ratio: 1.25  AORTA Ao Root diam: 3.40 cm MITRAL VALVE                TRICUSPID VALVE MV Area (PHT): 5.34 cm     TR Peak grad:   34.1 mmHg MV Decel Time: 142 msec     TR Vmax:        292.00 cm/s MV E velocity: 112.00 cm/s                             SHUNTS                             Systemic VTI:  0.24 m                             Systemic Diam: 2.00 cm Lanell Pinta Custovic Electronically signed by Isabell Manzanilla Signature Date/Time: 12/08/2023/12:14:23 PM    Final    DG Chest 1 View Result Date: 12/08/2023 CLINICAL DATA:  89 year old female with CHF. EXAM: CHEST  1 VIEW COMPARISON:  Portable chest yesterday and earlier. FINDINGS: Portable AP semi  upright view at 0650 hours. Stable to mildly improved lung volumes. Stable cardiac size and mediastinal contours. Calcified aortic atherosclerosis. Layering pleural effusions have not significantly changed, small to moderate on the left and small on the right. No superimposed pneumothorax, consolidation. No overt edema, and pulmonary vascularity appears regressed. Stable visualized osseous structures.  Paucity of bowel gas. IMPRESSION: Regressed/resolved pulmonary vascular congestion since yesterday with stable small to moderate pleural effusions. Electronically Signed   By: Marlise Simpers M.D.   On: 12/08/2023 07:03       Time spent: 35 min     Jacion Dismore, DO [facility] 12/11/2023, 5:25 PM    Dictation software may have been used to generate the above note. Typos may occur and escape review in typed/dictated notes. Please contact Dr Authur Leghorn  directly for clarity if needed.  Staff may message me via secure chat in Epic  but this may not receive an immediate response,  please page me for urgent matters!  If 7PM-7AM, please contact night coverage [email or web address]
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
## 2023-12-11 NOTE — Plan of Care (Signed)
  Problem: Education: Goal: Knowledge of General Education information will improve Description: Including pain rating scale, medication(s)/side effects and non-pharmacologic comfort measures Outcome: Progressing   Problem: Clinical Measurements: Goal: Ability to maintain clinical measurements within normal limits will improve Outcome: Progressing   Problem: Clinical Measurements: Goal: Will remain free from infection Outcome: Progressing   Problem: Clinical Measurements: Goal: Diagnostic test results will improve Outcome: Progressing   Problem: Clinical Measurements: Goal: Respiratory complications will improve Outcome: Progressing   Problem: Clinical Measurements: Goal: Cardiovascular complication will be avoided Outcome: Progressing   Problem: Nutrition: Goal: Adequate nutrition will be maintained Outcome: Progressing   Problem: Safety: Goal: Ability to remain free from injury will improve Outcome: Progressing   Problem: Pain Managment: Goal: General experience of comfort will improve and/or be controlled Outcome: Progressing   Problem: Skin Integrity: Goal: Risk for impaired skin integrity will decrease Outcome: Progressing    Plan of care ongoing see MAR see flowsheet

## 2023-12-12 DIAGNOSIS — Z515 Encounter for palliative care: Secondary | ICD-10-CM | POA: Diagnosis not present

## 2023-12-12 DIAGNOSIS — I509 Heart failure, unspecified: Secondary | ICD-10-CM | POA: Diagnosis not present

## 2023-12-12 DIAGNOSIS — Z66 Do not resuscitate: Secondary | ICD-10-CM | POA: Diagnosis not present

## 2023-12-12 DIAGNOSIS — I5033 Acute on chronic diastolic (congestive) heart failure: Secondary | ICD-10-CM | POA: Diagnosis not present

## 2023-12-12 DIAGNOSIS — Z7189 Other specified counseling: Secondary | ICD-10-CM | POA: Diagnosis not present

## 2023-12-12 LAB — BASIC METABOLIC PANEL WITH GFR
Anion gap: 8 (ref 5–15)
BUN: 13 mg/dL (ref 8–23)
CO2: 36 mmol/L — ABNORMAL HIGH (ref 22–32)
Calcium: 9 mg/dL (ref 8.9–10.3)
Chloride: 94 mmol/L — ABNORMAL LOW (ref 98–111)
Creatinine, Ser: 0.63 mg/dL (ref 0.44–1.00)
GFR, Estimated: >60 mL/min (ref >60)
Glucose, Bld: 116 mg/dL — ABNORMAL HIGH (ref 70–99)
Potassium: 4 mmol/L (ref 3.5–5.1)
Sodium: 138 mmol/L (ref 135–145)

## 2023-12-12 LAB — MAGNESIUM: Magnesium: 1.7 mg/dL (ref 1.7–2.4)

## 2023-12-12 LAB — CULTURE, BLOOD (ROUTINE X 2)
Culture: NO GROWTH
Culture: NO GROWTH

## 2023-12-12 LAB — PHOSPHORUS: Phosphorus: 2.5 mg/dL (ref 2.5–4.6)

## 2023-12-12 MED ORDER — MAGNESIUM CHLORIDE 64 MG PO TBEC
1.0000 | DELAYED_RELEASE_TABLET | Freq: Every day | ORAL | Status: DC
Start: 2023-12-12 — End: 2023-12-13
  Administered 2023-12-12 – 2023-12-13 (×2): 64 mg via ORAL
  Filled 2023-12-12 (×2): qty 1

## 2023-12-12 NOTE — Plan of Care (Signed)

## 2023-12-12 NOTE — Progress Notes (Signed)
 Physical Therapy Treatment Patient Details Name: Mary [name] MRN: [id number] DOB: 04-20-1935 Today's Date: 12/12/2023   History of Present Illness Pt is an 89 y/o F admitted on 12/07/23 after presenting with worsening cough, SOB & peripheral edema. Pt found to have B pleural effusion, elevated HR, & a-fib with RVR. Pt is being treated for acute on chronic HFpEF & a-fib with RVR. PMH: HTN, chronic HFpEF, CAD, hypothyroidism, recurrent nosebleed, question of PAF, asthma, c diff infection, lumbar DDD, depression, DM2, diabetic retinopathy, heart murmur, HLD, RA, sleep apnea, spinal stenosis    PT Comments  Pt up to chair with nursing for lunch. Unable to stand without assist today on multiple attempts.  Min a x 1 to stand and poor balance with general extension posture needing cues to relax and stand upright.  On first attempt needs to void upon standing.  Sat for BSC to be obtained and transferred to/from with RW and min a x 1 with very unsteady steps.  She wants to try her own self care but is unable without mod a x 1 to prevent fall.  She does stand an additional trial at chairside for marching in place but after about 8 reps is fatigued and needs to sit.  She does continue to c/o right UE/LE pain and some weakness.    Pt is now agreeable to SNF given increased difficulties with mobility.  She is aware that if she goes home she is unable to care for herself and would likely fall and have a readmission.  She is encouraged call her case worker to look into more services for her daughter as little is in place for her.   If plan is discharge home, recommend the following: A little help with walking and/or transfers;A little help with bathing/dressing/bathroom;Assistance with cooking/housework;Assist for transportation;Help with stairs or ramp for entrance   Can travel by private vehicle        Equipment Recommendations  BSC/3in1;Wheelchair (measurements PT);Wheelchair cushion (measurements PT)     Recommendations for Other Services       Precautions / Restrictions Precautions Precautions: Fall Recall of Precautions/Restrictions: Intact Restrictions Weight Bearing Restrictions Per Provider Order: No     Mobility  Bed Mobility               General bed mobility comments: in chair before and after Patient Response: Cooperative  Transfers Overall transfer level: Needs assistance Equipment used: Rolling walker (2 wheels) Transfers: Sit to/from Stand Sit to Stand: Min assist, Mod assist           General transfer comment: uanble to stand without assist today    Ambulation/Gait Ambulation/Gait assistance: Min assist Gait Distance (Feet): 3 Feet Assistive device: Rolling walker (2 wheels) Gait Pattern/deviations: Step-to pattern Gait velocity: decreased     General Gait Details: poor quality steps to chair.  would have resulted in fall without assist   Stairs             Wheelchair Mobility     Tilt Bed Tilt Bed Patient Response: Cooperative  Modified Rankin (Stroke Patients Only)       Balance Overall balance assessment: Needs assistance Sitting-balance support: Feet supported Sitting balance-Leahy Scale: Fair     Standing balance support: Bilateral upper extremity supported Standing balance-Leahy Scale: Poor Standing balance comment: uanble to attend to own care after voiding without assist to prevent fall  Communication Communication Communication: No apparent difficulties  Cognition Arousal: Alert Behavior During Therapy: WFL for tasks assessed/performed   PT - Cognitive impairments: No apparent impairments                         Following commands: Intact      Cueing Cueing Techniques: Verbal cues  Exercises      General Comments        Pertinent Vitals/Pain Pain Assessment Pain Assessment: Faces Faces Pain Scale: Hurts even more Pain Location: R arm and leg Pain  Descriptors / Indicators: Aching Pain Intervention(s): Limited activity within patient's tolerance, Monitored during session, Repositioned    Home Living                          Prior Function            PT Goals (current goals can now be found in the care plan section) Progress towards PT goals: Not progressing toward goals - comment    Frequency    Min 3X/week      PT Plan      Co-evaluation              AM-PAC PT "6 Clicks" Mobility   Outcome Measure  Help needed turning from your back to your side while in a flat bed without using bedrails?: A Lot Help needed moving from lying on your back to sitting on the side of a flat bed without using bedrails?: A Lot Help needed moving to and from a bed to a chair (including a wheelchair)?: A Lot Help needed standing up from a chair using your arms (e.g., wheelchair or bedside chair)?: A Lot Help needed to walk in hospital room?: A Lot Help needed climbing 3-5 steps with a railing? : Total 6 Click Score: 11    End of Session Equipment Utilized During Treatment: Gait belt;Oxygen Activity Tolerance: Patient tolerated treatment well;Patient limited by fatigue Patient left: in chair;with chair alarm set;with call bell/phone within reach Nurse Communication: Mobility status PT Visit Diagnosis: Muscle weakness (generalized) (M62.81);Unsteadiness on feet (R26.81)     Time: 1610-9604 PT Time Calculation (min) (ACUTE ONLY): 23 min  Charges:    $Therapeutic Activity: 23-37 mins PT General Charges $$ ACUTE PT VISIT: 1 Visit                   Charlanne Cong, PTA 12/12/23, 3:17 PM

## 2023-12-12 NOTE — Progress Notes (Signed)
 [facility] CLINIC CARDIOLOGY PROGRESS NOTE   [name] MRN: [id number] DOB/AGE: 1935/01/01 89 y.o.  Admit date: 12/07/2023 Referring Physician Dr. Melodi Sprung Primary Physician Monique Ano, MD  Primary Cardiologist Dr. Larinda Plover Reason for Consultation AF RVR, heart failure   HPI: Mary [name] is a 89 y.o. female  with a past medical history of coronary artery disease by CT scan, peripheral vascular disease s/p L CEA, poorly controlled HTN, hyperlipidemia, type 2 diabetes, pulmonary hypertension who presented to the ED on 12/07/2023 for SOB and worsening edema. Found to be in AF RVR. Cardiology was consulted for further evaluation.    Interval History:  -Patient seen and examined at bedside, resting comfortably.  -BP and HR are stable.  -Patient reports feeling "groggy"  -Patient states SOB and LE edema has improved.  -Patient reports walking around unit with PT yesterday. -Discharge plan likely today, awaiting DME/HH.  Review of systems complete and found to be negative unless listed above    Vitals:   12/12/23 0028 12/12/23 0347 12/12/23 0500 12/12/23 0811  BP: 120/68 104/81  122/72  Pulse: 72 90  99  Resp: 20 20  19   Temp: 98.1 F (36.7 C) 97.9 F (36.6 C)  98.6 F (37 C)  TempSrc: Oral   Oral  SpO2: 98% 91%  92%  Weight:   68.6 kg   Height:         Intake/Output Summary (Last 24 hours) at 12/12/2023 1026 Last data filed at 12/11/2023 1900 Gross per 24 hour  Intake 20 ml  Output --  Net 20 ml     PHYSICAL EXAM General: awake, well nourished, in no acute distress. HEENT: Normocephalic and atraumatic. Neck: No JVD.  Lungs: Normal respiratory effort. Clear bilaterally to auscultation. No wheezes, crackles, rhonchi.  Heart: HRRR. Normal S1 and S2 without gallops or murmurs. Radial & DP pulses 2+ bilaterally. Abdomen: Non-distended appearing.  Msk: Normal strength and tone for age. Extremities: No clubbing, cyanosis or edema.   Neuro:  Alert and oriented X 3. Psych: Mood appropriate, affect congruent.    LABS: Basic Metabolic Panel: Recent Labs    12/10/23 0843 12/11/23 0538 12/12/23 0513  NA 136 135 138  K 3.7 3.4* 4.0  CL 93* 93* 94*  CO2 35* 34* 36*  GLUCOSE 121* 120* 116*  BUN 16 13 13   CREATININE 0.66 0.65 0.63  CALCIUM  8.9 9.1 9.0  MG 1.7  --  1.7  PHOS  --   --  2.5   Liver Function Tests: No results for input(s): "AST", "ALT", "ALKPHOS", "BILITOT", "PROT", "ALBUMIN" in the last 72 hours.  No results for input(s): "LIPASE", "AMYLASE" in the last 72 hours. CBC: No results for input(s): "WBC", "NEUTROABS", "HGB", "HCT", "MCV", "PLT" in the last 72 hours.  Cardiac Enzymes: No results for input(s): "CKTOTAL", "CKMB", "CKMBINDEX", "TROPONINIHS" in the last 72 hours.  BNP: No results for input(s): "BNP" in the last 72 hours.  D-Dimer: No results for input(s): "DDIMER" in the last 72 hours. Hemoglobin A1C: No results for input(s): "HGBA1C" in the last 72 hours. Fasting Lipid Panel: No results for input(s): "CHOL", "HDL", "LDLCALC", "TRIG", "CHOLHDL", "LDLDIRECT" in the last 72 hours. Thyroid Function Tests: No results for input(s): "TSH", "T4TOTAL", "T3FREE", "THYROIDAB" in the last 72 hours.  Invalid input(s): "FREET3"  Anemia Panel: No results for input(s): "VITAMINB12", "FOLATE", "FERRITIN", "TIBC", "IRON ", "RETICCTPCT" in the last 72 hours.  No results found.    ECHO 12/08/2023 1. Left ventricular ejection fraction,  by estimation, is 60 to 65%. The left ventricle has normal function. The left ventricle has no regional wall motion abnormalities. Left ventricular diastolic parameters are indeterminate.   2. Right ventricular systolic function is normal. The right ventricular size is normal.   3. Left atrial size was mildly dilated.   4. Large pleural effusion.   5. The mitral valve is normal in structure. Trivial mitral valve regurgitation. No evidence of mitral stenosis.   6. The aortic  valve is normal in structure. Aortic valve regurgitation is not visualized. No aortic stenosis is present.   7. The inferior vena cava is normal in size with greater than 50% respiratory variability, suggesting right atrial pressure of 3 mmHg.    TELEMETRY reviewed by me 12/08/2023: atrial fibrillation, rate 90s   EKG reviewed by me: AF RVR rate 112 bpm   Data reviewed by me 12/08/2023: last 24h vitals tele labs imaging I/O hospitalist progress note.   ASSESSMENT AND PLAN:  Principal Problem:   CHF (congestive heart failure) (HCC) Active Problems:   Acute respiratory failure with hypoxia (HCC)   Acute on chronic diastolic CHF (congestive heart failure) (HCC)   A-fib (HCC)   Mary [name] is a 89 y.o. female  with a past medical history of coronary artery disease by CT scan, peripheral vascular disease s/p L CEA, poorly controlled HTN, hyperlipidemia, type 2 diabetes, pulmonary hypertension who presented to the ED on 12/07/2023 for SOB and worsening edema. Found to be in AF RVR. Cardiology was consulted for further evaluation.    # Atrial fibrillation RVR # Presumed heart failure preserved EF Patient reported to the ED with worsening shortness of breath and lower extremity edema with elevated BNP of 520. Atrial fibrillation RVR on initial EKG. Per tele patient still in atrial fibrillation with rates in 90s this AM. BP and HR remain stable. Patient states SOB and LE edema have improved. Patient appears euvolemic on exam. -Continue PO Lasix  40 mg twice daily. Renal function is stable. -Continue Coreg  25 mg twice daily, irbesartan  300 mg daily, hydralazine  50 mg 3 times daily,  -Continue Crestor  5 mg daily. -Patient has history of severe nose bleeds and DAPT has been held for 2 months. CHA?DS?-VASc score is 7 with age, gender, Hx of CHF, PVD, and T2DM would recommend long-term anticoagulation.  -ASA discontinued. Continue Eliquis  5 mg BID, monitor for bleeding  Ok for discharge today from a  cardiac perspective. Will arrange for follow up in clinic with Dr. Larinda Plover in 1-2 weeks.    Creighton Doffing, PA-C 12/12/2023, 10:26 AM [facility] Cardiology

## 2023-12-12 NOTE — Progress Notes (Signed)
 Palliative Care Progress Note, Assessment & Plan   Patient Name: Mary [name]       Date: 12/12/2023 DOB: 30-Aug-1934  Age: 89 y.o. MRN#: [id number] Attending Physician: Melodi Sprung, DO Primary Care Physician: Monique Ano, MD Admit Date: 12/07/2023  Subjective: Reports not sleeping well due to pain in right arm that was relieved with oxycodone .  Reports right arm pain now 4/10.  Extreme fatigue and weakness today.  States she just wants to rest.  Does not feel that she is ready to go home but still refusing rehab.  Worked with PT yesterday but was only able to walk to door and back.  HPI: 89 y.o. female  with past medical history of HTN, chronic HFpEF, CAD, hypothyroidism, recurrent nosebleed off dual antiplatelet therapy, question of PAF. She presented to ED after 7 days worsening SHOB and peripheral edema.    ED workup found pt to be febrile with heart rate 130-150, blood pressure elevated SBP 140-150s.  Chest x-ray showed pulmonary congestion and small bilateral pleural effusions.  Blood work showed K 4.0, creatinine 0.7 WBC 6.1, hemoglobin 13.5. UA showed 2+ WBC.   Patient was given IV Lasix  x 1.  IR 1000 mL, Levaquin  x 1.   She was admitted for management of CHF, acute respiratory failure with hypoxia, acute on chronic diastolic heart failure and A-fib with RVR.    Palliative care consulted for goals of care discussion.   Summary of counseling/coordination of care: Extensive chart review completed prior to meeting patient including labs, vital signs, imaging, progress notes, orders, and available advanced directive documents from current and previous encounters.   After reviewing the patient's chart and assessing the patient at bedside, I spoke with patient in regards to symptom  management and goals of care.   Elderly, ill-appearing female sitting upright in recliner.  She is alert and oriented but appears more weak than yesterday.  She is in no distress.  Acknowledged her wanting to return home but emphasized the importance of remaining in the hospital to regain strength and work with PT so she will be able to function independently when she returns to her home. She is in agreement with this plan but adamantly refuses rehab as she is concerned about her daughter with CP being alone at night.  Her daughter has an in-home caregiver during the day.   Therapeutic silence and active listening provided for patient to share her thoughts and emotions regarding current medical situation.  Emotional support provided.  Physical Exam Constitutional:      General: She is not in acute distress.    Appearance: She is ill-appearing.     Comments: Weakness  HENT:     Head: Normocephalic and atraumatic.     Nose:     Comments: O2 via [place] Pulmonary:     Effort: Pulmonary effort is normal. No respiratory distress.  Musculoskeletal:     Right lower leg: No edema.     Left lower leg: No edema.  Skin:    General: Skin is warm and dry.  Neurological:     Mental Status: She is alert and oriented to person, place, and time.  Psychiatric:  Mood and Affect: Mood normal.        Behavior: Behavior normal.        Thought Content: Thought content normal.        Judgment: Judgment normal.             Total Time 35 minutes   Time spent includes: Detailed review of medical records (labs, imaging, vital signs), medically appropriate exam (mental status, respiratory, cardiac, skin), discussed with treatment team, counseling and educating patient, family and staff, documenting clinical information, medication management and coordination of care.     Ina Manas, Joyice Nodal [facility] Palliative Medicine Team  12/12/2023 9:13 AM  Office [phone]  Pager [phone]

## 2023-12-12 NOTE — Evaluation (Signed)
 Occupational Therapy Evaluation Patient Details Name: Mary [name] MRN: [id number] DOB: 05-17-1935 Today's Date: 12/12/2023   History of Present Illness   Pt is an 89 y/o F admitted on 12/07/23 after presenting with worsening cough, SOB & peripheral edema. Pt found to have B pleural effusion, elevated HR, & a-fib with RVR. Pt is being treated for acute on chronic HFpEF & a-fib with RVR. PMH: HTN, chronic HFpEF, CAD, hypothyroidism, recurrent nosebleed, question of PAF, asthma, c diff infection, lumbar DDD, depression, DM2, diabetic retinopathy, heart murmur, HLD, RA, sleep apnea, spinal stenosis    Clinical Impressions Mary [name] was seen for OT evaluation this date. Prior to hospital admission, pt was IND. 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 Pt currently requires MIN A exit bed, poor sitting balance with posterior lean. MOD A + RW for BSC t/f - multiple LOBs with B knees buckling in standing. MIN A + RW standing pericare, assist for balance. Pt educated on high fall risk and concerns for returning home, discussed DME recs if pt does return home to remain w/c level. Pt would benefit from skilled OT to address noted impairments and functional limitations (see below for any additional details). Upon hospital discharge, recommend OT follow up.    If plan is discharge home, recommend the following:   A lot of help with walking and/or transfers;A lot of help with bathing/dressing/bathroom;Help with stairs or ramp for entrance     Functional Status Assessment   Patient has had a recent decline in their functional status and demonstrates the ability to make significant improvements in function in a reasonable and predictable amount of time.     Equipment Recommendations   BSC/3in1;Wheelchair (measurements OT);Wheelchair cushion (measurements OT)     Recommendations for Other Services         Precautions/Restrictions   Precautions Precautions: Fall Recall of  Precautions/Restrictions: Intact Restrictions Weight Bearing Restrictions Per Provider Order: No     Mobility Bed Mobility Overal bed mobility: Needs Assistance Bed Mobility: Supine to Sit     Supine to sit: Min assist, HOB elevated, Used rails          Transfers Overall transfer level: Needs assistance Equipment used: Rolling walker (2 wheels) Transfers: Sit to/from Stand, Bed to chair/wheelchair/BSC Sit to Stand: Mod assist     Step pivot transfers: Mod assist            Balance Overall balance assessment: Needs assistance Sitting-balance support: Feet supported Sitting balance-Leahy Scale: Fair     Standing balance support: Single extremity supported, During functional activity Standing balance-Leahy Scale: Poor Standing balance comment: multiple LOBs and B knee buckling noted                           ADL either performed or assessed with clinical judgement   ADL Overall ADL's : Needs assistance/impaired                                       General ADL Comments: MOD A + RW for BSC t/f. MIN A + RW standing pericare, assist for balance. MAX A don B socks in sitting      Pertinent Vitals/Pain Pain Assessment Pain Assessment: No/denies pain     Extremity/Trunk Assessment Upper Extremity Assessment Upper Extremity Assessment: Generalized weakness (mild B tremor with activity)   Lower Extremity Assessment Lower  Extremity Assessment: Generalized weakness       Communication Communication Communication: No apparent difficulties   Cognition Arousal: Alert Behavior During Therapy: WFL for tasks assessed/performed Cognition: No apparent impairments                               Following commands: Intact       Cueing  General Comments   Cueing Techniques: Verbal cues      Exercises     Shoulder Instructions      Home Living Family/patient expects to be discharged to:: Private residence Living  Arrangements: Children Available Help at Discharge: Family;Available PRN/intermittently Type of Home: House Home Access: Stairs to enter Entrance Stairs-Number of Steps: 3+1 Entrance Stairs-Rails: Can reach both;Right;Left Home Layout: One level     Bathroom Shower/Tub: Producer, television/film/video: Handicapped height     Home Equipment: Pharmacist, hospital (2 wheels);Rollator (4 wheels)   Additional Comments: Pt lives with her daughter who has CP. 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      Prior Functioning/Environment Prior Level of Function : Independent/Modified Independent             Mobility Comments: 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 If aid does not come pt helps daughter with bed mobility Pt reports she's ambulatory with hurrycane or RW, denies falls, reports she drove last ~3 weeks ago.      OT Problem List: Decreased strength;Decreased range of motion;Decreased activity tolerance;Impaired balance (sitting and/or standing)   OT Treatment/Interventions: Self-care/ADL training;Therapeutic exercise;Energy conservation;DME and/or AE instruction;Therapeutic activities;Balance training;Patient/family education      OT Goals(Current goals can be found in the care plan section)   Acute Rehab OT Goals Patient Stated Goal: to go home OT Goal Formulation: With patient Time For Goal Achievement: 12/26/23 Potential to Achieve Goals: Good ADL Goals Pt Will Perform Grooming: with modified independence;sitting Pt Will Perform Lower Body Dressing: with modified independence;sitting/lateral leans Pt Will Transfer to Toilet: with modified independence;ambulating;bedside commode   OT Frequency:  Min 3X/week    Co-evaluation              AM-PAC OT "6 Clicks" Daily Activity     Outcome Measure Help from another person eating  meals?: None Help from another person taking care of personal grooming?: A Little Help from another person toileting, which includes using toliet, bedpan, or urinal?: A Lot Help from another person bathing (including washing, rinsing, drying)?: A Lot Help from another person to put on and taking off regular upper body clothing?: A Little Help from another person to put on and taking off regular lower body clothing?: A Lot 6 Click Score: 16   End of Session Nurse Communication: Mobility status  Activity Tolerance: Patient tolerated treatment well Patient left: in chair;with call bell/phone within reach;with chair alarm set;with nursing/sitter in room  OT Visit Diagnosis: Other abnormalities of gait and mobility (R26.89);Muscle weakness (generalized) (M62.81)                Time: 1610-9604 OT Time Calculation (min): 21 min Charges:  OT General Charges $OT Visit: 1 Visit OT Evaluation $OT Eval Moderate Complexity: 1 Mod OT Treatments $Self Care/Home Management : 8-22 mins  Gordan Latina, M.S. OTR/L  12/12/23, 10:24 AM  ascom [phone]

## 2023-12-12 NOTE — Plan of Care (Signed)
  Problem: Education: Goal: Knowledge of General Education information will improve Description: Including pain rating scale, medication(s)/side effects and non-pharmacologic comfort measures Outcome: Progressing   Problem: Health Behavior/Discharge Planning: Goal: Ability to manage health-related needs will improve Outcome: Progressing   Problem: Clinical Measurements: Goal: Ability to maintain clinical measurements within normal limits will improve Outcome: Progressing   Problem: Clinical Measurements: Goal: Will remain free from infection Outcome: Progressing   Problem: Activity: Goal: Risk for activity intolerance will decrease Outcome: Progressing   Problem: Nutrition: Goal: Adequate nutrition will be maintained Outcome: Progressing   Problem: Pain Managment: Goal: General experience of comfort will improve and/or be controlled Outcome: Progressing   Problem: Safety: Goal: Ability to remain free from injury will improve Outcome: Progressing   Problem: Skin Integrity: Goal: Risk for impaired skin integrity will decrease Outcome: Progressing    Plan of care ongoing, see MAR see flowsheet

## 2023-12-12 NOTE — Progress Notes (Addendum)
 PROGRESS NOTE    Mary [name]   YQM:[id number] DOB: [birth year]  DOA: 12/07/2023 Date of Service: 12/12/23 which is hospital day 5  PCP: Monique Ano, MD    Hospital course / significant events:   HPI:  Mary [name] is a 89 y.o. female with medical history significant of HTN, chronic HFpEF, CAD, hypothyroidism, recurrent nosebleed off dual antiplatelet therapy, question of PAF, presented from home with worsening of cough shortness of breath and peripheral edema. Onset 7 days prior. Came to ED when noting SOB at rest.    04/16: to ED. CXR pulmonary congestion and bilateral pleural effusion, elevated HR and Afib RVR on EKG. Admitted to hospitalist service for acute on chronic HFpEF and Afib RVR. IV lasix , digoxin  loading IV also ordered 04/17: HR remains labile, occasionally sustaining 130s-140s. Increase beta blocker. Echo results pending. Cardiology consulted re: continuation digoxin  vs other, HR responding to beta blocker so will not continue digoxin  04/18: thoracentesis removed 150 mL. HR still up a bit, monitoring  04/19: transition to po lasix , coreg  increased again today and HR is improved. Restarted Eliquis , dc ASA per cardiology. 04/20: improving on increased beta blocker, cardiology states ok to dc from their standpoint, likely for dc tomorrow once we can arrange DME/HH  04/21: pt still weak and worse compared to yesterday, she is now amenable to SNF rehab, TOC informed      Consultants:  Cardiology  Palliative care   Procedures/Surgeries: 12/09/23 L thoracentesis      ASSESSMENT & PLAN:   Acute hypoxic respiratory failure Due to pulmonary edema and pleural effusion from CHF O2 supplement, wean as able Treat underlying cause(s)  L Pleural effusion - noted large on Echo, moderate on CXR Decreased lung sounds L lung base likely effusion Thoracentesis 04/18 removed 150 mL L chest   Acute on chronic HFpEF decompensation Essential HTN Question secondary to  A-fib with RVR or may be primary/initial problem Echocardiogram --> EF 60-65%, no RWMA, indeterminate diastolic parameters but suspect some diastolic dysfunction given clinical CHF  Treat Afib as noted above IV Lasix  --> po  Coreg  25 mg bid Irbesartan  300 mg daily Hydralazine  50 mg 3 times daily Aspirin  81 mg daily d/c and restart Eliquis  5 mg bid per cardiology  Crestor  5 mg daily. Strict I&O's   PAF with RVR RVR resolved  Question secondary to CHF or may be primary/initial problem Coreg  12.5 mg twice daily --> 25 mg bid today  defer additional digoxin . Follow potassium, magnesium  and phosphorus levels and correct electrolytes as needed CHADS2VASC = 7, indication for systemic anticoagulation.  However patient reported that she has had repeated severe nosebleed recently and antiplatelet therapy of dual antiplatelet aspirin  Plavix  have been held for 2 months. Aspirin  81 mg daily d/c and restart Eliquis  5 mg bid per cardiology    SIRS assoc w/ acute resp fail, Afib, CHF Treat as above No leukocytosis/infection, no sepsis   Abnormal UA  Asymptomatic bacteriuria  UCx no growth VS more c/w SIRS/Afib/CHF rather than sepsis, denies urinary symptoms, will not treat Follow UCx --> no growth   Hypokalemia Replace as needed - ordered 40 mg yesterday for breakfast and lunch yesterday, she only got lunch dose yesterday and then breakfast dose this AM... Monitor BMP  Hypothyroidism TSH WNL, not likely to be contributing to cardiac issues as above  Continue Synthroid    Anxiety/depression Continue Prozac and trazodone  at bedtime   IIDM A1c 6.1 Continue diet control   Deconditioning PT evaluation - pt  very weak and likely inadequate reserve, she is declining SNF rehab, reassess tomorrow     overweight based on BMI: Body mass index is 25.55 kg/m.  Underweight - under 18  overweight - 25 to 29 obese - 30 or more Class 1 obesity: BMI of 30.0 to 34 Class 2 obesity: BMI of 35.0 to  39 Class 3 obesity: BMI of 40.0 to 49 Super Morbid Obesity: BMI 50-59 Super-super Morbid Obesity: BMI 60+ Significantly low or high BMI is associated with higher medical risk.  Weight management advised as adjunct to other disease management and risk reduction treatments    DVT prophylaxis: Eliquis   IV fluids: no continuous IV fluids  Nutrition: cardiac diet Central lines / other devices: none  Code Status: DNR ACP documentation reviewed:  none on file in [facility]  [facility] needs: SNF rehab Medical barriers to dispo: none, cardiology has cleared, pending other complications can go one SNF rehab arranged             Subjective / Brief ROS:  Patient reports breathing and edema are same today compared to yesterday, better overall SHe is frustrated at how weak she is today,  Denies CP/SOB at rest No palpations  Pain a bit worse overnight  Denies new weakness.  Tolerating diet.  Reports no concerns w/ urination/defecation.   Family Communication: called daughter on the phone 12/12/23 4:41 PM answered all questions     Objective Findings:  Vitals:   12/12/23 0028 12/12/23 0347 12/12/23 0500 12/12/23 0811  BP: 120/68 104/81  122/72  Pulse: 72 90  99  Resp: 20 20  19   Temp: 98.1 F (36.7 C) 97.9 F (36.6 C)  98.6 F (37 C)  TempSrc: Oral   Oral  SpO2: 98% 91%  92%  Weight:   68.6 kg   Height:        Intake/Output Summary (Last 24 hours) at 12/12/2023 1126 Last data filed at 12/12/2023 0900 Gross per 24 hour  Intake 140 ml  Output --  Net 140 ml    Filed Weights   12/10/23 0500 12/11/23 0517 12/12/23 0500  Weight: 70.4 kg 68.2 kg 68.6 kg    Examination:  Physical Exam Constitutional:      General: She is not in acute distress. Cardiovascular:     Rate and Rhythm: Normal rate. Rhythm irregular.  Pulmonary:     Effort: Pulmonary effort is normal.     Breath sounds: No decreased breath sounds or rales (improved).  Musculoskeletal:     Right lower leg:  Edema (trace/improved) present.     Left lower leg: Edema (trace/improved) present.     Comments: Pt reports LE edema has improved compared to yesterday   Neurological:     General: No focal deficit present.     Mental Status: She is alert and oriented to person, place, and time.  Psychiatric:        Mood and Affect: Mood normal.        Behavior: Behavior normal.          Scheduled Medications:   apixaban   5 mg Oral BID   busPIRone   10 mg Oral BID   carvedilol   25 mg Oral BID WC   Fe Fum-Vit C-Vit B12-FA  1 capsule Oral Daily   furosemide   40 mg Oral BID   gabapentin   200 mg Oral QHS   hydrALAZINE   50 mg Oral TID   irbesartan   300 mg Oral Daily   ketotifen   1 drop  Both Eyes BID   levothyroxine   75 mcg Oral Daily   mirabegron  ER  25 mg Oral Daily   pantoprazole   20 mg Oral Daily   PARoxetine   40 mg Oral QPM   rosuvastatin   5 mg Oral Daily   sodium chloride  flush  3 mL Intravenous Q12H    Continuous Infusions:    PRN Medications:  acetaminophen , cyclobenzaprine , ondansetron  (ZOFRAN ) IV, sodium chloride  flush, traZODone   Antimicrobials from admission:  Anti-infectives (From admission, onward)    Start     Dose/Rate Route Frequency Ordered Stop   12/07/23 1300  levofloxacin  (LEVAQUIN ) IVPB 750 mg        750 mg 100 mL/hr over 90 Minutes Intravenous  Once 12/07/23 1254 12/07/23 1459           Data Reviewed:  I have personally reviewed the following...  CBC: Recent Labs  Lab 12/07/23 1242  WBC 6.1  NEUTROABS 4.3  HGB 13.5  HCT 42.9  MCV 100.0  PLT 189   Basic Metabolic Panel: Recent Labs  Lab 12/07/23 1502 12/08/23 0522 12/09/23 0510 12/10/23 0843 12/11/23 0538 12/12/23 0513  NA  --  138 138 136 135 138  K  --  3.3* 2.8* 3.7 3.4* 4.0  CL  --  96* 94* 93* 93* 94*  CO2  --  36* 35* 35* 34* 36*  GLUCOSE  --  102* 118* 121* 120* 116*  BUN  --  10 14 16 13 13   CREATININE  --  0.72 0.80 0.66 0.65 0.63  CALCIUM   --  8.5* 8.8* 8.9 9.1 9.0  MG  1.9  --   --  1.7  --  1.7  PHOS 2.5  --   --   --   --  2.5   GFR: Estimated Creatinine Clearance: 47.3 mL/min (by C-G formula based on SCr of 0.63 mg/dL). Liver Function Tests: Recent Labs  Lab 12/07/23 1242  AST 30  ALT 11  ALKPHOS 58  BILITOT 0.6  PROT 6.8  ALBUMIN 3.5   No results for input(s): "LIPASE", "AMYLASE" in the last 168 hours. No results for input(s): "AMMONIA" in the last 168 hours. Coagulation Profile: Recent Labs  Lab 12/07/23 1309  INR 1.1   Cardiac Enzymes: No results for input(s): "CKTOTAL", "CKMB", "CKMBINDEX", "TROPONINI" in the last 168 hours. BNP (last 3 results) No results for input(s): "PROBNP" in the last 8760 hours. HbA1C: No results for input(s): "HGBA1C" in the last 72 hours. CBG: Recent Labs  Lab 12/08/23 1208  GLUCAP 117*   Lipid Profile: No results for input(s): "CHOL", "HDL", "LDLCALC", "TRIG", "CHOLHDL", "LDLDIRECT" in the last 72 hours. Thyroid Function Tests: No results for input(s): "TSH", "T4TOTAL", "FREET4", "T3FREE", "THYROIDAB" in the last 72 hours.  Anemia Panel: No results for input(s): "VITAMINB12", "FOLATE", "FERRITIN", "TIBC", "IRON ", "RETICCTPCT" in the last 72 hours. Most Recent Urinalysis On File:     Component Value Date/Time   COLORURINE YELLOW (A) 12/07/2023 1309   APPEARANCEUR HAZY (A) 12/07/2023 1309   APPEARANCEUR Hazy (A) 05/09/2023 1300   LABSPEC 1.005 12/07/2023 1309   LABSPEC 1.005 12/29/2013 0119   PHURINE 6.0 12/07/2023 1309   GLUCOSEU NEGATIVE 12/07/2023 1309   GLUCOSEU Negative 12/29/2013 0119   HGBUR SMALL (A) 12/07/2023 1309   BILIRUBINUR NEGATIVE 12/07/2023 1309   BILIRUBINUR Negative 05/09/2023 1300   BILIRUBINUR Negative 12/29/2013 0119   KETONESUR NEGATIVE 12/07/2023 1309   PROTEINUR NEGATIVE 12/07/2023 1309   NITRITE NEGATIVE 12/07/2023 1309   LEUKOCYTESUR LARGE (A) 12/07/2023  1309   LEUKOCYTESUR 1+ 12/29/2013 0119   Sepsis  Labs: @LABRCNTIP (procalcitonin:4,lacticidven:4) Microbiology: Recent Results (from the past 240 hours)  Resp panel by RT-PCR (RSV, Flu A&B, Covid) Anterior Nasal Swab     Status: None   Collection Time: 12/07/23  1:09 PM   Specimen: Anterior Nasal Swab  Result Value Ref Range Status   SARS Coronavirus 2 by RT PCR NEGATIVE NEGATIVE Final    Comment: (NOTE) SARS-CoV-2 target nucleic acids are NOT DETECTED.  The SARS-CoV-2 RNA is generally detectable in upper respiratory specimens during the acute phase of infection. The lowest concentration of SARS-CoV-2 viral copies this assay can detect is 138 copies/mL. A negative result does not preclude SARS-Cov-2 infection and should not be used as the sole basis for treatment or other patient management decisions. A negative result may occur with  improper specimen collection/handling, submission of specimen other than nasopharyngeal swab, presence of viral mutation(s) within the areas targeted by this assay, and inadequate number of viral copies(<138 copies/mL). A negative result must be combined with clinical observations, patient history, and epidemiological information. The expected result is Negative.  Fact Sheet for Patients:  BloggerCourse.com  Fact Sheet for Healthcare Providers:  SeriousBroker.it  This test is no t yet approved or cleared by the [place]  FDA and  has been authorized for detection and/or diagnosis of SARS-CoV-2 by FDA under an Emergency Use Authorization (EUA). This EUA will remain  in effect (meaning this test can be used) for the duration of the COVID-19 declaration under Section 564(b)(1) of the Act, 21 U.S.C.section 360bbb-3(b)(1), unless the authorization is terminated  or revoked sooner.       Influenza A by PCR NEGATIVE NEGATIVE Final   Influenza B by PCR NEGATIVE NEGATIVE Final    Comment: (NOTE) The Xpert Xpress SARS-CoV-2/FLU/RSV plus assay is  intended as an aid in the diagnosis of influenza from Nasopharyngeal swab specimens and should not be used as a sole basis for treatment. Nasal washings and aspirates are unacceptable for Xpert Xpress SARS-CoV-2/FLU/RSV testing.  Fact Sheet for Patients: BloggerCourse.com  Fact Sheet for Healthcare Providers: SeriousBroker.it  This test is not yet approved or cleared by the [place]  FDA and has been authorized for detection and/or diagnosis of SARS-CoV-2 by FDA under an Emergency Use Authorization (EUA). This EUA will remain in effect (meaning this test can be used) for the duration of the COVID-19 declaration under Section 564(b)(1) of the Act, 21 U.S.C. section 360bbb-3(b)(1), unless the authorization is terminated or revoked.     Resp Syncytial Virus by PCR NEGATIVE NEGATIVE Final    Comment: (NOTE) Fact Sheet for Patients: BloggerCourse.com  Fact Sheet for Healthcare Providers: SeriousBroker.it  This test is not yet approved or cleared by the [place]  FDA and has been authorized for detection and/or diagnosis of SARS-CoV-2 by FDA under an Emergency Use Authorization (EUA). This EUA will remain in effect (meaning this test can be used) for the duration of the COVID-19 declaration under Section 564(b)(1) of the Act, 21 U.S.C. section 360bbb-3(b)(1), unless the authorization is terminated or revoked.  Performed at [facility], [address] [place], [place] [address]   Blood Culture (routine x 2)     Status: None   Collection Time: 12/07/23  1:09 PM   Specimen: BLOOD  Result Value Ref Range Status   Specimen Description   Final    BLOOD Blood Culture results may not be optimal due to an inadequate volume of blood received in culture bottles  Special Requests   Final    BOTTLES DRAWN AEROBIC AND ANAEROBIC RIGHT ANTECUBITAL   Culture   Final    NO  GROWTH 5 DAYS Performed at [facility], [address] [place], [place] [address]    Report Status 12/12/2023 FINAL  Final  Blood Culture (routine x 2)     Status: None   Collection Time: 12/07/23  1:09 PM   Specimen: BLOOD  Result Value Ref Range Status   Specimen Description   Final    BLOOD Blood Culture results may not be optimal due to an inadequate volume of blood received in culture bottles   Special Requests   Final    BOTTLES DRAWN AEROBIC AND ANAEROBIC LEFT ANTECUBITAL   Culture   Final    NO GROWTH 5 DAYS Performed at [facility], [address] [place], [place] [address]    Report Status 12/12/2023 FINAL  Final  Urine Culture     Status: None   Collection Time: 12/07/23  1:09 PM   Specimen: Urine, Clean Catch  Result Value Ref Range Status   Specimen Description   Final    URINE, CLEAN CATCH Performed at [facility], [address], [place], [place] [address]    Special Requests   Final    NONE Reflexed from [id number] Performed at [facility], [address] [place], [place] [address]    Culture   Final    NO GROWTH Performed at [facility], [address] [place], [place] [address]    Report Status 12/08/2023 FINAL  Final      Radiology Studies last 3 days: US  THORACENTESIS ASP PLEURAL SPACE W/IMG GUIDE Result Date: 12/09/2023 INDICATION: Acute hypoxic respiratory failure.  Request for a thoracentesis. EXAM: ULTRASOUND GUIDED LEFT THORACENTESIS MEDICATIONS: None. COMPLICATIONS: None immediate. PROCEDURE: An ultrasound guided thoracentesis was thoroughly discussed with the patient and questions answered. The benefits, risks, alternatives and complications were also discussed. The patient understands and wishes to proceed with the procedure. Written consent was obtained. Ultrasound was performed to localize and mark an adequate pocket of fluid in the left chest. The area was then prepped and draped in the normal sterile  fashion. 1% Lidocaine  was used for local anesthesia. Under ultrasound guidance a 6 Fr Safe-T-Centesis catheter was introduced. Thoracentesis was performed. The catheter was removed and a dressing applied. FINDINGS: Ultrasound demonstrated small bilateral pleural effusions. Left pleural effusion was slightly larger. A total of approximately 150 mL of yellow fluid was removed. Samples were sent to the laboratory as requested by the clinical team. IMPRESSION: Successful ultrasound guided left thoracentesis yielding 150 mL of pleural fluid. Electronically Signed   By: Elene Griffes M.D.   On: 12/09/2023 16:37   DG Chest Port 1 View Result Date: 12/09/2023 CLINICAL DATA:  142230 Pleural effusion [id number] Status post thoracentesis [id number] EXAM: PORTABLE CHEST 1 VIEW COMPARISON:  12/08/2023. FINDINGS: There is small left pleural effusion, decreased since the prior study, status post thoracentesis. No pneumothorax. There is trace right pleural effusion, which is similar to the prior study. Bilateral lung fields are otherwise clear. Stable cardio-mediastinal silhouette. No acute osseous abnormalities. The soft tissues are within normal limits. IMPRESSION: Small left pleural effusion, decreased since the prior study, status post thoracentesis. No pneumothorax. Electronically Signed   By: Beula Brunswick M.D.   On: 12/09/2023 15:45       Time spent: 35 min     Tandra Rosado, DO [facility]  12/12/2023, 11:26 AM    Dictation software may have been used to generate the above note. Typos may occur and escape review in typed/dictated notes. Please contact Dr Authur Leghorn directly for clarity if needed.  Staff may message me via secure chat in Epic  but this may not receive an immediate response,  please page me for urgent matters!  If 7PM-7AM, please contact night coverage [email or web address]

## 2023-12-13 DIAGNOSIS — I509 Heart failure, unspecified: Secondary | ICD-10-CM | POA: Diagnosis not present

## 2023-12-13 DIAGNOSIS — Z7189 Other specified counseling: Secondary | ICD-10-CM | POA: Diagnosis not present

## 2023-12-13 DIAGNOSIS — Z515 Encounter for palliative care: Secondary | ICD-10-CM | POA: Diagnosis not present

## 2023-12-13 DIAGNOSIS — Z66 Do not resuscitate: Secondary | ICD-10-CM | POA: Diagnosis not present

## 2023-12-13 LAB — BASIC METABOLIC PANEL WITH GFR
Anion gap: 8 (ref 5–15)
BUN: 20 mg/dL (ref 8–23)
CO2: 36 mmol/L — ABNORMAL HIGH (ref 22–32)
Calcium: 9.4 mg/dL (ref 8.9–10.3)
Chloride: 93 mmol/L — ABNORMAL LOW (ref 98–111)
Creatinine, Ser: 0.71 mg/dL (ref 0.44–1.00)
GFR, Estimated: >60 mL/min (ref >60)
Glucose, Bld: 115 mg/dL — ABNORMAL HIGH (ref 70–99)
Potassium: 4.1 mmol/L (ref 3.5–5.1)
Sodium: 137 mmol/L (ref 135–145)

## 2023-12-13 LAB — MAGNESIUM: Magnesium: 1.7 mg/dL (ref 1.7–2.4)

## 2023-12-13 MED ORDER — FLEET ENEMA RE ENEM: 1.0000 | ENEMA | Freq: Every day | RECTAL | Status: DC | PRN

## 2023-12-13 MED ORDER — MAGNESIUM CHLORIDE 64 MG PO TBEC
2.0000 | DELAYED_RELEASE_TABLET | Freq: Every day | ORAL | Status: DC
  Administered 2023-12-14 – 2023-12-16 (×3): 128 mg via ORAL
  Filled 2023-12-13 (×3): qty 2

## 2023-12-13 MED ORDER — POLYETHYLENE GLYCOL 3350 17 G PO PACK
17.0000 g | PACK | Freq: Every day | ORAL | Status: DC
  Administered 2023-12-13 – 2023-12-16 (×2): 17 g via ORAL
  Filled 2023-12-13 (×3): qty 1

## 2023-12-13 MED ORDER — SENNOSIDES-DOCUSATE SODIUM 8.6-50 MG PO TABS
2.0000 | ORAL_TABLET | Freq: Once | ORAL | Status: AC
  Administered 2023-12-13: 2 via ORAL
  Filled 2023-12-13: qty 2

## 2023-12-13 MED ORDER — BISACODYL 10 MG RE SUPP: 10.0000 mg | Freq: Every day | RECTAL | Status: DC | PRN

## 2023-12-13 MED ORDER — IRBESARTAN 150 MG PO TABS
150.0000 mg | ORAL_TABLET | Freq: Every day | ORAL | Status: DC
  Administered 2023-12-14: 150 mg via ORAL
  Filled 2023-12-13: qty 1

## 2023-12-13 NOTE — Progress Notes (Signed)
 Occupational Therapy Treatment Patient Details Name: Mary [name] MRN: [id number] DOB: 10-06-1934 Today's Date: 12/13/2023   History of present illness Pt is an 89 y/o F admitted on 12/07/23 after presenting with worsening cough, SOB & peripheral edema. Pt found to have B pleural effusion, elevated HR, & a-fib with RVR. Pt is being treated for acute on chronic HFpEF & a-fib with RVR. PMH: HTN, chronic HFpEF, CAD, hypothyroidism, recurrent nosebleed, question of PAF, asthma, c diff infection, lumbar DDD, depression, DM2, diabetic retinopathy, heart murmur, HLD, RA, sleep apnea, spinal stenosis   OT comments  Chart reviewed to date, pt greeted in room, alert and oriented x4, agreeable to OT tx session targeting improving functional activity tolerance in preparation for ADL tasks. Pt reports she feels progressively weaker on this date. MIN A-CGA required for bed mobility, STS 3 attempts with MIN A with RW. Pt reports worsening dizziness in standing, BP in sitting 96/71 (MAP 81) HR 90 bpm, standing 94/54 (MAP 63) HR 90s bpm. Pt unable to remain standing for 3 min due to reported dizziness/weakness. SET UP required for grooming tasks on this date. Team notified of pt status. OT will continue to follow acutely.       If plan is discharge home, recommend the following:  A lot of help with walking and/or transfers;A lot of help with bathing/dressing/bathroom;Help with stairs or ramp for entrance   Equipment Recommendations  BSC/3in1;Wheelchair (measurements OT);Wheelchair cushion (measurements OT)    Recommendations for Other Services      Precautions / Restrictions Precautions Precautions: Fall Recall of Precautions/Restrictions: Intact Restrictions Weight Bearing Restrictions Per Provider Order: No       Mobility Bed Mobility Overal bed mobility: Needs Assistance Bed Mobility: Supine to Sit, Sit to Supine     Supine to sit: HOB elevated, Used rails, Min assist Sit to supine: Contact  guard assist, HOB elevated, Used rails        Transfers Overall transfer level: Needs assistance Equipment used: Rolling walker (2 wheels) Transfers: Sit to/from Stand Sit to Stand: Min assist           General transfer comment: 3 attempts with RW     Balance Overall balance assessment: Needs assistance Sitting-balance support: Feet supported Sitting balance-Leahy Scale: Fair     Standing balance support: Bilateral upper extremity supported, Reliant on assistive device for balance Standing balance-Leahy Scale: Poor                             ADL either performed or assessed with clinical judgement   ADL Overall ADL's : Needs assistance/impaired Eating/Feeding: Set up;Sitting   Grooming: Sitting;Set up                   Toilet Transfer: Minimal assistance;Rolling walker (2 wheels) Toilet Transfer Details (indicate cue type and reason): simulated                Extremity/Trunk Assessment              Vision       Perception     Praxis     Communication Communication Communication: Impaired Factors Affecting Communication: Hearing impaired   Cognition Arousal: Alert Behavior During Therapy: WFL for tasks assessed/performed Cognition: No apparent impairments                               Following commands: Intact  Cueing   Cueing Techniques: Verbal cues  Exercises Other Exercises Other Exercises: edu re: role of OT, role of rehab, discharge recommendations    Shoulder Instructions       General Comments      Pertinent Vitals/ Pain       Pain Assessment Pain Assessment: 0-10 Pain Score: 5  Pain Location: neck, posterior head Pain Descriptors / Indicators: Discomfort, Shooting Pain Intervention(s): Limited activity within patient's tolerance, Monitored during session, Repositioned, Other (comment) (notified RN via phone call'; RN/Dr Authur Leghorn via secure chat)  Home Living                                           Prior Functioning/Environment              Frequency  Min 3X/week        Progress Toward Goals  OT Goals(current goals can now be found in the care plan section)  Progress towards OT goals: Progressing toward goals  Acute Rehab OT Goals Time For Goal Achievement: 12/26/23  Plan      Co-evaluation                 AM-PAC OT "6 Clicks" Daily Activity     Outcome Measure   Help from another person eating meals?: None Help from another person taking care of personal grooming?: None Help from another person toileting, which includes using toliet, bedpan, or urinal?: A Lot Help from another person bathing (including washing, rinsing, drying)?: A Lot Help from another person to put on and taking off regular upper body clothing?: A Little Help from another person to put on and taking off regular lower body clothing?: A Lot 6 Click Score: 17    End of Session Equipment Utilized During Treatment: Rolling walker (2 wheels)  OT Visit Diagnosis: Other abnormalities of gait and mobility (R26.89);Muscle weakness (generalized) (M62.81)   Activity Tolerance Patient tolerated treatment well   Patient Left in bed;with bed alarm set;with call bell/phone within reach   Nurse Communication Mobility status;Other (comment) (RN/MD re: vitals and reported pain)        Time: 1610-9604 OT Time Calculation (min): 20 min  Charges: OT General Charges $OT Visit: 1 Visit OT Treatments $Therapeutic Activity: 8-22 mins  Gerre Kraft, OTD OTR/L  12/13/23, 3:15 PM

## 2023-12-13 NOTE — [place] FL2 (Signed)
 [place]  MEDICAID FL2 LEVEL OF CARE FORM     IDENTIFICATION  Patient Name: Mary [name] Birthdate: 03-08-1935 Sex: female Admission Date (Current Location): 12/07/2023  [facility] and [place] Number:  Chiropodist and Address:  [facility], [address] [place], [place] [address]      Provider Number: [id number]  Attending Physician Name and Address:  Melodi Sprung, DO  Relative Name and Phone Number:  Becka, [place] (Daughter)  [phone] (Mobile)    Current Level of Care: Hospital Recommended Level of Care: Skilled Nursing Facility Prior Approval Number:    Date Approved/Denied:   PASRR Number:    Discharge Plan:      Current Diagnoses: Patient Active Problem List   Diagnosis Date Noted   A-fib (HCC) 12/07/2023   CHF (congestive heart failure) (HCC) 12/07/2023   Left flank pain 07/25/2023   Hydronephrosis of left kidney 07/25/2023   Hypokalemia 07/24/2023   UTI (urinary tract infection) 07/23/2023   Renal artery stenosis (HCC) 11/12/2022   Hypertension, renovascular 11/12/2022   Osteoarthritis of carpometacarpal (CMC) joint of thumb 01/19/2022   Type 2 diabetes mellitus with hyperlipidemia (HCC) 01/01/2022   Carotid stenosis, right 12/02/2021   Acute on chronic diastolic CHF (congestive heart failure) (HCC) 09/25/2021   IDA (iron  deficiency anemia) 02/02/2021   Atherosclerosis of abdominal aorta (HCC) 03/19/2020   Coronary artery disease involving native coronary artery of native heart 03/19/2020   SOBOE (shortness of breath on exertion) 03/19/2020   Asthma    Acute respiratory failure with hypoxia (HCC)    Hypertensive retinopathy, bilateral 06/08/2019   Mild nonproliferative diabetic retinopathy associated with type 2 diabetes mellitus (HCC) 06/08/2019   Type 2 diabetes mellitus with diabetic retinopathy (HCC) 05/31/2019   Branch retinal vein occlusion of right eye 05/23/2019   Hx of diabetic retinopathy 05/23/2019    Carotid stenosis 01/18/2019   History of fracture of left hip 10/26/2018   Hip fracture (HCC) 09/28/2018   Dizziness 09/28/2018   History of normocytic normochromic anemia 06/13/2018   Psychophysiological insomnia 06/13/2018   Status post total knee replacement using cement, left 12/01/2017   Synovial cyst of left popliteal space 05/27/2017   Hypertensive urgency 05/19/2017   Diabetic polyneuropathy associated with type 2 diabetes mellitus (HCC) 03/30/2017   Anxiety 05/19/2016   Insomnia 05/19/2016   Pure hypercholesterolemia 05/19/2016   Personal history of disease 03/11/2016   Status post hip hemiarthroplasty 10/10/2015   Hypoxia 09/26/2015   Hypoxemia 09/26/2015   Fracture of femoral neck, right (HCC) 09/23/2015   Diabetes mellitus without complication (HCC) 09/23/2015   Uncontrolled hypertension 09/23/2015   HLD (hyperlipidemia) 09/23/2015   GERD (gastroesophageal reflux disease) 09/23/2015   Depression 09/23/2015   Hypothyroidism 09/23/2015   DDD (degenerative disc disease), lumbar 08/26/2015   Lumbar stenosis with neurogenic claudication 08/26/2015   Nocturia 04/29/2015   Urinary frequency 04/29/2015   Atrophic vaginitis 04/29/2015   Mixed incontinence 06/28/2014   Lumbar radiculopathy 02/08/2013    Orientation RESPIRATION BLADDER Height & Weight     Self, Time, Situation, Place  O2 (022L) External catheter Weight: 69.5 kg Height:  5\' 6"  (167.6 cm)  BEHAVIORAL SYMPTOMS/MOOD NEUROLOGICAL BOWEL NUTRITION STATUS  Other (Comment) (n/a)  (n/a) Continent Diet  AMBULATORY STATUS COMMUNICATION OF NEEDS Skin   Limited Assist Verbally Bruising, Other (Comment) (erythema and eechymosis bilateral arm leg hand)                       Personal Care Assistance  Level of Assistance  Bathing, Feeding, Dressing Bathing Assistance: Limited assistance Feeding assistance: Limited assistance Dressing Assistance: Limited assistance     Functional Limitations Info              SPECIAL CARE FACTORS FREQUENCY  PT (By licensed PT), OT (By licensed OT)     PT Frequency: Min 2x weekly OT Frequency: Min 2x weekly            Contractures Contractures Info: Not present    Additional Factors Info  Code Status, Allergies Code Status Info: DNR Allergies Info: Cephalexin, Nitrofurantoin, Sulfa Antibiotics, Atorvastatin           Current Medications (12/13/2023):  This is the current hospital active medication list Current Facility-Administered Medications  Medication Dose Route Frequency Provider Last Rate Last Admin   acetaminophen  (TYLENOL ) tablet 500 mg  500 mg Oral Q6H PRN Antoniette Batty T, MD   500 mg at 12/11/23 1704   apixaban  (ELIQUIS ) tablet 5 mg  5 mg Oral BID Patel, Kishan S, RPH   5 mg at 12/13/23 1036   bisacodyl  (DULCOLAX) suppository 10 mg  10 mg Rectal Daily PRN Alexander, Natalie, DO       busPIRone  (BUSPAR ) tablet 10 mg  10 mg Oral BID Zhang, Ping T, MD   10 mg at 12/13/23 1036   carvedilol  (COREG ) tablet 25 mg  25 mg Oral BID WC Alexander, Natalie, DO   25 mg at 12/13/23 1036   cyclobenzaprine  (FLEXERIL ) tablet 5 mg  5 mg Oral TID PRN Alexander, Natalie, DO   5 mg at 12/12/23 2159   Fe Fum-Vit C-Vit B12-FA (TRIGELS-F FORTE) capsule 1 capsule  1 capsule Oral Daily Antoniette Batty T, MD   1 capsule at 12/13/23 9528   furosemide  (LASIX ) tablet 40 mg  40 mg Oral BID Alexander, Natalie, DO   40 mg at 12/13/23 1036   gabapentin  (NEURONTIN ) capsule 200 mg  200 mg Oral QHS Alexander, Natalie, DO   200 mg at 12/12/23 2159   [START ON 12/14/2023] irbesartan  (AVAPRO ) tablet 150 mg  150 mg Oral Daily Alexander, Natalie, DO       ketotifen  (ZADITOR ) 0.035 % ophthalmic solution 1 drop  1 drop Both Eyes BID Antoniette Batty T, MD   1 drop at 12/13/23 1039   levothyroxine  (SYNTHROID ) tablet 75 mcg  75 mcg Oral Daily Antoniette Batty T, MD   75 mcg at 12/13/23 0518   magnesium  chloride (SLOW-MAG) 64 MG SR tablet 64 mg  1 tablet Oral Daily Alexander, Natalie, DO   64 mg at  12/13/23 4132   mirabegron  ER (MYRBETRIQ ) tablet 25 mg  25 mg Oral Daily Zhang, Ping T, MD   25 mg at 12/13/23 4401   ondansetron  (ZOFRAN ) injection 4 mg  4 mg Intravenous Q6H PRN Antoniette Batty T, MD       pantoprazole  (PROTONIX ) EC tablet 20 mg  20 mg Oral Daily Zhang, Ping T, MD   20 mg at 12/13/23 0908   PARoxetine  (PAXIL ) tablet 40 mg  40 mg Oral QPM Antoniette Batty T, MD   40 mg at 12/12/23 1726   polyethylene glycol (MIRALAX  / GLYCOLAX ) packet 17 g  17 g Oral Daily Alexander, Natalie, DO   17 g at 12/13/23 1034   rosuvastatin  (CRESTOR ) tablet 5 mg  5 mg Oral Daily Antoniette Batty T, MD   5 mg at 12/13/23 0272   sodium chloride  flush (NS) 0.9 % injection 3 mL  3 mL Intravenous Q12H  [place], MD   3 mL at 12/13/23 7829   sodium chloride  flush (NS) 0.9 % injection 3 mL  3 mL Intravenous PRN [place], MD       sodium phosphate  (FLEET) enema 1 enema  1 enema Rectal Daily PRN Alexander, Natalie, DO       traZODone  (DESYREL ) tablet 25 mg  25 mg Oral QHS PRN [place], MD   25 mg at 12/12/23 2159     Discharge Medications: Please see discharge summary for a list of discharge medications.  Relevant Imaging Results:  Relevant Lab Results:   Additional Information SSN [id number]  Baird Bombard, RN

## 2023-12-13 NOTE — Progress Notes (Signed)
 PROGRESS NOTE    Mary [name]   WGN:[id number] DOB: 1934-12-13  DOA: 12/07/2023 Date of Service: 12/13/23 which is hospital day 6  PCP: Monique Ano, MD    Hospital course / significant events:   HPI:  Mary [name] is a 89 y.o. female with medical history significant of HTN, chronic HFpEF, CAD, hypothyroidism, recurrent nosebleed off dual antiplatelet therapy, question of PAF, presented from home with worsening of cough shortness of breath and peripheral edema. Onset 7 days prior. Came to ED when noting SOB at rest.    04/16: to ED. CXR pulmonary congestion and bilateral pleural effusion, elevated HR and Afib RVR on EKG. Admitted to hospitalist service for acute on chronic HFpEF and Afib RVR. IV lasix , digoxin  loading IV also ordered 04/17: HR remains labile, occasionally sustaining 130s-140s. Increase beta blocker. Echo results pending. Cardiology consulted re: continuation digoxin  vs other, HR responding to beta blocker so will not continue digoxin  04/18: thoracentesis removed 150 mL. HR still up a bit, monitoring  04/19: transition to po lasix , coreg  increased again today and HR is improved. Restarted Eliquis , dc ASA per cardiology. 04/20: improving on increased beta blocker, cardiology states ok to dc from their standpoint, likely for dc tomorrow once we can arrange DME/HH  04/21: pt still weak and worse compared to yesterday, she is now amenable to SNF rehab, TOC informed  04/22: placement in process. Reduced dose BP meds given some dizziness / soft BP but HR has been at goal.      Consultants:  Cardiology  Palliative care   Procedures/Surgeries: 12/09/23 L thoracentesis      ASSESSMENT & PLAN:   Acute hypoxic respiratory failure Due to pulmonary edema and pleural effusion from CHF O2 supplement, wean as able Treat underlying cause(s)  L Pleural effusion - noted large on Echo, moderate on CXR Decreased lung sounds L lung base likely effusion Thoracentesis  04/18 removed 150 mL L chest   Acute on chronic HFpEF decompensation Essential HTN Question secondary to A-fib with RVR or may be primary/initial problem Echocardiogram --> EF 60-65%, no RWMA, indeterminate diastolic parameters but suspect some diastolic dysfunction given clinical CHF  Treat Afib as noted above Furosemide  40 mg po bid  Coreg  25 mg bid Irbesartan  300 mg daily --> reduced to 150 mg daily d/t soft BP Hydralazine  50 mg 3 times daily --> holding d/t soft BP Aspirin  81 mg daily d/c, restart Eliquis  5 mg bid per cardiology  Crestor  5 mg daily. Strict I&O's   PAF with RVR RVR resolved  Question secondary to CHF or may be primary/initial problem Coreg  12.5 mg twice daily --> 25 mg bid is controlling rate   defer additional digoxin . Follow potassium, magnesium  and phosphorus levels and correct electrolytes as needed CHADS2VASC = 7, indication for systemic anticoagulation.  However patient reported that she has had repeated severe nosebleed recently and antiplatelet therapy of dual antiplatelet aspirin  Plavix  have been held for 2 months. Aspirin  81 mg daily d/c and restart Eliquis  5 mg bid per cardiology    SIRS assoc w/ acute resp fail, Afib, CHF - resolved  Treat as above No leukocytosis/infection, no sepsis   Abnormal UA  Asymptomatic bacteriuria  UCx no growth VS more c/w SIRS/Afib/CHF rather than sepsis, denies urinary symptoms, will not treat Follow UCx --> no growth   Hypokalemia Replace as needed  Monitor BMP  Hypothyroidism TSH WNL, not likely to be contributing to cardiac issues as above  Continue Synthroid    Anxiety/depression Continue  Prozac and trazodone  at bedtime   IIDM A1c 6.1 Continue diet control   Deconditioning PT evaluation - pt very weak and likely inadequate reserve, she is now amenable to SNF rehab Placement in process     overweight based on BMI: Body mass index is 25.55 kg/m.  Underweight - under 18  overweight - 25 to 29 obese  - 30 or more Class 1 obesity: BMI of 30.0 to 34 Class 2 obesity: BMI of 35.0 to 39 Class 3 obesity: BMI of 40.0 to 49 Super Morbid Obesity: BMI 50-59 Super-super Morbid Obesity: BMI 60+ Significantly low or high BMI is associated with higher medical risk.  Weight management advised as adjunct to other disease management and risk reduction treatments    DVT prophylaxis: Eliquis   IV fluids: no continuous IV fluids  Nutrition: cardiac diet Central lines / other devices: none  Code Status: DNR ACP documentation reviewed:  none on file in [facility]  [facility] needs: SNF rehab Medical barriers to dispo: none, cardiology has cleared, pending other complications can go once SNF rehab arranged             Subjective / Brief ROS:  Patient reports breathing and edema are a bit better today compared to yesterday SHe is frustrated at how weak she is but is still ok for rehab though she is upset that this will have to happen as she would prefer home  Denies CP/SOB at rest No palpations  Pain a bit worse overnight  Denies new weakness.  Tolerating diet.  Reports no concerns w/ urination/defecation.   Family Communication: will reach out to daughter later today   Objective Findings:  Vitals:   12/13/23 0326 12/13/23 0500 12/13/23 0749 12/13/23 1337  BP: 117/66  123/73 102/63  Pulse: 89  86 81  Resp: 20  19 16   Temp: 97.8 F (36.6 C)  98.6 F (37 C) (!) 97.5 F (36.4 C)  TempSrc:      SpO2: 95%  93% 96%  Weight:  69.5 kg    Height:        Intake/Output Summary (Last 24 hours) at 12/13/2023 1528 Last data filed at 12/13/2023 0981 Gross per 24 hour  Intake 280 ml  Output --  Net 280 ml    Filed Weights   12/11/23 0517 12/12/23 0500 12/13/23 0500  Weight: 68.2 kg 68.6 kg 69.5 kg    Examination:  Physical Exam Constitutional:      General: She is not in acute distress. Cardiovascular:     Rate and Rhythm: Normal rate. Rhythm irregular.  Pulmonary:     Effort:  Pulmonary effort is normal.     Breath sounds: No decreased breath sounds or rales (improved).  Musculoskeletal:     Right lower leg: Edema (trace/improved) present.     Left lower leg: Edema (trace/improved) present.     Comments: Pt reports LE edema has improved compared to yesterday   Neurological:     General: No focal deficit present.     Mental Status: She is alert and oriented to person, place, and time.  Psychiatric:        Mood and Affect: Mood normal.        Behavior: Behavior normal.          Scheduled Medications:   apixaban   5 mg Oral BID   busPIRone   10 mg Oral BID   carvedilol   25 mg Oral BID WC   Fe Fum-Vit C-Vit B12-FA  1 capsule Oral Daily  furosemide   40 mg Oral BID   gabapentin   200 mg Oral QHS   [START ON 12/14/2023] irbesartan   150 mg Oral Daily   ketotifen   1 drop Both Eyes BID   levothyroxine   75 mcg Oral Daily   magnesium  chloride  1 tablet Oral Daily   mirabegron  ER  25 mg Oral Daily   pantoprazole   20 mg Oral Daily   PARoxetine   40 mg Oral QPM   polyethylene glycol  17 g Oral Daily   rosuvastatin   5 mg Oral Daily   sodium chloride  flush  3 mL Intravenous Q12H    Continuous Infusions:    PRN Medications:  acetaminophen , bisacodyl , cyclobenzaprine , ondansetron  (ZOFRAN ) IV, sodium chloride  flush, sodium phosphate , traZODone   Antimicrobials from admission:  Anti-infectives (From admission, onward)    Start     Dose/Rate Route Frequency Ordered Stop   12/07/23 1300  levofloxacin  (LEVAQUIN ) IVPB 750 mg        750 mg 100 mL/hr over 90 Minutes Intravenous  Once 12/07/23 1254 12/07/23 1459           Data Reviewed:  I have personally reviewed the following...  CBC: Recent Labs  Lab 12/07/23 1242  WBC 6.1  NEUTROABS 4.3  HGB 13.5  HCT 42.9  MCV 100.0  PLT 189   Basic Metabolic Panel: Recent Labs  Lab 12/07/23 1502 12/08/23 0522 12/09/23 0510 12/10/23 0843 12/11/23 0538 12/12/23 0513 12/13/23 0529  NA  --    < > 138  136 135 138 137  K  --    < > 2.8* 3.7 3.4* 4.0 4.1  CL  --    < > 94* 93* 93* 94* 93*  CO2  --    < > 35* 35* 34* 36* 36*  GLUCOSE  --    < > 118* 121* 120* 116* 115*  BUN  --    < > 14 16 13 13 20   CREATININE  --    < > 0.80 0.66 0.65 0.63 0.71  CALCIUM   --    < > 8.8* 8.9 9.1 9.0 9.4  MG 1.9  --   --  1.7  --  1.7 1.7  PHOS 2.5  --   --   --   --  2.5  --    < > = values in this interval not displayed.   GFR: Estimated Creatinine Clearance: 47.3 mL/min (by C-G formula based on SCr of 0.71 mg/dL). Liver Function Tests: Recent Labs  Lab 12/07/23 1242  AST 30  ALT 11  ALKPHOS 58  BILITOT 0.6  PROT 6.8  ALBUMIN 3.5   No results for input(s): "LIPASE", "AMYLASE" in the last 168 hours. No results for input(s): "AMMONIA" in the last 168 hours. Coagulation Profile: Recent Labs  Lab 12/07/23 1309  INR 1.1   Cardiac Enzymes: No results for input(s): "CKTOTAL", "CKMB", "CKMBINDEX", "TROPONINI" in the last 168 hours. BNP (last 3 results) No results for input(s): "PROBNP" in the last 8760 hours. HbA1C: No results for input(s): "HGBA1C" in the last 72 hours. CBG: Recent Labs  Lab 12/08/23 1208  GLUCAP 117*   Lipid Profile: No results for input(s): "CHOL", "HDL", "LDLCALC", "TRIG", "CHOLHDL", "LDLDIRECT" in the last 72 hours. Thyroid Function Tests: No results for input(s): "TSH", "T4TOTAL", "FREET4", "T3FREE", "THYROIDAB" in the last 72 hours.  Anemia Panel: No results for input(s): "VITAMINB12", "FOLATE", "FERRITIN", "TIBC", "IRON ", "RETICCTPCT" in the last 72 hours. Most Recent Urinalysis On File:     Component Value  Date/Time   COLORURINE YELLOW (A) 12/07/2023 1309   APPEARANCEUR HAZY (A) 12/07/2023 1309   APPEARANCEUR Hazy (A) 05/09/2023 1300   LABSPEC 1.005 12/07/2023 1309   LABSPEC 1.005 12/29/2013 0119   PHURINE 6.0 12/07/2023 1309   GLUCOSEU NEGATIVE 12/07/2023 1309   GLUCOSEU Negative 12/29/2013 0119   HGBUR SMALL (A) 12/07/2023 1309   BILIRUBINUR  NEGATIVE 12/07/2023 1309   BILIRUBINUR Negative 05/09/2023 1300   BILIRUBINUR Negative 12/29/2013 0119   KETONESUR NEGATIVE 12/07/2023 1309   PROTEINUR NEGATIVE 12/07/2023 1309   NITRITE NEGATIVE 12/07/2023 1309   LEUKOCYTESUR LARGE (A) 12/07/2023 1309   LEUKOCYTESUR 1+ 12/29/2013 0119   Sepsis Labs: @LABRCNTIP (procalcitonin:4,lacticidven:4) Microbiology: Recent Results (from the past 240 hours)  Resp panel by RT-PCR (RSV, Flu A&B, Covid) Anterior Nasal Swab     Status: None   Collection Time: 12/07/23  1:09 PM   Specimen: Anterior Nasal Swab  Result Value Ref Range Status   SARS Coronavirus 2 by RT PCR NEGATIVE NEGATIVE Final    Comment: (NOTE) SARS-CoV-2 target nucleic acids are NOT DETECTED.  The SARS-CoV-2 RNA is generally detectable in upper respiratory specimens during the acute phase of infection. The lowest concentration of SARS-CoV-2 viral copies this assay can detect is 138 copies/mL. A negative result does not preclude SARS-Cov-2 infection and should not be used as the sole basis for treatment or other patient management decisions. A negative result may occur with  improper specimen collection/handling, submission of specimen other than nasopharyngeal swab, presence of viral mutation(s) within the areas targeted by this assay, and inadequate number of viral copies(<138 copies/mL). A negative result must be combined with clinical observations, patient history, and epidemiological information. The expected result is Negative.  Fact Sheet for Patients:  BloggerCourse.com  Fact Sheet for Healthcare Providers:  SeriousBroker.it  This test is no t yet approved or cleared by the [place]  FDA and  has been authorized for detection and/or diagnosis of SARS-CoV-2 by FDA under an Emergency Use Authorization (EUA). This EUA will remain  in effect (meaning this test can be used) for the duration of the COVID-19  declaration under Section 564(b)(1) of the Act, 21 U.S.C.section 360bbb-3(b)(1), unless the authorization is terminated  or revoked sooner.       Influenza A by PCR NEGATIVE NEGATIVE Final   Influenza B by PCR NEGATIVE NEGATIVE Final    Comment: (NOTE) The Xpert Xpress SARS-CoV-2/FLU/RSV plus assay is intended as an aid in the diagnosis of influenza from Nasopharyngeal swab specimens and should not be used as a sole basis for treatment. Nasal washings and aspirates are unacceptable for Xpert Xpress SARS-CoV-2/FLU/RSV testing.  Fact Sheet for Patients: BloggerCourse.com  Fact Sheet for Healthcare Providers: SeriousBroker.it  This test is not yet approved or cleared by the [place]  FDA and has been authorized for detection and/or diagnosis of SARS-CoV-2 by FDA under an Emergency Use Authorization (EUA). This EUA will remain in effect (meaning this test can be used) for the duration of the COVID-19 declaration under Section 564(b)(1) of the Act, 21 U.S.C. section 360bbb-3(b)(1), unless the authorization is terminated or revoked.     Resp Syncytial Virus by PCR NEGATIVE NEGATIVE Final    Comment: (NOTE) Fact Sheet for Patients: BloggerCourse.com  Fact Sheet for Healthcare Providers: SeriousBroker.it  This test is not yet approved or cleared by the [place]  FDA and has been authorized for detection and/or diagnosis of SARS-CoV-2 by FDA under an Emergency Use Authorization (EUA). This EUA will remain in effect (meaning this test  can be used) for the duration of the COVID-19 declaration under Section 564(b)(1) of the Act, 21 U.S.C. section 360bbb-3(b)(1), unless the authorization is terminated or revoked.  Performed at [facility], [address] [place], [place] [address]   Blood Culture (routine x 2)     Status: None   Collection Time: 12/07/23  1:09 PM    Specimen: BLOOD  Result Value Ref Range Status   Specimen Description   Final    BLOOD Blood Culture results may not be optimal due to an inadequate volume of blood received in culture bottles   Special Requests   Final    BOTTLES DRAWN AEROBIC AND ANAEROBIC RIGHT ANTECUBITAL   Culture   Final    NO GROWTH 5 DAYS Performed at [facility], [address] [place], [place] [address]    Report Status 12/12/2023 FINAL  Final  Blood Culture (routine x 2)     Status: None   Collection Time: 12/07/23  1:09 PM   Specimen: BLOOD  Result Value Ref Range Status   Specimen Description   Final    BLOOD Blood Culture results may not be optimal due to an inadequate volume of blood received in culture bottles   Special Requests   Final    BOTTLES DRAWN AEROBIC AND ANAEROBIC LEFT ANTECUBITAL   Culture   Final    NO GROWTH 5 DAYS Performed at [facility], [address], [place], [place] [address]    Report Status 12/12/2023 FINAL  Final  Urine Culture     Status: None   Collection Time: 12/07/23  1:09 PM   Specimen: Urine, Clean Catch  Result Value Ref Range Status   Specimen Description   Final    URINE, CLEAN CATCH Performed at [facility], [address] [place], [place] [address]    Special Requests   Final    NONE Reflexed from [id number] Performed at [facility], [address] [place], [place] [address]    Culture   Final    NO GROWTH Performed at [facility], [address], [place], [place] [address]    Report Status 12/08/2023 FINAL  Final      Radiology Studies last 3 days: US  THORACENTESIS ASP PLEURAL SPACE W/IMG GUIDE Result Date: 12/09/2023 INDICATION: Acute hypoxic respiratory failure.  Request for a thoracentesis. EXAM: ULTRASOUND GUIDED LEFT THORACENTESIS MEDICATIONS: None. COMPLICATIONS: None immediate. PROCEDURE: An ultrasound guided thoracentesis was thoroughly discussed with the patient and questions answered. The  benefits, risks, alternatives and complications were also discussed. The patient understands and wishes to proceed with the procedure. Written consent was obtained. Ultrasound was performed to localize and mark an adequate pocket of fluid in the left chest. The area was then prepped and draped in the normal sterile fashion. 1% Lidocaine  was used for local anesthesia. Under ultrasound guidance a 6 Fr Safe-T-Centesis catheter was introduced. Thoracentesis was performed. The catheter was removed and a dressing applied. FINDINGS: Ultrasound demonstrated small bilateral pleural effusions. Left pleural effusion was slightly larger. A total of approximately 150 mL of yellow fluid was removed. Samples were sent to the laboratory as requested by the clinical team. IMPRESSION: Successful ultrasound guided left thoracentesis yielding 150 mL of pleural fluid. Electronically Signed   By: Elene Griffes M.D.   On: 12/09/2023 16:37   DG Chest Port 1 View Result Date: 12/09/2023 CLINICAL DATA:  142230 Pleural effusion 142230 [id number] Status post thoracentesis [id number] EXAM: PORTABLE CHEST 1  VIEW COMPARISON:  12/08/2023. FINDINGS: There is small left pleural effusion, decreased since the prior study, status post thoracentesis. No pneumothorax. There is trace right pleural effusion, which is similar to the prior study. Bilateral lung fields are otherwise clear. Stable cardio-mediastinal silhouette. No acute osseous abnormalities. The soft tissues are within normal limits. IMPRESSION: Small left pleural effusion, decreased since the prior study, status post thoracentesis. No pneumothorax. Electronically Signed   By: Beula Brunswick M.D.   On: 12/09/2023 15:45       Time spent: 35 min     Melodi Sprung, DO [facility] 12/13/2023, 3:28 PM    Dictation software may have been used to generate the above note. Typos may occur and escape review in typed/dictated notes. Please contact Dr Authur Leghorn directly for clarity if  needed.  Staff may message me via secure chat in Epic  but this may not receive an immediate response,  please page me for urgent matters!  If 7PM-7AM, please contact night coverage [email or web address]

## 2023-12-13 NOTE — Progress Notes (Signed)
 Physical Therapy Treatment Patient Details Name: Mary [name] MRN: [id number] DOB: March 26, 1935 Today's Date: 12/13/2023   History of Present Illness Pt is an 89 y/o F admitted on 12/07/23 after presenting with worsening cough, SOB & peripheral edema. Pt found to have B pleural effusion, elevated HR, & a-fib with RVR. Pt is being treated for acute on chronic HFpEF & a-fib with RVR. PMH: HTN, chronic HFpEF, CAD, hypothyroidism, recurrent nosebleed, question of PAF, asthma, c diff infection, lumbar DDD, depression, DM2, diabetic retinopathy, heart murmur, HLD, RA, sleep apnea, spinal stenosis    PT Comments  C/o neck and R arm pain continuing today.  To EOB with bed features and cga/min a x 1. Steady in sitting. Stands with min a x 1 but needs mod a x 1 for post/extension with transfer to chair.  Small cautious steps to chair.  Declined further activity at this time.     If plan is discharge home, recommend the following: A little help with walking and/or transfers;A little help with bathing/dressing/bathroom;Assistance with cooking/housework;Assist for transportation;Help with stairs or ramp for entrance   Can travel by private vehicle        Equipment Recommendations  BSC/3in1;Wheelchair (measurements PT);Wheelchair cushion (measurements PT)    Recommendations for Other Services       Precautions / Restrictions Precautions Precautions: Fall Recall of Precautions/Restrictions: Intact Restrictions Weight Bearing Restrictions Per Provider Order: No     Mobility  Bed Mobility Overal bed mobility: Needs Assistance Bed Mobility: Supine to Sit     Supine to sit: HOB elevated, Used rails, Contact guard       Patient Response: Cooperative  Transfers Overall transfer level: Needs assistance Equipment used: Rolling walker (2 wheels) Transfers: Sit to/from Stand Sit to Stand: Min assist                Ambulation/Gait Ambulation/Gait assistance: Min Chemical engineer  (Feet): 3 Feet Assistive device: Rolling walker (2 wheels) Gait Pattern/deviations: Step-to pattern Gait velocity: decreased     General Gait Details: poor quality steps to chair.  would have resulted in fall without assist   Stairs             Wheelchair Mobility     Tilt Bed Tilt Bed Patient Response: Cooperative  Modified Rankin (Stroke Patients Only)       Balance Overall balance assessment: Needs assistance Sitting-balance support: Feet supported Sitting balance-Leahy Scale: Fair     Standing balance support: Bilateral upper extremity supported Standing balance-Leahy Scale: Poor Standing balance comment: +1 assist needed to prevent fall                            Communication    Cognition Arousal: Alert Behavior During Therapy: WFL for tasks assessed/performed   PT - Cognitive impairments: No apparent impairments                                Cueing    Exercises      General Comments        Pertinent Vitals/Pain Pain Assessment Pain Assessment: Faces Faces Pain Scale: Hurts even more Pain Location: R arm and neck Pain Descriptors / Indicators: Aching, Sore Pain Intervention(s): Limited activity within patient's tolerance, Monitored during session, Repositioned    Home Living  Prior Function            PT Goals (current goals can now be found in the care plan section) Progress towards PT goals: Progressing toward goals    Frequency    Min 3X/week      PT Plan      Co-evaluation              AM-PAC PT "6 Clicks" Mobility   Outcome Measure  Help needed turning from your back to your side while in a flat bed without using bedrails?: A Little Help needed moving from lying on your back to sitting on the side of a flat bed without using bedrails?: A Little Help needed moving to and from a bed to a chair (including a wheelchair)?: A Lot Help needed standing up from  a chair using your arms (e.g., wheelchair or bedside chair)?: A Little Help needed to walk in hospital room?: A Lot Help needed climbing 3-5 steps with a railing? : Total 6 Click Score: 14    End of Session Equipment Utilized During Treatment: Gait belt;Oxygen Activity Tolerance: Patient tolerated treatment well;Patient limited by fatigue Patient left: in chair;with chair alarm set;with call bell/phone within reach Nurse Communication: Mobility status PT Visit Diagnosis: Muscle weakness (generalized) (M62.81);Unsteadiness on feet (R26.81)     Time: 4098-1191 PT Time Calculation (min) (ACUTE ONLY): 8 min  Charges:    $Therapeutic Activity: 8-22 mins PT General Charges $$ ACUTE PT VISIT: 1 Visit                   Charlanne Cong, PTA 12/13/23, 11:59 AM

## 2023-12-13 NOTE — Progress Notes (Signed)
 Chart review shows no bowel movement since 12/10/23 as below. Requested medication order from provider to encourage BM. Flowsheet Documentation Last filed value  Last BM Date  12/10/23   Stool Occurrence     Measured Stool

## 2023-12-13 NOTE — Plan of Care (Signed)

## 2023-12-13 NOTE — Plan of Care (Signed)
                                                     Palliative Care Progress Note   Patient Name: ALLEAH DEARMAN       Date: 12/13/2023 DOB: 23-Jul-1935  Age: 89 y.o. MRN#: [id number] Attending Physician: Melodi Sprung, DO Primary Care Physician: Monique Ano, MD Admit Date: 12/07/2023  Extensive chart review completed including labs, vital signs, imaging, progress notes, orders, and available advanced directive documents from current and previous encounters.   Patient now amenable to SNF.  TOC following closely for discharge planning.  No acute palliative needs identified today.  PMT will continue to follow and support patient and family throughout her hospitalization.  Thank you for allowing the Palliative Medicine Team to assist in the care of Amarise B [place].  Judeen Nose L. Rebbeca Campi, DNP, FNP-BC Palliative Medicine Team  No charge

## 2023-12-14 DIAGNOSIS — J9601 Acute respiratory failure with hypoxia: Secondary | ICD-10-CM | POA: Diagnosis not present

## 2023-12-14 DIAGNOSIS — I5033 Acute on chronic diastolic (congestive) heart failure: Secondary | ICD-10-CM | POA: Diagnosis not present

## 2023-12-14 DIAGNOSIS — I509 Heart failure, unspecified: Secondary | ICD-10-CM | POA: Diagnosis not present

## 2023-12-14 DIAGNOSIS — I480 Paroxysmal atrial fibrillation: Secondary | ICD-10-CM | POA: Diagnosis not present

## 2023-12-14 DIAGNOSIS — N3001 Acute cystitis with hematuria: Secondary | ICD-10-CM | POA: Diagnosis not present

## 2023-12-14 DIAGNOSIS — I482 Chronic atrial fibrillation, unspecified: Secondary | ICD-10-CM | POA: Diagnosis not present

## 2023-12-14 LAB — BASIC METABOLIC PANEL WITH GFR
Anion gap: 6 (ref 5–15)
BUN: 22 mg/dL (ref 8–23)
CO2: 35 mmol/L — ABNORMAL HIGH (ref 22–32)
Calcium: 9.2 mg/dL (ref 8.9–10.3)
Chloride: 92 mmol/L — ABNORMAL LOW (ref 98–111)
Creatinine, Ser: 0.81 mg/dL (ref 0.44–1.00)
GFR, Estimated: >60 mL/min (ref >60)
Glucose, Bld: 143 mg/dL — ABNORMAL HIGH (ref 70–99)
Potassium: 4.1 mmol/L (ref 3.5–5.1)
Sodium: 133 mmol/L — ABNORMAL LOW (ref 135–145)

## 2023-12-14 LAB — CBC
HCT: 40.2 % (ref 36.0–46.0)
Hemoglobin: 12.7 g/dL (ref 12.0–15.0)
MCH: 31.3 pg (ref 26.0–34.0)
MCHC: 31.6 g/dL (ref 30.0–36.0)
MCV: 99 fL (ref 80.0–100.0)
Platelets: 158 10*3/uL (ref 150–400)
RBC: 4.06 MIL/uL (ref 3.87–5.11)
RDW: 13 % (ref 11.5–15.5)
WBC: 7.1 10*3/uL (ref 4.0–10.5)
nRBC: 0 % (ref 0.0–0.2)

## 2023-12-14 LAB — PATHOLOGIST SMEAR REVIEW

## 2023-12-14 MED ORDER — METOPROLOL TARTRATE 5 MG/5ML IV SOLN: 5.0000 mg | Freq: Once | INTRAVENOUS | Status: DC

## 2023-12-14 MED ORDER — IRBESARTAN 150 MG PO TABS: 75.0000 mg | ORAL_TABLET | Freq: Every day | ORAL | Status: DC

## 2023-12-14 MED ORDER — MENTHOL 3 MG MT LOZG
1.0000 | LOZENGE | OROMUCOSAL | Status: DC | PRN
Start: 2023-12-14 — End: 2023-12-16
  Administered 2023-12-14 – 2023-12-15 (×2): 3 mg via ORAL
  Filled 2023-12-14: qty 9

## 2023-12-14 MED ORDER — DIGOXIN 125 MCG PO TABS
0.1250 mg | ORAL_TABLET | Freq: Every day | ORAL | Status: DC
  Administered 2023-12-14 – 2023-12-16 (×3): 0.125 mg via ORAL
  Filled 2023-12-14 (×3): qty 1

## 2023-12-14 NOTE — Plan of Care (Signed)

## 2023-12-14 NOTE — Progress Notes (Signed)
 [facility] CARDIOLOGY PROGRESS NOTE   [name] MRN: [id number] DOB/AGE: 04/20/1935 89 y.o.  Admit date: 12/07/2023 Referring Physician Dr. Melodi Sprung Primary Physician Monique Ano, MD  Primary Cardiologist Dr. Larinda Plover Reason for Consultation AF RVR, heart failure   HPI: Mary [name] is a 89 y.o. female  with a past medical history of coronary artery disease by CT scan, peripheral vascular disease s/p L CEA, poorly controlled HTN, hyperlipidemia, type 2 diabetes, pulmonary hypertension who presented to the ED on 12/07/2023 for SOB and worsening edema. Found to be in AF RVR. Cardiology was consulted for further evaluation.    Interval History:  -Reengaged by primary team for elevated HR. Patient seen this afternoon resting in hospital bed.  -HR 90-100s on tele at my evaluation, elevated earlier this AM. Patient reports palpitations earlier. Denies CP or SOB.  -Feeling fair today, with some weakness.   Review of systems complete and found to be negative unless listed above    Vitals:   12/14/23 0749 12/14/23 0750 12/14/23 1000 12/14/23 1134  BP: 135/63   135/70  Pulse: (!) 112 (!) 53 (!) 120 (!) 107  Resp: 20   18  Temp: 98.4 F (36.9 C)   98.2 F (36.8 C)  TempSrc:      SpO2: 92% 94%  94%  Weight:      Height:         Intake/Output Summary (Last 24 hours) at 12/14/2023 1151 Last data filed at 12/14/2023 1042 Gross per 24 hour  Intake 120 ml  Output 200 ml  Net -80 ml     PHYSICAL EXAM General: awake, well nourished, in no acute distress. HEENT: Normocephalic and atraumatic. Neck: No JVD.  Lungs: Normal respiratory effort. Clear bilaterally to auscultation. No wheezes, crackles, rhonchi.  Heart: HRRR. Normal S1 and S2 without gallops or murmurs. Radial & DP pulses 2+ bilaterally. Abdomen: Non-distended appearing.  Msk: Normal strength and tone for age. Extremities: No clubbing, cyanosis or edema.   Neuro: Alert and oriented X  3. Psych: Mood appropriate, affect congruent.    LABS: Basic Metabolic Panel: Recent Labs    12/12/23 0513 12/13/23 0529 12/14/23 0521  NA 138 137 133*  K 4.0 4.1 4.1  CL 94* 93* 92*  CO2 36* 36* 35*  GLUCOSE 116* 115* 143*  BUN 13 20 22   CREATININE 0.63 0.71 0.81  CALCIUM  9.0 9.4 9.2  MG 1.7 1.7  --   PHOS 2.5  --   --    Liver Function Tests: No results for input(s): "AST", "ALT", "ALKPHOS", "BILITOT", "PROT", "ALBUMIN" in the last 72 hours.  No results for input(s): "LIPASE", "AMYLASE" in the last 72 hours. CBC: Recent Labs    12/14/23 0521  WBC 7.1  HGB 12.7  HCT 40.2  MCV 99.0  PLT 158    Cardiac Enzymes: No results for input(s): "CKTOTAL", "CKMB", "CKMBINDEX", "TROPONINIHS" in the last 72 hours.  BNP: No results for input(s): "BNP" in the last 72 hours.  D-Dimer: No results for input(s): "DDIMER" in the last 72 hours. Hemoglobin A1C: No results for input(s): "HGBA1C" in the last 72 hours. Fasting Lipid Panel: No results for input(s): "CHOL", "HDL", "LDLCALC", "TRIG", "CHOLHDL", "LDLDIRECT" in the last 72 hours. Thyroid Function Tests: No results for input(s): "TSH", "T4TOTAL", "T3FREE", "THYROIDAB" in the last 72 hours.  Invalid input(s): "FREET3"  Anemia Panel: No results for input(s): "VITAMINB12", "FOLATE", "FERRITIN", "TIBC", "IRON ", "RETICCTPCT" in the last 72 hours.  No results found.  ECHO 12/08/2023 1. Left ventricular ejection fraction, by estimation, is 60 to 65%. The left ventricle has normal function. The left ventricle has no regional wall motion abnormalities. Left ventricular diastolic parameters are indeterminate.   2. Right ventricular systolic function is normal. The right ventricular size is normal.   3. Left atrial size was mildly dilated.   4. Large pleural effusion.   5. The mitral valve is normal in structure. Trivial mitral valve regurgitation. No evidence of mitral stenosis.   6. The aortic valve is normal in  structure. Aortic valve regurgitation is not visualized. No aortic stenosis is present.   7. The inferior vena cava is normal in size with greater than 50% respiratory variability, suggesting right atrial pressure of 3 mmHg.    TELEMETRY reviewed by me 12/08/2023: atrial fibrillation rate 90-100s   EKG reviewed by me: AF RVR rate 112 bpm   Data reviewed by me 12/08/2023: last 24h vitals tele labs imaging I/O hospitalist progress note.   ASSESSMENT AND PLAN:  Principal Problem:   CHF (congestive heart failure) (HCC) Active Problems:   Acute respiratory failure with hypoxia (HCC)   Acute on chronic diastolic CHF (congestive heart failure) (HCC)   A-fib (HCC)   Mary [name] is a 89 y.o. female  with a past medical history of coronary artery disease by CT scan, peripheral vascular disease s/p L CEA, poorly controlled HTN, hyperlipidemia, type 2 diabetes, pulmonary hypertension who presented to the ED on 12/07/2023 for SOB and worsening edema. Found to be in AF RVR. Cardiology was consulted for further evaluation.    # Atrial fibrillation RVR # Acute on chronic heart failure preserved EF, now euvolemic Patient reported to the ED with worsening shortness of breath and lower extremity edema with elevated BNP of 520. Atrial fibrillation RVR on initial EKG. Per tele patient still in atrial fibrillation with rates in 90s this AM. BP and HR remain stable. Patient states SOB and LE edema have improved. Patient appears euvolemic on exam. Echo this admission with EF 60-65%, no WMAs. -Continue PO Lasix  40 mg twice daily. Renal function is stable. -Start digoxin  0.125 mg daily for improved rate control. Continue Coreg  25 mg twice daily. -Continue irbesartan  75 mg daily. Hydral DC due to low BP.  -Continue Crestor  5 mg daily. -Continue eliquis  5 mg twice daily for stroke risk reduction.  -ASA discontinued. Continue Eliquis  5 mg BID, monitor for bleeding  This patient's plan of care was discussed and  created with Dr. Beau Bound and he is in agreement.    Signed: Hamp Levine, PA-C 12/14/2023, 11:51 AM [facility] Cardiology

## 2023-12-14 NOTE — Progress Notes (Signed)
 PROGRESS NOTE  Mary [name]    DOB: 12/22/1934, 89 y.o.  BJY:[id number]    Code Status: Limited: Do not attempt resuscitation (DNR) -DNR-LIMITED -Do Not Intubate/DNI    DOA: 12/07/2023   LOS: 7   Brief hospital course  Mary [name] is a 89 y.o. female with medical history significant of HTN, chronic HFpEF, CAD, hypothyroidism, recurrent nosebleed off dual antiplatelet therapy, question of PAF, presented from home with worsening of cough shortness of breath and peripheral edema. Onset 7 days prior. Came to ED when noting SOB at rest.     04/16: to ED. CXR pulmonary congestion and bilateral pleural effusion, elevated HR and Afib RVR on EKG. Admitted to hospitalist service for acute on chronic HFpEF and Afib RVR. IV lasix , digoxin  loading IV also ordered 04/17: HR remains labile, occasionally sustaining 130s-140s. Increase beta blocker. Echo results pending. Cardiology consulted re: continuation digoxin  vs other, HR responding to beta blocker so will not continue digoxin  04/18: thoracentesis removed 150 mL. HR still up a bit, monitoring  04/19: transition to po lasix , coreg  increased again today and HR is improved. Restarted Eliquis , dc ASA per cardiology. 04/20: improving on increased beta blocker, cardiology states ok to dc from their standpoint, likely for dc tomorrow once we can arrange DME/HH  04/21: pt still weak and worse compared to yesterday, she is now amenable to SNF rehab, TOC informed  04/22: placement in process. Reduced dose BP meds given some dizziness / soft BP but HR has been at goal.  12/14/23 -afib RVR again today, not fully controlled on coreg  alone. Cards reconsulted. Restarted digoxin .   Assessment & Plan  Principal Problem:   CHF (congestive heart failure) (HCC) Active Problems:   Acute respiratory failure with hypoxia (HCC)   Acute on chronic diastolic CHF (congestive heart failure) (HCC)   A-fib (HCC)  Acute hypoxic respiratory failure- currently on 2L Due to  pulmonary edema and pleural effusion from CHF O2 supplement, wean as able Treat underlying cause(s)   L Pleural effusion - noted large on Echo, moderate on CXR Thoracentesis 04/18 removed 150 mL L chest    Acute on chronic HFpEF decompensation Essential HTN Orthostatic symptoms Question secondary to A-fib with RVR or may be primary/initial problem Echocardiogram --> EF 60-65%, no RWMA, indeterminate diastolic parameters but suspect some diastolic dysfunction given clinical CHF  Furosemide  40 mg po bid  Coreg  25 mg bid Irbesartan  reduced to 75 mg daily d/t soft BP Hydralazine  discontinued Aspirin  81 mg daily d/c, restart Eliquis  5 mg bid per cardiology  Crestor  5 mg daily. Strict I&O's   PAF with RVR Coreg  12.5 mg twice daily --> 25 mg bid is controlling rate   Cardiology restarting digoxin . Follow potassium, magnesium  and phosphorus levels and correct electrolytes as needed CHADS2VASC = 7, indication for systemic anticoagulation.  However patient reported that she has had repeated severe nosebleed recently and antiplatelet therapy of dual antiplatelet aspirin  Plavix  have been held for 2 months. Aspirin  81 mg daily d/c and restart Eliquis  5 mg bid per cardiology     SIRS assoc w/ acute resp fail, Afib, CHF - resolved  Treat as above No leukocytosis/infection, no sepsis    Abnormal UA  Asymptomatic bacteriuria  UCx no growth VS more c/w SIRS/Afib/CHF rather than sepsis, denies urinary symptoms, will not treat Follow UCx --> no growth    Hypokalemia Replace as needed  Monitor BMP   Hypothyroidism TSH WNL, not likely to be contributing to cardiac issues as above  Continue Synthroid    Anxiety/depression Continue Prozac and trazodone  at bedtime   IIDM- A1c 6.1 Continue diet control   Deconditioning PT evaluation - pt very weak and likely inadequate reserve, she is now amenable to SNF rehab Placement in process   Body mass index is 23.77 kg/m.  VTE ppx:  apixaban   (ELIQUIS ) tablet 5 mg   Diet:     Diet   Diet Heart Room service appropriate? Yes with Assist; Fluid consistency: Thin   Consultants: Cardiology   Subjective 12/14/23    Pt reports feeling weak today. Denies palpitations or flutter.    Objective   Vitals:   12/14/23 0345 12/14/23 0500 12/14/23 0749 12/14/23 0750  BP: 116/64  135/63   Pulse: 85  (!) 112 (!) 53  Resp: 16  20   Temp: 98.1 F (36.7 C)  98.4 F (36.9 C)   TempSrc: Oral     SpO2: 96%  92% 94%  Weight:  66.8 kg    Height:        Intake/Output Summary (Last 24 hours) at 12/14/2023 0758 Last data filed at 12/13/2023 2047 Gross per 24 hour  Intake 180 ml  Output 200 ml  Net -20 ml   Filed Weights   12/12/23 0500 12/13/23 0500 12/14/23 0500  Weight: 68.6 kg 69.5 kg 66.8 kg     Physical Exam:  General: awake, alert, NAD Respiratory: normal respiratory effort. Cardiovascular: quick capillary refill, irregular rhythm, rapid.  Nervous: A&O x3. no gross focal neurologic deficits, normal speech Extremities: moves all equally, no edema, normal tone Skin: dry, intact, normal temperature, normal color. No rashes, lesions or ulcers on exposed skin Psychiatry: normal mood, congruent affect  Labs   I have personally reviewed the following labs and imaging studies CBC    Component Value Date/Time   WBC 7.1 12/14/2023 0521   RBC 4.06 12/14/2023 0521   HGB 12.7 12/14/2023 0521   HGB 12.1 12/30/2013 0554   HCT 40.2 12/14/2023 0521   HCT 37.4 12/30/2013 0554   PLT 158 12/14/2023 0521   PLT 129 (L) 12/30/2013 0554   MCV 99.0 12/14/2023 0521   MCV 92 12/30/2013 0554   MCH 31.3 12/14/2023 0521   MCHC 31.6 12/14/2023 0521   RDW 13.0 12/14/2023 0521   RDW 15.1 (H) 12/30/2013 0554   LYMPHSABS 1.2 12/07/2023 1242   LYMPHSABS 1.4 12/30/2013 0554   MONOABS 0.5 12/07/2023 1242   MONOABS 0.4 12/30/2013 0554   EOSABS 0.1 12/07/2023 1242   EOSABS 0.3 12/30/2013 0554   BASOSABS 0.0 12/07/2023 1242   BASOSABS 0.0  12/30/2013 0554      Latest Ref Rng & Units 12/14/2023    5:21 AM 12/13/2023    5:29 AM 12/12/2023    5:13 AM  BMP  Glucose 70 - 99 mg/dL 161  096  045   BUN 8 - 23 mg/dL 22  20  13    Creatinine 0.44 - 1.00 mg/dL 4.09  8.11  9.14   Sodium 135 - 145 mmol/L 133  137  138   Potassium 3.5 - 5.1 mmol/L 4.1  4.1  4.0   Chloride 98 - 111 mmol/L 92  93  94   CO2 22 - 32 mmol/L 35  36  36   Calcium  8.9 - 10.3 mg/dL 9.2  9.4  9.0     No results found.  Disposition Plan & Communication  Patient status: Inpatient  Admitted From: Home Planned disposition location: Skilled nursing facility Anticipated discharge date: 4/25  pending clinical improvement   Family Communication: none at bedside.     Author: Ree Candy, DO [facility] 12/14/2023, 7:58 AM   Available by Epic secure chat 7AM-7PM. If 7PM-7AM, please contact night-coverage.  [facility] contact information found on [email or web address].

## 2023-12-14 NOTE — TOC Progression Note (Signed)
 Transition of Care [facility]) - Progression Note    Patient Details  Name: Mary [name] MRN: [id number] Date of Birth: 1935/02/01  Transition of Care [facility]) CM/SW Contact  Baird Bombard, RN Phone Number: 12/14/2023, 3:42 PM  Clinical Narrative:    Spoke with patient at bedside to give bed offers. Patient given bed offers for [facility], [facility], [facility] and [facility]. Patient is agreeable to [facility]. She was advised Siegfried Dress is required for admission.           Expected Discharge Plan and Services                                               Social Determinants of Health (SDOH) Interventions SDOH Screenings   Food Insecurity: No Food Insecurity (12/07/2023)  Housing: Low Risk  (12/07/2023)  Transportation Needs: No Transportation Needs (12/07/2023)  Utilities: Not At Risk (12/07/2023)  Financial Resource Strain: Low Risk  (09/28/2023)   Received from [facility]  Social Connections: Moderately Isolated (12/07/2023)  Tobacco Use: Low Risk  (12/08/2023)    Readmission Risk Interventions     No data to display

## 2023-12-14 NOTE — Progress Notes (Signed)
 Occupational Therapy Treatment Patient Details Name: Giuliana B Kotecki MRN: [id number] DOB: [birth year] Today's Date: 12/14/2023   History of present illness Pt is an 89 y/o F admitted on 12/07/23 after presenting with worsening cough, SOB & peripheral edema. Pt found to have B pleural effusion, elevated HR, & a-fib with RVR. Pt is being treated for acute on chronic HFpEF & a-fib with RVR. PMH: HTN, chronic HFpEF, CAD, hypothyroidism, recurrent nosebleed, question of PAF, asthma, c diff infection, lumbar DDD, depression, DM2, diabetic retinopathy, heart murmur, HLD, RA, sleep apnea, spinal stenosis   OT comments  Chart reviewed to date, pt greeted in bed, alert and oriented x4, however increased cueing required for problem solving on this date with ?safety awareness deficits noted. Will continue to assess. Tx session targeted improving functional activity tolerance in preparation for ADL tasks.Pt reports she continues to feel weak and she continues to require MIN A for STS, MIN A for short amb transfer to bedside chair. Pt unable to tolerate further mobility due to fatigue. SET UP required for feeding tasks. Vitals monitored in supine/sit/stand,and pt is not orthostatic at this time. MD in room during bed mobility. Pt is left in bedside chair, all needs met, safety maintained. OT will continue to follow.      If plan is discharge home, recommend the following:  A lot of help with walking and/or transfers;A lot of help with bathing/dressing/bathroom;Help with stairs or ramp for entrance   Equipment Recommendations  BSC/3in1;Wheelchair (measurements OT);Wheelchair cushion (measurements OT)    Recommendations for Other Services      Precautions / Restrictions Precautions Precautions: Fall Recall of Precautions/Restrictions: Impaired       Mobility Bed Mobility Overal bed mobility: Needs Assistance Bed Mobility: Supine to Sit     Supine to sit: Min assist, HOB elevated, Used rails           Transfers Overall transfer level: Needs assistance Equipment used: Rolling walker (2 wheels) Transfers: Sit to/from Stand Sit to Stand: Min assist                 Balance Overall balance assessment: Needs assistance Sitting-balance support: Feet supported Sitting balance-Leahy Scale: Fair     Standing balance support: Bilateral upper extremity supported, Reliant on assistive device for balance Standing balance-Leahy Scale: Poor                             ADL either performed or assessed with clinical judgement   ADL Overall ADL's : Needs assistance/impaired Eating/Feeding: Set up;Sitting   Grooming: Sitting;Set up               Lower Body Dressing: Maximal assistance   Toilet Transfer: Minimal assistance;Rolling walker (2 wheels) Toilet Transfer Details (indicate cue type and reason): simulated, step pivot to bedside chair, pt with poor problem solving, attempting to sit before being appropriately positioned at the chair                Extremity/Trunk Assessment              Vision       Perception     Praxis     Communication Communication Communication: Impaired Factors Affecting Communication: Hearing impaired   Cognition Arousal: Alert Behavior During Therapy: Flat affect Cognition: Cognition impaired         Attention impairment (select first level of impairment): Selective attention Executive functioning impairment (select all impairments): Problem solving OT -  Cognition Comments: decreased ability to problem solve on this date, will continue to assess                 Following commands: Impaired Following commands impaired: Follows one step commands with increased time      Cueing   Cueing Techniques: Verbal cues  Exercises Other Exercises Other Exercises: edu re: importance of progressing mobility, safe ADL completion    Shoulder Instructions       General Comments Max HR 136 bpm with STS     Pertinent Vitals/ Pain       Pain Assessment Pain Assessment: 0-10 Pain Score: 3  Pain Location: neck, posterior head Pain Intervention(s): Limited activity within patient's tolerance, Monitored during session, Repositioned (MD in room)  Home Living                                          Prior Functioning/Environment              Frequency  Min 3X/week        Progress Toward Goals  OT Goals(current goals can now be found in the care plan section)  Progress towards OT goals: Progressing toward goals  Acute Rehab OT Goals Time For Goal Achievement: 12/28/23  Plan      Co-evaluation                 AM-PAC OT "6 Clicks" Daily Activity     Outcome Measure   Help from another person eating meals?: None Help from another person taking care of personal grooming?: None Help from another person toileting, which includes using toliet, bedpan, or urinal?: A Lot Help from another person bathing (including washing, rinsing, drying)?: A Lot Help from another person to put on and taking off regular upper body clothing?: A Little Help from another person to put on and taking off regular lower body clothing?: A Lot 6 Click Score: 17    End of Session Equipment Utilized During Treatment: Rolling walker (2 wheels)  OT Visit Diagnosis: Other abnormalities of gait and mobility (R26.89);Muscle weakness (generalized) (M62.81)   Activity Tolerance Patient tolerated treatment well   Patient Left in bed;with bed alarm set;with call bell/phone within reach   Nurse Communication Mobility status;Other (comment) (MD in room during session)        Time: [phone] OT Time Calculation (min): 20 min  Charges: OT General Charges $OT Visit: 1 Visit OT Treatments $Therapeutic Activity: 8-22 mins Gerre Kraft, OTD OTR/L  12/14/23, 11:17 AM

## 2023-12-14 NOTE — Progress Notes (Addendum)
 Palliative Care Progress Note, Assessment & Plan   Patient Name: Mary [name]       Date: 12/14/2023 DOB: [birth year]  Age: 89 y.o. MRN#: [id number] Attending Physician: Ree Candy, MD Primary Care Physician: Monique Ano, MD Admit Date: 12/07/2023  Subjective: Patient is out of bed and sitting in her recliner with nasal cannula at 2 L in place.  She acknowledges my presence, and is able to make her wishes known, and is alert and oriented x 4.  No family or friends present during my visit.  HPI: 89 y.o. female  with past medical history of HTN, chronic HFpEF, CAD, hypothyroidism, recurrent nosebleed off dual antiplatelet therapy, question of PAF. She presented to ED after 7 days worsening SHOB and peripheral edema.    ED workup found pt to be febrile with heart rate 130-150, blood pressure elevated SBP 140-150s.  Chest x-ray showed pulmonary congestion and small bilateral pleural effusions.  Blood work showed K 4.0, creatinine 0.7 WBC 6.1, hemoglobin 13.5. UA showed 2+ WBC.   Patient was given IV Lasix  x 1.  IR 1000 mL, Levaquin  x 1.   She was admitted for management of CHF, acute respiratory failure with hypoxia, acute on chronic diastolic heart failure and A-fib with RVR.    Palliative care consulted for goals of care discussion.   Summary of counseling/coordination of care: Extensive chart review completed prior to meeting patient including labs, vital signs, imaging, progress notes, orders, and available advanced directive documents from current and previous encounters.   After reviewing the patient's chart and assessing the patient at bedside, I spoke with patient in regards to symptom management and goals of care.   Symptoms assessed.  Patient has no acute complaints.  She denies  headache, chest pain, or other acute issues.  No adjustment to [facility] needed at this time.  Discussed next steps.  Patient remains in agreement to discharge to SNF for rehab.  She shares concerns that she is only going to be there for a short amount of time in hopes that she does not have to have any HH once she returns home.  However, she shares she believes she will need more help.  Discussed maximizing therapies at rehab and allowing time for outcomes.  Therapeutic silence, active listening, and emotional support provided.  In light of patient likely transferring to a separate medical facility, I introduced the concept of a MOST form.  Blank MOST form provided. Education provided on MOST form and questions and concerns were addressed.    Patient shares she is interested in filling when out for herself as well as for her daughter.  Discussed that form can be completed in the hospital with any provider or can be completed with any DO, PA, NP, or MD.  She was appreciative of our discussion and shares she will review the MOST form.  I offered to return bedside to discuss MOST form should she have further questions.  Additionally, patient confirmed that her next of kin decision maker in the event that she is unable to speak for herself would be her sister.  No adjustment to plan of care at this time.  PMT will continue to follow and  support patient throughout her hospitalization.  Physical Exam Vitals reviewed.  Constitutional:      General: She is not in acute distress.    Appearance: She is normal weight.  HENT:     Head: Normocephalic.  Cardiovascular:     Rate and Rhythm: Normal rate.  Pulmonary:     Effort: Pulmonary effort is normal.  Musculoskeletal:     Comments: MAETC, generalized weakness  Skin:    General: Skin is warm and dry.  Neurological:     Mental Status: She is alert and oriented to person, place, and time.  Psychiatric:        Mood and Affect: Mood normal. Mood is not  anxious.        Behavior: Behavior normal. Behavior is not agitated.             Total Time 35 minutes   Time spent includes: Detailed review of medical records (labs, imaging, vital signs), medically appropriate exam (mental status, respiratory, cardiac, skin), discussed with treatment team, counseling and educating patient, family and staff, documenting clinical information, medication management and coordination of care.  Judeen Nose L. Rebbeca Campi, DNP, FNP-BC Palliative Medicine Team

## 2023-12-14 NOTE — Progress Notes (Addendum)
 Pt is complaining of dry throat and is requesting lozenges. NP Boston Byers made aware.  Update 2022: See new orders.

## 2023-12-14 NOTE — Progress Notes (Signed)
 Physical Therapy Treatment Patient Details Name: Mary [name] MRN: [id number] DOB: [birth year] Today's Date: 12/14/2023   History of Present Illness Pt is an 89 y/o F admitted on 12/07/23 after presenting with worsening cough, SOB & peripheral edema. Pt found to have B pleural effusion, elevated HR, & a-fib with RVR. Pt is being treated for acute on chronic HFpEF & a-fib with RVR. PMH: HTN, chronic HFpEF, CAD, hypothyroidism, recurrent nosebleed, question of PAF, asthma, c diff infection, lumbar DDD, depression, DM2, diabetic retinopathy, heart murmur, HLD, RA, sleep apnea, spinal stenosis    PT Comments  Pt was long sitting in bed, attempting to make phone call upon arrival. She is alert and cooperative however cognition did not seem as sharpe as previously observed by author ~ 5 days prior. Pt is extremely pleasant and motivated. She was fully willing to get OOB and attempt ambulation however pre-syncopal with change in position. Pt was easily able to achieve EOB sitting however HR dropped from 120s down to 32 bpm. Pt becomes slightly unresponsive but with total assist returning to bed, cognition quickly improves with pt returning to pre session cognitive status. RN/MD/OT made aware of concerns with mobility. Acute PT will continue to follow and progress as able per current POC. Dc recs remain appropriate due to pt being so far from her baseline abilities.     If plan is discharge home, recommend the following: A little help with walking and/or transfers;A little help with bathing/dressing/bathroom;Assistance with cooking/housework;Assist for transportation;Help with stairs or ramp for entrance     Equipment Recommendations  BSC/3in1;Wheelchair (measurements PT);Wheelchair cushion (measurements PT)       Precautions / Restrictions Precautions Precautions: Fall Recall of Precautions/Restrictions: Impaired Restrictions Weight Bearing Restrictions Per Provider Order: No     Mobility  Bed  Mobility Overal bed mobility: Needs Assistance Bed Mobility: Supine to Sit, Sit to Supine  Supine to sit: Supervision Sit to supine: Supervision   Transfers  General transfer comment: Unable due to vitals response to just sitting up.      Balance Overall balance assessment: Needs assistance Sitting-balance support: Feet supported Sitting balance-Leahy Scale: Poor Sitting balance - Comments: pre-syncopal event       Communication Communication Communication: Impaired  Cognition Arousal: Alert Behavior During Therapy: WFL for tasks assessed/performed   PT - Cognitive impairments:  (pt did seem to have different cognition presentation from last session observed by author.)    PT - Cognition Comments: Pt is alert however did not remember author from earlier this admission. she was on o2 throughout very limited session. Pt endorses severe posterior neck/head pain. RN made aware. Following commands: Impaired Following commands impaired: Follows one step commands with increased time    Cueing Cueing Techniques: Verbal cues     General Comments General comments (skin integrity, edema, etc.): HR at rest between 112-130 bpm that dropped as low as 32 bpm  in sitting      Pertinent Vitals/Pain Pain Assessment Pain Assessment: 0-10 Pain Score: 7  Pain Location: back on my head hurts Pain Descriptors / Indicators: Discomfort, Shooting Pain Intervention(s): Limited activity within patient's tolerance, Monitored during session, Premedicated before session, Repositioned     PT Goals (current goals can now be found in the care plan section) Acute Rehab PT Goals Patient Stated Goal: go home Progress towards PT goals: Progressing toward goals    Frequency    Min 3X/week       AM-PAC PT "6 Clicks" Mobility   Outcome Measure  Help needed turning from your back to your side while in a flat bed without using bedrails?: A Little Help needed moving from lying on your back to  sitting on the side of a flat bed without using bedrails?: A Little Help needed moving to and from a bed to a chair (including a wheelchair)?: A Lot Help needed standing up from a chair using your arms (e.g., wheelchair or bedside chair)?: A Lot Help needed to walk in hospital room?: A Lot Help needed climbing 3-5 steps with a railing? : A Lot 6 Click Score: 14    End of Session Equipment Utilized During Treatment: Oxygen Activity Tolerance: Patient tolerated treatment well;Treatment limited secondary to agitation Patient left: in chair;with chair alarm set;with call bell/phone within reach Nurse Communication: Mobility status PT Visit Diagnosis: Muscle weakness (generalized) (M62.81);Unsteadiness on feet (R26.81)     Time: 1610-9604 PT Time Calculation (min) (ACUTE ONLY): 13 min  Charges:    $Therapeutic Activity: 8-22 mins PT General Charges $$ ACUTE PT VISIT: 1 Visit                     Mary [name] PTA 12/14/23, 3:53 PM

## 2023-12-15 DIAGNOSIS — I482 Chronic atrial fibrillation, unspecified: Secondary | ICD-10-CM | POA: Diagnosis not present

## 2023-12-15 DIAGNOSIS — I5033 Acute on chronic diastolic (congestive) heart failure: Secondary | ICD-10-CM | POA: Diagnosis not present

## 2023-12-15 LAB — BASIC METABOLIC PANEL WITH GFR
Anion gap: 11 (ref 5–15)
BUN: 17 mg/dL (ref 8–23)
CO2: 39 mmol/L — ABNORMAL HIGH (ref 22–32)
Calcium: 9.8 mg/dL (ref 8.9–10.3)
Chloride: 91 mmol/L — ABNORMAL LOW (ref 98–111)
Creatinine, Ser: 0.65 mg/dL (ref 0.44–1.00)
GFR, Estimated: >60 mL/min (ref >60)
Glucose, Bld: 113 mg/dL — ABNORMAL HIGH (ref 70–99)
Potassium: 4.2 mmol/L (ref 3.5–5.1)
Sodium: 140 mmol/L (ref 135–145)

## 2023-12-15 MED ORDER — IRBESARTAN 150 MG PO TABS
150.0000 mg | ORAL_TABLET | Freq: Every day | ORAL | Status: DC
  Administered 2023-12-15: 150 mg via ORAL
  Filled 2023-12-15: qty 1

## 2023-12-15 MED ORDER — LORAZEPAM 0.5 MG PO TABS
0.5000 mg | ORAL_TABLET | Freq: Two times a day (BID) | ORAL | Status: DC | PRN
  Administered 2023-12-15: 0.5 mg via ORAL
  Filled 2023-12-15: qty 1

## 2023-12-15 NOTE — Plan of Care (Signed)
  Problem: Clinical Measurements: Goal: Ability to maintain clinical measurements within normal limits will improve Outcome: Progressing   Problem: Clinical Measurements: Goal: Respiratory complications will improve Outcome: Progressing   Problem: Clinical Measurements: Goal: Cardiovascular complication will be avoided Outcome: Progressing   Problem: Activity: Goal: Risk for activity intolerance will decrease Outcome: Progressing   Problem: Elimination: Goal: Will not experience complications related to urinary retention Outcome: Progressing   Problem: Pain Managment: Goal: General experience of comfort will improve and/or be controlled Outcome: Progressing   Problem: Safety: Goal: Ability to remain free from injury will improve Outcome: Progressing

## 2023-12-15 NOTE — Progress Notes (Signed)
 Palliative Care Progress Note, Assessment & Plan   Patient Name: Mary [name]       Date: 12/15/2023 DOB: 02-11-1935  Age: 89 y.o. MRN#: [id number] Attending Physician: Ree Candy, MD Primary Care Physician: Monique Ano, MD Admit Date: 12/07/2023  Subjective: Patient is lying in bed with her feet dangling over the edge.  She shares that she has extreme pain when she sits up unassisted.  Readjusted patient in bed, and set her up to eat her breakfast tray.    HPI: 89 y.o. female  with past medical history of HTN, chronic HFpEF, CAD, hypothyroidism, recurrent nosebleed off dual antiplatelet therapy, question of PAF. She presented to ED after 7 days worsening SHOB and peripheral edema.    ED workup found pt to be febrile with heart rate 130-150, blood pressure elevated SBP 140-150s.  Chest x-ray showed pulmonary congestion and small bilateral pleural effusions.  Blood work showed K 4.0, creatinine 0.7 WBC 6.1, hemoglobin 13.5. UA showed 2+ WBC.   Patient was given IV Lasix  x 1.  IR 1000 mL, Levaquin  x 1.   She was admitted for management of CHF, acute respiratory failure with hypoxia, acute on chronic diastolic heart failure and A-fib with RVR.    Palliative care consulted for goals of care discussion.   Summary of counseling/coordination of care: Extensive chart review completed prior to meeting patient including labs, vital signs, imaging, progress notes, orders, and available advanced directive documents from current and previous encounters.   After reviewing the patient's chart and assessing the patient at bedside, I spoke with patient in regards to symptom management and goals of care.   I attempted to discuss her symptoms but patient shared she would like to be left alone at this  time.  Therapeutic silence and active listening offered.  Patient shares that she feels fine and would just like to eat her breakfast alone.  I offered to visit at a later time and patient declined.  Will revisit when appropriate and patient is open/willing to interact with PMT.  RN notified that patient is at edge of bed and the bed alarm has been adjusted appropriately.  No change to plan of care at this time.  Physical Exam Vitals reviewed.  Constitutional:      General: She is not in acute distress.    Appearance: She is normal weight. She is not ill-appearing.  HENT:     Head: Normocephalic.  Pulmonary:     Effort: Pulmonary effort is normal.  Musculoskeletal:     Comments: MAETC, generalized weakness  Skin:    General: Skin is warm and dry.  Neurological:     Mental Status: She is alert and oriented to person, place, and time.  Psychiatric:        Mood and Affect: Mood normal. Mood is not anxious.        Behavior: Behavior normal. Behavior is not agitated.             Total Time 25 minutes   Time spent includes: Detailed review of medical records (labs, imaging, vital signs), medically appropriate exam (mental status, respiratory, cardiac, skin), discussed with treatment team, counseling and educating patient, family and staff,  documenting clinical information, medication management and coordination of care.  Judeen Nose L. Rebbeca Campi, DNP, FNP-BC Palliative Medicine Team

## 2023-12-15 NOTE — Progress Notes (Signed)
 [facility] CLINIC CARDIOLOGY PROGRESS NOTE   [name] MRN: [id number] DOB/AGE: Jun 16, 1935 89 y.o.  Admit date: 12/07/2023 Referring Physician Dr. Melodi Sprung Primary Physician Monique Ano, MD  Primary Cardiologist Dr. Larinda Plover Reason for Consultation AF RVR, heart failure   HPI: Mary [name] is a 89 y.o. female  with a past medical history of coronary artery disease by CT scan, peripheral vascular disease s/p L CEA, poorly controlled HTN, hyperlipidemia, type 2 diabetes, pulmonary hypertension who presented to the ED on 12/07/2023 for SOB and worsening edema. Found to be in AF RVR. Cardiology was consulted for further evaluation.    Interval History:  -Patient seen and examined this AM, reports feeling better.  -HR 100s on tele, controlled overnight. Has not received AM meds yet.  -BP elevated.   Review of systems complete and found to be negative unless listed above    Vitals:   12/14/23 2326 12/15/23 0325 12/15/23 0500 12/15/23 0829  BP: (!) 149/83 (!) 157/99  (!) 165/85  Pulse: 86 85  (!) 138  Resp: 18 18  18   Temp: 97.7 F (36.5 C) 97.6 F (36.4 C)  98 F (36.7 C)  TempSrc:    Oral  SpO2: 97% 95%  97%  Weight:   66.4 kg   Height:         Intake/Output Summary (Last 24 hours) at 12/15/2023 0853 Last data filed at 12/15/2023 0548 Gross per 24 hour  Intake 360 ml  Output 1300 ml  Net -940 ml     PHYSICAL EXAM General: awake, well nourished, in no acute distress. HEENT: Normocephalic and atraumatic. Neck: No JVD.  Lungs: Normal respiratory effort. Clear bilaterally to auscultation. No wheezes, crackles, rhonchi.  Heart: Irregularly irregular. Normal S1 and S2 without gallops or murmurs. Radial & DP pulses 2+ bilaterally. Abdomen: Non-distended appearing.  Msk: Normal strength and tone for age. Extremities: No clubbing, cyanosis or edema.   Neuro: Alert and oriented X 3. Psych: Mood appropriate, affect congruent.    LABS: Basic  Metabolic Panel: Recent Labs    12/13/23 0529 12/14/23 0521 12/15/23 0331  NA 137 133* 140  K 4.1 4.1 4.2  CL 93* 92* 91*  CO2 36* 35* 39*  GLUCOSE 115* 143* 113*  BUN 20 22 17   CREATININE 0.71 0.81 0.65  CALCIUM  9.4 9.2 9.8  MG 1.7  --   --    Liver Function Tests: No results for input(s): "AST", "ALT", "ALKPHOS", "BILITOT", "PROT", "ALBUMIN" in the last 72 hours.  No results for input(s): "LIPASE", "AMYLASE" in the last 72 hours. CBC: Recent Labs    12/14/23 0521  WBC 7.1  HGB 12.7  HCT 40.2  MCV 99.0  PLT 158    Cardiac Enzymes: No results for input(s): "CKTOTAL", "CKMB", "CKMBINDEX", "TROPONINIHS" in the last 72 hours.  BNP: No results for input(s): "BNP" in the last 72 hours.  D-Dimer: No results for input(s): "DDIMER" in the last 72 hours. Hemoglobin A1C: No results for input(s): "HGBA1C" in the last 72 hours. Fasting Lipid Panel: No results for input(s): "CHOL", "HDL", "LDLCALC", "TRIG", "CHOLHDL", "LDLDIRECT" in the last 72 hours. Thyroid Function Tests: No results for input(s): "TSH", "T4TOTAL", "T3FREE", "THYROIDAB" in the last 72 hours.  Invalid input(s): "FREET3"  Anemia Panel: No results for input(s): "VITAMINB12", "FOLATE", "FERRITIN", "TIBC", "IRON ", "RETICCTPCT" in the last 72 hours.  No results found.    ECHO 12/08/2023 1. Left ventricular ejection fraction, by estimation, is 60 to 65%. The left ventricle  has normal function. The left ventricle has no regional wall motion abnormalities. Left ventricular diastolic parameters are indeterminate.   2. Right ventricular systolic function is normal. The right ventricular size is normal.   3. Left atrial size was mildly dilated.   4. Large pleural effusion.   5. The mitral valve is normal in structure. Trivial mitral valve regurgitation. No evidence of mitral stenosis.   6. The aortic valve is normal in structure. Aortic valve regurgitation is not visualized. No aortic stenosis is present.   7.  The inferior vena cava is normal in size with greater than 50% respiratory variability, suggesting right atrial pressure of 3 mmHg.    TELEMETRY reviewed by me 12/08/2023: atrial fibrillation rate 100s   EKG reviewed by me: AF RVR rate 112 bpm   Data reviewed by me 12/08/2023: last 24h vitals tele labs imaging I/O hospitalist progress note.   ASSESSMENT AND PLAN:  Principal Problem:   CHF (congestive heart failure) (HCC) Active Problems:   Acute respiratory failure with hypoxia (HCC)   Acute on chronic diastolic CHF (congestive heart failure) (HCC)   A-fib (HCC)   Mary [name] is a 89 y.o. female  with a past medical history of coronary artery disease by CT scan, peripheral vascular disease s/p L CEA, poorly controlled HTN, hyperlipidemia, type 2 diabetes, pulmonary hypertension who presented to the ED on 12/07/2023 for SOB and worsening edema. Found to be in AF RVR. Cardiology was consulted for further evaluation.    # Atrial fibrillation RVR # Acute on chronic heart failure preserved EF, now euvolemic Patient reported to the ED with worsening shortness of breath and lower extremity edema with elevated BNP of 520. Atrial fibrillation RVR on initial EKG. Per tele patient still in atrial fibrillation with rates in 90s this AM. BP and HR remain stable. Patient states SOB and LE edema have improved. Patient appears euvolemic on exam. Echo this admission with EF 60-65%, no WMAs. -Continue PO Lasix  40 mg twice daily. Renal function is stable. -Continue digoxin  0.125 mg daily for improved rate control. Continue Coreg  25 mg twice daily. -Increase irbesartan  to 150 mg daily. Hydral was DC due to low BP.  -Continue Crestor  5 mg daily. -Continue eliquis  5 mg twice daily for stroke risk reduction.  -ASA discontinued. Continue Eliquis  5 mg BID, monitor for bleeding  This patient's plan of care was discussed and created with Dr. Beau Bound and he is in agreement.    Signed: Hamp Levine,  PA-C 12/15/2023, 8:53 AM [facility]

## 2023-12-15 NOTE — TOC Progression Note (Addendum)
 Transition of Care [facility]) - Progression Note    Patient Details  Name: Mary [name] MRN: [id number] Date of Birth: 03-30-1935  Transition of Care [facility]) CM/SW Contact  Baird Bombard, RN Phone Number: 12/15/2023, 9:32 AM  Clinical Narrative:    Cesar Collins started by [facility] assistant.   4:10pm Per TOC assistant, patient was approved for [facility].  PlanAuthID:[id number] Dates:4/24-4/28/25 next review date: 12/19/2023  Spoke with Tammy in admissions at [facility]. Patient can admit tomorrow. MD notified.          Expected Discharge Plan and Services                                               Social Determinants of Health (SDOH) Interventions SDOH Screenings   Food Insecurity: No Food Insecurity (12/07/2023)  Housing: Low Risk  (12/07/2023)  Transportation Needs: No Transportation Needs (12/07/2023)  Utilities: Not At Risk (12/07/2023)  Financial Resource Strain: Low Risk  (09/28/2023)   Received from [facility]  Social Connections: Moderately Isolated (12/07/2023)  Tobacco Use: Low Risk  (12/08/2023)    Readmission Risk Interventions     No data to display

## 2023-12-15 NOTE — Progress Notes (Signed)
 Patient c/o breakfast not being good. Offered to get pt something different. Pt refused. Pt c/o pain in her neck and was crying and when asked what was wrong she stated "family stuff". Pt took her medications and MD notified of above.

## 2023-12-15 NOTE — Progress Notes (Signed)
 PROGRESS NOTE  Mary [name]    DOB: 05-19-1935, 89 y.o.  UJW:[id number]    Code Status: Limited: Do not attempt resuscitation (DNR) -DNR-LIMITED -Do Not Intubate/DNI    DOA: 12/07/2023   LOS: 8   Brief hospital course  Mary [name] is a 89 y.o. female with medical history significant of HTN, chronic HFpEF, CAD, hypothyroidism, recurrent nosebleed off dual antiplatelet therapy, question of PAF, presented from home with worsening of cough shortness of breath and peripheral edema. Onset 7 days prior. Came to ED when noting SOB at rest.     04/16: to ED. CXR pulmonary congestion and bilateral pleural effusion, elevated HR and Afib RVR on EKG. Admitted to hospitalist service for acute on chronic HFpEF and Afib RVR. IV lasix , digoxin  loading IV also ordered Cardiology managing difficult to control afib. Currently on cardizem and digoxin . Eliquis  was restarted. S/p thoracentesis on 4/18 with removal. Has been weaned down to 2L [place]. PT/OT evaluated and she is extremely symptomatic and deconditioned at this time. Recommending SNF.   Assessment & Plan  Principal Problem:   CHF (congestive heart failure) (HCC) Active Problems:   Acute respiratory failure with hypoxia (HCC)   Acute on chronic diastolic CHF (congestive heart failure) (HCC)   A-fib (HCC)  Acute hypoxic respiratory failure- currently on 2L Due to pulmonary edema and pleural effusion from CHF O2 supplement, wean as able Treat underlying cause(s). Antibiotics were discontinued.    L Pleural effusion - noted large on Echo, moderate on CXR Thoracentesis 04/18 removed 150 mL L chest  Chest PT, IS, flutter valve   Acute on chronic HFpEF decompensation Essential HTN Orthostatic symptoms Question secondary to A-fib with RVR or may be primary/initial problem Echocardiogram --> EF 60-65%, no RWMA, indeterminate diastolic parameters but suspect some diastolic dysfunction given clinical CHF  Furosemide  40 mg po bid  Coreg  25 mg  bid Irbesartan  150mg  daily Hydralazine  discontinued Aspirin  81 mg daily d/c, restart Eliquis  5 mg bid per cardiology  Crestor  5 mg daily. Strict I&O's   PAF with RVR Coreg  12.5 mg twice daily --> 25 mg bid is controlling rate   Cardiology restarting digoxin . Follow potassium, magnesium  and phosphorus levels and correct electrolytes as needed Eliquis  5 mg bid   SIRS assoc w/ acute resp fail, Afib, CHF - resolved  Treat as above No leukocytosis/infection, no sepsis    Abnormal UA  Asymptomatic bacteriuria  UCx no growth VS more c/w SIRS/Afib/CHF rather than sepsis, denies urinary symptoms, will not treat Follow UCx --> no growth    Hypokalemia Replace as needed  Monitor BMP   Hypothyroidism TSH WNL, not likely to be contributing to cardiac issues as above  Continue Synthroid    Anxiety/depression Continue Prozac and trazodone  at bedtime   IIDM- A1c 6.1 Continue diet control   Deconditioning PT evaluation - pt very weak and likely inadequate reserve, she is now amenable to SNF rehab Placement in process   Body mass index is 23.63 kg/m.  VTE ppx:  apixaban  (ELIQUIS ) tablet 5 mg   Diet:     Diet   Diet Heart Room service appropriate? Yes with Assist; Fluid consistency: Thin   Consultants: Cardiology   Subjective 12/15/23    Pt reports feeling weak today. Denies SOB, CP. Currently soiled with urinary incontinence. Per nursing report, was anxious this am and so PRN ativan  was ordered-given shortly before my visit. She is alert and oriented.    Objective   Vitals:   12/14/23 2326 12/15/23  0325 12/15/23 0500 12/15/23 0829  BP: (!) 149/83 (!) 157/99  (!) 165/85  Pulse: 86 85  (!) 138  Resp: 18 18  18   Temp: 97.7 F (36.5 C) 97.6 F (36.4 C)  98 F (36.7 C)  TempSrc:    Oral  SpO2: 97% 95%  97%  Weight:   66.4 kg   Height:        Intake/Output Summary (Last 24 hours) at 12/15/2023 0832 Last data filed at 12/15/2023 0548 Gross per 24 hour  Intake 360 ml   Output 1300 ml  Net -940 ml   Filed Weights   12/13/23 0500 12/14/23 0500 12/15/23 0500  Weight: 69.5 kg 66.8 kg 66.4 kg     Physical Exam:  General: awake, alert, NAD Respiratory: normal respiratory effort. Cardiovascular: quick capillary refill, irregular rhythm, rapid.  Nervous: A&O x3. no gross focal neurologic deficits, normal speech Extremities: moves all equally, no edema, normal tone Skin: dry, intact, normal temperature, normal color. No rashes, lesions or ulcers on exposed skin Psychiatry: normal mood, congruent affect  Labs   I have personally reviewed the following labs and imaging studies CBC    Component Value Date/Time   WBC 7.1 12/14/2023 0521   RBC 4.06 12/14/2023 0521   HGB 12.7 12/14/2023 0521   HGB 12.1 12/30/2013 0554   HCT 40.2 12/14/2023 0521   HCT 37.4 12/30/2013 0554   PLT 158 12/14/2023 0521   PLT 129 (L) 12/30/2013 0554   MCV 99.0 12/14/2023 0521   MCV 92 12/30/2013 0554   MCH 31.3 12/14/2023 0521   MCHC 31.6 12/14/2023 0521   RDW 13.0 12/14/2023 0521   RDW 15.1 (H) 12/30/2013 0554   LYMPHSABS 1.2 12/07/2023 1242   LYMPHSABS 1.4 12/30/2013 0554   MONOABS 0.5 12/07/2023 1242   MONOABS 0.4 12/30/2013 0554   EOSABS 0.1 12/07/2023 1242   EOSABS 0.3 12/30/2013 0554   BASOSABS 0.0 12/07/2023 1242   BASOSABS 0.0 12/30/2013 0554      Latest Ref Rng & Units 12/15/2023    3:31 AM 12/14/2023    5:21 AM 12/13/2023    5:29 AM  BMP  Glucose 70 - 99 mg/dL 578  469  629   BUN 8 - 23 mg/dL 17  22  20    Creatinine 0.44 - 1.00 mg/dL 5.28  4.13  2.44   Sodium 135 - 145 mmol/L 140  133  137   Potassium 3.5 - 5.1 mmol/L 4.2  4.1  4.1   Chloride 98 - 111 mmol/L 91  92  93   CO2 22 - 32 mmol/L 39  35  36   Calcium  8.9 - 10.3 mg/dL 9.8  9.2  9.4     No results found.  Disposition Plan & Communication  Patient status: Inpatient  Admitted From: Home Planned disposition location: Skilled nursing facility Anticipated discharge date: 4/26 pending  clinical improvement   Family Communication: none at bedside.     Author: Ree Candy, DO [facility] 12/15/2023, 8:32 AM   Available by Epic secure chat 7AM-7PM. If 7PM-7AM, please contact night-coverage.  [facility] contact information found on [email or web address].

## 2023-12-15 NOTE — Progress Notes (Signed)
 Physical Therapy Treatment Patient Details Name: Mary [name] MRN: [id number] DOB: March 19, 1935 Today's Date: 12/15/2023   History of Present Illness Pt is an 89 y/o F admitted on 12/07/23 after presenting with worsening cough, SOB & peripheral edema. Pt found to have B pleural effusion, elevated HR, & a-fib with RVR. Pt is being treated for acute on chronic HFpEF & a-fib with RVR. PMH: HTN, chronic HFpEF, CAD, hypothyroidism, recurrent nosebleed, question of PAF, asthma, c diff infection, lumbar DDD, depression, DM2, diabetic retinopathy, heart murmur, HLD, RA, sleep apnea, spinal stenosis    PT Comments  Author responded to pt's bed alarm going off. Pt only has progressed BLEs off EOB but was not actually attempting to get OOB. She is overall more lethargic than previous date and per RN was given ativan  earlier due to pt's anxiousness. Pt was awake and was agreeable to attempt OOB activity however overall cognition continues to be different of her baseline. Pt is more disoriented today and even had one episode of hallucinations. HR in A-fib mostly between 1006-128 bpm. BP at rest 145/82 that dropped to 71/48 (52). Pt was alert throughout but unable to stay sitting up requesting to lay back down due to feeling SOB and posterior head/neck pain. She required min assist to return to supine with BP recovering quickly to 112/71(82). Pt quickly fell asleep once back in bed.  Several episodes of unexplained full body jerk/tremor but not consistent. Acute PT will continue efforts to progress as able per current POC.   If plan is discharge home, recommend the following: A little help with walking and/or transfers;A little help with bathing/dressing/bathroom;Assistance with cooking/housework;Assist for transportation;Help with stairs or ramp for entrance     Equipment Recommendations  BSC/3in1;Wheelchair (measurements PT);Wheelchair cushion (measurements PT)       Precautions / Restrictions  Precautions Precautions: Fall Recall of Precautions/Restrictions: Impaired Restrictions Weight Bearing Restrictions Per Provider Order: No     Mobility  Bed Mobility Overal bed mobility: Needs Assistance Bed Mobility: Supine to Sit, Sit to Supine  Supine to sit: Min assist Sit to supine: Min assist General bed mobility comments: BP at rest. 145/82 dropped to 71/48 (52) once back in bed BP recovers to 112/71(82)    Transfers  General transfer comment: Unable due to vitals response to just sitting up. 71/48 (52). HR in A-fib and tachy however did not dropped to bradycardic like previous date.     Balance Overall balance assessment: Needs assistance Sitting-balance support: Feet supported Sitting balance-Leahy Scale: Poor Sitting balance - Comments: pre-syncopal event  Standing balance comment: unable currently due to medical omplications/symptoms with change in positions     Communication Communication Communication: Impaired (mumbling speech) Factors Affecting Communication: Reduced clarity of speech  Cognition Arousal: Lethargic, Suspect due to medications Behavior During Therapy: WFL for tasks assessed/performed   PT - Cognitive impairments:  (cognition contniues to be inconsistent and not at her baseline)    PT - Cognition Comments: Pt was more disoriented today. Had one episode of hullucination during limited session Following commands: Impaired Following commands impaired: Follows one step commands with increased time    Cueing Cueing Techniques: Verbal cues, Tactile cues         Pertinent Vitals/Pain Pain Assessment Pain Assessment: 0-10 Pain Score: 8  Pain Location: back on my head + neck hurt Pain Descriptors / Indicators: Discomfort, Grimacing, Shooting Pain Intervention(s): Limited activity within patient's tolerance, Monitored during session, Premedicated before session     PT Goals (current goals can  now be found in the care plan section) Acute Rehab PT  Goals Patient Stated Goal: none stated Progress towards PT goals: Not progressing toward goals - comment    Frequency    Min 3X/week       AM-PAC PT "6 Clicks" Mobility   Outcome Measure  Help needed turning from your back to your side while in a flat bed without using bedrails?: A Little Help needed moving from lying on your back to sitting on the side of a flat bed without using bedrails?: A Little Help needed moving to and from a bed to a chair (including a wheelchair)?: A Lot Help needed standing up from a chair using your arms (e.g., wheelchair or bedside chair)?: A Lot Help needed to walk in hospital room?: A Lot Help needed climbing 3-5 steps with a railing? : A Lot 6 Click Score: 14    End of Session Equipment Utilized During Treatment: Oxygen Activity Tolerance: Treatment limited secondary to medical complications (Comment) (pt's cardiac/respiratory response to activity limiting session progression) Patient left: in bed;with call bell/phone within reach;with bed alarm set Nurse Communication: Mobility status PT Visit Diagnosis: Muscle weakness (generalized) (M62.81);Unsteadiness on feet (R26.81)     Time: 8469-6295 PT Time Calculation (min) (ACUTE ONLY): 13 min  Charges:    $Therapeutic Activity: 8-22 mins PT General Charges $$ ACUTE PT VISIT: 1 Visit                    Chester Costa PTA 12/15/23, 11:32 AM

## 2023-12-16 DIAGNOSIS — I5022 Chronic systolic (congestive) heart failure: Secondary | ICD-10-CM

## 2023-12-16 DIAGNOSIS — I480 Paroxysmal atrial fibrillation: Secondary | ICD-10-CM | POA: Diagnosis not present

## 2023-12-16 LAB — BASIC METABOLIC PANEL WITH GFR
Anion gap: 9 (ref 5–15)
BUN: 14 mg/dL (ref 8–23)
CO2: 39 mmol/L — ABNORMAL HIGH (ref 22–32)
Calcium: 9.2 mg/dL (ref 8.9–10.3)
Chloride: 88 mmol/L — ABNORMAL LOW (ref 98–111)
Creatinine, Ser: 0.6 mg/dL (ref 0.44–1.00)
GFR, Estimated: >60 mL/min (ref >60)
Glucose, Bld: 121 mg/dL — ABNORMAL HIGH (ref 70–99)
Potassium: 3.4 mmol/L — ABNORMAL LOW (ref 3.5–5.1)
Sodium: 136 mmol/L (ref 135–145)

## 2023-12-16 LAB — CBC
HCT: 40.7 % (ref 36.0–46.0)
Hemoglobin: 12.8 g/dL (ref 12.0–15.0)
MCH: 30.7 pg (ref 26.0–34.0)
MCHC: 31.4 g/dL (ref 30.0–36.0)
MCV: 97.6 fL (ref 80.0–100.0)
Platelets: 182 10*3/uL (ref 150–400)
RBC: 4.17 MIL/uL (ref 3.87–5.11)
RDW: 12.6 % (ref 11.5–15.5)
WBC: 6.9 10*3/uL (ref 4.0–10.5)
nRBC: 0 % (ref 0.0–0.2)

## 2023-12-16 MED ORDER — DIGOXIN 125 MCG PO TABS: 0.1250 mg | ORAL_TABLET | Freq: Every day | ORAL | Status: DC

## 2023-12-16 MED ORDER — CYCLOBENZAPRINE HCL 5 MG PO TABS: 5.0000 mg | ORAL_TABLET | Freq: Three times a day (TID) | ORAL | Status: AC | PRN

## 2023-12-16 MED ORDER — CARVEDILOL 25 MG PO TABS: 50.0000 mg | ORAL_TABLET | Freq: Two times a day (BID) | ORAL | Status: DC

## 2023-12-16 MED ORDER — IRBESARTAN 150 MG PO TABS: 150.0000 mg | ORAL_TABLET | Freq: Every day | ORAL | Status: DC

## 2023-12-16 MED ORDER — APIXABAN 5 MG PO TABS: 5.0000 mg | ORAL_TABLET | Freq: Two times a day (BID) | ORAL | Status: DC

## 2023-12-16 MED ORDER — TRAZODONE HCL 50 MG PO TABS: 25.0000 mg | ORAL_TABLET | Freq: Every evening | ORAL | Status: DC | PRN

## 2023-12-16 MED ORDER — POTASSIUM CHLORIDE CRYS ER 20 MEQ PO TBCR: 40.0000 meq | EXTENDED_RELEASE_TABLET | Freq: Once | ORAL | Status: DC

## 2023-12-16 MED ORDER — POTASSIUM CHLORIDE CRYS ER 20 MEQ PO TBCR
40.0000 meq | EXTENDED_RELEASE_TABLET | Freq: Two times a day (BID) | ORAL | Status: DC
  Administered 2023-12-16: 40 meq via ORAL
  Filled 2023-12-16: qty 2

## 2023-12-16 MED ORDER — METOPROLOL TARTRATE 5 MG/5ML IV SOLN
5.0000 mg | Freq: Four times a day (QID) | INTRAVENOUS | Status: DC | PRN
Start: 2023-12-16 — End: 2023-12-16

## 2023-12-16 MED ORDER — IRBESARTAN 150 MG PO TABS
300.0000 mg | ORAL_TABLET | Freq: Every day | ORAL | Status: DC
  Administered 2023-12-16: 300 mg via ORAL
  Filled 2023-12-16: qty 2

## 2023-12-16 MED ORDER — FUROSEMIDE 40 MG PO TABS: 40.0000 mg | ORAL_TABLET | Freq: Two times a day (BID) | ORAL | Status: DC

## 2023-12-16 NOTE — Plan of Care (Signed)
  Problem: Education: Goal: Knowledge of General Education information will improve Description: Including pain rating scale, medication(s)/side effects and non-pharmacologic comfort measures Outcome: Progressing   Problem: Clinical Measurements: Goal: Respiratory complications will improve Outcome: Progressing   Problem: Clinical Measurements: Goal: Cardiovascular complication will be avoided Outcome: Progressing   Problem: Activity: Goal: Risk for activity intolerance will decrease Outcome: Progressing   Problem: Elimination: Goal: Will not experience complications related to urinary retention Outcome: Progressing   Problem: Pain Managment: Goal: General experience of comfort will improve and/or be controlled Outcome: Progressing   Problem: Safety: Goal: Ability to remain free from injury will improve Outcome: Progressing

## 2023-12-16 NOTE — Progress Notes (Signed)
 On room air ambulating pt is 74% SpO2. On 2L [place], pt is 96% SpO2.

## 2023-12-16 NOTE — TOC Progression Note (Addendum)
 Transition of Care [facility]) - Progression Note    Patient Details  Name: Mary [name] MRN: [id number] Date of Birth: 1934-10-30  [facility]) CM/SW Contact  Baird Bombard, RN Phone Number: 12/16/2023, 10:33 AM  Clinical Narrative:    Spoke with patient to advised she was approved for [facility]. Patient stated she did not feel like she wanted to go. Patient advised she did not have to go to SNF however was a recommendation from therapy. Patient stated he did not want to go there to live. She was advised SNF stay would likely be 20 days or less depending on her needs. She is now agreeable to go. She was advised she may go today if the MD determines she is medically stable for discharge.   Spoke with gena from [facility]. She was advised patient may discharge today. [facility] provided [phone]         Expected Discharge Plan and Services                                               Social Determinants of Health (SDOH) Interventions SDOH Screenings   Food Insecurity: No Food Insecurity (12/07/2023)  Housing: Low Risk  (12/07/2023)  Transportation Needs: No Transportation Needs (12/07/2023)  Utilities: Not At Risk (12/07/2023)  Financial Resource Strain: Low Risk  (09/28/2023)   Received from [facility]  Social Connections: Moderately Isolated (12/07/2023)  Tobacco Use: Low Risk  (12/08/2023)    Readmission Risk Interventions     No data to display

## 2023-12-16 NOTE — Progress Notes (Signed)
 Physical Therapy Treatment Patient Details Name: Kattaleya B Mcguffin MRN: [id number] DOB: Mar 17, 1935 Today's Date: 12/16/2023   History of Present Illness Pt is an 89 y/o F admitted on 12/07/23 after presenting with worsening cough, SOB & peripheral edema. Pt found to have B pleural effusion, elevated HR, & a-fib with RVR. Pt is being treated for acute on chronic HFpEF & a-fib with RVR. PMH: HTN, chronic HFpEF, CAD, hypothyroidism, recurrent nosebleed, question of PAF, asthma, c diff infection, lumbar DDD, depression, DM2, diabetic retinopathy, heart murmur, HLD, RA, sleep apnea, spinal stenosis    PT Comments  PT/OT cotreat due to pt's mobility decline last session.  Pt seems more oriented today and able to follow commands well and progressed with mobility. Upon entering room, pt did not have O2 donned, SPO2 74% HR 80bpm; pt reporting she had forgotten to put it back on after she had breakfast. Made RN aware and donned [place] with 2 LO2. After sitting EOB, pt's SPO2 95% . Pt progressed with gait distance with RW 59' with recliner following.  Pt will benefit from continued PT services upon discharge to safely address deficits listed in patient problem list for decreased caregiver assistance and eventual return to PLOF.      If plan is discharge home, recommend the following: A little help with walking and/or transfers;A little help with bathing/dressing/bathroom;Assistance with cooking/housework;Assist for transportation;Help with stairs or ramp for entrance   Can travel by private vehicle     Yes  Equipment Recommendations  BSC/3in1;Wheelchair (measurements PT);Wheelchair cushion (measurements PT)    Recommendations for Other Services       Precautions / Restrictions Precautions Precautions: Fall Recall of Precautions/Restrictions: Impaired Restrictions Weight Bearing Restrictions Per Provider Order: No     Mobility  Bed Mobility Overal bed mobility: Needs Assistance Bed Mobility: Supine to  Sit     Supine to sit: Supervision, HOB elevated, Used rails          Transfers Overall transfer level: Needs assistance Equipment used: Rolling walker (2 wheels) Transfers: Bed to chair/wheelchair/BSC, Sit to/from Stand Sit to Stand: Contact guard assist   Step pivot transfers: Contact guard assist       General transfer comment: standing BP110/97;    Ambulation/Gait Ambulation/Gait assistance: Min assist Gait Distance (Feet): 80 Feet Assistive device: Rolling walker (2 wheels) Gait Pattern/deviations: Step-through pattern, Shuffle, Trunk flexed Gait velocity: decreased         Stairs             Wheelchair Mobility     Tilt Bed    Modified Rankin (Stroke Patients Only)       Balance Overall balance assessment: Needs assistance Sitting-balance support: Feet supported Sitting balance-Leahy Scale: Fair Sitting balance - Comments: pt not tolerating dynamic movement 2/2 neck pain.   Standing balance support: Bilateral upper extremity supported, Reliant on assistive device for balance Standing balance-Leahy Scale: Poor Standing balance comment: no issues with change of position.                            Communication Communication Communication: No apparent difficulties  Cognition Arousal: Alert Behavior During Therapy: WFL for tasks assessed/performed   PT - Cognitive impairments: Memory                       PT - Cognition Comments: Pt did not seem disoriented this session.  Upon entering room, pt did not have O2 donned, SPO2  74% HR 80bpm; pt reporting she had forgotten to put it back on after she had breakfast.  Made RN aware and donned [place] with 2 LO2.  After sitting EOB, pt's SPO2 95% . Following commands: Intact Following commands impaired: Only follows one step commands consistently    Cueing Cueing Techniques: Verbal cues  Exercises      General Comments        Pertinent Vitals/Pain      Home Living                           Prior Function            PT Goals (current goals can now be found in the care plan section) Acute Rehab PT Goals Patient Stated Goal: none stated PT Goal Formulation: With patient Time For Goal Achievement: 12/22/23 Potential to Achieve Goals: Good Progress towards PT goals: Progressing toward goals    Frequency    Min 3X/week      PT Plan      Co-evaluation PT/OT/SLP Co-Evaluation/Treatment: Yes Reason for Co-Treatment: For patient/therapist safety PT goals addressed during session: Mobility/safety with mobility        AM-PAC PT "6 Clicks" Mobility   Outcome Measure  Help needed turning from your back to your side while in a flat bed without using bedrails?: A Little Help needed moving from lying on your back to sitting on the side of a flat bed without using bedrails?: A Little Help needed moving to and from a bed to a chair (including a wheelchair)?: A Little Help needed standing up from a chair using your arms (e.g., wheelchair or bedside chair)?: A Little Help needed to walk in hospital room?: A Little Help needed climbing 3-5 steps with a railing? : A Lot 6 Click Score: 17    End of Session Equipment Utilized During Treatment: Oxygen;Gait belt Activity Tolerance: Patient limited by fatigue Patient left: in chair;with call bell/phone within reach;with chair alarm set Nurse Communication: Mobility status PT Visit Diagnosis: Muscle weakness (generalized) (M62.81);Unsteadiness on feet (R26.81)     Time: 7846-9629 (co-treat with OT) PT Time Calculation (min) (ACUTE ONLY): 30 min  Charges:    $Therapeutic Activity: 8-22 mins PT General Charges $$ ACUTE PT VISIT: 1 Visit                     Eliazar Gross, PTA  12/16/23, 12:18 PM

## 2023-12-16 NOTE — Progress Notes (Signed)
 [facility] CLINIC CARDIOLOGY PROGRESS NOTE   [name] MRN: [id number] DOB/AGE: 1935/01/21 89 y.o.  Admit date: 12/07/2023 Referring Physician Dr. Melodi Sprung Primary Physician Monique Ano, MD  Primary Cardiologist Dr. Larinda Plover Reason for Consultation AF RVR, heart failure   HPI: Mary [name] is a 89 y.o. female  with a past medical history of coronary artery disease by CT scan, peripheral vascular disease s/p L CEA, poorly controlled HTN, hyperlipidemia, type 2 diabetes, pulmonary hypertension who presented to the ED on 12/07/2023 for SOB and worsening edema. Found to be in AF RVR. Cardiology was consulted for further evaluation.    Interval History:  -Patient seen and examined this AM, reports feeling well overall today. -HR elevated on tele this AM. Denies CP, SOB, palpitations. -BP remains elevated.   Review of systems complete and found to be negative unless listed above    Vitals:   12/16/23 0457 12/16/23 0459 12/16/23 0619 12/16/23 0825  BP: (!) 152/108  (!) (P) 144/94 (!) 163/114  Pulse: 91   91  Resp: 20   16  Temp: 97.8 F (36.6 C)   97.6 F (36.4 C)  TempSrc:      SpO2: 94%   93%  Weight:  65.5 kg    Height:         Intake/Output Summary (Last 24 hours) at 12/16/2023 1025 Last data filed at 12/15/2023 2158 Gross per 24 hour  Intake 120 ml  Output --  Net 120 ml     PHYSICAL EXAM General: awake, well nourished, in no acute distress. HEENT: Normocephalic and atraumatic. Neck: No JVD.  Lungs: Normal respiratory effort. Clear bilaterally to auscultation. No wheezes, crackles, rhonchi.  Heart: Irregularly irregular. Normal S1 and S2 without gallops or murmurs. Radial & DP pulses 2+ bilaterally. Abdomen: Non-distended appearing.  Msk: Normal strength and tone for age. Extremities: No clubbing, cyanosis or edema.   Neuro: Alert and oriented X 3. Psych: Mood appropriate, affect congruent.    LABS: Basic Metabolic Panel: Recent Labs     12/15/23 0331 12/16/23 0454  NA 140 136  K 4.2 3.4*  CL 91* 88*  CO2 39* 39*  GLUCOSE 113* 121*  BUN 17 14  CREATININE 0.65 0.60  CALCIUM  9.8 9.2   Liver Function Tests: No results for input(s): "AST", "ALT", "ALKPHOS", "BILITOT", "PROT", "ALBUMIN" in the last 72 hours.  No results for input(s): "LIPASE", "AMYLASE" in the last 72 hours. CBC: Recent Labs    12/14/23 0521 12/16/23 0454  WBC 7.1 6.9  HGB 12.7 12.8  HCT 40.2 40.7  MCV 99.0 97.6  PLT 158 182    Cardiac Enzymes: No results for input(s): "CKTOTAL", "CKMB", "CKMBINDEX", "TROPONINIHS" in the last 72 hours.  BNP: No results for input(s): "BNP" in the last 72 hours.  D-Dimer: No results for input(s): "DDIMER" in the last 72 hours. Hemoglobin A1C: No results for input(s): "HGBA1C" in the last 72 hours. Fasting Lipid Panel: No results for input(s): "CHOL", "HDL", "LDLCALC", "TRIG", "CHOLHDL", "LDLDIRECT" in the last 72 hours. Thyroid Function Tests: No results for input(s): "TSH", "T4TOTAL", "T3FREE", "THYROIDAB" in the last 72 hours.  Invalid input(s): "FREET3"  Anemia Panel: No results for input(s): "VITAMINB12", "FOLATE", "FERRITIN", "TIBC", "IRON ", "RETICCTPCT" in the last 72 hours.  No results found.    ECHO 12/08/2023 1. Left ventricular ejection fraction, by estimation, is 60 to 65%. The left ventricle has normal function. The left ventricle has no regional wall motion abnormalities. Left ventricular diastolic parameters are  indeterminate.   2. Right ventricular systolic function is normal. The right ventricular size is normal.   3. Left atrial size was mildly dilated.   4. Large pleural effusion.   5. The mitral valve is normal in structure. Trivial mitral valve regurgitation. No evidence of mitral stenosis.   6. The aortic valve is normal in structure. Aortic valve regurgitation is not visualized. No aortic stenosis is present.   7. The inferior vena cava is normal in size with greater than  50% respiratory variability, suggesting right atrial pressure of 3 mmHg.    TELEMETRY reviewed by me 12/08/2023: atrial fibrillation rate 110-120s   EKG reviewed by me: AF RVR rate 112 bpm   Data reviewed by me 12/08/2023: last 24h vitals tele labs imaging I/O hospitalist progress note.   ASSESSMENT AND PLAN:  Principal Problem:   CHF (congestive heart failure) (HCC) Active Problems:   Acute respiratory failure with hypoxia (HCC)   Acute on chronic diastolic CHF (congestive heart failure) (HCC)   A-fib (HCC)   Mary [name] is a 89 y.o. female  with a past medical history of coronary artery disease by CT scan, peripheral vascular disease s/p L CEA, poorly controlled HTN, hyperlipidemia, type 2 diabetes, pulmonary hypertension who presented to the ED on 12/07/2023 for SOB and worsening edema. Found to be in AF RVR. Cardiology was consulted for further evaluation.    # Atrial fibrillation RVR # Acute on chronic heart failure preserved EF, now euvolemic Patient reported to the ED with worsening shortness of breath and lower extremity edema with elevated BNP of 520. Atrial fibrillation RVR on initial EKG. Per tele patient still in atrial fibrillation with rates in 90s this AM. BP and HR remain stable. Patient states SOB and LE edema have improved. Patient appears euvolemic on exam. Echo this admission with EF 60-65%, no WMAs. -Continue PO Lasix  40 mg twice daily. Renal function is stable. -Continue digoxin  0.125 mg daily for improved rate control.  -Increase Coreg  to 50 mg twice daily. -Increase irbesartan  to 300 mg daily. Hydral was DC due to low BP.  -Continue Crestor  5 mg daily. -Continue eliquis  5 mg twice daily for stroke risk reduction.  -ASA discontinued. Continue Eliquis  5 mg BID, monitor for bleeding  This patient's plan of care was discussed and created with Dr. Beau Bound and he is in agreement.    Signed: Hamp Levine, PA-C 12/16/2023, 10:25 AM [facility]  Cardiology

## 2023-12-16 NOTE — Progress Notes (Signed)
 Occupational Therapy Treatment Patient Details Name: Mary [name] MRN: [id number] DOB: Jul 16, 1935 Today's Date: 12/16/2023   History of present illness Pt is an 89 y/o F admitted on 12/07/23 after presenting with worsening cough, SOB & peripheral edema. Pt found to have B pleural effusion, elevated HR, & a-fib with RVR. Pt is being treated for acute on chronic HFpEF & a-fib with RVR. PMH: HTN, chronic HFpEF, CAD, hypothyroidism, recurrent nosebleed, question of PAF, asthma, c diff infection, lumbar DDD, depression, DM2, diabetic retinopathy, heart murmur, HLD, RA, sleep apnea, spinal stenosis   OT comments  Chart reviewed to date, pt greeted on edge of bed with PT present, agreeable to OT tx session targeting improving functional activity tolerance in preparation for ADL performance. Cognition grossly improved from yesterday. Improvements noted in overall mobility on this date with vitals grossly stable throughout. Pt reports initial dizziness upon standing, but no other reports during/after mobility. Please refer to details below. Pt is making progress towards goals, discharge recommendation remains appropriate.      If plan is discharge home, recommend the following:  A lot of help with walking and/or transfers;A lot of help with bathing/dressing/bathroom;Help with stairs or ramp for entrance   Equipment Recommendations  BSC/3in1;Wheelchair (measurements OT);Wheelchair cushion (measurements OT)    Recommendations for Other Services      Precautions / Restrictions Precautions Precautions: Fall Recall of Precautions/Restrictions: Impaired Restrictions Weight Bearing Restrictions Per Provider Order: No       Mobility Bed Mobility Overal bed mobility: Needs Assistance Bed Mobility: Supine to Sit     Supine to sit: Supervision, HOB elevated, Used rails          Transfers Overall transfer level: Needs assistance Equipment used: Rolling walker (2 wheels) Transfers: Bed to  chair/wheelchair/BSC, Sit to/from Stand Sit to Stand: Contact guard assist     Step pivot transfers: Contact guard assist           Balance Overall balance assessment: Needs assistance Sitting-balance support: Feet supported Sitting balance-Leahy Scale: Fair     Standing balance support: Bilateral upper extremity supported, Reliant on assistive device for balance Standing balance-Leahy Scale: Poor                             ADL either performed or assessed with clinical judgement   ADL Overall ADL's : Needs assistance/impaired                                       General ADL Comments: CGA-MIN A step pivot transfer to bedside commode, CGA for toileting; amb approx 100' with RW with +2 chair follow    Extremity/Trunk Assessment              Vision       Perception     Praxis     Communication Communication Communication: No apparent difficulties   Cognition Arousal: Alert Behavior During Therapy: WFL for tasks assessed/performed Cognition: Cognition impaired         Attention impairment (select first level of impairment): Selective attention Executive functioning impairment (select all impairments): Problem solving OT - Cognition Comments: improved as compared to yesterday                 Following commands: Intact Following commands impaired: Only follows one step commands consistently      Cueing   Cueing  Techniques: Verbal cues  Exercises Other Exercises Other Exercises: edu re: role of OT, role of rehab, safe ADL completion    Shoulder Instructions       General Comments HR between 110-120s bpm throughout, spo2 >90% on 2L, was noted to have oxgyen off when PT entered room and was in 70s% on RA    Pertinent Vitals/ Pain       Pain Assessment Pain Assessment: 0-10 Faces Pain Scale: Hurts even more Pain Location: enck Pain Descriptors / Indicators: Discomfort, Grimacing, Shooting Pain Intervention(s):  Premedicated before session, Monitored during session, Limited activity within patient's tolerance  Home Living                                          Prior Functioning/Environment              Frequency  Min 3X/week        Progress Toward Goals  OT Goals(current goals can now be found in the care plan section)  Progress towards OT goals: Progressing toward goals  Acute Rehab OT Goals Time For Goal Achievement: 12/28/23  Plan      Co-evaluation      Reason for Co-Treatment: For patient/therapist safety PT goals addressed during session: Mobility/safety with mobility        AM-PAC OT "6 Clicks" Daily Activity     Outcome Measure   Help from another person eating meals?: None Help from another person taking care of personal grooming?: None Help from another person toileting, which includes using toliet, bedpan, or urinal?: A Lot Help from another person bathing (including washing, rinsing, drying)?: A Lot Help from another person to put on and taking off regular upper body clothing?: A Little Help from another person to put on and taking off regular lower body clothing?: A Lot 6 Click Score: 17    End of Session Equipment Utilized During Treatment: Rolling walker (2 wheels);Oxygen  OT Visit Diagnosis: Other abnormalities of gait and mobility (R26.89);Muscle weakness (generalized) (M62.81)   Activity Tolerance Patient tolerated treatment well   Patient Left in chair;with call bell/phone within reach;with chair alarm set   Nurse Communication Mobility status        Time: 1610-9604 OT Time Calculation (min): 23 min  Charges: OT General Charges $OT Visit: 1 Visit OT Treatments $Therapeutic Activity: 8-22 mins  Gerre Kraft, OTD OTR/L  12/16/23, 1:25 PM

## 2023-12-16 NOTE — Discharge Summary (Signed)
 Physician Discharge Summary  Patient: Mary [name] YNW:[id number] DOB: [birth year]   Code Status: Limited: Do not attempt resuscitation (DNR) -DNR-LIMITED -Do Not Intubate/DNI  Admit date: 12/07/2023 Discharge date: 12/16/2023 Disposition: Skilled nursing facility, PT, OT, nurse aid, and RN PCP: Monique Ano, MD  Recommendations for Outpatient Follow-up:  Follow up with PCP within 1-2 weeks Regarding general hospital follow up and preventative care Recommend monitoring blood pressure Wean from O2 as tolerated.  Check BMP, CBC Follow up with cardiology  Discharge Diagnoses: Principal Problem:   CHF (congestive heart failure) (HCC) Active Problems:   Acute respiratory failure with hypoxia (HCC)   Acute on chronic diastolic CHF (congestive heart failure) (HCC)   A-fib [facility])  Brief Hospital Course Summary: Mary [name] is a 89 y.o. female with medical history significant of HTN, chronic HFpEF, CAD, hypothyroidism, recurrent nosebleed off dual antiplatelet therapy, question of PAF, presented from home with worsening of cough shortness of breath and peripheral edema. Onset 7 days prior.  She was found to have pulmonary congestion and pleural effusion thought to be related to CHF exacerbation worsened by Afib RVR.  S/p thoracentesis on 4/18 with removal. Has been weaned down to 2L [place]. She continued to have oxygen dependence of 2L throughout her admission so will require at dc but may be able to wean from it on follow up.   Had ongoing difficult to control rate due to intolerance of higher dose medications. She experienced hypotension with minimal exertion at the medication doses that would adequately control her heart rate. So, cardiology adjusted medications many times.  Currently, allowing for more room of hypertension appears to be preferred as she has less orthostatic symptoms when working with therapy.  She has remained hemodynamically stable and denies chest pain, SOB  currently. Her largest concern was her neck pain which was worsened by the hospital beds.   Refer to cardiac medication changes below including started on eliquis  (stopped aspirin , plavix ) which she tolerated well without any epistaxis.   PT/OT evaluated and she is extremely symptomatic and deconditioned at this time. Recommending SNF.  All other chronic conditions were treated with home medications.    Discharge Condition: Stable, improved Recommended discharge diet: Regular healthy diet  Consultations: Cardiology   Procedures/Studies: None   Allergies as of 12/16/2023       Reactions   Cephalexin Hives   Nitrofurantoin    Other reaction(s): Other (See Comments) Other Reaction: measles-like lesions   Sulfa Antibiotics Hives   Atorvastatin Other (See Comments)   Muscle spasms/ muscle pain        Medication List     STOP taking these medications    amLODipine  10 MG tablet Commonly known as: NORVASC    aspirin  EC 81 MG tablet   cefUROXime  500 MG tablet Commonly known as: CEFTIN    ciprofloxacin  500 MG tablet Commonly known as: CIPRO    cloNIDine  0.1 MG tablet Commonly known as: CATAPRES    clopidogrel  75 MG tablet Commonly known as: PLAVIX    hydrALAZINE  100 MG tablet Commonly known as: APRESOLINE    valsartan 320 MG tablet Commonly known as: DIOVAN Replaced by: irbesartan  150 MG tablet       TAKE these medications    acetaminophen  500 MG tablet Commonly known as: TYLENOL  Take 500 mg by mouth every 6 (six) hours as needed for mild pain (pain score 1-3).   apixaban  5 MG Tabs tablet Commonly known as: ELIQUIS  Take 1 tablet (5 mg total) by mouth 2 (two) times  daily.   azelastine 0.05 % ophthalmic solution Commonly known as: OPTIVAR Place 1 drop into both eyes daily as needed (allergies).   busPIRone  10 MG tablet Commonly known as: BUSPAR  Take 10 mg by mouth 2 (two) times daily.   carvedilol  25 MG tablet Commonly known as: COREG  Take 2 tablets (50  mg total) by mouth 2 (two) times daily with a meal. What changed:  medication strength how much to take Another medication with the same name was removed. Continue taking this medication, and follow the directions you see here.   cyclobenzaprine  5 MG tablet Commonly known as: FLEXERIL  Take 1 tablet (5 mg total) by mouth 3 (three) times daily as needed for muscle spasms.   digoxin  0.125 MG tablet Commonly known as: LANOXIN  Take 1 tablet (0.125 mg total) by mouth daily. Start taking on: December 17, 2023   furosemide  40 MG tablet Commonly known as: LASIX  Take 1 tablet (40 mg total) by mouth 2 (two) times daily.   Gemtesa  75 MG Tabs Generic drug: Vibegron  Take 1 tablet (75 mg total) by mouth daily at 6 (six) AM.   irbesartan  150 MG tablet Commonly known as: AVAPRO  Take 1 tablet (150 mg total) by mouth daily. Start taking on: December 17, 2023 Replaces: valsartan 320 MG tablet   levothyroxine  75 MCG tablet Commonly known as: SYNTHROID  Take 75 mcg by mouth daily.   metFORMIN  500 MG 24 hr tablet Commonly known as: GLUCOPHAGE -XR Take 500 mg by mouth daily. With dinner   pantoprazole  20 MG tablet Commonly known as: PROTONIX  Take 20 mg by mouth daily.   PARoxetine  40 MG tablet Commonly known as: PAXIL  Take 40 mg by mouth every evening.   rosuvastatin  5 MG tablet Commonly known as: CRESTOR  Take 1 tablet (5 mg total) by mouth daily.   traZODone  50 MG tablet Commonly known as: DESYREL  Take 0.5 tablets (25 mg total) by mouth at bedtime as needed for sleep.   vitamin B-12 500 MCG tablet Commonly known as: CYANOCOBALAMIN  Take 500 mcg by mouth every other day.   Vitron-C 65-125 MG Tabs Generic drug: Iron -Vitamin C  Take 1 tablet by mouth daily.               Durable Medical Equipment  (From admission, onward)           Start     Ordered   12/16/23 1243  For home use only DME oxygen  Once       Question Answer Comment  Length of Need 6 Months   Mode or (Route)  Nasal cannula   Liters per Minute 2   Oxygen delivery system Gas      12/16/23 1242   12/12/23 0828  For home use only DME Bedside commode  Once       Question:  Patient needs a bedside commode to treat with the following condition  Answer:  CHF (congestive heart failure) (HCC)   12/12/23 0827   12/12/23 0828  For home use only DME standard manual wheelchair with seat cushion  Once       Comments: Patient suffers from CHF, Afib, debility which impairs their ability to perform daily activities like bathing, dressing, grooming, and toileting in the home.  A cane, crutch, or walker will not resolve issue with performing activities of daily living. A wheelchair will allow patient to safely perform daily activities. Patient can safely propel the wheelchair in the home or has a caregiver who can provide assistance. Length of need Lifetime.  Accessories: elevating leg rests (ELRs), wheel locks, extensions and anti-tippers.   12/12/23 0827   12/12/23 0828  For home use only DME wheelchair cushion (seat and back)  Once        12/12/23 0827            Follow-up Information     Pendyal, Antone Batten, MD. Go in 1 week(s).   Specialty: Cardiology Contact information: [address] [place] [address] [phone]                 Subjective   Pt reports feeling ok today. Has continued neck pain. No CP, SOB. Agreeable to SNF.   All questions and concerns were addressed at time of discharge.  Objective  Blood pressure (!) 95/47, pulse 73, temperature 97.6 F (36.4 C), resp. rate 15, height 5\' 6"  (1.676 m), weight 65.5 kg, SpO2 96%.   General: Pt is alert, awake, not in acute distress Cardiovascular: RRR, S1/S2 +, no rubs, no gallops Respiratory: CTA bilaterally, no wheezing, no rhonchi Abdominal: Soft, NT, ND, bowel sounds + Extremities: no edema, no cyanosis  The results of significant diagnostics from this hospitalization (including imaging, microbiology, ancillary and  laboratory) are listed below for reference.   Imaging studies: US  THORACENTESIS ASP PLEURAL SPACE W/IMG GUIDE Result Date: 12/09/2023 INDICATION: Acute hypoxic respiratory failure.  Request for a thoracentesis. EXAM: ULTRASOUND GUIDED LEFT THORACENTESIS MEDICATIONS: None. COMPLICATIONS: None immediate. PROCEDURE: An ultrasound guided thoracentesis was thoroughly discussed with the patient and questions answered. The benefits, risks, alternatives and complications were also discussed. The patient understands and wishes to proceed with the procedure. Written consent was obtained. Ultrasound was performed to localize and mark an adequate pocket of fluid in the left chest. The area was then prepped and draped in the normal sterile fashion. 1% Lidocaine  was used for local anesthesia. Under ultrasound guidance a 6 Fr Safe-T-Centesis catheter was introduced. Thoracentesis was performed. The catheter was removed and a dressing applied. FINDINGS: Ultrasound demonstrated small bilateral pleural effusions. Left pleural effusion was slightly larger. A total of approximately 150 mL of yellow fluid was removed. Samples were sent to the laboratory as requested by the clinical team. IMPRESSION: Successful ultrasound guided left thoracentesis yielding 150 mL of pleural fluid. Electronically Signed   By: Elene Griffes M.D.   On: 12/09/2023 16:37   DG Chest Port 1 View Result Date: 12/09/2023 CLINICAL DATA:  142230 Pleural effusion 142230 [id number] Status post thoracentesis [id number] EXAM: PORTABLE CHEST 1 VIEW COMPARISON:  12/08/2023. FINDINGS: There is small left pleural effusion, decreased since the prior study, status post thoracentesis. No pneumothorax. There is trace right pleural effusion, which is similar to the prior study. Bilateral lung fields are otherwise clear. Stable cardio-mediastinal silhouette. No acute osseous abnormalities. The soft tissues are within normal limits. IMPRESSION: Small left pleural effusion, decreased  since the prior study, status post thoracentesis. No pneumothorax. Electronically Signed   By: Beula Brunswick M.D.   On: 12/09/2023 15:45   ECHOCARDIOGRAM COMPLETE Result Date: 12/08/2023    ECHOCARDIOGRAM REPORT   Patient Name:   Mary [name] Date of Exam: 12/08/2023 Medical Rec #:  [id number]     Height:       66.0 in Accession #:    [id number]    Weight:       158.3 lb Date of Birth:  [birth year]     BSA:          1.811 m Patient Age:    65 years  BP:           141/74 mmHg Patient Gender: F             HR:           106 bpm. Exam Location:  [facility] Procedure: 2D Echo, Cardiac Doppler and Color Doppler (Both Spectral and Color            Flow Doppler were utilized during procedure). Indications:     CHF--acute diastolic I50.31  History:         Patient has prior history of Echocardiogram examinations, most                  recent 09/18/2021. CAD, Signs/Symptoms:Murmur; Risk                  Factors:Hypertension.  Sonographer:     Broadus Canes Referring Phys:  [id number] [place] Diagnosing Phys: Lanell Pinta Custovic IMPRESSIONS  1. Left ventricular ejection fraction, by estimation, is 60 to 65%. The left ventricle has normal function. The left ventricle has no regional wall motion abnormalities. Left ventricular diastolic parameters are indeterminate.  2. Right ventricular systolic function is normal. The right ventricular size is normal.  3. Left atrial size was mildly dilated.  4. Large pleural effusion.  5. The mitral valve is normal in structure. Trivial mitral valve regurgitation. No evidence of mitral stenosis.  6. The aortic valve is normal in structure. Aortic valve regurgitation is not visualized. No aortic stenosis is present.  7. The inferior vena cava is normal in size with greater than 50% respiratory variability, suggesting right atrial pressure of 3 mmHg. FINDINGS  Left Ventricle: Left ventricular ejection fraction, by estimation, is 60 to 65%. The left ventricle has normal function. The left ventricle  has no regional wall motion abnormalities. The left ventricular internal cavity size was normal in size. There is  no left ventricular hypertrophy. Left ventricular diastolic parameters are indeterminate. Right Ventricle: The right ventricular size is normal. No increase in right ventricular wall thickness. Right ventricular systolic function is normal. Left Atrium: Left atrial size was mildly dilated. Right Atrium: Right atrial size was normal in size. Pericardium: There is no evidence of pericardial effusion. Mitral Valve: The mitral valve is normal in structure. Trivial mitral valve regurgitation. No evidence of mitral valve stenosis. Tricuspid Valve: The tricuspid valve is normal in structure. Tricuspid valve regurgitation is mild. Aortic Valve: The aortic valve is normal in structure. Aortic valve regurgitation is not visualized. No aortic stenosis is present. Aortic valve mean gradient measures 4.0 mmHg. Aortic valve peak gradient measures 7.0 mmHg. Aortic valve area, by VTI measures 3.93 cm. Pulmonic Valve: The pulmonic valve was normal in structure. Pulmonic valve regurgitation is not visualized. Aorta: The aortic root is normal in size and structure. Venous: The inferior vena cava is normal in size with greater than 50% respiratory variability, suggesting right atrial pressure of 3 mmHg. IAS/Shunts: No atrial level shunt detected by color flow Doppler. Additional Comments: There is a large pleural effusion.  LEFT VENTRICLE PLAX 2D LVIDd:         3.70 cm LVIDs:         2.30 cm LV PW:         0.80 cm LV IVS:        1.60 cm LVOT diam:     2.00 cm LV SV:         77 LV SV Index:   42 LVOT Area:  3.14 cm  RIGHT VENTRICLE RV Basal diam:  3.60 cm RV Mid diam:    3.20 cm LEFT ATRIUM             Index        RIGHT ATRIUM           Index LA diam:        4.40 cm 2.43 cm/m   RA Area:     14.40 cm LA Vol (A2C):   41.4 ml 22.87 ml/m  RA Volume:   31.70 ml  17.51 ml/m LA Vol (A4C):   39.4 ml 21.76 ml/m LA Biplane  Vol: 44.1 ml 24.36 ml/m  AORTIC VALVE AV Area (Vmax):    3.62 cm AV Area (Vmean):   3.86 cm AV Area (VTI):     3.93 cm AV Vmax:           132.00 cm/s AV Vmean:          87.100 cm/s AV VTI:            0.195 m AV Peak Grad:      7.0 mmHg AV Mean Grad:      4.0 mmHg LVOT Vmax:         152.00 cm/s LVOT Vmean:        107.000 cm/s LVOT VTI:          0.244 m LVOT/AV VTI ratio: 1.25  AORTA Ao Root diam: 3.40 cm MITRAL VALVE                TRICUSPID VALVE MV Area (PHT): 5.34 cm     TR Peak grad:   34.1 mmHg MV Decel Time: 142 msec     TR Vmax:        292.00 cm/s MV E velocity: 112.00 cm/s                             SHUNTS                             Systemic VTI:  0.24 m                             Systemic Diam: 2.00 cm Lanell Pinta Custovic Electronically signed by Isabell Manzanilla Signature Date/Time: 12/08/2023/12:14:23 PM    Final    DG Chest 1 View Result Date: 12/08/2023 CLINICAL DATA:  89 year old female with CHF. EXAM: CHEST  1 VIEW COMPARISON:  Portable chest yesterday and earlier. FINDINGS: Portable AP semi upright view at 0650 hours. Stable to mildly improved lung volumes. Stable cardiac size and mediastinal contours. Calcified aortic atherosclerosis. Layering pleural effusions have not significantly changed, small to moderate on the left and small on the right. No superimposed pneumothorax, consolidation. No overt edema, and pulmonary vascularity appears regressed. Stable visualized osseous structures.  Paucity of bowel gas. IMPRESSION: Regressed/resolved pulmonary vascular congestion since yesterday with stable small to moderate pleural effusions. Electronically Signed   By: Marlise Simpers M.D.   On: 12/08/2023 07:03   DG Chest Port 1 View Result Date: 12/07/2023 CLINICAL DATA:  Questionable sepsis - evaluate for abnormality EXAM: PORTABLE CHEST 1 VIEW COMPARISON:  09/17/2021. FINDINGS: There are diffuse mild-to-moderately increased interstitial markings. There is left retrocardiac airspace opacity obscuring the  left hemidiaphragm, descending thoracic aorta and blunting the left lateral costophrenic angle, suggesting combination of left lung atelectasis  and/or consolidation with pleural effusion. Bilateral lungs are otherwise clear. There is blunting of right lateral costophrenic angle suggesting small right pleural effusion. Stable cardio-mediastinal silhouette. No acute osseous abnormalities. The soft tissues are within normal limits. IMPRESSION: 1. Findings favor congestive heart failure/pulmonary edema. 2. There is left retrocardiac opacity, as described above. 3. Small bilateral pleural effusions. Electronically Signed   By: Beula Brunswick M.D.   On: 12/07/2023 13:35    Labs: Basic Metabolic Panel: Recent Labs  Lab 12/10/23 0843 12/11/23 0538 12/12/23 0513 12/13/23 0529 12/14/23 0521 12/15/23 0331 12/16/23 0454  NA 136   < > 138 137 133* 140 136  K 3.7   < > 4.0 4.1 4.1 4.2 3.4*  CL 93*   < > 94* 93* 92* 91* 88*  CO2 35*   < > 36* 36* 35* 39* 39*  GLUCOSE 121*   < > 116* 115* 143* 113* 121*  BUN 16   < > 13 20 22 17 14   CREATININE 0.66   < > 0.63 0.71 0.81 0.65 0.60  CALCIUM  8.9   < > 9.0 9.4 9.2 9.8 9.2  MG 1.7  --  1.7 1.7  --   --   --   PHOS  --   --  2.5  --   --   --   --    < > = values in this interval not displayed.   CBC: Recent Labs  Lab 12/14/23 0521 12/16/23 0454  WBC 7.1 6.9  HGB 12.7 12.8  HCT 40.2 40.7  MCV 99.0 97.6  PLT 158 182   Microbiology: Results for orders placed or performed during the hospital encounter of 12/07/23  Resp panel by RT-PCR (RSV, Flu A&B, Covid) Anterior Nasal Swab     Status: None   Collection Time: 12/07/23  1:09 PM   Specimen: Anterior Nasal Swab  Result Value Ref Range Status   SARS Coronavirus 2 by RT PCR NEGATIVE NEGATIVE Final    Comment: (NOTE) SARS-CoV-2 target nucleic acids are NOT DETECTED.  The SARS-CoV-2 RNA is generally detectable in upper respiratory specimens during the acute phase of infection. The  lowest concentration of SARS-CoV-2 viral copies this assay can detect is 138 copies/mL. A negative result does not preclude SARS-Cov-2 infection and should not be used as the sole basis for treatment or other patient management decisions. A negative result may occur with  improper specimen collection/handling, submission of specimen other than nasopharyngeal swab, presence of viral mutation(s) within the areas targeted by this assay, and inadequate number of viral copies(<138 copies/mL). A negative result must be combined with clinical observations, patient history, and epidemiological information. The expected result is Negative.  Fact Sheet for Patients:  BloggerCourse.com  Fact Sheet for Healthcare Providers:  SeriousBroker.it  This test is no t yet approved or cleared by the [place]  FDA and  has been authorized for detection and/or diagnosis of SARS-CoV-2 by FDA under an Emergency Use Authorization (EUA). This EUA will remain  in effect (meaning this test can be used) for the duration of the COVID-19 declaration under Section 564(b)(1) of the Act, 21 U.S.C.section 360bbb-3(b)(1), unless the authorization is terminated  or revoked sooner.       Influenza A by PCR NEGATIVE NEGATIVE Final   Influenza B by PCR NEGATIVE NEGATIVE Final    Comment: (NOTE) The Xpert Xpress SARS-CoV-2/FLU/RSV plus assay is intended as an aid in the diagnosis of influenza from Nasopharyngeal swab specimens and should not be used as  a sole basis for treatment. Nasal washings and aspirates are unacceptable for Xpert Xpress SARS-CoV-2/FLU/RSV testing.  Fact Sheet for Patients: BloggerCourse.com  Fact Sheet for Healthcare Providers: SeriousBroker.it  This test is not yet approved or cleared by the [place]  FDA and has been authorized for detection and/or diagnosis of SARS-CoV-2 by FDA under  an Emergency Use Authorization (EUA). This EUA will remain in effect (meaning this test can be used) for the duration of the COVID-19 declaration under Section 564(b)(1) of the Act, 21 U.S.C. section 360bbb-3(b)(1), unless the authorization is terminated or revoked.     Resp Syncytial Virus by PCR NEGATIVE NEGATIVE Final    Comment: (NOTE) Fact Sheet for Patients: BloggerCourse.com  Fact Sheet for Healthcare Providers: SeriousBroker.it  This test is not yet approved or cleared by the [place]  FDA and has been authorized for detection and/or diagnosis of SARS-CoV-2 by FDA under an Emergency Use Authorization (EUA). This EUA will remain in effect (meaning this test can be used) for the duration of the COVID-19 declaration under Section 564(b)(1) of the Act, 21 U.S.C. section 360bbb-3(b)(1), unless the authorization is terminated or revoked.  Performed at [facility], [address] [place], [place] [address]   Blood Culture (routine x 2)     Status: None   Collection Time: 12/07/23  1:09 PM   Specimen: BLOOD  Result Value Ref Range Status   Specimen Description   Final    BLOOD Blood Culture results may not be optimal due to an inadequate volume of blood received in culture bottles   Special Requests   Final    BOTTLES DRAWN AEROBIC AND ANAEROBIC RIGHT ANTECUBITAL   Culture   Final    NO GROWTH 5 DAYS Performed at [facility], [address] [place], [place] [address]    Report Status 12/12/2023 FINAL  Final  Blood Culture (routine x 2)     Status: None   Collection Time: 12/07/23  1:09 PM   Specimen: BLOOD  Result Value Ref Range Status   Specimen Description   Final    BLOOD Blood Culture results may not be optimal due to an inadequate volume of blood received in culture bottles   Special Requests   Final    BOTTLES DRAWN AEROBIC AND ANAEROBIC LEFT ANTECUBITAL   Culture   Final    NO GROWTH 5  DAYS Performed at [facility], [address] [place], [place] [address]    Report Status 12/12/2023 FINAL  Final  Urine Culture     Status: None   Collection Time: 12/07/23  1:09 PM   Specimen: Urine, Clean Catch  Result Value Ref Range Status   Specimen Description   Final    URINE, CLEAN CATCH Performed at [facility], [address], [place], [place] [address]    Special Requests   Final    NONE Reflexed from [id number] Performed at [facility], [address] [place], [place] [address]    Culture   Final    NO GROWTH Performed at [facility], [address] [place], [place] [address]    Report Status 12/08/2023 FINAL  Final    Time coordinating discharge: Over 30 minutes  Ree Candy, MD  [facility] 12/16/2023, 12:47 PM

## 2023-12-16 NOTE — TOC Transition Note (Addendum)
 Transition of Care [facility]) - Discharge Note   Patient Details  Name: Mary [name] MRN: [id number] Date of Birth: 1934/11/23  [facility] CM/SW Contact:  Jamielee Mchale C Camron Essman, RN Phone Number: 12/16/2023, 2:20 PM   Clinical Narrative:    Spoke with Gena in admissions . Per facility patient admission confirmed for today to [facility].  Patient assigned room # 808 Report will be called to [phone] Face sheet and medical necessity forms printed to the floor to be added to the EMS pack EMS arranged "There are several ahead of her."  Discharge summary and SNF transfer report sent in HUB.  Nurse, and patient notified.  TOC signing off.    Final next level of care: Skilled Nursing Facility Barriers to Discharge: Barriers Resolved   Patient Goals and CMS Choice Patient states their goals for this hospitalization and ongoing recovery are:: SNF          Discharge Placement              Patient chooses bed at: [facility] Patient to be transferred to facility by: [facility]   Patient and family notified of of transfer: 12/16/23  Discharge Plan and Services Additional resources added to the After Visit Summary for                                       Social Drivers of Health (SDOH) Interventions SDOH Screenings   Food Insecurity: No Food Insecurity (12/07/2023)  Housing: Low Risk  (12/07/2023)  Transportation Needs: No Transportation Needs (12/07/2023)  Utilities: Not At Risk (12/07/2023)  Financial Resource Strain: Low Risk  (09/28/2023)   Received from [facility]  Social Connections: Moderately Isolated (12/07/2023)  Tobacco Use: Low Risk  (12/08/2023)     Readmission Risk Interventions     No data to display

## 2024-01-07 ENCOUNTER — Observation Stay
Admission: EM | Admit: 2024-01-07 | Discharge: 2024-01-09 | Disposition: A | Discharge status: Home / Self Care | Attending: Internal Medicine | Admitting: Internal Medicine

## 2024-01-07 ENCOUNTER — Observation Stay

## 2024-01-07 ENCOUNTER — Emergency Department

## 2024-01-07 ENCOUNTER — Other Ambulatory Visit: Payer: Self-pay

## 2024-01-07 DIAGNOSIS — F419 Anxiety disorder, unspecified: Secondary | ICD-10-CM | POA: Insufficient documentation

## 2024-01-07 DIAGNOSIS — E113299 Type 2 diabetes mellitus with mild nonproliferative diabetic retinopathy without macular edema, unspecified eye: Secondary | ICD-10-CM | POA: Insufficient documentation

## 2024-01-07 DIAGNOSIS — Z79899 Other long term (current) drug therapy: Secondary | ICD-10-CM | POA: Diagnosis not present

## 2024-01-07 DIAGNOSIS — F32A Depression, unspecified: Secondary | ICD-10-CM | POA: Diagnosis not present

## 2024-01-07 DIAGNOSIS — I480 Paroxysmal atrial fibrillation: Secondary | ICD-10-CM | POA: Diagnosis not present

## 2024-01-07 DIAGNOSIS — I5032 Chronic diastolic (congestive) heart failure: Secondary | ICD-10-CM | POA: Diagnosis not present

## 2024-01-07 DIAGNOSIS — Z96643 Presence of artificial hip joint, bilateral: Secondary | ICD-10-CM | POA: Insufficient documentation

## 2024-01-07 DIAGNOSIS — E039 Hypothyroidism, unspecified: Secondary | ICD-10-CM | POA: Diagnosis not present

## 2024-01-07 DIAGNOSIS — I110 Hypertensive heart disease with heart failure: Secondary | ICD-10-CM | POA: Insufficient documentation

## 2024-01-07 DIAGNOSIS — I2510 Atherosclerotic heart disease of native coronary artery without angina pectoris: Secondary | ICD-10-CM | POA: Diagnosis not present

## 2024-01-07 DIAGNOSIS — Z96653 Presence of artificial knee joint, bilateral: Secondary | ICD-10-CM | POA: Insufficient documentation

## 2024-01-07 DIAGNOSIS — I4891 Unspecified atrial fibrillation: Principal | ICD-10-CM | POA: Diagnosis present

## 2024-01-07 DIAGNOSIS — J4489 Other specified chronic obstructive pulmonary disease: Secondary | ICD-10-CM | POA: Diagnosis not present

## 2024-01-07 DIAGNOSIS — Z7984 Long term (current) use of oral hypoglycemic drugs: Secondary | ICD-10-CM | POA: Diagnosis not present

## 2024-01-07 DIAGNOSIS — Z7901 Long term (current) use of anticoagulants: Secondary | ICD-10-CM | POA: Diagnosis not present

## 2024-01-07 DIAGNOSIS — R002 Palpitations: Secondary | ICD-10-CM | POA: Diagnosis present

## 2024-01-07 LAB — CBC WITH DIFFERENTIAL/PLATELET
Abs Immature Granulocytes: 0.02 10*3/uL (ref 0.00–0.07)
Basophils Absolute: 0 10*3/uL (ref 0.0–0.1)
Basophils Relative: 1 %
Eosinophils Absolute: 0.3 10*3/uL (ref 0.0–0.5)
Eosinophils Relative: 3 %
HCT: 45.7 % (ref 36.0–46.0)
Hemoglobin: 14.2 g/dL (ref 12.0–15.0)
Immature Granulocytes: 0 %
Lymphocytes Relative: 17 %
Lymphs Abs: 1.4 10*3/uL (ref 0.7–4.0)
MCH: 31.1 pg (ref 26.0–34.0)
MCHC: 31.1 g/dL (ref 30.0–36.0)
MCV: 100.2 fL — ABNORMAL HIGH (ref 80.0–100.0)
Monocytes Absolute: 0.7 10*3/uL (ref 0.1–1.0)
Monocytes Relative: 9 %
Neutro Abs: 5.6 10*3/uL (ref 1.7–7.7)
Neutrophils Relative %: 70 %
Platelets: 111 10*3/uL — ABNORMAL LOW (ref 150–400)
RBC: 4.56 MIL/uL (ref 3.87–5.11)
RDW: 13.4 % (ref 11.5–15.5)
WBC: 8 10*3/uL (ref 4.0–10.5)
nRBC: 0 % (ref 0.0–0.2)

## 2024-01-07 LAB — BASIC METABOLIC PANEL WITH GFR
Anion gap: 12 (ref 5–15)
BUN: 14 mg/dL (ref 8–23)
CO2: 31 mmol/L (ref 22–32)
Calcium: 8.5 mg/dL — ABNORMAL LOW (ref 8.9–10.3)
Chloride: 94 mmol/L — ABNORMAL LOW (ref 98–111)
Creatinine, Ser: 0.67 mg/dL (ref 0.44–1.00)
GFR, Estimated: >60 mL/min (ref >60)
Glucose, Bld: 149 mg/dL — ABNORMAL HIGH (ref 70–99)
Potassium: 2.8 mmol/L — ABNORMAL LOW (ref 3.5–5.1)
Sodium: 137 mmol/L (ref 135–145)

## 2024-01-07 LAB — TROPONIN I (HIGH SENSITIVITY)
Troponin I (High Sensitivity): 27 ng/L — ABNORMAL HIGH (ref <18)
Troponin I (High Sensitivity): 30 ng/L — ABNORMAL HIGH (ref <18)

## 2024-01-07 LAB — MAGNESIUM: Magnesium: 1.6 mg/dL — ABNORMAL LOW (ref 1.7–2.4)

## 2024-01-07 LAB — BRAIN NATRIURETIC PEPTIDE: B Natriuretic Peptide: 283.6 pg/mL — ABNORMAL HIGH (ref 0.0–100.0)

## 2024-01-07 LAB — PHOSPHORUS: Phosphorus: 1.8 mg/dL — ABNORMAL LOW (ref 2.5–4.6)

## 2024-01-07 MED ORDER — PANTOPRAZOLE SODIUM 20 MG PO TBEC
20.0000 mg | DELAYED_RELEASE_TABLET | Freq: Every day | ORAL | Status: DC
  Administered 2024-01-07 – 2024-01-09 (×3): 20 mg via ORAL
  Filled 2024-01-07 (×3): qty 1

## 2024-01-07 MED ORDER — SODIUM CHLORIDE 0.9% FLUSH: 3.0000 mL | INTRAVENOUS | Status: DC | PRN

## 2024-01-07 MED ORDER — METFORMIN HCL ER 500 MG PO TB24
500.0000 mg | ORAL_TABLET | Freq: Every day | ORAL | Status: DC
  Administered 2024-01-08 – 2024-01-09 (×2): 500 mg via ORAL
  Filled 2024-01-07 (×2): qty 1

## 2024-01-07 MED ORDER — LEVOTHYROXINE SODIUM 75 MCG PO TABS
75.0000 ug | ORAL_TABLET | Freq: Every day | ORAL | Status: DC
  Administered 2024-01-08 – 2024-01-09 (×2): 75 ug via ORAL
  Filled 2024-01-07: qty 3
  Filled 2024-01-07 (×2): qty 1

## 2024-01-07 MED ORDER — TRAZODONE HCL 50 MG PO TABS
25.0000 mg | ORAL_TABLET | Freq: Every evening | ORAL | Status: DC | PRN
  Administered 2024-01-08: 25 mg via ORAL
  Filled 2024-01-07: qty 1

## 2024-01-07 MED ORDER — POTASSIUM CHLORIDE CRYS ER 20 MEQ PO TBCR
40.0000 meq | EXTENDED_RELEASE_TABLET | ORAL | Status: AC
  Administered 2024-01-07 (×2): 40 meq via ORAL
  Filled 2024-01-07 (×2): qty 2

## 2024-01-07 MED ORDER — IRBESARTAN 150 MG PO TABS: 150.0000 mg | ORAL_TABLET | Freq: Every day | ORAL | Status: DC

## 2024-01-07 MED ORDER — CYCLOBENZAPRINE HCL 10 MG PO TABS
5.0000 mg | ORAL_TABLET | Freq: Three times a day (TID) | ORAL | Status: DC | PRN
Start: 2024-01-07 — End: 2024-01-09
  Administered 2024-01-08 (×2): 5 mg via ORAL
  Filled 2024-01-07 (×3): qty 1

## 2024-01-07 MED ORDER — MAGNESIUM SULFATE IN D5W 1-5 GM/100ML-% IV SOLN
1.0000 g | Freq: Once | INTRAVENOUS | Status: AC
  Administered 2024-01-07: 1 g via INTRAVENOUS
  Filled 2024-01-07: qty 100

## 2024-01-07 MED ORDER — DILTIAZEM HCL 25 MG/5ML IV SOLN
5.0000 mg | Freq: Once | INTRAVENOUS | Status: AC
  Administered 2024-01-07: 5 mg via INTRAVENOUS
  Filled 2024-01-07: qty 5

## 2024-01-07 MED ORDER — DIGOXIN 125 MCG PO TABS: 0.1250 mg | ORAL_TABLET | Freq: Every day | ORAL | Status: DC

## 2024-01-07 MED ORDER — ONDANSETRON HCL 4 MG/2ML IJ SOLN: 4.0000 mg | Freq: Four times a day (QID) | INTRAMUSCULAR | Status: DC | PRN

## 2024-01-07 MED ORDER — APIXABAN 5 MG PO TABS
5.0000 mg | ORAL_TABLET | Freq: Two times a day (BID) | ORAL | Status: DC
  Administered 2024-01-07 – 2024-01-09 (×4): 5 mg via ORAL
  Filled 2024-01-07 (×4): qty 1

## 2024-01-07 MED ORDER — DILTIAZEM HCL-DEXTROSE 125-5 MG/125ML-% IV SOLN (PREMIX)
5.0000 mg/h | INTRAVENOUS | Status: DC
  Administered 2024-01-07: 5 mg/h via INTRAVENOUS
  Filled 2024-01-07: qty 125

## 2024-01-07 MED ORDER — KETOTIFEN FUMARATE 0.035 % OP SOLN
1.0000 [drp] | Freq: Two times a day (BID) | OPHTHALMIC | Status: DC
  Administered 2024-01-08: 1 [drp] via OPHTHALMIC
  Filled 2024-01-07: qty 5

## 2024-01-07 MED ORDER — BUSPIRONE HCL 10 MG PO TABS
10.0000 mg | ORAL_TABLET | Freq: Two times a day (BID) | ORAL | Status: DC
  Administered 2024-01-07 – 2024-01-09 (×4): 10 mg via ORAL
  Filled 2024-01-07 (×2): qty 1
  Filled 2024-01-07 (×2): qty 2

## 2024-01-07 MED ORDER — SODIUM CHLORIDE 0.9% FLUSH
3.0000 mL | Freq: Two times a day (BID) | INTRAVENOUS | Status: DC
  Administered 2024-01-07 – 2024-01-09 (×3): 3 mL via INTRAVENOUS

## 2024-01-07 MED ORDER — SODIUM CHLORIDE 0.9 % IV SOLN: 250.0000 mL | INTRAVENOUS | Status: AC | PRN

## 2024-01-07 MED ORDER — DIGOXIN 125 MCG PO TABS
0.1250 mg | ORAL_TABLET | Freq: Every day | ORAL | Status: DC
  Administered 2024-01-08 – 2024-01-09 (×2): 0.125 mg via ORAL
  Filled 2024-01-07 (×2): qty 1

## 2024-01-07 MED ORDER — ROSUVASTATIN CALCIUM 5 MG PO TABS
5.0000 mg | ORAL_TABLET | Freq: Every day | ORAL | Status: DC
  Administered 2024-01-07 – 2024-01-09 (×3): 5 mg via ORAL
  Filled 2024-01-07 (×3): qty 1

## 2024-01-07 MED ORDER — PAROXETINE HCL 20 MG PO TABS
40.0000 mg | ORAL_TABLET | Freq: Every evening | ORAL | Status: DC
  Administered 2024-01-07 – 2024-01-08 (×2): 40 mg via ORAL
  Filled 2024-01-07 (×3): qty 2

## 2024-01-07 MED ORDER — ACETAMINOPHEN 500 MG PO TABS
500.0000 mg | ORAL_TABLET | Freq: Four times a day (QID) | ORAL | Status: DC | PRN
  Administered 2024-01-08 (×2): 500 mg via ORAL
  Filled 2024-01-07 (×2): qty 1

## 2024-01-07 MED ORDER — POTASSIUM CHLORIDE 10 MEQ/100ML IV SOLN
10.0000 meq | INTRAVENOUS | Status: AC
  Administered 2024-01-07 (×2): 10 meq via INTRAVENOUS
  Filled 2024-01-07: qty 100

## 2024-01-07 NOTE — ED Triage Notes (Signed)
 Coming from home. Complaining of left neck pain going on for months that got worse today. No falls recently. Patient also has heart palpitations that started this morning. Patient is in A-fib that has had a rate up to 160 with Ems. 135/82, 94%RA, Patient just got home from [facility] on Monday that she is unsure why she was there. And she has recurrent UTIs that she knows of. Patient is hx diabetic.

## 2024-01-07 NOTE — ED Provider Notes (Signed)
 [facility] Provider Note    Event Date/Time   First MD Initiated Contact with Patient 01/07/24 1413     (approximate)   History   Chief Complaint Palpitations   HPI  Mary [name] is a 89 y.o. female with past medical history of hypertension, CAD, CHFpEF, atrial fibrillation on Eliquis , and hypothyroidism who presents to the ED complaining of palpitations.  Patient reports that she has been feeling like her heart is racing since yesterday.  She denies any associated chest pain or shortness of breath, does complain of pain in the left side of her neck, which she states has been going on for multiple weeks.  She denies any trauma to her head or neck, has been taking medications as prescribed.  Per EMS, patient found to be in atrial fibrillation with heart rate up to the 160s.  She was recently in the hospital for atrial fibrillation with RVR, returned home from rehab 5 days ago.      Physical Exam   Triage Vital Signs: ED Triage Vitals  Encounter Vitals Group     BP --      Systolic BP Percentile --      Diastolic BP Percentile --      Pulse Rate 01/07/24 1411 (!) 145     Resp --      Temp 01/07/24 1411 98.6 F (37 C)     Temp Source 01/07/24 1411 Oral     SpO2 01/07/24 1411 92 %     Weight 01/07/24 1412 148 lb 12.8 oz (67.5 kg)     Height 01/07/24 1412 5' (1.524 m)     Head Circumference --      Peak Flow --      Pain Score 01/07/24 1412 6     Pain Loc --      Pain Education --      Exclude from Growth Chart --     Most recent vital signs: Vitals:   01/07/24 1411  Pulse: (!) 145  Temp: 98.6 F (37 C)  SpO2: 92%    Constitutional: Alert and oriented. Eyes: Conjunctivae are normal. Head: Atraumatic. Nose: No congestion/rhinnorhea. Mouth/Throat: Mucous membranes are moist.  Cardiovascular: Tachycardic, irregularly irregular rhythm. Grossly normal heart sounds.  2+ radial pulses bilaterally. Respiratory: Normal respiratory effort.  No  retractions. Lungs CTAB. Gastrointestinal: Soft and nontender. No distention. Musculoskeletal: No lower extremity tenderness nor edema.  Neurologic:  Normal speech and language. No gross focal neurologic deficits are appreciated.    ED Results / Procedures / Treatments   Labs (all labs ordered are listed, but only abnormal results are displayed) Labs Reviewed  CBC WITH DIFFERENTIAL/PLATELET  BASIC METABOLIC PANEL WITH GFR  MAGNESIUM   DIGOXIN  LEVEL  BRAIN NATRIURETIC PEPTIDE  TROPONIN I (HIGH SENSITIVITY)     EKG  ED ECG REPORT I, Twilla Galea, the attending physician, personally viewed and interpreted this ECG.   Date: 01/07/2024  EKG Time: 14:14  Rate: 134  Rhythm: atrial fibrillation  Axis: Normal  Intervals:none  ST&T Change: Diffuse ST depressions, rate related  RADIOLOGY Chest x-ray reviewed and interpreted by me with cardiomegaly, no infiltrate, edema, or effusion.  PROCEDURES:  Critical Care performed: Yes, see critical care procedure note(s)  .Critical Care  Performed by: Twilla Galea, MD Authorized by: Twilla Galea, MD   Critical care provider statement:    Critical care time (minutes):  30   Critical care time was exclusive of:  Teaching time and separately billable procedures  and treating other patients   Critical care was necessary to treat or prevent imminent or life-threatening deterioration of the following conditions:  Cardiac failure (Atrial fibrillation with RVR)   Critical care was time spent personally by me on the following activities:  Development of treatment plan with patient or surrogate, discussions with consultants, evaluation of patient's response to treatment, examination of patient, ordering and review of laboratory studies, ordering and review of radiographic studies, ordering and performing treatments and interventions, pulse oximetry, re-evaluation of patient's condition and review of old charts   I assumed direction of  critical care for this patient from another provider in my specialty: no      MEDICATIONS ORDERED IN ED: Medications  diltiazem (CARDIZEM) injection 5 mg (has no administration in time range)  diltiazem (CARDIZEM) 125 mg in dextrose  5% 125 mL (1 mg/mL) infusion (has no administration in time range)     IMPRESSION / MDM / ASSESSMENT AND PLAN / ED COURSE  I reviewed the triage vital signs and the nursing notes.                              89 y.o. female with past medical history of hypertension, CAD, CHFpEF, atrial fibrillation on Eliquis , and hypothyroidism who presents to the ED complaining of palpitations and feeling like her heart is racing since yesterday.  Patient's presentation is most consistent with acute presentation with potential threat to life or bodily function.  Differential diagnosis includes, but is not limited to, arrhythmia, ACS, PE, CHF exacerbation, electrolyte abnormality, AKI, anemia, sepsis.  Patient nontoxic-appearing and in no acute distress, vital signs remarkable for tachycardia but otherwise reassuring with stable BP.  EKG shows atrial fibrillation with RVR, rates ranging from the 130s to 140s.  She had recent admission for similar presentation, had CHF exacerbation as well as A-fib RVR at that time.  She was quite sensitive to rate control medications during that admission, will give small bolus dose of IV diltiazem and started on diltiazem drip.  Labs and chest x-ray are pending at this time.  Patient turned over to oncoming provider pending lab results and reassessment of rate control.  Anticipate admission for atrial fibrillation with RVR.      FINAL CLINICAL IMPRESSION(S) / ED DIAGNOSES   Final diagnoses:  Atrial fibrillation with RVR (HCC)     Rx / DC Orders   ED Discharge Orders     None        Note:  This document was prepared using Dragon voice recognition software and may include unintentional dictation errors.   Twilla Galea,  MD 01/07/24 [phone]

## 2024-01-07 NOTE — H&P (Signed)
 History and Physical    Mary [name] VHQ:[id number] DOB: 1935/05/16 DOA: 01/07/2024  PCP: Monique Ano, MD (Confirm with patient/family/NH records and if not entered, this has to be entered at [facility] point of entry) Patient coming from: Home  I have personally briefly reviewed patient's old medical records in [facility]  Chief Complaint: Palpitations  HPI: Mary [name] is a 89 y.o. female with medical history significant of PAF on Eliquis , HTN, CAD, asthma/COPD, chronic HFpEF, hypothyroidism, presented with palpitations.  Patient reported that she has been bothered by worsening of neck pain recently, worsening with turning of head.  This morning, she started to have a strong feeling of palpitations and decided to come to ED.  Denies any chest pain no shortness of breath no cough, no diarrhea.  ED Course: Afebrile, heart rate in the 140s on arrival blood pressure 130/80 O2 saturation 92% on room air.  Chest x-ray showed no acute infiltrates.  Blood work showed K2.8 sodium 137 BUN 14 creatinine 0.6 bicarb 21, WBC 8.0 hemoglobin 14.2.  Patient was started on Cardizem drip.  Review of Systems: As per HPI otherwise 14 point review of systems negative.    Past Medical History:  Diagnosis Date   Acid reflux    Anemia    Arthritis    Asthma    Atherosclerosis of abdominal aorta (HCC)    B12 deficiency    Clostridium difficile infection    H/O   Coronary artery disease    DDD (degenerative disc disease), lumbar    Depression    Diabetes (HCC)    type 2   Diabetic retinopathy (HCC)    Dysrhythmia    Heart murmur    HLD (hyperlipidemia)    HTN (hypertension)    Hypotension    when get up too quickly   Hypothyroidism    Pneumonia    PONV (postoperative nausea and vomiting)    Pulmonary nodule    right middle lobe on CT scan   Pure hypercholesterolemia    RA (rheumatoid arthritis) (HCC)    Sleep apnea    Spinal stenosis    Urinary urgency     Past Surgical  History:  Procedure Laterality Date   ABDOMINAL HYSTERECTOMY     CATARACT EXTRACTION, BILATERAL     COLONOSCOPY N/A 07/29/2021   Procedure: COLONOSCOPY;  Surgeon: Toledo, Alphonsus Jeans, MD;  Location: [facility] ENDOSCOPY;  Service: Gastroenterology;  Laterality: N/A;   CYSTOSCOPY     ENDARTERECTOMY Right 12/02/2021   Procedure: ENDARTERECTOMY CAROTID;  Surgeon: Celso College, MD;  Location: [facility] ORS;  Service: Vascular;  Laterality: Right;   ESOPHAGOGASTRODUODENOSCOPY N/A 07/29/2021   Procedure: ESOPHAGOGASTRODUODENOSCOPY (EGD);  Surgeon: Toledo, Alphonsus Jeans, MD;  Location: [facility] ENDOSCOPY;  Service: Gastroenterology;  Laterality: N/A;  DM   HIP ARTHROPLASTY Right 09/24/2015   Procedure: ARTHROPLASTY BIPOLAR HIP (HEMIARTHROPLASTY);  Surgeon: Elner Hahn, MD;  Location: [facility] ORS;  Service: Orthopedics;  Laterality: Right;   HIP ARTHROPLASTY Left 09/29/2018   Procedure: ARTHROPLASTY BIPOLAR HIP (HEMIARTHROPLASTY) LEFT;  Surgeon: Elner Hahn, MD;  Location: [facility] ORS;  Service: Orthopedics;  Laterality: Left;   KNEE ARTHROSCOPY W/ AUTOGENOUS CARTILAGE IMPLANTATION (ACI) PROCEDURE     TOTAL KNEE ARTHROPLASTY Left 12/01/2017   Procedure: TOTAL KNEE ARTHROPLASTY;  Surgeon: Elner Hahn, MD;  Location: [facility] ORS;  Service: Orthopedics;  Laterality: Left;   TOTAL KNEE ARTHROPLASTY Right 2014   VARICOSE VEIN SURGERY Left    leg     reports that she  has never smoked. She has never used smokeless tobacco. She reports that she does not drink alcohol  and does not use drugs.  Allergies  Allergen Reactions   Cephalexin Hives   Nitrofurantoin     Other reaction(s): Other (See Comments) Other Reaction: measles-like lesions   Sulfa Antibiotics Hives   Atorvastatin Other (See Comments)    Muscle spasms/ muscle pain    Family History  Problem Relation Age of Onset   Kidney disease Brother        also nephew   Heart disease Mother    Heart disease Father    Prostate cancer Neg Hx    Bladder Cancer Neg Hx     Breast cancer Neg Hx    Kidney cancer Neg Hx      Prior to Admission medications   Medication Sig Start Date End Date Taking? Authorizing Provider  acetaminophen  (TYLENOL ) 500 MG tablet Take 500 mg by mouth every 6 (six) hours as needed for mild pain (pain score 1-3).    [provider]  apixaban  (ELIQUIS ) 5 MG TABS tablet Take 1 tablet (5 mg total) by mouth 2 (two) times daily. 12/16/23   Anderson, Chelsey L, MD  azelastine (OPTIVAR) 0.05 % ophthalmic solution Place 1 drop into both eyes daily as needed (allergies).    [provider]  busPIRone  (BUSPAR ) 10 MG tablet Take 10 mg by mouth 2 (two) times daily.    [provider]  carvedilol  (COREG ) 25 MG tablet Take 2 tablets (50 mg total) by mouth 2 (two) times daily with a meal. 12/16/23   Ree Candy, MD  cyclobenzaprine  (FLEXERIL ) 5 MG tablet Take 1 tablet (5 mg total) by mouth 3 (three) times daily as needed for muscle spasms. 12/16/23   Ree Candy, MD  digoxin  (LANOXIN ) 0.125 MG tablet Take 1 tablet (0.125 mg total) by mouth daily. 12/17/23   Ree Candy, MD  furosemide  (LASIX ) 40 MG tablet Take 1 tablet (40 mg total) by mouth 2 (two) times daily. 12/16/23   Ree Candy, MD  irbesartan  (AVAPRO ) 150 MG tablet Take 1 tablet (150 mg total) by mouth daily. 12/17/23   Ree Candy, MD  Iron -Vitamin C  (VITRON-C) 65-125 MG TABS Take 1 tablet by mouth daily. 02/24/21   Timmy Forbes, MD  levothyroxine  (SYNTHROID , LEVOTHROID) 75 MCG tablet Take 75 mcg by mouth daily.     [provider]  metFORMIN  (GLUCOPHAGE -XR) 500 MG 24 hr tablet Take 500 mg by mouth daily. With dinner 09/28/23   [provider]  pantoprazole  (PROTONIX ) 20 MG tablet Take 20 mg by mouth daily.    [provider]  PARoxetine  (PAXIL ) 40 MG tablet Take 40 mg by mouth every evening.    [provider]  rosuvastatin  (CRESTOR ) 5 MG tablet Take 1 tablet (5 mg total) by mouth daily. 04/22/22   Brown,  Fallon E, NP  traZODone  (DESYREL ) 50 MG tablet Take 0.5 tablets (25 mg total) by mouth at bedtime as needed for sleep. 12/16/23   Ree Candy, MD  Vibegron  (GEMTESA ) 75 MG TABS Take 1 tablet (75 mg total) by mouth daily at 6 (six) AM. 11/16/23   Stoioff, Kizzie Perks, MD  vitamin B-12 (CYANOCOBALAMIN ) 500 MCG tablet Take 500 mcg by mouth every other day.    [provider]    Physical Exam: Vitals:   01/07/24 1430 01/07/24 1500 01/07/24 1515 01/07/24 1530  BP: 102/68 126/79  (!) 145/65  Pulse: (!) 143 99 (!)  104 (!) 101  Resp: (!) 33 (!) 27 (!) 21 (!) 26  Temp:      TempSrc:      SpO2: 91% 91% 91% (!) 83%  Weight:      Height:        Constitutional: NAD, calm, comfortable Vitals:   01/07/24 1430 01/07/24 1500 01/07/24 1515 01/07/24 1530  BP: 102/68 126/79  (!) 145/65  Pulse: (!) 143 99 (!) 104 (!) 101  Resp: (!) 33 (!) 27 (!) 21 (!) 26  Temp:      TempSrc:      SpO2: 91% 91% 91% (!) 83%  Weight:      Height:       Eyes: PERRL, lids and conjunctivae normal ENMT: Mucous membranes are moist. Posterior pharynx clear of any exudate or lesions.Normal dentition.  Neck: normal, supple, no masses, no thyromegaly Respiratory: clear to auscultation bilaterally, no wheezing, no crackles. Normal respiratory effort. No accessory muscle use.  Cardiovascular: Irregular heart rate and tachycardia, no murmurs / rubs / gallops. No extremity edema. 2+ pedal pulses. No carotid bruits.  Abdomen: no tenderness, no masses palpated. No hepatosplenomegaly. Bowel sounds positive.  Musculoskeletal: no clubbing / cyanosis. No joint deformity upper and lower extremities. Good ROM, no contractures. Normal muscle tone.  Skin: no rashes, lesions, ulcers. No induration Neurologic: CN 2-12 grossly intact. Sensation intact, DTR normal. Strength 5/5 in all 4.  Psychiatric: Normal judgment and insight. Alert and oriented x 3. Normal mood.     Labs on Admission: I have personally reviewed following  labs and imaging studies  CBC: Recent Labs  Lab 01/07/24 1450  WBC 8.0  NEUTROABS 5.6  HGB 14.2  HCT 45.7  MCV 100.2*  PLT 111*   Basic Metabolic Panel: Recent Labs  Lab 01/07/24 1450  NA 137  K 2.8*  CL 94*  CO2 31  GLUCOSE 149*  BUN 14  CREATININE 0.67  CALCIUM  8.5*  MG 1.6*   GFR: Estimated Creatinine Clearance: 43.3 mL/min (by C-G formula based on SCr of 0.67 mg/dL). Liver Function Tests: No results for input(s): "AST", "ALT", "ALKPHOS", "BILITOT", "PROT", "ALBUMIN" in the last 168 hours. No results for input(s): "LIPASE", "AMYLASE" in the last 168 hours. No results for input(s): "AMMONIA" in the last 168 hours. Coagulation Profile: No results for input(s): "INR", "PROTIME" in the last 168 hours. Cardiac Enzymes: No results for input(s): "CKTOTAL", "CKMB", "CKMBINDEX", "TROPONINI" in the last 168 hours. BNP (last 3 results) No results for input(s): "PROBNP" in the last 8760 hours. HbA1C: No results for input(s): "HGBA1C" in the last 72 hours. CBG: No results for input(s): "GLUCAP" in the last 168 hours. Lipid Profile: No results for input(s): "CHOL", "HDL", "LDLCALC", "TRIG", "CHOLHDL", "LDLDIRECT" in the last 72 hours. Thyroid Function Tests: No results for input(s): "TSH", "T4TOTAL", "FREET4", "T3FREE", "THYROIDAB" in the last 72 hours. Anemia Panel: No results for input(s): "VITAMINB12", "FOLATE", "FERRITIN", "TIBC", "IRON ", "RETICCTPCT" in the last 72 hours. Urine analysis:    Component Value Date/Time   COLORURINE YELLOW (A) 12/07/2023 1309   APPEARANCEUR HAZY (A) 12/07/2023 1309   APPEARANCEUR Hazy (A) 05/09/2023 1300   LABSPEC 1.005 12/07/2023 1309   LABSPEC 1.005 12/29/2013 0119   PHURINE 6.0 12/07/2023 1309   GLUCOSEU NEGATIVE 12/07/2023 1309   GLUCOSEU Negative 12/29/2013 0119   HGBUR SMALL (A) 12/07/2023 1309   BILIRUBINUR NEGATIVE 12/07/2023 1309   BILIRUBINUR Negative 05/09/2023 1300   BILIRUBINUR Negative 12/29/2013 0119   KETONESUR  NEGATIVE 12/07/2023 1309   PROTEINUR  NEGATIVE 12/07/2023 1309   NITRITE NEGATIVE 12/07/2023 1309   LEUKOCYTESUR LARGE (A) 12/07/2023 1309   LEUKOCYTESUR 1+ 12/29/2013 0119    Radiological Exams on Admission: DG Chest Portable 1 View Result Date: 01/07/2024 CLINICAL DATA:  CP EXAM: PORTABLE CHEST 1 VIEW COMPARISON:  December 09, 2023 FINDINGS: The cardiomediastinal silhouette is unchanged in contour.Atherosclerotic calcifications. Trace LEFT pleural effusion. No pneumothorax. No acute pleuroparenchymal abnormality. IMPRESSION: Trace LEFT pleural effusion. Electronically Signed   By: Clancy Crimes M.D.   On: 01/07/2024 14:49    EKG: Independently reviewed.  A-fib with RVR, no acute ST changes.  Assessment/Plan Principal Problem:   Afib (HCC) Active Problems:   A-fib (HCC)  (please populate well all problems here in Problem List. (For example, if patient is on BP meds at home and you resume or decide to hold them, it is a problem that needs to be her. Same for CAD, COPD, HLD and so on)  A-fib with RVR - Likely secondary to severe hypokalemia and hypomagnesemia.  Likely side effect from Lasix . - For hypokalemia, IV and p.o. replacement, 1 g IV magnesium  ordered. - Continue Cardizem drip - Continue Eliquis  - Resume digoxin  tomorrow, waiting for Dig level  History of HFpEF HTN -Plan to hold off Coreg  and ARB while patient on Cardizem drip - Clinically patient appears to be euvolemic, hold off p.o. Lasix .  Reevaluate indication for long-term Lasix  usage on discharge, as echo showed patient has a normal LVEF.  Anxiety/depression - Continue Prozac and trazodone   IIDM - A1c= 6.1, continue diet control   DVT prophylaxis: Eliquis  Code Status: DNR Family Communication: Daughter over the phone Disposition Plan: Expect less than 2 midnight hospital stay Consults called: None Admission status: PCU observation   [place] MD [facility] Pager [phone]  01/07/2024, 4:09 PM

## 2024-01-08 ENCOUNTER — Observation Stay

## 2024-01-08 DIAGNOSIS — I480 Paroxysmal atrial fibrillation: Secondary | ICD-10-CM | POA: Diagnosis not present

## 2024-01-08 DIAGNOSIS — I4891 Unspecified atrial fibrillation: Secondary | ICD-10-CM | POA: Diagnosis present

## 2024-01-08 LAB — BASIC METABOLIC PANEL WITH GFR
Anion gap: 8 (ref 5–15)
BUN: 12 mg/dL (ref 8–23)
CO2: 34 mmol/L — ABNORMAL HIGH (ref 22–32)
Calcium: 8.7 mg/dL — ABNORMAL LOW (ref 8.9–10.3)
Chloride: 98 mmol/L (ref 98–111)
Creatinine, Ser: 0.57 mg/dL (ref 0.44–1.00)
GFR, Estimated: >60 mL/min (ref >60)
Glucose, Bld: 136 mg/dL — ABNORMAL HIGH (ref 70–99)
Potassium: 3.6 mmol/L (ref 3.5–5.1)
Sodium: 140 mmol/L (ref 135–145)

## 2024-01-08 LAB — GLUCOSE, CAPILLARY: Glucose-Capillary: 125 mg/dL — ABNORMAL HIGH (ref 70–99)

## 2024-01-08 LAB — DIGOXIN LEVEL: Digoxin Level: 0.6 ng/mL — ABNORMAL LOW (ref 0.8–2.0)

## 2024-01-08 MED ORDER — ENSURE ENLIVE PO LIQD
237.0000 mL | Freq: Two times a day (BID) | ORAL | Status: DC
  Administered 2024-01-09: 237 mL via ORAL

## 2024-01-08 MED ORDER — CARVEDILOL 25 MG PO TABS
25.0000 mg | ORAL_TABLET | Freq: Two times a day (BID) | ORAL | Status: DC
  Administered 2024-01-08 – 2024-01-09 (×3): 25 mg via ORAL
  Filled 2024-01-08: qty 4
  Filled 2024-01-08 (×4): qty 1

## 2024-01-08 NOTE — ED Notes (Signed)
 Assisted pt to bedside commode at this time with help from NT Alexia. Pt gait slightly unsteady when turning. 2 person assist provided. Pt urinated at this time.

## 2024-01-08 NOTE — Progress Notes (Signed)
 PROGRESS NOTE    Mary [name]  ZOX:[id number] DOB: [birth year] DOA: 01/07/2024 PCP: Monique Ano, MD    Brief Narrative:  89 y.o. female with medical history significant of PAF on Eliquis , HTN, CAD, asthma/COPD, chronic HFpEF, hypothyroidism, presented with palpitations.   Patient reported that she has been bothered by worsening of neck pain recently, worsening with turning of head.  This morning, she started to have a strong feeling of palpitations and decided to come to ED.  Denies any chest pain no shortness of breath no cough, no diarrhea.  Started on dilt infusion for rapid afib  Assessment & Plan:   Principal Problem:   Afib (HCC) Active Problems:   A-fib (HCC)   Atrial fibrillation with rapid ventricular response (HCC)   A-fib with RVR - Likely secondary to severe hypokalemia and hypomagnesemia.  Likely side effect from Lasix . Plan: Continue cardizem gtt, wean as tolerated Goal HR<110 Start PO coreg  at half home dose (25mg  BID) Continue Eliquis  Continue digoxin  Telemetry monitoring  Hypokalemia Hypomagnesemia Likely 2/2 aggressive diuresis Lasix  currently held Monitor volume status   History of HFpEF HTN Coreg  resumed to help wean off gtt Hold lasix  for now, restart as appropriate  Anxiety/depression - Continue Prozac and trazodone    IIDM - A1c= 6.1, continue diet control   DVT prophylaxis: Eliquis  Code Status: DNR Family Communication:None today Disposition Plan: Status is: Observation The patient will require care spanning > 2 midnights and should be moved to inpatient because: Rate control for rapid afib   Level of care: Telemetry Cardiac  Consultants:  None  Procedures:  None  Antimicrobials: None    Subjective: Seen and examined.  Feeling better than on arrival  Objective: Vitals:   01/08/24 1200 01/08/24 1226 01/08/24 1230 01/08/24 1300  BP: (!) 142/68  138/88 (!) 150/91  Pulse: 68  62 71  Resp:      Temp:  98 F (36.7  C)    TempSrc:  Oral    SpO2: 99%  100% 100%  Weight:      Height:        Intake/Output Summary (Last 24 hours) at 01/08/2024 1400 Last data filed at 01/07/2024 1955 Gross per 24 hour  Intake 200 ml  Output --  Net 200 ml   Filed Weights   01/07/24 1412  Weight: 67.5 kg    Examination:  General exam: Appears calm and comfortable  Respiratory system: Clear to auscultation. Respiratory effort normal. Cardiovascular system: S1S2, tachy, irreg rhythm, no murmur Gastrointestinal system: Soft NTND, normal BS Central nervous system: Alert and oriented. No focal neurological deficits. Extremities: Symmetric 5 x 5 power. Skin: No rashes, lesions or ulcers Psychiatry: Judgement and insight appear normal. Mood & affect appropriate.     Data Reviewed: I have personally reviewed following labs and imaging studies  CBC: Recent Labs  Lab 01/07/24 1450  WBC 8.0  NEUTROABS 5.6  HGB 14.2  HCT 45.7  MCV 100.2*  PLT 111*   Basic Metabolic Panel: Recent Labs  Lab 01/07/24 1450 01/07/24 2252 01/08/24 0506  NA 137  --  140  K 2.8*  --  3.6  CL 94*  --  98  CO2 31  --  34*  GLUCOSE 149*  --  136*  BUN 14  --  12  CREATININE 0.67  --  0.57  CALCIUM  8.5*  --  8.7*  MG 1.6*  --   --   PHOS  --  1.8*  --    GFR:  Estimated Creatinine Clearance: 43.3 mL/min (by C-G formula based on SCr of 0.57 mg/dL). Liver Function Tests: No results for input(s): "AST", "ALT", "ALKPHOS", "BILITOT", "PROT", "ALBUMIN" in the last 168 hours. No results for input(s): "LIPASE", "AMYLASE" in the last 168 hours. No results for input(s): "AMMONIA" in the last 168 hours. Coagulation Profile: No results for input(s): "INR", "PROTIME" in the last 168 hours. Cardiac Enzymes: No results for input(s): "CKTOTAL", "CKMB", "CKMBINDEX", "TROPONINI" in the last 168 hours. BNP (last 3 results) No results for input(s): "PROBNP" in the last 8760 hours. HbA1C: No results for input(s): "HGBA1C" in the last 72  hours. CBG: No results for input(s): "GLUCAP" in the last 168 hours. Lipid Profile: No results for input(s): "CHOL", "HDL", "LDLCALC", "TRIG", "CHOLHDL", "LDLDIRECT" in the last 72 hours. Thyroid Function Tests: No results for input(s): "TSH", "T4TOTAL", "FREET4", "T3FREE", "THYROIDAB" in the last 72 hours. Anemia Panel: No results for input(s): "VITAMINB12", "FOLATE", "FERRITIN", "TIBC", "IRON ", "RETICCTPCT" in the last 72 hours. Sepsis Labs: No results for input(s): "PROCALCITON", "LATICACIDVEN" in the last 168 hours.  No results found for this or any previous visit (from the past 240 hours).       Radiology Studies: DG Chest 1 View Result Date: 01/08/2024 CLINICAL DATA:  Congestive heart failure. EXAM: CHEST  1 VIEW COMPARISON:  01/07/2024 FINDINGS: Low volume film. The cardio pericardial silhouette is enlarged. There is pulmonary vascular congestion without overt pulmonary edema. Trace blunting of the left costophrenic angle raises the question of tiny effusion. No acute bony abnormality. Telemetry leads overlie the chest. IMPRESSION: Low volume film with pulmonary vascular congestion. Possible tiny left pleural effusion. Electronically Signed   By: Donnal Fusi M.D.   On: 01/08/2024 07:22   DG Cervical Spine 2 or 3 views Result Date: 01/07/2024 CLINICAL DATA:  [id number] Neck pain [id number] EXAM: CERVICAL SPINE - 2-3 VIEW COMPARISON:  December 07, 2023. FINDINGS: The cervical spine is visualized from C1-C7. Straightening of the cervical lordosis without significant spondylolisthesis. Vertebral body heights are mantle maintained: No evidence of acute fracture. Moderate multilevel intervertebral disc space height loss throughout the cervical spine most pronounced at C4-5, C5-6 and C6-7. Multilevel facet arthropathy and uncovertebral hypertrophy. Limited assessment of the atlantooccipital interval due to overlapping tissue. No prevertebral soft tissue swelling. Prominent contour of the LEFT superior  hilum. IMPRESSION: 1. Moderate multilevel degenerative changes of the cervical spine. 2. There is a prominent contour of the LEFT superior hilum. This could reflect a similar prominent pulmonary artery, the lymphadenopathy or space-occupying lesion remains in the differential. Consider further evaluation with dedicated chest CT if clinically indicated. Electronically Signed   By: Clancy Crimes M.D.   On: 01/07/2024 16:44   DG Chest Portable 1 View Result Date: 01/07/2024 CLINICAL DATA:  CP EXAM: PORTABLE CHEST 1 VIEW COMPARISON:  December 09, 2023 FINDINGS: The cardiomediastinal silhouette is unchanged in contour.Atherosclerotic calcifications. Trace LEFT pleural effusion. No pneumothorax. No acute pleuroparenchymal abnormality. IMPRESSION: Trace LEFT pleural effusion. Electronically Signed   By: Clancy Crimes M.D.   On: 01/07/2024 14:49        Scheduled Meds:  apixaban   5 mg Oral BID   busPIRone   10 mg Oral BID   carvedilol   25 mg Oral BID WC   digoxin   0.125 mg Oral Daily   ketotifen   1 drop Both Eyes BID   levothyroxine   75 mcg Oral Daily   metFORMIN   500 mg Oral Daily   pantoprazole   20 mg Oral Daily   PARoxetine   40 mg Oral QPM   rosuvastatin   5 mg Oral Daily   sodium chloride  flush  3 mL Intravenous Q12H   Continuous Infusions:  sodium chloride      diltiazem (CARDIZEM) infusion Stopped (01/08/24 1156)     LOS: 0 days     Tiajuana Fluke, MD [facility]   If 7PM-7AM, please contact night-coverage  01/08/2024, 2:00 PM

## 2024-01-08 NOTE — ED Notes (Signed)
 Pt resting in bed. Appears to be sleeping at this time. Chest is rising and falling symmetrically. No acute distress noted. Call light within reach.

## 2024-01-08 NOTE — ED Notes (Signed)
 Advised nurse that patient has ready bed

## 2024-01-08 NOTE — TOC Initial Note (Signed)
 Transition of Care [facility]) - Initial/Assessment Note    Patient Details  Name: Mary [name] MRN: [id number] Date of Birth: [birth year]  Transition of Care [facility]) CM/SW Contact:    [place] L Korynne Dols, LCSW Phone Number: 01/08/2024, 2:35 PM  Clinical Narrative:                    CSW spoke with with patient in the ED. Patient advised that she resides at home with her adult daughter, Mary [name]. Patient stated that she is declining recommendations for SNF. She stated that she tried to go to [facility] in [place] but she did not like the facility of care. [facility] She reports utilizing a walker at home. She stated that she has no preference for [facility] at this time.      Patient Goals and CMS Choice            Expected Discharge Plan and Services       Living arrangements for the past 2 months: Single Family Home                                      Prior Living Arrangements/Services Living arrangements for the past 2 months: Single Family Home Lives with:: Adult Children Mary [name]) Patient language and need for interpreter reviewed:: Yes (English) Do you feel safe going back to the place where you live?: Yes               Activities of Daily Living      Permission Sought/Granted   Permission granted to share information with : Yes, Verbal Permission Granted  Share Information with NAME: Mary [name]           Emotional Assessment Appearance:: Appears stated age   Affect (typically observed): Accepting Orientation: : Oriented to Place, Oriented to Self Alcohol  / Substance Use: Never Used Psych Involvement: No (comment)  Admission diagnosis:  Afib (HCC) [I48.91] Atrial fibrillation with rapid ventricular response (HCC) [I48.91] Patient Active Problem List   Diagnosis Date Noted   Atrial fibrillation with rapid ventricular response (HCC) 01/08/2024   Afib (HCC) 01/07/2024   A-fib (HCC) 12/07/2023   CHF (congestive heart failure) (HCC)  12/07/2023   Left flank pain 07/25/2023   Hydronephrosis of left kidney 07/25/2023   Hypokalemia 07/24/2023   UTI (urinary tract infection) 07/23/2023   Renal artery stenosis (HCC) 11/12/2022   Hypertension, renovascular 11/12/2022   Osteoarthritis of carpometacarpal (CMC) joint of thumb 01/19/2022   Type 2 diabetes mellitus with hyperlipidemia (HCC) 01/01/2022   Carotid stenosis, right 12/02/2021   Acute on chronic diastolic CHF (congestive heart failure) (HCC) 09/25/2021   IDA (iron  deficiency anemia) 02/02/2021   Atherosclerosis of abdominal aorta (HCC) 03/19/2020   Coronary artery disease involving native coronary artery of native heart 03/19/2020   SOBOE (shortness of breath on exertion) 03/19/2020   Asthma    Acute respiratory failure with hypoxia (HCC)    Hypertensive retinopathy, bilateral 06/08/2019   Mild nonproliferative diabetic retinopathy associated with type 2 diabetes mellitus (HCC) 06/08/2019   Type 2 diabetes mellitus with diabetic retinopathy (HCC) 05/31/2019   Branch retinal vein occlusion of right eye 05/23/2019   Hx of diabetic retinopathy 05/23/2019   Carotid stenosis 01/18/2019   History of fracture of left hip 10/26/2018   Hip fracture (HCC) 09/28/2018   Dizziness 09/28/2018   History of normocytic normochromic anemia 06/13/2018  Psychophysiological insomnia 06/13/2018   Status post total knee replacement using cement, left 12/01/2017   Synovial cyst of left popliteal space 05/27/2017   Hypertensive urgency 05/19/2017   Diabetic polyneuropathy associated with type 2 diabetes mellitus (HCC) 03/30/2017   Anxiety 05/19/2016   Insomnia 05/19/2016   Pure hypercholesterolemia 05/19/2016   Personal history of disease 03/11/2016   Status post hip hemiarthroplasty 10/10/2015   Hypoxia 09/26/2015   Hypoxemia 09/26/2015   Fracture of femoral neck, right (HCC) 09/23/2015   Diabetes mellitus without complication (HCC) 09/23/2015   Uncontrolled hypertension  09/23/2015   HLD (hyperlipidemia) 09/23/2015   GERD (gastroesophageal reflux disease) 09/23/2015   Depression 09/23/2015   Hypothyroidism 09/23/2015   DDD (degenerative disc disease), lumbar 08/26/2015   Lumbar stenosis with neurogenic claudication 08/26/2015   Nocturia 04/29/2015   Urinary frequency 04/29/2015   Atrophic vaginitis 04/29/2015   Mixed incontinence 06/28/2014   Lumbar radiculopathy 02/08/2013   PCP:  Monique Ano, MD Pharmacy:   [facility] - [place], [place] - [address] [place] [address] Phone: [phone] Fax: [phone]     Social Drivers of Health (SDOH) Social History: SDOH Screenings   Food Insecurity: No Food Insecurity (12/07/2023)  Housing: Low Risk  (01/05/2024)   Received from [facility]  Transportation Needs: No Transportation Needs (12/07/2023)  Utilities: Not At Risk (12/07/2023)  Financial Resource Strain: Low Risk  (09/28/2023)   Received from [facility]  Social Connections: Moderately Isolated (12/07/2023)  Tobacco Use: Low Risk  (01/07/2024)   SDOH Interventions:     Readmission Risk Interventions     No data to display

## 2024-01-08 NOTE — Care Management Obs Status (Signed)
 MEDICARE OBSERVATION STATUS NOTIFICATION   Patient Details  Name: Mary [name] MRN: [id number] Date of Birth: Nov 28, 1934   Medicare Observation Status Notification Given:  Yes    [place] L Bobby Barton, LCSW 01/08/2024, 2:15 PM

## 2024-01-09 DIAGNOSIS — I480 Paroxysmal atrial fibrillation: Secondary | ICD-10-CM | POA: Diagnosis not present

## 2024-01-09 LAB — CBC WITH DIFFERENTIAL/PLATELET
Abs Immature Granulocytes: 0.01 10*3/uL (ref 0.00–0.07)
Basophils Absolute: 0 10*3/uL (ref 0.0–0.1)
Basophils Relative: 1 %
Eosinophils Absolute: 0.4 10*3/uL (ref 0.0–0.5)
Eosinophils Relative: 7 %
HCT: 44.2 % (ref 36.0–46.0)
Hemoglobin: 14 g/dL (ref 12.0–15.0)
Immature Granulocytes: 0 %
Lymphocytes Relative: 23 %
Lymphs Abs: 1.2 10*3/uL (ref 0.7–4.0)
MCH: 31.4 pg (ref 26.0–34.0)
MCHC: 31.7 g/dL (ref 30.0–36.0)
MCV: 99.1 fL (ref 80.0–100.0)
Monocytes Absolute: 0.5 10*3/uL (ref 0.1–1.0)
Monocytes Relative: 10 %
Neutro Abs: 3.1 10*3/uL (ref 1.7–7.7)
Neutrophils Relative %: 59 %
Platelets: 100 10*3/uL — ABNORMAL LOW (ref 150–400)
RBC: 4.46 MIL/uL (ref 3.87–5.11)
RDW: 13.3 % (ref 11.5–15.5)
WBC: 5.3 10*3/uL (ref 4.0–10.5)
nRBC: 0 % (ref 0.0–0.2)

## 2024-01-09 LAB — BASIC METABOLIC PANEL WITH GFR
Anion gap: 7 (ref 5–15)
BUN: 14 mg/dL (ref 8–23)
CO2: 34 mmol/L — ABNORMAL HIGH (ref 22–32)
Calcium: 9.7 mg/dL (ref 8.9–10.3)
Chloride: 99 mmol/L (ref 98–111)
Creatinine, Ser: 0.53 mg/dL (ref 0.44–1.00)
GFR, Estimated: >60 mL/min (ref >60)
Glucose, Bld: 120 mg/dL — ABNORMAL HIGH (ref 70–99)
Potassium: 3.9 mmol/L (ref 3.5–5.1)
Sodium: 140 mmol/L (ref 135–145)

## 2024-01-09 LAB — PHOSPHORUS: Phosphorus: 2.5 mg/dL (ref 2.5–4.6)

## 2024-01-09 LAB — MAGNESIUM: Magnesium: 1.7 mg/dL (ref 1.7–2.4)

## 2024-01-09 MED ORDER — FUROSEMIDE 40 MG PO TABS: 40.0000 mg | ORAL_TABLET | Freq: Every day | ORAL | Status: DC

## 2024-01-09 MED ORDER — CARVEDILOL 25 MG PO TABS
50.0000 mg | ORAL_TABLET | Freq: Two times a day (BID) | ORAL | Status: DC
  Administered 2024-01-09: 50 mg via ORAL

## 2024-01-09 NOTE — TOC Transition Note (Signed)
 Transition of Care [facility]) - Discharge Note   Patient Details  Name: Mary [name] MRN: [id number] Date of Birth: 11/27/1934  Transition of Care [facility]) CM/SW Contact:  Shakiah Wester C Jarid Sasso, RN Phone Number: 01/09/2024, 1:35 PM   Clinical Narrative:    Spoke with patient regarding discharge today. She request RNCM speak with her daughter   Eldora Greet with [name], regarding discharge home today. She stated Fredrik Jensen, family friend will transport patient home today. She is agreeable to RW and was advised it will be delivered to patient's room via [employer]. [name] stated [name] was arranged for patient on this past Friday. She was advised they would contact her to scheduled SOC.    Calvin from [place] contacted to advised patient is discharging home today.   TOC signing off.           Patient Goals and CMS Choice            Discharge Placement                       Discharge Plan and Services Additional resources added to the After Visit Summary for                                       Social Drivers of Health (SDOH) Interventions SDOH Screenings   Food Insecurity: No Food Insecurity (01/08/2024)  Housing: Low Risk  (01/08/2024)  Transportation Needs: No Transportation Needs (01/08/2024)  Utilities: Not At Risk (01/08/2024)  Financial Resource Strain: Low Risk  (09/28/2023)   Received from [facility]  Social Connections: Moderately Isolated (01/08/2024)  Tobacco Use: Low Risk  (01/07/2024)     Readmission Risk Interventions     No data to display

## 2024-01-09 NOTE — Discharge Summary (Signed)
 Physician Discharge Summary  Mary [name] WGN:[id number] DOB: Mar 06, 1935 DOA: 01/07/2024  PCP: Monique Ano, MD  Admit date: 01/07/2024 Discharge date: 01/09/2024  Admitted From: Home Disposition:  Home with home  Recommendations for Outpatient Follow-up:  Follow up with PCP in 1-2 weeks Follow up with cardiology 1-2 weeks  Home Health:Yes PT OT  Equipment/Devices: None   Discharge Condition:Stable  CODE STATUS:FULL  Diet recommendation: Heart  Brief/Interim Summary:  89 y.o. female with medical history significant of PAF on Eliquis , HTN, CAD, asthma/COPD, chronic HFpEF, hypothyroidism, presented with palpitations.   Patient reported that she has been bothered by worsening of neck pain recently, worsening with turning of head.  This morning, she started to have a strong feeling of palpitations and decided to come to ED.  Denies any chest pain no shortness of breath no cough, no diarrhea.   Started on dilt infusion for rapid afib..  Weaned off gtt.  Home regimen restarted.  Electrolytes optimized.  Patient remained stable on home regimen with optimize electrolytes.  Suspicion that hypokalemia and hypomagnesemia resulted in rapid rate.  Once electrolytes corrected then heart rate improved substantially.  Stable for discharge home.  Home health PT and OT ordered.   Discharge Diagnoses:  Principal Problem:   Afib (HCC) Active Problems:   A-fib (HCC)   Atrial fibrillation with rapid ventricular response (HCC)  A-fib with RVR - Likely secondary to severe hypokalemia and hypomagnesemia.   Plan: Resume home Coreg  50 mg twice daily.  Resume home digoxin  0.125 mg daily.  Reduce dose of Lasix  to 40 mg once daily.  Remainder of home medications unchanged.  Follow-up outpatient PCP and cardiology 1 to 2 weeks.  Discharge Instructions  Discharge Instructions     Diet - low sodium heart healthy   Complete by: As directed    Increase activity slowly   Complete by: As directed        Allergies as of 01/09/2024       Reactions   Cephalexin Hives   Nitrofurantoin    Other reaction(s): Other (See Comments) Other Reaction: measles-like lesions   Sulfa Antibiotics Hives   Atorvastatin Other (See Comments)   Muscle spasms/ muscle pain        Medication List     TAKE these medications    acetaminophen  500 MG tablet Commonly known as: TYLENOL  Take 500 mg by mouth every 6 (six) hours as needed for mild pain (pain score 1-3).   apixaban  5 MG Tabs tablet Commonly known as: ELIQUIS  Take 1 tablet (5 mg total) by mouth 2 (two) times daily.   azelastine 0.05 % ophthalmic solution Commonly known as: OPTIVAR Place 1 drop into both eyes daily as needed (allergies).   busPIRone  10 MG tablet Commonly known as: BUSPAR  Take 10 mg by mouth 2 (two) times daily.   carvedilol  25 MG tablet Commonly known as: COREG  Take 2 tablets (50 mg total) by mouth 2 (two) times daily with a meal.   cyclobenzaprine  5 MG tablet Commonly known as: FLEXERIL  Take 1 tablet (5 mg total) by mouth 3 (three) times daily as needed for muscle spasms.   digoxin  0.125 MG tablet Commonly known as: LANOXIN  Take 1 tablet (0.125 mg total) by mouth daily.   furosemide  40 MG tablet Commonly known as: LASIX  Take 1 tablet (40 mg total) by mouth daily. What changed:  when to take this Another medication with the same name was removed. Continue taking this medication, and follow the directions you see here.  Gemtesa  75 MG Tabs Generic drug: Vibegron  Take 1 tablet (75 mg total) by mouth daily at 6 (six) AM.   irbesartan  150 MG tablet Commonly known as: AVAPRO  Take 1 tablet (150 mg total) by mouth daily.   levothyroxine  75 MCG tablet Commonly known as: SYNTHROID  Take 75 mcg by mouth daily.   metFORMIN  500 MG 24 hr tablet Commonly known as: GLUCOPHAGE -XR Take 500 mg by mouth daily. With dinner   pantoprazole  20 MG tablet Commonly known as: PROTONIX  Take 20 mg by mouth daily.    PARoxetine  40 MG tablet Commonly known as: PAXIL  Take 40 mg by mouth every evening.   potassium chloride  SA 20 MEQ tablet Commonly known as: KLOR-CON  M Take 20 mEq by mouth 2 (two) times daily.  Take 1 tablet (20 mEq total) by mouth 2 (two) times daily With lasix    rosuvastatin  5 MG tablet Commonly known as: CRESTOR  Take 1 tablet (5 mg total) by mouth daily.   traZODone  50 MG tablet Commonly known as: DESYREL  Take 0.5 tablets (25 mg total) by mouth at bedtime as needed for sleep.   vitamin B-12 500 MCG tablet Commonly known as: CYANOCOBALAMIN  Take 500 mcg by mouth every other day.   Vitron-C 65-125 MG Tabs Generic drug: Iron -Vitamin C  Take 1 tablet by mouth daily.        Follow-up Information     Monique Ano, MD. Schedule an appointment as soon as possible for a visit in 1 week(s).   Specialty: Family Medicine Contact information: [address] [place] [address] [phone]         Janette Medley, MD. Schedule an appointment as soon as possible for a visit in 2 week(s).   Specialty: Cardiology Contact information: [address] [place] [address] [phone]                Allergies  Allergen Reactions   Cephalexin Hives   Nitrofurantoin     Other reaction(s): Other (See Comments) Other Reaction: measles-like lesions   Sulfa Antibiotics Hives   Atorvastatin Other (See Comments)    Muscle spasms/ muscle pain    Consultations: None   Procedures/Studies: DG Chest 1 View Result Date: 01/08/2024 CLINICAL DATA:  Congestive heart failure. EXAM: CHEST  1 VIEW COMPARISON:  01/07/2024 FINDINGS: Low volume film. The cardio pericardial silhouette is enlarged. There is pulmonary vascular congestion without overt pulmonary edema. Trace blunting of the left costophrenic angle raises the question of tiny effusion. No acute bony abnormality. Telemetry leads overlie the chest. IMPRESSION: Low volume film with pulmonary vascular  congestion. Possible tiny left pleural effusion. Electronically Signed   By: Donnal Fusi M.D.   On: 01/08/2024 07:22   DG Cervical Spine 2 or 3 views Result Date: 01/07/2024 CLINICAL DATA:  [id number] Neck pain 96295 EXAM: CERVICAL SPINE - 2-3 VIEW COMPARISON:  December 07, 2023. FINDINGS: The cervical spine is visualized from C1-C7. Straightening of the cervical lordosis without significant spondylolisthesis. Vertebral body heights are mantle maintained: No evidence of acute fracture. Moderate multilevel intervertebral disc space height loss throughout the cervical spine most pronounced at C4-5, C5-6 and C6-7. Multilevel facet arthropathy and uncovertebral hypertrophy. Limited assessment of the atlantooccipital interval due to overlapping tissue. No prevertebral soft tissue swelling. Prominent contour of the LEFT superior hilum. IMPRESSION: 1. Moderate multilevel degenerative changes of the cervical spine. 2. There is a prominent contour of the LEFT superior hilum. This could reflect a similar prominent pulmonary artery, the lymphadenopathy or space-occupying lesion remains  in the differential. Consider further evaluation with dedicated chest CT if clinically indicated. Electronically Signed   By: Clancy Crimes M.D.   On: 01/07/2024 16:44   DG Chest Portable 1 View Result Date: 01/07/2024 CLINICAL DATA:  CP EXAM: PORTABLE CHEST 1 VIEW COMPARISON:  December 09, 2023 FINDINGS: The cardiomediastinal silhouette is unchanged in contour.Atherosclerotic calcifications. Trace LEFT pleural effusion. No pneumothorax. No acute pleuroparenchymal abnormality. IMPRESSION: Trace LEFT pleural effusion. Electronically Signed   By: Clancy Crimes M.D.   On: 01/07/2024 14:49      Subjective: Seen and examined on the day of discharge.  Stable no distress.  Appropriate for discharge home.  Discharge Exam: Vitals:   01/09/24 1314 01/09/24 1315  BP:    Pulse:    Resp: 18 13  Temp:    SpO2:     Vitals:   01/09/24  1312 01/09/24 1313 01/09/24 1314 01/09/24 1315  BP:      Pulse:      Resp: (!) 32 14 18 13   Temp:      TempSrc:      SpO2:      Weight:      Height:        General: Pt is alert, awake, not in acute distress Cardiovascular: RRR, S1/S2 +, no rubs, no gallops Respiratory: CTA bilaterally, no wheezing, no rhonchi Abdominal: Soft, NT, ND, bowel sounds + Extremities: no edema, no cyanosis    The results of significant diagnostics from this hospitalization (including imaging, microbiology, ancillary and laboratory) are listed below for reference.     Microbiology: No results found for this or any previous visit (from the past 240 hours).   Labs: BNP (last 3 results) Recent Labs    12/07/23 1309 01/07/24 1450  BNP 520.4* 283.6*   Basic Metabolic Panel: Recent Labs  Lab 01/07/24 1450 01/07/24 2252 01/08/24 0506 01/09/24 0817  NA 137  --  140 140  K 2.8*  --  3.6 3.9  CL 94*  --  98 99  CO2 31  --  34* 34*  GLUCOSE 149*  --  136* 120*  BUN 14  --  12 14  CREATININE 0.67  --  0.57 0.53  CALCIUM  8.5*  --  8.7* 9.7  MG 1.6*  --   --  1.7  PHOS  --  1.8*  --  2.5   Liver Function Tests: No results for input(s): "AST", "ALT", "ALKPHOS", "BILITOT", "PROT", "ALBUMIN" in the last 168 hours. No results for input(s): "LIPASE", "AMYLASE" in the last 168 hours. No results for input(s): "AMMONIA" in the last 168 hours. CBC: Recent Labs  Lab 01/07/24 1450 01/09/24 0817  WBC 8.0 5.3  NEUTROABS 5.6 3.1  HGB 14.2 14.0  HCT 45.7 44.2  MCV 100.2* 99.1  PLT 111* 100*   Cardiac Enzymes: No results for input(s): "CKTOTAL", "CKMB", "CKMBINDEX", "TROPONINI" in the last 168 hours. BNP: Invalid input(s): "POCBNP" CBG: Recent Labs  Lab 01/08/24 1646  GLUCAP 125*   D-Dimer No results for input(s): "DDIMER" in the last 72 hours. Hgb A1c No results for input(s): "HGBA1C" in the last 72 hours. Lipid Profile No results for input(s): "CHOL", "HDL", "LDLCALC", "TRIG", "CHOLHDL",  "LDLDIRECT" in the last 72 hours. Thyroid function studies No results for input(s): "TSH", "T4TOTAL", "T3FREE", "THYROIDAB" in the last 72 hours.  Invalid input(s): "FREET3" Anemia work up No results for input(s): "VITAMINB12", "FOLATE", "FERRITIN", "TIBC", "IRON ", "RETICCTPCT" in the last 72 hours. Urinalysis    Component Value Date/Time  COLORURINE YELLOW (A) 12/07/2023 1309   APPEARANCEUR HAZY (A) 12/07/2023 1309   APPEARANCEUR Hazy (A) 05/09/2023 1300   LABSPEC 1.005 12/07/2023 1309   LABSPEC 1.005 12/29/2013 0119   PHURINE 6.0 12/07/2023 1309   GLUCOSEU NEGATIVE 12/07/2023 1309   GLUCOSEU Negative 12/29/2013 0119   HGBUR SMALL (A) 12/07/2023 1309   BILIRUBINUR NEGATIVE 12/07/2023 1309   BILIRUBINUR Negative 05/09/2023 1300   BILIRUBINUR Negative 12/29/2013 0119   KETONESUR NEGATIVE 12/07/2023 1309   PROTEINUR NEGATIVE 12/07/2023 1309   NITRITE NEGATIVE 12/07/2023 1309   LEUKOCYTESUR LARGE (A) 12/07/2023 1309   LEUKOCYTESUR 1+ 12/29/2013 0119   Sepsis Labs Recent Labs  Lab 01/07/24 1450 01/09/24 0817  WBC 8.0 5.3   Microbiology No results found for this or any previous visit (from the past 240 hours).   Time coordinating discharge: Over 30 minutes  SIGNED:   Tiajuana Fluke, MD  [facility] 01/09/2024, 1:27 PM Pager   If 7PM-7AM, please contact night-coverage

## 2024-01-09 NOTE — Progress Notes (Signed)
 Heart Failure Navigator Progress Note  Assessed for Heart & Vascular TOC clinic readiness.  Does not meet criteria due to [facility] patient.   Navigator will sign off at this time.  Roxy Horseman, RN, BSN [facility] Heart Failure Navigator Secure Chat Only

## 2024-01-09 NOTE — Evaluation (Signed)
 Occupational Therapy Evaluation Patient Details Name: Mary [name] MRN: [id number] DOB: 12/21/1934 Today's Date: 01/09/2024   History of Present Illness   Pt is an 89 y.o. female admitted for AFIB with RVR, hypokalemia, & hypomagnesemia. PMH significant for PAF on Eliquis , HTN, CAD, asthma/COPD, chronic HFpEF, HLD, RA, sleep apnea, spinal stenosis, recurrent nosebleed and lumbar DDD. Recently returned home from STR 5 days ago.     Clinical Impressions Prior to hospital admission, pt was mod independent for ADLs, primary caregiver to her daughter, and uses RW for mobility with no falls hx. Pt lives with daughter, and daughter's PCA assists with IADLs such as grocery shopping. Pt currently requires supervision for bed mobility, setup for seated grooming tasks in recliner, and MIN A for functional transfers + mobility t/f bathroom for toileting needs, MIN A for clothing management and hygiene. HR increasing to 145 bpm - MD and RN notified. Presents with impaired balance, strength and activity tolerance. Pt would benefit from skilled OT services to address noted impairments and functional limitations (see below for any additional details) in order to maximize safety and independence while minimizing falls risk and caregiver burden. Anticipate the need for follow up [facility] OT services upon acute hospital DC.      If plan is discharge home, recommend the following:   A little help with walking and/or transfers;A little help with bathing/dressing/bathroom;Assistance with cooking/housework;Direct supervision/assist for medications management;Direct supervision/assist for financial management;Assist for transportation;Help with stairs or ramp for entrance     Functional Status Assessment   Patient has had a recent decline in their functional status and demonstrates the ability to make significant improvements in function in a reasonable and predictable amount of time.     Equipment  Recommendations   BSC/3in1      Precautions/Restrictions   Precautions Precautions: Fall Restrictions Weight Bearing Restrictions Per Provider Order: No     Mobility Bed Mobility Overal bed mobility: Needs Assistance Bed Mobility: Supine to Sit     Supine to sit: Used rails, HOB elevated, Supervision     General bed mobility comments: increased time, no physical assist needed    Transfers Overall transfer level: Needs assistance Equipment used: Rolling walker (2 wheels) Transfers: Sit to/from Stand, Bed to chair/wheelchair/BSC Sit to Stand: Min assist     Step pivot transfers: Min assist     General transfer comment: minA to rise from multiple surface levels      Balance Overall balance assessment: Needs assistance Sitting-balance support: No upper extremity supported, Feet supported Sitting balance-Leahy Scale: Good     Standing balance support: Bilateral upper extremity supported Standing balance-Leahy Scale: Fair                             ADL either performed or assessed with clinical judgement   ADL Overall ADL's : Needs assistance/impaired     Grooming: Set up;Wash/dry face;Wash/dry hands;Sitting Grooming Details (indicate cue type and reason): in recliner                 Toilet Transfer: Minimal assistance;Regular Toilet;Rolling walker (2 wheels);Ambulation Toilet Transfer Details (indicate cue type and reason): cues for safety and RW proximity; requires MIN A to rise from standard height commode as pt unable to achive after several attempts Toileting- Clothing Manipulation and Hygiene: Minimal assistance;Sit to/from stand Toileting - Clothing Manipulation Details (indicate cue type and reason): MIN A to pull pants over hips due to decreased balance  Functional mobility during ADLs: Contact guard assist;Rolling walker (2 wheels) General ADL Comments: HR elevated with toileting tasks, pt states she feels near her baseline      Vision Baseline Vision/History: 1 Wears glasses Ability to See in Adequate Light: 0 Adequate Vision Assessment?: Wears glasses for reading            Pertinent Vitals/Pain Pain Assessment Pain Assessment: No/denies pain     Extremity/Trunk Assessment Upper Extremity Assessment Upper Extremity Assessment: Defer to OT evaluation   Lower Extremity Assessment Lower Extremity Assessment: Generalized weakness   Cervical / Trunk Assessment Cervical / Trunk Assessment: Kyphotic   Communication Communication Communication: No apparent difficulties   Cognition Arousal: Alert Behavior During Therapy: WFL for tasks assessed/performed Cognition: No apparent impairments                               Following commands: Intact       Cueing  General Comments   Cueing Techniques: Verbal cues  pt's HR increased to 145 walking to bathroom, staying above 120 while walking. At rest, varied during session up from low 100s, avg 110-120. MD notified.           Home Living Family/patient expects to be discharged to:: Private residence Living Arrangements: Children Available Help at Discharge: Family;Available PRN/intermittently Type of Home: House Home Access: Stairs to enter Entrance Stairs-Number of Steps: 3+1 Entrance Stairs-Rails: Can reach both;Right;Left Home Layout: One level     Bathroom Shower/Tub: Producer, television/film/video: Handicapped height     Home Equipment: Pharmacist, hospital (2 wheels);Rollator (4 wheels)   Additional Comments: Pt lives with her daughter who has CP. 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      Prior Functioning/Environment Prior Level of Function : Independent/Modified Independent             Mobility Comments: 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 If aid does not come pt helps  daughter with bed mobility Pt reports she's ambulatory with hurrycane or RW, denies falls, reports she drove +1 month ago (prior to last hospitalization) ADLs Comments: Independent    OT Problem List: Decreased strength;Decreased activity tolerance;Impaired balance (sitting and/or standing);Cardiopulmonary status limiting activity   OT Treatment/Interventions: Self-care/ADL training;Energy conservation;DME and/or AE instruction;Therapeutic activities;Patient/family education;Balance training      OT Goals(Current goals can be found in the care plan section)   Acute Rehab OT Goals OT Goal Formulation: With patient Time For Goal Achievement: 01/23/24 Potential to Achieve Goals: Good   OT Frequency:  Min 2X/week       AM-PAC OT "6 Clicks" Daily Activity     Outcome Measure Help from another person eating meals?: None Help from another person taking care of personal grooming?: None Help from another person toileting, which includes using toliet, bedpan, or urinal?: A Little Help from another person bathing (including washing, rinsing, drying)?: A Little Help from another person to put on and taking off regular upper body clothing?: A Little Help from another person to put on and taking off regular lower body clothing?: A Little 6 Click Score: 20   End of Session Equipment Utilized During Treatment: Gait belt;Rolling walker (2 wheels) Nurse Communication: Mobility status;Other (comment) (HR)  Activity Tolerance: Patient tolerated treatment well Patient left: in chair;with call bell/phone  within reach;with chair alarm set  OT Visit Diagnosis: Muscle weakness (generalized) (M62.81);Other abnormalities of gait and mobility (R26.89);Unsteadiness on feet (R26.81)                Time: 0454-0981 OT Time Calculation (min): 28 min Charges:  OT General Charges $OT Visit: 1 Visit OT Evaluation $OT Eval Moderate Complexity: 1 Mod OT Treatments $Self Care/Home Management : 8-22  mins  Tomicka Lover L. Rakayla Ricklefs, OTR/L  01/09/24, 12:49 PM

## 2024-01-09 NOTE — Evaluation (Signed)
 Physical Therapy Evaluation Patient Details Name: Mary [name] MRN: [id number] DOB: 16-Feb-1935 Today's Date: 01/09/2024  History of Present Illness  Pt is an 89 y.o. female admitted for AFIB with RVR, hypokalemia, & hypomagnesemia. PMH significant for PAF on Eliquis , HTN, CAD, asthma/COPD, chronic HFpEF, HLD, RA, sleep apnea, spinal stenosis, recurrent nosebleed and lumbar DDD. Recently returned home from STR 5 days ago.  Clinical Impression  Patient admitted with the above. PTA, patient lives with daughter whom she cares for (daughter has CP - daughter has an Engineer, production) and was utilizing RW for mobility. Patient presents with weakness, impaired balance, and decreased activity tolerance. Required CGA to stand from recliner. Ambulated 30' with RW and CGA-supervision with HR up to 153 bpm. Notified MD. Patient will benefit from skilled PT services during acute stay to address listed deficits. Patient will benefit from ongoing therapy at discharge to maximize functional independence and safety.         If plan is discharge home, recommend the following: A little help with walking and/or transfers;A little help with bathing/dressing/bathroom;Assistance with cooking/housework;Assist for transportation;Help with stairs or ramp for entrance   Can travel by private vehicle        Equipment Recommendations Rolling Chrys Landgrebe (2 wheels)  Recommendations for Other Services       Functional Status Assessment Patient has had a recent decline in their functional status and demonstrates the ability to make significant improvements in function in a reasonable and predictable amount of time.     Precautions / Restrictions Precautions Precautions: Fall Restrictions Weight Bearing Restrictions Per Provider Order: No      Mobility  Bed Mobility               General bed mobility comments: in recliner on arrival    Transfers Overall transfer level: Needs assistance Equipment used: Rolling Trenda Corliss (2  wheels) Transfers: Sit to/from Stand Sit to Stand: Contact guard assist                Ambulation/Gait Ambulation/Gait assistance: Contact guard assist, Supervision Gait Distance (Feet): 30 Feet Assistive device: Rolling Oneisha Ammons (2 wheels) Gait Pattern/deviations: Step-through pattern, Decreased stride length Gait velocity: decreased     General Gait Details: HR up to 153 during mobility, denies symptoms  Stairs            Wheelchair Mobility     Tilt Bed    Modified Rankin (Stroke Patients Only)       Balance Overall balance assessment: Needs assistance Sitting-balance support: No upper extremity supported, Feet supported Sitting balance-Leahy Scale: Good     Standing balance support: Bilateral upper extremity supported, During functional activity Standing balance-Leahy Scale: Fair                               Pertinent Vitals/Pain Pain Assessment Pain Assessment: No/denies pain    Home Living Family/patient expects to be discharged to:: Private residence Living Arrangements: Children Available Help at Discharge: Family;Available PRN/intermittently Type of Home: House Home Access: Stairs to enter Entrance Stairs-Rails: Can reach both;Right;Left Entrance Stairs-Number of Steps: 3+1   Home Layout: One level Home Equipment: Pharmacist, hospital (2 wheels);Rollator (4 wheels) Additional Comments: Pt lives with her daughter who has CP. 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    Prior Function Prior Level of Function : Independent/Modified Independent             Mobility Comments: Pt is the caregiver  for her daughter with CP (daughter has an aide that gets her in/out of bed, but pt provides meals & assists her daughter with self feeding). If aid does not come pt helps daughter with bed mobility Pt reports she's ambulatory with hurrycane or RW, denies falls, reports she drove +1 month ago (prior to last hospitalization) ADLs  Comments: Independent     Extremity/Trunk Assessment   Upper Extremity Assessment Upper Extremity Assessment: Defer to OT evaluation    Lower Extremity Assessment Lower Extremity Assessment: Generalized weakness    Cervical / Trunk Assessment Cervical / Trunk Assessment: Kyphotic  Communication   Communication Communication: No apparent difficulties    Cognition Arousal: Alert Behavior During Therapy: WFL for tasks assessed/performed   PT - Cognitive impairments: No apparent impairments                         Following commands: Intact       Cueing Cueing Techniques: Verbal cues     General Comments      Exercises     Assessment/Plan    PT Assessment Patient needs continued PT services  PT Problem List Decreased strength;Decreased activity tolerance;Decreased balance;Decreased mobility;Decreased safety awareness;Decreased knowledge of precautions;Decreased knowledge of use of DME;Cardiopulmonary status limiting activity       PT Treatment Interventions DME instruction;Stair training;Gait training;Functional mobility training;Therapeutic activities;Balance training;Therapeutic exercise;Neuromuscular re-education;Patient/family education    PT Goals (Current goals can be found in the Care Plan section)  Acute Rehab PT Goals Patient Stated Goal: to go home PT Goal Formulation: With patient Time For Goal Achievement: 01/23/24 Potential to Achieve Goals: Good    Frequency Min 2X/week     Co-evaluation               AM-PAC PT "6 Clicks" Mobility  Outcome Measure Help needed turning from your back to your side while in a flat bed without using bedrails?: A Little Help needed moving from lying on your back to sitting on the side of a flat bed without using bedrails?: A Little Help needed moving to and from a bed to a chair (including a wheelchair)?: A Little Help needed standing up from a chair using your arms (e.g., wheelchair or bedside  chair)?: A Little Help needed to walk in hospital room?: A Little Help needed climbing 3-5 steps with a railing? : A Little 6 Click Score: 18    End of Session   Activity Tolerance: Patient tolerated treatment well Patient left: in chair;with call bell/phone within reach;with chair alarm set Nurse Communication: Mobility status PT Visit Diagnosis: Muscle weakness (generalized) (M62.81);Unsteadiness on feet (R26.81)    Time: 1610-9604 PT Time Calculation (min) (ACUTE ONLY): 12 min   Charges:   PT Evaluation $PT Eval Moderate Complexity: 1 Mod   PT General Charges $$ ACUTE PT VISIT: 1 Visit         Janine Melbourne, PT, DPT Physical Therapist - [facility]  [facility]   Wyvonne Carda A Laneisha Mino 01/09/2024, 12:38 PM

## 2024-01-29 ENCOUNTER — Emergency Department

## 2024-01-29 ENCOUNTER — Inpatient Hospital Stay
Admission: EM | Admit: 2024-01-29 | Discharge: 2024-02-13 | DRG: 308 | Disposition: A | Discharge status: Skilled Nursing Facility | Attending: Hospitalist | Admitting: Hospitalist

## 2024-01-29 ENCOUNTER — Other Ambulatory Visit: Payer: Self-pay

## 2024-01-29 DIAGNOSIS — E11319 Type 2 diabetes mellitus with unspecified diabetic retinopathy without macular edema: Secondary | ICD-10-CM | POA: Diagnosis present

## 2024-01-29 DIAGNOSIS — I110 Hypertensive heart disease with heart failure: Secondary | ICD-10-CM | POA: Diagnosis present

## 2024-01-29 DIAGNOSIS — J9601 Acute respiratory failure with hypoxia: Secondary | ICD-10-CM | POA: Diagnosis present

## 2024-01-29 DIAGNOSIS — Z751 Person awaiting admission to adequate facility elsewhere: Secondary | ICD-10-CM

## 2024-01-29 DIAGNOSIS — F32A Depression, unspecified: Secondary | ICD-10-CM | POA: Diagnosis present

## 2024-01-29 DIAGNOSIS — I4891 Unspecified atrial fibrillation: Secondary | ICD-10-CM | POA: Diagnosis not present

## 2024-01-29 DIAGNOSIS — E1151 Type 2 diabetes mellitus with diabetic peripheral angiopathy without gangrene: Secondary | ICD-10-CM | POA: Diagnosis present

## 2024-01-29 DIAGNOSIS — I6529 Occlusion and stenosis of unspecified carotid artery: Secondary | ICD-10-CM | POA: Diagnosis present

## 2024-01-29 DIAGNOSIS — N3000 Acute cystitis without hematuria: Secondary | ICD-10-CM | POA: Diagnosis present

## 2024-01-29 DIAGNOSIS — Z87440 Personal history of urinary (tract) infections: Secondary | ICD-10-CM

## 2024-01-29 DIAGNOSIS — I4819 Other persistent atrial fibrillation: Secondary | ICD-10-CM | POA: Diagnosis present

## 2024-01-29 DIAGNOSIS — I480 Paroxysmal atrial fibrillation: Secondary | ICD-10-CM

## 2024-01-29 DIAGNOSIS — E119 Type 2 diabetes mellitus without complications: Secondary | ICD-10-CM

## 2024-01-29 DIAGNOSIS — M069 Rheumatoid arthritis, unspecified: Secondary | ICD-10-CM | POA: Diagnosis present

## 2024-01-29 DIAGNOSIS — E039 Hypothyroidism, unspecified: Secondary | ICD-10-CM | POA: Diagnosis present

## 2024-01-29 DIAGNOSIS — Z7901 Long term (current) use of anticoagulants: Secondary | ICD-10-CM

## 2024-01-29 DIAGNOSIS — Z90710 Acquired absence of both cervix and uterus: Secondary | ICD-10-CM

## 2024-01-29 DIAGNOSIS — Z8249 Family history of ischemic heart disease and other diseases of the circulatory system: Secondary | ICD-10-CM

## 2024-01-29 DIAGNOSIS — Z881 Allergy status to other antibiotic agents status: Secondary | ICD-10-CM

## 2024-01-29 DIAGNOSIS — Z8701 Personal history of pneumonia (recurrent): Secondary | ICD-10-CM

## 2024-01-29 DIAGNOSIS — I959 Hypotension, unspecified: Secondary | ICD-10-CM | POA: Diagnosis not present

## 2024-01-29 DIAGNOSIS — Z7984 Long term (current) use of oral hypoglycemic drugs: Secondary | ICD-10-CM

## 2024-01-29 DIAGNOSIS — E7800 Pure hypercholesterolemia, unspecified: Secondary | ICD-10-CM | POA: Diagnosis present

## 2024-01-29 DIAGNOSIS — F411 Generalized anxiety disorder: Secondary | ICD-10-CM | POA: Diagnosis present

## 2024-01-29 DIAGNOSIS — I700 Atherosclerosis of aorta: Secondary | ICD-10-CM | POA: Diagnosis present

## 2024-01-29 DIAGNOSIS — J189 Pneumonia, unspecified organism: Secondary | ICD-10-CM

## 2024-01-29 DIAGNOSIS — I2510 Atherosclerotic heart disease of native coronary artery without angina pectoris: Secondary | ICD-10-CM | POA: Diagnosis present

## 2024-01-29 DIAGNOSIS — E440 Moderate protein-calorie malnutrition: Secondary | ICD-10-CM | POA: Diagnosis present

## 2024-01-29 DIAGNOSIS — Z79899 Other long term (current) drug therapy: Secondary | ICD-10-CM | POA: Diagnosis not present

## 2024-01-29 DIAGNOSIS — Z96643 Presence of artificial hip joint, bilateral: Secondary | ICD-10-CM | POA: Diagnosis present

## 2024-01-29 DIAGNOSIS — Z66 Do not resuscitate: Secondary | ICD-10-CM | POA: Diagnosis present

## 2024-01-29 DIAGNOSIS — Z882 Allergy status to sulfonamides status: Secondary | ICD-10-CM

## 2024-01-29 DIAGNOSIS — E538 Deficiency of other specified B group vitamins: Secondary | ICD-10-CM | POA: Diagnosis present

## 2024-01-29 DIAGNOSIS — I509 Heart failure, unspecified: Secondary | ICD-10-CM | POA: Diagnosis present

## 2024-01-29 DIAGNOSIS — R5381 Other malaise: Secondary | ICD-10-CM | POA: Diagnosis present

## 2024-01-29 DIAGNOSIS — Z96653 Presence of artificial knee joint, bilateral: Secondary | ICD-10-CM | POA: Diagnosis present

## 2024-01-29 DIAGNOSIS — I150 Renovascular hypertension: Secondary | ICD-10-CM | POA: Diagnosis present

## 2024-01-29 DIAGNOSIS — E876 Hypokalemia: Secondary | ICD-10-CM | POA: Diagnosis not present

## 2024-01-29 DIAGNOSIS — J159 Unspecified bacterial pneumonia: Secondary | ICD-10-CM | POA: Diagnosis present

## 2024-01-29 DIAGNOSIS — I701 Atherosclerosis of renal artery: Secondary | ICD-10-CM | POA: Diagnosis present

## 2024-01-29 DIAGNOSIS — Z7902 Long term (current) use of antithrombotics/antiplatelets: Secondary | ICD-10-CM | POA: Diagnosis not present

## 2024-01-29 DIAGNOSIS — Z79890 Hormone replacement therapy (postmenopausal): Secondary | ICD-10-CM | POA: Diagnosis not present

## 2024-01-29 DIAGNOSIS — G4730 Sleep apnea, unspecified: Secondary | ICD-10-CM | POA: Diagnosis present

## 2024-01-29 DIAGNOSIS — I5033 Acute on chronic diastolic (congestive) heart failure: Secondary | ICD-10-CM | POA: Diagnosis present

## 2024-01-29 DIAGNOSIS — Z6822 Body mass index (BMI) 22.0-22.9, adult: Secondary | ICD-10-CM

## 2024-01-29 DIAGNOSIS — B3731 Acute candidiasis of vulva and vagina: Secondary | ICD-10-CM | POA: Diagnosis not present

## 2024-01-29 DIAGNOSIS — K219 Gastro-esophageal reflux disease without esophagitis: Secondary | ICD-10-CM | POA: Diagnosis present

## 2024-01-29 DIAGNOSIS — J45909 Unspecified asthma, uncomplicated: Secondary | ICD-10-CM | POA: Diagnosis present

## 2024-01-29 DIAGNOSIS — N3281 Overactive bladder: Secondary | ICD-10-CM | POA: Diagnosis present

## 2024-01-29 DIAGNOSIS — Z8673 Personal history of transient ischemic attack (TIA), and cerebral infarction without residual deficits: Secondary | ICD-10-CM

## 2024-01-29 LAB — URINALYSIS, ROUTINE W REFLEX MICROSCOPIC
Bilirubin Urine: NEGATIVE
Glucose, UA: NEGATIVE mg/dL
Hgb urine dipstick: NEGATIVE
Ketones, ur: NEGATIVE mg/dL
Nitrite: NEGATIVE
Protein, ur: >=300 mg/dL — AB
RBC / HPF: >50 RBC/hpf (ref 0–5)
Specific Gravity, Urine: 1.024 (ref 1.005–1.030)
WBC, UA: >50 WBC/hpf (ref 0–5)
pH: 5 (ref 5.0–8.0)

## 2024-01-29 LAB — TROPONIN I (HIGH SENSITIVITY)
Troponin I (High Sensitivity): 19 ng/L — ABNORMAL HIGH (ref <18)
Troponin I (High Sensitivity): 20 ng/L — ABNORMAL HIGH (ref <18)

## 2024-01-29 LAB — CBC WITH DIFFERENTIAL/PLATELET
Abs Immature Granulocytes: 0.02 10*3/uL (ref 0.00–0.07)
Basophils Absolute: 0.1 10*3/uL (ref 0.0–0.1)
Basophils Relative: 1 %
Eosinophils Absolute: 0.3 10*3/uL (ref 0.0–0.5)
Eosinophils Relative: 4 %
HCT: 37 % (ref 36.0–46.0)
Hemoglobin: 11.7 g/dL — ABNORMAL LOW (ref 12.0–15.0)
Immature Granulocytes: 0 %
Lymphocytes Relative: 16 %
Lymphs Abs: 1.1 10*3/uL (ref 0.7–4.0)
MCH: 31.1 pg (ref 26.0–34.0)
MCHC: 31.6 g/dL (ref 30.0–36.0)
MCV: 98.4 fL (ref 80.0–100.0)
Monocytes Absolute: 0.6 10*3/uL (ref 0.1–1.0)
Monocytes Relative: 8 %
Neutro Abs: 5.2 10*3/uL (ref 1.7–7.7)
Neutrophils Relative %: 71 %
Platelets: 193 10*3/uL (ref 150–400)
RBC: 3.76 MIL/uL — ABNORMAL LOW (ref 3.87–5.11)
RDW: 14.8 % (ref 11.5–15.5)
WBC: 7.3 10*3/uL (ref 4.0–10.5)
nRBC: 0 % (ref 0.0–0.2)

## 2024-01-29 LAB — COMPREHENSIVE METABOLIC PANEL WITH GFR
ALT: 8 U/L (ref 0–44)
AST: 33 U/L (ref 15–41)
Albumin: 2.6 g/dL — ABNORMAL LOW (ref 3.5–5.0)
Alkaline Phosphatase: 49 U/L (ref 38–126)
Anion gap: 8 (ref 5–15)
BUN: 14 mg/dL (ref 8–23)
CO2: 26 mmol/L (ref 22–32)
Calcium: 9.2 mg/dL (ref 8.9–10.3)
Chloride: 103 mmol/L (ref 98–111)
Creatinine, Ser: 0.55 mg/dL (ref 0.44–1.00)
GFR, Estimated: >60 mL/min (ref >60)
Glucose, Bld: 191 mg/dL — ABNORMAL HIGH (ref 70–99)
Potassium: 4.3 mmol/L (ref 3.5–5.1)
Sodium: 137 mmol/L (ref 135–145)
Total Bilirubin: 1.4 mg/dL — ABNORMAL HIGH (ref 0.0–1.2)
Total Protein: 6.2 g/dL — ABNORMAL LOW (ref 6.5–8.1)

## 2024-01-29 LAB — BRAIN NATRIURETIC PEPTIDE: B Natriuretic Peptide: 823.2 pg/mL — ABNORMAL HIGH (ref 0.0–100.0)

## 2024-01-29 LAB — RESP PANEL BY RT-PCR (RSV, FLU A&B, COVID)  RVPGX2
Influenza A by PCR: NEGATIVE
Influenza B by PCR: NEGATIVE
Resp Syncytial Virus by PCR: NEGATIVE
SARS Coronavirus 2 by RT PCR: NEGATIVE

## 2024-01-29 LAB — LACTIC ACID, PLASMA
Lactic Acid, Venous: 1.1 mmol/L (ref 0.5–1.9)
Lactic Acid, Venous: 1.4 mmol/L (ref 0.5–1.9)

## 2024-01-29 LAB — DIGOXIN LEVEL: Digoxin Level: <0.2 ng/mL — ABNORMAL LOW (ref 0.8–2.0)

## 2024-01-29 LAB — MAGNESIUM: Magnesium: 1.5 mg/dL — ABNORMAL LOW (ref 1.7–2.4)

## 2024-01-29 MED ORDER — ACETAMINOPHEN 325 MG PO TABS
650.0000 mg | ORAL_TABLET | ORAL | Status: DC | PRN
Start: 2024-01-29 — End: 2024-02-13
  Administered 2024-01-31 – 2024-02-11 (×10): 650 mg via ORAL
  Filled 2024-01-29 (×12): qty 2

## 2024-01-29 MED ORDER — PANTOPRAZOLE SODIUM 20 MG PO TBEC
20.0000 mg | DELAYED_RELEASE_TABLET | Freq: Every day | ORAL | Status: DC
  Administered 2024-01-30 – 2024-02-13 (×15): 20 mg via ORAL
  Filled 2024-01-29 (×15): qty 1

## 2024-01-29 MED ORDER — CARVEDILOL 25 MG PO TABS
50.0000 mg | ORAL_TABLET | Freq: Two times a day (BID) | ORAL | Status: DC
  Administered 2024-01-30: 50 mg via ORAL
  Filled 2024-01-29: qty 2

## 2024-01-29 MED ORDER — ROSUVASTATIN CALCIUM 10 MG PO TABS
5.0000 mg | ORAL_TABLET | Freq: Every day | ORAL | Status: DC
  Administered 2024-01-30 – 2024-02-13 (×15): 5 mg via ORAL
  Filled 2024-01-29 (×16): qty 1

## 2024-01-29 MED ORDER — IRBESARTAN 150 MG PO TABS
150.0000 mg | ORAL_TABLET | Freq: Every day | ORAL | Status: DC
  Administered 2024-01-30: 150 mg via ORAL
  Filled 2024-01-29: qty 1

## 2024-01-29 MED ORDER — PAROXETINE HCL 20 MG PO TABS
40.0000 mg | ORAL_TABLET | Freq: Every evening | ORAL | Status: DC
  Administered 2024-01-31 – 2024-02-12 (×13): 40 mg via ORAL
  Filled 2024-01-29 (×16): qty 2

## 2024-01-29 MED ORDER — ONDANSETRON HCL 4 MG/2ML IJ SOLN: 4.0000 mg | Freq: Four times a day (QID) | INTRAMUSCULAR | Status: DC | PRN

## 2024-01-29 MED ORDER — MIRABEGRON ER 25 MG PO TB24
25.0000 mg | ORAL_TABLET | Freq: Every day | ORAL | Status: DC
  Administered 2024-01-30 – 2024-02-13 (×15): 25 mg via ORAL
  Filled 2024-01-29 (×15): qty 1

## 2024-01-29 MED ORDER — LEVOFLOXACIN IN D5W 750 MG/150ML IV SOLN
750.0000 mg | Freq: Once | INTRAVENOUS | Status: AC
  Administered 2024-01-29: 750 mg via INTRAVENOUS
  Filled 2024-01-29: qty 150

## 2024-01-29 MED ORDER — SODIUM CHLORIDE 0.9% FLUSH: 3.0000 mL | INTRAVENOUS | Status: DC | PRN

## 2024-01-29 MED ORDER — LEVOTHYROXINE SODIUM 50 MCG PO TABS
75.0000 ug | ORAL_TABLET | Freq: Every day | ORAL | Status: DC
  Administered 2024-01-30 – 2024-02-13 (×15): 75 ug via ORAL
  Filled 2024-01-29: qty 2
  Filled 2024-01-29 (×14): qty 1

## 2024-01-29 MED ORDER — FUROSEMIDE 10 MG/ML IJ SOLN
40.0000 mg | Freq: Every day | INTRAMUSCULAR | Status: DC
  Administered 2024-01-30: 40 mg via INTRAVENOUS
  Filled 2024-01-29: qty 4

## 2024-01-29 MED ORDER — MAGNESIUM SULFATE 2 GM/50ML IV SOLN
2.0000 g | Freq: Once | INTRAVENOUS | Status: AC
  Administered 2024-01-29: 2 g via INTRAVENOUS
  Filled 2024-01-29: qty 50

## 2024-01-29 MED ORDER — BUSPIRONE HCL 10 MG PO TABS
10.0000 mg | ORAL_TABLET | Freq: Two times a day (BID) | ORAL | Status: DC
  Administered 2024-01-30 – 2024-02-13 (×29): 10 mg via ORAL
  Filled 2024-01-29 (×22): qty 1
  Filled 2024-01-29: qty 2
  Filled 2024-01-29 (×4): qty 1
  Filled 2024-01-29: qty 2
  Filled 2024-01-29 (×2): qty 1

## 2024-01-29 MED ORDER — SODIUM CHLORIDE 0.9 % IV SOLN: 250.0000 mL | INTRAVENOUS | Status: AC | PRN

## 2024-01-29 MED ORDER — DIGOXIN 125 MCG PO TABS
0.1250 mg | ORAL_TABLET | Freq: Every day | ORAL | Status: DC
  Administered 2024-01-30 – 2024-02-07 (×9): 0.125 mg via ORAL
  Filled 2024-01-29 (×9): qty 1

## 2024-01-29 MED ORDER — SODIUM CHLORIDE 0.9% FLUSH
3.0000 mL | Freq: Two times a day (BID) | INTRAVENOUS | Status: DC
  Administered 2024-01-30 – 2024-02-13 (×29): 3 mL via INTRAVENOUS

## 2024-01-29 MED ORDER — APIXABAN 5 MG PO TABS
5.0000 mg | ORAL_TABLET | Freq: Two times a day (BID) | ORAL | Status: DC
  Administered 2024-01-30 – 2024-02-13 (×29): 5 mg via ORAL
  Filled 2024-01-29 (×30): qty 1

## 2024-01-29 MED ORDER — LEVOFLOXACIN IN D5W 250 MG/50ML IV SOLN
250.0000 mg | INTRAVENOUS | Status: DC
  Filled 2024-01-29: qty 50

## 2024-01-29 MED ORDER — TRAZODONE HCL 50 MG PO TABS
25.0000 mg | ORAL_TABLET | Freq: Every evening | ORAL | Status: DC | PRN
  Administered 2024-01-30 – 2024-02-10 (×7): 25 mg via ORAL
  Filled 2024-01-29 (×8): qty 1

## 2024-01-29 MED ORDER — FUROSEMIDE 10 MG/ML IJ SOLN
40.0000 mg | Freq: Once | INTRAMUSCULAR | Status: AC
  Administered 2024-01-29: 40 mg via INTRAVENOUS
  Filled 2024-01-29: qty 4

## 2024-01-29 MED ORDER — DILTIAZEM HCL-DEXTROSE 125-5 MG/125ML-% IV SOLN (PREMIX)
5.0000 mg/h | INTRAVENOUS | Status: DC
  Administered 2024-01-29: 5 mg/h via INTRAVENOUS
  Filled 2024-01-29 (×3): qty 125

## 2024-01-29 NOTE — ED Notes (Signed)
 The pt was assisted on a bed pan without incident.

## 2024-01-29 NOTE — H&P (Addendum)
 History and Physical    Patient: Mary [name] VWU:[id number] DOB: [birth year] DOA: 01/29/2024 DOS: the patient was seen and examined on 01/29/2024 PCP: Monique Ano, MD  Patient coming from: Home  Chief Complaint:  Chief Complaint  Patient presents with   Shortness of Breath   HPI: Mary [name] is a 89 y.o. female with medical history significant of coronary disease, hypertension, paroxysmal atrial fibrillation on anticoagulation with Eliquis , HFpEF, asthma/COPD as a result of secondhand smoking, hypothyroidism She presents to the emergency room with multiple complaints that include palpitations, associated shortness of breath and lower abdominal pain and spasms.  She felt her lower abdominal symptoms were likely attributed to urinary tract infection.  She denies any fever or chills.  In the ER, patient was noted to be in A-fib with RVR.  Heart rates in the 160s.  Patient required oxygen supplementation in the ED.  Chest x-ray did reveal some vascular congestion.  Abnormal urinalysis suggesting possible UTI.  She was initiated on IV Cardizem  in the ED.  Rate is better controlled.   Review of Systems: As mentioned in the history of present illness. All other systems reviewed and are negative. Past Medical History:  Diagnosis Date   Acid reflux    Anemia    Arthritis    Asthma    Atherosclerosis of abdominal aorta (HCC)    B12 deficiency    Clostridium difficile infection    H/O   Coronary artery disease    DDD (degenerative disc disease), lumbar    Depression    Diabetes (HCC)    type 2   Diabetic retinopathy (HCC)    Dysrhythmia    Heart murmur    HLD (hyperlipidemia)    HTN (hypertension)    Hypotension    when get up too quickly   Hypothyroidism    Pneumonia    PONV (postoperative nausea and vomiting)    Pulmonary nodule    right middle lobe on CT scan   Pure hypercholesterolemia    RA (rheumatoid arthritis) (HCC)    Sleep apnea    Spinal stenosis    Urinary  urgency    Past Surgical History:  Procedure Laterality Date   ABDOMINAL HYSTERECTOMY     CATARACT EXTRACTION, BILATERAL     COLONOSCOPY N/A 07/29/2021   Procedure: COLONOSCOPY;  Surgeon: Toledo, Alphonsus Jeans, MD;  Location: [facility] ENDOSCOPY;  Service: Gastroenterology;  Laterality: N/A;   CYSTOSCOPY     ENDARTERECTOMY Right 12/02/2021   Procedure: ENDARTERECTOMY CAROTID;  Surgeon: Celso College, MD;  Location: [facility] ORS;  Service: Vascular;  Laterality: Right;   ESOPHAGOGASTRODUODENOSCOPY N/A 07/29/2021   Procedure: ESOPHAGOGASTRODUODENOSCOPY (EGD);  Surgeon: Toledo, Alphonsus Jeans, MD;  Location: [facility] ENDOSCOPY;  Service: Gastroenterology;  Laterality: N/A;  DM   HIP ARTHROPLASTY Right 09/24/2015   Procedure: ARTHROPLASTY BIPOLAR HIP (HEMIARTHROPLASTY);  Surgeon: Elner Hahn, MD;  Location: [facility] ORS;  Service: Orthopedics;  Laterality: Right;   HIP ARTHROPLASTY Left 09/29/2018   Procedure: ARTHROPLASTY BIPOLAR HIP (HEMIARTHROPLASTY) LEFT;  Surgeon: Elner Hahn, MD;  Location: [facility] ORS;  Service: Orthopedics;  Laterality: Left;   KNEE ARTHROSCOPY W/ AUTOGENOUS CARTILAGE IMPLANTATION (ACI) PROCEDURE     TOTAL KNEE ARTHROPLASTY Left 12/01/2017   Procedure: TOTAL KNEE ARTHROPLASTY;  Surgeon: Elner Hahn, MD;  Location: [facility] ORS;  Service: Orthopedics;  Laterality: Left;   TOTAL KNEE ARTHROPLASTY Right 2014   VARICOSE VEIN SURGERY Left    leg   Social History:  reports that she has never  smoked. She has never used smokeless tobacco. She reports that she does not drink alcohol  and does not use drugs.  Allergies  Allergen Reactions   Cephalexin Hives   Nitrofurantoin     Other reaction(s): Other (See Comments) Other Reaction: measles-like lesions   Sulfa Antibiotics Hives   Atorvastatin Other (See Comments)    Muscle spasms/ muscle pain    Family History  Problem Relation Age of Onset   Kidney disease Brother        also nephew   Heart disease Mother    Heart disease Father    Prostate  cancer Neg Hx    Bladder Cancer Neg Hx    Breast cancer Neg Hx    Kidney cancer Neg Hx     Prior to Admission medications   Medication Sig Start Date End Date Taking? Authorizing Provider  acetaminophen  (TYLENOL ) 500 MG tablet Take 500 mg by mouth every 6 (six) hours as needed for mild pain (pain score 1-3).    [provider]  apixaban  (ELIQUIS ) 5 MG TABS tablet Take 1 tablet (5 mg total) by mouth 2 (two) times daily. 12/16/23   Anderson, Chelsey L, MD  azelastine (OPTIVAR) 0.05 % ophthalmic solution Place 1 drop into both eyes daily as needed (allergies).    [provider]  busPIRone  (BUSPAR ) 10 MG tablet Take 10 mg by mouth 2 (two) times daily.    [provider]  carvedilol  (COREG ) 25 MG tablet Take 2 tablets (50 mg total) by mouth 2 (two) times daily with a meal. 12/16/23   Ree Candy, MD  cyclobenzaprine  (FLEXERIL ) 5 MG tablet Take 1 tablet (5 mg total) by mouth 3 (three) times daily as needed for muscle spasms. 12/16/23   Ree Candy, MD  digoxin  (LANOXIN ) 0.125 MG tablet Take 1 tablet (0.125 mg total) by mouth daily. 12/17/23   Ree Candy, MD  furosemide  (LASIX ) 40 MG tablet Take 1 tablet (40 mg total) by mouth daily. 01/09/24   Tiajuana Fluke, MD  irbesartan  (AVAPRO ) 150 MG tablet Take 1 tablet (150 mg total) by mouth daily. 12/17/23   Ree Candy, MD  Iron -Vitamin C  (VITRON-C) 65-125 MG TABS Take 1 tablet by mouth daily. 02/24/21   Timmy Forbes, MD  levothyroxine  (SYNTHROID , LEVOTHROID) 75 MCG tablet Take 75 mcg by mouth daily.     [provider]  metFORMIN  (GLUCOPHAGE -XR) 500 MG 24 hr tablet Take 500 mg by mouth daily. With dinner 09/28/23   [provider]  pantoprazole  (PROTONIX ) 20 MG tablet Take 20 mg by mouth daily.    [provider]  PARoxetine  (PAXIL ) 40 MG tablet Take 40 mg by mouth every evening.    [provider]  potassium chloride  SA (KLOR-CON  M) 20 MEQ tablet Take 20 mEq by mouth  2 (two) times daily.  Take 1 tablet (20 mEq total) by mouth 2 (two) times daily With lasix  01/06/24 01/05/25  [provider]  rosuvastatin  (CRESTOR ) 5 MG tablet Take 1 tablet (5 mg total) by mouth daily. 04/22/22   Brown, Fallon E, NP  traZODone  (DESYREL ) 50 MG tablet Take 0.5 tablets (25 mg total) by mouth at bedtime as needed for sleep. 12/16/23   Ree Candy, MD  Vibegron  (GEMTESA ) 75 MG TABS Take 1 tablet (75 mg total) by mouth daily at 6 (six) AM. 11/16/23   Stoioff, Kizzie Perks, MD  vitamin B-12 (CYANOCOBALAMIN ) 500 MCG tablet Take 500 mcg by mouth every other day.  [provider]    Physical Exam: Vitals:   01/29/24 1703 01/29/24 1708 01/29/24 1830  BP:  99/79 117/77  Pulse: (!) 123  (!) 32  Resp: (!) 25  (!) 27  Temp: 98.3 F (36.8 C)    TempSrc: Oral    SpO2: 96%  96%  Weight: 63.5 kg    Height: 5\' 5"  (1.651 m)     General: Frail Caucasian female who is laying comfortably in bed.  Not in any acute distress. HEENT: Pupils equal round reactive to light.  Not pale not jaundiced. Neck: Supple.  No obvious JVD. Chest: Clinically diminished bilaterally Cardiovascular: Irregular rhythm.  Lower extremity without significant edema Abdomen: Flat soft nontender. CNS: No obvious focal deficit. Skin negative for any new rash. Data Reviewed:  Sodium is 137, potassium 4.3, chloride 103, bicarb 26, glucose 181, BUN is 14, creatinine 0.55, calcium  9.2, magnesium  1.5, AST 33, ALT 8 2 WBC 1.4.  BNP is elevated 823.  Troponin remains flat at 20 and 19 respectively.  WBC 7.3, hemoglobin 11.7, hematocrit 37, platelet 183 neutrophil count 71.  Digoxin  less than 0.2.  Glucose 191  Chest x-ray shows patchy bilateral airspace opacities:  Assessment and Plan:  Atrial fibrillation with RVR: Patient being optimized with Cardizem .  Will transition to p.o. carvedilol  and digoxin  per her home regimen when rate is controlled.  Cardiology will be consulted to help optimize patient's  symptoms.  Patient is on Eliquis  at baseline.   Digoxin  levels are subtherapeutic.  Unclear if patient has been compliant with medication.  Elevated BNP consistent with possible CHF: HFpEF She looks euvolemic Echo from April 17 shows preserved left ventricular ejection fraction of 65%. Chest x-ray without any frank pulmonary vascular congestion.  No significant pleural effusion was noted. Patient will be optimized with IV Lasix . Patient is on goal-directed medical therapy with irbesartan , Coreg  and furosemide  Daily weight monitoring per protocol.  Patchy bilateral airspace opacities: Suspected possible pneumonia.  Patient denies any coughing spells, or fever.  Patient is already on antibiotics with levofloxacin  for urinary tract infection.  Will continue with same  Abnormal urinalysis: Consistent with urinary tract infection.  Patient with symptomatic lower abdominal pain and spasms.  He has documented allergies to cephalexin nitrofurantoin and sulfa antibiotics.  Patient will be empirically covered with IV Levofloxacin .  History of asthma/COPD-not in acute exacerbation at this time. - Will optimize with as needed bronchodilators.  Hypothyroidism: Continue Synthroid .  Last TSH was within normal limits.    Advance Care Planning:   Code Status: Limited: Do not attempt resuscitation (DNR) -DNR-LIMITED -Do Not Intubate/DNI    Consults: Cardiology  Family Communication:   Severity of Illness: The appropriate patient status for this patient is INPATIENT. Inpatient status is judged to be reasonable and necessary in order to provide the required intensity of service to ensure the patient's safety. The patient's presenting symptoms, physical exam findings, and initial radiographic and laboratory data in the context of their chronic comorbidities is felt to place them at high risk for further clinical deterioration. Furthermore, it is not anticipated that the patient will be medically stable for  discharge from the hospital within 2 midnights of admission.   * I certify that at the point of admission it is my clinical judgment that the patient will require inpatient hospital care spanning beyond 2 midnights from the point of admission due to high intensity of service, high risk for further deterioration and high frequency of surveillance required.*  Author: Velena Keegan K  Nekesha Font, MD 01/29/2024 8:25 PM  For on call review [email or web address].

## 2024-01-29 NOTE — ED Notes (Signed)
 Dr. Synetta Eves was advised of the pt's current vitals. Dr. Synetta Eves advised to hold the pt's cardizem  at 7.5 mg per hour and monitor.

## 2024-01-29 NOTE — ED Triage Notes (Signed)
 Pt BIB [facility] from Home for SOB. EMS found in bed with labored breathing and respirations in the 30's, pt also showing AFIB RVR with a rate in the 160's. Was given 10mg  Cardizem  which brought HR down into 120's. Pt stating she thinks she has a UTI. Family stating pt should be wearing O2 at home however pt says she does not need it. EMS reports O2 at 89% on RA, placed on 4L [place] with O2 improving to 94%.

## 2024-01-29 NOTE — ED Provider Notes (Signed)
 [facility] Provider Note    Event Date/Time   First MD Initiated Contact with Patient 01/29/24 1655     (approximate)   History   Shortness of Breath   HPI  Mary [name] is a 89 year old female with history of HTN, CAD, CHF, A-fib on Eliquis  presenting to the emergency department for evaluation of shortness of breath.  The patient reports she has had intermittent shortness of breath for a while.  On EMS arrival, patient was found with labored respirations with respiratory rate in the 30s.  Also was noted to be in A-fib with heart rates in the 160s.  She was given 10 mg Cardizem  bolus with improvement in her heart rates to the 120s to 130s.  Patient tells me she does not wear oxygen, but family on scene reported that she is supposed to have home oxygen.  With EMS she was found to have a room air sat of 89% and was placed on 4 L with improvement to the mid 90s.  Patient additionally reports onset of dyspnea yesterday.  I reviewed her discharge summary from 01/09/2024.  At that time, patient was found to have A-fib with RVR thought to possibly be related to abnormal electrolytes.  I do not see note of oxygen use in her discharge summary.      Physical Exam   Triage Vital Signs: ED Triage Vitals [01/29/24 1703]  Encounter Vitals Group     BP      Systolic BP Percentile      Diastolic BP Percentile      Pulse Rate (!) 123     Resp (!) 25     Temp 98.3 F (36.8 C)     Temp Source Oral     SpO2 96 %     Weight 140 lb (63.5 kg)     Height 5\' 5"  (1.651 m)     Head Circumference      Peak Flow      Pain Score 0     Pain Loc      Pain Education      Exclude from Growth Chart     Most recent vital signs: Vitals:   01/29/24 1708 01/29/24 1830  BP: 99/79 117/77  Pulse:  (!) 32  Resp:  (!) 27  Temp:    SpO2:  96%     General: Awake, interactive  CV:  Tachycardic with heart rates ranging from the 110s to 130s, irregularly  irregular Resp:  Tachycardia with mildly labored respirations, satting in the mid 90s on 4 L. Abd:  Nondistended, soft, nontender Neuro:  Symmetric facial movement, fluid speech   ED Results / Procedures / Treatments   Labs (all labs ordered are listed, but only abnormal results are displayed) Labs Reviewed  CBC WITH DIFFERENTIAL/PLATELET - Abnormal; Notable for the following components:      Result Value   RBC 3.76 (*)    Hemoglobin 11.7 (*)    All other components within normal limits  COMPREHENSIVE METABOLIC PANEL WITH GFR - Abnormal; Notable for the following components:   Glucose, Bld 191 (*)    Total Protein 6.2 (*)    Albumin 2.6 (*)    Total Bilirubin 1.4 (*)    All other components within normal limits  BRAIN NATRIURETIC PEPTIDE - Abnormal; Notable for the following components:   B Natriuretic Peptide 823.2 (*)    All other components within normal limits  MAGNESIUM  - Abnormal; Notable for the following  components:   Magnesium  1.5 (*)    All other components within normal limits  DIGOXIN  LEVEL - Abnormal; Notable for the following components:   Digoxin  Level <0.2 (*)    All other components within normal limits  URINALYSIS, ROUTINE W REFLEX MICROSCOPIC - Abnormal; Notable for the following components:   Color, Urine AMBER (*)    APPearance CLOUDY (*)    Protein, ur >=300 (*)    Leukocytes,Ua LARGE (*)    Bacteria, UA FEW (*)    All other components within normal limits  TROPONIN I (HIGH SENSITIVITY) - Abnormal; Notable for the following components:   Troponin I (High Sensitivity) 19 (*)    All other components within normal limits  RESP PANEL BY RT-PCR (RSV, FLU A&B, COVID)  [id number]  CULTURE, BLOOD (ROUTINE X 2)  CULTURE, BLOOD (ROUTINE X 2)  LACTIC ACID, PLASMA  LACTIC ACID, PLASMA  TROPONIN I (HIGH SENSITIVITY)     EKG EKG independently reviewed and interpreted by myself demonstrates:  EKG demonstrates A-fib at a rate of 135, QRS 73, QTc 563, no acute ST  changes, prolonged QTc noted  RADIOLOGY Imaging independently reviewed and interpreted by myself demonstrates:  CXR with bilateral opacities, radiology notes concern for infection, with history some consideration for edema as well  Formal Radiology Read:  DG Chest Port 1 View Result Date: 01/29/2024 CLINICAL DATA:  Shortness of breath EXAM: PORTABLE CHEST 1 VIEW COMPARISON:  01/08/2024 FINDINGS: Heart and mediastinal contours within normal limits. Patchy bilateral airspace opacities, right greater than left. No visible significant effusions. No acute bony abnormality. IMPRESSION: Patchy bilateral airspace opacities concerning for infection. Electronically Signed   By: Janeece Mechanic M.D.   On: 01/29/2024 17:31    PROCEDURES:  Critical Care performed: Yes, see critical care procedure note(s)  CRITICAL CARE Performed by: Claria Crofts   Total critical care time: 32 minutes  Critical care time was exclusive of separately billable procedures and treating other patients.  Critical care was necessary to treat or prevent imminent or life-threatening deterioration.  Critical care was time spent personally by me on the following activities: development of treatment plan with patient and/or surrogate as well as nursing, discussions with consultants, evaluation of patient's response to treatment, examination of patient, obtaining history from patient or surrogate, ordering and performing treatments and interventions, ordering and review of laboratory studies, ordering and review of radiographic studies, pulse oximetry and re-evaluation of patient's condition.   Procedures   MEDICATIONS ORDERED IN ED: Medications  diltiazem  (CARDIZEM ) 125 mg in dextrose  5% 125 mL (1 mg/mL) infusion (7.5 mg/hr Intravenous Rate/Dose Change 01/29/24 1818)  magnesium  sulfate IVPB 2 g 50 mL (2 g Intravenous New Bag/Given 01/29/24 1935)  levofloxacin  (LEVAQUIN ) IVPB 750 mg (has no administration in time range)  furosemide   (LASIX ) injection 40 mg (40 mg Intravenous Given 01/29/24 1929)     IMPRESSION / MDM / ASSESSMENT AND PLAN / ED COURSE  I reviewed the triage vital signs and the nursing notes.  Differential diagnosis includes, but is not limited to, A-fib related to electrolyte abnormality, infection, medication nonadherence, UTI, pneumonia, CHF exacerbation  Patient's presentation is most consistent with acute presentation with potential threat to life or bodily function.  89 year old female presenting to the emergency department for evaluation of shortness of breath found to be in A-fib with RVR.  Remains in A-fib with RVR on presentation, heart rates in the 120s to 130s.  Received Dilt bolus prior to presentation, will place on diltiazem  drip.  Labs with normal white blood cell count, mild anemia but no reported acute bleeding loss, CMP without significant derangement.  Minimally elevated troponin, likely related to demand.  Mag low at 1.5, IV repletion ordered.  Elevated BNP at 823.  Undetectable digoxin  level, some concerns for medication adherence as it does appear that patient was supposed to continue this per her last discharge summary.  Urinalysis is concerning for infection.  Chest x-Assata Juncaj also with some concern for infection though I suspect this may be related to edema.  With her tachycardia and tachypnea, does meet SIRS criteria.  Ordered for empiric Levaquin  given cephalosporin allergy.  Also ordered for Lasix .  She subjectively feels improved on reevaluation.  Satting in the mid 90s on 3 L.  Diltiazem  drip at 7.5 with heart rates in the low 100s.  Will reach out to hospitalist team for admission.  1935 Case discussed with hospitalist team.  They will evaluate for anticipated admission.     FINAL CLINICAL IMPRESSION(S) / ED DIAGNOSES   Final diagnoses:  Atrial fibrillation with RVR (HCC)  Acute cystitis without hematuria  Community acquired pneumonia, unspecified laterality  Acute hypoxic  respiratory failure (HCC)     Rx / DC Orders   ED Discharge Orders     None        Note:  This document was prepared using Dragon voice recognition software and may include unintentional dictation errors.   Claria Crofts, MD 01/29/24 [phone]

## 2024-01-29 NOTE — ED Notes (Signed)
 Spoke with [name] , Pt daughter and updated on pt status with pt consent

## 2024-01-29 NOTE — Sepsis Progress Note (Signed)
 Elink following code sepsis

## 2024-01-29 NOTE — Progress Notes (Signed)
 PHARMACY NOTE:  ANTIMICROBIAL RENAL DOSAGE ADJUSTMENT  Current antimicrobial regimen includes a mismatch between antimicrobial dosage and estimated renal function.  As per policy approved by the Pharmacy & Therapeutics and Medical Executive Committees, the antimicrobial dosage will be adjusted accordingly.  Current antimicrobial dosage:  Levaquin  500 mg IV Q24H   Indication: UTI   Renal Function:  Estimated Creatinine Clearance: 45.4 mL/min (by C-G formula based on SCr of 0.55 mg/dL). []      On intermittent HD, scheduled: []      On CRRT    Antimicrobial dosage has been changed to:  Levaquin  250 mg IV Q24H   Additional comments:   Thank you for allowing pharmacy to be a part of this patient's care.  Talynn Lebon D, [facility] 01/29/2024 10:32 PM

## 2024-01-30 ENCOUNTER — Encounter: Payer: Self-pay | Admitting: Internal Medicine

## 2024-01-30 DIAGNOSIS — I5033 Acute on chronic diastolic (congestive) heart failure: Secondary | ICD-10-CM

## 2024-01-30 LAB — BASIC METABOLIC PANEL WITH GFR
Anion gap: 7 (ref 5–15)
BUN: 12 mg/dL (ref 8–23)
CO2: 30 mmol/L (ref 22–32)
Calcium: 8.9 mg/dL (ref 8.9–10.3)
Chloride: 100 mmol/L (ref 98–111)
Creatinine, Ser: 0.6 mg/dL (ref 0.44–1.00)
GFR, Estimated: >60 mL/min (ref >60)
Glucose, Bld: 141 mg/dL — ABNORMAL HIGH (ref 70–99)
Potassium: 3.3 mmol/L — ABNORMAL LOW (ref 3.5–5.1)
Sodium: 137 mmol/L (ref 135–145)

## 2024-01-30 LAB — RESPIRATORY PANEL BY PCR

## 2024-01-30 LAB — STREP PNEUMONIAE URINARY ANTIGEN: Strep Pneumo Urinary Antigen: NEGATIVE

## 2024-01-30 LAB — MAGNESIUM: Magnesium: 1.7 mg/dL (ref 1.7–2.4)

## 2024-01-30 LAB — TSH: TSH: 4.51 u[IU]/mL — ABNORMAL HIGH (ref 0.350–4.500)

## 2024-01-30 LAB — GLUCOSE, CAPILLARY
Glucose-Capillary: 207 mg/dL — ABNORMAL HIGH (ref 70–99)
Glucose-Capillary: 125 mg/dL — ABNORMAL HIGH (ref 70–99)
Glucose-Capillary: 130 mg/dL — ABNORMAL HIGH (ref 70–99)

## 2024-01-30 MED ORDER — SODIUM CHLORIDE 0.9 % IV SOLN
100.0000 mg | Freq: Two times a day (BID) | INTRAVENOUS | Status: DC
  Administered 2024-01-30 – 2024-01-31 (×2): 100 mg via INTRAVENOUS
  Filled 2024-01-30 (×2): qty 100

## 2024-01-30 MED ORDER — INSULIN ASPART 100 UNIT/ML IJ SOLN
0.0000 [IU] | Freq: Three times a day (TID) | INTRAMUSCULAR | Status: DC
  Administered 2024-01-30: 3 [IU] via SUBCUTANEOUS
  Administered 2024-01-31: 2 [IU] via SUBCUTANEOUS
  Administered 2024-01-31 (×2): 1 [IU] via SUBCUTANEOUS
  Administered 2024-02-01: 2 [IU] via SUBCUTANEOUS
  Administered 2024-02-01 (×2): 1 [IU] via SUBCUTANEOUS
  Administered 2024-02-02: 3 [IU] via SUBCUTANEOUS
  Administered 2024-02-02: 1 [IU] via SUBCUTANEOUS
  Administered 2024-02-03 (×3): 2 [IU] via SUBCUTANEOUS
  Administered 2024-02-04: 3 [IU] via SUBCUTANEOUS
  Administered 2024-02-05: 1 [IU] via SUBCUTANEOUS
  Administered 2024-02-06: 2 [IU] via SUBCUTANEOUS
  Administered 2024-02-07: 3 [IU] via SUBCUTANEOUS
  Administered 2024-02-08: 2 [IU] via SUBCUTANEOUS
  Administered 2024-02-08: 1 [IU] via SUBCUTANEOUS
  Administered 2024-02-08 – 2024-02-09 (×2): 2 [IU] via SUBCUTANEOUS
  Administered 2024-02-09: 5 [IU] via SUBCUTANEOUS
  Administered 2024-02-09: 2 [IU] via SUBCUTANEOUS
  Administered 2024-02-10: 3 [IU] via SUBCUTANEOUS
  Administered 2024-02-10: 1 [IU] via SUBCUTANEOUS
  Administered 2024-02-11 (×2): 2 [IU] via SUBCUTANEOUS
  Administered 2024-02-12 (×2): 1 [IU] via SUBCUTANEOUS
  Administered 2024-02-12: 2 [IU] via SUBCUTANEOUS
  Administered 2024-02-13: 1 [IU] via SUBCUTANEOUS
  Filled 2024-01-30 (×30): qty 1

## 2024-01-30 MED ORDER — SODIUM CHLORIDE 0.9 % IV SOLN
1.0000 g | INTRAVENOUS | Status: AC
  Administered 2024-01-30 – 2024-02-02 (×4): 1 g via INTRAVENOUS
  Filled 2024-01-30 (×4): qty 10

## 2024-01-30 MED ORDER — INSULIN ASPART 100 UNIT/ML IJ SOLN: 0.0000 [IU] | Freq: Every day | INTRAMUSCULAR | Status: DC

## 2024-01-30 MED ORDER — CYCLOBENZAPRINE HCL 10 MG PO TABS
5.0000 mg | ORAL_TABLET | Freq: Three times a day (TID) | ORAL | Status: DC | PRN
  Administered 2024-01-30 – 2024-02-09 (×10): 5 mg via ORAL
  Filled 2024-01-30 (×10): qty 1

## 2024-01-30 MED ORDER — DILTIAZEM HCL 30 MG PO TABS
60.0000 mg | ORAL_TABLET | Freq: Four times a day (QID) | ORAL | Status: DC
  Administered 2024-01-30: 60 mg via ORAL
  Filled 2024-01-30 (×2): qty 2

## 2024-01-30 MED ORDER — POTASSIUM CHLORIDE CRYS ER 20 MEQ PO TBCR
60.0000 meq | EXTENDED_RELEASE_TABLET | Freq: Once | ORAL | Status: AC
  Administered 2024-01-30: 60 meq via ORAL
  Filled 2024-01-30: qty 3

## 2024-01-30 MED ORDER — FUROSEMIDE 10 MG/ML IJ SOLN
40.0000 mg | Freq: Two times a day (BID) | INTRAMUSCULAR | Status: DC
  Administered 2024-01-30 – 2024-02-07 (×17): 40 mg via INTRAVENOUS
  Filled 2024-01-30 (×16): qty 4

## 2024-01-30 MED ORDER — CLOPIDOGREL BISULFATE 75 MG PO TABS
75.0000 mg | ORAL_TABLET | Freq: Every day | ORAL | Status: DC
  Administered 2024-01-30 – 2024-02-13 (×15): 75 mg via ORAL
  Filled 2024-01-30 (×15): qty 1

## 2024-01-30 MED ORDER — CARVEDILOL 25 MG PO TABS
25.0000 mg | ORAL_TABLET | Freq: Two times a day (BID) | ORAL | Status: DC
  Administered 2024-01-30 – 2024-02-02 (×6): 25 mg via ORAL
  Filled 2024-01-30 (×6): qty 1

## 2024-01-30 MED ORDER — MAGNESIUM SULFATE 2 GM/50ML IV SOLN
2.0000 g | Freq: Once | INTRAVENOUS | Status: AC
  Administered 2024-01-30: 2 g via INTRAVENOUS
  Filled 2024-01-30: qty 50

## 2024-01-30 NOTE — Progress Notes (Signed)
 PROGRESS NOTE    HUSNA KRONE  MVH:[id number] DOB: 1935-05-20 DOA: 01/29/2024 PCP: Monique Ano, MD  Outpatient Specialists: cardiology, urology    Brief Narrative:   From admission h and p  Lanelle B Los is a 89 y.o. female with medical history significant of coronary disease, hypertension, paroxysmal atrial fibrillation on anticoagulation with Eliquis , HFpEF, asthma/COPD as a result of secondhand smoking, hypothyroidism She presents to the emergency room with multiple complaints that include palpitations, associated shortness of breath and lower abdominal pain and spasms.  She felt her lower abdominal symptoms were likely attributed to urinary tract infection.  She denies any fever or chills.   In the ER, patient was noted to be in A-fib with RVR.  Heart rates in the 160s.  Patient required oxygen supplementation in the ED.  Chest x-ray did reveal some vascular congestion.  Abnormal urinalysis suggesting possible UTI.   She was initiated on IV Cardizem  in the ED.  Rate is better controlled.   Assessment & Plan:   Principal Problem:   CHF (congestive heart failure) (HCC) Active Problems:   Hypothyroidism   Diabetes mellitus without complication (HCC)   Asthma   Coronary artery disease involving native coronary artery of native heart   Renal artery stenosis (HCC)   Hypertension, renovascular   A-fib (HCC)  # A-fib with RVR Recurrent problem currently on dilt gtt but remains in rvr to the 110s. Dig level subtherapeutic. Recent TTE with preserved EF - continue dilt gtt, coreg , digoxin  - cardiology consult - TSH - home apixaban   # HFpEF with exacerbation CXR with infiltrate, may be pulm edema. Recent TTE with preserved EF. BNP is elevated. No chest pain and troponins only minimally elevated - continue lasix  40 IV bid for home 40 daily  # Pulmonary infiltrates Admitting provider suspects pneumonia but no w/u ordered - continue levofloxacin  - check covid and rvp,  sputum for culture, urine antigens  # Hypokalemia - replete, f/u Mg  # Dysuria # OAB History recurrent UTIs. Appears to have some baseline dementia, wonder whether this is a true infection. No fever or leukocytosis - urine culture not ordered on admission, will attempt to add on - followed by urology, will continue mirabegron  but obviously but this can increase risk of uti  # Hypothyroid - cont home levo - f/u tsh  # T2DM Euglycemic - SSI  # HTN Bp appropriate - home losartan  on hold  # GAD - home paroxetine   # CAS S/p CEA - home plavix , statin    DVT prophylaxis: apixaban  Code Status: dnr/dni as per admitting provider Family Communication: none at bedside  Level of care: Progressive Status is: Inpatient Remains inpatient appropriate because: severity of illness    Consultants:  cardiology  Procedures: None thus far  Antimicrobials:  levofloxacin     Subjective: Reports ongoing dysuria, no fever. And cough  Objective: Vitals:   01/30/24 0300 01/30/24 0317 01/30/24 0400 01/30/24 0700  BP: 136/65  133/62 120/84  Pulse: 62 79 85 79  Resp: 18  17 16   Temp:  98.3 F (36.8 C)    TempSrc:  Oral    SpO2: 92% 92% 93% 94%  Weight:      Height:        Intake/Output Summary (Last 24 hours) at 01/30/2024 0852 Last data filed at 01/29/2024 2300 Gross per 24 hour  Intake --  Output 2000 ml  Net -2000 ml   Filed Weights   01/29/24 1703  Weight: 63.5 kg  Examination:  General exam: Appears calm and comfortable  Respiratory system: scattered rales throughout Cardiovascular system: S1 & S2 heard, irreg irreg, tachy Gastrointestinal system: Abdomen is nondistended, soft and nontender.  Central nervous system: Alert and oriented to self, place, moving all 4 Extremities: Symmetric 5 x 5 power. Trace LE edema Skin: No rashes, lesions or ulcers Psychiatry: some confusion    Data Reviewed: I have personally reviewed following labs and imaging  studies  CBC: Recent Labs  Lab 01/29/24 1701  WBC 7.3  NEUTROABS 5.2  HGB 11.7*  HCT 37.0  MCV 98.4  PLT 193   Basic Metabolic Panel: Recent Labs  Lab 01/29/24 1701 01/30/24 0536  NA 137 137  K 4.3 3.3*  CL 103 100  CO2 26 30  GLUCOSE 191* 141*  BUN 14 12  CREATININE 0.55 0.60  CALCIUM  9.2 8.9  MG 1.5*  --    GFR: Estimated Creatinine Clearance: 45.4 mL/min (by C-G formula based on SCr of 0.6 mg/dL). Liver Function Tests: Recent Labs  Lab 01/29/24 1701  AST 33  ALT 8  ALKPHOS 49  BILITOT 1.4*  PROT 6.2*  ALBUMIN 2.6*   No results for input(s): "LIPASE", "AMYLASE" in the last 168 hours. No results for input(s): "AMMONIA" in the last 168 hours. Coagulation Profile: No results for input(s): "INR", "PROTIME" in the last 168 hours. Cardiac Enzymes: No results for input(s): "CKTOTAL", "CKMB", "CKMBINDEX", "TROPONINI" in the last 168 hours. BNP (last 3 results) No results for input(s): "PROBNP" in the last 8760 hours. HbA1C: No results for input(s): "HGBA1C" in the last 72 hours. CBG: No results for input(s): "GLUCAP" in the last 168 hours. Lipid Profile: No results for input(s): "CHOL", "HDL", "LDLCALC", "TRIG", "CHOLHDL", "LDLDIRECT" in the last 72 hours. Thyroid Function Tests: No results for input(s): "TSH", "T4TOTAL", "FREET4", "T3FREE", "THYROIDAB" in the last 72 hours. Anemia Panel: No results for input(s): "VITAMINB12", "FOLATE", "FERRITIN", "TIBC", "IRON ", "RETICCTPCT" in the last 72 hours. Urine analysis:    Component Value Date/Time   COLORURINE AMBER (A) 01/29/2024 1701   APPEARANCEUR CLOUDY (A) 01/29/2024 1701   APPEARANCEUR Hazy (A) 05/09/2023 1300   LABSPEC 1.024 01/29/2024 1701   LABSPEC 1.005 12/29/2013 0119   PHURINE 5.0 01/29/2024 1701   GLUCOSEU NEGATIVE 01/29/2024 1701   GLUCOSEU Negative 12/29/2013 0119   HGBUR NEGATIVE 01/29/2024 1701   BILIRUBINUR NEGATIVE 01/29/2024 1701   BILIRUBINUR Negative 05/09/2023 1300   BILIRUBINUR  Negative 12/29/2013 0119   KETONESUR NEGATIVE 01/29/2024 1701   PROTEINUR >=300 (A) 01/29/2024 1701   NITRITE NEGATIVE 01/29/2024 1701   LEUKOCYTESUR LARGE (A) 01/29/2024 1701   LEUKOCYTESUR 1+ 12/29/2013 0119   Sepsis Labs: @LABRCNTIP (procalcitonin:4,lacticidven:4)  ) Recent Results (from the past 240 hours)  Blood Culture (routine x 2)     Status: None (Preliminary result)   Collection Time: 01/29/24  7:24 PM   Specimen: BLOOD  Result Value Ref Range Status   Specimen Description BLOOD RIGHT ANTECUBITAL  Final   Special Requests   Final    BOTTLES DRAWN AEROBIC AND ANAEROBIC Blood Culture results may not be optimal due to an excessive volume of blood received in culture bottles   Culture   Final    NO GROWTH < 12 HOURS Performed at [facility], [address] [place], [place] [address]    Report Status PENDING  Incomplete  Resp panel by RT-PCR (RSV, Flu A&B, Covid) Anterior Nasal Swab     Status: None   Collection Time: 01/29/24  7:35 PM  Specimen: Anterior Nasal Swab  Result Value Ref Range Status   SARS Coronavirus 2 by RT PCR NEGATIVE NEGATIVE Final    Comment: (NOTE) SARS-CoV-2 target nucleic acids are NOT DETECTED.  The SARS-CoV-2 RNA is generally detectable in upper respiratory specimens during the acute phase of infection. The lowest concentration of SARS-CoV-2 viral copies this assay can detect is 138 copies/mL. A negative result does not preclude SARS-Cov-2 infection and should not be used as the sole basis for treatment or other patient management decisions. A negative result may occur with  improper specimen collection/handling, submission of specimen other than nasopharyngeal swab, presence of viral mutation(s) within the areas targeted by this assay, and inadequate number of viral copies(<138 copies/mL). A negative result must be combined with clinical observations, patient history, and epidemiological information. The expected result is  Negative.  Fact Sheet for Patients:  BloggerCourse.com  Fact Sheet for Healthcare Providers:  SeriousBroker.it  This test is no t yet approved or cleared by the [place]  FDA and  has been authorized for detection and/or diagnosis of SARS-CoV-2 by FDA under an Emergency Use Authorization (EUA). This EUA will remain  in effect (meaning this test can be used) for the duration of the COVID-19 declaration under Section 564(b)(1) of the Act, 21 U.S.C.section 360bbb-3(b)(1), unless the authorization is terminated  or revoked sooner.       Influenza A by PCR NEGATIVE NEGATIVE Final   Influenza B by PCR NEGATIVE NEGATIVE Final    Comment: (NOTE) The Xpert Xpress SARS-CoV-2/FLU/RSV plus assay is intended as an aid in the diagnosis of influenza from Nasopharyngeal swab specimens and should not be used as a sole basis for treatment. Nasal washings and aspirates are unacceptable for Xpert Xpress SARS-CoV-2/FLU/RSV testing.  Fact Sheet for Patients: BloggerCourse.com  Fact Sheet for Healthcare Providers: SeriousBroker.it  This test is not yet approved or cleared by the [place]  FDA and has been authorized for detection and/or diagnosis of SARS-CoV-2 by FDA under an Emergency Use Authorization (EUA). This EUA will remain in effect (meaning this test can be used) for the duration of the COVID-19 declaration under Section 564(b)(1) of the Act, 21 U.S.C. section 360bbb-3(b)(1), unless the authorization is terminated or revoked.     Resp Syncytial Virus by PCR NEGATIVE NEGATIVE Final    Comment: (NOTE) Fact Sheet for Patients: BloggerCourse.com  Fact Sheet for Healthcare Providers: SeriousBroker.it  This test is not yet approved or cleared by the [place]  FDA and has been authorized for detection and/or diagnosis of  SARS-CoV-2 by FDA under an Emergency Use Authorization (EUA). This EUA will remain in effect (meaning this test can be used) for the duration of the COVID-19 declaration under Section 564(b)(1) of the Act, 21 U.S.C. section 360bbb-3(b)(1), unless the authorization is terminated or revoked.  Performed at [facility], [address] [place], [place] [address]   Blood Culture (routine x 2)     Status: None (Preliminary result)   Collection Time: 01/29/24  9:20 PM   Specimen: BLOOD  Result Value Ref Range Status   Specimen Description BLOOD RIGHT ANTECUBITAL  Final   Special Requests   Final    BOTTLES DRAWN AEROBIC AND ANAEROBIC Blood Culture results may not be optimal due to an excessive volume of blood received in culture bottles   Culture   Final    NO GROWTH < 12 HOURS Performed at [facility], [address] [place], [place] [address]    Report Status PENDING  Incomplete  Radiology Studies: DG Chest Port 1 View Result Date: 01/29/2024 CLINICAL DATA:  Shortness of breath EXAM: PORTABLE CHEST 1 VIEW COMPARISON:  01/08/2024 FINDINGS: Heart and mediastinal contours within normal limits. Patchy bilateral airspace opacities, right greater than left. No visible significant effusions. No acute bony abnormality. IMPRESSION: Patchy bilateral airspace opacities concerning for infection. Electronically Signed   By: Janeece Mechanic M.D.   On: 01/29/2024 17:31        Scheduled Meds:  apixaban   5 mg Oral BID   busPIRone   10 mg Oral BID   carvedilol   50 mg Oral BID WC   digoxin   0.125 mg Oral Daily   furosemide   40 mg Intravenous Daily   irbesartan   150 mg Oral Daily   levothyroxine   75 mcg Oral Q0600   mirabegron  ER  25 mg Oral Daily   pantoprazole   20 mg Oral Daily   [START ON 01/31/2024] PARoxetine   40 mg Oral QPM   rosuvastatin   5 mg Oral Daily   sodium chloride  flush  3 mL Intravenous Q12H   Continuous Infusions:  sodium chloride      diltiazem   (CARDIZEM ) infusion 7.5 mg/hr (01/30/24 0417)   levofloxacin  (LEVAQUIN ) IV       LOS: 1 day     Raymonde Calico, MD [facility]   If 7PM-7AM, please contact night-coverage [email or web address] Password [email or web address] 01/30/2024, 8:52 AM

## 2024-01-30 NOTE — Evaluation (Signed)
 Physical Therapy Evaluation Patient Details Name: Mary [name] MRN: [id number] DOB: February 10, 1935 Today's Date: 01/30/2024  History of Present Illness  Pt is an 89 y.o. female admitted for AFIB with RVR, hypokalemia, & hypomagnesemia. PMH significant for PAF on Eliquis , HTN, CAD, asthma/COPD, chronic HFpEF, HLD, RA, sleep apnea, spinal stenosis, recurrent nosebleed and lumbar DDD. Recently returned home from STR 5 days ago.  Clinical Impression  Pt is a pleasant 89 year old female who was admitted for AFIB. Pt performs bed mobility with min A requiring some verbal and tactile cueing on position of UE to facilitate mobility. Transfers to the recliner proved successful as pt required min A for proper use of RW to maximize safety and decrease possibility of a LOB. Ambulation was performed with a RW and  CGA, with minimal verbal and tactile cueing on manipulation of RW, no LOB experienced. Throughout transitions pt's vitals were as followed: SaO2 on 2L at rest = 96%, on 2L while ambulating = 97%, starting HR= 66 bpm, HR after ambulating 15' = 73 bpm. Pt demonstrates deficits with overall activity tolerance and generalized weakness, as pt thinks she is close to her baseline but not quite. Pt would benefit from skilled PT interventions to address deficits noted above and safely return to PLOF. PT to follow acutely.        If plan is discharge home, recommend the following: A little help with walking and/or transfers;A little help with bathing/dressing/bathroom;Assistance with cooking/housework;Assist for transportation;Help with stairs or ramp for entrance   Can travel by private vehicle        Equipment Recommendations None recommended by PT  Recommendations for Other Services       Functional Status Assessment Patient has had a recent decline in their functional status and demonstrates the ability to make significant improvements in function in a reasonable and predictable amount of time.      Precautions / Restrictions Precautions Precautions: Fall Recall of Precautions/Restrictions: Intact Restrictions Weight Bearing Restrictions Per Provider Order: No      Mobility  Bed Mobility Overal bed mobility: Needs Assistance Bed Mobility: Supine to Sit     Supine to sit: Min assist, HOB elevated     General bed mobility comments: provided verbal and tactile cues on placement of UE when transitioning    Transfers Overall transfer level: Needs assistance Equipment used: Rolling walker (2 wheels) Transfers: Sit to/from Stand, Bed to chair/wheelchair/BSC Sit to Stand: Contact guard assist   Step pivot transfers: Min assist       General transfer comment: Min A with verbal and tactile cues on placement of UEs    Ambulation/Gait Ambulation/Gait assistance: Contact guard assist Gait Distance (Feet): 15 Feet Assistive device: Rolling walker (2 wheels) Gait Pattern/deviations: Step-through pattern, Decreased step length - right, Decreased step length - left       General Gait Details: monitoring vitals throughout, HR during ambulation 73 bpm   SaO2 on 2L at rest = 96% SaO2 on 2L while ambulating = 97% Starting HR= 66 bpm HR after ambulating 15' = 73  Stairs            Wheelchair Mobility     Tilt Bed    Modified Rankin (Stroke Patients Only)       Balance Overall balance assessment: Needs assistance Sitting-balance support: Single extremity supported, Feet supported Sitting balance-Leahy Scale: Fair Sitting balance - Comments: initial verbal cue given for placement of UE but pt able to shift weight seated EOB w/out LOB  Standing balance support: Bilateral upper extremity supported Standing balance-Leahy Scale: Fair Standing balance comment: verbal and tactile cue given intitally on manipulation of RW                             Pertinent Vitals/Pain Pain Assessment Pain Assessment: Faces Faces Pain Scale: Hurts a little  bit Pain Location: L knee Pain Descriptors / Indicators: Cramping Pain Intervention(s): Monitored during session    Home Living Family/patient expects to be discharged to:: Private residence Living Arrangements: Children Available Help at Discharge: Family;Available PRN/intermittently Type of Home: House Home Access: Stairs to enter Entrance Stairs-Rails: Can reach both;Right;Left Entrance Stairs-Number of Steps: 3+1   Home Layout: One level Home Equipment: Agricultural consultant (2 wheels);Shower seat;Grab bars - toilet;Grab bars - tub/shower Additional Comments: Pt lives with her daughter who is able to provide 24/7 care    Prior Function Prior Level of Function : Independent/Modified Independent               ADLs Comments: Independent     Extremity/Trunk Assessment   Upper Extremity Assessment Upper Extremity Assessment: Overall WFL for tasks assessed    Lower Extremity Assessment Lower Extremity Assessment: Generalized weakness    Cervical / Trunk Assessment Cervical / Trunk Assessment: Kyphotic  Communication   Communication Communication: No apparent difficulties    Cognition Arousal: Alert Behavior During Therapy: WFL for tasks assessed/performed   PT - Cognitive impairments: No apparent impairments                         Following commands: Intact       Cueing Cueing Techniques: Verbal cues, Tactile cues     General Comments      Exercises     Assessment/Plan    PT Assessment Patient needs continued PT services  PT Problem List Decreased strength;Decreased activity tolerance;Decreased balance;Decreased knowledge of use of DME;Decreased safety awareness       PT Treatment Interventions DME instruction;Gait training;Stair training;Functional mobility training;Therapeutic activities;Therapeutic exercise;Balance training;Neuromuscular re-education    PT Goals (Current goals can be found in the Care Plan section)  Acute Rehab PT  Goals Patient Stated Goal: improve HR variability PT Goal Formulation: With patient Time For Goal Achievement: 02/13/24 Potential to Achieve Goals: Good    Frequency Min 2X/week     Co-evaluation               AM-PAC PT "6 Clicks" Mobility  Outcome Measure Help needed turning from your back to your side while in a flat bed without using bedrails?: A Little Help needed moving from lying on your back to sitting on the side of a flat bed without using bedrails?: A Little Help needed moving to and from a bed to a chair (including a wheelchair)?: A Little Help needed standing up from a chair using your arms (e.g., wheelchair or bedside chair)?: A Lot Help needed to walk in hospital room?: A Little Help needed climbing 3-5 steps with a railing? : A Lot 6 Click Score: 16    End of Session   Activity Tolerance: Patient tolerated treatment well Patient left: in chair;with call bell/phone within reach;Other (comment) (nursing notified of chair alarm not working but pt kept safe with feet elevated) Nurse Communication: Mobility status;Other (comment) PT Visit Diagnosis: Other abnormalities of gait and mobility (R26.89);Muscle weakness (generalized) (M62.81)    Time: 1610-9604 PT Time Calculation (min) (ACUTE ONLY): 30 min  Charges:   PT Evaluation $PT Eval Moderate Complexity: 1 Mod PT Treatments $Gait Training: 8-22 mins PT General Charges $$ ACUTE PT VISIT: 1 Visit       Marquis Diles Romero-Perozo, SPT  01/30/2024, 3:48 PM

## 2024-01-30 NOTE — Consult Note (Signed)
 [facility] CLINIC CARDIOLOGY CONSULT NOTE       [name] MRN: [id number] DOB/AGE: 1935-02-02 89 y.o.  Admit date: 01/29/2024 Referring Physician Dr. Hines Ludwig Primary Physician Monique Ano, MD Primary Cardiologist Dr. Larinda Plover Reason for Consultation AF RVR  HPI: Mary [name] is a 89 y.o. female  with a past medical history of paroxsymal atrial fibrillation (on Eliquis ), chronic HFpEF, coronary artery disease (on CT), hypertension, hyperlipidemia, T2DM, right carotid stenosis s/p CEA, pulmonary hypertension, COPD/asthma who presented to the ED on 01/29/2024 for shortness of breath, palpitations and lower abdominal pain. EKG in ED revealed AF RVR with rates 160s. Cardiology was consulted for further evaluation.   Patient presented to the ED with worsening SOB and palpitations for past 3 weeks. Patient recently hospitalized on 04/16 with AF RVR and AoCHFpEF with similar presentation. Work up in the ED notable for 37, potassium 4.3 creatinine 0.5 magnesium  1.5 hemoglobin 11.7, platelets 193.  Lactate within normal limits. UA suggestive of UTI. CXR with patchy bilateral airspace opacities. BNP elevated at 820.  Troponin minimally elevated and flat 19 > 20.  EKG in ED with atrial fibrillation RVR, rate 135 bpm. Patient started on IV Cardizem  drip in ED and rate controlled improved.   At the time of my evaluation this AM, patient was resting comfortably in hospital bed in no acute distress. We discussed patients sxs in further detail. Patient states she's been having worsening SOB and palpitations for past 3 weeks. Patient also endorses some lightheadedness at home. Currently patient reports her SOB feels about the same and denies any chest pain or palpitations.   Review of systems complete and found to be negative unless listed above    Past Medical History:  Diagnosis Date   Acid reflux    Anemia    Arthritis    Asthma    Atherosclerosis of abdominal aorta (HCC)    B12  deficiency    Clostridium difficile infection    H/O   Coronary artery disease    DDD (degenerative disc disease), lumbar    Depression    Diabetes (HCC)    type 2   Diabetic retinopathy (HCC)    Dysrhythmia    Heart murmur    HLD (hyperlipidemia)    HTN (hypertension)    Hypotension    when get up too quickly   Hypothyroidism    Pneumonia    PONV (postoperative nausea and vomiting)    Pulmonary nodule    right middle lobe on CT scan   Pure hypercholesterolemia    RA (rheumatoid arthritis) (HCC)    Sleep apnea    Spinal stenosis    Urinary urgency     Past Surgical History:  Procedure Laterality Date   ABDOMINAL HYSTERECTOMY     CATARACT EXTRACTION, BILATERAL     COLONOSCOPY N/A 07/29/2021   Procedure: COLONOSCOPY;  Surgeon: Toledo, Alphonsus Jeans, MD;  Location: [facility] ENDOSCOPY;  Service: Gastroenterology;  Laterality: N/A;   CYSTOSCOPY     ENDARTERECTOMY Right 12/02/2021   Procedure: ENDARTERECTOMY CAROTID;  Surgeon: Celso College, MD;  Location: [facility] ORS;  Service: Vascular;  Laterality: Right;   ESOPHAGOGASTRODUODENOSCOPY N/A 07/29/2021   Procedure: ESOPHAGOGASTRODUODENOSCOPY (EGD);  Surgeon: Toledo, Alphonsus Jeans, MD;  Location: [facility] ENDOSCOPY;  Service: Gastroenterology;  Laterality: N/A;  DM   HIP ARTHROPLASTY Right 09/24/2015   Procedure: ARTHROPLASTY BIPOLAR HIP (HEMIARTHROPLASTY);  Surgeon: Elner Hahn, MD;  Location: [facility] ORS;  Service: Orthopedics;  Laterality: Right;   HIP ARTHROPLASTY  Left 09/29/2018   Procedure: ARTHROPLASTY BIPOLAR HIP (HEMIARTHROPLASTY) LEFT;  Surgeon: Elner Hahn, MD;  Location: [facility] ORS;  Service: Orthopedics;  Laterality: Left;   KNEE ARTHROSCOPY W/ AUTOGENOUS CARTILAGE IMPLANTATION (ACI) PROCEDURE     TOTAL KNEE ARTHROPLASTY Left 12/01/2017   Procedure: TOTAL KNEE ARTHROPLASTY;  Surgeon: Elner Hahn, MD;  Location: [facility] ORS;  Service: Orthopedics;  Laterality: Left;   TOTAL KNEE ARTHROPLASTY Right 2014   VARICOSE VEIN SURGERY Left    leg     Medications Prior to Admission  Medication Sig Dispense Refill Last Dose/Taking   clopidogrel  (PLAVIX ) 75 MG tablet Take 75 mg by mouth daily.   Taking   losartan  (COZAAR ) 25 MG tablet Take 1 tablet by mouth daily.   Taking   acetaminophen  (TYLENOL ) 500 MG tablet Take 500 mg by mouth every 6 (six) hours as needed for mild pain (pain score 1-3).      apixaban  (ELIQUIS ) 5 MG TABS tablet Take 1 tablet (5 mg total) by mouth 2 (two) times daily.      azelastine (OPTIVAR) 0.05 % ophthalmic solution Place 1 drop into both eyes daily as needed (allergies).      busPIRone  (BUSPAR ) 10 MG tablet Take 10 mg by mouth 2 (two) times daily.      carvedilol  (COREG ) 25 MG tablet Take 2 tablets (50 mg total) by mouth 2 (two) times daily with a meal.      carvedilol  (COREG ) 25 MG tablet Take 25 mg by mouth 2 (two) times daily with a meal.      cyclobenzaprine  (FLEXERIL ) 5 MG tablet Take 1 tablet (5 mg total) by mouth 3 (three) times daily as needed for muscle spasms.      digoxin  (LANOXIN ) 0.125 MG tablet Take 1 tablet (0.125 mg total) by mouth daily.      furosemide  (LASIX ) 40 MG tablet Take 1 tablet (40 mg total) by mouth daily.      irbesartan  (AVAPRO ) 150 MG tablet Take 1 tablet (150 mg total) by mouth daily.      Iron -Vitamin C  (VITRON-C) 65-125 MG TABS Take 1 tablet by mouth daily. 90 tablet 1    levothyroxine  (SYNTHROID , LEVOTHROID) 75 MCG tablet Take 75 mcg by mouth daily.       metFORMIN  (GLUCOPHAGE -XR) 500 MG 24 hr tablet Take 500 mg by mouth daily. With dinner      pantoprazole  (PROTONIX ) 20 MG tablet Take 20 mg by mouth daily.      PARoxetine  (PAXIL ) 40 MG tablet Take 40 mg by mouth every evening.      potassium chloride  SA (KLOR-CON  M) 20 MEQ tablet Take 20 mEq by mouth 2 (two) times daily.  Take 1 tablet (20 mEq total) by mouth 2 (two) times daily With lasix       rosuvastatin  (CRESTOR ) 5 MG tablet Take 1 tablet (5 mg total) by mouth daily. 30 tablet 0    traZODone  (DESYREL ) 50 MG tablet Take 0.5  tablets (25 mg total) by mouth at bedtime as needed for sleep.      Vibegron  (GEMTESA ) 75 MG TABS Take 1 tablet (75 mg total) by mouth daily at 6 (six) AM.      vitamin B-12 (CYANOCOBALAMIN ) 500 MCG tablet Take 500 mcg by mouth every other day.      Social History   Socioeconomic History   Marital status: Widowed    Spouse name: Not on file   Number of children: Not on file   Years of education: Not  on file   Highest education level: Not on file  Occupational History   Occupation: retired  Tobacco Use   Smoking status: Never   Smokeless tobacco: Never  Vaping Use   Vaping status: Never Used  Substance and Sexual Activity   Alcohol  use: No    Alcohol /week: 0.0 standard drinks of alcohol    Drug use: No   Sexual activity: Not Currently  Other Topics Concern   Not on file  Social History Narrative   Lives with daughter   Social Drivers of Health   Financial Resource Strain: Low Risk  (09/28/2023)   Received from [facility]   Overall Financial Resource Strain (CARDIA)    Difficulty of Paying Living Expenses: Not hard at all  Food Insecurity: No Food Insecurity (01/08/2024)   Hunger Vital Sign    Worried About Running Out of Food in the Last Year: Never true    Ran Out of Food in the Last Year: Never true  Transportation Needs: No Transportation Needs (01/08/2024)   PRAPARE - Administrator, Civil Service (Medical): No    Lack of Transportation (Non-Medical): No  Physical Activity: Not on file  Stress: Not on file  Social Connections: Moderately Isolated (01/08/2024)   Social Connection and Isolation Panel [NHANES]    Frequency of Communication with Friends and Family: More than three times a week    Frequency of Social Gatherings with Friends and Family: More than three times a week    Attends Religious Services: 1 to 4 times per year    Active Member of [employer] or Organizations: No    Attends Banker Meetings: Never    Marital  Status: Widowed  Intimate Partner Violence: Not At Risk (01/08/2024)   Humiliation, Afraid, Rape, and Kick questionnaire    Fear of Current or Ex-Partner: No    Emotionally Abused: No    Physically Abused: No    Sexually Abused: No    Family History  Problem Relation Age of Onset   Kidney disease Brother        also nephew   Heart disease Mother    Heart disease Father    Prostate cancer Neg Hx    Bladder Cancer Neg Hx    Breast cancer Neg Hx    Kidney cancer Neg Hx      Vitals:   01/30/24 1146 01/30/24 1147 01/30/24 1148 01/30/24 1149  BP:      Pulse: 76 81 69 65  Resp: (!) 28 (!) 27 (!) 22 (!) 22  Temp:      TempSrc:      SpO2: 92% 94% 96% 91%  Weight:      Height:        PHYSICAL EXAM General: well appearing elderly female, well nourished, in no acute distress. HEENT: Normocephalic and atraumatic. Neck: No JVD.   Lungs: Normal respiratory effort on 2L [place]. Diminished breath sounds bilaterally. Heart: Irregularly, irregular, rate controlled. Normal S1 and S2 without gallops or murmurs.  Abdomen: Non-distended appearing.  Msk: Normal strength and tone for age. Extremities: Warm and well perfused. No clubbing, cyanosis. No edema.  Neuro: Alert and oriented X 3. Psych: Answers questions appropriately.   Labs: Basic Metabolic Panel: Recent Labs    01/29/24 1701 01/30/24 0536  NA 137 137  K 4.3 3.3*  CL 103 100  CO2 26 30  GLUCOSE 191* 141*  BUN 14 12  CREATININE 0.55 0.60  CALCIUM  9.2 8.9  MG 1.5*  1.7   Liver Function Tests: Recent Labs    01/29/24 1701  AST 33  ALT 8  ALKPHOS 49  BILITOT 1.4*  PROT 6.2*  ALBUMIN 2.6*   No results for input(s): "LIPASE", "AMYLASE" in the last 72 hours. CBC: Recent Labs    01/29/24 1701  WBC 7.3  NEUTROABS 5.2  HGB 11.7*  HCT 37.0  MCV 98.4  PLT 193   Cardiac Enzymes: Recent Labs    01/29/24 1701 01/29/24 1924  TROPONINIHS 19* 20*   BNP: Recent Labs    01/29/24 1701  BNP 823.2*   D-Dimer: No  results for input(s): "DDIMER" in the last 72 hours. Hemoglobin A1C: No results for input(s): "HGBA1C" in the last 72 hours. Fasting Lipid Panel: No results for input(s): "CHOL", "HDL", "LDLCALC", "TRIG", "CHOLHDL", "LDLDIRECT" in the last 72 hours. Thyroid Function Tests: Recent Labs    01/30/24 0536  TSH 4.510*   Anemia Panel: No results for input(s): "VITAMINB12", "FOLATE", "FERRITIN", "TIBC", "IRON ", "RETICCTPCT" in the last 72 hours.   Radiology: [facility] Chest Port 1 View Result Date: 01/29/2024 CLINICAL DATA:  Shortness of breath EXAM: PORTABLE CHEST 1 VIEW COMPARISON:  01/08/2024 FINDINGS: Heart and mediastinal contours within normal limits. Patchy bilateral airspace opacities, right greater than left. No visible significant effusions. No acute bony abnormality. IMPRESSION: Patchy bilateral airspace opacities concerning for infection. Electronically Signed   By: Janeece Mechanic M.D.   On: 01/29/2024 17:31   DG Chest 1 View Result Date: 01/08/2024 CLINICAL DATA:  Congestive heart failure. EXAM: CHEST  1 VIEW COMPARISON:  01/07/2024 FINDINGS: Low volume film. The cardio pericardial silhouette is enlarged. There is pulmonary vascular congestion without overt pulmonary edema. Trace blunting of the left costophrenic angle raises the question of tiny effusion. No acute bony abnormality. Telemetry leads overlie the chest. IMPRESSION: Low volume film with pulmonary vascular congestion. Possible tiny left pleural effusion. Electronically Signed   By: Donnal Fusi M.D.   On: 01/08/2024 07:22   DG Cervical Spine 2 or 3 views Result Date: 01/07/2024 CLINICAL DATA:  16109 Neck pain 60454 EXAM: CERVICAL SPINE - 2-3 VIEW COMPARISON:  December 07, 2023. FINDINGS: The cervical spine is visualized from C1-C7. Straightening of the cervical lordosis without significant spondylolisthesis. Vertebral body heights are mantle maintained: No evidence of acute fracture. Moderate multilevel intervertebral disc space height  loss throughout the cervical spine most pronounced at C4-5, C5-6 and C6-7. Multilevel facet arthropathy and uncovertebral hypertrophy. Limited assessment of the atlantooccipital interval due to overlapping tissue. No prevertebral soft tissue swelling. Prominent contour of the LEFT superior hilum. IMPRESSION: 1. Moderate multilevel degenerative changes of the cervical spine. 2. There is a prominent contour of the LEFT superior hilum. This could reflect a similar prominent pulmonary artery, the lymphadenopathy or space-occupying lesion remains in the differential. Consider further evaluation with dedicated chest CT if clinically indicated. Electronically Signed   By: Clancy Crimes M.D.   On: 01/07/2024 16:44   DG Chest Portable 1 View Result Date: 01/07/2024 CLINICAL DATA:  CP EXAM: PORTABLE CHEST 1 VIEW COMPARISON:  December 09, 2023 FINDINGS: The cardiomediastinal silhouette is unchanged in contour.Atherosclerotic calcifications. Trace LEFT pleural effusion. No pneumothorax. No acute pleuroparenchymal abnormality. IMPRESSION: Trace LEFT pleural effusion. Electronically Signed   By: Clancy Crimes M.D.   On: 01/07/2024 14:49    ECHO 12/08/2023  1. Left ventricular ejection fraction, by estimation, is 60 to 65%. The left ventricle has normal function. The left ventricle has no regional wall motion abnormalities. Left ventricular  diastolic parameters are indeterminate.   2. Right ventricular systolic function is normal. The right ventricular size is normal.   3. Left atrial size was mildly dilated.   4. Large pleural effusion.   5. The mitral valve is normal in structure. Trivial mitral valve regurgitation. No evidence of mitral stenosis.   6. The aortic valve is normal in structure. Aortic valve regurgitation is not visualized. No aortic stenosis is present.   7. The inferior vena cava is normal in size with greater than 50% respiratory variability, suggesting right atrial pressure of 3 mmHg.    TELEMETRY reviewed by me 01/30/2024: atrial fibrillation, rate 70-80s  EKG reviewed by me: atrial fibrillation RVR, rate 135 bpm  Data reviewed by me 01/30/2024: last 24h vitals tele labs imaging I/O ED provider note, admission H&P.  Principal Problem:   CHF (congestive heart failure) (HCC) Active Problems:   Diabetes mellitus without complication (HCC)   Hypothyroidism   Asthma   Coronary artery disease involving native coronary artery of native heart   Renal artery stenosis (HCC)   Hypertension, renovascular   A-fib (HCC)    ASSESSMENT AND PLAN:   Mary [name] is a 89 y.o. female  with a past medical history of paroxsymal atrial fibrillation (on Eliquis ), chronic HFpEF, coronary artery disease (on CT), hypertension, hyperlipidemia, T2DM, right carotid stenosis s/p CEA, pulmonary hypertension, COPD/asthma who presented to the ED on 01/29/2024 for shortness of breath, palpitations and lower abdominal pain. EKG in ED revealed AF RVR with rates 160s. Patient recently hospitalized on 04/16 with AF RVR and AoCHFpEF with similar presentation.  Cardiology was consulted for further evaluation.   # Paroxymal atrial fibrillation  Patient reports palpitations, SOB for past 3 weeks and presents on admission with AF RVR in 160s. IV Caredizem gtt started in ED. Per tele patient remains in AF with improved HR in 70-80s. -Monitor and replenish electrolytes for a goal K >4, Mag >2  -Continue Eliquis  5 mg twice daily for stroke risk reduction.  -Continue PO digoxin  0.125 mg daily. -Transitioned IV dilt to PO dilt 60 mg 4x daily.  -Beta blocker as stated below.  # Acute on chronic HFpEF Patient presents with worsening SOB for past 3 weeks. CXR with patchy bilateral airspace opacities. BNP elevated at 820.Echo from 11/2023 with pEF (60-65%), no RWMA. -Continue IV lasix  40 mg BID. Closely monitor UOP, electrolytes, renal function. -Decreased Coreg  to 25 mg twice daily (Max dose is 25 mg BID). -Consider  SGLT2i if renal function allows.   # Coronary artery disease # Hypertension # Hyperlipidemia Patient denies chest pain. Troponin minimally elevated and flat 19 > 20.  EKG in ED with atrial fibrillation RVR, rate 135 bpm. -Continue Plavix  75 mg daily, rosuvastatin  5 mg daily.  -Beta blocker as stated above. -Home ARB held due to borderline low BP and allow room for uptitration of diltiazem  for rate control.  -Minimally elevated and flat trops in setting of AF RVR and AOCHFpEF is most consistent with demand/supply mismatch and not ACS    This patient's plan of care was discussed and created with Dr. Braxton Calico and she is in agreement.  Signed: Creighton Doffing, PA-C  01/30/2024, 12:15 PM [facility] Cardiology

## 2024-01-30 NOTE — Progress Notes (Signed)
 Heart Failure Navigator Progress Note  Assessed for Heart & Vascular TOC clinic readiness.  Patient does not meet criteria due to current [facility] patient.   Navigator will sign off at this time.  Roxy Horseman, RN, BSN [facility] Heart Failure Navigator Secure Chat Only

## 2024-01-31 DIAGNOSIS — E440 Moderate protein-calorie malnutrition: Secondary | ICD-10-CM | POA: Insufficient documentation

## 2024-01-31 DIAGNOSIS — I5033 Acute on chronic diastolic (congestive) heart failure: Secondary | ICD-10-CM | POA: Diagnosis not present

## 2024-01-31 LAB — GLUCOSE, CAPILLARY
Glucose-Capillary: 134 mg/dL — ABNORMAL HIGH (ref 70–99)
Glucose-Capillary: 173 mg/dL — ABNORMAL HIGH (ref 70–99)
Glucose-Capillary: 149 mg/dL — ABNORMAL HIGH (ref 70–99)
Glucose-Capillary: 120 mg/dL — ABNORMAL HIGH (ref 70–99)

## 2024-01-31 LAB — BASIC METABOLIC PANEL WITH GFR
Anion gap: 10 (ref 5–15)
BUN: 14 mg/dL (ref 8–23)
CO2: 28 mmol/L (ref 22–32)
Calcium: 9.1 mg/dL (ref 8.9–10.3)
Chloride: 99 mmol/L (ref 98–111)
Creatinine, Ser: 0.79 mg/dL (ref 0.44–1.00)
GFR, Estimated: >60 mL/min (ref >60)
Glucose, Bld: 124 mg/dL — ABNORMAL HIGH (ref 70–99)
Potassium: 3.7 mmol/L (ref 3.5–5.1)
Sodium: 137 mmol/L (ref 135–145)

## 2024-01-31 LAB — CBC
HCT: 38 % (ref 36.0–46.0)
Hemoglobin: 12 g/dL (ref 12.0–15.0)
MCH: 31.4 pg (ref 26.0–34.0)
MCHC: 31.6 g/dL (ref 30.0–36.0)
MCV: 99.5 fL (ref 80.0–100.0)
Platelets: 189 10*3/uL (ref 150–400)
RBC: 3.82 MIL/uL — ABNORMAL LOW (ref 3.87–5.11)
RDW: 14.6 % (ref 11.5–15.5)
WBC: 6.1 10*3/uL (ref 4.0–10.5)
nRBC: 0 % (ref 0.0–0.2)

## 2024-01-31 LAB — URINE CULTURE

## 2024-01-31 LAB — PROCALCITONIN: Procalcitonin: <0.1 ng/mL

## 2024-01-31 LAB — MAGNESIUM: Magnesium: 2.2 mg/dL (ref 1.7–2.4)

## 2024-01-31 MED ORDER — ORAL CARE MOUTH RINSE: 15.0000 mL | OROMUCOSAL | Status: DC | PRN

## 2024-01-31 MED ORDER — DOXYCYCLINE HYCLATE 100 MG PO TABS
100.0000 mg | ORAL_TABLET | Freq: Two times a day (BID) | ORAL | Status: AC
  Administered 2024-01-31 – 2024-02-02 (×5): 100 mg via ORAL
  Filled 2024-01-31 (×5): qty 1

## 2024-01-31 MED ORDER — ADULT MULTIVITAMIN W/MINERALS CH
1.0000 | ORAL_TABLET | Freq: Every day | ORAL | Status: DC
  Administered 2024-01-31 – 2024-02-13 (×14): 1 via ORAL
  Filled 2024-01-31 (×15): qty 1

## 2024-01-31 MED ORDER — POTASSIUM CHLORIDE CRYS ER 20 MEQ PO TBCR
40.0000 meq | EXTENDED_RELEASE_TABLET | Freq: Once | ORAL | Status: AC
  Administered 2024-01-31: 40 meq via ORAL
  Filled 2024-01-31: qty 2

## 2024-01-31 MED ORDER — ENSURE PLUS HIGH PROTEIN PO LIQD
237.0000 mL | Freq: Two times a day (BID) | ORAL | Status: DC
  Administered 2024-01-31 – 2024-02-10 (×18): 237 mL via ORAL

## 2024-01-31 NOTE — Progress Notes (Signed)
 PHARMACIST - PHYSICIAN COMMUNICATION DR:   Sari Cunning CONCERNING: Antibiotic IV to Oral Route Change Policy  RECOMMENDATION: This patient is receiving Doxycycline  by the intravenous route.  Based on criteria approved by the Pharmacy and Therapeutics Committee, the antibiotic(s) is/are being converted to the equivalent oral dose form(s).   DESCRIPTION: These criteria include: Patient being treated for a respiratory tract infection, urinary tract infection, cellulitis or clostridium difficile associated diarrhea if on metronidazole The patient is not neutropenic and does not exhibit a GI malabsorption state The patient is eating (either orally or via tube) and/or has been taking other orally administered medications for a least 24 hours The patient is improving clinically and has a Tmax < 100.5  If you have questions about this conversion, please contact the Pharmacy Department    Caoilainn Sacks Rodriguez-Guzman PharmD, BCPS 01/31/2024 2:31 PM

## 2024-01-31 NOTE — Progress Notes (Signed)
 Initial Nutrition Assessment  DOCUMENTATION CODES:   Non-severe (moderate) malnutrition in context of chronic illness  INTERVENTION:   -Liberalize diet to 2 gram sodium for wider variety of meal selections -MVI with minerals daily -Ensure Plus High Protein po BID, each supplement provides 350 kcal and 20 grams of protein   NUTRITION DIAGNOSIS:   Moderate Malnutrition related to chronic illness (CHF) as evidenced by mild fat depletion, moderate fat depletion, mild muscle depletion, moderate muscle depletion, percent weight loss.  GOAL:   Patient will meet greater than or equal to 90% of their needs  MONITOR:   PO intake, Supplement acceptance  REASON FOR ASSESSMENT:   Malnutrition Screening Tool    ASSESSMENT:   Pt with medical history significant of PAF on Eliquis , HTN, CAD, asthma/COPD, chronic HFpEF, hypothyroidism, presented with palpitations.  Pt admitted with a-fib with RVR.   Reviewed I/O's: +1.3 L x 24 hours and -659 ml since admission  Per MD notes, pt with UTI and possible pneumonia.   Spoke with pt at bedside, who was pleasant and in good spirits today. She reports that she slept well last night and is feeling better since admission. Pt shares that she has had a poor appetite at baseline and noticed decreased oral intake over the past year. Pt has no structured eating pattern and mainly eats "whenever I feel hungry". She usually eats 2 meals per day on average and usually eats a very good breakfast (Breakfast: yogurt and muffin OR eggs, toast, and grits; Lunch: half of a sandwich and water ; Dinner: meat, starch, and vegetable). Pt reports she will often skip dinner as she does not feel hungry.   Pt reports tolerating breakfast well, eating blueberry muffin and yogurt at time of visit.   Pt reports her UBW was around 160#, which she weighed most of her adult life. She endorses progressive wt loss over the last few years, but unsure how much. Reviewed wt hx; pt has  experienced a 11.1% wt loss over the past 2 months, which is significant for time frame.   Discussed importance of good meal and supplement intake to promote healing. Pt amenable to supplements, stating she was thinking about purchasing some PTA.   Medications reviewed and include lasix  and protonix .   Lab Results  Component Value Date   HGBA1C 6.1 (H) 07/24/2023   PTA DM medications are 500 mg metformin  daily.   Labs reviewed: CBGS: 125-207 (inpatient orders for glycemic control are 0-5 units insulin  aspart daily at bedtime and 0-9 units insulin  aspart TID with meals).    NUTRITION - FOCUSED PHYSICAL EXAM:  Flowsheet Row Most Recent Value  Orbital Region Moderate depletion  Upper Arm Region Moderate depletion  Thoracic and Lumbar Region Mild depletion  Buccal Region Mild depletion  Temple Region Mild depletion  Clavicle Bone Region Moderate depletion  Clavicle and Acromion Bone Region Moderate depletion  Scapular Bone Region Moderate depletion  Dorsal Hand Moderate depletion  Patellar Region Moderate depletion  Anterior Thigh Region Moderate depletion  Posterior Calf Region Moderate depletion  Edema (RD Assessment) None  Hair Reviewed  Eyes Reviewed  Mouth Reviewed  Skin Reviewed  Nails Reviewed       Diet Order:   Diet Order             Diet 2 gram sodium Fluid consistency: Thin  Diet effective now                   EDUCATION NEEDS:   Education needs  have been addressed  Skin:  Skin Assessment: Reviewed RN Assessment  Last BM:  Unknown  Height:   Ht Readings from Last 1 Encounters:  01/29/24 5\' 5"  (1.651 m)    Weight:   Wt Readings from Last 1 Encounters:  01/31/24 63.8 kg    Ideal Body Weight:  56.8 kg  BMI:  Body mass index is 23.41 kg/m.  Estimated Nutritional Needs:   Kcal:  1700-1900  Protein:  85-100 grams  Fluid:  1.7-1.9 L    Herschel Lords, RD, LDN, CDCES Registered Dietitian III Certified Diabetes Care and Education  Specialist If unable to reach this RD, please use "RD Inpatient" group chat on secure chat between hours of 8am-4 pm daily

## 2024-01-31 NOTE — Plan of Care (Signed)

## 2024-01-31 NOTE — Progress Notes (Signed)
 [facility] CLINIC CARDIOLOGY PROGRESS NOTE       [name] MRN: [id number] DOB/AGE: 1934/11/16 89 y.o.  Admit date: 01/29/2024 Referring Physician Dr. Hines Ludwig Primary Physician Monique Ano, MD Primary Cardiologist Dr. Larinda Plover Reason for Consultation AF RVR  HPI: Mary [name] is a 89 y.o. female  with a past medical history of paroxsymal atrial fibrillation (on Eliquis ), chronic HFpEF, coronary artery disease (on CT), hypertension, hyperlipidemia, T2DM, right carotid stenosis s/p CEA, pulmonary hypertension, COPD/asthma who presented to the ED on 01/29/2024 for shortness of breath, palpitations and lower abdominal pain. EKG in ED revealed AF RVR with rates 160s. Cardiology was consulted for further evaluation.   Interval History: -Patient seen and examined this AM and laying comfortably in hospital bed. Patient states she feels well and reports SOB is much better and denies chest pain or palpitations.   -Patients BP improved and HR stable this AM.  -Per tele remains in AF rates 80-90s. -No documented UOP in chart. Patient reports great UOP. Renal function is stable -Patient remains on 2L [place] with stable SpO2.    Review of systems complete and found to be negative unless listed above    Past Medical History:  Diagnosis Date   Acid reflux    Anemia    Arthritis    Asthma    Atherosclerosis of abdominal aorta (HCC)    B12 deficiency    Clostridium difficile infection    H/O   Coronary artery disease    DDD (degenerative disc disease), lumbar    Depression    Diabetes (HCC)    type 2   Diabetic retinopathy (HCC)    Dysrhythmia    Heart murmur    HLD (hyperlipidemia)    HTN (hypertension)    Hypotension    when get up too quickly   Hypothyroidism    Pneumonia    PONV (postoperative nausea and vomiting)    Pulmonary nodule    right middle lobe on CT scan   Pure hypercholesterolemia    RA (rheumatoid arthritis) (HCC)    Sleep apnea    Spinal  stenosis    Urinary urgency     Past Surgical History:  Procedure Laterality Date   ABDOMINAL HYSTERECTOMY     CATARACT EXTRACTION, BILATERAL     COLONOSCOPY N/A 07/29/2021   Procedure: COLONOSCOPY;  Surgeon: Toledo, Alphonsus Jeans, MD;  Location: [facility];  Service: Gastroenterology;  Laterality: N/A;   CYSTOSCOPY     ENDARTERECTOMY Right 12/02/2021   Procedure: ENDARTERECTOMY CAROTID;  Surgeon: Celso College, MD;  Location: [facility] ORS;  Service: Vascular;  Laterality: Right;   ESOPHAGOGASTRODUODENOSCOPY N/A 07/29/2021   Procedure: ESOPHAGOGASTRODUODENOSCOPY (EGD);  Surgeon: Toledo, Alphonsus Jeans, MD;  Location: [facility] END[facility];  Service: Gastroenterology;  Laterality: N/A;  DM   HIP ARTHROPLASTY Right 09/24/2015   Procedure: ARTHROPLASTY BIPOLAR HIP (HEMIARTHROPLASTY);  Surgeon: Elner Hahn, MD;  Location: [facility] ORS;  Service: Orthopedics;  Laterality: Right;   HIP ARTHROPLASTY Left 09/29/2018   Procedure: ARTHROPLASTY BIPOLAR HIP (HEMIARTHROPLASTY) LEFT;  Surgeon: Elner Hahn, MD;  Location: [facility] ORS;  Service: Orthopedics;  Laterality: Left;   KNEE ARTHROSCOPY W/ AUTOGENOUS CARTILAGE IMPLANTATION (ACI) PROCEDURE     TOTAL KNEE ARTHROPLASTY Left 12/01/2017   Procedure: TOTAL KNEE ARTHROPLASTY;  Surgeon: Elner Hahn, MD;  Location: [facility] ORS;  Service: Orthopedics;  Laterality: Left;   TOTAL KNEE ARTHROPLASTY Right 2014   VARICOSE VEIN SURGERY Left    leg    Medications Prior  to Admission  Medication Sig Dispense Refill Last Dose/Taking   acetaminophen  (TYLENOL ) 500 MG tablet Take 500 mg by mouth every 6 (six) hours as needed for mild pain (pain score 1-3).   Taking As Needed   azelastine (OPTIVAR) 0.05 % ophthalmic solution Place 1 drop into both eyes daily as needed (allergies).   Taking As Needed   busPIRone  (BUSPAR ) 10 MG tablet Take 10 mg by mouth 2 (two) times daily.   01/29/2024   carvedilol  (COREG ) 6.25 MG tablet Take 6.25 mg by mouth 2 (two) times daily with a meal.   01/29/2024    cyclobenzaprine  (FLEXERIL ) 5 MG tablet Take 1 tablet (5 mg total) by mouth 3 (three) times daily as needed for muscle spasms.   Taking As Needed   furosemide  (LASIX ) 40 MG tablet Take 1 tablet (40 mg total) by mouth daily.   Past Week   Iron -Vitamin C  (VITRON-C) 65-125 MG TABS Take 1 tablet by mouth daily. 90 tablet 1 Past Week   levothyroxine  (SYNTHROID , LEVOTHROID) 75 MCG tablet Take 75 mcg by mouth daily.    01/29/2024   losartan  (COZAAR ) 25 MG tablet Take 1 tablet by mouth daily.   Taking   metFORMIN  (GLUCOPHAGE -XR) 500 MG 24 hr tablet Take 500 mg by mouth daily. With dinner   01/29/2024   pantoprazole  (PROTONIX ) 20 MG tablet Take 20 mg by mouth daily.   01/29/2024   PARoxetine  (PAXIL ) 40 MG tablet Take 40 mg by mouth every evening.   01/29/2024   potassium chloride  SA (KLOR-CON  M) 20 MEQ tablet Take 20 mEq by mouth 2 (two) times daily.  Take 1 tablet (20 mEq total) by mouth 2 (two) times daily With lasix    01/29/2024   rosuvastatin  (CRESTOR ) 5 MG tablet Take 1 tablet (5 mg total) by mouth daily. 30 tablet 0 Past Week   vitamin B-12 (CYANOCOBALAMIN ) 500 MCG tablet Take 500 mcg by mouth every other day.   01/29/2024   apixaban  (ELIQUIS ) 5 MG TABS tablet Take 1 tablet (5 mg total) by mouth 2 (two) times daily. (Patient not taking: Reported on 01/30/2024)   Not Taking   carvedilol  (COREG ) 25 MG tablet Take 2 tablets (50 mg total) by mouth 2 (two) times daily with a meal. (Patient not taking: Reported on 01/30/2024)   Not Taking   carvedilol  (COREG ) 25 MG tablet Take 25 mg by mouth 2 (two) times daily with a meal. (Patient not taking: Reported on 01/30/2024)   Not Taking   clopidogrel  (PLAVIX ) 75 MG tablet Take 75 mg by mouth daily. (Patient not taking: Reported on 01/30/2024)   Not Taking   digoxin  (LANOXIN ) 0.125 MG tablet Take 1 tablet (0.125 mg total) by mouth daily. (Patient not taking: Reported on 01/30/2024)   Not Taking   irbesartan  (AVAPRO ) 150 MG tablet Take 1 tablet (150 mg total) by mouth daily. (Patient not  taking: Reported on 01/30/2024)   Not Taking   traZODone  (DESYREL ) 50 MG tablet Take 0.5 tablets (25 mg total) by mouth at bedtime as needed for sleep. (Patient not taking: Reported on 01/30/2024)   Not Taking   Vibegron  (GEMTESA ) 75 MG TABS Take 1 tablet (75 mg total) by mouth daily at 6 (six) AM. (Patient not taking: Reported on 01/30/2024)   Not Taking   Social History   Socioeconomic History   Marital status: Widowed    Spouse name: Not on file   Number of children: Not on file   Years of education: Not on file  Highest education level: Not on file  Occupational History   Occupation: retired  Tobacco Use   Smoking status: Never   Smokeless tobacco: Never  Vaping Use   Vaping status: Never Used  Substance and Sexual Activity   Alcohol  use: No    Alcohol /week: 0.0 standard drinks of alcohol    Drug use: No   Sexual activity: Not Currently  Other Topics Concern   Not on file  Social History Narrative   Lives with daughter   Social Drivers of Health   Financial Resource Strain: Low Risk  (09/28/2023)   Received from [facility]   Overall Financial Resource Strain (CARDIA)    Difficulty of Paying Living Expenses: Not hard at all  Food Insecurity: No Food Insecurity (01/30/2024)   Hunger Vital Sign    Worried About Running Out of Food in the Last Year: Never true    Ran Out of Food in the Last Year: Never true  Transportation Needs: No Transportation Needs (01/30/2024)   PRAPARE - Administrator, Civil Service (Medical): No    Lack of Transportation (Non-Medical): No  Physical Activity: Not on file  Stress: Not on file  Social Connections: Moderately Isolated (01/30/2024)   Social Connection and Isolation Panel [NHANES]    Frequency of Communication with Friends and Family: More than three times a week    Frequency of Social Gatherings with Friends and Family: More than three times a week    Attends Religious Services: 1 to 4 times per year    Active  Member of [employer] or Organizations: No    Attends Banker Meetings: Never    Marital Status: Widowed  Intimate Partner Violence: Not At Risk (01/30/2024)   Humiliation, Afraid, Rape, and Kick questionnaire    Fear of Current or Ex-Partner: No    Emotionally Abused: No    Physically Abused: No    Sexually Abused: No    Family History  Problem Relation Age of Onset   Kidney disease Brother        also nephew   Heart disease Mother    Heart disease Father    Prostate cancer Neg Hx    Bladder Cancer Neg Hx    Breast cancer Neg Hx    Kidney cancer Neg Hx      Vitals:   01/31/24 0000 01/31/24 0408 01/31/24 0523 01/31/24 0747  BP: 131/67 130/70    Pulse: 87 69    Resp: (!) 31 18    Temp: 98.1 F (36.7 C) 97.9 F (36.6 C)  97.6 F (36.4 C)  TempSrc: Oral   Oral  SpO2: 96% 98%  93%  Weight:   63.8 kg   Height:        PHYSICAL EXAM General: well appearing elderly female, well nourished, in no acute distress. HEENT: Normocephalic and atraumatic. Neck: No JVD.   Lungs: Normal respiratory effort on 2L [place]. Diminished breath sounds bilaterally. Heart: Irregularly, irregular, rate controlled. Normal S1 and S2 without gallops or murmurs.  Abdomen: Non-distended appearing.  Msk: Normal strength and tone for age. Extremities: Warm and well perfused. No clubbing, cyanosis. No edema.  Neuro: Alert and oriented X 3. Psych: Answers questions appropriately.   Labs: Basic Metabolic Panel: Recent Labs    01/30/24 0536 01/31/24 0400  NA 137 137  K 3.3* 3.7  CL 100 99  CO2 30 28  GLUCOSE 141* 124*  BUN 12 14  CREATININE 0.60 0.79  CALCIUM  8.9 9.1  MG 1.7 2.2   Liver Function Tests: Recent Labs    01/29/24 1701  AST 33  ALT 8  ALKPHOS 49  BILITOT 1.4*  PROT 6.2*  ALBUMIN 2.6*   No results for input(s): "LIPASE", "AMYLASE" in the last 72 hours. CBC: Recent Labs    01/29/24 1701 01/31/24 0400  WBC 7.3 6.1  NEUTROABS 5.2  --   HGB 11.7* 12.0  HCT 37.0  38.0  MCV 98.4 99.5  PLT 193 189   Cardiac Enzymes: Recent Labs    01/29/24 1701 01/29/24 1924  TROPONINIHS 19* 20*   BNP: Recent Labs    01/29/24 1701  BNP 823.2*   D-Dimer: No results for input(s): "DDIMER" in the last 72 hours. Hemoglobin A1C: No results for input(s): "HGBA1C" in the last 72 hours. Fasting Lipid Panel: No results for input(s): "CHOL", "HDL", "LDLCALC", "TRIG", "CHOLHDL", "LDLDIRECT" in the last 72 hours. Thyroid Function Tests: Recent Labs    01/30/24 0536  TSH 4.510*   Anemia Panel: No results for input(s): "VITAMINB12", "FOLATE", "FERRITIN", "TIBC", "IRON ", "RETICCTPCT" in the last 72 hours.   Radiology: [facility] Chest Port 1 View Result Date: 01/29/2024 CLINICAL DATA:  Shortness of breath EXAM: PORTABLE CHEST 1 VIEW COMPARISON:  01/08/2024 FINDINGS: Heart and mediastinal contours within normal limits. Patchy bilateral airspace opacities, right greater than left. No visible significant effusions. No acute bony abnormality. IMPRESSION: Patchy bilateral airspace opacities concerning for infection. Electronically Signed   By: Janeece Mechanic M.D.   On: 01/29/2024 17:31   DG Chest 1 View Result Date: 01/08/2024 CLINICAL DATA:  Congestive heart failure. EXAM: CHEST  1 VIEW COMPARISON:  01/07/2024 FINDINGS: Low volume film. The cardio pericardial silhouette is enlarged. There is pulmonary vascular congestion without overt pulmonary edema. Trace blunting of the left costophrenic angle raises the question of tiny effusion. No acute bony abnormality. Telemetry leads overlie the chest. IMPRESSION: Low volume film with pulmonary vascular congestion. Possible tiny left pleural effusion. Electronically Signed   By: Donnal Fusi M.D.   On: 01/08/2024 07:22   DG Cervical Spine 2 or 3 views Result Date: 01/07/2024 CLINICAL DATA:  40981 Neck pain 19147 EXAM: CERVICAL SPINE - 2-3 VIEW COMPARISON:  December 07, 2023. FINDINGS: The cervical spine is visualized from C1-C7. Straightening  of the cervical lordosis without significant spondylolisthesis. Vertebral body heights are mantle maintained: No evidence of acute fracture. Moderate multilevel intervertebral disc space height loss throughout the cervical spine most pronounced at C4-5, C5-6 and C6-7. Multilevel facet arthropathy and uncovertebral hypertrophy. Limited assessment of the atlantooccipital interval due to overlapping tissue. No prevertebral soft tissue swelling. Prominent contour of the LEFT superior hilum. IMPRESSION: 1. Moderate multilevel degenerative changes of the cervical spine. 2. There is a prominent contour of the LEFT superior hilum. This could reflect a similar prominent pulmonary artery, the lymphadenopathy or space-occupying lesion remains in the differential. Consider further evaluation with dedicated chest CT if clinically indicated. Electronically Signed   By: Clancy Crimes M.D.   On: 01/07/2024 16:44   DG Chest Portable 1 View Result Date: 01/07/2024 CLINICAL DATA:  CP EXAM: PORTABLE CHEST 1 VIEW COMPARISON:  December 09, 2023 FINDINGS: The cardiomediastinal silhouette is unchanged in contour.Atherosclerotic calcifications. Trace LEFT pleural effusion. No pneumothorax. No acute pleuroparenchymal abnormality. IMPRESSION: Trace LEFT pleural effusion. Electronically Signed   By: Clancy Crimes M.D.   On: 01/07/2024 14:49    ECHO 12/08/2023  1. Left ventricular ejection fraction, by estimation, is 60 to 65%. The left ventricle has normal  function. The left ventricle has no regional wall motion abnormalities. Left ventricular diastolic parameters are indeterminate.   2. Right ventricular systolic function is normal. The right ventricular size is normal.   3. Left atrial size was mildly dilated.   4. Large pleural effusion.   5. The mitral valve is normal in structure. Trivial mitral valve regurgitation. No evidence of mitral stenosis.   6. The aortic valve is normal in structure. Aortic valve regurgitation  is not visualized. No aortic stenosis is present.   7. The inferior vena cava is normal in size with greater than 50% respiratory variability, suggesting right atrial pressure of 3 mmHg.   TELEMETRY reviewed by me 01/31/2024: atrial fibrillation, rate 80-90s  EKG reviewed by me: atrial fibrillation RVR, rate 135 bpm  Data reviewed by me 01/31/2024: last 24h vitals tele labs imaging I/O hospitalist progress note  Principal Problem:   CHF (congestive heart failure) (HCC) Active Problems:   Diabetes mellitus without complication (HCC)   Hypothyroidism   Asthma   Coronary artery disease involving native coronary artery of native heart   Renal artery stenosis (HCC)   Hypertension, renovascular   A-fib (HCC)    ASSESSMENT AND PLAN:   Mary [name] is a 89 y.o. female  with a past medical history of paroxsymal atrial fibrillation (on Eliquis ), chronic HFpEF, coronary artery disease (on CT), hypertension, hyperlipidemia, T2DM, right carotid stenosis s/p CEA, pulmonary hypertension, COPD/asthma who presented to the ED on 01/29/2024 for shortness of breath, palpitations and lower abdominal pain. EKG in ED revealed AF RVR with rates 160s. Patient recently hospitalized on 04/16 with AF RVR and AoCHFpEF with similar presentation.  Cardiology was consulted for further evaluation.   # Paroxymal atrial fibrillation  Patient reports palpitations, SOB for past 3 weeks and presents on admission with AF RVR in 160s. IV Caredizem gtt started in ED. Per tele patient remains in AF with improved HR in 70-80s. -Monitor and replenish electrolytes for a goal K >4, Mag >2  -Continue Eliquis  5 mg twice daily for stroke risk reduction.  -Continue PO digoxin  0.125 mg daily. -D/c dilt 60 mg 4x daily due to soft BP. Can resume for elevated HR and if BP remains stable -Beta blocker as stated below.  # Acute on chronic HFpEF Patient presents with worsening SOB for past 3 weeks. CXR with patchy bilateral airspace  opacities. BNP elevated at 820.Echo from 11/2023 with pEF (60-65%), no RWMA. -Continue IV lasix  40 mg BID. Closely monitor UOP, electrolytes, renal function. -Continue Coreg  to 25 mg twice daily (Max dose is 25 mg BID). -Consider SGLT2i if renal function allows.   # Coronary artery disease # Hypertension # Hyperlipidemia Patient denies chest pain. Troponin minimally elevated and flat 19 > 20.  EKG in ED with atrial fibrillation RVR, rate 135 bpm. -Continue Plavix  75 mg daily, rosuvastatin  5 mg daily.  -Beta blocker as stated above. -Home ARB held due to borderline low BP and allow room for uptitration of diltiazem  for rate control.  -Minimally elevated and flat trops in setting of AF RVR and AOCHFpEF is most consistent with demand/supply mismatch and not ACS    This patient's plan of care was discussed and created with Dr. Braxton Calico and she is in agreement.  Signed: Creighton Doffing, PA-C  01/31/2024, 9:36 AM [facility] Card[facility]

## 2024-01-31 NOTE — Progress Notes (Addendum)
 PROGRESS NOTE    Mary [name]  MWU:[id number] DOB: [birth year] DOA: 01/29/2024 PCP: Monique Ano, MD  Outpatient Specialists: cardiology, urology    Brief Narrative:   From admission h and p  Mary [name] is a 89 y.o. female with medical history significant of coronary disease, hypertension, paroxysmal atrial fibrillation on anticoagulation with Eliquis , HFpEF, asthma/COPD as a result of secondhand smoking, hypothyroidism She presents to the emergency room with multiple complaints that include palpitations, associated shortness of breath and lower abdominal pain and spasms.  She felt her lower abdominal symptoms were likely attributed to urinary tract infection.  She denies any fever or chills.   In the ER, patient was noted to be in A-fib with RVR.  Heart rates in the 160s.  Patient required oxygen supplementation in the ED.  Chest x-ray did reveal some vascular congestion.  Abnormal urinalysis suggesting possible UTI.   She was initiated on IV Cardizem  in the ED.  Rate is better controlled.   Assessment & Plan:   Principal Problem:   CHF (congestive heart failure) (HCC) Active Problems:   Hypothyroidism   Diabetes mellitus without complication (HCC)   Asthma   Coronary artery disease involving native coronary artery of native heart   Renal artery stenosis (HCC)   Hypertension, renovascular   A-fib (HCC)   Malnutrition of moderate degree  # A-fib with RVR Recurrent problem. Hypotension yesterday with diltiazem . Remains in RVR. Recent TTE with preserved EF. Not hyperthyroid - continue coreg , digoxin  per cardiology - home apixaban   # HFpEF with exacerbation CXR with infiltrate, may be pulm edema. Recent TTE with preserved EF. BNP is elevated. No chest pain and troponins only minimally elevated - continue lasix  40 IV bid for home 40 daily  # Pulmonary infiltrates No fever or leukocytosis. Respiratory panel negative. - continue ceftriaxone /doxy - follow cultures -  check procal, consider d/c abx if low  # Acute hypoxic respiratory failure Currently on 2 liters - nursing will d/c o2 to determine if she still needs it  # Hypokalemia Replete to maintain K above 4 and mg above 2  # Dysuria # OAB # Acute cystitis? History recurrent UTIs. Appears to have some baseline dementia, wonder whether this is a true infection. No fever or leukocytosis but does complain of dysuria that has improved with abx. Unfortunately multiple species on urine culture. Grew klebsiella sensitive to cephalosporins last year - followed by urology, will continue mirabegron  but obviously but this can increase risk of uti - on ceftriaxone  as above, can consider transition to keflex if pneumonia abx discontinued  # Hypothyroid Tsh mild elevation - cont home levo  # T2DM Euglycemic - SSI  # HTN Bp appropriate - home losartan  on hold  # GAD - home paroxetine   # CAS S/p CEA - home plavix , statin  # Debility PT/OT advise HH PT/OT (ordered)    DVT prophylaxis: apixaban  Code Status: dnr/dni as per admitting provider Family Communication: daughter Mary [name] updated telephonically 6/10  Level of care: Progressive Status is: Inpatient Remains inpatient appropriate because: severity of illness    Consultants:  cardiology  Procedures: None thus far  Antimicrobials:  See above    Subjective: Reports improvement in dyspnea and dysuria  Objective: Vitals:   01/31/24 1100 01/31/24 1115 01/31/24 1130 01/31/24 1200  BP:   (!) 96/48 106/62  Pulse:   98   Resp: (!) 26 19 18    Temp:   97.6 F (36.4 C)   TempSrc:   Oral  SpO2:   96%   Weight:      Height:        Intake/Output Summary (Last 24 hours) at 01/31/2024 1358 Last data filed at 01/31/2024 1035 Gross per 24 hour  Intake 1341.11 ml  Output --  Net 1341.11 ml   Filed Weights   01/29/24 1703 01/31/24 0523  Weight: 63.5 kg 63.8 kg    Examination:  General exam: Appears calm and comfortable   Respiratory system: scattered rales throughout, worse at bases Cardiovascular system: S1 & S2 heard, irreg irreg, tachy Gastrointestinal system: Abdomen is nondistended, soft and nontender.  Central nervous system: Alert and oriented to self, place, moving all 4 Extremities: Symmetric 5 x 5 power. Trace LE edema Skin: No rashes, lesions or ulcers Psychiatry: some confusion    Data Reviewed: I have personally reviewed following labs and imaging studies  CBC: Recent Labs  Lab 01/29/24 1701 01/31/24 0400  WBC 7.3 6.1  NEUTROABS 5.2  --   HGB 11.7* 12.0  HCT 37.0 38.0  MCV 98.4 99.5  PLT 193 189   Basic Metabolic Panel: Recent Labs  Lab 01/29/24 1701 01/30/24 0536 01/31/24 0400  NA 137 137 137  K 4.3 3.3* 3.7  CL 103 100 99  CO2 26 30 28   GLUCOSE 191* 141* 124*  BUN 14 12 14   CREATININE 0.55 0.60 0.79  CALCIUM  9.2 8.9 9.1  MG 1.5* 1.7 2.2   GFR: Estimated Creatinine Clearance: 45.4 mL/min (by C-G formula based on SCr of 0.79 mg/dL). Liver Function Tests: Recent Labs  Lab 01/29/24 1701  AST 33  ALT 8  ALKPHOS 49  BILITOT 1.4*  PROT 6.2*  ALBUMIN 2.6*   No results for input(s): "LIPASE", "AMYLASE" in the last 168 hours. No results for input(s): "AMMONIA" in the last 168 hours. Coagulation Profile: No results for input(s): "INR", "PROTIME" in the last 168 hours. Cardiac Enzymes: No results for input(s): "CKTOTAL", "CKMB", "CKMBINDEX", "TROPONINI" in the last 168 hours. BNP (last 3 results) No results for input(s): "PROBNP" in the last 8760 hours. HbA1C: No results for input(s): "HGBA1C" in the last 72 hours. CBG: Recent Labs  Lab 01/30/24 1150 01/30/24 1626 01/30/24 2049 01/31/24 0749 01/31/24 1140  GLUCAP 207* 125* 130* 134* 173*   Lipid Profile: No results for input(s): "CHOL", "HDL", "LDLCALC", "TRIG", "CHOLHDL", "LDLDIRECT" in the last 72 hours. Thyroid Function Tests: Recent Labs    01/30/24 0536  TSH 4.510*   Anemia Panel: No results  for input(s): "VITAMINB12", "FOLATE", "FERRITIN", "TIBC", "IRON ", "RETICCTPCT" in the last 72 hours. Urine analysis:    Component Value Date/Time   COLORURINE AMBER (A) 01/29/2024 1701   APPEARANCEUR CLOUDY (A) 01/29/2024 1701   APPEARANCEUR Hazy (A) 05/09/2023 1300   LABSPEC 1.024 01/29/2024 1701   LABSPEC 1.005 12/29/2013 0119   PHURINE 5.0 01/29/2024 1701   GLUCOSEU NEGATIVE 01/29/2024 1701   GLUCOSEU Negative 12/29/2013 0119   HGBUR NEGATIVE 01/29/2024 1701   BILIRUBINUR NEGATIVE 01/29/2024 1701   BILIRUBINUR Negative 05/09/2023 1300   BILIRUBINUR Negative 12/29/2013 0119   KETONESUR NEGATIVE 01/29/2024 1701   PROTEINUR >=300 (A) 01/29/2024 1701   NITRITE NEGATIVE 01/29/2024 1701   LEUKOCYTESUR LARGE (A) 01/29/2024 1701   LEUKOCYTESUR 1+ 12/29/2013 0119   Sepsis Labs: @LABRCNTIP (procalcitonin:4,lacticidven:4)  ) Recent Results (from the past 240 hours)  Urine Culture (for pregnant, neutropenic or urologic patients or patients with an indwelling urinary catheter)     Status: Abnormal   Collection Time: 01/29/24  5:01 PM   Specimen:  Urine, Clean Catch  Result Value Ref Range Status   Specimen Description   Final    URINE, CLEAN CATCH Performed at [facility], [address] [place], [place] [address]    Special Requests   Final    NONE Performed at [facility], [address] [place], [place] [address]    Culture MULTIPLE SPECIES PRESENT, SUGGEST RECOLLECTION (A)  Final   Report Status 01/31/2024 FINAL  Final  Blood Culture (routine x 2)     Status: None (Preliminary result)   Collection Time: 01/29/24  7:24 PM   Specimen: BLOOD  Result Value Ref Range Status   Specimen Description BLOOD RIGHT ANTECUBITAL  Final   Special Requests   Final    BOTTLES DRAWN AEROBIC AND ANAEROBIC Blood Culture results may not be optimal due to an excessive volume of blood received in culture bottles   Culture   Final    NO GROWTH 2 DAYS Performed at [facility], [address], [place], [place] [address]    Report Status PENDING  Incomplete  Resp panel by RT-PCR (RSV, Flu A&B, Covid) Anterior Nasal Swab     Status: None   Collection Time: 01/29/24  7:35 PM   Specimen: Anterior Nasal Swab  Result Value Ref Range Status   SARS Coronavirus 2 by RT PCR NEGATIVE NEGATIVE Final    Comment: (NOTE) SARS-CoV-2 target nucleic acids are NOT DETECTED.  The SARS-CoV-2 RNA is generally detectable in upper respiratory specimens during the acute phase of infection. The lowest concentration of SARS-CoV-2 viral copies this assay can detect is 138 copies/mL. A negative result does not preclude SARS-Cov-2 infection and should not be used as the sole basis for treatment or other patient management decisions. A negative result may occur with  improper specimen collection/handling, submission of specimen other than nasopharyngeal swab, presence of viral mutation(s) within the areas targeted by this assay, and inadequate number of viral copies(<138 copies/mL). A negative result must be combined with clinical observations, patient history, and epidemiological information. The expected result is Negative.  Fact Sheet for Patients:  BloggerCourse.com  Fact Sheet for Healthcare Providers:  SeriousBroker.it  This test is no t yet approved or cleared by the [place]  FDA and  has been authorized for detection and/or diagnosis of SARS-CoV-2 by FDA under an Emergency Use Authorization (EUA). This EUA will remain  in effect (meaning this test can be used) for the duration of the COVID-19 declaration under Section 564(b)(1) of the Act, 21 U.S.C.section 360bbb-3(b)(1), unless the authorization is terminated  or revoked sooner.       Influenza A by PCR NEGATIVE NEGATIVE Final   Influenza B by PCR NEGATIVE NEGATIVE Final    Comment: (NOTE) The Xpert Xpress SARS-CoV-2/FLU/RSV plus assay is intended as  an aid in the diagnosis of influenza from Nasopharyngeal swab specimens and should not be used as a sole basis for treatment. Nasal washings and aspirates are unacceptable for Xpert Xpress SARS-CoV-2/FLU/RSV testing.  Fact Sheet for Patients: BloggerCourse.com  Fact Sheet for Healthcare Providers: SeriousBroker.it  This test is not yet approved or cleared by the [place]  FDA and has been authorized for detection and/or diagnosis of SARS-CoV-2 by FDA under an Emergency Use Authorization (EUA). This EUA will remain in effect (meaning this test can be used) for the duration of the COVID-19 declaration under Section 564(b)(1) of the Act, 21 U.S.C. section 360bbb-3(b)(1), unless the authorization is terminated or revoked.     Resp Syncytial  Virus by PCR NEGATIVE NEGATIVE Final    Comment: (NOTE) Fact Sheet for Patients: BloggerCourse.com  Fact Sheet for Healthcare Providers: SeriousBroker.it  This test is not yet approved or cleared by the [place]  FDA and has been authorized for detection and/or diagnosis of SARS-CoV-2 by FDA under an Emergency Use Authorization (EUA). This EUA will remain in effect (meaning this test can be used) for the duration of the COVID-19 declaration under Section 564(b)(1) of the Act, 21 U.S.C. section 360bbb-3(b)(1), unless the authorization is terminated or revoked.  Performed at [facility], [address] [place], [place] [address]   Blood Culture (routine x 2)     Status: None (Preliminary result)   Collection Time: 01/29/24  9:20 PM   Specimen: BLOOD  Result Value Ref Range Status   Specimen Description BLOOD RIGHT ANTECUBITAL  Final   Special Requests   Final    BOTTLES DRAWN AEROBIC AND ANAEROBIC Blood Culture results may not be optimal due to an excessive volume of blood received in culture bottles   Culture   Final     NO GROWTH 2 DAYS Performed at [facility], [address], [place], [place] [address]    Report Status PENDING  Incomplete  Respiratory (~20 pathogens) panel by PCR     Status: None   Collection Time: 01/30/24  9:10 AM   Specimen: Nasopharyngeal Swab; Respiratory  Result Value Ref Range Status   Adenovirus NOT DETECTED NOT DETECTED Final   Coronavirus 229E NOT DETECTED NOT DETECTED Final    Comment: (NOTE) The Coronavirus on the Respiratory Panel, DOES NOT test for the novel  Coronavirus (2019 nCoV)    Coronavirus HKU1 NOT DETECTED NOT DETECTED Final   Coronavirus NL63 NOT DETECTED NOT DETECTED Final   Coronavirus OC43 NOT DETECTED NOT DETECTED Final   Metapneumovirus NOT DETECTED NOT DETECTED Final   Rhinovirus / Enterovirus NOT DETECTED NOT DETECTED Final   Influenza A NOT DETECTED NOT DETECTED Final   Influenza B NOT DETECTED NOT DETECTED Final   Parainfluenza Virus 1 NOT DETECTED NOT DETECTED Final   Parainfluenza Virus 2 NOT DETECTED NOT DETECTED Final   Parainfluenza Virus 3 NOT DETECTED NOT DETECTED Final   Parainfluenza Virus 4 NOT DETECTED NOT DETECTED Final   Respiratory Syncytial Virus NOT DETECTED NOT DETECTED Final   Bordetella pertussis NOT DETECTED NOT DETECTED Final   Bordetella Parapertussis NOT DETECTED NOT DETECTED Final   Chlamydophila pneumoniae NOT DETECTED NOT DETECTED Final   Mycoplasma pneumoniae NOT DETECTED NOT DETECTED Final    Comment: Performed at [facility], [address], [place], [place] [address]         Radiology Studies: DG Chest Port 1 View Result Date: 01/29/2024 CLINICAL DATA:  Shortness of breath EXAM: PORTABLE CHEST 1 VIEW COMPARISON:  01/08/2024 FINDINGS: Heart and mediastinal contours within normal limits. Patchy bilateral airspace opacities, right greater than left. No visible significant effusions. No acute bony abnormality. IMPRESSION: Patchy bilateral airspace opacities concerning for infection. Electronically  Signed   By: Janeece Mechanic M.D.   On: 01/29/2024 17:31        Scheduled Meds:  apixaban   5 mg Oral BID   busPIRone   10 mg Oral BID   carvedilol   25 mg Oral BID WC   clopidogrel   75 mg Oral Daily   digoxin   0.125 mg Oral Daily   feeding supplement  237 mL Oral BID BM   furosemide   40 mg Intravenous BID   insulin   aspart  0-5 Units Subcutaneous QHS   insulin  aspart  0-9 Units Subcutaneous TID WC   levothyroxine   75 mcg Oral Q0600   mirabegron  ER  25 mg Oral Daily   multivitamin with minerals  1 tablet Oral Daily   pantoprazole   20 mg Oral Daily   PARoxetine   40 mg Oral QPM   rosuvastatin   5 mg Oral Daily   sodium chloride  flush  3 mL Intravenous Q12H   Continuous Infusions:  cefTRIAXone  (ROCEPHIN )  IV 1 g (01/30/24 2250)   doxycycline  (VIBRAMYCIN ) IV 100 mg (01/31/24 0957)     LOS: 2 days     Mary Calico, MD [facility]   If 7PM-7AM, please contact night-coverage [email or web address] Password [email or web address] 01/31/2024, 1:58 PM

## 2024-01-31 NOTE — Progress Notes (Signed)
 Physical Therapy Treatment Patient Details Name: Mary [name] MRN: [id number] DOB: 1934-09-10 Today's Date: 01/31/2024   History of Present Illness Pt is an 89 y.o. female admitted for AFIB with RVR, hypokalemia, & hypomagnesemia. PMH significant for PAF on Eliquis , HTN, CAD, asthma/COPD, chronic HFpEF, HLD, RA, sleep apnea, spinal stenosis, recurrent nosebleed and lumbar DDD. Recently returned home from STR 5 days ago.    PT Comments  Pt in chair..  RN in room to remove O2 for weaning attempt.  Pt is able to stand and progress gait 30' with RW and min a x 1 for general safety and equipment management.  While pt does not c/o fatigue of SOB HR increased to 130's with gait and sats decreased to high 70/low 80's.  Given time in sitting to recover but she does not and O2 is replaced at 2 lpm for return to baseline.  RN in and aware/discussed and will relay to MD as appropriate.  Pt stated she did feel she was walking similar to baseline.     If plan is discharge home, recommend the following: A little help with walking and/or transfers;A little help with bathing/dressing/bathroom;Assistance with cooking/housework;Assist for transportation;Help with stairs or ramp for entrance   Can travel by private vehicle        Equipment Recommendations  None recommended by PT    Recommendations for Other Services       Precautions / Restrictions Precautions Precautions: Fall Recall of Precautions/Restrictions: Intact Restrictions Weight Bearing Restrictions Per Provider Order: No     Mobility  Bed Mobility               General bed mobility comments: in chair before and after Patient Response: Cooperative  Transfers Overall transfer level: Needs assistance Equipment used: Rolling walker (2 wheels) Transfers: Sit to/from Stand Sit to Stand: Contact guard assist                Ambulation/Gait Ambulation/Gait assistance: Contact guard assist, Min assist Gait Distance (Feet): 30  Feet Assistive device: Rolling walker (2 wheels) Gait Pattern/deviations: Step-through pattern Gait velocity: decreased     General Gait Details: +1 for safety and managing lines/leads   Stairs             Wheelchair Mobility     Tilt Bed Tilt Bed Patient Response: Cooperative  Modified Rankin (Stroke Patients Only)       Balance Overall balance assessment: Needs assistance Sitting-balance support: Single extremity supported, Feet supported Sitting balance-Leahy Scale: Good     Standing balance support: Bilateral upper extremity supported Standing balance-Leahy Scale: Fair                              Hotel manager: No apparent difficulties  Cognition Arousal: Alert Behavior During Therapy: WFL for tasks assessed/performed   PT - Cognitive impairments: No apparent impairments                         Following commands: Intact      Cueing Cueing Techniques: Verbal cues, Tactile cues  Exercises      General Comments        Pertinent Vitals/Pain Pain Assessment Pain Assessment: No/denies pain    Home Living                          Prior Function  PT Goals (current goals can now be found in the care plan section) Progress towards PT goals: Progressing toward goals    Frequency    Min 2X/week      PT Plan      Co-evaluation              AM-PAC PT "6 Clicks" Mobility   Outcome Measure  Help needed turning from your back to your side while in a flat bed without using bedrails?: A Little Help needed moving from lying on your back to sitting on the side of a flat bed without using bedrails?: A Little Help needed moving to and from a bed to a chair (including a wheelchair)?: A Little Help needed standing up from a chair using your arms (e.g., wheelchair or bedside chair)?: A Little Help needed to walk in hospital room?: A Little Help needed climbing 3-5 steps  with a railing? : A Little 6 Click Score: 18    End of Session Equipment Utilized During Treatment: Gait belt Activity Tolerance: Treatment limited secondary to medical complications (Comment) Patient left: in chair;with call bell/phone within reach;with chair alarm set Nurse Communication: Mobility status;Other (comment) PT Visit Diagnosis: Other abnormalities of gait and mobility (R26.89);Muscle weakness (generalized) (M62.81)     Time: 4098-1191 PT Time Calculation (min) (ACUTE ONLY): 15 min  Charges:    $Gait Training: 8-22 mins PT General Charges $$ ACUTE PT VISIT: 1 Visit                   Charlanne Cong, PTA 01/31/24, 2:29 PM

## 2024-01-31 NOTE — Care Management Important Message (Signed)
 Important Message  Patient Details  Name: Mary [name] MRN: [id number] Date of Birth: [birth year]   Important Message Given:  Yes - Medicare IM     Anise Kerns 01/31/2024, 4:38 PM

## 2024-02-01 DIAGNOSIS — I480 Paroxysmal atrial fibrillation: Secondary | ICD-10-CM | POA: Diagnosis not present

## 2024-02-01 LAB — BASIC METABOLIC PANEL WITH GFR
Anion gap: 9 (ref 5–15)
BUN: 23 mg/dL (ref 8–23)
CO2: 30 mmol/L (ref 22–32)
Calcium: 9 mg/dL (ref 8.9–10.3)
Chloride: 97 mmol/L — ABNORMAL LOW (ref 98–111)
Creatinine, Ser: 0.66 mg/dL (ref 0.44–1.00)
GFR, Estimated: >60 mL/min (ref >60)
Glucose, Bld: 134 mg/dL — ABNORMAL HIGH (ref 70–99)
Potassium: 4 mmol/L (ref 3.5–5.1)
Sodium: 136 mmol/L (ref 135–145)

## 2024-02-01 LAB — CBC
HCT: 36 % (ref 36.0–46.0)
Hemoglobin: 11.6 g/dL — ABNORMAL LOW (ref 12.0–15.0)
MCH: 31.8 pg (ref 26.0–34.0)
MCHC: 32.2 g/dL (ref 30.0–36.0)
MCV: 98.6 fL (ref 80.0–100.0)
Platelets: 210 10*3/uL (ref 150–400)
RBC: 3.65 MIL/uL — ABNORMAL LOW (ref 3.87–5.11)
RDW: 14.6 % (ref 11.5–15.5)
WBC: 8.8 10*3/uL (ref 4.0–10.5)
nRBC: 0 % (ref 0.0–0.2)

## 2024-02-01 LAB — LEGIONELLA PNEUMOPHILA SEROGP 1 UR AG: L. pneumophila Serogp 1 Ur Ag: NEGATIVE

## 2024-02-01 LAB — MAGNESIUM: Magnesium: 1.7 mg/dL (ref 1.7–2.4)

## 2024-02-01 LAB — GLUCOSE, CAPILLARY
Glucose-Capillary: 128 mg/dL — ABNORMAL HIGH (ref 70–99)
Glucose-Capillary: 184 mg/dL — ABNORMAL HIGH (ref 70–99)
Glucose-Capillary: 144 mg/dL — ABNORMAL HIGH (ref 70–99)
Glucose-Capillary: 178 mg/dL — ABNORMAL HIGH (ref 70–99)

## 2024-02-01 LAB — HEMOGLOBIN A1C
Hgb A1c MFr Bld: 7 % — ABNORMAL HIGH (ref 4.8–5.6)
Mean Plasma Glucose: 154 mg/dL

## 2024-02-01 MED ORDER — AMIODARONE HCL 200 MG PO TABS
200.0000 mg | ORAL_TABLET | Freq: Two times a day (BID) | ORAL | Status: AC
  Administered 2024-02-01 – 2024-02-10 (×20): 200 mg via ORAL
  Filled 2024-02-01 (×20): qty 1

## 2024-02-01 MED ORDER — AMIODARONE HCL 200 MG PO TABS
200.0000 mg | ORAL_TABLET | Freq: Every day | ORAL | Status: DC
  Administered 2024-02-11 – 2024-02-13 (×3): 200 mg via ORAL
  Filled 2024-02-01 (×3): qty 1

## 2024-02-01 MED ORDER — MAGNESIUM SULFATE 2 GM/50ML IV SOLN
2.0000 g | Freq: Once | INTRAVENOUS | Status: AC
  Administered 2024-02-01: 2 g via INTRAVENOUS
  Filled 2024-02-01: qty 50

## 2024-02-01 NOTE — Progress Notes (Signed)
 [facility] CLINIC CARDIOLOGY PROGRESS NOTE       [name] MRN: [id number] DOB/AGE: 87/11/1934 87 y.o.  Admit date: 01/29/2024 Referring Physician Dr. Hines Ludwig Primary Physician Monique Ano, MD Primary Cardiologist Dr. Larinda Plover Reason for Consultation AF RVR  HPI: Mary [name] is a 89 y.o. female  with a past medical history of paroxsymal atrial fibrillation (on Eliquis ), chronic HFpEF, coronary artery disease (on CT), hypertension, hyperlipidemia, T2DM, right carotid stenosis s/p CEA, pulmonary hypertension, COPD/asthma who presented to the ED on 01/29/2024 for shortness of breath, palpitations and lower abdominal pain. EKG in ED revealed AF RVR with rates 160s. Cardiology was consulted for further evaluation.   Interval History: -Patient seen and examined this AM and laying comfortably in hospital bed. Patient states she feels much better and improved SOB as well. Denies chest pain or palpitations.   -Patients BP borderline low and HR elevated today -Per tele remains in AF rates 90s-100s  -UOP yesterday 1.1L with stable renal function.   -Patient remains on 2L [place] with stable SpO2.    Review of systems complete and found to be negative unless listed above    Past Medical History:  Diagnosis Date   Acid reflux    Anemia    Arthritis    Asthma    Atherosclerosis of abdominal aorta (HCC)    B12 deficiency    Clostridium difficile infection    H/O   Coronary artery disease    DDD (degenerative disc disease), lumbar    Depression    Diabetes (HCC)    type 2   Diabetic retinopathy (HCC)    Dysrhythmia    Heart murmur    HLD (hyperlipidemia)    HTN (hypertension)    Hypotension    when get up too quickly   Hypothyroidism    Pneumonia    PONV (postoperative nausea and vomiting)    Pulmonary nodule    right middle lobe on CT scan   Pure hypercholesterolemia    RA (rheumatoid arthritis) (HCC)    Sleep apnea    Spinal stenosis    Urinary urgency      Past Surgical History:  Procedure Laterality Date   ABDOMINAL HYSTERECTOMY     CATARACT EXTRACTION, BILATERAL     COLONOSCOPY N/A 07/29/2021   Procedure: COLONOSCOPY;  Surgeon: Toledo, Alphonsus Jeans, MD;  Location: [facility];  Service: Gastroenterology;  Laterality: N/A;   CYSTOSCOPY     ENDARTERECTOMY Right 12/02/2021   Procedure: ENDARTERECTOMY CAROTID;  Surgeon: Celso College, MD;  Location: [facility] ORS;  Service: Vascular;  Laterality: Right;   ESOPHAGOGASTRODUODENOSCOPY N/A 07/29/2021   Procedure: ESOPHAGOGASTRODUODENOSCOPY (EGD);  Surgeon: Toledo, Alphonsus Jeans, MD;  Location: [facility];  Service: Gastroenterology;  Laterality: N/A;  DM   HIP ARTHROPLASTY Right 09/24/2015   Procedure: ARTHROPLASTY BIPOLAR HIP (HEMIARTHROPLASTY);  Surgeon: Elner Hahn, MD;  Location: [facility] ORS;  Service: Orthopedics;  Laterality: Right;   HIP ARTHROPLASTY Left 09/29/2018   Procedure: ARTHROPLASTY BIPOLAR HIP (HEMIARTHROPLASTY) LEFT;  Surgeon: Elner Hahn, MD;  Location: [facility] OR[facility];  Service: Orthopedics;  Laterality: Left;   KNEE ARTHROSCOPY W/ AUTOGENOUS CARTILAGE IMPLANTATION (ACI) PROCEDURE     TOTAL KNEE ARTHROPLASTY Left 12/01/2017   Procedure: TOTAL KNEE ARTHROPLASTY;  Surgeon: Elner Hahn, MD;  Location: [facility] OR[facility];  Service: Orthopedics;  Laterality: Left;   TOTAL KNEE ARTHROPLASTY Right 2014   VARICOSE VEIN SURGERY Left    leg    Medications Prior to Admission  Medication Sig  Dispense Refill Last Dose/Taking   acetaminophen  (TYLENOL ) 500 MG tablet Take 500 mg by mouth every 6 (six) hours as needed for mild pain (pain score 1-3).   Taking As Needed   azelastine (OPTIVAR) 0.05 % ophthalmic solution Place 1 drop into both eyes daily as needed (allergies).   Taking As Needed   busPIRone  (BUSPAR ) 10 MG tablet Take 10 mg by mouth 2 (two) times daily.   01/29/2024   carvedilol  (COREG ) 6.25 MG tablet Take 6.25 mg by mouth 2 (two) times daily with a meal.   01/29/2024   cyclobenzaprine  (FLEXERIL ) 5 MG  tablet Take 1 tablet (5 mg total) by mouth 3 (three) times daily as needed for muscle spasms.   Taking As Needed   furosemide  (LASIX ) 40 MG tablet Take 1 tablet (40 mg total) by mouth daily.   Past Week   Iron -Vitamin C  (VITRON-C) 65-125 MG TABS Take 1 tablet by mouth daily. 90 tablet 1 Past Week   levothyroxine  (SYNTHROID , LEVOTHROID) 75 MCG tablet Take 75 mcg by mouth daily.    01/29/2024   losartan  (COZAAR ) 25 MG tablet Take 1 tablet by mouth daily.   Taking   metFORMIN  (GLUCOPHAGE -XR) 500 MG 24 hr tablet Take 500 mg by mouth daily. With dinner   01/29/2024   pantoprazole  (PROTONIX ) 20 MG tablet Take 20 mg by mouth daily.   01/29/2024   PARoxetine  (PAXIL ) 40 MG tablet Take 40 mg by mouth every evening.   01/29/2024   potassium chloride  SA (KLOR-CON  M) 20 MEQ tablet Take 20 mEq by mouth 2 (two) times daily.  Take 1 tablet (20 mEq total) by mouth 2 (two) times daily With lasix    01/29/2024   rosuvastatin  (CRESTOR ) 5 MG tablet Take 1 tablet (5 mg total) by mouth daily. 30 tablet 0 Past Week   vitamin B-12 (CYANOCOBALAMIN ) 500 MCG tablet Take 500 mcg by mouth every other day.   01/29/2024   apixaban  (ELIQUIS ) 5 MG TABS tablet Take 1 tablet (5 mg total) by mouth 2 (two) times daily. (Patient not taking: Reported on 01/30/2024)   Not Taking   carvedilol  (COREG ) 25 MG tablet Take 2 tablets (50 mg total) by mouth 2 (two) times daily with a meal. (Patient not taking: Reported on 01/30/2024)   Not Taking   carvedilol  (COREG ) 25 MG tablet Take 25 mg by mouth 2 (two) times daily with a meal. (Patient not taking: Reported on 01/30/2024)   Not Taking   clopidogrel  (PLAVIX ) 75 MG tablet Take 75 mg by mouth daily. (Patient not taking: Reported on 01/30/2024)   Not Taking   digoxin  (LANOXIN ) 0.125 MG tablet Take 1 tablet (0.125 mg total) by mouth daily. (Patient not taking: Reported on 01/30/2024)   Not Taking   irbesartan  (AVAPRO ) 150 MG tablet Take 1 tablet (150 mg total) by mouth daily. (Patient not taking: Reported on 01/30/2024)    Not Taking   traZODone  (DESYREL ) 50 MG tablet Take 0.5 tablets (25 mg total) by mouth at bedtime as needed for sleep. (Patient not taking: Reported on 01/30/2024)   Not Taking   Vibegron  (GEMTESA ) 75 MG TABS Take 1 tablet (75 mg total) by mouth daily at 6 (six) AM. (Patient not taking: Reported on 01/30/2024)   Not Taking   Social History   Socioeconomic History   Marital status: Widowed    Spouse name: Not on file   Number of children: Not on file   Years of education: Not on file   Highest education level:  Not on file  Occupational History   Occupation: retired  Tobacco Use   Smoking status: Never   Smokeless tobacco: Never  Vaping Use   Vaping status: Never Used  Substance and Sexual Activity   Alcohol  use: No    Alcohol /week: 0.0 standard drinks of alcohol    Drug use: No   Sexual activity: Not Currently  Other Topics Concern   Not on file  Social History Narrative   Lives with daughter   Social Drivers of Health   Financial Resource Strain: Low Risk  (09/28/2023)   Received from [facility]   Overall Financial Resource Strain (CARDIA)    Difficulty of Paying Living Expenses: Not hard at all  Food Insecurity: No Food Insecurity (01/30/2024)   Hunger Vital Sign    Worried About Running Out of Food in the Last Year: Never true    Ran Out of Food in the Last Year: Never true  Transportation Needs: No Transportation Needs (01/30/2024)   PRAPARE - Administrator, Civil Service (Medical): No    Lack of Transportation (Non-Medical): No  Physical Activity: Not on file  Stress: Not on file  Social Connections: Moderately Isolated (01/30/2024)   Social Connection and Isolation Panel [NHANES]    Frequency of Communication with Friends and Family: More than three times a week    Frequency of Social Gatherings with Friends and Family: More than three times a week    Attends Religious Services: 1 to 4 times per year    Active Member of [employer] or Organizations:  No    Attends Banker Meetings: Never    Marital Status: Widowed  Intimate Partner Violence: Not At Risk (01/30/2024)   Humiliation, Afraid, Rape, and Kick questionnaire    Fear of Current or Ex-Partner: No    Emotionally Abused: No    Physically Abused: No    Sexually Abused: No    Family History  Problem Relation Age of Onset   Kidney disease Brother        also nephew   Heart disease Mother    Heart disease Father    Prostate cancer Neg Hx    Bladder Cancer Neg Hx    Breast cancer Neg Hx    Kidney cancer Neg Hx      Vitals:   02/01/24 0420 02/01/24 0430 02/01/24 0440 02/01/24 0529  BP:      Pulse: 85 98 86   Resp: (!) 26 18 20    Temp:      TempSrc:      SpO2: 97% 98% 98%   Weight:    62 kg  Height:        PHYSICAL EXAM General: well appearing elderly female, well nourished, in no acute distress. HEENT: Normocephalic and atraumatic. Neck: No JVD.   Lungs: Normal respiratory effort on 2L . Diminished breath sounds bilaterally.  Heart: Irregularly, irregular, rate controlled. Normal S1 and S2 without gallops or murmurs.  Abdomen: Non-distended appearing.  Msk: Normal strength and tone for age. Extremities: Warm and well perfused. No clubbing, cyanosis. No edema.  Neuro: Alert and oriented X 3. Psych: Answers questions appropriately.   Labs: Basic Metabolic Panel: Recent Labs    01/31/24 0400 02/01/24 0510  NA 137 136  K 3.7 4.0  CL 99 97*  CO2 28 30  GLUCOSE 124* 134*  BUN 14 23  CREATININE 0.79 0.66  CALCIUM  9.1 9.0  MG 2.2 1.7   Liver Function Tests: Recent Labs  01/29/24 1701  AST 33  ALT 8  ALKPHOS 49  BILITOT 1.4*  PROT 6.2*  ALBUMIN 2.6*   No results for input(s): LIPASE, AMYLASE in the last 72 hours. CBC: Recent Labs    01/29/24 1701 01/31/24 0400 02/01/24 0510  WBC 7.3 6.1 8.8  NEUTROABS 5.2  --   --   HGB 11.7* 12.0 11.6*  HCT 37.0 38.0 36.0  MCV 98.4 99.5 98.6  PLT 193 189 210   Cardiac  Enzymes: Recent Labs    01/29/24 1701 01/29/24 1924  TROPONINIHS 19* 20*   BNP: Recent Labs    01/29/24 1701  BNP 823.2*   D-Dimer: No results for input(s): DDIMER in the last 72 hours. Hemoglobin A1C: Recent Labs    01/31/24 0400  HGBA1C 7.0*   Fasting Lipid Panel: No results for input(s): CHOL, HDL, LDLCALC, TRIG, CHOLHDL, LDLDIRECT in the last 72 hours. Thyroid Function Tests: Recent Labs    01/30/24 0536  TSH 4.510*   Anemia Panel: No results for input(s): VITAMINB12, FOLATE, FERRITIN, TIBC, IRON , RETICCTPCT in the last 72 hours.   Radiology: [facility] Chest Port 1 View Result Date: 01/29/2024 CLINICAL DATA:  Shortness of breath EXAM: PORTABLE CHEST 1 VIEW COMPARISON:  01/08/2024 FINDINGS: Heart and mediastinal contours within normal limits. Patchy bilateral airspace opacities, right greater than left. No visible significant effusions. No acute bony abnormality. IMPRESSION: Patchy bilateral airspace opacities concerning for infection. Electronically Signed   By: Janeece Mechanic M.D.   On: 01/29/2024 17:31   DG Chest 1 View Result Date: 01/08/2024 CLINICAL DATA:  Congestive heart failure. EXAM: CHEST  1 VIEW COMPARISON:  01/07/2024 FINDINGS: Low volume film. The cardio pericardial silhouette is enlarged. There is pulmonary vascular congestion without overt pulmonary edema. Trace blunting of the left costophrenic angle raises the question of tiny effusion. No acute bony abnormality. Telemetry leads overlie the chest. IMPRESSION: Low volume film with pulmonary vascular congestion. Possible tiny left pleural effusion. Electronically Signed   By: Donnal Fusi M.D.   On: 01/08/2024 07:22   DG Cervical Spine 2 or 3 views Result Date: 01/07/2024 CLINICAL DATA:  [id number] Neck pain [id number] EXAM: CERVICAL SPINE - 2-3 VIEW COMPARISON:  December 07, 2023. FINDINGS: The cervical spine is visualized from C1-C7. Straightening of the cervical lordosis without significant  spondylolisthesis. Vertebral body heights are mantle maintained: No evidence of acute fracture. Moderate multilevel intervertebral disc space height loss throughout the cervical spine most pronounced at C4-5, C5-6 and C6-7. Multilevel facet arthropathy and uncovertebral hypertrophy. Limited assessment of the atlantooccipital interval due to overlapping tissue. No prevertebral soft tissue swelling. Prominent contour of the LEFT superior hilum. IMPRESSION: 1. Moderate multilevel degenerative changes of the cervical spine. 2. There is a prominent contour of the LEFT superior hilum. This could reflect a similar prominent pulmonary artery, the lymphadenopathy or space-occupying lesion remains in the differential. Consider further evaluation with dedicated chest CT if clinically indicated. Electronically Signed   By: Clancy Crimes M.D.   On: 01/07/2024 16:44   DG Chest Portable 1 View Result Date: 01/07/2024 CLINICAL DATA:  CP EXAM: PORTABLE CHEST 1 VIEW COMPARISON:  December 09, 2023 FINDINGS: The cardiomediastinal silhouette is unchanged in contour.Atherosclerotic calcifications. Trace LEFT pleural effusion. No pneumothorax. No acute pleuroparenchymal abnormality. IMPRESSION: Trace LEFT pleural effusion. Electronically Signed   By: Clancy Crimes M.D.   On: 01/07/2024 14:49    ECHO 12/08/2023  1. Left ventricular ejection fraction, by estimation, is 60 to 65%. The left ventricle has normal function.  The left ventricle has no regional wall motion abnormalities. Left ventricular diastolic parameters are indeterminate.   2. Right ventricular systolic function is normal. The right ventricular size is normal.   3. Left atrial size was mildly dilated.   4. Large pleural effusion.   5. The mitral valve is normal in structure. Trivial mitral valve regurgitation. No evidence of mitral stenosis.   6. The aortic valve is normal in structure. Aortic valve regurgitation is not visualized. No aortic stenosis is  present.   7. The inferior vena cava is normal in size with greater than 50% respiratory variability, suggesting right atrial pressure of 3 mmHg.   TELEMETRY reviewed by me 02/01/2024: atrial fibrillation, rate 90s-100s  EKG reviewed by me: atrial fibrillation RVR, rate 135 bpm  Data reviewed by me 02/01/2024: last 24h vitals tele labs imaging I/O hospitalist progress note  Principal Problem:   CHF (congestive heart failure) (HCC) Active Problems:   Diabetes mellitus without complication (HCC)   Hypothyroidism   Asthma   Coronary artery disease involving native coronary artery of native heart   Renal artery stenosis (HCC)   Hypertension, renovascular   A-fib (HCC)   Malnutrition of moderate degree    ASSESSMENT AND PLAN:   Mary [name] is a 89 y.o. female  with a past medical history of paroxsymal atrial fibrillation (on Eliquis ), chronic HFpEF, coronary artery disease (on CT), hypertension, hyperlipidemia, T2DM, right carotid stenosis s/p CEA, pulmonary hypertension, COPD/asthma who presented to the ED on 01/29/2024 for shortness of breath, palpitations and lower abdominal pain. EKG in ED revealed AF RVR with rates 160s. Patient recently hospitalized on 04/16 with AF RVR and AoCHFpEF with similar presentation.  Cardiology was consulted for further evaluation.   # Paroxymal atrial fibrillation  Patient reports palpitations, SOB for past 3 weeks and presents on admission with AF RVR in 160s. IV Caredizem gtt started in ED. Per tele patient remains in AF with rates 90-100s -Monitor and replenish electrolytes for a goal K >4, Mag >2  -Continue Eliquis  5 mg twice daily for stroke risk reduction.  -Continue PO digoxin  0.125 mg daily. -Ordered PO amio 200 mg BID for 10 days, then 200 mg daily.  -D/c dilt 60 mg 4x daily due to soft BP. Can resume for elevated HR and if BP remains stable -Beta blocker as stated below.  # Acute on chronic HFpEF Patient presents with worsening SOB for past 3  weeks. CXR with patchy bilateral airspace opacities. BNP elevated at 820.Echo from 11/2023 with pEF (60-65%), no RWMA. -Continue IV lasix  40 mg BID. Closely monitor UOP, electrolytes, renal function. -Continue Coreg  to 25 mg twice daily (Max dose is 25 mg BID). -Consider SGLT2i if renal function allows.   # Coronary artery disease # Hypertension # Hyperlipidemia Patient denies chest pain. Troponin minimally elevated and flat 19 > 20.  EKG in ED with atrial fibrillation RVR, rate 135 bpm. -Continue Plavix  75 mg daily, rosuvastatin  5 mg daily.  -Beta blocker as stated above. -Home ARB held due to borderline low BP and allow room for uptitration of diltiazem  for rate control.  -Minimally elevated and flat trops in setting of AF RVR and AOCHFpEF is most consistent with demand/supply mismatch and not ACS    This patient's plan of care was discussed and created with Dr. Braxton Calico and she is in agreement.  Signed: Feliz Lincoln, PA-C  02/01/2024, 7:15 AM [facility]

## 2024-02-01 NOTE — Progress Notes (Signed)
 Physical Therapy Treatment Patient Details Name: Mary [name] MRN: [id number] DOB: 1934/12/05 Today's Date: 02/01/2024   History of Present Illness Pt is an 89 y.o. female admitted for AFIB with RVR, hypokalemia, & hypomagnesemia. PMH significant for PAF on Eliquis , HTN, CAD, asthma/COPD, chronic HFpEF, HLD, RA, sleep apnea, spinal stenosis, recurrent nosebleed and lumbar DDD. Recently returned home from STR 5 days ago.    PT Comments  Patient is agreeable to PT session. Heart rate up to the 140's with standing activity. Sp02 88% on 2 L02 with ambulation. Cues for energy conservation and breathing techniques. Recommend to continue PT to maximize independence. Patient is hopeful for return home soon.    If plan is discharge home, recommend the following: A little help with walking and/or transfers;A little help with bathing/dressing/bathroom;Assistance with cooking/housework;Assist for transportation;Help with stairs or ramp for entrance   Can travel by private vehicle        Equipment Recommendations  None recommended by PT    Recommendations for Other Services       Precautions / Restrictions Precautions Precautions: Fall Recall of Precautions/Restrictions: Intact Restrictions Weight Bearing Restrictions Per Provider Order: No     Mobility  Bed Mobility Overal bed mobility: Modified Independent                  Transfers Overall transfer level: Needs assistance Equipment used: Rolling walker (2 wheels) Transfers: Sit to/from Stand Sit to Stand: Contact guard assist   Step pivot transfers: Contact guard assist       General transfer comment: cues for safety    Ambulation/Gait Ambulation/Gait assistance: Contact guard assist Gait Distance (Feet): 45 Feet Assistive device: Rolling walker (2 wheels) Gait Pattern/deviations: Step-through pattern Gait velocity: decreased     General Gait Details: activity tolerance is limited by fatigue. Heart rate up to  140's with standing. educated patient on energy conservation strategies and breathing techniques. Sp02 down to 88% on 2 L02   Stairs             Wheelchair Mobility     Tilt Bed    Modified Rankin (Stroke Patients Only)       Balance Overall balance assessment: Needs assistance Sitting-balance support: Single extremity supported, Feet supported Sitting balance-Leahy Scale: Good     Standing balance support: Bilateral upper extremity supported Standing balance-Leahy Scale: Fair                              Hotel manager: No apparent difficulties  Cognition Arousal: Alert Behavior During Therapy: WFL for tasks assessed/performed   PT - Cognitive impairments: No apparent impairments                         Following commands: Intact      Cueing Cueing Techniques: Verbal cues, Tactile cues  Exercises      General Comments General comments (skin integrity, edema, etc.): Sp02 99% on 2L 02 at rest. 88% on room air at rest. 88% on 2 L02 with ambulation      Pertinent Vitals/Pain Pain Assessment Pain Assessment: No/denies pain    Home Living                          Prior Function            PT Goals (current goals can now be found in the care plan  section) Acute Rehab PT Goals Patient Stated Goal: return home PT Goal Formulation: With patient Time For Goal Achievement: 02/13/24 Potential to Achieve Goals: Good Progress towards PT goals: Progressing toward goals    Frequency    Min 2X/week      PT Plan      Co-evaluation              AM-PAC PT 6 Clicks Mobility   Outcome Measure  Help needed turning from your back to your side while in a flat bed without using bedrails?: A Little Help needed moving from lying on your back to sitting on the side of a flat bed without using bedrails?: A Little Help needed moving to and from a bed to a chair (including a wheelchair)?: A  Little Help needed standing up from a chair using your arms (e.g., wheelchair or bedside chair)?: A Little Help needed to walk in hospital room?: A Little Help needed climbing 3-5 steps with a railing? : A Little 6 Click Score: 18    End of Session Equipment Utilized During Treatment: Gait belt Activity Tolerance: Patient tolerated treatment well Patient left: in chair;with call bell/phone within reach;with chair alarm set   PT Visit Diagnosis: Other abnormalities of gait and mobility (R26.89);Muscle weakness (generalized) (M62.81)     Time: 1610-9604 PT Time Calculation (min) (ACUTE ONLY): 24 min  Charges:    $Therapeutic Activity: 23-37 mins PT General Charges $$ ACUTE PT VISIT: 1 Visit                    Ozie Bo, PT, MPT    Erlene Hawks 02/01/2024, 10:03 AM

## 2024-02-01 NOTE — Plan of Care (Signed)

## 2024-02-01 NOTE — Progress Notes (Addendum)
 PROGRESS NOTE    Mary [name]  UEA:[id number] DOB: 11-10-1934 DOA: 01/29/2024 PCP: Monique Ano, MD   Assessment & Plan:   Principal Problem:   CHF (congestive heart failure) (HCC) Active Problems:   Hypothyroidism   Diabetes mellitus without complication (HCC)   Asthma   Coronary artery disease involving native coronary artery of native heart   Renal artery stenosis (HCC)   Hypertension, renovascular   A-fib (HCC)   Malnutrition of moderate degree  Assessment and Plan: A-fib: with RVR. Continue on coreg , digoxin , eliquis  & started on amio as per cardio. Continue on tele.Hypotension w/ dilt. Cardio following and recs apprec    Chronic diastolic CHF: continue on lasix . Monitor I/Os. No CP currently    Pulmonary infiltrates: likely pneumonia. Continue on IV rocephin , doxy x 5 days. Bronchodilators prn. Encourage incentive spirometry    Acute hypoxic respiratory failure: continue on supplemental oxygen and wean as tolerated. Will need oxy desat test prior to d/c    Hypokalemia: WNL today    History recurrent UTIs: urine cx growing multiple species. C/o dysuria. ? acute UTI. Continue on IV rocephin .    Hypothyroidism: continue on home dose of levothyroxine     DM2: fair control, HbA1c 7.0. Continue on SSI w/ accuchecks   HTN: continue on coreg    HLD: continue on statin   GAD: severity unknown. Continue on home dose of paroxetine    Debility: PT/OT recs HH       DVT prophylaxis: eliquis  Code Status: full  Family Communication: called pt's daughter, [name], but no answer so I left a voicemail  Disposition Plan: likely d/c back home   Level of care: Progressive  Status is: Inpatient Remains inpatient appropriate because: severity of illness    Consultants:  Cardio   Procedures:   Antimicrobials:    Subjective: Pt c/o fatigue   Objective: Vitals:   02/01/24 0430 02/01/24 0440 02/01/24 0529 02/01/24 0746  BP:    93/71  Pulse: 98 86  68  Resp: 18  20  20   Temp:    98 F (36.7 C)  TempSrc:    Oral  SpO2: 98% 98%  96%  Weight:   62 kg   Height:        Intake/Output Summary (Last 24 hours) at 02/01/2024 1004 Last data filed at 02/01/2024 0143 Gross per 24 hour  Intake 0 ml  Output 1100 ml  Net -1100 ml   Filed Weights   01/29/24 1703 01/31/24 0523 02/01/24 0529  Weight: 63.5 kg 63.8 kg 62 kg    Examination:  General exam: Appears calm and comfortable  Respiratory system: Clear to auscultation. Respiratory effort normal. Cardiovascular system: irregularly irregular. No rubs, gallops or clicks.  Gastrointestinal system: Abdomen is nondistended, soft and nontender. Normal bowel sounds heard. Central nervous system: Alert and awake. Moves all extremities  Psychiatry: Judgement and insight appears at baseline. Flat mood and affect    Data Reviewed: I have personally reviewed following labs and imaging studies  CBC: Recent Labs  Lab 01/29/24 1701 01/31/24 0400 02/01/24 0510  WBC 7.3 6.1 8.8  NEUTROABS 5.2  --   --   HGB 11.7* 12.0 11.6*  HCT 37.0 38.0 36.0  MCV 98.4 99.5 98.6  PLT 193 189 210   Basic Metabolic Panel: Recent Labs  Lab 01/29/24 1701 01/30/24 0536 01/31/24 0400 02/01/24 0510  NA 137 137 137 136  K 4.3 3.3* 3.7 4.0  CL 103 100 99 97*  CO2 26 30 28  30  GLUCOSE 191* 141* 124* 134*  BUN 14 12 14 23   CREATININE 0.55 0.60 0.79 0.66  CALCIUM  9.2 8.9 9.1 9.0  MG 1.5* 1.7 2.2 1.7   GFR: Estimated Creatinine Clearance: 45.4 mL/min (by C-G formula based on SCr of 0.66 mg/dL). Liver Function Tests: Recent Labs  Lab 01/29/24 1701  AST 33  ALT 8  ALKPHOS 49  BILITOT 1.4*  PROT 6.2*  ALBUMIN 2.6*   No results for input(s): LIPASE, AMYLASE in the last 168 hours. No results for input(s): AMMONIA in the last 168 hours. Coagulation Profile: No results for input(s): INR, PROTIME in the last 168 hours. Cardiac Enzymes: No results for input(s): CKTOTAL, CKMB, CKMBINDEX, TROPONINI  in the last 168 hours. BNP (last 3 results) No results for input(s): PROBNP in the last 8760 hours. HbA1C: Recent Labs    01/31/24 0400  HGBA1C 7.0*   CBG: Recent Labs  Lab 01/31/24 0749 01/31/24 1140 01/31/24 1650 01/31/24 2120 02/01/24 0748  GLUCAP 134* 173* 149* 120* 128*   Lipid Profile: No results for input(s): CHOL, HDL, LDLCALC, TRIG, CHOLHDL, LDLDIRECT in the last 72 hours. Thyroid Function Tests: Recent Labs    01/30/24 0536  TSH 4.510*   Anemia Panel: No results for input(s): VITAMINB12, FOLATE, FERRITIN, TIBC, IRON , RETICCTPCT in the last 72 hours. Sepsis Labs: Recent Labs  Lab 01/29/24 1924 01/29/24 2120 01/31/24 0400  PROCALCITON  --   --  <0.10  LATICACIDVEN 1.1 1.4  --     Recent Results (from the past 240 hours)  Urine Culture (for pregnant, neutropenic or urologic patients or patients with an indwelling urinary catheter)     Status: Abnormal   Collection Time: 01/29/24  5:01 PM   Specimen: Urine, Clean Catch  Result Value Ref Range Status   Specimen Description   Final    URINE, CLEAN CATCH Performed at [facility], [address], [place], [place] [address]    Special Requests   Final    NONE Performed at [facility], [address], [place], [place] [address]    Culture MULTIPLE SPECIES PRESENT, SUGGEST RECOLLECTION (A)  Final   Report Status 01/31/2024 FINAL  Final  Blood Culture (routine x 2)     Status: None (Preliminary result)   Collection Time: 01/29/24  7:24 PM   Specimen: BLOOD  Result Value Ref Range Status   Specimen Description BLOOD RIGHT ANTECUBITAL  Final   Special Requests   Final    BOTTLES DRAWN AEROBIC AND ANAEROBIC Blood Culture results may not be optimal due to an excessive volume of blood received in culture bottles   Culture   Final    NO GROWTH 3 DAYS Performed at [facility], [address] [place], [place] [address]    Report Status PENDING   Incomplete  Resp panel by RT-PCR (RSV, Flu A&B, Covid) Anterior Nasal Swab     Status: None   Collection Time: 01/29/24  7:35 PM   Specimen: Anterior Nasal Swab  Result Value Ref Range Status   SARS Coronavirus 2 by RT PCR NEGATIVE NEGATIVE Final    Comment: (NOTE) SARS-CoV-2 target nucleic acids are NOT DETECTED.  The SARS-CoV-2 RNA is generally detectable in upper respiratory specimens during the acute phase of infection. The lowest concentration of SARS-CoV-2 viral copies this assay can detect is 138 copies/mL. A negative result does not preclude SARS-Cov-2 infection and should not be used as the sole basis for treatment or other patient management decisions. A negative  result may occur with  improper specimen collection/handling, submission of specimen other than nasopharyngeal swab, presence of viral mutation(s) within the areas targeted by this assay, and inadequate number of viral copies(<138 copies/mL). A negative result must be combined with clinical observations, patient history, and epidemiological information. The expected result is Negative.  Fact Sheet for Patients:  BloggerCourse.com  Fact Sheet for Healthcare Providers:  SeriousBroker.it  This test is no t yet approved or cleared by the [place]  FDA and  has been authorized for detection and/or diagnosis of SARS-CoV-2 by FDA under an Emergency Use Authorization (EUA). This EUA will remain  in effect (meaning this test can be used) for the duration of the COVID-19 declaration under Section 564(b)(1) of the Act, 21 U.S.C.section 360bbb-3(b)(1), unless the authorization is terminated  or revoked sooner.       Influenza A by PCR NEGATIVE NEGATIVE Final   Influenza B by PCR NEGATIVE NEGATIVE Final    Comment: (NOTE) The Xpert Xpress SARS-CoV-2/FLU/RSV plus assay is intended as an aid in the diagnosis of influenza from Nasopharyngeal swab specimens and should  not be used as a sole basis for treatment. Nasal washings and aspirates are unacceptable for Xpert Xpress SARS-CoV-2/FLU/RSV testing.  Fact Sheet for Patients: BloggerCourse.com  Fact Sheet for Healthcare Providers: SeriousBroker.it  This test is not yet approved or cleared by the [place]  FDA and has been authorized for detection and/or diagnosis of SARS-CoV-2 by FDA under an Emergency Use Authorization (EUA). This EUA will remain in effect (meaning this test can be used) for the duration of the COVID-19 declaration under Section 564(b)(1) of the Act, 21 U.S.C. section 360bbb-3(b)(1), unless the authorization is terminated or revoked.     Resp Syncytial Virus by PCR NEGATIVE NEGATIVE Final    Comment: (NOTE) Fact Sheet for Patients: BloggerCourse.com  Fact Sheet for Healthcare Providers: SeriousBroker.it  This test is not yet approved or cleared by the [place]  FDA and has been authorized for detection and/or diagnosis of SARS-CoV-2 by FDA under an Emergency Use Authorization (EUA). This EUA will remain in effect (meaning this test can be used) for the duration of the COVID-19 declaration under Section 564(b)(1) of the Act, 21 U.S.C. section 360bbb-3(b)(1), unless the authorization is terminated or revoked.  Performed at [facility], [address] [place], [place] [address]   Blood Culture (routine x 2)     Status: None (Preliminary result)   Collection Time: 01/29/24  9:20 PM   Specimen: BLOOD  Result Value Ref Range Status   Specimen Description BLOOD RIGHT ANTECUBITAL  Final   Special Requests   Final    BOTTLES DRAWN AEROBIC AND ANAEROBIC Blood Culture results may not be optimal due to an excessive volume of blood received in culture bottles   Culture   Final    NO GROWTH 3 DAYS Performed at [facility], [address],  [place], [place] [address]    Report Status PENDING  Incomplete  Respiratory (~20 pathogens) panel by PCR     Status: None   Collection Time: 01/30/24  9:10 AM   Specimen: Nasopharyngeal Swab; Respiratory  Result Value Ref Range Status   Adenovirus NOT DETECTED NOT DETECTED Final   Coronavirus 229E NOT DETECTED NOT DETECTED Final    Comment: (NOTE) The Coronavirus on the Respiratory Panel, DOES NOT test for the novel  Coronavirus (2019 nCoV)    Coronavirus HKU1 NOT DETECTED NOT DETECTED Final   Coronavirus NL63 NOT DETECTED NOT DETECTED Final  Coronavirus OC43 NOT DETECTED NOT DETECTED Final   Metapneumovirus NOT DETECTED NOT DETECTED Final   Rhinovirus / Enterovirus NOT DETECTED NOT DETECTED Final   Influenza A NOT DETECTED NOT DETECTED Final   Influenza B NOT DETECTED NOT DETECTED Final   Parainfluenza Virus 1 NOT DETECTED NOT DETECTED Final   Parainfluenza Virus 2 NOT DETECTED NOT DETECTED Final   Parainfluenza Virus 3 NOT DETECTED NOT DETECTED Final   Parainfluenza Virus 4 NOT DETECTED NOT DETECTED Final   Respiratory Syncytial Virus NOT DETECTED NOT DETECTED Final   Bordetella pertussis NOT DETECTED NOT DETECTED Final   Bordetella Parapertussis NOT DETECTED NOT DETECTED Final   Chlamydophila pneumoniae NOT DETECTED NOT DETECTED Final   Mycoplasma pneumoniae NOT DETECTED NOT DETECTED Final    Comment: Performed at [facility], [address], [place], [place] [address]         Radiology Studies: No results found.      Scheduled Meds:  apixaban   5 mg Oral BID   busPIRone   10 mg Oral BID   carvedilol   25 mg Oral BID WC   clopidogrel   75 mg Oral Daily   digoxin   0.125 mg Oral Daily   doxycycline   100 mg Oral Q12H   feeding supplement  237 mL Oral BID BM   furosemide   40 mg Intravenous BID   insulin  aspart  0-5 Units Subcutaneous QHS   insulin  aspart  0-9 Units Subcutaneous TID WC   levothyroxine   75 mcg Oral Q0600   mirabegron  ER  25 mg Oral Daily    multivitamin with minerals  1 tablet Oral Daily   pantoprazole   20 mg Oral Daily   PARoxetine   40 mg Oral QPM   rosuvastatin   5 mg Oral Daily   sodium chloride  flush  3 mL Intravenous Q12H   Continuous Infusions:  cefTRIAXone  (ROCEPHIN )  IV 1 g (01/31/24 2130)     LOS: 3 days      Alphonsus Jeans, MD [facility] Pager [phone]  If 7PM-7AM, please contact night-coverage [email or web address] 02/01/2024, 10:04 AM

## 2024-02-01 NOTE — TOC Progression Note (Signed)
 Transition of Care [facility]) - Progression Note    Patient Details  Name: Mary [name] MRN: [id number] Date of Birth: 1935/03/07  [facility] of [facility]) CM/SW Contact  Baird Bombard, RN Phone Number: 02/01/2024, 4:08 PM  Clinical Narrative:    Spoke with patient at bedside. She stated she is active with another [facility] agency other than [name]. She request RNCM speak with her daughter.     4:14pm Attempt to reach patient's daughter. No answer. Unable to leave a message.         Expected Discharge Plan and Services                                               Social Determinants of Health (SDOH) Interventions SDOH Screenings   Food Insecurity: No Food Insecurity (01/30/2024)  Housing: Low Risk  (01/30/2024)  Transportation Needs: No Transportation Needs (01/30/2024)  Utilities: Not At Risk (01/30/2024)  Financial Resource Strain: Low Risk  (09/28/2023)   Received from [facility]  Social Connections: Moderately Isolated (01/30/2024)  Tobacco Use: Low Risk  (01/30/2024)    Readmission Risk Interventions     No data to display

## 2024-02-02 ENCOUNTER — Telehealth (HOSPITAL_COMMUNITY): Payer: Self-pay | Admitting: Pharmacy Technician

## 2024-02-02 ENCOUNTER — Other Ambulatory Visit (HOSPITAL_COMMUNITY): Payer: Self-pay

## 2024-02-02 DIAGNOSIS — I480 Paroxysmal atrial fibrillation: Secondary | ICD-10-CM | POA: Diagnosis not present

## 2024-02-02 LAB — CBC
HCT: 36.2 % (ref 36.0–46.0)
Hemoglobin: 11.1 g/dL — ABNORMAL LOW (ref 12.0–15.0)
MCH: 30.5 pg (ref 26.0–34.0)
MCHC: 30.7 g/dL (ref 30.0–36.0)
MCV: 99.5 fL (ref 80.0–100.0)
Platelets: 210 10*3/uL (ref 150–400)
RBC: 3.64 MIL/uL — ABNORMAL LOW (ref 3.87–5.11)
RDW: 14 % (ref 11.5–15.5)
WBC: 7.4 10*3/uL (ref 4.0–10.5)
nRBC: 0 % (ref 0.0–0.2)

## 2024-02-02 LAB — BASIC METABOLIC PANEL WITH GFR
Anion gap: 8 (ref 5–15)
BUN: 27 mg/dL — ABNORMAL HIGH (ref 8–23)
CO2: 33 mmol/L — ABNORMAL HIGH (ref 22–32)
Calcium: 9.3 mg/dL (ref 8.9–10.3)
Chloride: 98 mmol/L (ref 98–111)
Creatinine, Ser: 0.63 mg/dL (ref 0.44–1.00)
GFR, Estimated: >60 mL/min (ref >60)
Glucose, Bld: 136 mg/dL — ABNORMAL HIGH (ref 70–99)
Potassium: 3.6 mmol/L (ref 3.5–5.1)
Sodium: 139 mmol/L (ref 135–145)

## 2024-02-02 LAB — CULTURE, BLOOD (ROUTINE X 2)
Culture: NO GROWTH
Culture: NO GROWTH

## 2024-02-02 LAB — GLUCOSE, CAPILLARY
Glucose-Capillary: 142 mg/dL — ABNORMAL HIGH (ref 70–99)
Glucose-Capillary: 221 mg/dL — ABNORMAL HIGH (ref 70–99)
Glucose-Capillary: 101 mg/dL — ABNORMAL HIGH (ref 70–99)
Glucose-Capillary: 169 mg/dL — ABNORMAL HIGH (ref 70–99)

## 2024-02-02 LAB — MAGNESIUM: Magnesium: 1.7 mg/dL (ref 1.7–2.4)

## 2024-02-02 MED ORDER — MAGNESIUM SULFATE 2 GM/50ML IV SOLN
2.0000 g | Freq: Once | INTRAVENOUS | Status: AC
  Administered 2024-02-02: 2 g via INTRAVENOUS
  Filled 2024-02-02: qty 50

## 2024-02-02 MED ORDER — POTASSIUM CHLORIDE CRYS ER 20 MEQ PO TBCR
40.0000 meq | EXTENDED_RELEASE_TABLET | Freq: Once | ORAL | Status: AC
  Administered 2024-02-02: 40 meq via ORAL
  Filled 2024-02-02: qty 2

## 2024-02-02 MED ORDER — METOPROLOL SUCCINATE ER 100 MG PO TB24
100.0000 mg | ORAL_TABLET | Freq: Every day | ORAL | Status: DC
  Administered 2024-02-03 – 2024-02-07 (×5): 100 mg via ORAL
  Filled 2024-02-02 (×5): qty 1

## 2024-02-02 MED ORDER — CARVEDILOL 25 MG PO TABS
25.0000 mg | ORAL_TABLET | Freq: Two times a day (BID) | ORAL | Status: AC
  Administered 2024-02-02: 25 mg via ORAL
  Filled 2024-02-02: qty 1

## 2024-02-02 NOTE — Plan of Care (Signed)

## 2024-02-02 NOTE — Plan of Care (Signed)
   Problem: Education: Goal: Knowledge of General Education information will improve Description Including pain rating scale, medication(s)/side effects and non-pharmacologic comfort measures Outcome: Progressing

## 2024-02-02 NOTE — Plan of Care (Signed)
 On room air at rest O2 sat: 91% On room air while ambulating O2 sat: 86% On 2L/min nasal canula while ambulating O2 sat: 95%

## 2024-02-02 NOTE — Progress Notes (Signed)
 Physical Therapy Treatment Patient Details Name: Mary [name] MRN: [id number] DOB: 1934/09/11 Today's Date: 02/02/2024   History of Present Illness Pt is an 89 y.o. female admitted for AFIB with RVR, hypokalemia, & hypomagnesemia. PMH significant for PAF on Eliquis , HTN, CAD, asthma/COPD, chronic HFpEF, HLD, RA, sleep apnea, spinal stenosis, recurrent nosebleed and lumbar DDD. Recently returned home from STR 5 days ago.    PT Comments  Patient received in recliner. She is pleasant and agrees to PT session. Patient is on 2 liters O2. Ambulated 30 feet x 2 reps with seated rest between. O2 sats dropping to 85% during mobility. HR elevated to 120s. Patient steady with ambulating in room and multiple turns. She will continue to benefit from skilled PT to improve independence, strength and endurance.     If plan is discharge home, recommend the following: A little help with walking and/or transfers;A little help with bathing/dressing/bathroom;Assist for transportation;Help with stairs or ramp for entrance   Can travel by private vehicle      yes  Equipment Recommendations  None recommended by PT    Recommendations for Other Services       Precautions / Restrictions Precautions Precautions: Fall Recall of Precautions/Restrictions: Intact Restrictions Weight Bearing Restrictions Per Provider Order: No     Mobility  Bed Mobility               General bed mobility comments: NT -in chair before and after    Transfers Overall transfer level: Modified independent Equipment used: Rolling walker (2 wheels) Transfers: Sit to/from Stand Sit to Stand: Supervision           General transfer comment: cues for safety    Ambulation/Gait Ambulation/Gait assistance: Supervision Gait Distance (Feet): 60 Feet Assistive device: Rolling walker (2 wheels) Gait Pattern/deviations: Step-through pattern Gait velocity: decr     General Gait Details: patient ambulating well with RW, O2  sats dropping to mid 80%s on 2 liters and HR variable. Seated rest after 30 feet.   Stairs             Wheelchair Mobility     Tilt Bed    Modified Rankin (Stroke Patients Only)       Balance Overall balance assessment: Independent Sitting-balance support: Feet supported Sitting balance-Leahy Scale: Normal     Standing balance support: Bilateral upper extremity supported, During functional activity, Reliant on assistive device for balance Standing balance-Leahy Scale: Good                              Communication Communication Communication: No apparent difficulties  Cognition Arousal: Alert Behavior During Therapy: WFL for tasks assessed/performed   PT - Cognitive impairments: No apparent impairments                         Following commands: Intact      Cueing Cueing Techniques: Verbal cues, Tactile cues  Exercises      General Comments        Pertinent Vitals/Pain Pain Assessment Pain Assessment: No/denies pain    Home Living                          Prior Function            PT Goals (current goals can now be found in the care plan section) Acute Rehab PT Goals Patient Stated Goal: return home  PT Goal Formulation: With patient Time For Goal Achievement: 02/13/24 Potential to Achieve Goals: Good Progress towards PT goals: Progressing toward goals    Frequency    Min 2X/week      PT Plan      Co-evaluation              AM-PAC PT 6 Clicks Mobility   Outcome Measure  Help needed turning from your back to your side while in a flat bed without using bedrails?: A Little Help needed moving from lying on your back to sitting on the side of a flat bed without using bedrails?: A Little Help needed moving to and from a bed to a chair (including a wheelchair)?: A Little Help needed standing up from a chair using your arms (e.g., wheelchair or bedside chair)?: A Little Help needed to walk in  hospital room?: A Little Help needed climbing 3-5 steps with a railing? : A Little 6 Click Score: 18    End of Session Equipment Utilized During Treatment: Oxygen Activity Tolerance: Patient tolerated treatment well Patient left: in chair;with call bell/phone within reach Nurse Communication: Mobility status PT Visit Diagnosis: Muscle weakness (generalized) (M62.81)     Time: 1610-9604 PT Time Calculation (min) (ACUTE ONLY): 24 min  Charges:    $Gait Training: 23-37 mins PT General Charges $$ ACUTE PT VISIT: 1 Visit                     Adyline Huberty, PT, GCS 02/02/24,3:12 PM

## 2024-02-02 NOTE — Telephone Encounter (Signed)
 Patient Product/process development scientist completed.    The patient is insured through [employer]. Patient has Medicare and is not eligible for a copay card, but may be able to apply for patient assistance or Medicare RX Payment Plan (Patient Must reach out to their plan, if eligible for payment plan), if available.    Ran test claim for Farxiga 10 mg and the current 30 day co-pay is $64.00.   This test claim was processed through [facility]- copay amounts may vary at other pharmacies due to pharmacy/plan contracts, or as the patient moves through the different stages of their insurance plan.     Morgan Arab, CPHT Pharmacy Technician III Certified Patient Advocate [facility] Patient Advocate Team Direct Number: [phone]  Fax: [phone]

## 2024-02-02 NOTE — Progress Notes (Signed)
 [facility] CARDIOLOGY PROGRESS NOTE       [name] MRN: [id number] DOB/AGE: 87-21-1936 87 y.o.  Admit date: 01/29/2024 Referring Physician Dr. Hines Ludwig Primary Physician Monique Ano, MD Primary Cardiologist Dr. Larinda Plover Reason for Consultation AF RVR  HPI: Mary [name] is a 89 y.o. female  with a past medical history of paroxsymal atrial fibrillation (on Eliquis ), chronic HFpEF, coronary artery disease (on CT), hypertension, hyperlipidemia, T2DM, right carotid stenosis s/p CEA, pulmonary hypertension, COPD/asthma who presented to the ED on 01/29/2024 for shortness of breath, palpitations and lower abdominal pain. EKG in ED revealed AF RVR with rates 160s. Cardiology was consulted for further evaluation.   Interval History: -Patient seen and examined this AM and sitting up and eating breakfast. Patient states she feels much better and states SOB continues to improve. Denies chest pain or palpitations.   -Patients BP improving and HR elevated today -Per tele remains in AF rates 90s-100s.  -UOP yesterday 1.6L with stable renal function.   -Patient remains on 2L [place] with stable SpO2.    Review of systems complete and found to be negative unless listed above    Past Medical History:  Diagnosis Date   Acid reflux    Anemia    Arthritis    Asthma    Atherosclerosis of abdominal aorta (HCC)    B12 deficiency    Clostridium difficile infection    H/O   Coronary artery disease    DDD (degenerative disc disease), lumbar    Depression    Diabetes (HCC)    type 2   Diabetic retinopathy (HCC)    Dysrhythmia    Heart murmur    HLD (hyperlipidemia)    HTN (hypertension)    Hypotension    when get up too quickly   Hypothyroidism    Pneumonia    PONV (postoperative nausea and vomiting)    Pulmonary nodule    right middle lobe on CT scan   Pure hypercholesterolemia    RA (rheumatoid arthritis) (HCC)    Sleep apnea    Spinal stenosis    Urinary urgency      Past Surgical History:  Procedure Laterality Date   ABDOMINAL HYSTERECTOMY     CATARACT EXTRACTION, BILATERAL     COLONOSCOPY N/A 07/29/2021   Procedure: COLONOSCOPY;  Surgeon: Toledo, Alphonsus Jeans, MD;  Location: [facility];  Service: Gastroenterology;  Laterality: N/A;   CYSTOSCOPY     ENDARTERECTOMY Right 12/02/2021   Procedure: ENDARTERECTOMY CAROTID;  Surgeon: Celso College, MD;  Location: [facility] ORS;  Service: Vascular;  Laterality: Right;   ESOPHAGOGASTRODUODENOSCOPY N/A 07/29/2021   Procedure: ESOPHAGOGASTRODUODENOSCOPY (EGD);  Surgeon: Toledo, Alphonsus Jeans, MD;  Location: [facility] END[facility];  Service: Gastroenterology;  Laterality: N/A;  DM   HIP ARTHROPLASTY Right 09/24/2015   Procedure: ARTHROPLASTY BIPOLAR HIP (HEMIARTHROPLASTY);  Surgeon: Elner Hahn, MD;  Location: [facility] ORS;  Service: Orthopedics;  Laterality: Right;   HIP ARTHROPLASTY Left 09/29/2018   Procedure: ARTHROPLASTY BIPOLAR HIP (HEMIARTHROPLASTY) LEFT;  Surgeon: Elner Hahn, MD;  Location: [facility] ORS;  Service: Orthopedics;  Laterality: Left;   KNEE ARTHROSCOPY W/ AUTOGENOUS CARTILAGE IMPLANTATION (ACI) PROCEDURE     TOTAL KNEE ARTHROPLASTY Left 12/01/2017   Procedure: TOTAL KNEE ARTHROPLASTY;  Surgeon: Elner Hahn, MD;  Location: [facility] ORS;  Service: Orthopedics;  Laterality: Left;   TOTAL KNEE ARTHROPLASTY Right 2014   VARICOSE VEIN SURGERY Left    leg    Medications Prior to Admission  Medication Sig  Dispense Refill Last Dose/Taking   acetaminophen  (TYLENOL ) 500 MG tablet Take 500 mg by mouth every 6 (six) hours as needed for mild pain (pain score 1-3).   Taking As Needed   azelastine (OPTIVAR) 0.05 % ophthalmic solution Place 1 drop into both eyes daily as needed (allergies).   Taking As Needed   busPIRone  (BUSPAR ) 10 MG tablet Take 10 mg by mouth 2 (two) times daily.   01/29/2024   carvedilol  (COREG ) 6.25 MG tablet Take 6.25 mg by mouth 2 (two) times daily with a meal.   01/29/2024   cyclobenzaprine  (FLEXERIL ) 5 MG  tablet Take 1 tablet (5 mg total) by mouth 3 (three) times daily as needed for muscle spasms.   Taking As Needed   furosemide  (LASIX ) 40 MG tablet Take 1 tablet (40 mg total) by mouth daily.   Past Week   Iron -Vitamin C  (VITRON-C) 65-125 MG TABS Take 1 tablet by mouth daily. 90 tablet 1 Past Week   levothyroxine  (SYNTHROID , LEVOTHROID) 75 MCG tablet Take 75 mcg by mouth daily.    01/29/2024   losartan  (COZAAR ) 25 MG tablet Take 1 tablet by mouth daily.   Taking   metFORMIN  (GLUCOPHAGE -XR) 500 MG 24 hr tablet Take 500 mg by mouth daily. With dinner   01/29/2024   pantoprazole  (PROTONIX ) 20 MG tablet Take 20 mg by mouth daily.   01/29/2024   PARoxetine  (PAXIL ) 40 MG tablet Take 40 mg by mouth every evening.   01/29/2024   potassium chloride  SA (KLOR-CON  M) 20 MEQ tablet Take 20 mEq by mouth 2 (two) times daily.  Take 1 tablet (20 mEq total) by mouth 2 (two) times daily With lasix    01/29/2024   rosuvastatin  (CRESTOR ) 5 MG tablet Take 1 tablet (5 mg total) by mouth daily. 30 tablet 0 Past Week   vitamin B-12 (CYANOCOBALAMIN ) 500 MCG tablet Take 500 mcg by mouth every other day.   01/29/2024   apixaban  (ELIQUIS ) 5 MG TABS tablet Take 1 tablet (5 mg total) by mouth 2 (two) times daily. (Patient not taking: Reported on 01/30/2024)   Not Taking   carvedilol  (COREG ) 25 MG tablet Take 2 tablets (50 mg total) by mouth 2 (two) times daily with a meal. (Patient not taking: Reported on 01/30/2024)   Not Taking   carvedilol  (COREG ) 25 MG tablet Take 25 mg by mouth 2 (two) times daily with a meal. (Patient not taking: Reported on 01/30/2024)   Not Taking   clopidogrel  (PLAVIX ) 75 MG tablet Take 75 mg by mouth daily. (Patient not taking: Reported on 01/30/2024)   Not Taking   digoxin  (LANOXIN ) 0.125 MG tablet Take 1 tablet (0.125 mg total) by mouth daily. (Patient not taking: Reported on 01/30/2024)   Not Taking   irbesartan  (AVAPRO ) 150 MG tablet Take 1 tablet (150 mg total) by mouth daily. (Patient not taking: Reported on 01/30/2024)    Not Taking   traZODone  (DESYREL ) 50 MG tablet Take 0.5 tablets (25 mg total) by mouth at bedtime as needed for sleep. (Patient not taking: Reported on 01/30/2024)   Not Taking   Vibegron  (GEMTESA ) 75 MG TABS Take 1 tablet (75 mg total) by mouth daily at 6 (six) AM. (Patient not taking: Reported on 01/30/2024)   Not Taking   Social History   Socioeconomic History   Marital status: Widowed    Spouse name: Not on file   Number of children: Not on file   Years of education: Not on file   Highest education level:  Not on file  Occupational History   Occupation: retired  Tobacco Use   Smoking status: Never   Smokeless tobacco: Never  Vaping Use   Vaping status: Never Used  Substance and Sexual Activity   Alcohol  use: No    Alcohol /week: 0.0 standard drinks of alcohol    Drug use: No   Sexual activity: Not Currently  Other Topics Concern   Not on file  Social History Narrative   Lives with daughter   Social Drivers of Health   Financial Resource Strain: Low Risk  (09/28/2023)   Received from [facility]   Overall Financial Resource Strain (CARDIA)    Difficulty of Paying Living Expenses: Not hard at all  Food Insecurity: No Food Insecurity (01/30/2024)   Hunger Vital Sign    Worried About Running Out of Food in the Last Year: Never true    Ran Out of Food in the Last Year: Never true  Transportation Needs: No Transportation Needs (01/30/2024)   PRAPARE - Administrator, Civil Service (Medical): No    Lack of Transportation (Non-Medical): No  Physical Activity: Not on file  Stress: Not on file  Social Connections: Moderately Isolated (01/30/2024)   Social Connection and Isolation Panel    Frequency of Communication with Friends and Family: More than three times a week    Frequency of Social Gatherings with Friends and Family: More than three times a week    Attends Religious Services: 1 to 4 times per year    Active Member of [employer] or Organizations: No     Attends Banker Meetings: Never    Marital Status: Widowed  Intimate Partner Violence: Not At Risk (01/30/2024)   Humiliation, Afraid, Rape, and Kick questionnaire    Fear of Current or Ex-Partner: No    Emotionally Abused: No    Physically Abused: No    Sexually Abused: No    Family History  Problem Relation Age of Onset   Kidney disease Brother        also nephew   Heart disease Mother    Heart disease Father    Prostate cancer Neg Hx    Bladder Cancer Neg Hx    Breast cancer Neg Hx    Kidney cancer Neg Hx      Vitals:   02/02/24 0300 02/02/24 0500 02/02/24 0753 02/02/24 1148  BP: 90/65  134/73   Pulse:      Resp:      Temp: 97.8 F (36.6 C)  98 F (36.7 C) 98 F (36.7 C)  TempSrc: Axillary  Oral Oral  SpO2:      Weight:  62.7 kg    Height:        PHYSICAL EXAM General: well appearing elderly female, well nourished, in no acute distress. HEENT: Normocephalic and atraumatic. Neck: No JVD.   Lungs: Normal respiratory effort on 2L [place]. Diminished breath sounds bilaterally.  Heart: Irregularly, irregular, rate controlled. Normal S1 and S2 without gallops or murmurs.  Abdomen: Non-distended appearing.  Msk: Normal strength and tone for age. Extremities: Warm and well perfused. No clubbing, cyanosis. No edema.  Neuro: Alert and oriented X 3. Psych: Answers questions appropriately.   Labs: Basic Metabolic Panel: Recent Labs    02/01/24 0510 02/02/24 0550  NA 136 139  K 4.0 3.6  CL 97* 98  CO2 30 33*  GLUCOSE 134* 136*  BUN 23 27*  CREATININE 0.66 0.63  CALCIUM  9.0 9.3  MG 1.7 1.7  Liver Function Tests: No results for input(s): AST, ALT, ALKPHOS, BILITOT, PROT, ALBUMIN in the last 72 hours.  No results for input(s): LIPASE, AMYLASE in the last 72 hours. CBC: Recent Labs    02/01/24 0510 02/02/24 0550  WBC 8.8 7.4  HGB 11.6* 11.1*  HCT 36.0 36.2  MCV 98.6 99.5  PLT 210 210   Cardiac Enzymes: No results for input(s):  CKTOTAL, CKMB, CKMBINDEX, TROPONINIHS in the last 72 hours.  BNP: No results for input(s): BNP in the last 72 hours.  D-Dimer: No results for input(s): DDIMER in the last 72 hours. Hemoglobin A1C: Recent Labs    01/31/24 0400  HGBA1C 7.0*   Fasting Lipid Panel: No results for input(s): CHOL, HDL, LDLCALC, TRIG, CHOLHDL, LDLDIRECT in the last 72 hours. Thyroid Function Tests: No results for input(s): TSH, T4TOTAL, T3FREE, THYROIDAB in the last 72 hours.  Invalid input(s): FREET3  Anemia Panel: No results for input(s): VITAMINB12, FOLATE, FERRITIN, TIBC, IRON , RETICCTPCT in the last 72 hours.   Radiology: [facility] Chest Port 1 View Result Date: 01/29/2024 CLINICAL DATA:  Shortness of breath EXAM: PORTABLE CHEST 1 VIEW COMPARISON:  01/08/2024 FINDINGS: Heart and mediastinal contours within normal limits. Patchy bilateral airspace opacities, right greater than left. No visible significant effusions. No acute bony abnormality. IMPRESSION: Patchy bilateral airspace opacities concerning for infection. Electronically Signed   By: Janeece Mechanic M.D.   On: 01/29/2024 17:31   DG Chest 1 View Result Date: 01/08/2024 CLINICAL DATA:  Congestive heart failure. EXAM: CHEST  1 VIEW COMPARISON:  01/07/2024 FINDINGS: Low volume film. The cardio pericardial silhouette is enlarged. There is pulmonary vascular congestion without overt pulmonary edema. Trace blunting of the left costophrenic angle raises the question of tiny effusion. No acute bony abnormality. Telemetry leads overlie the chest. IMPRESSION: Low volume film with pulmonary vascular congestion. Possible tiny left pleural effusion. Electronically Signed   By: Donnal Fusi M.D.   On: 01/08/2024 07:22   DG Cervical Spine 2 or 3 views Result Date: 01/07/2024 CLINICAL DATA:  16109 Neck pain [id number] EXAM: CERVICAL SPINE - 2-3 VIEW COMPARISON:  December 07, 2023. FINDINGS: The cervical spine is visualized from C1-C7.  Straightening of the cervical lordosis without significant spondylolisthesis. Vertebral body heights are mantle maintained: No evidence of acute fracture. Moderate multilevel intervertebral disc space height loss throughout the cervical spine most pronounced at C4-5, C5-6 and C6-7. Multilevel facet arthropathy and uncovertebral hypertrophy. Limited assessment of the atlantooccipital interval due to overlapping tissue. No prevertebral soft tissue swelling. Prominent contour of the LEFT superior hilum. IMPRESSION: 1. Moderate multilevel degenerative changes of the cervical spine. 2. There is a prominent contour of the LEFT superior hilum. This could reflect a similar prominent pulmonary artery, the lymphadenopathy or space-occupying lesion remains in the differential. Consider further evaluation with dedicated chest CT if clinically indicated. Electronically Signed   By: Clancy Crimes M.D.   On: 01/07/2024 16:44   DG Chest Portable 1 View Result Date: 01/07/2024 CLINICAL DATA:  CP EXAM: PORTABLE CHEST 1 VIEW COMPARISON:  December 09, 2023 FINDINGS: The cardiomediastinal silhouette is unchanged in contour.Atherosclerotic calcifications. Trace LEFT pleural effusion. No pneumothorax. No acute pleuroparenchymal abnormality. IMPRESSION: Trace LEFT pleural effusion. Electronically Signed   By: Clancy Crimes M.D.   On: 01/07/2024 14:49    ECHO 12/08/2023  1. Left ventricular ejection fraction, by estimation, is 60 to 65%. The left ventricle has normal function. The left ventricle has no regional wall motion abnormalities. Left ventricular diastolic parameters are indeterminate.  2. Right ventricular systolic function is normal. The right ventricular size is normal.   3. Left atrial size was mildly dilated.   4. Large pleural effusion.   5. The mitral valve is normal in structure. Trivial mitral valve regurgitation. No evidence of mitral stenosis.   6. The aortic valve is normal in structure. Aortic valve  regurgitation is not visualized. No aortic stenosis is present.   7. The inferior vena cava is normal in size with greater than 50% respiratory variability, suggesting right atrial pressure of 3 mmHg.   TELEMETRY reviewed by me 02/02/2024: atrial fibrillation, rate 90s-100s  EKG reviewed by me: atrial fibrillation RVR, rate 135 bpm  Data reviewed by me 02/02/2024: last 24h vitals tele labs imaging I/O hospitalist progress note  Principal Problem:   CHF (congestive heart failure) (HCC) Active Problems:   Diabetes mellitus without complication (HCC)   Hypothyroidism   Asthma   Coronary artery disease involving native coronary artery of native heart   Renal artery stenosis (HCC)   Hypertension, renovascular   A-fib (HCC)   Malnutrition of moderate degree    ASSESSMENT AND PLAN:   Mary [name] is a 89 y.o. female  with a past medical history of paroxsymal atrial fibrillation (on Eliquis ), chronic HFpEF, coronary artery disease (on CT), hypertension, hyperlipidemia, T2DM, right carotid stenosis s/p CEA, pulmonary hypertension, COPD/asthma who presented to the ED on 01/29/2024 for shortness of breath, palpitations and lower abdominal pain. EKG in ED revealed AF RVR with rates 160s. Patient recently hospitalized on 04/16 with AF RVR and AoCHFpEF with similar presentation.  Cardiology was consulted for further evaluation.   # Paroxymal atrial fibrillation  Patient reports palpitations, SOB for past 3 weeks and presents on admission with AF RVR in 160s. IV Caredizem gtt started in ED. Per tele patient remains in AF with rates 90-100s -Monitor and replenish electrolytes for a goal K >4, Mag >2  -Continue Eliquis  5 mg twice daily for stroke risk reduction.  -Continue PO digoxin  0.125 mg daily. -Ordered PO amio 200 mg BID for 10 days, then 200 mg daily.  -D/c dilt 60 mg 4x daily due to soft BP. Can resume for elevated HR and if BP remains stable -D/c Coreg  after afternoon dose. Ordered metoprolol   succinate 100 mg daily to start tomorrow.   # Acute on chronic HFpEF Patient presents with worsening SOB for past 3 weeks. CXR with patchy bilateral airspace opacities. BNP elevated at 820.Echo from 11/2023 with pEF (60-65%), no RWMA. -Continue IV lasix  40 mg BID. Closely monitor UOP, electrolytes, renal function. -Beta blocker as stated above. -Consider SGLT2i if renal function allows. (Per pharmacy copay [phone]).    # Coronary artery disease # Hypertension # Hyperlipidemia Patient denies chest pain. Troponin minimally elevated and flat 19 > 20.  EKG in ED with atrial fibrillation RVR, rate 135 bpm. -Continue Plavix  75 mg daily, rosuvastatin  5 mg daily.  -Beta blocker as stated above. -Home ARB held due to borderline low BP and allow room for uptitration of diltiazem  for rate control.  -Minimally elevated and flat trops in setting of AF RVR and AOCHFpEF is most consistent with demand/supply mismatch and not ACS    This patient's plan of care was discussed and created with Dr. Braxton Calico and she is in agreement.  Signed: Creighton Doffing, PA-C  02/02/2024, 12:45 PM [facility]

## 2024-02-02 NOTE — Plan of Care (Signed)

## 2024-02-02 NOTE — TOC Progression Note (Signed)
 Transition of Care [facility]) - Progression Note    Patient Details  Name: SEFERINA BROKAW MRN: [id number] Date of Birth: 08/14/1935  [facility] CM/SW Contact  Baird Bombard, RN Phone Number: 02/02/2024, 12:39 PM  Clinical Narrative:    Spoke with patient's daughter, [name]. Patient is active with [facility]. [name] was advised [facility] will contact her at time of discharge to schedule her resumption of care.   Shaun from [facility] notified of patient's admission.         Expected Discharge Plan and Services                                               Social Determinants of Health (SDOH) Interventions SDOH Screenings   Food Insecurity: No Food Insecurity (01/30/2024)  Housing: Low Risk  (01/30/2024)  Transportation Needs: No Transportation Needs (01/30/2024)  Utilities: Not At Risk (01/30/2024)  Financial Resource Strain: Low Risk  (09/28/2023)   Received from [facility]  Social Connections: Moderately Isolated (01/30/2024)  Tobacco Use: Low Risk  (01/30/2024)    Readmission Risk Interventions     No data to display

## 2024-02-02 NOTE — Progress Notes (Addendum)
 PROGRESS NOTE    Mary [name]  VHQ:[id number] DOB: [birth year] DOA: 01/29/2024 PCP: Monique Ano, MD   Assessment & Plan:   Principal Problem:   CHF (congestive heart failure) (HCC) Active Problems:   Hypothyroidism   Diabetes mellitus without complication (HCC)   Asthma   Coronary artery disease involving native coronary artery of native heart   Renal artery stenosis (HCC)   Hypertension, renovascular   A-fib (HCC)   Malnutrition of moderate degree  Assessment and Plan: A-fib: with RVR. Continue on digoxin , amio & eliquis . D/c coreg  and start metoprolol  tomorrow as per cardio. Continue on tele. Cardio following and recs apprec   Chronic diastolic CHF: continue on lasix . Monitor I/Os. No CP currently    Pulmonary infiltrates: likely pneumonia. Will complete abx course after today's doses. Bronchodilators prn. Encourage incentive spirometry    Acute hypoxic respiratory failure: continue on supplemental oxygen and wean as tolerated. Unable to wean from supplemental oxygen so far and will possibly have to d/c home with oxygen    Hypokalemia: WNL today    History recurrent UTIs: urine cx growing multiple species. C/o dysuria. ? acute UTI. Continue on IV rocephin .    Hypothyroidism: continue on home dose of levothyroxine      DM2: fair control, HbA1c 7.0. Continue on SSI w/ accuchecks    HTN: continue on coreg     HLD: continue on statin   GAD: severity unknown. Continue on home dose of paroxetine    Debility: PT/OT recs HH       DVT prophylaxis: eliquis  Code Status: full  Family Communication: discussed pt's care w/ pt's daughter, [name], and answered her questions Disposition Plan: likely d/c back home   Level of care: Progressive  Status is: Inpatient Remains inpatient appropriate because: severity of illness    Consultants:  Cardio   Procedures:   Antimicrobials:    Subjective: Pt c/o malaise   Objective: Vitals:   02/01/24 2314 02/02/24 0300  02/02/24 0500 02/02/24 0753  BP: 96/70 90/65  134/73  Pulse:      Resp:      Temp: 97.7 F (36.5 C) 97.8 F (36.6 C)  98 F (36.7 C)  TempSrc: Oral Axillary  Oral  SpO2:      Weight:   62.7 kg   Height:        Intake/Output Summary (Last 24 hours) at 02/02/2024 0856 Last data filed at 02/02/2024 0610 Gross per 24 hour  Intake 390 ml  Output 1600 ml  Net -1210 ml   Filed Weights   01/31/24 0523 02/01/24 0529 02/02/24 0500  Weight: 63.8 kg 62 kg 62.7 kg    Examination:  General exam: Appears comfortable  Respiratory system: decreased breath sounds b/l  Cardiovascular system: irregularly irregular  Gastrointestinal system: abd is soft, NT, ND & hypoactive bowel sounds Central nervous system: alert & awake. Moves all extremities  Psychiatry: Judgement and insight appears at baseline. Flat mood and affect    Data Reviewed: I have personally reviewed following labs and imaging studies  CBC: Recent Labs  Lab 01/29/24 1701 01/31/24 0400 02/01/24 0510 02/02/24 0550  WBC 7.3 6.1 8.8 7.4  NEUTROABS 5.2  --   --   --   HGB 11.7* 12.0 11.6* 11.1*  HCT 37.0 38.0 36.0 36.2  MCV 98.4 99.5 98.6 99.5  PLT 193 189 210 210   Basic Metabolic Panel: Recent Labs  Lab 01/29/24 1701 01/30/24 0536 01/31/24 0400 02/01/24 0510 02/02/24 0550  NA 137 137 137 136 139  K 4.3 3.3* 3.7 4.0 3.6  CL 103 100 99 97* 98  CO2 26 30 28 30  33*  GLUCOSE 191* 141* 124* 134* 136*  BUN 14 12 14 23  27*  CREATININE 0.55 0.60 0.79 0.66 0.63  CALCIUM  9.2 8.9 9.1 9.0 9.3  MG 1.5* 1.7 2.2 1.7 1.7   GFR: Estimated Creatinine Clearance: 45.4 mL/min (by C-G formula based on SCr of 0.63 mg/dL). Liver Function Tests: Recent Labs  Lab 01/29/24 1701  AST 33  ALT 8  ALKPHOS 49  BILITOT 1.4*  PROT 6.2*  ALBUMIN 2.6*   No results for input(s): LIPASE, AMYLASE in the last 168 hours. No results for input(s): AMMONIA in the last 168 hours. Coagulation Profile: No results for input(s):  INR, PROTIME in the last 168 hours. Cardiac Enzymes: No results for input(s): CKTOTAL, CKMB, CKMBINDEX, TROPONINI in the last 168 hours. BNP (last 3 results) No results for input(s): PROBNP in the last 8760 hours. HbA1C: Recent Labs    01/31/24 0400  HGBA1C 7.0*   CBG: Recent Labs  Lab 02/01/24 0748 02/01/24 1142 02/01/24 1726 02/01/24 2137 02/02/24 0751  GLUCAP 128* 184* 144* 178* 142*   Lipid Profile: No results for input(s): CHOL, HDL, LDLCALC, TRIG, CHOLHDL, LDLDIRECT in the last 72 hours. Thyroid Function Tests: No results for input(s): TSH, T4TOTAL, FREET4, T3FREE, THYROIDAB in the last 72 hours.  Anemia Panel: No results for input(s): VITAMINB12, FOLATE, FERRITIN, TIBC, IRON , RETICCTPCT in the last 72 hours. Sepsis Labs: Recent Labs  Lab 01/29/24 1924 01/29/24 2120 01/31/24 0400  PROCALCITON  --   --  <0.10  LATICACIDVEN 1.1 1.4  --     Recent Results (from the past 240 hours)  Urine Culture (for pregnant, neutropenic or urologic patients or patients with an indwelling urinary catheter)     Status: Abnormal   Collection Time: 01/29/24  5:01 PM   Specimen: Urine, Clean Catch  Result Value Ref Range Status   Specimen Description   Final    URINE, CLEAN CATCH Performed at [facility], [address] [place], [place] [address]    Special Requests   Final    NONE Performed at [facility], [address] [place], [place] [address]    Culture MULTIPLE SPECIES PRESENT, SUGGEST RECOLLECTION (A)  Final   Report Status 01/31/2024 FINAL  Final  Blood Culture (routine x 2)     Status: None (Preliminary result)   Collection Time: 01/29/24  7:24 PM   Specimen: BLOOD  Result Value Ref Range Status   Specimen Description BLOOD RIGHT ANTECUBITAL  Final   Special Requests   Final    BOTTLES DRAWN AEROBIC AND ANAEROBIC Blood Culture results may not be optimal due to an excessive volume of blood  received in culture bottles   Culture   Final    NO GROWTH 4 DAYS Performed at [facility], [address] [place], [place] [address]    Report Status PENDING  Incomplete  Resp panel by RT-PCR (RSV, Flu A&B, Covid) Anterior Nasal Swab     Status: None   Collection Time: 01/29/24  7:35 PM   Specimen: Anterior Nasal Swab  Result Value Ref Range Status   SARS Coronavirus 2 by RT PCR NEGATIVE NEGATIVE Final    Comment: (NOTE) SARS-CoV-2 target nucleic acids are NOT DETECTED.  The SARS-CoV-2 RNA is generally detectable in upper respiratory specimens during the acute phase of infection. The lowest concentration of SARS-CoV-2 viral copies this assay can detect  is 138 copies/mL. A negative result does not preclude SARS-Cov-2 infection and should not be used as the sole basis for treatment or other patient management decisions. A negative result may occur with  improper specimen collection/handling, submission of specimen other than nasopharyngeal swab, presence of viral mutation(s) within the areas targeted by this assay, and inadequate number of viral copies(<138 copies/mL). A negative result must be combined with clinical observations, patient history, and epidemiological information. The expected result is Negative.  Fact Sheet for Patients:  BloggerCourse.com  Fact Sheet for Healthcare Providers:  SeriousBroker.it  This test is no t yet approved or cleared by the [place]  FDA and  has been authorized for detection and/or diagnosis of SARS-CoV-2 by FDA under an Emergency Use Authorization (EUA). This EUA will remain  in effect (meaning this test can be used) for the duration of the COVID-19 declaration under Section 564(b)(1) of the Act, 21 U.S.C.section 360bbb-3(b)(1), unless the authorization is terminated  or revoked sooner.       Influenza A by PCR NEGATIVE NEGATIVE Final   Influenza B by PCR NEGATIVE  NEGATIVE Final    Comment: (NOTE) The Xpert Xpress SARS-CoV-2/FLU/RSV plus assay is intended as an aid in the diagnosis of influenza from Nasopharyngeal swab specimens and should not be used as a sole basis for treatment. Nasal washings and aspirates are unacceptable for Xpert Xpress SARS-CoV-2/FLU/RSV testing.  Fact Sheet for Patients: BloggerCourse.com  Fact Sheet for Healthcare Providers: SeriousBroker.it  This test is not yet approved or cleared by the [place]  FDA and has been authorized for detection and/or diagnosis of SARS-CoV-2 by FDA under an Emergency Use Authorization (EUA). This EUA will remain in effect (meaning this test can be used) for the duration of the COVID-19 declaration under Section 564(b)(1) of the Act, 21 U.S.C. section 360bbb-3(b)(1), unless the authorization is terminated or revoked.     Resp Syncytial Virus by PCR NEGATIVE NEGATIVE Final    Comment: (NOTE) Fact Sheet for Patients: BloggerCourse.com  Fact Sheet for Healthcare Providers: SeriousBroker.it  This test is not yet approved or cleared by the [place]  FDA and has been authorized for detection and/or diagnosis of SARS-CoV-2 by FDA under an Emergency Use Authorization (EUA). This EUA will remain in effect (meaning this test can be used) for the duration of the COVID-19 declaration under Section 564(b)(1) of the Act, 21 U.S.C. section 360bbb-3(b)(1), unless the authorization is terminated or revoked.  Performed at [facility], [address] [place], [place] [address]   Blood Culture (routine x 2)     Status: None (Preliminary result)   Collection Time: 01/29/24  9:20 PM   Specimen: BLOOD  Result Value Ref Range Status   Specimen Description BLOOD RIGHT ANTECUBITAL  Final   Special Requests   Final    BOTTLES DRAWN AEROBIC AND ANAEROBIC Blood Culture results may not  be optimal due to an excessive volume of blood received in culture bottles   Culture   Final    NO GROWTH 4 DAYS Performed at [facility], [address] [place], [place] [address]    Report Status PENDING  Incomplete  Respiratory (~20 pathogens) panel by PCR     Status: None   Collection Time: 01/30/24  9:10 AM   Specimen: Nasopharyngeal Swab; Respiratory  Result Value Ref Range Status   Adenovirus NOT DETECTED NOT DETECTED Final   Coronavirus 229E NOT DETECTED NOT DETECTED Final    Comment: (NOTE) The Coronavirus on the Respiratory Panel, DOES  NOT test for the novel  Coronavirus (2019 nCoV)    Coronavirus HKU1 NOT DETECTED NOT DETECTED Final   Coronavirus NL63 NOT DETECTED NOT DETECTED Final   Coronavirus OC43 NOT DETECTED NOT DETECTED Final   Metapneumovirus NOT DETECTED NOT DETECTED Final   Rhinovirus / Enterovirus NOT DETECTED NOT DETECTED Final   Influenza A NOT DETECTED NOT DETECTED Final   Influenza B NOT DETECTED NOT DETECTED Final   Parainfluenza Virus 1 NOT DETECTED NOT DETECTED Final   Parainfluenza Virus 2 NOT DETECTED NOT DETECTED Final   Parainfluenza Virus 3 NOT DETECTED NOT DETECTED Final   Parainfluenza Virus 4 NOT DETECTED NOT DETECTED Final   Respiratory Syncytial Virus NOT DETECTED NOT DETECTED Final   Bordetella pertussis NOT DETECTED NOT DETECTED Final   Bordetella Parapertussis NOT DETECTED NOT DETECTED Final   Chlamydophila pneumoniae NOT DETECTED NOT DETECTED Final   Mycoplasma pneumoniae NOT DETECTED NOT DETECTED Final    Comment: Performed at [facility], [address], [place], [place] [address]         Radiology Studies: No results found.      Scheduled Meds:  amiodarone   200 mg Oral BID   Followed by   Cecily Cohen ON 02/11/2024] amiodarone   200 mg Oral Daily   apixaban   5 mg Oral BID   busPIRone   10 mg Oral BID   carvedilol   25 mg Oral BID WC   clopidogrel   75 mg Oral Daily   digoxin   0.125 mg Oral Daily   doxycycline    100 mg Oral Q12H   feeding supplement  237 mL Oral BID BM   furosemide   40 mg Intravenous BID   insulin  aspart  0-5 Units Subcutaneous QHS   insulin  aspart  0-9 Units Subcutaneous TID WC   levothyroxine   75 mcg Oral Q0600   mirabegron  ER  25 mg Oral Daily   multivitamin with minerals  1 tablet Oral Daily   pantoprazole   20 mg Oral Daily   PARoxetine   40 mg Oral QPM   rosuvastatin   5 mg Oral Daily   sodium chloride  flush  3 mL Intravenous Q12H   Continuous Infusions:  cefTRIAXone  (ROCEPHIN )  IV 1 g (02/01/24 2218)   magnesium  sulfate bolus IVPB 2 g (02/02/24 0841)     LOS: 4 days      Alphonsus Jeans, MD [facility] Pager [phone]  If 7PM-7AM, please contact night-coverage [email or web address] 02/02/2024, 8:56 AM

## 2024-02-03 DIAGNOSIS — I480 Paroxysmal atrial fibrillation: Secondary | ICD-10-CM | POA: Diagnosis not present

## 2024-02-03 LAB — BASIC METABOLIC PANEL WITH GFR
Anion gap: 13 (ref 5–15)
BUN: 27 mg/dL — ABNORMAL HIGH (ref 8–23)
CO2: 33 mmol/L — ABNORMAL HIGH (ref 22–32)
Calcium: 9.6 mg/dL (ref 8.9–10.3)
Chloride: 92 mmol/L — ABNORMAL LOW (ref 98–111)
Creatinine, Ser: 0.61 mg/dL (ref 0.44–1.00)
GFR, Estimated: >60 mL/min (ref >60)
Glucose, Bld: 141 mg/dL — ABNORMAL HIGH (ref 70–99)
Potassium: 3.7 mmol/L (ref 3.5–5.1)
Sodium: 138 mmol/L (ref 135–145)

## 2024-02-03 LAB — GLUCOSE, CAPILLARY
Glucose-Capillary: 157 mg/dL — ABNORMAL HIGH (ref 70–99)
Glucose-Capillary: 167 mg/dL — ABNORMAL HIGH (ref 70–99)
Glucose-Capillary: 159 mg/dL — ABNORMAL HIGH (ref 70–99)
Glucose-Capillary: 146 mg/dL — ABNORMAL HIGH (ref 70–99)

## 2024-02-03 LAB — CBC
HCT: 37.6 % (ref 36.0–46.0)
Hemoglobin: 11.6 g/dL — ABNORMAL LOW (ref 12.0–15.0)
MCH: 30.4 pg (ref 26.0–34.0)
MCHC: 30.9 g/dL (ref 30.0–36.0)
MCV: 98.7 fL (ref 80.0–100.0)
Platelets: 230 10*3/uL (ref 150–400)
RBC: 3.81 MIL/uL — ABNORMAL LOW (ref 3.87–5.11)
RDW: 14 % (ref 11.5–15.5)
WBC: 8.5 10*3/uL (ref 4.0–10.5)
nRBC: 0 % (ref 0.0–0.2)

## 2024-02-03 LAB — MAGNESIUM: Magnesium: 2 mg/dL (ref 1.7–2.4)

## 2024-02-03 MED ORDER — DAPAGLIFLOZIN PROPANEDIOL 10 MG PO TABS
10.0000 mg | ORAL_TABLET | Freq: Every day | ORAL | Status: DC
  Administered 2024-02-03 – 2024-02-13 (×11): 10 mg via ORAL
  Filled 2024-02-03 (×11): qty 1

## 2024-02-03 MED ORDER — MAGNESIUM SULFATE 2 GM/50ML IV SOLN
2.0000 g | Freq: Once | INTRAVENOUS | Status: AC
  Administered 2024-02-03: 2 g via INTRAVENOUS
  Filled 2024-02-03: qty 50

## 2024-02-03 MED ORDER — DILTIAZEM HCL 30 MG PO TABS
30.0000 mg | ORAL_TABLET | Freq: Four times a day (QID) | ORAL | Status: DC
  Administered 2024-02-03 – 2024-02-07 (×15): 30 mg via ORAL
  Filled 2024-02-03 (×15): qty 1

## 2024-02-03 MED ORDER — SODIUM CHLORIDE 0.9 % IV SOLN: INTRAVENOUS | Status: AC

## 2024-02-03 MED ORDER — DILTIAZEM HCL 25 MG/5ML IV SOLN
10.0000 mg | Freq: Once | INTRAVENOUS | Status: AC
  Administered 2024-02-03: 10 mg via INTRAVENOUS
  Filled 2024-02-03: qty 5

## 2024-02-03 MED ORDER — POTASSIUM CHLORIDE CRYS ER 20 MEQ PO TBCR
40.0000 meq | EXTENDED_RELEASE_TABLET | Freq: Once | ORAL | Status: AC
  Administered 2024-02-03: 40 meq via ORAL
  Filled 2024-02-03: qty 2

## 2024-02-03 NOTE — Progress Notes (Signed)
 Nutrition Follow-up  DOCUMENTATION CODES:   Non-severe (moderate) malnutrition in context of chronic illness  INTERVENTION:   -Once diet is advanced, resume:   -Liberalize diet to 2 gram sodium for wider variety of meal selections -MVI with minerals daily -Ensure Plus High Protein po BID, each supplement provides 350 kcal and 20 grams of protein   NUTRITION DIAGNOSIS:   Moderate Malnutrition related to chronic illness (CHF) as evidenced by mild fat depletion, moderate fat depletion, mild muscle depletion, moderate muscle depletion, percent weight loss.  Ongoing  GOAL:   Patient will meet greater than or equal to 90% of their needs  Progressing  MONITOR:   PO intake, Supplement acceptance  REASON FOR ASSESSMENT:   Malnutrition Screening Tool    ASSESSMENT:   Pt with medical history significant of PAF on Eliquis , HTN, CAD, asthma/COPD, chronic HFpEF, hypothyroidism, presented with palpitations.  Reviewed I/O's: -1.9 L x 24 hours and -4.9 L since admission  UOP: 2.3 L x 24 hours  Pt NPO for cardioversion today.   Pt previously on a 2 gram sodium diet and with improved oral intake. Meal completion 30-100%. Pt is consuming Ensure supplements.   Wt stable since admission.   Per TOC notes, plan to discharge home with home health services once medically stable.   Medications reviewed and include plavix  and lasix .   Labs reviewed: CBGS: 101-221 (inpatient orders for glycemic control are 0-5 units insulin  aspart daily at bedtime and 0-9 units insulin  aspart TID).    Diet Order:   Diet Order             Diet NPO time specified  Diet effective midnight           Diet NPO time specified Except for: Sips with Meds  Diet effective now                   EDUCATION NEEDS:   Education needs have been addressed  Skin:  Skin Assessment: Reviewed RN Assessment  Last BM:  02/02/24 (type 6)  Height:   Ht Readings from Last 1 Encounters:  01/29/24 5' 5 (1.651  m)    Weight:   Wt Readings from Last 1 Encounters:  02/03/24 63.3 kg    Ideal Body Weight:  56.8 kg  BMI:  Body mass index is 23.22 kg/m.  Estimated Nutritional Needs:   Kcal:  1700-1900  Protein:  85-100 grams  Fluid:  1.7-1.9 L    Herschel Lords, RD, LDN, CDCES Registered Dietitian III Certified Diabetes Care and Education Specialist If unable to reach this RD, please use RD Inpatient group chat on secure chat between hours of 8am-4 pm daily

## 2024-02-03 NOTE — Plan of Care (Signed)

## 2024-02-03 NOTE — Progress Notes (Signed)
 Physical Therapy Treatment Patient Details Name: Mary [name] MRN: [id number] DOB: Jun 15, 1935 Today's Date: 02/03/2024   History of Present Illness Pt is an 89 y.o. female admitted for AFIB with RVR, hypokalemia, & hypomagnesemia. PMH significant for PAF on Eliquis , HTN, CAD, asthma/COPD, chronic HFpEF, HLD, RA, sleep apnea, spinal stenosis, recurrent nosebleed and lumbar DDD. Recently returned home from STR 5 days ago.    PT Comments  Today's tx involved a continuation of mobility and ambulation to improve overall strength and activity tolerance. Pt able to perform transfers with supervision a, min verbal cues given for manipulation of RW. Ambulation with RW with CGA and no LOB experienced. Pt remains on 2L of O2 during mobility. Pt progressing toward overall goals and would continue to benefit from skilled PT interventions.    If plan is discharge home, recommend the following: A little help with walking and/or transfers;A little help with bathing/dressing/bathroom;Assist for transportation;Help with stairs or ramp for entrance   Can travel by private vehicle        Equipment Recommendations  None recommended by PT    Recommendations for Other Services       Precautions / Restrictions Precautions Precautions: Fall Recall of Precautions/Restrictions: Intact Restrictions Weight Bearing Restrictions Per Provider Order: No     Mobility  Bed Mobility               General bed mobility comments: NT -in chair before and after    Transfers Overall transfer level: Modified independent Equipment used: Rolling walker (2 wheels) Transfers: Sit to/from Stand Sit to Stand: Supervision           General transfer comment: cues for safety    Ambulation/Gait Ambulation/Gait assistance: Contact guard assist Gait Distance (Feet): 80 Feet Assistive device: Rolling walker (2 wheels) Gait Pattern/deviations: Step-through pattern, Decreased step length - right, Decreased step  length - left       General Gait Details: pt ambulating with RW, min verbal cues given for manipulation of RW   Stairs             Wheelchair Mobility     Tilt Bed    Modified Rankin (Stroke Patients Only)       Balance Overall balance assessment: Independent Sitting-balance support: Feet supported Sitting balance-Leahy Scale: Normal Sitting balance - Comments: in recliner, no LOB   Standing balance support: Bilateral upper extremity supported, During functional activity, Reliant on assistive device for balance Standing balance-Leahy Scale: Good Standing balance comment: verbal and tactile cue given intitally on manipulation of RW                            Communication Communication Communication: No apparent difficulties Factors Affecting Communication: Reduced clarity of speech  Cognition Arousal: Alert Behavior During Therapy: WFL for tasks assessed/performed   PT - Cognitive impairments: No apparent impairments                         Following commands: Intact      Cueing Cueing Techniques: Verbal cues, Tactile cues  Exercises      General Comments        Pertinent Vitals/Pain Pain Assessment Pain Assessment: No/denies pain    Home Living                          Prior Function  PT Goals (current goals can now be found in the care plan section) Acute Rehab PT Goals Patient Stated Goal: return home PT Goal Formulation: With patient Time For Goal Achievement: 02/13/24 Potential to Achieve Goals: Good Progress towards PT goals: Progressing toward goals    Frequency    Min 2X/week      PT Plan      Co-evaluation              AM-PAC PT 6 Clicks Mobility   Outcome Measure  Help needed turning from your back to your side while in a flat bed without using bedrails?: A Little Help needed moving from lying on your back to sitting on the side of a flat bed without using bedrails?:  A Little Help needed moving to and from a bed to a chair (including a wheelchair)?: A Little Help needed standing up from a chair using your arms (e.g., wheelchair or bedside chair)?: A Little Help needed to walk in hospital room?: A Little Help needed climbing 3-5 steps with a railing? : A Little 6 Click Score: 18    End of Session Equipment Utilized During Treatment: Oxygen Activity Tolerance: Patient tolerated treatment well Patient left: in chair;with call bell/phone within reach Nurse Communication: Mobility status PT Visit Diagnosis: Muscle weakness (generalized) (M62.81)     Time: 0454-0981 PT Time Calculation (min) (ACUTE ONLY): 13 min  Charges:                              Jolane Bankhead Romero-Perozo, SPT  02/03/2024, 2:13 PM

## 2024-02-03 NOTE — Progress Notes (Signed)
 [facility] CLINIC CARDIOLOGY PROGRESS NOTE       [name] MRN: [id number] DOB/AGE: 1934-11-26 89 y.o.  Admit date: 01/29/2024 Referring Physician Dr. Hines Ludwig Primary Physician Monique Ano, MD Primary Cardiologist Dr. Larinda Plover Reason for Consultation AF RVR  HPI: Mary [name] is a 89 y.o. female  with a past medical history of paroxsymal atrial fibrillation (on Eliquis ), chronic HFpEF, coronary artery disease (on CT), hypertension, hyperlipidemia, T2DM, right carotid stenosis s/p CEA, pulmonary hypertension, COPD/asthma who presented to the ED on 01/29/2024 for shortness of breath, palpitations and lower abdominal pain. EKG in ED revealed AF RVR with rates 160s. Cardiology was consulted for further evaluation.   Interval History: -Patient seen and examined this AM and sitting up and resting comfortably in hospital bed. Patient states she continues to feel better and states SOB continues to improve. Denies chest pain or palpitations.   -Patients BP improving and HR elevated today -Per tele remains in AF rates 100-110s this AM. S/p IV 10 mg dilt and IV Mg 2g HR improved to 80s-90s with stable BP.  -UOP yesterday 2.2L with stable renal function.   -Patient remains on 2L [place] with stable SpO2.  -Planned for DCCV today with Dr. Braxton Calico, however patient reportedly ate breakfast. DCCV cancelled. -Re-scheduled for Tuesday with Dr. Bob Burn TEE/DCCV at 12PM if rates cannot be controlled.    Review of systems complete and found to be negative unless listed above    Past Medical History:  Diagnosis Date   Acid reflux    Anemia    Arthritis    Asthma    Atherosclerosis of abdominal aorta (HCC)    B12 deficiency    Clostridium difficile infection    H/O   Coronary artery disease    DDD (degenerative disc disease), lumbar    Depression    Diabetes (HCC)    type 2   Diabetic retinopathy (HCC)    Dysrhythmia    Heart murmur    HLD (hyperlipidemia)    HTN (hypertension)     Hypotension    when get up too quickly   Hypothyroidism    Pneumonia    PONV (postoperative nausea and vomiting)    Pulmonary nodule    right middle lobe on CT scan   Pure hypercholesterolemia    RA (rheumatoid arthritis) (HCC)    Sleep apnea    Spinal stenosis    Urinary urgency     Past Surgical History:  Procedure Laterality Date   ABDOMINAL HYSTERECTOMY     CATARACT EXTRACTION, BILATERAL     COLONOSCOPY N/A 07/29/2021   Procedure: COLONOSCOPY;  Surgeon: Toledo, Alphonsus Jeans, MD;  Location: [facility] ENDOSCOPY;  Service: Gastroenterology;  Laterality: N/A;   CYSTOSCOPY     ENDARTERECTOMY Right 12/02/2021   Procedure: ENDARTERECTOMY CAROTID;  Surgeon: Celso College, MD;  Location: [facility] ORS;  Service: Vascular;  Laterality: Right;   ESOPHAGOGASTRODUODENOSCOPY N/A 07/29/2021   Procedure: ESOPHAGOGASTRODUODENOSCOPY (EGD);  Surgeon: Toledo, Alphonsus Jeans, MD;  Location: [facility] ENDOSCOPY;  Service: Gastroenterology;  Laterality: N/A;  DM   HIP ARTHROPLASTY Right 09/24/2015   Procedure: ARTHROPLASTY BIPOLAR HIP (HEMIARTHROPLASTY);  Surgeon: Elner Hahn, MD;  Location: [facility] ORS;  Service: Orthopedics;  Laterality: Right;   HIP ARTHROPLASTY Left 09/29/2018   Procedure: ARTHROPLASTY BIPOLAR HIP (HEMIARTHROPLASTY) LEFT;  Surgeon: Elner Hahn, MD;  Location: [facility] ORS;  Service: Orthopedics;  Laterality: Left;   KNEE ARTHROSCOPY W/ AUTOGENOUS CARTILAGE IMPLANTATION (ACI) PROCEDURE     TOTAL KNEE ARTHROPLASTY  Left 12/01/2017   Procedure: TOTAL KNEE ARTHROPLASTY;  Surgeon: Elner Hahn, MD;  Location: [facility] ORS;  Service: Orthopedics;  Laterality: Left;   TOTAL KNEE ARTHROPLASTY Right 2014   VARICOSE VEIN SURGERY Left    leg    Medications Prior to Admission  Medication Sig Dispense Refill Last Dose/Taking   acetaminophen  (TYLENOL ) 500 MG tablet Take 500 mg by mouth every 6 (six) hours as needed for mild pain (pain score 1-3).   Taking As Needed   azelastine (OPTIVAR) 0.05 % ophthalmic solution  Place 1 drop into both eyes daily as needed (allergies).   Taking As Needed   busPIRone  (BUSPAR ) 10 MG tablet Take 10 mg by mouth 2 (two) times daily.   01/29/2024   carvedilol  (COREG ) 6.25 MG tablet Take 6.25 mg by mouth 2 (two) times daily with a meal.   01/29/2024   cyclobenzaprine  (FLEXERIL ) 5 MG tablet Take 1 tablet (5 mg total) by mouth 3 (three) times daily as needed for muscle spasms.   Taking As Needed   furosemide  (LASIX ) 40 MG tablet Take 1 tablet (40 mg total) by mouth daily.   Past Week   Iron -Vitamin C  (VITRON-C) 65-125 MG TABS Take 1 tablet by mouth daily. 90 tablet 1 Past Week   levothyroxine  (SYNTHROID , LEVOTHROID) 75 MCG tablet Take 75 mcg by mouth daily.    01/29/2024   losartan  (COZAAR ) 25 MG tablet Take 1 tablet by mouth daily.   Taking   metFORMIN  (GLUCOPHAGE -XR) 500 MG 24 hr tablet Take 500 mg by mouth daily. With dinner   01/29/2024   pantoprazole  (PROTONIX ) 20 MG tablet Take 20 mg by mouth daily.   01/29/2024   PARoxetine  (PAXIL ) 40 MG tablet Take 40 mg by mouth every evening.   01/29/2024   potassium chloride  SA (KLOR-CON  M) 20 MEQ tablet Take 20 mEq by mouth 2 (two) times daily.  Take 1 tablet (20 mEq total) by mouth 2 (two) times daily With lasix    01/29/2024   rosuvastatin  (CRESTOR ) 5 MG tablet Take 1 tablet (5 mg total) by mouth daily. 30 tablet 0 Past Week   vitamin B-12 (CYANOCOBALAMIN ) 500 MCG tablet Take 500 mcg by mouth every other day.   01/29/2024   apixaban  (ELIQUIS ) 5 MG TABS tablet Take 1 tablet (5 mg total) by mouth 2 (two) times daily. (Patient not taking: Reported on 01/30/2024)   Not Taking   carvedilol  (COREG ) 25 MG tablet Take 2 tablets (50 mg total) by mouth 2 (two) times daily with a meal. (Patient not taking: Reported on 01/30/2024)   Not Taking   carvedilol  (COREG ) 25 MG tablet Take 25 mg by mouth 2 (two) times daily with a meal. (Patient not taking: Reported on 01/30/2024)   Not Taking   clopidogrel  (PLAVIX ) 75 MG tablet Take 75 mg by mouth daily. (Patient not taking:  Reported on 01/30/2024)   Not Taking   digoxin  (LANOXIN ) 0.125 MG tablet Take 1 tablet (0.125 mg total) by mouth daily. (Patient not taking: Reported on 01/30/2024)   Not Taking   irbesartan  (AVAPRO ) 150 MG tablet Take 1 tablet (150 mg total) by mouth daily. (Patient not taking: Reported on 01/30/2024)   Not Taking   traZODone  (DESYREL ) 50 MG tablet Take 0.5 tablets (25 mg total) by mouth at bedtime as needed for sleep. (Patient not taking: Reported on 01/30/2024)   Not Taking   Vibegron  (GEMTESA ) 75 MG TABS Take 1 tablet (75 mg total) by mouth daily at 6 (six) AM. (Patient  not taking: Reported on 01/30/2024)   Not Taking   Social History   Socioeconomic History   Marital status: Widowed    Spouse name: Not on file   Number of children: Not on file   Years of education: Not on file   Highest education level: Not on file  Occupational History   Occupation: retired  Tobacco Use   Smoking status: Never   Smokeless tobacco: Never  Vaping Use   Vaping status: Never Used  Substance and Sexual Activity   Alcohol  use: No    Alcohol /week: 0.0 standard drinks of alcohol    Drug use: No   Sexual activity: Not Currently  Other Topics Concern   Not on file  Social History Narrative   Lives with daughter   Social Drivers of Health   Financial Resource Strain: Low Risk  (09/28/2023)   Received from [facility]   Overall Financial Resource Strain (CARDIA)    Difficulty of Paying Living Expenses: Not hard at all  Food Insecurity: No Food Insecurity (01/30/2024)   Hunger Vital Sign    Worried About Running Out of Food in the Last Year: Never true    Ran Out of Food in the Last Year: Never true  Transportation Needs: No Transportation Needs (01/30/2024)   PRAPARE - Administrator, Civil Service (Medical): No    Lack of Transportation (Non-Medical): No  Physical Activity: Not on file  Stress: Not on file  Social Connections: Moderately Isolated (01/30/2024)   Social Connection  and Isolation Panel    Frequency of Communication with Friends and Family: More than three times a week    Frequency of Social Gatherings with Friends and Family: More than three times a week    Attends Religious Services: 1 to 4 times per year    Active Member of [employer] or Organizations: No    Attends Banker Meetings: Never    Marital Status: Widowed  Intimate Partner Violence: Not At Risk (01/30/2024)   Humiliation, Afraid, Rape, and Kick questionnaire    Fear of Current or Ex-Partner: No    Emotionally Abused: No    Physically Abused: No    Sexually Abused: No    Family History  Problem Relation Age of Onset   Kidney disease Brother        also nephew   Heart disease Mother    Heart disease Father    Prostate cancer Neg Hx    Bladder Cancer Neg Hx    Breast cancer Neg Hx    Kidney cancer Neg Hx      Vitals:   02/03/24 0331 02/03/24 0350 02/03/24 0500 02/03/24 0803  BP: (!) 139/123 (!) 116/54  131/69  Pulse: 86 64  65  Resp: 17 20    Temp: (!) 97.4 F (36.3 C) 97.6 F (36.4 C)  97.7 F (36.5 C)  TempSrc:  Oral  Oral  SpO2: 94%   91%  Weight:   63.3 kg   Height:        PHYSICAL EXAM General: well appearing elderly female, well nourished, in no acute distress. HEENT: Normocephalic and atraumatic. Neck: No JVD.   Lungs: Normal respiratory effort on 2L [place]. Diminished breath sounds bilaterally.  Heart: Irregularly, irregular, rate controlled. Normal S1 and S2 without gallops or murmurs.  Abdomen: Non-distended appearing.  Msk: Normal strength and tone for age. Extremities: Warm and well perfused. No clubbing, cyanosis. No edema.  Neuro: Alert and oriented X 3. Psych:  Answers questions appropriately.   Labs: Basic Metabolic Panel: Recent Labs    02/02/24 0550 02/03/24 0527  NA 139 138  K 3.6 3.7  CL 98 92*  CO2 33* 33*  GLUCOSE 136* 141*  BUN 27* 27*  CREATININE 0.63 0.61  CALCIUM  9.3 9.6  MG 1.7 2.0   Liver Function Tests: No results for  input(s): AST, ALT, ALKPHOS, BILITOT, PROT, ALBUMIN in the last 72 hours.  No results for input(s): LIPASE, AMYLASE in the last 72 hours. CBC: Recent Labs    02/02/24 0550 02/03/24 0527  WBC 7.4 8.5  HGB 11.1* 11.6*  HCT 36.2 37.6  MCV 99.5 98.7  PLT 210 230   Cardiac Enzymes: No results for input(s): CKTOTAL, CKMB, CKMBINDEX, TROPONINIHS in the last 72 hours.  BNP: No results for input(s): BNP in the last 72 hours.  D-Dimer: No results for input(s): DDIMER in the last 72 hours. Hemoglobin A1C: No results for input(s): HGBA1C in the last 72 hours.  Fasting Lipid Panel: No results for input(s): CHOL, HDL, LDLCALC, TRIG, CHOLHDL, LDLDIRECT in the last 72 hours. Thyroid Function Tests: No results for input(s): TSH, T4TOTAL, T3FREE, THYROIDAB in the last 72 hours.  Invalid input(s): FREET3  Anemia Panel: No results for input(s): VITAMINB12, FOLATE, FERRITIN, TIBC, IRON , RETICCTPCT in the last 72 hours.   Radiology: [facility] Chest Port 1 View Result Date: 01/29/2024 CLINICAL DATA:  Shortness of breath EXAM: PORTABLE CHEST 1 VIEW COMPARISON:  01/08/2024 FINDINGS: Heart and mediastinal contours within normal limits. Patchy bilateral airspace opacities, right greater than left. No visible significant effusions. No acute bony abnormality. IMPRESSION: Patchy bilateral airspace opacities concerning for infection. Electronically Signed   By: Janeece Mechanic M.D.   On: 01/29/2024 17:31   DG Chest 1 View Result Date: 01/08/2024 CLINICAL DATA:  Congestive heart failure. EXAM: CHEST  1 VIEW COMPARISON:  01/07/2024 FINDINGS: Low volume film. The cardio pericardial silhouette is enlarged. There is pulmonary vascular congestion without overt pulmonary edema. Trace blunting of the left costophrenic angle raises the question of tiny effusion. No acute bony abnormality. Telemetry leads overlie the chest. IMPRESSION: Low volume film with pulmonary  vascular congestion. Possible tiny left pleural effusion. Electronically Signed   By: Donnal Fusi M.D.   On: 01/08/2024 07:22   DG Cervical Spine 2 or 3 views Result Date: 01/07/2024 CLINICAL DATA:  40981 Neck pain 19147 EXAM: CERVICAL SPINE - 2-3 VIEW COMPARISON:  December 07, 2023. FINDINGS: The cervical spine is visualized from C1-C7. Straightening of the cervical lordosis without significant spondylolisthesis. Vertebral body heights are mantle maintained: No evidence of acute fracture. Moderate multilevel intervertebral disc space height loss throughout the cervical spine most pronounced at C4-5, C5-6 and C6-7. Multilevel facet arthropathy and uncovertebral hypertrophy. Limited assessment of the atlantooccipital interval due to overlapping tissue. No prevertebral soft tissue swelling. Prominent contour of the LEFT superior hilum. IMPRESSION: 1. Moderate multilevel degenerative changes of the cervical spine. 2. There is a prominent contour of the LEFT superior hilum. This could reflect a similar prominent pulmonary artery, the lymphadenopathy or space-occupying lesion remains in the differential. Consider further evaluation with dedicated chest CT if clinically indicated. Electronically Signed   By: Clancy Crimes M.D.   On: 01/07/2024 16:44   DG Chest Portable 1 View Result Date: 01/07/2024 CLINICAL DATA:  CP EXAM: PORTABLE CHEST 1 VIEW COMPARISON:  December 09, 2023 FINDINGS: The cardiomediastinal silhouette is unchanged in contour.Atherosclerotic calcifications. Trace LEFT pleural effusion. No pneumothorax. No acute pleuroparenchymal abnormality. IMPRESSION: Trace  LEFT pleural effusion. Electronically Signed   By: Clancy Crimes M.D.   On: 01/07/2024 14:49    ECHO 12/08/2023  1. Left ventricular ejection fraction, by estimation, is 60 to 65%. The left ventricle has normal function. The left ventricle has no regional wall motion abnormalities. Left ventricular diastolic parameters are  indeterminate.   2. Right ventricular systolic function is normal. The right ventricular size is normal.   3. Left atrial size was mildly dilated.   4. Large pleural effusion.   5. The mitral valve is normal in structure. Trivial mitral valve regurgitation. No evidence of mitral stenosis.   6. The aortic valve is normal in structure. Aortic valve regurgitation is not visualized. No aortic stenosis is present.   7. The inferior vena cava is normal in size with greater than 50% respiratory variability, suggesting right atrial pressure of 3 mmHg.   TELEMETRY reviewed by me 02/03/2024: atrial fibrillation, rate 100-110s (AM). Improved 80-90s (afternoon).  EKG reviewed by me: atrial fibrillation RVR, rate 135 bpm  Data reviewed by me 02/03/2024: last 24h vitals tele labs imaging I/O hospitalist progress note  Principal Problem:   CHF (congestive heart failure) (HCC) Active Problems:   Diabetes mellitus without complication (HCC)   Hypothyroidism   Asthma   Coronary artery disease involving native coronary artery of native heart   Renal artery stenosis (HCC)   Hypertension, renovascular   A-fib (HCC)   Malnutrition of moderate degree    ASSESSMENT AND PLAN:   Mary [name] is a 89 y.o. female  with a past medical history of paroxsymal atrial fibrillation (on Eliquis ), chronic HFpEF, coronary artery disease (on CT), hypertension, hyperlipidemia, T2DM, right carotid stenosis s/p CEA, pulmonary hypertension, COPD/asthma who presented to the ED on 01/29/2024 for shortness of breath, palpitations and lower abdominal pain. EKG in ED revealed AF RVR with rates 160s. Patient recently hospitalized on 04/16 with AF RVR and AoCHFpEF with similar presentation.  Cardiology was consulted for further evaluation.   # Paroxymal atrial fibrillation  Patient reports palpitations, SOB for past 3 weeks and presents on admission with AF RVR in 160s. IV Caredizem gtt started in ED. Per tele patient remains in AF with  rates 100-110s in AM, HR improved 80-90s s/p 1x IV dilt 10 mg and IV Mg 2g. BP stable -Monitor and replenish electrolytes for a goal K >4, Mag >2  -Continue Eliquis  5 mg twice daily for stroke risk reduction.  -Continue PO digoxin  0.125 mg daily. -Continue PO amio 200 mg BID for 10 days, then 200 mg daily.  -Ordered 30 mg diltiazem  q6hrs. Uptitrate if BP allows.  -Continue metoprolol  succinate 100 mg daily to start tomorrow.  -DCCV cancelled (06/13) because pt ate breakfast -Rescheduled TEE/DCCV with Dr. Bob Burn for Tuesday (06/17) if heart rate is not controlled.   # Acute on chronic HFpEF Patient presents with worsening SOB for past 3 weeks. CXR with patchy bilateral airspace opacities. BNP elevated at 820.Echo from 11/2023 with pEF (60-65%), no RWMA. -Continue IV lasix  40 mg BID. Consider transitioning to PO tomorrow.  Closely monitor UOP, electrolytes, renal function. -Beta blocker as stated above. -Ordered Farxiga 10 mg daily. (Per pharmacy copay [phone] - spoke to patient about cost and patient agreeable to starting medication).   # Coronary artery disease # Hypertension # Hyperlipidemia Patient denies chest pain. Troponin minimally elevated and flat 19 > 20.  EKG in ED with atrial fibrillation RVR, rate 135 bpm. -Continue Plavix  75 mg daily, rosuvastatin  5 mg daily.  -  Beta blocker as stated above. -Home ARB held due to borderline low BP and allow room for uptitration of diltiazem  for rate control.  -Minimally elevated and flat trops in setting of AF RVR and AOCHFpEF is most consistent with demand/supply mismatch and not ACS    This patient's plan of care was discussed and created with Dr. Custovic and she is in agreement.  Signed: Creighton Doffing, PA-C  02/03/2024, 10:44 AM [facility]

## 2024-02-03 NOTE — Progress Notes (Signed)
 PROGRESS NOTE    Mary [name]  ZOX:[id number] DOB: [birth year] DOA: 01/29/2024 PCP: Monique Ano, MD   Assessment & Plan:   Principal Problem:   CHF (congestive heart failure) (HCC) Active Problems:   Hypothyroidism   Diabetes mellitus without complication (HCC)   Asthma   Coronary artery disease involving native coronary artery of native heart   Renal artery stenosis (HCC)   Hypertension, renovascular   A-fib (HCC)   Malnutrition of moderate degree  Assessment and Plan: A-fib: with RVR. Continue on amio, metoprolol , digoxin , diltiazem  as per cardio. Cardioversion canceled 02/03/24 b/c pt ate breakfast. Continue on tele. Cardio following and recs apprec   Chronic diastolic CHF: continue on lasix . Monitor I/Os. No CP currently    Pulmonary infiltrates: likely pneumonia. Completed abx course. Bronchodilators prn. Encourage incentive spirometry    Acute hypoxic respiratory failure: continue on supplemental oxygen and wean as tolerated. Unable to wean from supplemental oxygen so far and will possibly have to d/c home with oxygen    Hypokalemia: WNL today    History recurrent UTIs: urine cx growing multiple species. C/o dysuria. ? acute UTI. Continue on IV rocephin .    Hypothyroidism: continue on home dose of levothyroxine     DM2: fair control, HbA1c 7.0. Continue on SSI w/ accuchecks    HTN: continue on coreg    HLD: continue on statin   GAD: severity unknown. Continue on home dose of paroxetine    Debility: PT/OT recs HH       DVT prophylaxis: eliquis  Code Status: full  Family Communication: Disposition Plan: likely d/c back home   Level of care: Progressive  Status is: Inpatient Remains inpatient appropriate because: severity of illness    Consultants:  Cardio   Procedures:   Antimicrobials:    Subjective: Pt c/o fatigue   Objective: Vitals:   02/03/24 0331 02/03/24 0350 02/03/24 0500 02/03/24 0803  BP: (!) 139/123 (!) 116/54  131/69  Pulse:  86 64  65  Resp: 17 20    Temp: (!) 97.4 F (36.3 C) 97.6 F (36.4 C)  97.7 F (36.5 C)  TempSrc:  Oral  Oral  SpO2: 94%   91%  Weight:   63.3 kg   Height:        Intake/Output Summary (Last 24 hours) at 02/03/2024 0924 Last data filed at 02/02/2024 2115 Gross per 24 hour  Intake 340.06 ml  Output 1550 ml  Net -1209.94 ml   Filed Weights   02/01/24 0529 02/02/24 0500 02/03/24 0500  Weight: 62 kg 62.7 kg 63.3 kg    Examination:  General exam: appears calm & comfortable  Respiratory system: diminished breath sounds b/l  Cardiovascular system: irregularly irregular  Gastrointestinal system: abd is soft, NT, ND & hypoactive bowel sounds  Central nervous system: alert & awake. Moves all extremities  Psychiatry: Judgement and insight appears at baseline. Flat mood and affect    Data Reviewed: I have personally reviewed following labs and imaging studies  CBC: Recent Labs  Lab 01/29/24 1701 01/31/24 0400 02/01/24 0510 02/02/24 0550 02/03/24 0527  WBC 7.3 6.1 8.8 7.4 8.5  NEUTROABS 5.2  --   --   --   --   HGB 11.7* 12.0 11.6* 11.1* 11.6*  HCT 37.0 38.0 36.0 36.2 37.6  MCV 98.4 99.5 98.6 99.5 98.7  PLT 193 189 210 210 230   Basic Metabolic Panel: Recent Labs  Lab 01/30/24 0536 01/31/24 0400 02/01/24 0510 02/02/24 0550 02/03/24 0527  NA 137 137 136 139 138  K 3.3* 3.7 4.0 3.6 3.7  CL 100 99 97* 98 92*  CO2 30 28 30  33* 33*  GLUCOSE 141* 124* 134* 136* 141*  BUN 12 14 23  27* 27*  CREATININE 0.60 0.79 0.66 0.63 0.61  CALCIUM  8.9 9.1 9.0 9.3 9.6  MG 1.7 2.2 1.7 1.7 2.0   GFR: Estimated Creatinine Clearance: 45.4 mL/min (by C-G formula based on SCr of 0.61 mg/dL). Liver Function Tests: Recent Labs  Lab 01/29/24 1701  AST 33  ALT 8  ALKPHOS 49  BILITOT 1.4*  PROT 6.2*  ALBUMIN 2.6*   No results for input(s): LIPASE, AMYLASE in the last 168 hours. No results for input(s): AMMONIA in the last 168 hours. Coagulation Profile: No results for  input(s): INR, PROTIME in the last 168 hours. Cardiac Enzymes: No results for input(s): CKTOTAL, CKMB, CKMBINDEX, TROPONINI in the last 168 hours. BNP (last 3 results) No results for input(s): PROBNP in the last 8760 hours. HbA1C: No results for input(s): HGBA1C in the last 72 hours.  CBG: Recent Labs  Lab 02/02/24 0751 02/02/24 1147 02/02/24 1618 02/02/24 2126 02/03/24 0833  GLUCAP 142* 221* 101* 169* 157*   Lipid Profile: No results for input(s): CHOL, HDL, LDLCALC, TRIG, CHOLHDL, LDLDIRECT in the last 72 hours. Thyroid Function Tests: No results for input(s): TSH, T4TOTAL, FREET4, T3FREE, THYROIDAB in the last 72 hours.  Anemia Panel: No results for input(s): VITAMINB12, FOLATE, FERRITIN, TIBC, IRON , RETICCTPCT in the last 72 hours. Sepsis Labs: Recent Labs  Lab 01/29/24 1924 01/29/24 2120 01/31/24 0400  PROCALCITON  --   --  <0.10  LATICACIDVEN 1.1 1.4  --     Recent Results (from the past 240 hours)  Urine Culture (for pregnant, neutropenic or urologic patients or patients with an indwelling urinary catheter)     Status: Abnormal   Collection Time: 01/29/24  5:01 PM   Specimen: Urine, Clean Catch  Result Value Ref Range Status   Specimen Description   Final    URINE, CLEAN CATCH Performed at [facility], [address], [place], [place] [address]    Special Requests   Final    NONE Performed at [facility], [address] [place], [place] [address]    Culture MULTIPLE SPECIES PRESENT, SUGGEST RECOLLECTION (A)  Final   Report Status 01/31/2024 FINAL  Final  Blood Culture (routine x 2)     Status: None   Collection Time: 01/29/24  7:24 PM   Specimen: BLOOD  Result Value Ref Range Status   Specimen Description BLOOD RIGHT ANTECUBITAL  Final   Special Requests   Final    BOTTLES DRAWN AEROBIC AND ANAEROBIC Blood Culture results may not be optimal due to an excessive volume of blood  received in culture bottles   Culture   Final    NO GROWTH 5 DAYS Performed at [facility], [address] [place], [place] [address]    Report Status 02/03/2024 FINAL  Final  Resp panel by RT-PCR (RSV, Flu A&B, Covid) Anterior Nasal Swab     Status: None   Collection Time: 01/29/24  7:35 PM   Specimen: Anterior Nasal Swab  Result Value Ref Range Status   SARS Coronavirus 2 by RT PCR NEGATIVE NEGATIVE Final    Comment: (NOTE) SARS-CoV-2 target nucleic acids are NOT DETECTED.  The SARS-CoV-2 RNA is generally detectable in upper respiratory specimens during the acute phase of infection. The lowest concentration of SARS-CoV-2 viral copies this assay can detect is 138  copies/mL. A negative result does not preclude SARS-Cov-2 infection and should not be used as the sole basis for treatment or other patient management decisions. A negative result may occur with  improper specimen collection/handling, submission of specimen other than nasopharyngeal swab, presence of viral mutation(s) within the areas targeted by this assay, and inadequate number of viral copies(<138 copies/mL). A negative result must be combined with clinical observations, patient history, and epidemiological information. The expected result is Negative.  Fact Sheet for Patients:  BloggerCourse.com  Fact Sheet for Healthcare Providers:  SeriousBroker.it  This test is no t yet approved or cleared by the [place]  FDA and  has been authorized for detection and/or diagnosis of SARS-CoV-2 by FDA under an Emergency Use Authorization (EUA). This EUA will remain  in effect (meaning this test can be used) for the duration of the COVID-19 declaration under Section 564(b)(1) of the Act, 21 U.S.C.section 360bbb-3(b)(1), unless the authorization is terminated  or revoked sooner.       Influenza A by PCR NEGATIVE NEGATIVE Final   Influenza B by PCR NEGATIVE  NEGATIVE Final    Comment: (NOTE) The Xpert Xpress SARS-CoV-2/FLU/RSV plus assay is intended as an aid in the diagnosis of influenza from Nasopharyngeal swab specimens and should not be used as a sole basis for treatment. Nasal washings and aspirates are unacceptable for Xpert Xpress SARS-CoV-2/FLU/RSV testing.  Fact Sheet for Patients: BloggerCourse.com  Fact Sheet for Healthcare Providers: SeriousBroker.it  This test is not yet approved or cleared by the [place]  FDA and has been authorized for detection and/or diagnosis of SARS-CoV-2 by FDA under an Emergency Use Authorization (EUA). This EUA will remain in effect (meaning this test can be used) for the duration of the COVID-19 declaration under Section 564(b)(1) of the Act, 21 U.S.C. section 360bbb-3(b)(1), unless the authorization is terminated or revoked.     Resp Syncytial Virus by PCR NEGATIVE NEGATIVE Final    Comment: (NOTE) Fact Sheet for Patients: BloggerCourse.com  Fact Sheet for Healthcare Providers: SeriousBroker.it  This test is not yet approved or cleared by the [place]  FDA and has been authorized for detection and/or diagnosis of SARS-CoV-2 by FDA under an Emergency Use Authorization (EUA). This EUA will remain in effect (meaning this test can be used) for the duration of the COVID-19 declaration under Section 564(b)(1) of the Act, 21 U.S.C. section 360bbb-3(b)(1), unless the authorization is terminated or revoked.  Performed at [facility], [address] [place], [place] [address]   Blood Culture (routine x 2)     Status: None   Collection Time: 01/29/24  9:20 PM   Specimen: BLOOD  Result Value Ref Range Status   Specimen Description BLOOD RIGHT ANTECUBITAL  Final   Special Requests   Final    BOTTLES DRAWN AEROBIC AND ANAEROBIC Blood Culture results may not be optimal due to an  excessive volume of blood received in culture bottles   Culture   Final    NO GROWTH 5 DAYS Performed at [facility], [address] [place], [place] [address]    Report Status 02/03/2024 FINAL  Final  Respiratory (~20 pathogens) panel by PCR     Status: None   Collection Time: 01/30/24  9:10 AM   Specimen: Nasopharyngeal Swab; Respiratory  Result Value Ref Range Status   Adenovirus NOT DETECTED NOT DETECTED Final   Coronavirus 229E NOT DETECTED NOT DETECTED Final    Comment: (NOTE) The Coronavirus on the Respiratory Panel, DOES NOT test for  the novel  Coronavirus (2019 nCoV)    Coronavirus HKU1 NOT DETECTED NOT DETECTED Final   Coronavirus NL63 NOT DETECTED NOT DETECTED Final   Coronavirus OC43 NOT DETECTED NOT DETECTED Final   Metapneumovirus NOT DETECTED NOT DETECTED Final   Rhinovirus / Enterovirus NOT DETECTED NOT DETECTED Final   Influenza A NOT DETECTED NOT DETECTED Final   Influenza B NOT DETECTED NOT DETECTED Final   Parainfluenza Virus 1 NOT DETECTED NOT DETECTED Final   Parainfluenza Virus 2 NOT DETECTED NOT DETECTED Final   Parainfluenza Virus 3 NOT DETECTED NOT DETECTED Final   Parainfluenza Virus 4 NOT DETECTED NOT DETECTED Final   Respiratory Syncytial Virus NOT DETECTED NOT DETECTED Final   Bordetella pertussis NOT DETECTED NOT DETECTED Final   Bordetella Parapertussis NOT DETECTED NOT DETECTED Final   Chlamydophila pneumoniae NOT DETECTED NOT DETECTED Final   Mycoplasma pneumoniae NOT DETECTED NOT DETECTED Final    Comment: Performed at [facility], [address], [place], [place] [address]         Radiology Studies: No results found.      Scheduled Meds:  amiodarone   200 mg Oral BID   Followed by   Cecily Cohen ON 02/11/2024] amiodarone   200 mg Oral Daily   apixaban   5 mg Oral BID   busPIRone   10 mg Oral BID   clopidogrel   75 mg Oral Daily   digoxin   0.125 mg Oral Daily   feeding supplement  237 mL Oral BID BM   furosemide   40 mg  Intravenous BID   insulin  aspart  0-5 Units Subcutaneous QHS   insulin  aspart  0-9 Units Subcutaneous TID WC   levothyroxine   75 mcg Oral Q0600   metoprolol  succinate  100 mg Oral Daily   mirabegron  ER  25 mg Oral Daily   multivitamin with minerals  1 tablet Oral Daily   pantoprazole   20 mg Oral Daily   PARoxetine   40 mg Oral QPM   rosuvastatin   5 mg Oral Daily   sodium chloride  flush  3 mL Intravenous Q12H   Continuous Infusions:     LOS: 5 days      Alphonsus Jeans, MD [facility] Pager [phone]  If 7PM-7AM, please contact night-coverage [email or web address] 02/03/2024, 9:24 AM

## 2024-02-04 DIAGNOSIS — I480 Paroxysmal atrial fibrillation: Secondary | ICD-10-CM | POA: Diagnosis not present

## 2024-02-04 LAB — CBC
HCT: 37.6 % (ref 36.0–46.0)
Hemoglobin: 11.7 g/dL — ABNORMAL LOW (ref 12.0–15.0)
MCH: 30.8 pg (ref 26.0–34.0)
MCHC: 31.1 g/dL (ref 30.0–36.0)
MCV: 98.9 fL (ref 80.0–100.0)
Platelets: 233 10*3/uL (ref 150–400)
RBC: 3.8 MIL/uL — ABNORMAL LOW (ref 3.87–5.11)
RDW: 14.3 % (ref 11.5–15.5)
WBC: 9.9 10*3/uL (ref 4.0–10.5)
nRBC: 0 % (ref 0.0–0.2)

## 2024-02-04 LAB — MAGNESIUM: Magnesium: 2.1 mg/dL (ref 1.7–2.4)

## 2024-02-04 LAB — GLUCOSE, CAPILLARY
Glucose-Capillary: 214 mg/dL — ABNORMAL HIGH (ref 70–99)
Glucose-Capillary: 114 mg/dL — ABNORMAL HIGH (ref 70–99)
Glucose-Capillary: 186 mg/dL — ABNORMAL HIGH (ref 70–99)

## 2024-02-04 LAB — BASIC METABOLIC PANEL WITH GFR
Anion gap: 10 (ref 5–15)
BUN: 20 mg/dL (ref 8–23)
CO2: 35 mmol/L — ABNORMAL HIGH (ref 22–32)
Calcium: 9.6 mg/dL (ref 8.9–10.3)
Chloride: 93 mmol/L — ABNORMAL LOW (ref 98–111)
Creatinine, Ser: 0.54 mg/dL (ref 0.44–1.00)
GFR, Estimated: >60 mL/min (ref >60)
Glucose, Bld: 124 mg/dL — ABNORMAL HIGH (ref 70–99)
Potassium: 4.1 mmol/L (ref 3.5–5.1)
Sodium: 138 mmol/L (ref 135–145)

## 2024-02-04 NOTE — Progress Notes (Signed)
 PROGRESS NOTE    Mary [name]  ZOX:[id number] DOB: 06-Jul-1935 DOA: 01/29/2024 PCP: Monique Ano, MD   Assessment & Plan:   Principal Problem:   CHF (congestive heart failure) (HCC) Active Problems:   Hypothyroidism   Diabetes mellitus without complication (HCC)   Asthma   Coronary artery disease involving native coronary artery of native heart   Renal artery stenosis (HCC)   Hypertension, renovascular   A-fib (HCC)   Malnutrition of moderate degree  Assessment and Plan: A-fib: with RVR. Rate is better controlled today. Continue on metoprolol , amio, diltiazem , digoxin  as per cardio. Possible cardioversion on 02/07/24 as per cardio & cardioversion canceled on 02/03/24 b/l pt ate breakfast. Continue on tele. Cardio following and recs apprec   Chronic diastolic CHF: continue on lasix . Monitor I/Os. No CP currently    Pulmonary infiltrates: likely pneumonia. Completed abx course. Bronchodilators prn. Encourage incentive spirometry    Acute hypoxic respiratory failure: continue on supplemental oxygen and wean as tolerated. Unable to wean from supplemental oxygen so far and will possibly have to d/c home with oxygen    Hypokalemia: WNL today    History recurrent UTIs: urine cx growing multiple species. C/o dysuria. ? acute UTI. Continue on IV rocephin    Hypothyroidism: continue on home dose of levothyroxine     DM2: fair control, HbA1c 7.0. Continue on SSI w/ accuchecks    HTN: continue on coreg    HLD: continue on statin    GAD: severity unknown. Continue on home dose of paroxetine    Debility: PT/OT recs HH       DVT prophylaxis: eliquis  Code Status: full  Family Communication: Disposition Plan: likely d/c back home   Level of care: Progressive  Status is: Inpatient Remains inpatient appropriate because: severity of illness    Consultants:  Cardio   Procedures:   Antimicrobials:    Subjective: Pt c/o malaise  Objective: Vitals:   02/03/24 1946  02/04/24 0034 02/04/24 0427 02/04/24 0500  BP: 109/82 121/62 126/66   Pulse: 76 80 80   Resp: 18 16 16    Temp: 98.4 F (36.9 C) 98.4 F (36.9 C) 98.6 F (37 C)   TempSrc: Oral  Oral   SpO2: 95% 91% 94%   Weight:    63.2 kg  Height:        Intake/Output Summary (Last 24 hours) at 02/04/2024 0847 Last data filed at 02/04/2024 0624 Gross per 24 hour  Intake 763 ml  Output 1500 ml  Net -737 ml   Filed Weights   02/02/24 0500 02/03/24 0500 02/04/24 0500  Weight: 62.7 kg 63.3 kg 63.2 kg    Examination:  General exam: appears comfortable  Respiratory system: decreased breath sounds b/l  Cardiovascular system: irregularly irregular  Gastrointestinal system: abd is soft, NT, ND, hypoactive bowel sounds   Central nervous system: alert & awake. Moves all extremities  Psychiatry: Judgement and insight appears at baseline. Flat mood and affect    Data Reviewed: I have personally reviewed following labs and imaging studies  CBC: Recent Labs  Lab 01/29/24 1701 01/31/24 0400 02/01/24 0510 02/02/24 0550 02/03/24 0527 02/04/24 0544  WBC 7.3 6.1 8.8 7.4 8.5 9.9  NEUTROABS 5.2  --   --   --   --   --   HGB 11.7* 12.0 11.6* 11.1* 11.6* 11.7*  HCT 37.0 38.0 36.0 36.2 37.6 37.6  MCV 98.4 99.5 98.6 99.5 98.7 98.9  PLT 193 189 210 210 230 233   Basic Metabolic Panel: Recent Labs  Lab 01/31/24 0400 02/01/24 0510 02/02/24 0550 02/03/24 0527 02/04/24 0544  NA 137 136 139 138 138  K 3.7 4.0 3.6 3.7 4.1  CL 99 97* 98 92* 93*  CO2 28 30 33* 33* 35*  GLUCOSE 124* 134* 136* 141* 124*  BUN 14 23 27* 27* 20  CREATININE 0.79 0.66 0.63 0.61 0.54  CALCIUM  9.1 9.0 9.3 9.6 9.6  MG 2.2 1.7 1.7 2.0 2.1   GFR: Estimated Creatinine Clearance: 45.4 mL/min (by C-G formula based on SCr of 0.54 mg/dL). Liver Function Tests: Recent Labs  Lab 01/29/24 1701  AST 33  ALT 8  ALKPHOS 49  BILITOT 1.4*  PROT 6.2*  ALBUMIN 2.6*   No results for input(s): LIPASE, AMYLASE in the last 168  hours. No results for input(s): AMMONIA in the last 168 hours. Coagulation Profile: No results for input(s): INR, PROTIME in the last 168 hours. Cardiac Enzymes: No results for input(s): CKTOTAL, CKMB, CKMBINDEX, TROPONINI in the last 168 hours. BNP (last 3 results) No results for input(s): PROBNP in the last 8760 hours. HbA1C: No results for input(s): HGBA1C in the last 72 hours.  CBG: Recent Labs  Lab 02/02/24 2126 02/03/24 0833 02/03/24 1228 02/03/24 1616 02/03/24 2041  GLUCAP 169* 157* 167* 159* 146*   Lipid Profile: No results for input(s): CHOL, HDL, LDLCALC, TRIG, CHOLHDL, LDLDIRECT in the last 72 hours. Thyroid Function Tests: No results for input(s): TSH, T4TOTAL, FREET4, T3FREE, THYROIDAB in the last 72 hours.  Anemia Panel: No results for input(s): VITAMINB12, FOLATE, FERRITIN, TIBC, IRON , RETICCTPCT in the last 72 hours. Sepsis Labs: Recent Labs  Lab 01/29/24 1924 01/29/24 2120 01/31/24 0400  PROCALCITON  --   --  <0.10  LATICACIDVEN 1.1 1.4  --     Recent Results (from the past 240 hours)  Urine Culture (for pregnant, neutropenic or urologic patients or patients with an indwelling urinary catheter)     Status: Abnormal   Collection Time: 01/29/24  5:01 PM   Specimen: Urine, Clean Catch  Result Value Ref Range Status   Specimen Description   Final    URINE, CLEAN CATCH Performed at [facility], [address] [place], [place] [address]    Special Requests   Final    NONE Performed at [facility], [address], [place], [place] [address]    Culture MULTIPLE SPECIES PRESENT, SUGGEST RECOLLECTION (A)  Final   Report Status 01/31/2024 FINAL  Final  Blood Culture (routine x 2)     Status: None   Collection Time: 01/29/24  7:24 PM   Specimen: BLOOD  Result Value Ref Range Status   Specimen Description BLOOD RIGHT ANTECUBITAL  Final   Special Requests   Final    BOTTLES DRAWN  AEROBIC AND ANAEROBIC Blood Culture results may not be optimal due to an excessive volume of blood received in culture bottles   Culture   Final    NO GROWTH 5 DAYS Performed at [facility], [address] [place], [place] [address]    Report Status 02/03/2024 FINAL  Final  Resp panel by RT-PCR (RSV, Flu A&B, Covid) Anterior Nasal Swab     Status: None   Collection Time: 01/29/24  7:35 PM   Specimen: Anterior Nasal Swab  Result Value Ref Range Status   SARS Coronavirus 2 by RT PCR NEGATIVE NEGATIVE Final    Comment: (NOTE) SARS-CoV-2 target nucleic acids are NOT DETECTED.  The SARS-CoV-2 RNA is generally detectable in upper respiratory specimens  during the acute phase of infection. The lowest concentration of SARS-CoV-2 viral copies this assay can detect is 138 copies/mL. A negative result does not preclude SARS-Cov-2 infection and should not be used as the sole basis for treatment or other patient management decisions. A negative result may occur with  improper specimen collection/handling, submission of specimen other than nasopharyngeal swab, presence of viral mutation(s) within the areas targeted by this assay, and inadequate number of viral copies(<138 copies/mL). A negative result must be combined with clinical observations, patient history, and epidemiological information. The expected result is Negative.  Fact Sheet for Patients:  BloggerCourse.com  Fact Sheet for Healthcare Providers:  SeriousBroker.it  This test is no t yet approved or cleared by the [place]  FDA and  has been authorized for detection and/or diagnosis of SARS-CoV-2 by FDA under an Emergency Use Authorization (EUA). This EUA will remain  in effect (meaning this test can be used) for the duration of the COVID-19 declaration under Section 564(b)(1) of the Act, 21 U.S.C.section 360bbb-3(b)(1), unless the authorization is terminated  or  revoked sooner.       Influenza A by PCR NEGATIVE NEGATIVE Final   Influenza B by PCR NEGATIVE NEGATIVE Final    Comment: (NOTE) The Xpert Xpress SARS-CoV-2/FLU/RSV plus assay is intended as an aid in the diagnosis of influenza from Nasopharyngeal swab specimens and should not be used as a sole basis for treatment. Nasal washings and aspirates are unacceptable for Xpert Xpress SARS-CoV-2/FLU/RSV testing.  Fact Sheet for Patients: BloggerCourse.com  Fact Sheet for Healthcare Providers: SeriousBroker.it  This test is not yet approved or cleared by the [place]  FDA and has been authorized for detection and/or diagnosis of SARS-CoV-2 by FDA under an Emergency Use Authorization (EUA). This EUA will remain in effect (meaning this test can be used) for the duration of the COVID-19 declaration under Section 564(b)(1) of the Act, 21 U.S.C. section 360bbb-3(b)(1), unless the authorization is terminated or revoked.     Resp Syncytial Virus by PCR NEGATIVE NEGATIVE Final    Comment: (NOTE) Fact Sheet for Patients: BloggerCourse.com  Fact Sheet for Healthcare Providers: SeriousBroker.it  This test is not yet approved or cleared by the [place]  FDA and has been authorized for detection and/or diagnosis of SARS-CoV-2 by FDA under an Emergency Use Authorization (EUA). This EUA will remain in effect (meaning this test can be used) for the duration of the COVID-19 declaration under Section 564(b)(1) of the Act, 21 U.S.C. section 360bbb-3(b)(1), unless the authorization is terminated or revoked.  Performed at [facility], [address] [place], [place] [address]   Blood Culture (routine x 2)     Status: None   Collection Time: 01/29/24  9:20 PM   Specimen: BLOOD  Result Value Ref Range Status   Specimen Description BLOOD RIGHT ANTECUBITAL  Final   Special Requests    Final    BOTTLES DRAWN AEROBIC AND ANAEROBIC Blood Culture results may not be optimal due to an excessive volume of blood received in culture bottles   Culture   Final    NO GROWTH 5 DAYS Performed at [facility], [address], [place], [place] [address]    Report Status 02/03/2024 FINAL  Final  Respiratory (~20 pathogens) panel by PCR     Status: None   Collection Time: 01/30/24  9:10 AM   Specimen: Nasopharyngeal Swab; Respiratory  Result Value Ref Range Status   Adenovirus NOT DETECTED NOT DETECTED Final   Coronavirus 229E NOT  DETECTED NOT DETECTED Final    Comment: (NOTE) The Coronavirus on the Respiratory Panel, DOES NOT test for the novel  Coronavirus (2019 nCoV)    Coronavirus HKU1 NOT DETECTED NOT DETECTED Final   Coronavirus NL63 NOT DETECTED NOT DETECTED Final   Coronavirus OC43 NOT DETECTED NOT DETECTED Final   Metapneumovirus NOT DETECTED NOT DETECTED Final   Rhinovirus / Enterovirus NOT DETECTED NOT DETECTED Final   Influenza A NOT DETECTED NOT DETECTED Final   Influenza B NOT DETECTED NOT DETECTED Final   Parainfluenza Virus 1 NOT DETECTED NOT DETECTED Final   Parainfluenza Virus 2 NOT DETECTED NOT DETECTED Final   Parainfluenza Virus 3 NOT DETECTED NOT DETECTED Final   Parainfluenza Virus 4 NOT DETECTED NOT DETECTED Final   Respiratory Syncytial Virus NOT DETECTED NOT DETECTED Final   Bordetella pertussis NOT DETECTED NOT DETECTED Final   Bordetella Parapertussis NOT DETECTED NOT DETECTED Final   Chlamydophila pneumoniae NOT DETECTED NOT DETECTED Final   Mycoplasma pneumoniae NOT DETECTED NOT DETECTED Final    Comment: Performed at [facility], [address], [place], [place] [address]         Radiology Studies: No results found.      Scheduled Meds:  amiodarone   200 mg Oral BID   Followed by   Cecily Cohen ON 02/11/2024] amiodarone   200 mg Oral Daily   apixaban   5 mg Oral BID   busPIRone   10 mg Oral BID   clopidogrel   75 mg Oral  Daily   dapagliflozin propanediol  10 mg Oral Daily   digoxin   0.125 mg Oral Daily   diltiazem   30 mg Oral Q6H   feeding supplement  237 mL Oral BID BM   furosemide   40 mg Intravenous BID   insulin  aspart  0-5 Units Subcutaneous QHS   insulin  aspart  0-9 Units Subcutaneous TID WC   levothyroxine   75 mcg Oral Q0600   metoprolol  succinate  100 mg Oral Daily   mirabegron  ER  25 mg Oral Daily   multivitamin with minerals  1 tablet Oral Daily   pantoprazole   20 mg Oral Daily   PARoxetine   40 mg Oral QPM   rosuvastatin   5 mg Oral Daily   sodium chloride  flush  3 mL Intravenous Q12H   Continuous Infusions:  sodium chloride  20 mL/hr at 02/03/24 1300      LOS: 6 days      Alphonsus Jeans, MD [facility] Pager [phone]  If 7PM-7AM, please contact night-coverage [email or web address] 02/04/2024, 8:47 AM

## 2024-02-04 NOTE — Progress Notes (Signed)
 [facility] Cardiology    SUBJECTIVE: Patient states to be doing better reduced shortness of breath denies any palpitations or tachycardia patient preop for cardioversion with TEE 617   Vitals:   02/04/24 0034 02/04/24 0427 02/04/24 0500 02/04/24 0852  BP: 121/62 126/66  (!) 146/109  Pulse: 80 80  83  Resp: 16 16  18   Temp: 98.4 F (36.9 C) 98.6 F (37 C)  98.6 F (37 C)  TempSrc:  Oral  Oral  SpO2: 91% 94%  94%  Weight:   63.2 kg   Height:         Intake/Output Summary (Last 24 hours) at 02/04/2024 1026 Last data filed at 02/04/2024 1610 Gross per 24 hour  Intake 763 ml  Output 1500 ml  Net -737 ml      PHYSICAL EXAM  General: Well developed, well nourished, in no acute distress HEENT:  Normocephalic and atramatic Neck:  No JVD.  Lungs: Clear bilaterally to auscultation and percussion. Heart: HRRR . Normal S1 and S2 without gallops or murmurs.  Abdomen: Bowel sounds are positive, abdomen soft and non-tender  Msk:  Back normal, normal gait. Normal strength and tone for age. Extremities: No clubbing, cyanosis or edema.   Neuro: Alert and oriented X 3. Psych:  Good affect, responds appropriately   LABS: Basic Metabolic Panel: Recent Labs    02/03/24 0527 02/04/24 0544  NA 138 138  K 3.7 4.1  CL 92* 93*  CO2 33* 35*  GLUCOSE 141* 124*  BUN 27* 20  CREATININE 0.61 0.54  CALCIUM  9.6 9.6  MG 2.0 2.1   Liver Function Tests: No results for input(s): AST, ALT, ALKPHOS, BILITOT, PROT, ALBUMIN in the last 72 hours. No results for input(s): LIPASE, AMYLASE in the last 72 hours. CBC: Recent Labs    02/03/24 0527 02/04/24 0544  WBC 8.5 9.9  HGB 11.6* 11.7*  HCT 37.6 37.6  MCV 98.7 98.9  PLT 230 233   Cardiac Enzymes: No results for input(s): CKTOTAL, CKMB, CKMBINDEX, TROPONINI in the last 72 hours. BNP: Invalid input(s): POCBNP D-Dimer: No results for input(s): DDIMER in the last 72 hours. Hemoglobin A1C: No results for input(s):  HGBA1C in the last 72 hours. Fasting Lipid Panel: No results for input(s): CHOL, HDL, LDLCALC, TRIG, CHOLHDL, LDLDIRECT in the last 72 hours. Thyroid Function Tests: No results for input(s): TSH, T4TOTAL, T3FREE, THYROIDAB in the last 72 hours.  Invalid input(s): FREET3 Anemia Panel: No results for input(s): VITAMINB12, FOLATE, FERRITIN, TIBC, IRON , RETICCTPCT in the last 72 hours.  No results found.   Echo preserved left ventricular function  TELEMETRY: Atrial fibrillation rate of 67:  ASSESSMENT AND PLAN:  Principal Problem:   CHF (congestive heart failure) (HCC) Active Problems:   Diabetes mellitus without complication (HCC)   Hypothyroidism   Asthma   Coronary artery disease involving native coronary artery of native heart   Renal artery stenosis (HCC)   Hypertension, renovascular   A-fib (HCC)   Malnutrition of moderate degree    Plan Atrial fibrillation rate controlled continue anticoagulation with Eliquis  5 mg twice a day patient also on digoxin  and amiodarone  diltiazem  as needed for rate control and now on metoprolol  Consider electrical cardioversion on 617 TEE cardioversion with Dr. Bob Burn Acute on chronic diastolic congestive heart failure continue current therapy beta-blocker Farxiga Lasix  consider spironolactone Continue Plavix  75 mg daily Continue current management    Antonette Batters, MD, 02/04/2024 10:26 AM

## 2024-02-05 DIAGNOSIS — I480 Paroxysmal atrial fibrillation: Secondary | ICD-10-CM | POA: Diagnosis not present

## 2024-02-05 LAB — GLUCOSE, CAPILLARY
Glucose-Capillary: 148 mg/dL — ABNORMAL HIGH (ref 70–99)
Glucose-Capillary: 196 mg/dL — ABNORMAL HIGH (ref 70–99)

## 2024-02-05 LAB — BASIC METABOLIC PANEL WITH GFR
Anion gap: 9 (ref 5–15)
BUN: 18 mg/dL (ref 8–23)
CO2: 39 mmol/L — ABNORMAL HIGH (ref 22–32)
Calcium: 9.6 mg/dL (ref 8.9–10.3)
Chloride: 89 mmol/L — ABNORMAL LOW (ref 98–111)
Creatinine, Ser: 0.63 mg/dL (ref 0.44–1.00)
GFR, Estimated: >60 mL/min (ref >60)
Glucose, Bld: 128 mg/dL — ABNORMAL HIGH (ref 70–99)
Potassium: 4.1 mmol/L (ref 3.5–5.1)
Sodium: 137 mmol/L (ref 135–145)

## 2024-02-05 LAB — MAGNESIUM: Magnesium: 2 mg/dL (ref 1.7–2.4)

## 2024-02-05 MED ORDER — GUAIFENESIN-DM 100-10 MG/5ML PO SYRP
5.0000 mL | ORAL_SOLUTION | ORAL | Status: DC | PRN
  Administered 2024-02-05: 5 mL via ORAL
  Filled 2024-02-05: qty 10

## 2024-02-05 MED ORDER — FLUCONAZOLE 50 MG PO TABS
150.0000 mg | ORAL_TABLET | Freq: Every day | ORAL | Status: AC
  Administered 2024-02-05 – 2024-02-06 (×2): 150 mg via ORAL
  Filled 2024-02-05 (×2): qty 1

## 2024-02-05 MED ORDER — NYSTATIN-TRIAMCINOLONE 100000-0.1 UNIT/GM-% EX CREA
1.0000 | TOPICAL_CREAM | Freq: Two times a day (BID) | CUTANEOUS | Status: DC
  Administered 2024-02-05 – 2024-02-13 (×13): 1 via TOPICAL
  Filled 2024-02-05 (×3): qty 15

## 2024-02-05 MED ORDER — NYSTATIN-TRIAMCINOLONE 100000-0.1 UNIT/GM-% EX OINT: TOPICAL_OINTMENT | Freq: Two times a day (BID) | CUTANEOUS | Status: DC

## 2024-02-05 NOTE — Progress Notes (Signed)
 [facility] Cardiology    SUBJECTIVE: Patient still having some shortness of breath somewhat improved but still has some significant dyspnea still feels somewhat congested no significant leg edema   Vitals:   02/05/24 0005 02/05/24 0427 02/05/24 0428 02/05/24 0500  BP: (!) 141/72  (!) 148/75   Pulse: (!) 48  94   Resp: 19  15   Temp: 98.5 F (36.9 C) 98.2 F (36.8 C) 98.2 F (36.8 C)   TempSrc: Oral Oral Oral   SpO2: 96%  98%   Weight:    62.1 kg  Height:         Intake/Output Summary (Last 24 hours) at 02/05/2024 0981 Last data filed at 02/05/2024 0433 Gross per 24 hour  Intake 603 ml  Output 2375 ml  Net -1772 ml      PHYSICAL EXAM  General: Well developed, well nourished, in no acute distress HEENT:  Normocephalic and atramatic Neck:  No JVD.  Lungs: Dullness in the bases with crackles bilaterally to auscultation and percussion. Heart: Irregular irregular. Normal S1 and S2 without gallops or murmurs.  Abdomen: Bowel sounds are positive, abdomen soft and non-tender  Msk:  Back normal, normal gait. Normal strength and tone for age. Extremities: No clubbing, cyanosis or edema.   Neuro: Alert and oriented X 3. Psych:  Good affect, responds appropriately   LABS: Basic Metabolic Panel: Recent Labs    02/04/24 0544 02/05/24 0440  NA 138 137  K 4.1 4.1  CL 93* 89*  CO2 35* 39*  GLUCOSE 124* 128*  BUN 20 18  CREATININE 0.54 0.63  CALCIUM  9.6 9.6  MG 2.1 2.0   Liver Function Tests: No results for input(s): AST, ALT, ALKPHOS, BILITOT, PROT, ALBUMIN in the last 72 hours. No results for input(s): LIPASE, AMYLASE in the last 72 hours. CBC: Recent Labs    02/03/24 0527 02/04/24 0544  WBC 8.5 9.9  HGB 11.6* 11.7*  HCT 37.6 37.6  MCV 98.7 98.9  PLT 230 233   Cardiac Enzymes: No results for input(s): CKTOTAL, CKMB, CKMBINDEX, TROPONINI in the last 72 hours. BNP: Invalid input(s): POCBNP D-Dimer: No results for input(s): DDIMER in the  last 72 hours. Hemoglobin A1C: No results for input(s): HGBA1C in the last 72 hours. Fasting Lipid Panel: No results for input(s): CHOL, HDL, LDLCALC, TRIG, CHOLHDL, LDLDIRECT in the last 72 hours. Thyroid Function Tests: No results for input(s): TSH, T4TOTAL, T3FREE, THYROIDAB in the last 72 hours.  Invalid input(s): FREET3 Anemia Panel: No results for input(s): VITAMINB12, FOLATE, FERRITIN, TIBC, IRON , RETICCTPCT in the last 72 hours.  No results found.   Echo preserved left ventricular function EF of at least 55 to 60%  TELEMETRY: Atrial fibrillation rate of 55 nonspecific ST-T wave changes:  ASSESSMENT AND PLAN:  Principal Problem:   CHF (congestive heart failure) (HCC) Active Problems:   Diabetes mellitus without complication (HCC)   Hypothyroidism   Asthma   Coronary artery disease involving native coronary artery of native heart   Renal artery stenosis (HCC)   Hypertension, renovascular   A-fib (HCC)   Malnutrition of moderate degree    Plan Acute congestive heart failure diastolic dysfunction recommend aggressive heart failure therapy consider spironolactone consider SGLT 2 continue diuretics Atrial fibrillation paroxysmal continue metoprolol  amiodarone  digoxin  diltiazem  only for rate control Recommend anticoagulation for atrial fibrillation transition from heparin  to Eliquis  Hypertension management and control currently on Coreg  Continue IV diuretic therapy for diuresis and heart failure Crestor  therapy for hyperlipidemia management Physical therapy for  strength training ambulation balance Outpatient follow-up with heart failure clinic as an outpatient at [facility]   Tomothy Eddins D Medard Decuir, MD, 02/05/2024 7:33 AM

## 2024-02-05 NOTE — Progress Notes (Signed)
 During vital signs at 3:45pm patient asked RN to request fluconazole and cream from provider due to vaginal symptoms. At 4pm I secure chatted Dr. Broadus Canes the request. At 4:07pm Dr Broadus Canes ordered medicine. Medication was verified by pharmacy at 4:25pm.   Patient called nursing station at 4:55pm requesting to leave AMA due to medication request taking too long. She stated that she saw her primary nurse(me) across the hall working with another patient and she should have been prioritized better.  She is alert, oriented x4 and able to make her own decisions.  I apologized to the patient about the delay and explained the process of medication orders to her, reassured her that I would be actively looking for the medicine to be delivered. I also explained to her that she is receiving IV lasix  and has required oxygen today, leaving would be against the advice of her medical doctor. Dr. Broadus Canes notified.  The patient has agreed to stay tonight because she does not have anybody to come and get her. She request to leave in the morning.

## 2024-02-05 NOTE — Progress Notes (Signed)
 PROGRESS NOTE    Mary [name]  ZOX:[id number] DOB: 10-31-1934 DOA: 01/29/2024 PCP: Monique Ano, MD   Assessment & Plan:   Principal Problem:   CHF (congestive heart failure) (HCC) Active Problems:   Hypothyroidism   Diabetes mellitus without complication (HCC)   Asthma   Coronary artery disease involving native coronary artery of native heart   Renal artery stenosis (HCC)   Hypertension, renovascular   A-fib (HCC)   Malnutrition of moderate degree  Assessment and Plan: A-fib: with RVR. Continue on amio, metoprolol , diltiazem , digoxin  as per cardio. Continue on tele. Possible cardioversion on 02/07/24 as per cardio & cardioversion canceled on 02/03/24 b/c pt ate breakfast. Cardio following and recs apprec   Chronic diastolic CHF: continue on lasix . Monitor I/Os. No CP currently    Pulmonary infiltrates: likely pneumonia. Completed abx course. Bronchodilators prn. Encourage incentive spirometry    Acute hypoxic respiratory failure: continue on supplemental oxygen and wean as tolerated. Unable to wean from supplemental oxygen so far and will possibly have to d/c home with oxygen    Hypokalemia: WNL today    History recurrent UTIs: urine cx growing multiple species. C/o dysuria. ? acute UTI. Continue on IV rocephin    Hypothyroidism: continue on home dose of levothyroxine     DM2: fair control, HbA1c 7.0. Continue on SSI w/ accuchecks   HTN: continue on coreg    HLD: continue on statin   GAD: severity unknown. Continue on home dose of paroxetine    Debility: PT/OT recs HH       DVT prophylaxis: eliquis  Code Status: full  Family Communication: Disposition Plan: likely d/c back home   Level of care: Progressive  Status is: Inpatient Remains inpatient appropriate because: severity of illness    Consultants:  Cardio   Procedures:   Antimicrobials:    Subjective: Pt c/o fatigue   Objective: Vitals:   02/05/24 0427 02/05/24 0428 02/05/24 0500 02/05/24  0809  BP:  (!) 148/75  138/62  Pulse:  94  (!) 52  Resp:  15  (!) 22  Temp: 98.2 F (36.8 C) 98.2 F (36.8 C)  98.3 F (36.8 C)  TempSrc: Oral Oral  Oral  SpO2:  98%  94%  Weight:   62.1 kg   Height:        Intake/Output Summary (Last 24 hours) at 02/05/2024 0832 Last data filed at 02/05/2024 0433 Gross per 24 hour  Intake 603 ml  Output 2375 ml  Net -1772 ml   Filed Weights   02/03/24 0500 02/04/24 0500 02/05/24 0500  Weight: 63.3 kg 63.2 kg 62.1 kg    Examination:  General exam: appears calm & comfortable  Respiratory system: diminished breath sounds b/l  Cardiovascular system: irregularly irregular  Gastrointestinal system: abd is soft, NT, ND & hypoactive bowel sounds  Central nervous system: alert & awake. Moves all extremities   Psychiatry: Judgement and insight appears at baseline. Flat mood and affect    Data Reviewed: I have personally reviewed following labs and imaging studies  CBC: Recent Labs  Lab 01/29/24 1701 01/31/24 0400 02/01/24 0510 02/02/24 0550 02/03/24 0527 02/04/24 0544  WBC 7.3 6.1 8.8 7.4 8.5 9.9  NEUTROABS 5.2  --   --   --   --   --   HGB 11.7* 12.0 11.6* 11.1* 11.6* 11.7*  HCT 37.0 38.0 36.0 36.2 37.6 37.6  MCV 98.4 99.5 98.6 99.5 98.7 98.9  PLT 193 189 210 210 230 233   Basic Metabolic Panel: Recent Labs  Lab 02/01/24 0510 02/02/24 0550 02/03/24 0527 02/04/24 0544 02/05/24 0440  NA 136 139 138 138 137  K 4.0 3.6 3.7 4.1 4.1  CL 97* 98 92* 93* 89*  CO2 30 33* 33* 35* 39*  GLUCOSE 134* 136* 141* 124* 128*  BUN 23 27* 27* 20 18  CREATININE 0.66 0.63 0.61 0.54 0.63  CALCIUM  9.0 9.3 9.6 9.6 9.6  MG 1.7 1.7 2.0 2.1 2.0   GFR: Estimated Creatinine Clearance: 45.4 mL/min (by C-G formula based on SCr of 0.63 mg/dL). Liver Function Tests: Recent Labs  Lab 01/29/24 1701  AST 33  ALT 8  ALKPHOS 49  BILITOT 1.4*  PROT 6.2*  ALBUMIN 2.6*   No results for input(s): LIPASE, AMYLASE in the last 168 hours. No results  for input(s): AMMONIA in the last 168 hours. Coagulation Profile: No results for input(s): INR, PROTIME in the last 168 hours. Cardiac Enzymes: No results for input(s): CKTOTAL, CKMB, CKMBINDEX, TROPONINI in the last 168 hours. BNP (last 3 results) No results for input(s): PROBNP in the last 8760 hours. HbA1C: No results for input(s): HGBA1C in the last 72 hours.  CBG: Recent Labs  Lab 02/03/24 1616 02/03/24 2041 02/04/24 1242 02/04/24 1705 02/04/24 2027  GLUCAP 159* 146* 214* 114* 186*   Lipid Profile: No results for input(s): CHOL, HDL, LDLCALC, TRIG, CHOLHDL, LDLDIRECT in the last 72 hours. Thyroid Function Tests: No results for input(s): TSH, T4TOTAL, FREET4, T3FREE, THYROIDAB in the last 72 hours.  Anemia Panel: No results for input(s): VITAMINB12, FOLATE, FERRITIN, TIBC, IRON , RETICCTPCT in the last 72 hours. Sepsis Labs: Recent Labs  Lab 01/29/24 1924 01/29/24 2120 01/31/24 0400  PROCALCITON  --   --  <0.10  LATICACIDVEN 1.1 1.4  --     Recent Results (from the past 240 hours)  Urine Culture (for pregnant, neutropenic or urologic patients or patients with an indwelling urinary catheter)     Status: Abnormal   Collection Time: 01/29/24  5:01 PM   Specimen: Urine, Clean Catch  Result Value Ref Range Status   Specimen Description   Final    URINE, CLEAN CATCH Performed at [facility], [address] [place], [place] [address]    Special Requests   Final    NONE Performed at [facility], [address] [place], [place] [address]    Culture MULTIPLE SPECIES PRESENT, SUGGEST RECOLLECTION (A)  Final   Report Status 01/31/2024 FINAL  Final  Blood Culture (routine x 2)     Status: None   Collection Time: 01/29/24  7:24 PM   Specimen: BLOOD  Result Value Ref Range Status   Specimen Description BLOOD RIGHT ANTECUBITAL  Final   Special Requests   Final    BOTTLES DRAWN AEROBIC AND  ANAEROBIC Blood Culture results may not be optimal due to an excessive volume of blood received in culture bottles   Culture   Final    NO GROWTH 5 DAYS Performed at [facility], [address] [place], [place] [address]    Report Status 02/03/2024 FINAL  Final  Resp panel by RT-PCR (RSV, Flu A&B, Covid) Anterior Nasal Swab     Status: None   Collection Time: 01/29/24  7:35 PM   Specimen: Anterior Nasal Swab  Result Value Ref Range Status   SARS Coronavirus 2 by RT PCR NEGATIVE NEGATIVE Final    Comment: (NOTE) SARS-CoV-2 target nucleic acids are NOT DETECTED.  The SARS-CoV-2 RNA is generally detectable in upper respiratory specimens  during the acute phase of infection. The lowest concentration of SARS-CoV-2 viral copies this assay can detect is 138 copies/mL. A negative result does not preclude SARS-Cov-2 infection and should not be used as the sole basis for treatment or other patient management decisions. A negative result may occur with  improper specimen collection/handling, submission of specimen other than nasopharyngeal swab, presence of viral mutation(s) within the areas targeted by this assay, and inadequate number of viral copies(<138 copies/mL). A negative result must be combined with clinical observations, patient history, and epidemiological information. The expected result is Negative.  Fact Sheet for Patients:  BloggerCourse.com  Fact Sheet for Healthcare Providers:  SeriousBroker.it  This test is no t yet approved or cleared by the [place]  FDA and  has been authorized for detection and/or diagnosis of SARS-CoV-2 by FDA under an Emergency Use Authorization (EUA). This EUA will remain  in effect (meaning this test can be used) for the duration of the COVID-19 declaration under Section 564(b)(1) of the Act, 21 U.S.C.section 360bbb-3(b)(1), unless the authorization is terminated  or revoked sooner.        Influenza A by PCR NEGATIVE NEGATIVE Final   Influenza B by PCR NEGATIVE NEGATIVE Final    Comment: (NOTE) The Xpert Xpress SARS-CoV-2/FLU/RSV plus assay is intended as an aid in the diagnosis of influenza from Nasopharyngeal swab specimens and should not be used as a sole basis for treatment. Nasal washings and aspirates are unacceptable for Xpert Xpress SARS-CoV-2/FLU/RSV testing.  Fact Sheet for Patients: BloggerCourse.com  Fact Sheet for Healthcare Providers: SeriousBroker.it  This test is not yet approved or cleared by the [place]  FDA and has been authorized for detection and/or diagnosis of SARS-CoV-2 by FDA under an Emergency Use Authorization (EUA). This EUA will remain in effect (meaning this test can be used) for the duration of the COVID-19 declaration under Section 564(b)(1) of the Act, 21 U.S.C. section 360bbb-3(b)(1), unless the authorization is terminated or revoked.     Resp Syncytial Virus by PCR NEGATIVE NEGATIVE Final    Comment: (NOTE) Fact Sheet for Patients: BloggerCourse.com  Fact Sheet for Healthcare Providers: SeriousBroker.it  This test is not yet approved or cleared by the [place]  FDA and has been authorized for detection and/or diagnosis of SARS-CoV-2 by FDA under an Emergency Use Authorization (EUA). This EUA will remain in effect (meaning this test can be used) for the duration of the COVID-19 declaration under Section 564(b)(1) of the Act, 21 U.S.C. section 360bbb-3(b)(1), unless the authorization is terminated or revoked.  Performed at [facility], [address] [place], [place] [address]   Blood Culture (routine x 2)     Status: None   Collection Time: 01/29/24  9:20 PM   Specimen: BLOOD  Result Value Ref Range Status   Specimen Description BLOOD RIGHT ANTECUBITAL  Final   Special Requests   Final     BOTTLES DRAWN AEROBIC AND ANAEROBIC Blood Culture results may not be optimal due to an excessive volume of blood received in culture bottles   Culture   Final    NO GROWTH 5 DAYS Performed at [facility], [address] [place], [place] [address]    Report Status 02/03/2024 FINAL  Final  Respiratory (~20 pathogens) panel by PCR     Status: None   Collection Time: 01/30/24  9:10 AM   Specimen: Nasopharyngeal Swab; Respiratory  Result Value Ref Range Status   Adenovirus NOT DETECTED NOT DETECTED Final   Coronavirus 229E NOT  DETECTED NOT DETECTED Final    Comment: (NOTE) The Coronavirus on the Respiratory Panel, DOES NOT test for the novel  Coronavirus (2019 nCoV)    Coronavirus HKU1 NOT DETECTED NOT DETECTED Final   Coronavirus NL63 NOT DETECTED NOT DETECTED Final   Coronavirus OC43 NOT DETECTED NOT DETECTED Final   Metapneumovirus NOT DETECTED NOT DETECTED Final   Rhinovirus / Enterovirus NOT DETECTED NOT DETECTED Final   Influenza A NOT DETECTED NOT DETECTED Final   Influenza B NOT DETECTED NOT DETECTED Final   Parainfluenza Virus 1 NOT DETECTED NOT DETECTED Final   Parainfluenza Virus 2 NOT DETECTED NOT DETECTED Final   Parainfluenza Virus 3 NOT DETECTED NOT DETECTED Final   Parainfluenza Virus 4 NOT DETECTED NOT DETECTED Final   Respiratory Syncytial Virus NOT DETECTED NOT DETECTED Final   Bordetella pertussis NOT DETECTED NOT DETECTED Final   Bordetella Parapertussis NOT DETECTED NOT DETECTED Final   Chlamydophila pneumoniae NOT DETECTED NOT DETECTED Final   Mycoplasma pneumoniae NOT DETECTED NOT DETECTED Final    Comment: Performed at [facility], [address], [place], [place] [address]         Radiology Studies: No results found.      Scheduled Meds:  amiodarone   200 mg Oral BID   Followed by   Cecily Cohen ON 02/11/2024] amiodarone   200 mg Oral Daily   apixaban   5 mg Oral BID   busPIRone   10 mg Oral BID   clopidogrel   75 mg Oral Daily    dapagliflozin propanediol  10 mg Oral Daily   digoxin   0.125 mg Oral Daily   diltiazem   30 mg Oral Q6H   feeding supplement  237 mL Oral BID BM   furosemide   40 mg Intravenous BID   insulin  aspart  0-5 Units Subcutaneous QHS   insulin  aspart  0-9 Units Subcutaneous TID WC   levothyroxine   75 mcg Oral Q0600   metoprolol  succinate  100 mg Oral Daily   mirabegron  ER  25 mg Oral Daily   multivitamin with minerals  1 tablet Oral Daily   pantoprazole   20 mg Oral Daily   PARoxetine   40 mg Oral QPM   rosuvastatin   5 mg Oral Daily   sodium chloride  flush  3 mL Intravenous Q12H   Continuous Infusions:      LOS: 7 days      Alphonsus Jeans, MD [facility] Pager [phone]  If 7PM-7AM, please contact night-coverage [email or web address] 02/05/2024, 8:32 AM

## 2024-02-06 DIAGNOSIS — I480 Paroxysmal atrial fibrillation: Secondary | ICD-10-CM | POA: Diagnosis not present

## 2024-02-06 LAB — BASIC METABOLIC PANEL WITH GFR
Anion gap: 10 (ref 5–15)
BUN: 16 mg/dL (ref 8–23)
CO2: 41 mmol/L — ABNORMAL HIGH (ref 22–32)
Calcium: 9.4 mg/dL (ref 8.9–10.3)
Chloride: 88 mmol/L — ABNORMAL LOW (ref 98–111)
Creatinine, Ser: 0.52 mg/dL (ref 0.44–1.00)
GFR, Estimated: >60 mL/min (ref >60)
Glucose, Bld: 111 mg/dL — ABNORMAL HIGH (ref 70–99)
Potassium: 3.2 mmol/L — ABNORMAL LOW (ref 3.5–5.1)
Sodium: 139 mmol/L (ref 135–145)

## 2024-02-06 LAB — GLUCOSE, CAPILLARY
Glucose-Capillary: 144 mg/dL — ABNORMAL HIGH (ref 70–99)
Glucose-Capillary: 123 mg/dL — ABNORMAL HIGH (ref 70–99)
Glucose-Capillary: 211 mg/dL — ABNORMAL HIGH (ref 70–99)
Glucose-Capillary: 161 mg/dL — ABNORMAL HIGH (ref 70–99)
Glucose-Capillary: 128 mg/dL — ABNORMAL HIGH (ref 70–99)

## 2024-02-06 LAB — DIGOXIN LEVEL: Digoxin Level: 1.4 ng/mL (ref 0.8–2.0)

## 2024-02-06 LAB — MAGNESIUM: Magnesium: 1.9 mg/dL (ref 1.7–2.4)

## 2024-02-06 MED ORDER — LOSARTAN POTASSIUM 25 MG PO TABS
25.0000 mg | ORAL_TABLET | Freq: Every day | ORAL | Status: DC
  Administered 2024-02-06 – 2024-02-07 (×2): 25 mg via ORAL
  Filled 2024-02-06 (×2): qty 1

## 2024-02-06 MED ORDER — POTASSIUM CHLORIDE CRYS ER 20 MEQ PO TBCR
40.0000 meq | EXTENDED_RELEASE_TABLET | Freq: Two times a day (BID) | ORAL | Status: AC
  Administered 2024-02-06 (×2): 40 meq via ORAL
  Filled 2024-02-06 (×2): qty 2

## 2024-02-06 NOTE — Progress Notes (Signed)
 Spoke with patients family member, Estha Few via telephone. Questions asked and answered re: cardioversion on 6/17. Ms Tischer was satisfied and stated she had no more questions.

## 2024-02-06 NOTE — Progress Notes (Signed)
 [facility] CLINIC CARDIOLOGY PROGRESS NOTE       [name] MRN: [id number] DOB/AGE: 87-06-1935 87 y.o.  Admit date: 01/29/2024 Referring Physician Dr. Hines Ludwig Primary Physician Monique Ano, MD Primary Cardiologist Dr. Larinda Plover Reason for Consultation AF RVR  HPI: Mary [name] is a 89 y.o. female  with a past medical history of paroxsymal atrial fibrillation (on Eliquis ), chronic HFpEF, coronary artery disease (on CT), hypertension, hyperlipidemia, T2DM, right carotid stenosis s/p CEA, pulmonary hypertension, COPD/asthma who presented to the ED on 01/29/2024 for shortness of breath, palpitations and lower abdominal pain. EKG in ED revealed AF RVR with rates 160s. Cardiology was consulted for further evaluation.   Interval History: -Patient seen and examined this AM and sitting in bedside chair. Patient states she continues to feel better and states SOB continues to improve, however not at baseline. Denies chest pain or palpitations.   -Patients BP stable and HR improving.  -Per tele remains in AF with improved rates 70-80s this AM. -UOP yesterday 1L with stable renal function.   -Patient remains on 2L [place] with stable SpO2.  -Plan for TEE/DCCV with Dr. Bob Burn tomorrow at 12PM. Patient is agreeable. NPO at midnight.    Review of systems complete and found to be negative unless listed above    Past Medical History:  Diagnosis Date   Acid reflux    Anemia    Arthritis    Asthma    Atherosclerosis of abdominal aorta (HCC)    B12 deficiency    Clostridium difficile infection    H/O   Coronary artery disease    DDD (degenerative disc disease), lumbar    Depression    Diabetes (HCC)    type 2   Diabetic retinopathy (HCC)    Dysrhythmia    Heart murmur    HLD (hyperlipidemia)    HTN (hypertension)    Hypotension    when get up too quickly   Hypothyroidism    Pneumonia    PONV (postoperative nausea and vomiting)    Pulmonary nodule    right middle lobe on  CT scan   Pure hypercholesterolemia    RA (rheumatoid arthritis) (HCC)    Sleep apnea    Spinal stenosis    Urinary urgency     Past Surgical History:  Procedure Laterality Date   ABDOMINAL HYSTERECTOMY     CATARACT EXTRACTION, BILATERAL     COLONOSCOPY N/A 07/29/2021   Procedure: COLONOSCOPY;  Surgeon: Toledo, Alphonsus Jeans, MD;  Location: [facility] ENDOSCOPY;  Service: Gastroenterology;  Laterality: N/A;   CYSTOSCOPY     ENDARTERECTOMY Right 12/02/2021   Procedure: ENDARTERECTOMY CAROTID;  Surgeon: Celso College, MD;  Location: [facility] ORS;  Service: Vascular;  Laterality: Right;   ESOPHAGOGASTRODUODENOSCOPY N/A 07/29/2021   Procedure: ESOPHAGOGASTRODUODENOSCOPY (EGD);  Surgeon: Toledo, Alphonsus Jeans, MD;  Location: [facility] ENDOSCOPY;  Service: Gastroenterology;  Laterality: N/A;  DM   HIP ARTHROPLASTY Right 09/24/2015   Procedure: ARTHROPLASTY BIPOLAR HIP (HEMIARTHROPLASTY);  Surgeon: Elner Hahn, MD;  Location: [facility] ORS;  Service: Orthopedics;  Laterality: Right;   HIP ARTHROPLASTY Left 09/29/2018   Procedure: ARTHROPLASTY BIPOLAR HIP (HEMIARTHROPLASTY) LEFT;  Surgeon: Elner Hahn, MD;  Location: [facility] ORS;  Service: Orthopedics;  Laterality: Left;   KNEE ARTHROSCOPY W/ AUTOGENOUS CARTILAGE IMPLANTATION (ACI) PROCEDURE     TOTAL KNEE ARTHROPLASTY Left 12/01/2017   Procedure: TOTAL KNEE ARTHROPLASTY;  Surgeon: Elner Hahn, MD;  Location: [facility] ORS;  Service: Orthopedics;  Laterality: Left;   TOTAL KNEE  ARTHROPLASTY Right 2014   VARICOSE VEIN SURGERY Left    leg    Medications Prior to Admission  Medication Sig Dispense Refill Last Dose/Taking   acetaminophen  (TYLENOL ) 500 MG tablet Take 500 mg by mouth every 6 (six) hours as needed for mild pain (pain score 1-3).   Taking As Needed   azelastine (OPTIVAR) 0.05 % ophthalmic solution Place 1 drop into both eyes daily as needed (allergies).   Taking As Needed   busPIRone  (BUSPAR ) 10 MG tablet Take 10 mg by mouth 2 (two) times daily.   01/29/2024    carvedilol  (COREG ) 6.25 MG tablet Take 6.25 mg by mouth 2 (two) times daily with a meal.   01/29/2024   cyclobenzaprine  (FLEXERIL ) 5 MG tablet Take 1 tablet (5 mg total) by mouth 3 (three) times daily as needed for muscle spasms.   Taking As Needed   furosemide  (LASIX ) 40 MG tablet Take 1 tablet (40 mg total) by mouth daily.   Past Week   Iron -Vitamin C  (VITRON-C) 65-125 MG TABS Take 1 tablet by mouth daily. 90 tablet 1 Past Week   levothyroxine  (SYNTHROID , LEVOTHROID) 75 MCG tablet Take 75 mcg by mouth daily.    01/29/2024   losartan  (COZAAR ) 25 MG tablet Take 1 tablet by mouth daily.   Taking   metFORMIN  (GLUCOPHAGE -XR) 500 MG 24 hr tablet Take 500 mg by mouth daily. With dinner   01/29/2024   pantoprazole  (PROTONIX ) 20 MG tablet Take 20 mg by mouth daily.   01/29/2024   PARoxetine  (PAXIL ) 40 MG tablet Take 40 mg by mouth every evening.   01/29/2024   potassium chloride  SA (KLOR-CON  M) 20 MEQ tablet Take 20 mEq by mouth 2 (two) times daily.  Take 1 tablet (20 mEq total) by mouth 2 (two) times daily With lasix    01/29/2024   rosuvastatin  (CRESTOR ) 5 MG tablet Take 1 tablet (5 mg total) by mouth daily. 30 tablet 0 Past Week   vitamin B-12 (CYANOCOBALAMIN ) 500 MCG tablet Take 500 mcg by mouth every other day.   01/29/2024   apixaban  (ELIQUIS ) 5 MG TABS tablet Take 1 tablet (5 mg total) by mouth 2 (two) times daily. (Patient not taking: Reported on 01/30/2024)   Not Taking   carvedilol  (COREG ) 25 MG tablet Take 2 tablets (50 mg total) by mouth 2 (two) times daily with a meal. (Patient not taking: Reported on 01/30/2024)   Not Taking   carvedilol  (COREG ) 25 MG tablet Take 25 mg by mouth 2 (two) times daily with a meal. (Patient not taking: Reported on 01/30/2024)   Not Taking   clopidogrel  (PLAVIX ) 75 MG tablet Take 75 mg by mouth daily. (Patient not taking: Reported on 01/30/2024)   Not Taking   digoxin  (LANOXIN ) 0.125 MG tablet Take 1 tablet (0.125 mg total) by mouth daily. (Patient not taking: Reported on 01/30/2024)   Not  Taking   irbesartan  (AVAPRO ) 150 MG tablet Take 1 tablet (150 mg total) by mouth daily. (Patient not taking: Reported on 01/30/2024)   Not Taking   traZODone  (DESYREL ) 50 MG tablet Take 0.5 tablets (25 mg total) by mouth at bedtime as needed for sleep. (Patient not taking: Reported on 01/30/2024)   Not Taking   Vibegron  (GEMTESA ) 75 MG TABS Take 1 tablet (75 mg total) by mouth daily at 6 (six) AM. (Patient not taking: Reported on 01/30/2024)   Not Taking   Social History   Socioeconomic History   Marital status: Widowed    Spouse name: Not  on file   Number of children: Not on file   Years of education: Not on file   Highest education level: Not on file  Occupational History   Occupation: retired  Tobacco Use   Smoking status: Never   Smokeless tobacco: Never  Vaping Use   Vaping status: Never Used  Substance and Sexual Activity   Alcohol  use: No    Alcohol /week: 0.0 standard drinks of alcohol    Drug use: No   Sexual activity: Not Currently  Other Topics Concern   Not on file  Social History Narrative   Lives with daughter   Social Drivers of Health   Financial Resource Strain: Low Risk  (09/28/2023)   Received from [facility]   Overall Financial Resource Strain (CARDIA)    Difficulty of Paying Living Expenses: Not hard at all  Food Insecurity: No Food Insecurity (01/30/2024)   Hunger Vital Sign    Worried About Running Out of Food in the Last Year: Never true    Ran Out of Food in the Last Year: Never true  Transportation Needs: No Transportation Needs (01/30/2024)   PRAPARE - Administrator, Civil Service (Medical): No    Lack of Transportation (Non-Medical): No  Physical Activity: Not on file  Stress: Not on file  Social Connections: Moderately Isolated (01/30/2024)   Social Connection and Isolation Panel    Frequency of Communication with Friends and Family: More than three times a week    Frequency of Social Gatherings with Friends and Family: More  than three times a week    Attends Religious Services: 1 to 4 times per year    Active Member of [employer] or Organizations: No    Attends Banker Meetings: Never    Marital Status: Widowed  Intimate Partner Violence: Not At Risk (01/30/2024)   Humiliation, Afraid, Rape, and Kick questionnaire    Fear of Current or Ex-Partner: No    Emotionally Abused: No    Physically Abused: No    Sexually Abused: No    Family History  Problem Relation Age of Onset   Kidney disease Brother        also nephew   Heart disease Mother    Heart disease Father    Prostate cancer Neg Hx    Bladder Cancer Neg Hx    Breast cancer Neg Hx    Kidney cancer Neg Hx      Vitals:   02/06/24 0500 02/06/24 0600 02/06/24 0700 02/06/24 0800  BP:    (!) 168/65  Pulse:  (!) 59 62 75  Resp:  19 20 (!) 23  Temp:      TempSrc:      SpO2:  94% 92% 95%  Weight: 63.2 kg     Height:        PHYSICAL EXAM General: well appearing elderly female, well nourished, in no acute distress. HEENT: Normocephalic and atraumatic. Neck: No JVD.   Lungs: Normal respiratory effort on 2L [place]. Diminished breath sounds bilaterally.  Heart: Irregularly, irregular, rate controlled. Normal S1 and S2 without gallops or murmurs.  Abdomen: Non-distended appearing.  Msk: Normal strength and tone for age. Extremities: Warm and well perfused. No clubbing, cyanosis. No edema.  Neuro: Alert and oriented X 3. Psych: Answers questions appropriately.   Labs: Basic Metabolic Panel: Recent Labs    02/05/24 0440 02/06/24 0403  NA 137 139  K 4.1 3.2*  CL 89* 88*  CO2 39* 41*  GLUCOSE 128*  111*  BUN 18 16  CREATININE 0.63 0.52  CALCIUM  9.6 9.4  MG 2.0 1.9   Liver Function Tests: No results for input(s): AST, ALT, ALKPHOS, BILITOT, PROT, ALBUMIN in the last 72 hours.  No results for input(s): LIPASE, AMYLASE in the last 72 hours. CBC: Recent Labs    02/04/24 0544  WBC 9.9  HGB 11.7*  HCT 37.6  MCV 98.9   PLT 233   Cardiac Enzymes: No results for input(s): CKTOTAL, CKMB, CKMBINDEX, TROPONINIHS in the last 72 hours.  BNP: No results for input(s): BNP in the last 72 hours.  D-Dimer: No results for input(s): DDIMER in the last 72 hours. Hemoglobin A1C: No results for input(s): HGBA1C in the last 72 hours.  Fasting Lipid Panel: No results for input(s): CHOL, HDL, LDLCALC, TRIG, CHOLHDL, LDLDIRECT in the last 72 hours. Thyroid Function Tests: No results for input(s): TSH, T4TOTAL, T3FREE, THYROIDAB in the last 72 hours.  Invalid input(s): FREET3  Anemia Panel: No results for input(s): VITAMINB12, FOLATE, FERRITIN, TIBC, IRON , RETICCTPCT in the last 72 hours.   Radiology: [facility] Chest Port 1 View Result Date: 01/29/2024 CLINICAL DATA:  Shortness of breath EXAM: PORTABLE CHEST 1 VIEW COMPARISON:  01/08/2024 FINDINGS: Heart and mediastinal contours within normal limits. Patchy bilateral airspace opacities, right greater than left. No visible significant effusions. No acute bony abnormality. IMPRESSION: Patchy bilateral airspace opacities concerning for infection. Electronically Signed   By: Janeece Mechanic M.D.   On: 01/29/2024 17:31   DG Chest 1 View Result Date: 01/08/2024 CLINICAL DATA:  Congestive heart failure. EXAM: CHEST  1 VIEW COMPARISON:  01/07/2024 FINDINGS: Low volume film. The cardio pericardial silhouette is enlarged. There is pulmonary vascular congestion without overt pulmonary edema. Trace blunting of the left costophrenic angle raises the question of tiny effusion. No acute bony abnormality. Telemetry leads overlie the chest. IMPRESSION: Low volume film with pulmonary vascular congestion. Possible tiny left pleural effusion. Electronically Signed   By: Donnal Fusi M.D.   On: 01/08/2024 07:22   DG Cervical Spine 2 or 3 views Result Date: 01/07/2024 CLINICAL DATA:  [id number] Neck pain [id number] EXAM: CERVICAL SPINE - 2-3 VIEW COMPARISON:  December 07, 2023. FINDINGS: The cervical spine is visualized from C1-C7. Straightening of the cervical lordosis without significant spondylolisthesis. Vertebral body heights are mantle maintained: No evidence of acute fracture. Moderate multilevel intervertebral disc space height loss throughout the cervical spine most pronounced at C4-5, C5-6 and C6-7. Multilevel facet arthropathy and uncovertebral hypertrophy. Limited assessment of the atlantooccipital interval due to overlapping tissue. No prevertebral soft tissue swelling. Prominent contour of the LEFT superior hilum. IMPRESSION: 1. Moderate multilevel degenerative changes of the cervical spine. 2. There is a prominent contour of the LEFT superior hilum. This could reflect a similar prominent pulmonary artery, the lymphadenopathy or space-occupying lesion remains in the differential. Consider further evaluation with dedicated chest CT if clinically indicated. Electronically Signed   By: Clancy Crimes M.D.   On: 01/07/2024 16:44   DG Chest Portable 1 View Result Date: 01/07/2024 CLINICAL DATA:  CP EXAM: PORTABLE CHEST 1 VIEW COMPARISON:  December 09, 2023 FINDINGS: The cardiomediastinal silhouette is unchanged in contour.Atherosclerotic calcifications. Trace LEFT pleural effusion. No pneumothorax. No acute pleuroparenchymal abnormality. IMPRESSION: Trace LEFT pleural effusion. Electronically Signed   By: Clancy Crimes M.D.   On: 01/07/2024 14:49    ECHO 12/08/2023  1. Left ventricular ejection fraction, by estimation, is 60 to 65%. The left ventricle has normal function. The left ventricle has  no regional wall motion abnormalities. Left ventricular diastolic parameters are indeterminate.   2. Right ventricular systolic function is normal. The right ventricular size is normal.   3. Left atrial size was mildly dilated.   4. Large pleural effusion.   5. The mitral valve is normal in structure. Trivial mitral valve regurgitation. No evidence of mitral  stenosis.   6. The aortic valve is normal in structure. Aortic valve regurgitation is not visualized. No aortic stenosis is present.   7. The inferior vena cava is normal in size with greater than 50% respiratory variability, suggesting right atrial pressure of 3 mmHg.   TELEMETRY reviewed by me 02/06/2024: atrial fibrillation, rate 100-110s (AM). Improved 80-90s (afternoon).  EKG reviewed by me: atrial fibrillation RVR, rate 135 bpm  Data reviewed by me 02/06/2024: last 24h vitals tele labs imaging I/O hospitalist progress note  Principal Problem:   CHF (congestive heart failure) (HCC) Active Problems:   Diabetes mellitus without complication (HCC)   Hypothyroidism   Asthma   Coronary artery disease involving native coronary artery of native heart   Renal artery stenosis (HCC)   Hypertension, renovascular   A-fib (HCC)   Malnutrition of moderate degree    ASSESSMENT AND PLAN:   Mary [name] is a 89 y.o. female  with a past medical history of paroxsymal atrial fibrillation (on Eliquis ), chronic HFpEF, coronary artery disease (on CT), hypertension, hyperlipidemia, T2DM, right carotid stenosis s/p CEA, pulmonary hypertension, COPD/asthma who presented to the ED on 01/29/2024 for shortness of breath, palpitations and lower abdominal pain. EKG in ED revealed AF RVR with rates 160s. Patient recently hospitalized on 04/16 with AF RVR and AoCHFpEF with similar presentation.  Cardiology was consulted for further evaluation.   # Paroxymal atrial fibrillation  Patient reports palpitations, SOB for past 3 weeks and presents on admission with AF RVR in 160s. IV Caredizem gtt started in ED. Per tele patient remains in AF with rates 100-110s in AM, HR improved 80-90s s/p 1x IV dilt 10 mg and IV Mg 2g. BP stable -Monitor and replenish electrolytes for a goal K >4, Mag >2  -Continue Eliquis  5 mg twice daily for stroke risk reduction.  -Continue PO digoxin  0.125 mg daily.        -Dig level  ordered. -Continue PO amio 200 mg BID for 10 days, then 200 mg daily.  -Continue 30 mg diltiazem  q6hrs. Uptitrate if BP allows.  -Continue metoprolol  succinate 100 mg daily. -Plan for TEE/DCCV with Dr. Bob Burn for Tuesday (06/17 at 12 PM).  -Discussed the risks and benefits of proceeding with TEE/DCCV for persistent atrial fibrillation with the patient. She is agreeable to proceed.  NPO at midnight until TEE/DCCV tomorrow afternoon (06/17 @ 12PM) with Dr. Bob Burn.  Written consent will be obtained.   # Acute on chronic HFpEF Patient presents with worsening SOB for past 3 weeks. CXR with patchy bilateral airspace opacities. BNP elevated at 820.Echo from 11/2023 with pEF (60-65%), no RWMA. -Continue IV lasix  40 mg BID. Consider transitioning to PO tomorrow.  Closely monitor UOP, electrolytes, renal function. -Beta blocker as stated above. -Continue Farxiga 10 mg daily. (Per pharmacy copay [phone] - spoke to patient about cost and patient agreeable to starting medication).   # Coronary artery disease # Hypertension # Hyperlipidemia Patient denies chest pain. Troponin minimally elevated and flat 19 > 20.  EKG in ED with atrial fibrillation RVR, rate 135 bpm. -Continue Plavix  75 mg daily, rosuvastatin  5 mg daily.  -Beta blocker as stated  above. -Ordered losartan  25 mg daily.  -Minimally elevated and flat trops in setting of AF RVR and AOCHFpEF is most consistent with demand/supply mismatch and not ACS    This patient's plan of care was discussed and created with Dr. Beau Bound and he is in agreement.  Signed: Creighton Doffing, PA-C  02/06/2024, 10:02 AM [facility] Cardiology

## 2024-02-06 NOTE — Progress Notes (Signed)
 PROGRESS NOTE    Mary [name]  ZOX:[id number] DOB: 1935-03-01 DOA: 01/29/2024 PCP: Monique Ano, MD   Assessment & Plan:   Principal Problem:   CHF (congestive heart failure) (HCC) Active Problems:   Hypothyroidism   Diabetes mellitus without complication (HCC)   Asthma   Coronary artery disease involving native coronary artery of native heart   Renal artery stenosis (HCC)   Hypertension, renovascular   A-fib (HCC)   Malnutrition of moderate degree  Assessment and Plan: A-fib: with RVR. Continue on metoprolol , amio, diltiazem , digoxin  & eliquis  as per cardio. Continue on tele. Possible cardioversion on 02/07/24 as per cardio & cardioversion canceled on 02/03/24 b/c pt ate breakfast. Cardio following and recs apprec   Chronic diastolic CHF: continue on IV lasix . Monitor I/Os.    Pulmonary infiltrates: likely pneumonia. Completed abx course. Bronchodilators prn. Encourage incentive spirometry    Acute hypoxic respiratory failure: continue on supplemental oxygen and wean as tolerated. Unable to wean from supplemental oxygen so far and will possibly have to d/c home with oxygen    Hypokalemia: WNL today    History recurrent UTIs: urine cx growing multiple species. C/o dysuria. ? acute UTI. Completed abx course   Vaginal yeast infection: completed fluconazole course x 2 doses. Continue w/ mycolog cream  Hypothyroidism: continue on home dose of levothyroxine     DM2: fair control, HbA1c 7.0. Continue on SSI w/ accuchecks   HTN: continue on coreg    HLD: continue on statin    GAD: severity unknown. Continue on home dsoe of paroxetine    Debility: PT/OT recs HH       DVT prophylaxis: eliquis  Code Status: full  Family Communication: Disposition Plan: likely d/c back home   Level of care: Progressive  Status is: Inpatient Remains inpatient appropriate because: severity of illness, cardioversion tomorrow     Consultants:  Cardio   Procedures:    Antimicrobials:    Subjective: Pt c/o malaise   Objective: Vitals:   02/06/24 0500 02/06/24 0600 02/06/24 0700 02/06/24 0800  BP:    (!) 168/65  Pulse:  (!) 59 62 75  Resp:  19 20 (!) 23  Temp:      TempSrc:      SpO2:  94% 92% 95%  Weight: 63.2 kg     Height:        Intake/Output Summary (Last 24 hours) at 02/06/2024 0852 Last data filed at 02/06/2024 0045 Gross per 24 hour  Intake 243 ml  Output 1050 ml  Net -807 ml   Filed Weights   02/04/24 0500 02/05/24 0500 02/06/24 0500  Weight: 63.2 kg 62.1 kg 63.2 kg    Examination:  General exam: appears comfortable  Respiratory system: decreased breath sounds b/l Cardiovascular system: irregularly irregular  Gastrointestinal system: abd is soft, NT, ND & hypoactive bowel sounds  Central nervous system: alert & oriented. Moves all extremities  Psychiatry: Judgement and insight appears normal. Flat mood and affect    Data Reviewed: I have personally reviewed following labs and imaging studies  CBC: Recent Labs  Lab 01/31/24 0400 02/01/24 0510 02/02/24 0550 02/03/24 0527 02/04/24 0544  WBC 6.1 8.8 7.4 8.5 9.9  HGB 12.0 11.6* 11.1* 11.6* 11.7*  HCT 38.0 36.0 36.2 37.6 37.6  MCV 99.5 98.6 99.5 98.7 98.9  PLT 189 210 210 230 233   Basic Metabolic Panel: Recent Labs  Lab 02/02/24 0550 02/03/24 0527 02/04/24 0544 02/05/24 0440 02/06/24 0403  NA 139 138 138 137 139  K 3.6 3.7  4.1 4.1 3.2*  CL 98 92* 93* 89* 88*  CO2 33* 33* 35* 39* 41*  GLUCOSE 136* 141* 124* 128* 111*  BUN 27* 27* 20 18 16   CREATININE 0.63 0.61 0.54 0.63 0.52  CALCIUM  9.3 9.6 9.6 9.6 9.4  MG 1.7 2.0 2.1 2.0 1.9   GFR: Estimated Creatinine Clearance: 45.4 mL/min (by C-G formula based on SCr of 0.52 mg/dL). Liver Function Tests: No results for input(s): AST, ALT, ALKPHOS, BILITOT, PROT, ALBUMIN in the last 168 hours.  No results for input(s): LIPASE, AMYLASE in the last 168 hours. No results for input(s): AMMONIA  in the last 168 hours. Coagulation Profile: No results for input(s): INR, PROTIME in the last 168 hours. Cardiac Enzymes: No results for input(s): CKTOTAL, CKMB, CKMBINDEX, TROPONINI in the last 168 hours. BNP (last 3 results) No results for input(s): PROBNP in the last 8760 hours. HbA1C: No results for input(s): HGBA1C in the last 72 hours.  CBG: Recent Labs  Lab 02/04/24 1705 02/04/24 2027 02/05/24 1610 02/05/24 2055 02/06/24 0832  GLUCAP 114* 186* 148* 196* 123*   Lipid Profile: No results for input(s): CHOL, HDL, LDLCALC, TRIG, CHOLHDL, LDLDIRECT in the last 72 hours. Thyroid Function Tests: No results for input(s): TSH, T4TOTAL, FREET4, T3FREE, THYROIDAB in the last 72 hours.  Anemia Panel: No results for input(s): VITAMINB12, FOLATE, FERRITIN, TIBC, IRON , RETICCTPCT in the last 72 hours. Sepsis Labs: Recent Labs  Lab 01/31/24 0400  PROCALCITON <0.10    Recent Results (from the past 240 hours)  Urine Culture (for pregnant, neutropenic or urologic patients or patients with an indwelling urinary catheter)     Status: Abnormal   Collection Time: 01/29/24  5:01 PM   Specimen: Urine, Clean Catch  Result Value Ref Range Status   Specimen Description   Final    URINE, CLEAN CATCH Performed at [facility], [address] [place], [place] [address]    Special Requests   Final    NONE Performed at [facility], [address] [place], [place] [address]    Culture MULTIPLE SPECIES PRESENT, SUGGEST RECOLLECTION (A)  Final   Report Status 01/31/2024 FINAL  Final  Blood Culture (routine x 2)     Status: None   Collection Time: 01/29/24  7:24 PM   Specimen: BLOOD  Result Value Ref Range Status   Specimen Description BLOOD RIGHT ANTECUBITAL  Final   Special Requests   Final    BOTTLES DRAWN AEROBIC AND ANAEROBIC Blood Culture results may not be optimal due to an excessive volume of blood received in  culture bottles   Culture   Final    NO GROWTH 5 DAYS Performed at [facility], [address] [place], [place] [address]    Report Status 02/03/2024 FINAL  Final  Resp panel by RT-PCR (RSV, Flu A&B, Covid) Anterior Nasal Swab     Status: None   Collection Time: 01/29/24  7:35 PM   Specimen: Anterior Nasal Swab  Result Value Ref Range Status   SARS Coronavirus 2 by RT PCR NEGATIVE NEGATIVE Final    Comment: (NOTE) SARS-CoV-2 target nucleic acids are NOT DETECTED.  The SARS-CoV-2 RNA is generally detectable in upper respiratory specimens during the acute phase of infection. The lowest concentration of SARS-CoV-2 viral copies this assay can detect is 138 copies/mL. A negative result does not preclude SARS-Cov-2 infection and should not be used as the sole basis for treatment or other patient management decisions. A negative result may occur  with  improper specimen collection/handling, submission of specimen other than nasopharyngeal swab, presence of viral mutation(s) within the areas targeted by this assay, and inadequate number of viral copies(<138 copies/mL). A negative result must be combined with clinical observations, patient history, and epidemiological information. The expected result is Negative.  Fact Sheet for Patients:  BloggerCourse.com  Fact Sheet for Healthcare Providers:  SeriousBroker.it  This test is no t yet approved or cleared by the [place]  FDA and  has been authorized for detection and/or diagnosis of SARS-CoV-2 by FDA under an Emergency Use Authorization (EUA). This EUA will remain  in effect (meaning this test can be used) for the duration of the COVID-19 declaration under Section 564(b)(1) of the Act, 21 U.S.C.section 360bbb-3(b)(1), unless the authorization is terminated  or revoked sooner.       Influenza A by PCR NEGATIVE NEGATIVE Final   Influenza B by PCR NEGATIVE NEGATIVE Final     Comment: (NOTE) The Xpert Xpress SARS-CoV-2/FLU/RSV plus assay is intended as an aid in the diagnosis of influenza from Nasopharyngeal swab specimens and should not be used as a sole basis for treatment. Nasal washings and aspirates are unacceptable for Xpert Xpress SARS-CoV-2/FLU/RSV testing.  Fact Sheet for Patients: BloggerCourse.com  Fact Sheet for Healthcare Providers: SeriousBroker.it  This test is not yet approved or cleared by the [place]  FDA and has been authorized for detection and/or diagnosis of SARS-CoV-2 by FDA under an Emergency Use Authorization (EUA). This EUA will remain in effect (meaning this test can be used) for the duration of the COVID-19 declaration under Section 564(b)(1) of the Act, 21 U.S.C. section 360bbb-3(b)(1), unless the authorization is terminated or revoked.     Resp Syncytial Virus by PCR NEGATIVE NEGATIVE Final    Comment: (NOTE) Fact Sheet for Patients: BloggerCourse.com  Fact Sheet for Healthcare Providers: SeriousBroker.it  This test is not yet approved or cleared by the [place]  FDA and has been authorized for detection and/or diagnosis of SARS-CoV-2 by FDA under an Emergency Use Authorization (EUA). This EUA will remain in effect (meaning this test can be used) for the duration of the COVID-19 declaration under Section 564(b)(1) of the Act, 21 U.S.C. section 360bbb-3(b)(1), unless the authorization is terminated or revoked.  Performed at [facility], [address] [place], [place] [address]   Blood Culture (routine x 2)     Status: None   Collection Time: 01/29/24  9:20 PM   Specimen: BLOOD  Result Value Ref Range Status   Specimen Description BLOOD RIGHT ANTECUBITAL  Final   Special Requests   Final    BOTTLES DRAWN AEROBIC AND ANAEROBIC Blood Culture results may not be optimal due to an excessive volume  of blood received in culture bottles   Culture   Final    NO GROWTH 5 DAYS Performed at [facility], [address] [place], [place] [address]    Report Status 02/03/2024 FINAL  Final  Respiratory (~20 pathogens) panel by PCR     Status: None   Collection Time: 01/30/24  9:10 AM   Specimen: Nasopharyngeal Swab; Respiratory  Result Value Ref Range Status   Adenovirus NOT DETECTED NOT DETECTED Final   Coronavirus 229E NOT DETECTED NOT DETECTED Final    Comment: (NOTE) The Coronavirus on the Respiratory Panel, DOES NOT test for the novel  Coronavirus (2019 nCoV)    Coronavirus HKU1 NOT DETECTED NOT DETECTED Final   Coronavirus NL63 NOT DETECTED NOT DETECTED Final   Coronavirus OC43 NOT  DETECTED NOT DETECTED Final   Metapneumovirus NOT DETECTED NOT DETECTED Final   Rhinovirus / Enterovirus NOT DETECTED NOT DETECTED Final   Influenza A NOT DETECTED NOT DETECTED Final   Influenza B NOT DETECTED NOT DETECTED Final   Parainfluenza Virus 1 NOT DETECTED NOT DETECTED Final   Parainfluenza Virus 2 NOT DETECTED NOT DETECTED Final   Parainfluenza Virus 3 NOT DETECTED NOT DETECTED Final   Parainfluenza Virus 4 NOT DETECTED NOT DETECTED Final   Respiratory Syncytial Virus NOT DETECTED NOT DETECTED Final   Bordetella pertussis NOT DETECTED NOT DETECTED Final   Bordetella Parapertussis NOT DETECTED NOT DETECTED Final   Chlamydophila pneumoniae NOT DETECTED NOT DETECTED Final   Mycoplasma pneumoniae NOT DETECTED NOT DETECTED Final    Comment: Performed at [facility], [address], [place], [place] [address]         Radiology Studies: No results found.      Scheduled Meds:  amiodarone   200 mg Oral BID   Followed by   Cecily Cohen ON 02/11/2024] amiodarone   200 mg Oral Daily   apixaban   5 mg Oral BID   busPIRone   10 mg Oral BID   clopidogrel   75 mg Oral Daily   dapagliflozin propanediol  10 mg Oral Daily   digoxin   0.125 mg Oral Daily   diltiazem   30 mg Oral Q6H    feeding supplement  237 mL Oral BID BM   furosemide   40 mg Intravenous BID   insulin  aspart  0-5 Units Subcutaneous QHS   insulin  aspart  0-9 Units Subcutaneous TID WC   levothyroxine   75 mcg Oral Q0600   metoprolol  succinate  100 mg Oral Daily   mirabegron  ER  25 mg Oral Daily   multivitamin with minerals  1 tablet Oral Daily   nystatin-triamcinolone  1 Application Topical BID   pantoprazole   20 mg Oral Daily   PARoxetine   40 mg Oral QPM   potassium chloride   40 mEq Oral BID   rosuvastatin   5 mg Oral Daily   sodium chloride  flush  3 mL Intravenous Q12H   Continuous Infusions:      LOS: 8 days      Alphonsus Jeans, MD [facility] Pager [phone]  If 7PM-7AM, please contact night-coverage [email or web address] 02/06/2024, 8:52 AM

## 2024-02-06 NOTE — Progress Notes (Signed)
 Physical Therapy Treatment Patient Details Name: Mary [name] MRN: [id number] DOB: 02/20/1935 Today's Date: 02/06/2024   History of Present Illness Mary [name] is an 88yoF admitted for AF c RVR, hypokalemia, & hypomagnesemia. PMH significant for PAF on Eliquis , HTN, CAD, asthma/COPD, chronic HFpEF, HLD, RA, sleep apnea, spinal stenosis. Recently returned home from STR 5 days ago.    PT Comments  Pt in bed, awake, agreeable to session. Family enters room shortly after, but decides to wait until after PT session for their visit. Pt performs multiple transfers at supervision level with safe RW use, no cues needed. Pt shows generally good tolerance in interventions, no gross signs of fatigue or exertion. Pt does have multiple backward LOB in session, able to arrest all with RW or return to sitting, except for 1 that author assists with. Enters room for rounding, pt assisted to recliner in anticipation of her family returning for visit. VSS throughout my visit. Pt is improving in stamina and activity tolerance, however she continues to be limited from a balance standpoint. Will continue to follow.    If plan is discharge home, recommend the following: A little help with walking and/or transfers;A little help with bathing/dressing/bathroom;Assist for transportation;Help with stairs or ramp for entrance   Can travel by private vehicle        Equipment Recommendations  None recommended by PT    Recommendations for Other Services       Precautions / Restrictions Precautions Precautions: Fall Recall of Precautions/Restrictions: Intact Restrictions Weight Bearing Restrictions Per Provider Order: No     Mobility  Bed Mobility Overal bed mobility: Modified Independent Bed Mobility: Supine to Sit     Supine to sit: Supervision          Transfers Overall transfer level: Modified independent Equipment used: Rolling walker (2 wheels) Transfers: Sit to/from Stand Sit to Stand:  Supervision   Step pivot transfers: Contact guard assist            Ambulation/Gait Ambulation/Gait assistance:  (deferred for time this session)                 Stairs             Wheelchair Mobility     Tilt Bed    Modified Rankin (Stroke Patients Only)       Balance                                            Communication    Cognition Arousal: Alert Behavior During Therapy: WFL for tasks assessed/performed   PT - Cognitive impairments: No apparent impairments                                Cueing    Exercises Other Exercises Other Exercises: STS from EOB to RW, supervision level x8 (self paced), no LOB Other Exercises: Standing dynamic balance activity at EOB hands-free (throwing activity) x2 minutes (several posterior LOB) Other Exercises: Standing alterante toe taps x12, RW support, toes tapping 5 step)    General Comments        Pertinent Vitals/Pain Pain Assessment Pain Assessment: No/denies pain    Home Living                          Prior  Function            PT Goals (current goals can now be found in the care plan section) Acute Rehab PT Goals Patient Stated Goal: return home PT Goal Formulation: With patient Time For Goal Achievement: 02/13/24 Potential to Achieve Goals: Good Progress towards PT goals: Progressing toward goals    Frequency    Min 2X/week      PT Plan      Co-evaluation              AM-PAC PT 6 Clicks Mobility   Outcome Measure  Help needed turning from your back to your side while in a flat bed without using bedrails?: A Little Help needed moving from lying on your back to sitting on the side of a flat bed without using bedrails?: A Little Help needed moving to and from a bed to a chair (including a wheelchair)?: A Little Help needed standing up from a chair using your arms (e.g., wheelchair or bedside chair)?: A Little Help needed to walk  in hospital room?: A Little Help needed climbing 3-5 steps with a railing? : A Little 6 Click Score: 18    End of Session   Activity Tolerance: Patient tolerated treatment well;No increased pain Patient left: in chair;with call bell/phone within reach;with chair alarm set   PT Visit Diagnosis: Muscle weakness (generalized) (M62.81)     Time: 1610-9604 PT Time Calculation (min) (ACUTE ONLY): 19 min  Charges:    $Neuromuscular Re-education: 8-22 mins PT General Charges $$ ACUTE PT VISIT: 1 Visit                    12:43 PM, 02/06/24 Dawn Eth, PT, DPT Physical Therapist - [facility]  [phone] (ASCOM)    [name] 02/06/2024, 12:40 PM

## 2024-02-07 ENCOUNTER — Inpatient Hospital Stay: Admitting: Anesthesiology

## 2024-02-07 ENCOUNTER — Encounter
Admission: EM | Disposition: A | Discharge status: Skilled Nursing Facility | Payer: Self-pay | Source: Home / Self Care | Attending: Internal Medicine

## 2024-02-07 ENCOUNTER — Inpatient Hospital Stay: Admit: 2024-02-07 | Discharge: 2024-02-07 | Disposition: A | Discharge status: Home / Self Care

## 2024-02-07 DIAGNOSIS — I480 Paroxysmal atrial fibrillation: Secondary | ICD-10-CM | POA: Diagnosis not present

## 2024-02-07 HISTORY — PX: CARDIOVERSION: SHX1299

## 2024-02-07 HISTORY — PX: TEE WITHOUT CARDIOVERSION: SHX5443

## 2024-02-07 LAB — BASIC METABOLIC PANEL WITH GFR
Anion gap: 11 (ref 5–15)
BUN: 15 mg/dL (ref 8–23)
CO2: 37 mmol/L — ABNORMAL HIGH (ref 22–32)
Calcium: 9.4 mg/dL (ref 8.9–10.3)
Chloride: 90 mmol/L — ABNORMAL LOW (ref 98–111)
Creatinine, Ser: 0.61 mg/dL (ref 0.44–1.00)
GFR, Estimated: >60 mL/min (ref >60)
Glucose, Bld: 125 mg/dL — ABNORMAL HIGH (ref 70–99)
Potassium: 4.4 mmol/L (ref 3.5–5.1)
Sodium: 138 mmol/L (ref 135–145)

## 2024-02-07 LAB — GLUCOSE, CAPILLARY
Glucose-Capillary: 138 mg/dL — ABNORMAL HIGH (ref 70–99)
Glucose-Capillary: 228 mg/dL — ABNORMAL HIGH (ref 70–99)
Glucose-Capillary: 199 mg/dL — ABNORMAL HIGH (ref 70–99)

## 2024-02-07 LAB — MAGNESIUM: Magnesium: 2.1 mg/dL (ref 1.7–2.4)

## 2024-02-07 LAB — ECHO TEE

## 2024-02-07 SURGERY — CARDIOVERSION
Anesthesia: General

## 2024-02-07 SURGERY — ECHOCARDIOGRAM, TRANSESOPHAGEAL
Anesthesia: General

## 2024-02-07 MED ORDER — SODIUM CHLORIDE 0.9 % IV SOLN: INTRAVENOUS | Status: DC | PRN

## 2024-02-07 MED ORDER — SODIUM CHLORIDE 0.9 % IV SOLN: INTRAVENOUS | Status: DC

## 2024-02-07 MED ORDER — LIDOCAINE VISCOUS HCL 2 % MT SOLN
OROMUCOSAL | Status: AC
  Filled 2024-02-07: qty 15

## 2024-02-07 MED ORDER — BUTAMBEN-TETRACAINE-BENZOCAINE 2-2-14 % EX AERO
INHALATION_SPRAY | CUTANEOUS | Status: AC
  Filled 2024-02-07: qty 5

## 2024-02-07 MED ORDER — SPIRONOLACTONE 12.5 MG HALF TABLET
12.5000 mg | ORAL_TABLET | Freq: Every day | ORAL | Status: DC
  Administered 2024-02-07: 12.5 mg via ORAL
  Filled 2024-02-07 (×3): qty 1

## 2024-02-07 MED ORDER — PHENOL 1.4 % MT LIQD
1.0000 | OROMUCOSAL | Status: DC | PRN
  Administered 2024-02-07 – 2024-02-09 (×2): 1 via OROMUCOSAL
  Filled 2024-02-07 (×2): qty 177

## 2024-02-07 MED ORDER — PROPOFOL 10 MG/ML IV BOLUS
INTRAVENOUS | Status: AC
  Filled 2024-02-07: qty 20

## 2024-02-07 MED ORDER — PROPOFOL 10 MG/ML IV BOLUS
INTRAVENOUS | Status: DC | PRN
  Administered 2024-02-07: 20 mg via INTRAVENOUS
  Administered 2024-02-07: 10 mg via INTRAVENOUS
  Administered 2024-02-07 (×2): 20 mg via INTRAVENOUS

## 2024-02-07 NOTE — Plan of Care (Signed)
  Problem: Metabolic: Goal: Ability to maintain appropriate glucose levels will improve Outcome: Progressing   Problem: Health Behavior/Discharge Planning: Goal: Ability to manage health-related needs will improve Outcome: Progressing   Problem: Activity: Goal: Risk for activity intolerance will decrease Outcome: Progressing

## 2024-02-07 NOTE — Anesthesia Preprocedure Evaluation (Addendum)
 Anesthesia Evaluation  Patient identified by MRN, date of birth, ID band Patient awake    Reviewed: Allergy & Precautions, NPO status , Patient's Chart, lab work & pertinent test results, reviewed documented beta blocker date and time   History of Anesthesia Complications (+) PONV and history of anesthetic complications  Airway Mallampati: III  TM Distance: <3 FB Neck ROM: limited    Dental  (+) Poor Dentition, Chipped   Pulmonary asthma , sleep apnea  CXR on admission showing patchy bilateral opacities, no effusion.   Pulmonary exam normal        Cardiovascular Exercise Tolerance: Good hypertension, Pt. on medications and Pt. on home beta blockers (-) angina + CAD, + Peripheral Vascular Disease and +CHF (chronic diastolic HF)  + dysrhythmias Atrial Fibrillation (-) Valvular Problems/Murmurs Rhythm:Irregular Rate:Normal - Systolic murmurs, - Carotid Bruit and - Peripheral Edema MPS 3/23: Regional wall motion: reveals normal myocardialthickening and wall  motion.  The overall quality of the study is good.   Artifacts noted: no  Left ventricular cavity: normal.    TTE 11/2023: 1. Left ventricular ejection fraction, by estimation, is 60 to 65%. The  left ventricle has normal function. The left ventricle has no regional  wall motion abnormalities. Left ventricular diastolic parameters are  indeterminate.   2. Right ventricular systolic function is normal. The right ventricular  size is normal.   3. Left atrial size was mildly dilated.   4. Large pleural effusion.   5. The mitral valve is normal in structure. Trivial mitral valve  regurgitation. No evidence of mitral stenosis.   6. The aortic valve is normal in structure. Aortic valve regurgitation is  not visualized. No aortic stenosis is present.   7. The inferior vena cava is normal in size with greater than 50%  respiratory variability, suggesting right atrial pressure  of 3 mmHg.     Neuro/Psych  PSYCHIATRIC DISORDERS Anxiety Depression     Neuromuscular disease    GI/Hepatic Neg liver ROS,GERD  Medicated and Controlled,,  Endo/Other  diabetes, Well Controlled, Type 2Hypothyroidism  A1c 6.5  Renal/GU negative Renal ROS  negative genitourinary   Musculoskeletal  (+) Arthritis , Rheumatoid disorders,    Abdominal Normal abdominal exam  (+)   Peds  Hematology negative hematology ROS (+)   Anesthesia Other Findings Past Medical History: No date: Acid reflux No date: Anemia No date: Arthritis No date: Asthma No date: Atherosclerosis of abdominal aorta (HCC) No date: B12 deficiency No date: Clostridium difficile infection     Comment:  H/O No date: Coronary artery disease No date: DDD (degenerative disc disease), lumbar No date: Depression No date: Diabetes (HCC) No date: Diabetic retinopathy (HCC) No date: Dysrhythmia No date: Heart murmur No date: HLD (hyperlipidemia) No date: HTN (hypertension) No date: Hypotension     Comment:  when get up too quickly No date: Hypothyroidism No date: Pulmonary nodule No date: RA (rheumatoid arthritis) (HCC) No date: Sleep apnea No date: Urinary urgency  Past Surgical History: No date: ABDOMINAL HYSTERECTOMY No date: CYSTOSCOPY 09/24/2015: HIP ARTHROPLASTY; Right     Comment:  Procedure: ARTHROPLASTY BIPOLAR HIP (HEMIARTHROPLASTY);               Surgeon: Elner Hahn, MD;  Location: [facility] ORS;  Service:              Orthopedics;  Laterality: Right; 09/29/2018: HIP ARTHROPLASTY; Left     Comment:  Procedure: ARTHROPLASTY BIPOLAR HIP (HEMIARTHROPLASTY)  LEFT;  Surgeon: Elner Hahn, MD;  Location: [facility] ORS;                Service: Orthopedics;  Laterality: Left; No date: JOINT REPLACEMENT     Comment:  right hip No date: JOINT REPLACEMENT     Comment:  right knee No date: KNEE ARTHROSCOPY W/ AUTOGENOUS CARTILAGE IMPLANTATION (ACI)  PROCEDURE 12/01/2017: TOTAL KNEE ARTHROPLASTY;  Left     Comment:  Procedure: TOTAL KNEE ARTHROPLASTY;  Surgeon: Elner Hahn, MD;  Location: [facility] ORS;  Service: Orthopedics;                Laterality: Left; No date: VARICOSE VEIN SURGERY No date: VEIN SURGERY  BMI    Body Mass Index: 27.50 kg/m      Reproductive/Obstetrics negative OB ROS                             Anesthesia Physical Anesthesia Plan  ASA: 3  Anesthesia Plan: General   Post-op Pain Management: Minimal or no pain anticipated   Induction: Intravenous  PONV Risk Score and Plan: 4 or greater and Ondansetron , Treatment may vary due to age or medical condition, TIVA and Propofol  infusion  Airway Management Planned: Natural Airway  Additional Equipment: None  Intra-op Plan:   Post-operative Plan:   Informed Consent: I have reviewed the patients History and Physical, chart, labs and discussed the procedure including the risks, benefits and alternatives for the proposed anesthesia with the patient or authorized representative who has indicated his/her understanding and acceptance.     Dental advisory given  Plan Discussed with: Anesthesiologist, CRNA and Surgeon  Anesthesia Plan Comments: (Discussed risks of anesthesia with patient, including possibility of difficulty with spontaneous ventilation under anesthesia necessitating airway intervention, PONV, and rare risks such as cardiac or respiratory or neurological events, and allergic reactions. Discussed the role of CRNA in patient's perioperative care. Patient understands.)        Anesthesia Quick Evaluation

## 2024-02-07 NOTE — Progress Notes (Signed)
 PROGRESS NOTE   HPI was taken from Dr. Cicero Crawley: Mary [name] is a 89 y.o. female with medical history significant of coronary disease, hypertension, paroxysmal atrial fibrillation on anticoagulation with Eliquis , HFpEF, asthma/COPD as a result of secondhand smoking, hypothyroidism She presents to the emergency room with multiple complaints that include palpitations, associated shortness of breath and lower abdominal pain and spasms.  She felt her lower abdominal symptoms were likely attributed to urinary tract infection.  She denies any fever or chills.   In the ER, patient was noted to be in A-fib with RVR.  Heart rates in the 160s.  Patient required oxygen supplementation in the ED.  Chest x-ray did reveal some vascular congestion.  Abnormal urinalysis suggesting possible UTI.   She was initiated on IV Cardizem  in the ED.  Rate is better controlled.    Mary [name]  ZOX:[id number] DOB: 28-Jan-1935 DOA: 01/29/2024 PCP: Monique Ano, MD   Assessment & Plan:   Principal Problem:   CHF (congestive heart failure) (HCC) Active Problems:   Hypothyroidism   Diabetes mellitus without complication (HCC)   Asthma   Coronary artery disease involving native coronary artery of native heart   Renal artery stenosis (HCC)   Hypertension, renovascular   A-fib (HCC)   Malnutrition of moderate degree  Assessment and Plan: A-fib: with RVR. S/p cardioversion on 02/07/24 in which pt converted to NSR. Continue on metoprolol , amio, eliquis  as per cardio. D/c digoxin  & diltiazem  as per cardio. Continue on tele. Cardio following and recs apprec   Chronic diastolic CHF: continue on IV lasix  and metoprolol , farxiga, losartan . Monitor I/Os. Cardio following and recs apprec    Pulmonary infiltrates: likely pneumonia. Completed abx course. Bronchodilators prn. Encourage incentive spirometry    Acute hypoxic respiratory failure: continue on supplemental oxygen and wean as tolerated. Unable to wean from  supplemental oxygen so far and will possibly have to d/c home with oxygen    Hypokalemia: WNL today    History recurrent UTIs: urine cx growing multiple species. C/o dysuria. ? acute UTI. Completed abx course   Vaginal yeast infection: completed fluconazole course x 2 doses. Continue w/ mycolog cream   Hypothyroidism: continue on levothyroxine     DM2: fair control, HbA1c 7.0. Continue on SSI w/ accuchecks   HTN: continue on coreg    HLD: continue on statin    GAD: severity unknown. Continue on home dose of paroxetine    Debility: PT/OT recs HH       DVT prophylaxis: eliquis  Code Status: full  Family Communication: Disposition Plan: likely d/c back home   Level of care: Progressive  Status is: Inpatient Remains inpatient appropriate because: severity of illness, s/p cardioversion which was successful. Can d/c once cleared by cardio     Consultants:  Cardio   Procedures:   Antimicrobials:    Subjective: Pt c/o fatigue   Objective: Vitals:   02/06/24 2357 02/07/24 0331 02/07/24 0500 02/07/24 0834  BP: 131/60 127/67  (!) 146/72  Pulse: (!) 55 67  87  Resp: 18 20    Temp: 98.2 F (36.8 C) 98.5 F (36.9 C)  98.7 F (37.1 C)  TempSrc:      SpO2: 94% 95%  95%  Weight:   60.6 kg   Height:        Intake/Output Summary (Last 24 hours) at 02/07/2024 0852 Last data filed at 02/06/2024 1300 Gross per 24 hour  Intake 0 ml  Output --  Net 0 ml   [employer]  02/05/24 0500 02/06/24 0500 02/07/24 0500  Weight: 62.1 kg 63.2 kg 60.6 kg    Examination:  General exam: appears calm & comfortable  Respiratory system: diminished breath sounds b/l  Cardiovascular system: S1/S2+. No rubs or clicks  Gastrointestinal system: abd is soft, NT, ND & hypoactive bowel sounds  Central nervous system: alert & oriented. Moves all extremities  Psychiatry: Judgement and insight appears normal. Flat mood and affect    Data Reviewed: I have personally reviewed following  labs and imaging studies  CBC: Recent Labs  Lab 02/01/24 0510 02/02/24 0550 02/03/24 0527 02/04/24 0544  WBC 8.8 7.4 8.5 9.9  HGB 11.6* 11.1* 11.6* 11.7*  HCT 36.0 36.2 37.6 37.6  MCV 98.6 99.5 98.7 98.9  PLT 210 210 230 233   Basic Metabolic Panel: Recent Labs  Lab 02/03/24 0527 02/04/24 0544 02/05/24 0440 02/06/24 0403 02/07/24 0333 02/07/24 0732  NA 138 138 137 139  --  138  K 3.7 4.1 4.1 3.2*  --  4.4  CL 92* 93* 89* 88*  --  90*  CO2 33* 35* 39* 41*  --  37*  GLUCOSE 141* 124* 128* 111*  --  125*  BUN 27* 20 18 16   --  15  CREATININE 0.61 0.54 0.63 0.52  --  0.61  CALCIUM  9.6 9.6 9.6 9.4  --  9.4  MG 2.0 2.1 2.0 1.9 2.1  --    GFR: Estimated Creatinine Clearance: 45.4 mL/min (by C-G formula based on SCr of 0.61 mg/dL). Liver Function Tests: No results for input(s): AST, ALT, ALKPHOS, BILITOT, PROT, ALBUMIN in the last 168 hours.  No results for input(s): LIPASE, AMYLASE in the last 168 hours. No results for input(s): AMMONIA in the last 168 hours. Coagulation Profile: No results for input(s): INR, PROTIME in the last 168 hours. Cardiac Enzymes: No results for input(s): CKTOTAL, CKMB, CKMBINDEX, TROPONINI in the last 168 hours. BNP (last 3 results) No results for input(s): PROBNP in the last 8760 hours. HbA1C: No results for input(s): HGBA1C in the last 72 hours.  CBG: Recent Labs  Lab 02/05/24 2055 02/06/24 0832 02/06/24 1254 02/06/24 1551 02/06/24 2242  GLUCAP 196* 123* 211* 161* 128*   Lipid Profile: No results for input(s): CHOL, HDL, LDLCALC, TRIG, CHOLHDL, LDLDIRECT in the last 72 hours. Thyroid Function Tests: No results for input(s): TSH, T4TOTAL, FREET4, T3FREE, THYROIDAB in the last 72 hours.  Anemia Panel: No results for input(s): VITAMINB12, FOLATE, FERRITIN, TIBC, IRON , RETICCTPCT in the last 72 hours. Sepsis Labs: No results for input(s): PROCALCITON,  LATICACIDVEN in the last 168 hours.   Recent Results (from the past 240 hours)  Urine Culture (for pregnant, neutropenic or urologic patients or patients with an indwelling urinary catheter)     Status: Abnormal   Collection Time: 01/29/24  5:01 PM   Specimen: Urine, Clean Catch  Result Value Ref Range Status   Specimen Description   Final    URINE, CLEAN CATCH Performed at [facility], [address] [place], [place] [address]    Special Requests   Final    NONE Performed at [facility], [address] [place], [place] [address]    Culture MULTIPLE SPECIES PRESENT, SUGGEST RECOLLECTION (A)  Final   Report Status 01/31/2024 FINAL  Final  Blood Culture (routine x 2)     Status: None   Collection Time: 01/29/24  7:24 PM   Specimen: BLOOD  Result Value Ref Range Status   Specimen Description BLOOD RIGHT  ANTECUBITAL  Final   Special Requests   Final    BOTTLES DRAWN AEROBIC AND ANAEROBIC Blood Culture results may not be optimal due to an excessive volume of blood received in culture bottles   Culture   Final    NO GROWTH 5 DAYS Performed at [facility], [address] [place], [place] [address]    Report Status 02/03/2024 FINAL  Final  Resp panel by RT-PCR (RSV, Flu A&B, Covid) Anterior Nasal Swab     Status: None   Collection Time: 01/29/24  7:35 PM   Specimen: Anterior Nasal Swab  Result Value Ref Range Status   SARS Coronavirus 2 by RT PCR NEGATIVE NEGATIVE Final    Comment: (NOTE) SARS-CoV-2 target nucleic acids are NOT DETECTED.  The SARS-CoV-2 RNA is generally detectable in upper respiratory specimens during the acute phase of infection. The lowest concentration of SARS-CoV-2 viral copies this assay can detect is 138 copies/mL. A negative result does not preclude SARS-Cov-2 infection and should not be used as the sole basis for treatment or other patient management decisions. A negative result may occur with  improper specimen  collection/handling, submission of specimen other than nasopharyngeal swab, presence of viral mutation(s) within the areas targeted by this assay, and inadequate number of viral copies(<138 copies/mL). A negative result must be combined with clinical observations, patient history, and epidemiological information. The expected result is Negative.  Fact Sheet for Patients:  BloggerCourse.com  Fact Sheet for Healthcare Providers:  SeriousBroker.it  This test is no t yet approved or cleared by the [place]  FDA and  has been authorized for detection and/or diagnosis of SARS-CoV-2 by FDA under an Emergency Use Authorization (EUA). This EUA will remain  in effect (meaning this test can be used) for the duration of the COVID-19 declaration under Section 564(b)(1) of the Act, 21 U.S.C.section 360bbb-3(b)(1), unless the authorization is terminated  or revoked sooner.       Influenza A by PCR NEGATIVE NEGATIVE Final   Influenza B by PCR NEGATIVE NEGATIVE Final    Comment: (NOTE) The Xpert Xpress SARS-CoV-2/FLU/RSV plus assay is intended as an aid in the diagnosis of influenza from Nasopharyngeal swab specimens and should not be used as a sole basis for treatment. Nasal washings and aspirates are unacceptable for Xpert Xpress SARS-CoV-2/FLU/RSV testing.  Fact Sheet for Patients: BloggerCourse.com  Fact Sheet for Healthcare Providers: SeriousBroker.it  This test is not yet approved or cleared by the [place]  FDA and has been authorized for detection and/or diagnosis of SARS-CoV-2 by FDA under an Emergency Use Authorization (EUA). This EUA will remain in effect (meaning this test can be used) for the duration of the COVID-19 declaration under Section 564(b)(1) of the Act, 21 U.S.C. section 360bbb-3(b)(1), unless the authorization is terminated or revoked.     Resp Syncytial  Virus by PCR NEGATIVE NEGATIVE Final    Comment: (NOTE) Fact Sheet for Patients: BloggerCourse.com  Fact Sheet for Healthcare Providers: SeriousBroker.it  This test is not yet approved or cleared by the [place]  FDA and has been authorized for detection and/or diagnosis of SARS-CoV-2 by FDA under an Emergency Use Authorization (EUA). This EUA will remain in effect (meaning this test can be used) for the duration of the COVID-19 declaration under Section 564(b)(1) of the Act, 21 U.S.C. section 360bbb-3(b)(1), unless the authorization is terminated or revoked.  Performed at [facility], [address] [place], [place] [address]   Blood Culture (routine x 2)  Status: None   Collection Time: 01/29/24  9:20 PM   Specimen: BLOOD  Result Value Ref Range Status   Specimen Description BLOOD RIGHT ANTECUBITAL  Final   Special Requests   Final    BOTTLES DRAWN AEROBIC AND ANAEROBIC Blood Culture results may not be optimal due to an excessive volume of blood received in culture bottles   Culture   Final    NO GROWTH 5 DAYS Performed at [facility], [address] [place], [place] [address]    Report Status 02/03/2024 FINAL  Final  Respiratory (~20 pathogens) panel by PCR     Status: None   Collection Time: 01/30/24  9:10 AM   Specimen: Nasopharyngeal Swab; Respiratory  Result Value Ref Range Status   Adenovirus NOT DETECTED NOT DETECTED Final   Coronavirus 229E NOT DETECTED NOT DETECTED Final    Comment: (NOTE) The Coronavirus on the Respiratory Panel, DOES NOT test for the novel  Coronavirus (2019 nCoV)    Coronavirus HKU1 NOT DETECTED NOT DETECTED Final   Coronavirus NL63 NOT DETECTED NOT DETECTED Final   Coronavirus OC43 NOT DETECTED NOT DETECTED Final   Metapneumovirus NOT DETECTED NOT DETECTED Final   Rhinovirus / Enterovirus NOT DETECTED NOT DETECTED Final   Influenza A NOT DETECTED NOT DETECTED  Final   Influenza B NOT DETECTED NOT DETECTED Final   Parainfluenza Virus 1 NOT DETECTED NOT DETECTED Final   Parainfluenza Virus 2 NOT DETECTED NOT DETECTED Final   Parainfluenza Virus 3 NOT DETECTED NOT DETECTED Final   Parainfluenza Virus 4 NOT DETECTED NOT DETECTED Final   Respiratory Syncytial Virus NOT DETECTED NOT DETECTED Final   Bordetella pertussis NOT DETECTED NOT DETECTED Final   Bordetella Parapertussis NOT DETECTED NOT DETECTED Final   Chlamydophila pneumoniae NOT DETECTED NOT DETECTED Final   Mycoplasma pneumoniae NOT DETECTED NOT DETECTED Final    Comment: Performed at [facility], [address], [place], [place] [address]         Radiology Studies: No results found.      Scheduled Meds:  amiodarone   200 mg Oral BID   Followed by   Cecily Cohen ON 02/11/2024] amiodarone   200 mg Oral Daily   apixaban   5 mg Oral BID   busPIRone   10 mg Oral BID   clopidogrel   75 mg Oral Daily   dapagliflozin propanediol  10 mg Oral Daily   digoxin   0.125 mg Oral Daily   diltiazem   30 mg Oral Q6H   feeding supplement  237 mL Oral BID BM   furosemide   40 mg Intravenous BID   insulin  aspart  0-5 Units Subcutaneous QHS   insulin  aspart  0-9 Units Subcutaneous TID WC   levothyroxine   75 mcg Oral Q0600   losartan   25 mg Oral Daily   metoprolol  succinate  100 mg Oral Daily   mirabegron  ER  25 mg Oral Daily   multivitamin with minerals  1 tablet Oral Daily   nystatin-triamcinolone  1 Application Topical BID   pantoprazole   20 mg Oral Daily   PARoxetine   40 mg Oral QPM   rosuvastatin   5 mg Oral Daily   sodium chloride  flush  3 mL Intravenous Q12H   Continuous Infusions:      LOS: 9 days      Alphonsus Jeans, MD [facility] Pager [phone]  If 7PM-7AM, please contact night-coverage [email or web address] 02/07/2024, 8:52 AM

## 2024-02-07 NOTE — Anesthesia Postprocedure Evaluation (Signed)
 Anesthesia Post Note  Patient: Lyndie Tsosie Gail  Procedure(s) Performed: ECHOCARDIOGRAM, TRANSESOPHAGEAL CARDIOVERSION  Patient location during evaluation: Specials Recovery Anesthesia Type: General Level of consciousness: awake and alert Pain management: pain level controlled Vital Signs Assessment: post-procedure vital signs reviewed and stable Respiratory status: spontaneous breathing, nonlabored ventilation, respiratory function stable and patient connected to nasal cannula oxygen Cardiovascular status: blood pressure returned to baseline and stable Postop Assessment: no apparent nausea or vomiting Anesthetic complications: no  No notable events documented.   Last Vitals:  Vitals:   02/07/24 1315 02/07/24 1330  BP: (!) 140/55 (!) 166/57  Pulse: (!) 48 (!) 47  Resp: (!) 22 16  Temp:    SpO2: 97% 97%    Last Pain:  Vitals:   02/07/24 1330  TempSrc:   PainSc: 0-No pain                 Enrique Harvest

## 2024-02-07 NOTE — Progress Notes (Signed)
 [employer] [facility] CLINIC CARDIOLOGY PROGRESS NOTE       [name] MRN: [id number] DOB/AGE: 05-14-1935 89 y.o.  Admit date: 01/29/2024 Referring Physician Dr. Hines Ludwig Primary Physician Monique Ano, MD Primary Cardiologist Dr. Larinda Plover Reason for Consultation AF RVR  HPI: Mary [name] is a 89 y.o. female  with a past medical history of paroxsymal atrial fibrillation (on Eliquis ), chronic HFpEF, coronary artery disease (on CT), hypertension, hyperlipidemia, T2DM, right carotid stenosis s/p CEA, pulmonary hypertension, COPD/asthma who presented to the ED on 01/29/2024 for shortness of breath, palpitations and lower abdominal pain. EKG in ED revealed AF RVR with rates 160s. Cardiology was consulted for further evaluation.   Interval History: -Patient seen and examined this AM and laying comfortably in hospital bed. Patient states she continues to feel better and states SOB continues to improve, however not at baseline. Denies chest pain or palpitations.   -Patients BP elevated and HR improving.  -Per tele remains in AF with improved rates 70-80s this AM. -Patient remains on 2L [place] with stable SpO2.  -TEE/DCCV with Dr. Bob Burn today (06/17). Patient converted to SR with rates 50s this afternoon   Review of systems complete and found to be negative unless listed above    Past Medical History:  Diagnosis Date   Acid reflux    Anemia    Arthritis    Asthma    Atherosclerosis of abdominal aorta (HCC)    B12 deficiency    Clostridium difficile infection    H/O   Coronary artery disease    DDD (degenerative disc disease), lumbar    Depression    Diabetes (HCC)    type 2   Diabetic retinopathy (HCC)    Dysrhythmia    Heart murmur    HLD (hyperlipidemia)    HTN (hypertension)    Hypotension    when get up too quickly   Hypothyroidism    Pneumonia    PONV (postoperative nausea and vomiting)    Pulmonary nodule    right middle lobe on CT scan   Pure  hypercholesterolemia    RA (rheumatoid arthritis) (HCC)    Sleep apnea    Spinal stenosis    Urinary urgency     Past Surgical History:  Procedure Laterality Date   ABDOMINAL HYSTERECTOMY     CATARACT EXTRACTION, BILATERAL     COLONOSCOPY N/A 07/29/2021   Procedure: COLONOSCOPY;  Surgeon: Toledo, Alphonsus Jeans, MD;  Location: [facility] END[facility];  Service: Gastroenterology;  Laterality: N/A;   CYSTOSCOPY     ENDARTERECTOMY Right 12/02/2021   Procedure: ENDARTERECTOMY CAROTID;  Surgeon: Celso College, MD;  Location: [facility] ORS;  Service: Vascular;  Laterality: Right;   ESOPHAGOGASTRODUODENOSCOPY N/A 07/29/2021   Procedure: ESOPHAGOGASTRODUODENOSCOPY (EGD);  Surgeon: Toledo, Alphonsus Jeans, MD;  Location: [facility] ENDOSCOPY;  Service: Gastroenterology;  Laterality: N/A;  DM   HIP ARTHROPLASTY Right 09/24/2015   Procedure: ARTHROPLASTY BIPOLAR HIP (HEMIARTHROPLASTY);  Surgeon: Elner Hahn, MD;  Location: [facility] ORS;  Service: Orthopedics;  Laterality: Right;   HIP ARTHROPLASTY Left 09/29/2018   Procedure: ARTHROPLASTY BIPOLAR HIP (HEMIARTHROPLASTY) LEFT;  Surgeon: Elner Hahn, MD;  Location: [facility] ORS;  Service: Orthopedics;  Laterality: Left;   KNEE ARTHROSCOPY W/ AUTOGENOUS CARTILAGE IMPLANTATION (ACI) PROCEDURE     TOTAL KNEE ARTHROPLASTY Left 12/01/2017   Procedure: TOTAL KNEE ARTHROPLASTY;  Surgeon: Elner Hahn, MD;  Location: [facility] ORS;  Service: Orthopedics;  Laterality: Left;   TOTAL KNEE ARTHROPLASTY Right 2014   VARICOSE VEIN SURGERY Left  leg    Medications Prior to Admission  Medication Sig Dispense Refill Last Dose/Taking   acetaminophen  (TYLENOL ) 500 MG tablet Take 500 mg by mouth every 6 (six) hours as needed for mild pain (pain score 1-3).   Taking As Needed   azelastine (OPTIVAR) 0.05 % ophthalmic solution Place 1 drop into both eyes daily as needed (allergies).   Taking As Needed   busPIRone  (BUSPAR ) 10 MG tablet Take 10 mg by mouth 2 (two) times daily.   01/29/2024   carvedilol  (COREG )  6.25 MG tablet Take 6.25 mg by mouth 2 (two) times daily with a meal.   01/29/2024   cyclobenzaprine  (FLEXERIL ) 5 MG tablet Take 1 tablet (5 mg total) by mouth 3 (three) times daily as needed for muscle spasms.   Taking As Needed   furosemide  (LASIX ) 40 MG tablet Take 1 tablet (40 mg total) by mouth daily.   Past Week   Iron -Vitamin C  (VITRON-C) 65-125 MG TABS Take 1 tablet by mouth daily. 90 tablet 1 Past Week   levothyroxine  (SYNTHROID , LEVOTHROID) 75 MCG tablet Take 75 mcg by mouth daily.    01/29/2024   losartan  (COZAAR ) 25 MG tablet Take 1 tablet by mouth daily.   Taking   metFORMIN  (GLUCOPHAGE -XR) 500 MG 24 hr tablet Take 500 mg by mouth daily. With dinner   01/29/2024   pantoprazole  (PROTONIX ) 20 MG tablet Take 20 mg by mouth daily.   01/29/2024   PARoxetine  (PAXIL ) 40 MG tablet Take 40 mg by mouth every evening.   01/29/2024   potassium chloride  SA (KLOR-CON  M) 20 MEQ tablet Take 20 mEq by mouth 2 (two) times daily.  Take 1 tablet (20 mEq total) by mouth 2 (two) times daily With lasix    01/29/2024   rosuvastatin  (CRESTOR ) 5 MG tablet Take 1 tablet (5 mg total) by mouth daily. 30 tablet 0 Past Week   vitamin B-12 (CYANOCOBALAMIN ) 500 MCG tablet Take 500 mcg by mouth every other day.   01/29/2024   apixaban  (ELIQUIS ) 5 MG TABS tablet Take 1 tablet (5 mg total) by mouth 2 (two) times daily. (Patient not taking: Reported on 01/30/2024)   Not Taking   carvedilol  (COREG ) 25 MG tablet Take 2 tablets (50 mg total) by mouth 2 (two) times daily with a meal. (Patient not taking: Reported on 01/30/2024)   Not Taking   carvedilol  (COREG ) 25 MG tablet Take 25 mg by mouth 2 (two) times daily with a meal. (Patient not taking: Reported on 01/30/2024)   Not Taking   clopidogrel  (PLAVIX ) 75 MG tablet Take 75 mg by mouth daily. (Patient not taking: Reported on 01/30/2024)   Not Taking   digoxin  (LANOXIN ) 0.125 MG tablet Take 1 tablet (0.125 mg total) by mouth daily. (Patient not taking: Reported on 01/30/2024)   Not Taking    irbesartan  (AVAPRO ) 150 MG tablet Take 1 tablet (150 mg total) by mouth daily. (Patient not taking: Reported on 01/30/2024)   Not Taking   traZODone  (DESYREL ) 50 MG tablet Take 0.5 tablets (25 mg total) by mouth at bedtime as needed for sleep. (Patient not taking: Reported on 01/30/2024)   Not Taking   Vibegron  (GEMTESA ) 75 MG TABS Take 1 tablet (75 mg total) by mouth daily at 6 (six) AM. (Patient not taking: Reported on 01/30/2024)   Not Taking   Social History   Socioeconomic History   Marital status: Widowed    Spouse name: Not on file   Number of children: Not on file  Years of education: Not on file   Highest education level: Not on file  Occupational History   Occupation: retired  Tobacco Use   Smoking status: Never   Smokeless tobacco: Never  Vaping Use   Vaping status: Never Used  Substance and Sexual Activity   Alcohol  use: No    Alcohol /week: 0.0 standard drinks of alcohol    Drug use: No   Sexual activity: Not Currently  Other Topics Concern   Not on file  Social History Narrative   Lives with daughter   Social Drivers of Health   Financial Resource Strain: Low Risk  (09/28/2023)   Received from [facility]   Overall Financial Resource Strain (CARDIA)    Difficulty of Paying Living Expenses: Not hard at all  Food Insecurity: No Food Insecurity (01/30/2024)   Hunger Vital Sign    Worried About Running Out of Food in the Last Year: Never true    Ran Out of Food in the Last Year: Never true  Transportation Needs: No Transportation Needs (01/30/2024)   PRAPARE - Administrator, Civil Service (Medical): No    Lack of Transportation (Non-Medical): No  Physical Activity: Not on file  Stress: Not on file  Social Connections: Moderately Isolated (01/30/2024)   Social Connection and Isolation Panel    Frequency of Communication with Friends and Family: More than three times a week    Frequency of Social Gatherings with Friends and Family: More than  three times a week    Attends Religious Services: 1 to 4 times per year    Active Member of [employer] or Organizations: No    Attends Banker Meetings: Never    Marital Status: Widowed  Intimate Partner Violence: Not At Risk (01/30/2024)   Humiliation, Afraid, Rape, and Kick questionnaire    Fear of Current or Ex-Partner: No    Emotionally Abused: No    Physically Abused: No    Sexually Abused: No    Family History  Problem Relation Age of Onset   Kidney disease Brother        also nephew   Heart disease Mother    Heart disease Father    Prostate cancer Neg Hx    Bladder Cancer Neg Hx    Breast cancer Neg Hx    Kidney cancer Neg Hx      Vitals:   02/06/24 2357 02/07/24 0331 02/07/24 0500 02/07/24 0834  BP: 131/60 127/67  (!) 146/72  Pulse: (!) 55 67  87  Resp: 18 20    Temp: 98.2 F (36.8 C) 98.5 F (36.9 C)  98.7 F (37.1 C)  TempSrc:      SpO2: 94% 95%  95%  Weight:   60.6 kg   Height:        PHYSICAL EXAM General: well appearing elderly female, well nourished, in no acute distress. HEENT: Normocephalic and atraumatic. Neck: No JVD.   Lungs: Normal respiratory effort on 2L [place]. Diminished breath sounds bilaterally.  Heart: Irregularly, irregular, rate controlled. Normal S1 and S2 without gallops or murmurs.  Abdomen: Non-distended appearing.  Msk: Normal strength and tone for age. Extremities: Warm and well perfused. No clubbing, cyanosis. No edema.  Neuro: Alert and oriented X 3. Psych: Answers questions appropriately.   Labs: Basic Metabolic Panel: Recent Labs    02/06/24 0403 02/07/24 0333 02/07/24 0732  NA 139  --  138  K 3.2*  --  4.4  CL 88*  --  90*  CO2 41*  --  37*  GLUCOSE 111*  --  125*  BUN 16  --  15  CREATININE 0.52  --  0.61  CALCIUM  9.4  --  9.4  MG 1.9 2.1  --    Liver Function Tests: No results for input(s): AST, ALT, ALKPHOS, BILITOT, PROT, ALBUMIN in the last 72 hours.  No results for input(s): LIPASE,  AMYLASE in the last 72 hours. CBC: No results for input(s): WBC, NEUTROABS, HGB, HCT, MCV, PLT in the last 72 hours.  Cardiac Enzymes: No results for input(s): CKTOTAL, CKMB, CKMBINDEX, TROPONINIHS in the last 72 hours.  BNP: No results for input(s): BNP in the last 72 hours.  D-Dimer: No results for input(s): DDIMER in the last 72 hours. Hemoglobin A1C: No results for input(s): HGBA1C in the last 72 hours.  Fasting Lipid Panel: No results for input(s): CHOL, HDL, LDLCALC, TRIG, CHOLHDL, LDLDIRECT in the last 72 hours. Thyroid Function Tests: No results for input(s): TSH, T4TOTAL, T3FREE, THYROIDAB in the last 72 hours.  Invalid input(s): FREET3  Anemia Panel: No results for input(s): VITAMINB12, FOLATE, FERRITIN, TIBC, IRON , RETICCTPCT in the last 72 hours.   Radiology: [facility] Chest Port 1 View Result Date: 01/29/2024 CLINICAL DATA:  Shortness of breath EXAM: PORTABLE CHEST 1 VIEW COMPARISON:  01/08/2024 FINDINGS: Heart and mediastinal contours within normal limits. Patchy bilateral airspace opacities, right greater than left. No visible significant effusions. No acute bony abnormality. IMPRESSION: Patchy bilateral airspace opacities concerning for infection. Electronically Signed   By: Janeece Mechanic M.D.   On: 01/29/2024 17:31    ECHO 12/08/2023  1. Left ventricular ejection fraction, by estimation, is 60 to 65%. The left ventricle has normal function. The left ventricle has no regional wall motion abnormalities. Left ventricular diastolic parameters are indeterminate.   2. Right ventricular systolic function is normal. The right ventricular size is normal.   3. Left atrial size was mildly dilated.   4. Large pleural effusion.   5. The mitral valve is normal in structure. Trivial mitral valve regurgitation. No evidence of mitral stenosis.   6. The aortic valve is normal in structure. Aortic valve regurgitation is not  visualized. No aortic stenosis is present.   7. The inferior vena cava is normal in size with greater than 50% respiratory variability, suggesting right atrial pressure of 3 mmHg.   TELEMETRY reviewed by me 02/07/2024: atrial fibrillation, rate 70-80s  EKG reviewed by me: atrial fibrillation RVR, rate 135 bpm  Data reviewed by me 02/07/2024: last 24h vitals tele labs imaging I/O hospitalist progress note  Principal Problem:   CHF (congestive heart failure) (HCC) Active Problems:   Diabetes mellitus without complication (HCC)   Hypothyroidism   Asthma   Coronary artery disease involving native coronary artery of native heart   Renal artery stenosis (HCC)   Hypertension, renovascular   A-fib (HCC)   Malnutrition of moderate degree    ASSESSMENT AND PLAN:   Mary [name] is a 89 y.o. female  with a past medical history of paroxsymal atrial fibrillation (on Eliquis ), chronic HFpEF, coronary artery disease (on CT), hypertension, hyperlipidemia, T2DM, right carotid stenosis s/p CEA, pulmonary hypertension, COPD/asthma who presented to the ED on 01/29/2024 for shortness of breath, palpitations and lower abdominal pain. EKG in ED revealed AF RVR with rates 160s. Patient recently hospitalized on 04/16 with AF RVR and AoCHFpEF with similar presentation.  Cardiology was consulted for further evaluation.   # Paroxymal atrial fibrillation  Patient reports palpitations, SOB  for past 3 weeks and presents on admission with AF RVR in 160s. IV Caredizem gtt started in ED. Per tele patient remains in AF with  improved 70-80s. BP stable. TEE/DCCV with Dr. Bob Burn today (06/17). Patient converted to SR with rates 50s this afternoon. -Monitor and replenish electrolytes for a goal K >4, Mag >2  -Continue Eliquis  5 mg twice daily for stroke risk reduction.  -D/C PO digoxin  0.125 mg daily. -D/C 30 mg diltiazem  q6hrs. Uptitrate if BP allows.  -Continue PO amio 200 mg BID for 10 days, then 200 mg daily.  -Continue  metoprolol  succinate 100 mg daily.  # Acute on chronic HFpEF Patient presents with worsening SOB for past 3 weeks. CXR with patchy bilateral airspace opacities. BNP elevated at 820.Echo from 11/2023 with pEF (60-65%), no RWMA. -Continue IV lasix  40 mg BID. Plan to transition to PO tomorrow.  Closely monitor UOP, electrolytes, renal function. -Beta blocker as stated above. -Continue Farxiga 10 mg daily. (Per pharmacy copay [phone] - spoke to patient about cost and patient agreeable to starting medication).  -Ordered Spiro 12.5 mg daily.   # Coronary artery disease # Hypertension # Hyperlipidemia Patient denies chest pain. Troponin minimally elevated and flat 19 > 20.  EKG in ED with atrial fibrillation RVR, rate 135 bpm. -Continue Plavix  75 mg daily, rosuvastatin  5 mg daily.  -Beta blocker as stated above. -Ordered losartan  25 mg daily.  -Minimally elevated and flat trops in setting of AF RVR and AOCHFpEF is most consistent with demand/supply mismatch and not ACS   This patient's plan of care was discussed and created with Dr. Beau Bound and he is in agreement.  Signed: Creighton Doffing, PA-C  02/07/2024, 9:21 AM [facility]

## 2024-02-07 NOTE — Progress Notes (Signed)
 Patient stable. Bedside handoff given to [facility]

## 2024-02-07 NOTE — Transfer of Care (Signed)
 Immediate Anesthesia Transfer of Care Note  Patient: Mary [name]  Procedure(s) Performed: ECHOCARDIOGRAM, TRANSESOPHAGEAL CARDIOVERSION  Patient Location: PACU and Nursing Unit  Anesthesia Type:MAC  Level of Consciousness: drowsy  Airway & Oxygen Therapy: Patient Spontanous Breathing and Patient connected to nasal cannula oxygen  Post-op Assessment: Report given to RN and Post -op Vital signs reviewed and stable  Post vital signs: Reviewed and stable  Last Vitals:  Vitals Value Taken Time  BP 136/77   Temp    Pulse 50   Resp 16   SpO2 97     Last Pain:  Vitals:   02/07/24 1131  TempSrc: Oral  PainSc: 0-No pain      Patients Stated Pain Goal: 0 (01/30/24 2016)  Complications: No notable events documented.

## 2024-02-07 NOTE — Procedures (Signed)
 Electrical Cardioversion Procedure Note  Indication: Atrial fibrillation  Procedure Details: Consent: Indication, Risk/benefits of procedure as well as the alternatives explained to patient and informed consent obtained. Time out performed. Verified patient identification, verified procedure, verified correct patient position, special equipment/implants available, medications/allergies/relevent history reviewed, required imaging and test results reviewed.  Deep sedation was provided by anesthesia with propofol. Patient was delivered with 200 Joules of electricity X 1 with success to Sinus rhythm. Patient tolerated the procedure well. No immediate complication noted.   Successful cardioversion  Windell Norfolk, MD [facility] Card[facility]- [facility]

## 2024-02-07 NOTE — Plan of Care (Signed)
  Problem: Education: Goal: Ability to describe self-care measures that may prevent or decrease complications (Diabetes Survival Skills Education) will improve Outcome: Progressing Goal: Individualized Educational Video(s) Outcome: Progressing   Problem: Coping: Goal: Ability to adjust to condition or change in health will improve Outcome: Progressing   Problem: Fluid Volume: Goal: Ability to maintain a balanced intake and output will improve Outcome: Progressing   Problem: Health Behavior/Discharge Planning: Goal: Ability to identify and utilize available resources and services will improve Outcome: Progressing Goal: Ability to manage health-related needs will improve Outcome: Progressing   Problem: Metabolic: Goal: Ability to maintain appropriate glucose levels will improve Outcome: Progressing   Problem: Nutritional: Goal: Maintenance of adequate nutrition will improve Outcome: Progressing Goal: Progress toward achieving an optimal weight will improve Outcome: Progressing   Problem: Skin Integrity: Goal: Risk for impaired skin integrity will decrease Outcome: Progressing   Problem: Tissue Perfusion: Goal: Adequacy of tissue perfusion will improve Outcome: Progressing   Problem: Health Behavior/Discharge Planning: Goal: Ability to manage health-related needs will improve Outcome: Progressing   Problem: Clinical Measurements: Goal: Ability to maintain clinical measurements within normal limits will improve Outcome: Progressing Goal: Will remain free from infection Outcome: Progressing Goal: Diagnostic test results will improve Outcome: Progressing Goal: Respiratory complications will improve Outcome: Progressing Goal: Cardiovascular complication will be avoided Outcome: Progressing   Problem: Activity: Goal: Risk for activity intolerance will decrease Outcome: Progressing   Problem: Nutrition: Goal: Adequate nutrition will be maintained Outcome:  Progressing   Problem: Coping: Goal: Level of anxiety will decrease Outcome: Progressing   Problem: Elimination: Goal: Will not experience complications related to bowel motility Outcome: Progressing Goal: Will not experience complications related to urinary retention Outcome: Progressing   Problem: Pain Managment: Goal: General experience of comfort will improve and/or be controlled Outcome: Progressing   Problem: Safety: Goal: Ability to remain free from injury will improve Outcome: Progressing   Problem: Skin Integrity: Goal: Risk for impaired skin integrity will decrease Outcome: Progressing   Problem: Education: Goal: Ability to demonstrate management of disease process will improve Outcome: Progressing Goal: Ability to verbalize understanding of medication therapies will improve Outcome: Progressing Goal: Individualized Educational Video(s) Outcome: Progressing   Problem: Activity: Goal: Capacity to carry out activities will improve Outcome: Progressing   Problem: Cardiac: Goal: Ability to achieve and maintain adequate cardiopulmonary perfusion will improve Outcome: Progressing   Problem: Education: Goal: Knowledge of disease or condition will improve Outcome: Progressing Goal: Understanding of medication regimen will improve Outcome: Progressing Goal: Individualized Educational Video(s) Outcome: Progressing   Problem: Activity: Goal: Ability to tolerate increased activity will improve Outcome: Progressing   Problem: Cardiac: Goal: Ability to achieve and maintain adequate cardiopulmonary perfusion will improve Outcome: Progressing   Problem: Health Behavior/Discharge Planning: Goal: Ability to safely manage health-related needs after discharge will improve Outcome: Progressing

## 2024-02-07 NOTE — Progress Notes (Signed)
 Echocardiogram Echocardiogram Transesophageal has been performed.  Mary [name] 02/07/2024, 12:57 PM

## 2024-02-08 ENCOUNTER — Encounter: Payer: Self-pay | Admitting: Cardiology

## 2024-02-08 DIAGNOSIS — I4891 Unspecified atrial fibrillation: Secondary | ICD-10-CM

## 2024-02-08 LAB — CBC
HCT: 40 % (ref 36.0–46.0)
Hemoglobin: 12.4 g/dL (ref 12.0–15.0)
MCH: 31 pg (ref 26.0–34.0)
MCHC: 31 g/dL (ref 30.0–36.0)
MCV: 100 fL (ref 80.0–100.0)
Platelets: 245 10*3/uL (ref 150–400)
RBC: 4 MIL/uL (ref 3.87–5.11)
RDW: 13.9 % (ref 11.5–15.5)
WBC: 11.3 10*3/uL — ABNORMAL HIGH (ref 4.0–10.5)
nRBC: 0 % (ref 0.0–0.2)

## 2024-02-08 LAB — BASIC METABOLIC PANEL WITH GFR
Anion gap: 14 (ref 5–15)
BUN: 24 mg/dL — ABNORMAL HIGH (ref 8–23)
CO2: 34 mmol/L — ABNORMAL HIGH (ref 22–32)
Calcium: 9.7 mg/dL (ref 8.9–10.3)
Chloride: 88 mmol/L — ABNORMAL LOW (ref 98–111)
Creatinine, Ser: 0.84 mg/dL (ref 0.44–1.00)
GFR, Estimated: >60 mL/min (ref >60)
Glucose, Bld: 126 mg/dL — ABNORMAL HIGH (ref 70–99)
Potassium: 4 mmol/L (ref 3.5–5.1)
Sodium: 136 mmol/L (ref 135–145)

## 2024-02-08 LAB — GLUCOSE, CAPILLARY
Glucose-Capillary: 150 mg/dL — ABNORMAL HIGH (ref 70–99)
Glucose-Capillary: 196 mg/dL — ABNORMAL HIGH (ref 70–99)
Glucose-Capillary: 194 mg/dL — ABNORMAL HIGH (ref 70–99)
Glucose-Capillary: 148 mg/dL — ABNORMAL HIGH (ref 70–99)

## 2024-02-08 LAB — MAGNESIUM: Magnesium: 2.1 mg/dL (ref 1.7–2.4)

## 2024-02-08 MED ORDER — METOPROLOL SUCCINATE ER 50 MG PO TB24: 75.0000 mg | ORAL_TABLET | Freq: Every day | ORAL | Status: DC

## 2024-02-08 MED ORDER — METOPROLOL SUCCINATE ER 50 MG PO TB24
50.0000 mg | ORAL_TABLET | Freq: Every day | ORAL | Status: DC
  Administered 2024-02-08 – 2024-02-12 (×5): 50 mg via ORAL
  Filled 2024-02-08 (×6): qty 1

## 2024-02-08 MED ORDER — HYDRALAZINE HCL 20 MG/ML IJ SOLN: 10.0000 mg | Freq: Four times a day (QID) | INTRAMUSCULAR | Status: DC | PRN

## 2024-02-08 MED ORDER — LOSARTAN POTASSIUM 50 MG PO TABS
50.0000 mg | ORAL_TABLET | Freq: Every day | ORAL | Status: DC
  Administered 2024-02-08: 50 mg via ORAL
  Filled 2024-02-08: qty 1

## 2024-02-08 MED ORDER — SPIRONOLACTONE 25 MG PO TABS
25.0000 mg | ORAL_TABLET | Freq: Every day | ORAL | Status: DC
  Administered 2024-02-08 – 2024-02-13 (×6): 25 mg via ORAL
  Filled 2024-02-08 (×6): qty 1

## 2024-02-08 MED ORDER — FUROSEMIDE 40 MG PO TABS
40.0000 mg | ORAL_TABLET | Freq: Every day | ORAL | Status: DC
  Administered 2024-02-08 – 2024-02-13 (×6): 40 mg via ORAL
  Filled 2024-02-08 (×6): qty 1

## 2024-02-08 NOTE — Progress Notes (Signed)
SATURATION QUALIFICATIONS: (This note is used to comply with regulatory documentation for home oxygen)  Patient Saturations on Room Air at Rest = 89%  Patient Saturations on Room Air while Ambulating = 85%  Patient Saturations on 2 Liters of oxygen while Ambulating = 93%  Please briefly explain why patient needs home oxygen: 

## 2024-02-08 NOTE — Progress Notes (Addendum)
 Physical Therapy Treatment Patient Details Name: Mary [name] MRN: [id number] DOB: 1934/10/20 Today's Date: 02/08/2024   History of Present Illness Mary [name] is an 88yoF admitted for AF c RVR, hypokalemia, & hypomagnesemia. PMH significant for PAF on Eliquis , HTN, CAD, asthma/COPD, chronic HFpEF, HLD, RA, sleep apnea, spinal stenosis. Recently returned home from STR 5 days ago.    PT Comments  Pt in be don entry, 2L/min, has been bradycardic at rest for most of day, rates in mid 40s. Pt agreeable to session, but is limited somewhat by her acute on chronic severe neck pain. MinA to EOB, minA to rise from standard surface height. Pt AMB around bed and back on 2L with sats at 80%, but after recovery on 4L and repeat effort on 4L, sats at 98% after 2nd AMB bout. Pt says neck pain is also limiting her ability to perform AMB. HR does not exceed 59bpm in session. Pt sat up in chair with lunch tray at end of session. As mobility has not improved much since admission >1 week ago, recommendations for PT services seem more appropriate for STR at DC. This will ensure fastest recovery of function upon DC. I have consulted OT as well. Will continue to follow.     If plan is discharge home, recommend the following: A little help with walking and/or transfers;A little help with bathing/dressing/bathroom;Assist for transportation;Help with stairs or ramp for entrance   Can travel by private vehicle        Equipment Recommendations  None recommended by PT    Recommendations for Other Services       Precautions / Restrictions Precautions Precautions: Fall Recall of Precautions/Restrictions: Intact Restrictions Weight Bearing Restrictions Per Provider Order: No     Mobility  Bed Mobility Overal bed mobility: Modified Independent Bed Mobility: Supine to Sit     Supine to sit: Min assist     General bed mobility comments: provided assist as acute on chronic neck pain flare is creating more poor  tolerance today    Transfers Overall transfer level: Needs assistance Equipment used: None, 1 person hand held assist Transfers: Sit to/from Stand Sit to Stand: Min assist           General transfer comment: a bit weaker from standard surface height, needs minA to rise each time    Ambulation/Gait Ambulation/Gait assistance: Contact guard assist Gait Distance (Feet): 28 Feet Assistive device: None (bed rail)         General Gait Details: Pt feels SOB with each effort, improved with move from 2L to 4L (neck pain also quite limiting): HR remains bradycardic   Stairs             Wheelchair Mobility     Tilt Bed    Modified Rankin (Stroke Patients Only)       Balance                                            Communication    Cognition                                        Cueing    Exercises      General Comments        Pertinent Vitals/Pain Pain Assessment Pain Assessment: Faces  Faces Pain Scale: Hurts even more Pain Location: neck pain sharp and cramping Pain Intervention(s): Limited activity within patient's tolerance, Monitored during session, Premedicated before session    Home Living                          Prior Function            PT Goals (current goals can now be found in the care plan section) Acute Rehab PT Goals Patient Stated Goal: return home PT Goal Formulation: With patient Time For Goal Achievement: 02/13/24 Potential to Achieve Goals: Fair Progress towards PT goals: Not progressing toward goals - comment    Frequency    Min 2X/week      PT Plan      Co-evaluation              AM-PAC PT 6 Clicks Mobility   Outcome Measure  Help needed turning from your back to your side while in a flat bed without using bedrails?: A Lot Help needed moving from lying on your back to sitting on the side of a flat bed without using bedrails?: A Lot Help needed moving  to and from a bed to a chair (including a wheelchair)?: A Lot Help needed standing up from a chair using your arms (e.g., wheelchair or bedside chair)?: A Lot Help needed to walk in hospital room?: A Little Help needed climbing 3-5 steps with a railing? : A Little 6 Click Score: 14    End of Session Equipment Utilized During Treatment: Oxygen Activity Tolerance: Patient tolerated treatment well;No increased pain;Patient limited by fatigue Patient left: in chair;with call bell/phone within reach;with chair alarm set Nurse Communication: Mobility status PT Visit Diagnosis: Muscle weakness (generalized) (M62.81)     Time: 8119-1478 PT Time Calculation (min) (ACUTE ONLY): 17 min  Charges:    $Therapeutic Activity: 8-22 mins PT General Charges $$ ACUTE PT VISIT: 1 Visit                    2:20 PM, 02/08/24 Dawn Eth, PT, DPT Physical Therapist - [facility]  [phone] (ASCOM)    Mary [name] C 02/08/2024, 2:17 PM

## 2024-02-08 NOTE — Plan of Care (Signed)

## 2024-02-08 NOTE — Progress Notes (Signed)
  PROGRESS NOTE    Mary [name]  XBM:[id number] DOB: July 10, 1935 DOA: 01/29/2024 PCP: Monique Ano, MD  [id number]  LOS: 10 days   Brief hospital course:   Assessment & Plan: Mary [name] is a 89 y.o. female with medical history significant of coronary disease, hypertension, paroxysmal atrial fibrillation on anticoagulation with Eliquis , HFpEF, asthma/COPD as a result of secondhand smoking, hypothyroidism She presents to the emergency room with multiple complaints that include palpitations, associated shortness of breath and lower abdominal pain and spasms.  She felt her lower abdominal symptoms were likely attributed to urinary tract infection.  She denies any fever or chills.   In the ER, patient was noted to be in A-fib with RVR.  Heart rates in the 160s.  Patient required oxygen supplementation in the ED.  Chest x-ray did reveal some vascular congestion.   A-fib: with RVR.  S/p cardioversion on 02/07/24 in which pt converted to NSR.  D/c'ed digoxin  & diltiazem  as per cardio.  --cont oral amio with loading --cont Toprol  --cont Eliquis    Chronic diastolic CHF:  --cont oral lasix , losartan , Toprol  and spironolactone   Pulmonary infiltrates: likely pneumonia. Completed abx course. Bronchodilators prn. Encourage incentive spirometry    Acute hypoxic respiratory failure:  Unable to wean from supplemental oxygen so far  --will d/c home with 2L home O2  Hypokalemia: --monitor and supplement PRN   History recurrent UTIs: urine cx growing multiple species. C/o dysuria. ? acute UTI. Completed abx course    Vaginal yeast infection: completed fluconazole course x 2 doses. Continue w/ mycolog cream    Hypothyroidism: continue on levothyroxine     DM2: fair control, HbA1c 7.0.  --ACHS and SSI   HTN:  --cont oral lasix , losartan , Toprol  and spironolactone  HLD: continue on statin     GAD: severity unknown.  --cont Buspar  and Paxil    Debility: PT/OT recs HH    DVT  prophylaxis: On:Eliquis  Code Status: DNR  Family Communication:  Level of care: Progressive Dispo:   The patient is from: home Anticipated d/c is to: home Anticipated d/c date is: tomorrow   Subjective and Interval History:  Pt reported left neck muscle spasm causing pain.   Objective: Vitals:   02/08/24 0746 02/08/24 1149 02/08/24 1731 02/08/24 1945  BP: (!) 135/59 137/63 (!) 153/56 (!) 186/71  Pulse: (!) 50 (!) 44 (!) 51 (!) 50  Resp: 18 18  18   Temp: 98.6 F (37 C) 98 F (36.7 C) 99.5 F (37.5 C) 99.5 F (37.5 C)  TempSrc:   Oral Oral  SpO2: 90% 97% 98% 98%  Weight:      Height:        Intake/Output Summary (Last 24 hours) at 02/08/2024 1947 Last data filed at 02/08/2024 0900 Gross per 24 hour  Intake 220 ml  Output --  Net 220 ml   Filed Weights   02/06/24 0500 02/07/24 0500 02/08/24 0500  Weight: 63.2 kg 60.6 kg 60.9 kg    Examination:   Constitutional: NAD, AAOx3 HEENT: conjunctivae and lids normal, EOMI CV: No cyanosis.   RESP: normal respiratory effort Neuro: II - XII grossly intact.   Psych: Normal mood and affect.  Appropriate judgement and reason   Data Reviewed: I have personally reviewed labs and imaging studies  Time spent: 50 minutes  Garrison Kanner, MD [facility] If 7PM-7AM, please contact night-coverage 02/08/2024, 7:47 PM

## 2024-02-08 NOTE — Progress Notes (Signed)
 [facility] CLINIC CARDIOLOGY PROGRESS NOTE       [name] MRN: [id number] DOB/AGE: 05/20/1935 89 y.o.  Admit date: 01/29/2024 Referring Physician Dr. Hines Ludwig Primary Physician Monique Ano, MD Primary Cardiologist Dr. Larinda Plover Reason for Consultation AF RVR  HPI: Mary [name] is a 89 y.o. female  with a past medical history of paroxsymal atrial fibrillation (on Eliquis ), chronic HFpEF, coronary artery disease (on CT), hypertension, hyperlipidemia, T2DM, right carotid stenosis s/p CEA, pulmonary hypertension, COPD/asthma who presented to the ED on 01/29/2024 for shortness of breath, palpitations and lower abdominal pain. EKG in ED revealed AF RVR with rates 160s. Cardiology was consulted for further evaluation.   Interval History: -Patient seen and examined this AM and laying comfortably in hospital bed. Patient states she feels good this AM. Denies chest pain or palpitations.   -Patients BP elevated and HR stable.  -Per tele remains in SR, rate 50s (s/p TEE/DCCV on 06/17) -Patient remains on 2L  with stable SpO2.    Review of systems complete and found to be negative unless listed above    Past Medical History:  Diagnosis Date   Acid reflux    Anemia    Arthritis    Asthma    Atherosclerosis of abdominal aorta (HCC)    B12 deficiency    Clostridium difficile infection    H/O   Coronary artery disease    DDD (degenerative disc disease), lumbar    Depression    Diabetes (HCC)    type 2   Diabetic retinopathy (HCC)    Dysrhythmia    Heart murmur    HLD (hyperlipidemia)    HTN (hypertension)    Hypotension    when get up too quickly   Hypothyroidism    Pneumonia    PONV (postoperative nausea and vomiting)    Pulmonary nodule    right middle lobe on CT scan   Pure hypercholesterolemia    RA (rheumatoid arthritis) (HCC)    Sleep apnea    Spinal stenosis    Urinary urgency     Past Surgical History:  Procedure Laterality Date   ABDOMINAL  HYSTERECTOMY     CATARACT EXTRACTION, BILATERAL     COLONOSCOPY N/A 07/29/2021   Procedure: COLONOSCOPY;  Surgeon: Toledo, Alphonsus Jeans, MD;  Location: [facility] ENDOSCOPY;  Service: Gastroenterology;  Laterality: N/A;   CYSTOSCOPY     ENDARTERECTOMY Right 12/02/2021   Procedure: ENDARTERECTOMY CAROTID;  Surgeon: Celso College, MD;  Location: [facility] ORS;  Service: Vascular;  Laterality: Right;   ESOPHAGOGASTRODUODENOSCOPY N/A 07/29/2021   Procedure: ESOPHAGOGASTRODUODENOSCOPY (EGD);  Surgeon: Toledo, Alphonsus Jeans, MD;  Location: [facility] ENDOSCOPY;  Service: Gastroenterology;  Laterality: N/A;  DM   HIP ARTHROPLASTY Right 09/24/2015   Procedure: ARTHROPLASTY BIPOLAR HIP (HEMIARTHROPLASTY);  Surgeon: Elner Hahn, MD;  Location: [facility] ORS;  Service: Orthopedics;  Laterality: Right;   HIP ARTHROPLASTY Left 09/29/2018   Procedure: ARTHROPLASTY BIPOLAR HIP (HEMIARTHROPLASTY) LEFT;  Surgeon: Elner Hahn, MD;  Location: [facility] ORS;  Service: Orthopedics;  Laterality: Left;   KNEE ARTHROSCOPY W/ AUTOGENOUS CARTILAGE IMPLANTATION (ACI) PROCEDURE     TOTAL KNEE ARTHROPLASTY Left 12/01/2017   Procedure: TOTAL KNEE ARTHROPLASTY;  Surgeon: Elner Hahn, MD;  Location: [facility] ORS;  Service: Orthopedics;  Laterality: Left;   TOTAL KNEE ARTHROPLASTY Right 2014   VARICOSE VEIN SURGERY Left    leg    Medications Prior to Admission  Medication Sig Dispense Refill Last Dose/Taking   acetaminophen  (TYLENOL ) 500 MG tablet  Take 500 mg by mouth every 6 (six) hours as needed for mild pain (pain score 1-3).   Taking As Needed   azelastine (OPTIVAR) 0.05 % ophthalmic solution Place 1 drop into both eyes daily as needed (allergies).   Taking As Needed   busPIRone  (BUSPAR ) 10 MG tablet Take 10 mg by mouth 2 (two) times daily.   01/29/2024   carvedilol  (COREG ) 6.25 MG tablet Take 6.25 mg by mouth 2 (two) times daily with a meal.   01/29/2024   cyclobenzaprine  (FLEXERIL ) 5 MG tablet Take 1 tablet (5 mg total) by mouth 3 (three) times daily as  needed for muscle spasms.   Taking As Needed   furosemide  (LASIX ) 40 MG tablet Take 1 tablet (40 mg total) by mouth daily.   Past Week   Iron -Vitamin C  (VITRON-C) 65-125 MG TABS Take 1 tablet by mouth daily. 90 tablet 1 Past Week   levothyroxine  (SYNTHROID , LEVOTHROID) 75 MCG tablet Take 75 mcg by mouth daily.    01/29/2024   losartan  (COZAAR ) 25 MG tablet Take 1 tablet by mouth daily.   Taking   metFORMIN  (GLUCOPHAGE -XR) 500 MG 24 hr tablet Take 500 mg by mouth daily. With dinner   01/29/2024   pantoprazole  (PROTONIX ) 20 MG tablet Take 20 mg by mouth daily.   01/29/2024   PARoxetine  (PAXIL ) 40 MG tablet Take 40 mg by mouth every evening.   01/29/2024   potassium chloride  SA (KLOR-CON  M) 20 MEQ tablet Take 20 mEq by mouth 2 (two) times daily.  Take 1 tablet (20 mEq total) by mouth 2 (two) times daily With lasix    01/29/2024   rosuvastatin  (CRESTOR ) 5 MG tablet Take 1 tablet (5 mg total) by mouth daily. 30 tablet 0 Past Week   vitamin B-12 (CYANOCOBALAMIN ) 500 MCG tablet Take 500 mcg by mouth every other day.   01/29/2024   apixaban  (ELIQUIS ) 5 MG TABS tablet Take 1 tablet (5 mg total) by mouth 2 (two) times daily. (Patient not taking: Reported on 01/30/2024)   Not Taking   carvedilol  (COREG ) 25 MG tablet Take 2 tablets (50 mg total) by mouth 2 (two) times daily with a meal. (Patient not taking: Reported on 01/30/2024)   Not Taking   carvedilol  (COREG ) 25 MG tablet Take 25 mg by mouth 2 (two) times daily with a meal. (Patient not taking: Reported on 01/30/2024)   Not Taking   clopidogrel  (PLAVIX ) 75 MG tablet Take 75 mg by mouth daily. (Patient not taking: Reported on 01/30/2024)   Not Taking   digoxin  (LANOXIN ) 0.125 MG tablet Take 1 tablet (0.125 mg total) by mouth daily. (Patient not taking: Reported on 01/30/2024)   Not Taking   irbesartan  (AVAPRO ) 150 MG tablet Take 1 tablet (150 mg total) by mouth daily. (Patient not taking: Reported on 01/30/2024)   Not Taking   traZODone  (DESYREL ) 50 MG tablet Take 0.5 tablets (25  mg total) by mouth at bedtime as needed for sleep. (Patient not taking: Reported on 01/30/2024)   Not Taking   Vibegron  (GEMTESA ) 75 MG TABS Take 1 tablet (75 mg total) by mouth daily at 6 (six) AM. (Patient not taking: Reported on 01/30/2024)   Not Taking   Social History   Socioeconomic History   Marital status: Widowed    Spouse name: Not on file   Number of children: Not on file   Years of education: Not on file   Highest education level: Not on file  Occupational History   Occupation: retired  Tobacco Use   Smoking status: Never   Smokeless tobacco: Never  Vaping Use   Vaping status: Never Used  Substance and Sexual Activity   Alcohol  use: No    Alcohol /week: 0.0 standard drinks of alcohol    Drug use: No   Sexual activity: Not Currently  Other Topics Concern   Not on file  Social History Narrative   Lives with daughter   Social Drivers of Health   Financial Resource Strain: Low Risk  (09/28/2023)   Received [facility]   Overall Financial Resource Strain (CARDIA)    Difficulty of Paying Living Expenses: Not hard at all  Food Insecurity: No Food Insecurity (01/30/2024)   Hunger Vital Sign    Worried About Running Out of Food in the Last Year: Never true    Ran Out of Food in the Last Year: Never true  Transportation Needs: No Transportation Needs (01/30/2024)   PRAPARE - Administrator, Civil Service (Medical): No    Lack of Transportation (Non-Medical): No  Physical Activity: Not on file  Stress: Not on file  Social Connections: Moderately Isolated (01/30/2024)   Social Connection and Isolation Panel    Frequency of Communication with Friends and Family: More than three times a week    Frequency of Social Gatherings with Friends and Family: More than three times a week    Attends Religious Services: 1 to 4 times per year    Active Member of [employer] or Organizations: No    Attends Banker Meetings: Never    Marital Status:  Widowed  Intimate Partner Violence: Not At Risk (01/30/2024)   Humiliation, Afraid, Rape, and Kick questionnaire    Fear of Current or Ex-Partner: No    Emotionally Abused: No    Physically Abused: No    Sexually Abused: No    Family History  Problem Relation Age of Onset   Kidney disease Brother        also nephew   Heart disease Mother    Heart disease Father    Prostate cancer Neg Hx    Bladder Cancer Neg Hx    Breast cancer Neg Hx    Kidney cancer Neg Hx      Vitals:   02/07/24 2240 02/08/24 0449 02/08/24 0500 02/08/24 0746  BP: (!) 159/64 (!) 143/54  (!) 135/59  Pulse: (!) 50 (!) 44  (!) 50  Resp: 18 17  18   Temp: 98.2 F (36.8 C) 98.4 F (36.9 C)  98.6 F (37 C)  TempSrc: Oral Oral    SpO2: 94% 97%  90%  Weight:   60.9 kg   Height:        PHYSICAL EXAM General: well appearing elderly female, well nourished, in no acute distress. HEENT: Normocephalic and atraumatic. Neck: No JVD.   Lungs: Normal respiratory effort on 2L [place]. Diminished breath sounds bilaterally.  Heart: Irregularly, irregular, rate controlled. Normal S1 and S2 without gallops or murmurs.  Abdomen: Non-distended appearing.  Msk: Normal strength and tone for age. Extremities: Warm and well perfused. No clubbing, cyanosis. No edema.  Neuro: Alert and oriented X 3. Psych: Answers questions appropriately.   Labs: Basic Metabolic Panel: Recent Labs    02/07/24 0333 02/07/24 0732 02/08/24 0346  NA  --  138 136  K  --  4.4 4.0  CL  --  90* 88*  CO2  --  37* 34*  GLUCOSE  --  125* 126*  BUN  --  15 24*  CREATININE  --  0.61 0.84  CALCIUM   --  9.4 9.7  MG 2.1  --  2.1   Liver Function Tests: No results for input(s): AST, ALT, ALKPHOS, BILITOT, PROT, ALBUMIN in the last 72 hours.  No results for input(s): LIPASE, AMYLASE in the last 72 hours. CBC: Recent Labs    02/08/24 0346  WBC 11.3*  HGB 12.4  HCT 40.0  MCV 100.0  PLT 245    Cardiac Enzymes: No results for  input(s): CKTOTAL, CKMB, CKMBINDEX, TROPONINIHS in the last 72 hours.  BNP: No results for input(s): BNP in the last 72 hours.  D-Dimer: No results for input(s): DDIMER in the last 72 hours. Hemoglobin A1C: No results for input(s): HGBA1C in the last 72 hours.  Fasting Lipid Panel: No results for input(s): CHOL, HDL, LDLCALC, TRIG, CHOLHDL, LDLDIRECT in the last 72 hours. Thyroid Function Tests: No results for input(s): TSH, T4TOTAL, T3FREE, THYROIDAB in the last 72 hours.  Invalid input(s): FREET3  Anemia Panel: No results for input(s): VITAMINB12, FOLATE, FERRITIN, TIBC, IRON , RETICCTPCT in the last 72 hours.   Radiology: ECHO TEE Result Date: 02/07/2024    TRANSESOPHOGEAL ECHO REPORT   Patient Name:   Mary [name] Date of Exam: 02/07/2024 Medical Rec #:  [id number]     Height:       65.0 in Accession #:    [id number]    Weight:       133.6 lb Date of Birth:  1935/07/15     BSA:          1.666 m Patient Age:    88 years      BP:           130/65 mmHg Patient Gender: F             HR:           82 bpm. Exam Location:  [facility] Procedure: Transesophageal Echo and Color Doppler (Both Spectral and Color Flow            Doppler were utilized during procedure). Indications:     Atrial fibrillation  History:         Patient has prior history of Echocardiogram examinations, most                  recent 12/08/2023. CHF, CAD, Arrythmias:Atrial Fibrillation,                  Signs/Symptoms:Shortness of Breath; Risk Factors:Hypertension,                  Diabetes and Dyslipidemia.  Sonographer:     Terrilee Few RCS Referring Phys:  [id number] Kurt Phi Haji Delaine Diagnosing Phys: Joetta Mustache PROCEDURE: After discussion of the risks and benefits of a TEE, an informed consent was obtained from the patient. The transesophogeal probe was passed without difficulty through the esophogus of the patient. Imaged were obtained with the patient in a left lateral decubitus  position. Local oropharyngeal anesthetic was provided with Cetacaine. Sedation performed by different physician. The patient was monitored while under deep sedation. Anesthestetic sedation was provided intravenously by Anesthesiology: 70mg  of Propofol . Image quality was excellent. The patient's vital signs; including heart rate, blood pressure, and oxygen saturation; remained stable throughout the procedure. The patient developed no complications during the procedure. A successful direct current cardioversion was performed at 200 joules with 1 attempt.  IMPRESSIONS  1. Left ventricular ejection fraction, by estimation, is 55 to 60%. The left  ventricle has normal function.  2. Right ventricular systolic function is normal. The right ventricular size is normal.  3. Left atrial size was dilated. No left atrial/left atrial appendage thrombus was detected.  4. The mitral valve is normal in structure. Trivial mitral valve regurgitation.  5. The aortic valve is tricuspid. Aortic valve regurgitation is not visualized. Conclusion(s)/Recommendation(s): No LA/LAA thrombus identified. Successful cardioversion performed with restoration of normal sinus rhythm. FINDINGS  Left Ventricle: Left ventricular ejection fraction, by estimation, is 55 to 60%. The left ventricle has normal function. The left ventricular internal cavity size was normal in size. Right Ventricle: The right ventricular size is normal. No increase in right ventricular wall thickness. Right ventricular systolic function is normal. Left Atrium: Left atrial size was dilated. No left atrial/left atrial appendage thrombus was detected. Right Atrium: Right atrial size was normal in size. Pericardium: There is no evidence of pericardial effusion. Mitral Valve: The mitral valve is normal in structure. Mild mitral annular calcification. Trivial mitral valve regurgitation. Tricuspid Valve: The tricuspid valve is normal in structure. Tricuspid valve regurgitation is mild.  Aortic Valve: The aortic valve is tricuspid. Aortic valve regurgitation is not visualized. Pulmonic Valve: The pulmonic valve was normal in structure. Pulmonic valve regurgitation is not visualized. Aorta: The aortic root is normal in size and structure. IAS/Shunts: No atrial level shunt detected by color flow Doppler. Additional Comments: Spectral Doppler performed. Joetta Mustache Electronically signed by Joetta Mustache Signature Date/Time: 02/07/2024/1:40:59 PM    Final    DG Chest Port 1 View Result Date: 01/29/2024 CLINICAL DATA:  Shortness of breath EXAM: PORTABLE CHEST 1 VIEW COMPARISON:  01/08/2024 FINDINGS: Heart and mediastinal contours within normal limits. Patchy bilateral airspace opacities, right greater than left. No visible significant effusions. No acute bony abnormality. IMPRESSION: Patchy bilateral airspace opacities concerning for infection. Electronically Signed   By: Janeece Mechanic M.D.   On: 01/29/2024 17:31    ECHO 12/08/2023  1. Left ventricular ejection fraction, by estimation, is 60 to 65%. The left ventricle has normal function. The left ventricle has no regional wall motion abnormalities. Left ventricular diastolic parameters are indeterminate.   2. Right ventricular systolic function is normal. The right ventricular size is normal.   3. Left atrial size was mildly dilated.   4. Large pleural effusion.   5. The mitral valve is normal in structure. Trivial mitral valve regurgitation. No evidence of mitral stenosis.   6. The aortic valve is normal in structure. Aortic valve regurgitation is not visualized. No aortic stenosis is present.   7. The inferior vena cava is normal in size with greater than 50% respiratory variability, suggesting right atrial pressure of 3 mmHg.   TELEMETRY reviewed by me 02/08/2024: sinus rhythm, rate 50s  EKG reviewed by me: atrial fibrillation RVR, rate 135 bpm  Data reviewed by me 02/08/2024: last 24h vitals tele labs imaging I/O hospitalist  progress note  Principal Problem:   CHF (congestive heart failure) (HCC) Active Problems:   Diabetes mellitus without complication (HCC)   Hypothyroidism   Asthma   Coronary artery disease involving native coronary artery of native heart   Renal artery stenosis (HCC)   Hypertension, renovascular   A-fib (HCC)   Malnutrition of moderate degree    ASSESSMENT AND PLAN:   Mary [name] is a 89 y.o. female  with a past medical history of paroxsymal atrial fibrillation (on Eliquis ), chronic HFpEF, coronary artery disease (on CT), hypertension, hyperlipidemia, T2DM, right carotid stenosis s/p CEA, pulmonary hypertension,  COPD/asthma who presented to the ED on 01/29/2024 for shortness of breath, palpitations and lower abdominal pain. EKG in ED revealed AF RVR with rates 160s. Patient recently hospitalized on 04/16 with AF RVR and AoCHFpEF with similar presentation.  Cardiology was consulted for further evaluation.   # Paroxymal atrial fibrillation  Patient reports palpitations, SOB for past 3 weeks and presents on admission with AF RVR in 160s. IV Caredizem gtt started in ED. Per tele patient remains in AF with  improved 70-80s. BP stable. TEE/DCCV with Dr. Bob Burn today (06/17). Patient converted to SR with rates 50s this afternoon. Per tele patient has remains in SR, rate 50s. -Monitor and replenish electrolytes for a goal K >4, Mag >2  -Continue Eliquis  5 mg twice daily for stroke risk reduction.  -Continue PO amio 200 mg BID for 10 days, then 200 mg daily.  -Decreased metoprolol  succinate to 50 mg daily.  # Acute on chronic HFpEF Patient presents with worsening SOB for past 3 weeks. CXR with patchy bilateral airspace opacities. BNP elevated at 820.Echo from 11/2023 with pEF (60-65%), no RWMA. -Transition to PO Lasix  40 mg daily.  Closely monitor UOP, electrolytes, renal function. -Beta blocker as stated above. -Continue Farxiga 10 mg daily. (Per pharmacy copay [phone] - spoke to patient about cost  and patient agreeable to starting medication).  -Increased Spiro to 25 mg daily.  -Increased losartan  to 50 mg daily.  # Coronary artery disease # Hypertension # Hyperlipidemia Patient denies chest pain. Troponin minimally elevated and flat 19 > 20.  EKG in ED with atrial fibrillation RVR, rate 135 bpm. -Continue Plavix  75 mg daily, rosuvastatin  5 mg daily.  -Beta blocker as stated above. -ARB as stated above. -Minimally elevated and flat trops in setting of AF RVR and AOCHFpEF is most consistent with demand/supply mismatch and not ACS   Recommend to ambulate patient prior to discharge. Ok for discharge today from a cardiac perspective. Will arrange for follow up in clinic with Dr. Larinda Plover in 1-2 weeks.   This patient's plan of care was discussed and created with Dr. Beau Bound and he is in agreement.  Signed: Creighton Doffing, PA-C  02/08/2024, 8:27 AM [facility] Cardiology

## 2024-02-09 DIAGNOSIS — I5033 Acute on chronic diastolic (congestive) heart failure: Secondary | ICD-10-CM | POA: Diagnosis not present

## 2024-02-09 LAB — GLUCOSE, CAPILLARY
Glucose-Capillary: 168 mg/dL — ABNORMAL HIGH (ref 70–99)
Glucose-Capillary: 300 mg/dL — ABNORMAL HIGH (ref 70–99)
Glucose-Capillary: 157 mg/dL — ABNORMAL HIGH (ref 70–99)
Glucose-Capillary: 118 mg/dL — ABNORMAL HIGH (ref 70–99)

## 2024-02-09 LAB — MAGNESIUM: Magnesium: 2 mg/dL (ref 1.7–2.4)

## 2024-02-09 MED ORDER — DAPAGLIFLOZIN PROPANEDIOL 10 MG PO TABS: 10.0000 mg | ORAL_TABLET | Freq: Every day | ORAL | 2 refills | Status: AC

## 2024-02-09 MED ORDER — SPIRONOLACTONE 25 MG PO TABS: 25.0000 mg | ORAL_TABLET | Freq: Every day | ORAL | 2 refills | Status: AC

## 2024-02-09 MED ORDER — APIXABAN 5 MG PO TABS: 5.0000 mg | ORAL_TABLET | Freq: Two times a day (BID) | ORAL | 2 refills | Status: AC

## 2024-02-09 MED ORDER — LOSARTAN POTASSIUM 100 MG PO TABS: 100.0000 mg | ORAL_TABLET | Freq: Every day | ORAL | 2 refills | Status: DC

## 2024-02-09 MED ORDER — AMIODARONE HCL 200 MG PO TABS: ORAL_TABLET | ORAL | 0 refills | Status: AC

## 2024-02-09 MED ORDER — FUROSEMIDE 40 MG PO TABS
40.0000 mg | ORAL_TABLET | Freq: Every day | ORAL | 2 refills | Status: AC
Start: 2024-02-09 — End: ?

## 2024-02-09 MED ORDER — LOSARTAN POTASSIUM 50 MG PO TABS
100.0000 mg | ORAL_TABLET | Freq: Every day | ORAL | Status: DC
  Administered 2024-02-09 – 2024-02-13 (×5): 100 mg via ORAL
  Filled 2024-02-09 (×5): qty 2

## 2024-02-09 MED ORDER — ADULT MULTIVITAMIN W/MINERALS CH: 1.0000 | ORAL_TABLET | Freq: Every day | ORAL | Status: AC

## 2024-02-09 MED ORDER — METOPROLOL SUCCINATE ER 50 MG PO TB24: 50.0000 mg | ORAL_TABLET | Freq: Every day | ORAL | 2 refills | Status: DC

## 2024-02-09 MED ORDER — ENSURE PLUS HIGH PROTEIN PO LIQD: 237.0000 mL | Freq: Two times a day (BID) | ORAL | Status: DC

## 2024-02-09 NOTE — TOC Progression Note (Signed)
 Transition of Care [facility]) - Progression Note    Patient Details  Name: Mary [name] MRN: [id number] Date of Birth: 09/10/1934  Transition of [facility] CM/SW Contact  Baird Bombard, RN Phone Number: 02/09/2024, 10:43 AM  Clinical Narrative:    Discharge disposition upgraded to SNF.   Spoke with patient's daughter, [name] regarding SNF. She is reluctant but agreeable to SNF. [name] stated she does not want patient to go to [facility] but is agreeable to other facilities in [facility].   Bed search started.            Expected Discharge Plan and Services         Expected Discharge Date: 02/09/24                                     Social Determinants of Health (SDOH) Interventions SDOH Screenings   Food Insecurity: No Food Insecurity (01/30/2024)  Housing: Low Risk  (01/30/2024)  Transportation Needs: No Transportation Needs (01/30/2024)  Utilities: Not At Risk (01/30/2024)  Financial Resource Strain: Low Risk  (09/28/2023)   Received from [facility]  Social Connections: Moderately Isolated (01/30/2024)  Tobacco Use: Low Risk  (01/30/2024)    Readmission Risk Interventions     No data to display

## 2024-02-09 NOTE — [place] FL2 (Signed)
   MEDICAID FL2 LEVEL OF CARE FORM     IDENTIFICATION  Patient Name: Mary [name] Birthdate: 1935-01-05 Sex: female Admission Date (Current Location): 01/29/2024  [facility] and [place] Number:  Chiropodist and Address:  [facility], [address] [place], [place] [address]      Provider Number: [id number]  Attending Physician Name and Address:  Garrison Kanner, MD  Relative Name and Phone Number:  Lerae, Langham (Daughter)  [phone] (Mobile)    Current Level of Care: Hospital Recommended Level of Care: Skilled Nursing Facility Prior Approval Number:    Date Approved/Denied:   PASRR Number: [id number]  Discharge Plan: SNF    Current Diagnoses: Patient Active Problem List   Diagnosis Date Noted   Malnutrition of moderate degree 01/31/2024   Atrial fibrillation with rapid ventricular response (HCC) 01/08/2024   Afib (HCC) 01/07/2024   A-fib (HCC) 12/07/2023   CHF (congestive heart failure) (HCC) 12/07/2023   Left flank pain 07/25/2023   Hydronephrosis of left kidney 07/25/2023   Hypokalemia 07/24/2023   UTI (urinary tract infection) 07/23/2023   Renal artery stenosis (HCC) 11/12/2022   Hypertension, renovascular 11/12/2022   Osteoarthritis of carpometacarpal (CMC) joint of thumb 01/19/2022   Type 2 diabetes mellitus with hyperlipidemia (HCC) 01/01/2022   Carotid stenosis, right 12/02/2021   Acute on chronic diastolic CHF (congestive heart failure) (HCC) 09/25/2021   IDA (iron  deficiency anemia) 02/02/2021   Atherosclerosis of abdominal aorta (HCC) 03/19/2020   Coronary artery disease involving native coronary artery of native heart 03/19/2020   SOBOE (shortness of breath on exertion) 03/19/2020   Asthma    Acute respiratory failure with hypoxia (HCC)    Hypertensive retinopathy, bilateral 06/08/2019   Mild nonproliferative diabetic retinopathy associated with type 2 diabetes mellitus (HCC) 06/08/2019   Type 2 diabetes mellitus  with diabetic retinopathy (HCC) 05/31/2019   Branch retinal vein occlusion of right eye 05/23/2019   Hx of diabetic retinopathy 05/23/2019   Carotid stenosis 01/18/2019   History of fracture of left hip 10/26/2018   Hip fracture (HCC) 09/28/2018   Dizziness 09/28/2018   History of normocytic normochromic anemia 06/13/2018   Psychophysiological insomnia 06/13/2018   Status post total knee replacement using cement, left 12/01/2017   Synovial cyst of left popliteal space 05/27/2017   Hypertensive urgency 05/19/2017   Diabetic polyneuropathy associated with type 2 diabetes mellitus (HCC) 03/30/2017   Anxiety 05/19/2016   Insomnia 05/19/2016   Pure hypercholesterolemia 05/19/2016   Personal history of disease 03/11/2016   Status post hip hemiarthroplasty 10/10/2015   Hypoxia 09/26/2015   Hypoxemia 09/26/2015   Fracture of femoral neck, right (HCC) 09/23/2015   Diabetes mellitus without complication (HCC) 09/23/2015   Uncontrolled hypertension 09/23/2015   HLD (hyperlipidemia) 09/23/2015   GERD (gastroesophageal reflux disease) 09/23/2015   Depression 09/23/2015   Hypothyroidism 09/23/2015   DDD (degenerative disc disease), lumbar 08/26/2015   Lumbar stenosis with neurogenic claudication 08/26/2015   Nocturia 04/29/2015   Urinary frequency 04/29/2015   Atrophic vaginitis 04/29/2015   Mixed incontinence 06/28/2014   Lumbar radiculopathy 02/08/2013    Orientation RESPIRATION BLADDER Height & Weight     Self, Time, Situation, Place  O2 (2 liters per nasal cannula) Incontinent Weight: 61.2 kg Height:  5' 5 (165.1 cm)  BEHAVIORAL SYMPTOMS/MOOD NEUROLOGICAL BOWEL NUTRITION STATUS  Other (Comment) (n/a)  (n/a) Continent Diet (Carb modified)  AMBULATORY STATUS COMMUNICATION OF NEEDS Skin   Limited Assist Verbally  (eecyhmosis bilateral arm, erythema perineum)  Personal Care Assistance Level of Assistance  Bathing, Dressing Bathing Assistance: Limited  assistance   Dressing Assistance: Limited assistance     Functional Limitations Info  Hearing, Sight Sight Info: Adequate Hearing Info: Impaired      SPECIAL CARE FACTORS FREQUENCY  PT (By licensed PT), OT (By licensed OT)     PT Frequency: Min 2x weekly OT Frequency: Min 2x weekly            Contractures Contractures Info: Not present    Additional Factors Info  Code Status, Allergies Code Status Info: DNR Allergies Info: Cephalexin, Nitrofurantoin, Sulfa Antibiotics, Atorvastatin           Current Medications (02/09/2024):  This is the current hospital active medication list Current Facility-Administered Medications  Medication Dose Route Frequency Provider Last Rate Last Admin   acetaminophen  (TYLENOL ) tablet 650 mg  650 mg Oral Q4H PRN Acheampong, Peter K, MD   650 mg at 02/07/24 1842   amiodarone  (PACERONE ) tablet 200 mg  200 mg Oral BID Decoste, Gabriella, PA-C   200 mg at 02/09/24 0957   Followed by   [START ON 02/11/2024] amiodarone  (PACERONE ) tablet 200 mg  200 mg Oral Daily Decoste, Gabriella, PA-C       apixaban  (ELIQUIS ) tablet 5 mg  5 mg Oral BID Acheampong, Peter K, MD   5 mg at 02/09/24 1610   busPIRone  (BUSPAR ) tablet 10 mg  10 mg Oral BID Acheampong, Peter K, MD   10 mg at 02/09/24 0957   clopidogrel  (PLAVIX ) tablet 75 mg  75 mg Oral Daily Janeane Mealy, MD   75 mg at 02/09/24 9604   cyclobenzaprine  (FLEXERIL ) tablet 5 mg  5 mg Oral TID PRN Janeane Mealy, MD   5 mg at 02/09/24 5409   dapagliflozin propanediol (FARXIGA) tablet 10 mg  10 mg Oral Daily Decoste, Gabriella, PA-C   10 mg at 02/09/24 1008   feeding supplement (ENSURE PLUS HIGH PROTEIN) liquid 237 mL  237 mL Oral BID BM Duncan, Hazel V, MD   237 mL at 02/09/24 0959   furosemide  (LASIX ) tablet 40 mg  40 mg Oral Daily Decoste, Gabriella, PA-C   40 mg at 02/09/24 0957   guaiFENesin -dextromethorphan  (ROBITUSSIN DM) 100-10 MG/5ML syrup 5 mL  5 mL Oral Q4H PRN Alphonsus Jeans, MD   5 mL  at 02/05/24 1241   hydrALAZINE  (APRESOLINE ) injection 10 mg  10 mg Intravenous Q6H PRN Garrison Kanner, MD       insulin  aspart (novoLOG ) injection 0-5 Units  0-5 Units Subcutaneous QHS Wouk, Haynes Lips, MD       insulin  aspart (novoLOG ) injection 0-9 Units  0-9 Units Subcutaneous TID WC Wouk, Haynes Lips, MD   5 Units at 02/09/24 1302   levothyroxine  (SYNTHROID ) tablet 75 mcg  75 mcg Oral Q0600 Acheampong, Peter K, MD   75 mcg at 02/09/24 0508   losartan  (COZAAR ) tablet 100 mg  100 mg Oral Daily Decoste, Gabriella, PA-C   100 mg at 02/09/24 8119   metoprolol  succinate (TOPROL -XL) 24 hr tablet 50 mg  50 mg Oral Daily Decoste, Gabriella, PA-C   50 mg at 02/09/24 1478   mirabegron  ER (MYRBETRIQ ) tablet 25 mg  25 mg Oral Daily Acheampong, Peter K, MD   25 mg at 02/09/24 1008   multivitamin with minerals tablet 1 tablet  1 tablet Oral Daily Janeane Mealy, MD   1 tablet at 02/09/24 2956   nystatin-triamcinolone (MYCOLOG II) cream 1 Application  1  Application Topical BID Alphonsus Jeans, MD   1 Application at 02/08/24 2328   ondansetron  (ZOFRAN ) injection 4 mg  4 mg Intravenous Q6H PRN Acheampong, Peter K, MD       Oral care mouth rinse  15 mL Mouth Rinse PRN Lanetta Pion, MD       Oral care mouth rinse  15 mL Mouth Rinse PRN Lanetta Pion, MD       pantoprazole  (PROTONIX ) EC tablet 20 mg  20 mg Oral Daily Acheampong, Peter K, MD   20 mg at 02/09/24 1008   PARoxetine  (PAXIL ) tablet 40 mg  40 mg Oral QPM Acheampong, Peter K, MD   40 mg at 02/08/24 1727   phenol (CHLORASEPTIC) mouth spray 1 spray  1 spray Mouth/Throat PRN Alphonsus Jeans, MD   1 spray at 02/09/24 0358   rosuvastatin  (CRESTOR ) tablet 5 mg  5 mg Oral Daily Acheampong, Peter K, MD   5 mg at 02/09/24 0957   sodium chloride  flush (NS) 0.9 % injection 3 mL  3 mL Intravenous Q12H Acheampong, Peter K, MD   3 mL at 02/09/24 1008   sodium chloride  flush (NS) 0.9 % injection 3 mL  3 mL Intravenous PRN Acheampong, Peter K, MD        spironolactone (ALDACTONE) tablet 25 mg  25 mg Oral Daily Decoste, Gabriella, PA-C   25 mg at 02/09/24 0957   traZODone  (DESYREL ) tablet 25 mg  25 mg Oral QHS PRN Acheampong, Peter K, MD   25 mg at 02/08/24 2327     Discharge Medications: Please see discharge summary for a list of discharge medications.  Relevant Imaging Results:  Relevant Lab Results:   Additional Information SSN [id number]  Baird Bombard, RN

## 2024-02-09 NOTE — Progress Notes (Signed)
 Physical Therapy Treatment Patient Details Name: Mary [name] MRN: [id number] DOB: 08/20/1935 Today's Date: 02/09/2024   History of Present Illness Mary [name] is an 88yoF admitted for AF c RVR, hypokalemia, & hypomagnesemia. PMH significant for PAF on Eliquis , HTN, CAD, asthma/COPD, chronic HFpEF, HLD, RA, sleep apnea, spinal stenosis. Recently returned home from STR 5 days ago.    PT Comments  Pt in bed on entry, although short sitting with both feet over the side with the bed rial presenting an obstacle. Pt is awake, calm, pleasant, but clearly confused compared to previous days. Author facilitated more steady sitting EOB for a time as we converse, pt asking about drinking her water . Pt subsequently declined getting up further. We discussed progress regarding her neck pain from yesterday, which is iproved, however we work on repositioning in bed for a rest and making accommodations for her head/neck position using a towel roll. All needs met at end of session. Will continue to follow.    If plan is discharge home, recommend the following: A little help with walking and/or transfers;A little help with bathing/dressing/bathroom;Assist for transportation;Help with stairs or ramp for entrance   Can travel by private vehicle        Equipment Recommendations  None recommended by PT    Recommendations for Other Services       Precautions / Restrictions Precautions Precautions: Fall Recall of Precautions/Restrictions: Impaired Restrictions Weight Bearing Restrictions Per Provider Order: No     Mobility  Bed Mobility Overal bed mobility: Needs Assistance Bed Mobility: Supine to Sit, Sit to Supine     Supine to sit: Supervision Sit to supine: Supervision   General bed mobility comments: scooting up in bed requires maxA (put attempts, but does not make much progree overallaerw)    Transfers Overall transfer level:  (pt refuses at this time)                       Ambulation/Gait                   Stairs             Wheelchair Mobility     Tilt Bed    Modified Rankin (Stroke Patients Only)       Balance                                            Communication Communication Communication: No apparent difficulties Factors Affecting Communication: Reduced clarity of speech  Cognition                                        Cueing    Exercises Other Exercises Other Exercises: seated EOB x8 minutes, feet unsupported, good trunk control.    General Comments General comments (skin integrity, edema, etc.): found on RA at 82%, replaced 2L via Gravity and maintained 93-97% throughout session for SPT      Pertinent Vitals/Pain Pain Assessment Pain Location: neck pain sharp and cramping but improved since previous day; doe snot rate numerically Pain Intervention(s): Limited activity within patient's tolerance, Monitored during session, Repositioned    Home Living Family/patient expects to be discharged to:: Private residence Living Arrangements: Children Available Help at Discharge: Family;Available PRN/intermittently Type of Home: House Home Access: Stairs  to enter Entrance Stairs-Rails: Can reach both;Right;Left Entrance Stairs-Number of Steps: 3+1   Home Layout: One level Home Equipment: Agricultural consultant (2 wheels);Shower seat;Grab bars - toilet;Grab bars - tub/shower      Prior Function            PT Goals (current goals can now be found in the care plan section) Acute Rehab PT Goals Patient Stated Goal: return home PT Goal Formulation: With patient Time For Goal Achievement: 02/13/24 Potential to Achieve Goals: Fair Progress towards PT goals: Not progressing toward goals - comment    Frequency    Min 2X/week      PT Plan      Co-evaluation              AM-PAC PT 6 Clicks Mobility   Outcome Measure  Help needed turning from your back to your side while  in a flat bed without using bedrails?: A Lot Help needed moving from lying on your back to sitting on the side of a flat bed without using bedrails?: A Lot Help needed moving to and from a bed to a chair (including a wheelchair)?: A Lot Help needed standing up from a chair using your arms (e.g., wheelchair or bedside chair)?: A Lot Help needed to walk in hospital room?: A Little Help needed climbing 3-5 steps with a railing? : A Little 6 Click Score: 14    End of Session Equipment Utilized During Treatment: Oxygen Activity Tolerance: Patient tolerated treatment well;No increased pain;Patient limited by fatigue Patient left: in chair;with call bell/phone within reach;with chair alarm set Nurse Communication: Mobility status PT Visit Diagnosis: Muscle weakness (generalized) (M62.81)     Time: 1410-1420 PT Time Calculation (min) (ACUTE ONLY): 10 min  Charges:    $Therapeutic Activity: 8-22 mins PT General Charges $$ ACUTE PT VISIT: 1 Visit                    2:36 PM, 02/09/24 Dawn Eth, PT, DPT Physical Therapist - [facility]  [phone] (ASCOM)    Rihan Schueler C 02/09/2024, 2:33 PM

## 2024-02-09 NOTE — Progress Notes (Addendum)
 Attempted to call daughter x2 for discharge planning/ride coordination. Also attempted to call both sisters in contacts. Patient states there is no one to pick her up but she has money to pay the [place] police to take her home. Patient unsure if she has a key or way to get into home. Will continue to work with Consulting civil engineer and primary RN to determine dispo.

## 2024-02-09 NOTE — Progress Notes (Signed)
 PROGRESS NOTE    Mary [name]  ZOX:[id number] DOB: 1934-11-27 DOA: 01/29/2024 PCP: Monique Ano, MD  [id number]  LOS: 11 days   Brief hospital course:   Assessment & Plan: Mary [name] is a 89 y.o. female with medical history significant of coronary disease, hypertension, paroxysmal atrial fibrillation on anticoagulation with Eliquis , HFpEF, asthma/COPD as a result of secondhand smoking, hypothyroidism She presents to the emergency room with multiple complaints that include palpitations, associated shortness of breath and lower abdominal pain and spasms.  She felt her lower abdominal symptoms were likely attributed to urinary tract infection.  She denies any fever or chills.   In the ER, patient was noted to be in A-fib with RVR.  Heart rates in the 160s.  Patient required oxygen supplementation in the ED.  Chest x-ray did reveal some vascular congestion.   A-fib: with RVR.  S/p cardioversion on 02/07/24 in which pt converted to NSR.  D/c'ed digoxin  & diltiazem  as per cardio.  --cont oral amio with loading --cont Toprol  --cont Eliquis    Chronic diastolic CHF:  --cont oral lasix , Toprol  and spironolactone --increase losartan  to 100 mg daily   Pulmonary infiltrates: likely pneumonia. Completed abx course. Bronchodilators prn. Encourage incentive spirometry    Acute hypoxic respiratory failure:  Unable to wean from supplemental oxygen so far  --will d/c home with 2L home O2  Hypokalemia: --monitor and supplement PRN   History recurrent UTIs: urine cx growing multiple species. C/o dysuria. ? acute UTI. Completed abx course    Vaginal yeast infection: completed fluconazole course x 2 doses. Continue w/ mycolog cream    Hypothyroidism: continue on levothyroxine     DM2: fair control, HbA1c 7.0.  --ACHS and SSI   HTN:  --cont oral lasix , Toprol  and spironolactone --increase losartan  to 100 mg daily  HLD: continue on statin     GAD: severity unknown.  --cont Buspar   and Paxil    Debility: PT/OT recs HH    DVT prophylaxis: On:Eliquis  Code Status: DNR  Family Communication:  Level of care: Med-Surg Dispo:   The patient is from: home Anticipated d/c is to: SNF rehab Anticipated d/c date is: whenever bed available   Subjective and Interval History:  Pt continued to have left neck pain.  RN reported more confusion today.    PT/OT re-eval today changed rec to SNF rehab.   Objective: Vitals:   02/09/24 0500 02/09/24 0848 02/09/24 1308 02/09/24 1700  BP:  (!) 165/51 (!) 172/41 (!) 137/46  Pulse:  65 64 62  Resp:      Temp:  99.2 F (37.3 C) 99.5 F (37.5 C) 98.4 F (36.9 C)  TempSrc:   Oral   SpO2:  97% 95% 91%  Weight: 61.2 kg     Height:        Intake/Output Summary (Last 24 hours) at 02/09/2024 2022 Last data filed at 02/09/2024 1900 Gross per 24 hour  Intake 0 ml  Output 600 ml  Net -600 ml   Filed Weights   02/07/24 0500 02/08/24 0500 02/09/24 0500  Weight: 60.6 kg 60.9 kg 61.2 kg    Examination:   Constitutional: NAD, alert, oriented to person and place HEENT: conjunctivae and lids normal, EOMI CV: No cyanosis.   RESP: normal respiratory effort, on 2L Neuro: II - XII grossly intact.     Data Reviewed: I have personally reviewed labs and imaging studies  Time spent: 35 minutes  Garrison Kanner, MD [facility] If 7PM-7AM, please contact night-coverage 02/09/2024, 8:22  PM

## 2024-02-09 NOTE — Progress Notes (Signed)
 [facility] CLINIC CARDIOLOGY PROGRESS NOTE       [name] MRN: [id number] DOB/AGE: 05-13-1935 89 y.o.  Admit date: 01/29/2024 Referring Physician Dr. Hines Ludwig Primary Physician Monique Ano, MD Primary Cardiologist Dr. Larinda Plover Reason for Consultation AF RVR  HPI: Mary [name] is a 89 y.o. female  with a past medical history of paroxsymal atrial fibrillation (on Eliquis ), chronic HFpEF, coronary artery disease (on CT), hypertension, hyperlipidemia, T2DM, right carotid stenosis s/p CEA, pulmonary hypertension, COPD/asthma who presented to the ED on 01/29/2024 for shortness of breath, palpitations and lower abdominal pain. EKG in ED revealed AF RVR with rates 160s. Cardiology was consulted for further evaluation.   Interval History: -Patient seen and examined this AM and sitting in bedside chair eating breakfast. Patient states her SOB doesn't feel good today. Denies chest pain or palpitations.   -Patients BP elevated and HR stable.  -Per tele remains in SR, rate 60s (s/p TEE/DCCV on 06/17) -Patient remains on 2L [place] with stable SpO2.  -Plan for SNF placement   Review of systems complete and found to be negative unless listed above    Past Medical History:  Diagnosis Date   Acid reflux    Anemia    Arthritis    Asthma    Atherosclerosis of abdominal aorta (HCC)    B12 deficiency    Clostridium difficile infection    H/O   Coronary artery disease    DDD (degenerative disc disease), lumbar    Depression    Diabetes (HCC)    type 2   Diabetic retinopathy (HCC)    Dysrhythmia    Heart murmur    HLD (hyperlipidemia)    HTN (hypertension)    Hypotension    when get up too quickly   Hypothyroidism    Pneumonia    PONV (postoperative nausea and vomiting)    Pulmonary nodule    right middle lobe on CT scan   Pure hypercholesterolemia    RA (rheumatoid arthritis) (HCC)    Sleep apnea    Spinal stenosis    Urinary urgency     Past Surgical History:   Procedure Laterality Date   ABDOMINAL HYSTERECTOMY     CARDIOVERSION N/A 02/07/2024   Procedure: CARDIOVERSION;  Surgeon: Alluri, Odessa Bene, MD;  Location: [facility] ORS;  Service: Cardiovascular;  Laterality: N/A;   CATARACT EXTRACTION, BILATERAL     COLONOSCOPY N/A 07/29/2021   Procedure: COLONOSCOPY;  Surgeon: Toledo, Alphonsus Jeans, MD;  Location: [facility] ENDOSCOPY;  Service: Gastroenterology;  Laterality: N/A;   CYSTOSCOPY     ENDARTERECTOMY Right 12/02/2021   Procedure: ENDARTERECTOMY CAROTID;  Surgeon: Celso College, MD;  Location: [facility] ORS;  Service: Vascular;  Laterality: Right;   ESOPHAGOGASTRODUODENOSCOPY N/A 07/29/2021   Procedure: ESOPHAGOGASTRODUODENOSCOPY (EGD);  Surgeon: Toledo, Alphonsus Jeans, MD;  Location: [facility] ENDOSCOPY;  Service: Gastroenterology;  Laterality: N/A;  DM   HIP ARTHROPLASTY Right 09/24/2015   Procedure: ARTHROPLASTY BIPOLAR HIP (HEMIARTHROPLASTY);  Surgeon: Elner Hahn, MD;  Location: [facility] ORS;  Service: Orthopedics;  Laterality: Right;   HIP ARTHROPLASTY Left 09/29/2018   Procedure: ARTHROPLASTY BIPOLAR HIP (HEMIARTHROPLASTY) LEFT;  Surgeon: Elner Hahn, MD;  Location: [facility] ORS;  Service: Orthopedics;  Laterality: Left;   KNEE ARTHROSCOPY W/ AUTOGENOUS CARTILAGE IMPLANTATION (ACI) PROCEDURE     TEE WITHOUT CARDIOVERSION N/A 02/07/2024   Procedure: ECHOCARDIOGRAM, TRANSESOPHAGEAL;  Surgeon: Alluri, Odessa Bene, MD;  Location: [facility] ORS;  Service: Cardiovascular;  Laterality: N/A;   TOTAL KNEE ARTHROPLASTY Left 12/01/2017  Procedure: TOTAL KNEE ARTHROPLASTY;  Surgeon: Elner Hahn, MD;  Location: [facility] ORS;  Service: Orthopedics;  Laterality: Left;   TOTAL KNEE ARTHROPLASTY Right 2014   VARICOSE VEIN SURGERY Left    leg    Medications Prior to Admission  Medication Sig Dispense Refill Last Dose/Taking   acetaminophen  (TYLENOL ) 500 MG tablet Take 500 mg by mouth every 6 (six) hours as needed for mild pain (pain score 1-3).   Taking As Needed   azelastine (OPTIVAR) 0.05 %  ophthalmic solution Place 1 drop into both eyes daily as needed (allergies).   Taking As Needed   busPIRone  (BUSPAR ) 10 MG tablet Take 10 mg by mouth 2 (two) times daily.   01/29/2024   carvedilol  (COREG ) 6.25 MG tablet Take 6.25 mg by mouth 2 (two) times daily with a meal.   01/29/2024   cyclobenzaprine  (FLEXERIL ) 5 MG tablet Take 1 tablet (5 mg total) by mouth 3 (three) times daily as needed for muscle spasms.   Taking As Needed   Iron -Vitamin C  (VITRON-C) 65-125 MG TABS Take 1 tablet by mouth daily. 90 tablet 1 Past Week   levothyroxine  (SYNTHROID , LEVOTHROID) 75 MCG tablet Take 75 mcg by mouth daily.    01/29/2024   metFORMIN  (GLUCOPHAGE -XR) 500 MG 24 hr tablet Take 500 mg by mouth daily. With dinner   01/29/2024   pantoprazole  (PROTONIX ) 20 MG tablet Take 20 mg by mouth daily.   01/29/2024   PARoxetine  (PAXIL ) 40 MG tablet Take 40 mg by mouth every evening.   01/29/2024   potassium chloride  SA (KLOR-CON  M) 20 MEQ tablet Take 20 mEq by mouth 2 (two) times daily.  Take 1 tablet (20 mEq total) by mouth 2 (two) times daily With lasix    01/29/2024   rosuvastatin  (CRESTOR ) 5 MG tablet Take 1 tablet (5 mg total) by mouth daily. 30 tablet 0 Past Week   vitamin B-12 (CYANOCOBALAMIN ) 500 MCG tablet Take 500 mcg by mouth every other day.   01/29/2024   carvedilol  (COREG ) 25 MG tablet Take 2 tablets (50 mg total) by mouth 2 (two) times daily with a meal. (Patient not taking: Reported on 01/30/2024)   Not Taking   carvedilol  (COREG ) 25 MG tablet Take 25 mg by mouth 2 (two) times daily with a meal. (Patient not taking: Reported on 01/30/2024)   Not Taking   clopidogrel  (PLAVIX ) 75 MG tablet Take 75 mg by mouth daily. (Patient not taking: Reported on 01/30/2024)   Not Taking   digoxin  (LANOXIN ) 0.125 MG tablet Take 1 tablet (0.125 mg total) by mouth daily. (Patient not taking: Reported on 01/30/2024)   Not Taking   irbesartan  (AVAPRO ) 150 MG tablet Take 1 tablet (150 mg total) by mouth daily. (Patient not taking: Reported on  01/30/2024)   Not Taking   traZODone  (DESYREL ) 50 MG tablet Take 0.5 tablets (25 mg total) by mouth at bedtime as needed for sleep. (Patient not taking: Reported on 01/30/2024)   Not Taking   Vibegron  (GEMTESA ) 75 MG TABS Take 1 tablet (75 mg total) by mouth daily at 6 (six) AM. (Patient not taking: Reported on 01/30/2024)   Not Taking   [DISCONTINUED] apixaban  (ELIQUIS ) 5 MG TABS tablet Take 1 tablet (5 mg total) by mouth 2 (two) times daily. (Patient not taking: Reported on 01/30/2024)   Not Taking   [DISCONTINUED] furosemide  (LASIX ) 40 MG tablet Take 1 tablet (40 mg total) by mouth daily.   Past Week   [DISCONTINUED] losartan  (COZAAR ) 25 MG tablet Take  1 tablet by mouth daily.      Social History   Socioeconomic History   Marital status: Widowed    Spouse name: Not on file   Number of children: Not on file   Years of education: Not on file   Highest education level: Not on file  Occupational History   Occupation: retired  Tobacco Use   Smoking status: Never   Smokeless tobacco: Never  Vaping Use   Vaping status: Never Used  Substance and Sexual Activity   Alcohol  use: No    Alcohol /week: 0.0 standard drinks of alcohol    Drug use: No   Sexual activity: Not Currently  Other Topics Concern   Not on file  Social History Narrative   Lives with daughter   Social Drivers of Health   Financial Resource Strain: Low Risk  (09/28/2023)   Received from [facility]   Overall Financial Resource Strain (CARDIA)    Difficulty of Paying Living Expenses: Not hard at all  Food Insecurity: No Food Insecurity (01/30/2024)   Hunger Vital Sign    Worried About Running Out of Food in the Last Year: Never true    Ran Out of Food in the Last Year: Never true  Transportation Needs: No Transportation Needs (01/30/2024)   PRAPARE - Administrator, Civil Service (Medical): No    Lack of Transportation (Non-Medical): No  Physical Activity: Not on file  Stress: Not on file  Social  Connections: Moderately Isolated (01/30/2024)   Social Connection and Isolation Panel    Frequency of Communication with Friends and Family: More than three times a week    Frequency of Social Gatherings with Friends and Family: More than three times a week    Attends Religious Services: 1 to 4 times per year    Active Member of [employer] or Organizations: No    Attends Banker Meetings: Never    Marital Status: Widowed  Intimate Partner Violence: Not At Risk (01/30/2024)   Humiliation, Afraid, Rape, and Kick questionnaire    Fear of Current or Ex-Partner: No    Emotionally Abused: No    Physically Abused: No    Sexually Abused: No    Family History  Problem Relation Age of Onset   Kidney disease Brother        also nephew   Heart disease Mother    Heart disease Father    Prostate cancer Neg Hx    Bladder Cancer Neg Hx    Breast cancer Neg Hx    Kidney cancer Neg Hx      Vitals:   02/08/24 2355 02/09/24 0406 02/09/24 0500 02/09/24 0848  BP: (!) 150/43 (!) 159/71  (!) 165/51  Pulse:  64  65  Resp:  20    Temp:  98.6 F (37 C)  99.2 F (37.3 C)  TempSrc:  Oral    SpO2:  94%  97%  Weight:   61.2 kg   Height:        PHYSICAL EXAM General: well appearing elderly female, well nourished, in no acute distress. HEENT: Normocephalic and atraumatic. Neck: No JVD.   Lungs: Normal respiratory effort on 2L [place]. Diminished breath sounds bilaterally.  Heart: Irregularly, irregular, rate controlled. Normal S1 and S2 without gallops or murmurs.  Abdomen: Non-distended appearing.  Msk: Normal strength and tone for age. Extremities: Warm and well perfused. No clubbing, cyanosis. No edema.  Neuro: Alert and oriented X 3. Psych: Answers questions appropriately.  Labs: Basic Metabolic Panel: Recent Labs    02/07/24 0732 02/08/24 0346 02/09/24 0436  NA 138 136  --   K 4.4 4.0  --   CL 90* 88*  --   CO2 37* 34*  --   GLUCOSE 125* 126*  --   BUN 15 24*  --   CREATININE  0.61 0.84  --   CALCIUM  9.4 9.7  --   MG  --  2.1 2.0   Liver Function Tests: No results for input(s): AST, ALT, ALKPHOS, BILITOT, PROT, ALBUMIN in the last 72 hours.  No results for input(s): LIPASE, AMYLASE in the last 72 hours. CBC: Recent Labs    02/08/24 0346  WBC 11.3*  HGB 12.4  HCT 40.0  MCV 100.0  PLT 245    Cardiac Enzymes: No results for input(s): CKTOTAL, CKMB, CKMBINDEX, TROPONINIHS in the last 72 hours.  BNP: No results for input(s): BNP in the last 72 hours.  D-Dimer: No results for input(s): DDIMER in the last 72 hours. Hemoglobin A1C: No results for input(s): HGBA1C in the last 72 hours.  Fasting Lipid Panel: No results for input(s): CHOL, HDL, LDLCALC, TRIG, CHOLHDL, LDLDIRECT in the last 72 hours. Thyroid Function Tests: No results for input(s): TSH, T4TOTAL, T3FREE, THYROIDAB in the last 72 hours.  Invalid input(s): FREET3  Anemia Panel: No results for input(s): VITAMINB12, FOLATE, FERRITIN, TIBC, IRON , RETICCTPCT in the last 72 hours.   Radiology: ECHO TEE Result Date: 02/07/2024    TRANSESOPHOGEAL ECHO REPORT   Patient Name:   Sophiarose B Mira Date of Exam: 02/07/2024 Medical Rec #:  [id number]     Height:       65.0 in Accession #:    [id number]    Weight:       133.6 lb Date of Birth:  [birth year]     BSA:          1.666 m Patient Age:    88 years      BP:           130/65 mmHg Patient Gender: F             HR:           82 bpm. Exam Location:  [facility] Procedure: Transesophageal Echo and Color Doppler (Both Spectral and Color Flow            Doppler were utilized during procedure). Indications:     Atrial fibrillation  History:         Patient has prior history of Echocardiogram examinations, most                  recent 12/08/2023. CHF, CAD, Arrythmias:Atrial Fibrillation,                  Signs/Symptoms:Shortness of Breath; Risk Factors:Hypertension,                  Diabetes and Dyslipidemia.   Sonographer:     Terrilee Few RCS Referring Phys:  [id number] Kurt Phi Jehan Bonano Diagnosing Phys: Joetta Mustache PROCEDURE: After discussion of the risks and benefits of a TEE, an informed consent was obtained from the patient. The transesophogeal probe was passed without difficulty through the esophogus of the patient. Imaged were obtained with the patient in a left lateral decubitus position. Local oropharyngeal anesthetic was provided with Cetacaine. Sedation performed by different physician. The patient was monitored while under deep sedation. Anesthestetic sedation was provided intravenously by Anesthesiology: 70mg  of Propofol . Image quality was  excellent. The patient's vital signs; including heart rate, blood pressure, and oxygen saturation; remained stable throughout the procedure. The patient developed no complications during the procedure. A successful direct current cardioversion was performed at 200 joules with 1 attempt.  IMPRESSIONS  1. Left ventricular ejection fraction, by estimation, is 55 to 60%. The left ventricle has normal function.  2. Right ventricular systolic function is normal. The right ventricular size is normal.  3. Left atrial size was dilated. No left atrial/left atrial appendage thrombus was detected.  4. The mitral valve is normal in structure. Trivial mitral valve regurgitation.  5. The aortic valve is tricuspid. Aortic valve regurgitation is not visualized. Conclusion(s)/Recommendation(s): No LA/LAA thrombus identified. Successful cardioversion performed with restoration of normal sinus rhythm. FINDINGS  Left Ventricle: Left ventricular ejection fraction, by estimation, is 55 to 60%. The left ventricle has normal function. The left ventricular internal cavity size was normal in size. Right Ventricle: The right ventricular size is normal. No increase in right ventricular wall thickness. Right ventricular systolic function is normal. Left Atrium: Left atrial size was dilated. No left  atrial/left atrial appendage thrombus was detected. Right Atrium: Right atrial size was normal in size. Pericardium: There is no evidence of pericardial effusion. Mitral Valve: The mitral valve is normal in structure. Mild mitral annular calcification. Trivial mitral valve regurgitation. Tricuspid Valve: The tricuspid valve is normal in structure. Tricuspid valve regurgitation is mild. Aortic Valve: The aortic valve is tricuspid. Aortic valve regurgitation is not visualized. Pulmonic Valve: The pulmonic valve was normal in structure. Pulmonic valve regurgitation is not visualized. Aorta: The aortic root is normal in size and structure. IAS/Shunts: No atrial level shunt detected by color flow Doppler. Additional Comments: Spectral Doppler performed. Joetta Mustache Electronically signed by Joetta Mustache Signature Date/Time: 02/07/2024/1:40:59 PM    Final    DG Chest Port 1 View Result Date: 01/29/2024 CLINICAL DATA:  Shortness of breath EXAM: PORTABLE CHEST 1 VIEW COMPARISON:  01/08/2024 FINDINGS: Heart and mediastinal contours within normal limits. Patchy bilateral airspace opacities, right greater than left. No visible significant effusions. No acute bony abnormality. IMPRESSION: Patchy bilateral airspace opacities concerning for infection. Electronically Signed   By: Janeece Mechanic M.D.   On: 01/29/2024 17:31    ECHO 12/08/2023  1. Left ventricular ejection fraction, by estimation, is 60 to 65%. The left ventricle has normal function. The left ventricle has no regional wall motion abnormalities. Left ventricular diastolic parameters are indeterminate.   2. Right ventricular systolic function is normal. The right ventricular size is normal.   3. Left atrial size was mildly dilated.   4. Large pleural effusion.   5. The mitral valve is normal in structure. Trivial mitral valve regurgitation. No evidence of mitral stenosis.   6. The aortic valve is normal in structure. Aortic valve regurgitation is not  visualized. No aortic stenosis is present.   7. The inferior vena cava is normal in size with greater than 50% respiratory variability, suggesting right atrial pressure of 3 mmHg.   TELEMETRY reviewed by me 02/09/2024: sinus rhythm with frequent PVCs, rate 60s  EKG reviewed by me: atrial fibrillation RVR, rate 135 bpm  Data reviewed by me 02/09/2024: last 24h vitals tele labs imaging I/O hospitalist progress note  Principal Problem:   CHF (congestive heart failure) (HCC) Active Problems:   Diabetes mellitus without complication (HCC)   Hypothyroidism   Asthma   Coronary artery disease involving native coronary artery of native heart   Renal artery stenosis (HCC)  Hypertension, renovascular   A-fib (HCC)   Malnutrition of moderate degree    ASSESSMENT AND PLAN:   Sadako B Nair is a 89 y.o. female  with a past medical history of paroxsymal atrial fibrillation (on Eliquis ), chronic HFpEF, coronary artery disease (on CT), hypertension, hyperlipidemia, T2DM, right carotid stenosis s/p CEA, pulmonary hypertension, COPD/asthma who presented to the ED on 01/29/2024 for shortness of breath, palpitations and lower abdominal pain. EKG in ED revealed AF RVR with rates 160s. Patient recently hospitalized on 04/16 with AF RVR and AoCHFpEF with similar presentation.  Cardiology was consulted for further evaluation.   # Paroxymal atrial fibrillation  Patient reports palpitations, SOB for past 3 weeks and presents on admission with AF RVR in 160s. IV Caredizem gtt started in ED. Per tele patient remains in AF with  improved 70-80s. BP stable. TEE/DCCV with Dr. Bob Burn today (06/17). Patient converted to SR with rates 50s this afternoon. Per tele patient has remains in SR, rate 50s. -Monitor and replenish electrolytes for a goal K >4, Mag >2  -Continue Eliquis  5 mg twice daily for stroke risk reduction.  -Continue PO amio 200 mg BID for 10 days, then 200 mg daily.  -Continue metoprolol  succinate to 50 mg  daily.  # Acute on chronic HFpEF Patient presents with worsening SOB for past 3 weeks. CXR with patchy bilateral airspace opacities. BNP elevated at 820.Echo from 11/2023 with pEF (60-65%), no RWMA. -Continue PO Lasix  40 mg daily.  Closely monitor UOP, electrolytes, renal function. -Beta blocker as stated above. -Continue Farxiga 10 mg daily. (Per pharmacy copay [phone] - spoke to patient about cost and patient agreeable to starting medication).  -Continue Spiro to 25 mg daily.  -Continue losartan  to 50 mg daily.  # Coronary artery disease # Hypertension # Hyperlipidemia Patient denies chest pain. Troponin minimally elevated and flat 19 > 20.  EKG in ED with atrial fibrillation RVR, rate 135 bpm. -Continue Plavix  75 mg daily, rosuvastatin  5 mg daily.  -Beta blocker as stated above. -ARB as stated above. -Minimally elevated and flat trops in setting of AF RVR and AOCHFpEF is most consistent with demand/supply mismatch and not ACS   Plan for SNF placement. Recommend to ambulate patient prior to discharge. Will arrange for follow up in clinic with Dr. Larinda Plover in 1-2 weeks.   This patient's plan of care was discussed and created with Dr. Beau Bound and he is in agreement.  Signed: Bri Wakeman, PA-C  02/09/2024, 10:00 AM [facility] Cardiology

## 2024-02-09 NOTE — Plan of Care (Signed)
   Problem: Coping: Goal: Ability to adjust to condition or change in health will improve Outcome: Progressing   Problem: Metabolic: Goal: Ability to maintain appropriate glucose levels will improve Outcome: Progressing   Problem: Skin Integrity: Goal: Risk for impaired skin integrity will decrease Outcome: Progressing

## 2024-02-09 NOTE — Evaluation (Signed)
 Occupational Therapy Evaluation Patient Details Name: Mary [name] MRN: [id number] DOB: 04-16-1935 Today's Date: 02/09/2024   History of Present Illness   Mary [name] is an 88yoF admitted for AF c RVR, hypokalemia, & hypomagnesemia. PMH significant for PAF on Eliquis , HTN, CAD, asthma/COPD, chronic HFpEF, HLD, RA, sleep apnea, spinal stenosis. Recently returned home from STR 5 days ago.     Clinical Impressions Pt was seen for OT evaluation this date. PTA, pt resides at home with her daughter who has CP. Daughter has an aide who assists, but pt has to assist her when the aide is not there. Pt is typically very independent and able to ambulate with hurrycane or RW.  Pt presents to acute OT demonstrating impaired ADL performance and functional mobility 2/2 weakness, balance deficits and low activity tolerance. Pt with notable confusion this date compared to yesterday per nurse. OT found her halfway out of the recliner with 02 removed stating I'm at home, I need to get up. Redirected pt that she is still in the hospital and she is very anxious and wanting to return home. Assisted pt to stand and get to [facility] requiring Min A using RW with notable imbalance and shakiness. Pt required 2 attempts to stand from recliner. Max A for LB dressing and Max A x2 to safely return to recliner after toileting d/t her legs giving out during the transfer. Pt is a high fall risk and has poor safety awareness at this time. Nurse present for 2nd half of the session and aware of change in pt with MD updated. She would benefit from skilled OT services to address noted impairments and functional limitations to maximize safety and independence while minimizing falls risk and caregiver burden. Do anticipate the need for follow up OT services upon acute hospital DC.      If plan is discharge home, recommend the following:   Assistance with cooking/housework;Direct supervision/assist for medications management;Direct  supervision/assist for financial management;Assist for transportation;Help with stairs or ramp for entrance;A lot of help with walking and/or transfers;Two people to help with walking and/or transfers;A lot of help with bathing/dressing/bathroom     Functional Status Assessment   Patient has had a recent decline in their functional status and demonstrates the ability to make significant improvements in function in a reasonable and predictable amount of time.     Equipment Recommendations   Other (comment) (defer to next venue)     Recommendations for Other Services         Precautions/Restrictions   Precautions Precautions: Fall Recall of Precautions/Restrictions: Impaired Restrictions Weight Bearing Restrictions Per Provider Order: No     Mobility Bed Mobility               General bed mobility comments: NT found up in recliner on entry halfway out with leg rest still up, chair alarm going off and 02 removed    Transfers Overall transfer level: Needs assistance Equipment used: Rolling walker (2 wheels) Transfers: Sit to/from Stand, Bed to chair/wheelchair/BSC Sit to Stand: Min assist     Step pivot transfers: +2 physical assistance, +2 safety/equipment, Max assist, Mod assist     General transfer comment: increased unsteadiness, posterior lean, anxious and legs giving way this date; Min A x1 with RW for STS and SPT to [facility], then Max A x2 to return from [facility] to recliner      Balance Overall balance assessment: Needs assistance Sitting-balance support: Feet supported Sitting balance-Leahy Scale: Fair     Standing  balance support: Bilateral upper extremity supported, During functional activity, Reliant on assistive device for balance Standing balance-Leahy Scale: Poor Standing balance comment: Min to Max A X2 using RW, very unstable                           ADL either performed or assessed with clinical judgement   ADL Overall ADL's : Needs  assistance/impaired                     Lower Body Dressing: Maximal assistance;Sit to/from stand Lower Body Dressing Details (indicate cue type and reason): from [facility] Toilet Transfer: Minimal assistance;Rolling walker (2 wheels);+2 for physical assistance;+2 for safety/equipment;Maximal assistance;BSC/3in1;Stand-pivot Toilet Transfer Details (indicate cue type and reason): min A x1 with RW use to get to [facility] then Max A x2 to return to recliner d/t increased instability, shakiness and legs giving out                 Vision         Perception         Praxis         Pertinent Vitals/Pain Pain Assessment Pain Assessment: Faces Faces Pain Scale: Hurts even more Pain Location: neck pain sharp and cramping Pain Descriptors / Indicators: Cramping Pain Intervention(s): Monitored during session, Limited activity within patient's tolerance, Repositioned     Extremity/Trunk Assessment Upper Extremity Assessment Upper Extremity Assessment: Generalized weakness   Lower Extremity Assessment Lower Extremity Assessment: Generalized weakness   Cervical / Trunk Assessment Cervical / Trunk Assessment: Kyphotic   Communication Communication Communication: No apparent difficulties Factors Affecting Communication: Reduced clarity of speech   Cognition Arousal: Alert Behavior During Therapy: Anxious, Lability Cognition: Cognition impaired   Orientation impairments: Situation     Attention impairment (select first level of impairment): Focused attention, Sustained attention   OT - Cognition Comments: pt stating don't hurt me and they are going to hurt my daughter, etc during session and crying                 Following commands: Impaired Following commands impaired: Only follows one step commands consistently     Cueing  General Comments   Cueing Techniques: Verbal cues;Tactile cues  found on RA at 82%, replaced 2L via [place] and maintained 93-97% throughout  session for SPT   Exercises Other Exercises Other Exercises: Edu on role of OT in acute setting and need for follow up therapy.   Shoulder Instructions      Home Living Family/patient expects to be discharged to:: Private residence Living Arrangements: Children Available Help at Discharge: Family;Available PRN/intermittently Type of Home: House Home Access: Stairs to enter Entrance Stairs-Number of Steps: 3+1 Entrance Stairs-Rails: Can reach both;Right;Left Home Layout: One level     Bathroom Shower/Tub: Producer, television/film/video: Handicapped height Bathroom Accessibility: No   Home Equipment: Agricultural consultant (2 wheels);Shower seat;Grab bars - toilet;Grab bars - tub/shower          Prior Functioning/Environment Prior Level of Function : Independent/Modified Independent             Mobility Comments: per last admission: 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 If aid does not come pt helps daughter with bed mobility Pt reports she's ambulatory with hurrycane or RW, denies falls, reports she drove ~2  months ago (prior to last hospitalization) ADLs Comments: Independent    OT Problem List: Decreased strength;Decreased activity tolerance;Impaired balance (sitting and/or standing);Cardiopulmonary status limiting activity;Decreased cognition   OT Treatment/Interventions: Self-care/ADL training;Energy conservation;DME and/or AE instruction;Therapeutic activities;Patient/family education;Balance training      OT Goals(Current goals can be found in the care plan section)   Acute Rehab OT Goals Patient Stated Goal: go home OT Goal Formulation: With patient Time For Goal Achievement: 02/23/24 Potential to Achieve Goals: Fair ADL Goals Pt Will Perform Lower Body Bathing: with contact guard assist;with min assist;sitting/lateral leans;sit to/from  stand Pt Will Perform Lower Body Dressing: with min assist;with contact guard assist;sit to/from stand;sitting/lateral leans Pt Will Transfer to Toilet: with contact guard assist;with min assist;ambulating;regular height toilet Pt Will Perform Toileting - Clothing Manipulation and hygiene: with contact guard assist;sit to/from stand;sitting/lateral leans   OT Frequency:  Min 2X/week    Co-evaluation              AM-PAC OT 6 Clicks Daily Activity     Outcome Measure Help from another person eating meals?: A Little Help from another person taking care of personal grooming?: A Little Help from another person toileting, which includes using toliet, bedpan, or urinal?: A Lot Help from another person bathing (including washing, rinsing, drying)?: A Lot Help from another person to put on and taking off regular upper body clothing?: A Little Help from another person to put on and taking off regular lower body clothing?: A Lot 6 Click Score: 15   End of Session Equipment Utilized During Treatment: Gait belt;Rolling walker (2 wheels);Oxygen Nurse Communication: Mobility status  Activity Tolerance: Patient tolerated treatment well Patient left: in chair;with call bell/phone within reach;with chair alarm set  OT Visit Diagnosis: Muscle weakness (generalized) (M62.81);Other abnormalities of gait and mobility (R26.89);Unsteadiness on feet (R26.81)                Time: 1610-9604 OT Time Calculation (min): 29 min Charges:  OT General Charges $OT Visit: 1 Visit OT Evaluation $OT Eval Moderate Complexity: 1 Mod OT Treatments $Self Care/Home Management : 8-22 mins Sukanya Goldblatt, OTR/L 02/09/24, 12:41 PM  Santanna Whitford E Modelle Vollmer 02/09/2024, 12:37 PM

## 2024-02-10 DIAGNOSIS — I5033 Acute on chronic diastolic (congestive) heart failure: Secondary | ICD-10-CM | POA: Diagnosis not present

## 2024-02-10 LAB — GLUCOSE, CAPILLARY
Glucose-Capillary: 148 mg/dL — ABNORMAL HIGH (ref 70–99)
Glucose-Capillary: 233 mg/dL — ABNORMAL HIGH (ref 70–99)
Glucose-Capillary: 118 mg/dL — ABNORMAL HIGH (ref 70–99)
Glucose-Capillary: 138 mg/dL — ABNORMAL HIGH (ref 70–99)

## 2024-02-10 LAB — BASIC METABOLIC PANEL WITH GFR
Anion gap: 10 (ref 5–15)
BUN: 20 mg/dL (ref 8–23)
CO2: 37 mmol/L — ABNORMAL HIGH (ref 22–32)
Calcium: 9.6 mg/dL (ref 8.9–10.3)
Chloride: 89 mmol/L — ABNORMAL LOW (ref 98–111)
Creatinine, Ser: 0.61 mg/dL (ref 0.44–1.00)
GFR, Estimated: >60 mL/min (ref >60)
Glucose, Bld: 127 mg/dL — ABNORMAL HIGH (ref 70–99)
Potassium: 3.8 mmol/L (ref 3.5–5.1)
Sodium: 136 mmol/L (ref 135–145)

## 2024-02-10 LAB — MAGNESIUM: Magnesium: 2 mg/dL (ref 1.7–2.4)

## 2024-02-10 MED ORDER — ENSURE PLUS HIGH PROTEIN PO LIQD
237.0000 mL | Freq: Three times a day (TID) | ORAL | Status: DC
  Administered 2024-02-10 – 2024-02-13 (×9): 237 mL via ORAL

## 2024-02-10 NOTE — Plan of Care (Signed)
  Problem: Education: Goal: Ability to describe self-care measures that may prevent or decrease complications (Diabetes Survival Skills Education) will improve Outcome: Progressing   Problem: Coping: Goal: Ability to adjust to condition or change in health will improve Outcome: Progressing   Problem: Fluid Volume: Goal: Ability to maintain a balanced intake and output will improve Outcome: Progressing   Problem: Health Behavior/Discharge Planning: Goal: Ability to identify and utilize available resources and services will improve Outcome: Progressing   Problem: Metabolic: Goal: Ability to maintain appropriate glucose levels will improve Outcome: Progressing   Problem: Nutritional: Goal: Maintenance of adequate nutrition will improve Outcome: Progressing   Problem: Skin Integrity: Goal: Risk for impaired skin integrity will decrease Outcome: Progressing   Problem: Tissue Perfusion: Goal: Adequacy of tissue perfusion will improve Outcome: Progressing   Problem: Health Behavior/Discharge Planning: Goal: Ability to manage health-related needs will improve Outcome: Progressing   Problem: Activity: Goal: Risk for activity intolerance will decrease Outcome: Progressing   Problem: Nutrition: Goal: Adequate nutrition will be maintained Outcome: Progressing   Problem: Coping: Goal: Level of anxiety will decrease Outcome: Progressing   Problem: Elimination: Goal: Will not experience complications related to bowel motility Outcome: Progressing   Problem: Pain Managment: Goal: General experience of comfort will improve and/or be controlled Outcome: Progressing   Problem: Education: Goal: Ability to demonstrate management of disease process will improve Outcome: Progressing

## 2024-02-10 NOTE — Progress Notes (Addendum)
 PROGRESS NOTE    Mary [name]  ZOX:[id number] DOB: [birth year] DOA: 01/29/2024 PCP: Monique Ano, MD  109A/109A-AA  LOS: 12 days   Brief hospital course:   Assessment & Plan: Mary [name] is a 89 y.o. female with medical history significant of coronary disease, hypertension, paroxysmal atrial fibrillation on anticoagulation with Eliquis , HFpEF, asthma/COPD as a result of secondhand smoking, hypothyroidism She presents to the emergency room with multiple complaints that include palpitations, associated shortness of breath and lower abdominal pain and spasms.  She felt her lower abdominal symptoms were likely attributed to urinary tract infection.  She denies any fever or chills.   In the ER, patient was noted to be in A-fib with RVR.  Heart rates in the 160s.  Patient required oxygen supplementation in the ED.  Chest x-ray did reveal some vascular congestion.   A-fib: with RVR.  S/p cardioversion on 02/07/24 in which pt converted to NSR.  D/c'ed digoxin  & diltiazem  as per cardio.  --cont oral amio with loading --cont Toprol  --cont Eliquis    Chronic diastolic CHF:  --cont oral lasix , Toprol  and spironolactone --cont losartan    Pulmonary infiltrates: likely pneumonia. Completed abx course. Bronchodilators prn. Encourage incentive spirometry    Acute hypoxic respiratory failure:  Unable to wean from supplemental oxygen so far, currently need 2L O2. --Continue supplemental O2 to keep sats >=90%, wean as tolerated  Hypokalemia: --monitor and supplement PRN   History recurrent UTIs: urine cx growing multiple species. C/o dysuria. ? acute UTI. Completed abx course    Vaginal yeast infection: completed fluconazole course x 2 doses. Continue w/ mycolog cream    Hypothyroidism: continue on levothyroxine     DM2: fair control, HbA1c 7.0.  --ACHS and SSI   HTN:  --cont oral lasix , Toprol  and spironolactone --cont losartan   HLD: continue on statin     GAD: severity unknown.   --cont Buspar  and Paxil    Debility:  --PT/OT now rec SNF rehab  Moderate malnutrition  --supplements per dietician   DVT prophylaxis: On:Eliquis  Code Status: DNR  Family Communication:  Level of care: Med-Surg Dispo:   The patient is from: home Anticipated d/c is to: SNF rehab Anticipated d/c date is: whenever bed available   Subjective and Interval History:  Pt's mental status improved today, oriented and not confused.  Reported dyspnea improved.   Objective: Vitals:   02/10/24 0430 02/10/24 0800 02/10/24 1200 02/10/24 1621  BP: (!) 134/53 (!) 175/53 (!) 146/126 (!) 122/50  Pulse: 60  (!) 53 60  Resp: 15 20 18 16   Temp: (!) 97.5 F (36.4 C) 98 F (36.7 C) 98.2 F (36.8 C) 97.9 F (36.6 C)  TempSrc: Oral Oral Oral   SpO2: 97% 98% 97% 94%  Weight:      Height:        Intake/Output Summary (Last 24 hours) at 02/10/2024 1900 Last data filed at 02/10/2024 1300 Gross per 24 hour  Intake --  Output 1000 ml  Net -1000 ml   Filed Weights   02/08/24 0500 02/09/24 0500 02/10/24 0429  Weight: 60.9 kg 61.2 kg 59.3 kg    Examination:   Constitutional: NAD, AAOx3 HEENT: conjunctivae and lids normal, EOMI CV: No cyanosis.   RESP: normal respiratory effort, on 2L Neuro: II - XII grossly intact.   Psych: Normal mood and affect.  Appropriate judgement and reason   Data Reviewed: I have personally reviewed labs and imaging studies  Time spent: 35 minutes  Garrison Kanner, MD [facility] If 7PM-7AM,  please contact night-coverage 02/10/2024, 7:00 PM

## 2024-02-10 NOTE — Progress Notes (Signed)
 Nutrition Follow-up  DOCUMENTATION CODES:   Non-severe (moderate) malnutrition in context of chronic illness  INTERVENTION:   -Continue carb modified diet -Continue MVI with minerals daily -Increase Ensure Plus High Protein po to TID, each supplement provides 350 kcal and 20 grams of protein   NUTRITION DIAGNOSIS:   Moderate Malnutrition related to chronic illness (CHF) as evidenced by mild fat depletion, moderate fat depletion, mild muscle depletion, moderate muscle depletion, percent weight loss.  Ongoing  GOAL:   Patient will meet greater than or equal to 90% of their needs  Progressing   MONITOR:   PO intake, Supplement acceptance  REASON FOR ASSESSMENT:   Malnutrition Screening Tool    ASSESSMENT:   Pt with medical history significant of PAF on Eliquis , HTN, CAD, asthma/COPD, chronic HFpEF, hypothyroidism, presented with palpitations.  6/17- s/p cardioversion  Reviewed I/O's: -500 ml x 24 hours and -9.6 L since admission  UOP: 500 ml x 24 hours   Pt receiving personal care at time of visit.   Pt currently on a carb modified diet. Noted meal completions have decreased since last visit. Noted meal completions 0-45%. Pt is drinking Ensure supplements.   Reviewed wt hx; pt has experienced a 6.3% wt loss over the past week, which is significant for time frame. Suspect this is related to diuresis (-9.6 L since admission).   Per TOC notes, plan for SNF placement at discharge.   Medications reviewed and include farxiga, lasix , protonix , and aldactone.   Labs reviewed: CBGS: 148-300 (inpatient orders for glycemic control are 0-8 units insulin  aspart daily at bedtime ans 0-9 units insulin  aspart TID with meals).    Diet Order:   Diet Order             Diet - low sodium heart healthy           Diet Carb Modified Fluid consistency: Thin; Room service appropriate? Yes  Diet effective now                   EDUCATION NEEDS:   Education needs have been  addressed  Skin:  Skin Assessment: Reviewed RN Assessment  Last BM:  02/08/24 (type 3)  Height:   Ht Readings from Last 1 Encounters:  01/29/24 5' 5 (1.651 m)    Weight:   Wt Readings from Last 1 Encounters:  02/10/24 59.3 kg    Ideal Body Weight:  56.8 kg  BMI:  Body mass index is 21.76 kg/m.  Estimated Nutritional Needs:   Kcal:  1700-1900  Protein:  85-100 grams  Fluid:  1.7-1.9 L    Herschel Lords, RD, LDN, CDCES Registered Dietitian III Certified Diabetes Care and Education Specialist If unable to reach this RD, please use RD Inpatient group chat on secure chat between hours of 8am-4 pm daily

## 2024-02-10 NOTE — Progress Notes (Signed)
 [facility] CLINIC CARDIOLOGY PROGRESS NOTE       [name] MRN: [id number] DOB/AGE: December 03, 1934 89 y.o.  Admit date: 01/29/2024 Referring Physician Dr. Hines Ludwig Primary Physician Monique Ano, MD Primary Cardiologist Dr. Larinda Plover Reason for Consultation AF RVR  HPI: Mary [name] is a 89 y.o. female  with a past medical history of paroxsymal atrial fibrillation (on Eliquis ), chronic HFpEF, coronary artery disease (on CT), hypertension, hyperlipidemia, T2DM, right carotid stenosis s/p CEA, pulmonary hypertension, COPD/asthma who presented to the ED on 01/29/2024 for shortness of breath, palpitations and lower abdominal pain. EKG in ED revealed AF RVR with rates 160s. Cardiology was consulted for further evaluation.   Interval History: -Patient seen and examined this afternoon and sitting in bedside chair eating breakfast. Patient states she feels good. Patients reports having a HA. Denies SOB, chest pain or palpitations.   -Patients BP elevated and HR stable.  -Off tele today. (s/p TEE/DCCV on 06/17 converted to SR) -Patient remains on 2L [place] with stable SpO2.  -Plan for SNF rehab placement   Review of systems complete and found to be negative unless listed above    Past Medical History:  Diagnosis Date   Acid reflux    Anemia    Arthritis    Asthma    Atherosclerosis of abdominal aorta (HCC)    B12 deficiency    Clostridium difficile infection    H/O   Coronary artery disease    DDD (degenerative disc disease), lumbar    Depression    Diabetes (HCC)    type 2   Diabetic retinopathy (HCC)    Dysrhythmia    Heart murmur    HLD (hyperlipidemia)    HTN (hypertension)    Hypotension    when get up too quickly   Hypothyroidism    Pneumonia    PONV (postoperative nausea and vomiting)    Pulmonary nodule    right middle lobe on CT scan   Pure hypercholesterolemia    RA (rheumatoid arthritis) (HCC)    Sleep apnea    Spinal stenosis    Urinary urgency      Past Surgical History:  Procedure Laterality Date   ABDOMINAL HYSTERECTOMY     CARDIOVERSION N/A 02/07/2024   Procedure: CARDIOVERSION;  Surgeon: Alluri, Odessa Bene, MD;  Location: [facility] ORS;  Service: Cardiovascular;  Laterality: N/A;   CATARACT EXTRACTION, BILATERAL     COLONOSCOPY N/A 07/29/2021   Procedure: COLONOSCOPY;  Surgeon: Toledo, Alphonsus Jeans, MD;  Location: [facility] ENDOSCOPY;  Service: Gastroenterology;  Laterality: N/A;   CYSTOSCOPY     ENDARTERECTOMY Right 12/02/2021   Procedure: ENDARTERECTOMY CAROTID;  Surgeon: Celso College, MD;  Location: [facility] ORS;  Service: Vascular;  Laterality: Right;   ESOPHAGOGASTRODUODENOSCOPY N/A 07/29/2021   Procedure: ESOPHAGOGASTRODUODENOSCOPY (EGD);  Surgeon: Toledo, Alphonsus Jeans, MD;  Location: [facility] ENDOSCOPY;  Service: Gastroenterology;  Laterality: N/A;  DM   HIP ARTHROPLASTY Right 09/24/2015   Procedure: ARTHROPLASTY BIPOLAR HIP (HEMIARTHROPLASTY);  Surgeon: Elner Hahn, MD;  Location: [facility] ORS;  Service: Orthopedics;  Laterality: Right;   HIP ARTHROPLASTY Left 09/29/2018   Procedure: ARTHROPLASTY BIPOLAR HIP (HEMIARTHROPLASTY) LEFT;  Surgeon: Elner Hahn, MD;  Location: [facility] ORS;  Service: Orthopedics;  Laterality: Left;   KNEE ARTHROSCOPY W/ AUTOGENOUS CARTILAGE IMPLANTATION (ACI) PROCEDURE     TEE WITHOUT CARDIOVERSION N/A 02/07/2024   Procedure: ECHOCARDIOGRAM, TRANSESOPHAGEAL;  Surgeon: Alluri, Odessa Bene, MD;  Location: [facility] ORS;  Service: Cardiovascular;  Laterality: N/A;   TOTAL KNEE ARTHROPLASTY  Left 12/01/2017   Procedure: TOTAL KNEE ARTHROPLASTY;  Surgeon: Elner Hahn, MD;  Location: [facility] ORS;  Service: Orthopedics;  Laterality: Left;   TOTAL KNEE ARTHROPLASTY Right 2014   VARICOSE VEIN SURGERY Left    leg    Medications Prior to Admission  Medication Sig Dispense Refill Last Dose/Taking   acetaminophen  (TYLENOL ) 500 MG tablet Take 500 mg by mouth every 6 (six) hours as needed for mild pain (pain score 1-3).   Taking As Needed    azelastine (OPTIVAR) 0.05 % ophthalmic solution Place 1 drop into both eyes daily as needed (allergies).   Taking As Needed   busPIRone  (BUSPAR ) 10 MG tablet Take 10 mg by mouth 2 (two) times daily.   01/29/2024   carvedilol  (COREG ) 6.25 MG tablet Take 6.25 mg by mouth 2 (two) times daily with a meal.   01/29/2024   cyclobenzaprine  (FLEXERIL ) 5 MG tablet Take 1 tablet (5 mg total) by mouth 3 (three) times daily as needed for muscle spasms.   Taking As Needed   Iron -Vitamin C  (VITRON-C) 65-125 MG TABS Take 1 tablet by mouth daily. 90 tablet 1 Past Week   levothyroxine  (SYNTHROID , LEVOTHROID) 75 MCG tablet Take 75 mcg by mouth daily.    01/29/2024   metFORMIN  (GLUCOPHAGE -XR) 500 MG 24 hr tablet Take 500 mg by mouth daily. With dinner   01/29/2024   pantoprazole  (PROTONIX ) 20 MG tablet Take 20 mg by mouth daily.   01/29/2024   PARoxetine  (PAXIL ) 40 MG tablet Take 40 mg by mouth every evening.   01/29/2024   potassium chloride  SA (KLOR-CON  M) 20 MEQ tablet Take 20 mEq by mouth 2 (two) times daily.  Take 1 tablet (20 mEq total) by mouth 2 (two) times daily With lasix    01/29/2024   rosuvastatin  (CRESTOR ) 5 MG tablet Take 1 tablet (5 mg total) by mouth daily. 30 tablet 0 Past Week   vitamin B-12 (CYANOCOBALAMIN ) 500 MCG tablet Take 500 mcg by mouth every other day.   01/29/2024   carvedilol  (COREG ) 25 MG tablet Take 2 tablets (50 mg total) by mouth 2 (two) times daily with a meal. (Patient not taking: Reported on 01/30/2024)   Not Taking   carvedilol  (COREG ) 25 MG tablet Take 25 mg by mouth 2 (two) times daily with a meal. (Patient not taking: Reported on 01/30/2024)   Not Taking   clopidogrel  (PLAVIX ) 75 MG tablet Take 75 mg by mouth daily. (Patient not taking: Reported on 01/30/2024)   Not Taking   digoxin  (LANOXIN ) 0.125 MG tablet Take 1 tablet (0.125 mg total) by mouth daily. (Patient not taking: Reported on 01/30/2024)   Not Taking   irbesartan  (AVAPRO ) 150 MG tablet Take 1 tablet (150 mg total) by mouth daily. (Patient not  taking: Reported on 01/30/2024)   Not Taking   traZODone  (DESYREL ) 50 MG tablet Take 0.5 tablets (25 mg total) by mouth at bedtime as needed for sleep. (Patient not taking: Reported on 01/30/2024)   Not Taking   Vibegron  (GEMTESA ) 75 MG TABS Take 1 tablet (75 mg total) by mouth daily at 6 (six) AM. (Patient not taking: Reported on 01/30/2024)   Not Taking   [DISCONTINUED] apixaban  (ELIQUIS ) 5 MG TABS tablet Take 1 tablet (5 mg total) by mouth 2 (two) times daily. (Patient not taking: Reported on 01/30/2024)   Not Taking   [DISCONTINUED] furosemide  (LASIX ) 40 MG tablet Take 1 tablet (40 mg total) by mouth daily.   Past Week   [DISCONTINUED] losartan  (COZAAR )  25 MG tablet Take 1 tablet by mouth daily.      Social History   Socioeconomic History   Marital status: Widowed    Spouse name: Not on file   Number of children: Not on file   Years of education: Not on file   Highest education level: Not on file  Occupational History   Occupation: retired  Tobacco Use   Smoking status: Never   Smokeless tobacco: Never  Vaping Use   Vaping status: Never Used  Substance and Sexual Activity   Alcohol  use: No    Alcohol /week: 0.0 standard drinks of alcohol    Drug use: No   Sexual activity: Not Currently  Other Topics Concern   Not on file  Social History Narrative   Lives with daughter   Social Drivers of Health   Financial Resource Strain: Low Risk  (09/28/2023)   Received from [facility]   Overall Financial Resource Strain (CARDIA)    Difficulty of Paying Living Expenses: Not hard at all  Food Insecurity: No Food Insecurity (01/30/2024)   Hunger Vital Sign    Worried About Running Out of Food in the Last Year: Never true    Ran Out of Food in the Last Year: Never true  Transportation Needs: No Transportation Needs (01/30/2024)   PRAPARE - Administrator, Civil Service (Medical): No    Lack of Transportation (Non-Medical): No  Physical Activity: Not on file  Stress:  Not on file  Social Connections: Moderately Isolated (01/30/2024)   Social Connection and Isolation Panel    Frequency of Communication with Friends and Family: More than three times a week    Frequency of Social Gatherings with Friends and Family: More than three times a week    Attends Religious Services: 1 to 4 times per year    Active Member of [employer] or Organizations: No    Attends Banker Meetings: Never    Marital Status: Widowed  Intimate Partner Violence: Not At Risk (01/30/2024)   Humiliation, Afraid, Rape, and Kick questionnaire    Fear of Current or Ex-Partner: No    Emotionally Abused: No    Physically Abused: No    Sexually Abused: No    Family History  Problem Relation Age of Onset   Kidney disease Brother        also nephew   Heart disease Mother    Heart disease Father    Prostate cancer Neg Hx    Bladder Cancer Neg Hx    Breast cancer Neg Hx    Kidney cancer Neg Hx      Vitals:   02/10/24 0429 02/10/24 0430 02/10/24 0800 02/10/24 1200  BP:  (!) 134/53 (!) 175/53 (!) 146/126  Pulse:  60  (!) 53  Resp:  15 20 18   Temp:  (!) 97.5 F (36.4 C) 98 F (36.7 C) 98.2 F (36.8 C)  TempSrc:  Oral Oral Oral  SpO2:  97% 98% 97%  Weight: 59.3 kg     Height:        PHYSICAL EXAM General: well appearing elderly female, well nourished, in no acute distress. HEENT: Normocephalic and atraumatic. Neck: No JVD.   Lungs: Normal respiratory effort on 2L [place]. Diminished breath sounds bilaterally.  Heart: Irregularly, irregular, rate controlled. Normal S1 and S2 without gallops or murmurs.  Abdomen: Non-distended appearing.  Msk: Normal strength and tone for age. Extremities: Warm and well perfused. No clubbing, cyanosis. No edema.  Neuro: Alert  and oriented X 3. Psych: Answers questions appropriately.   Labs: Basic Metabolic Panel: Recent Labs    02/08/24 0346 02/09/24 0436 02/10/24 0540  NA 136  --  136  K 4.0  --  3.8  CL 88*  --  89*  CO2 34*  --   37*  GLUCOSE 126*  --  127*  BUN 24*  --  20  CREATININE 0.84  --  0.61  CALCIUM  9.7  --  9.6  MG 2.1 2.0 2.0   Liver Function Tests: No results for input(s): AST, ALT, ALKPHOS, BILITOT, PROT, ALBUMIN in the last 72 hours.  No results for input(s): LIPASE, AMYLASE in the last 72 hours. CBC: Recent Labs    02/08/24 0346  WBC 11.3*  HGB 12.4  HCT 40.0  MCV 100.0  PLT 245    Cardiac Enzymes: No results for input(s): CKTOTAL, CKMB, CKMBINDEX, TROPONINIHS in the last 72 hours.  BNP: No results for input(s): BNP in the last 72 hours.  D-Dimer: No results for input(s): DDIMER in the last 72 hours. Hemoglobin A1C: No results for input(s): HGBA1C in the last 72 hours.  Fasting Lipid Panel: No results for input(s): CHOL, HDL, LDLCALC, TRIG, CHOLHDL, LDLDIRECT in the last 72 hours. Thyroid Function Tests: No results for input(s): TSH, T4TOTAL, T3FREE, THYROIDAB in the last 72 hours.  Invalid input(s): FREET3  Anemia Panel: No results for input(s): VITAMINB12, FOLATE, FERRITIN, TIBC, IRON , RETICCTPCT in the last 72 hours.   Radiology: ECHO TEE Result Date: 02/07/2024    TRANSESOPHOGEAL ECHO REPORT   Patient Name:   Mary [name] Date of Exam: 02/07/2024 Medical Rec #:  [id number]     Height:       65.0 in Accession #:    [id number]    Weight:       133.6 lb Date of Birth:  1935-06-05     BSA:          1.666 m Patient Age:    88 years      BP:           130/65 mmHg Patient Gender: F             HR:           82 bpm. Exam Location:  [facility] Procedure: Transesophageal Echo and Color Doppler (Both Spectral and Color Flow            Doppler were utilized during procedure). Indications:     Atrial fibrillation  History:         Patient has prior history of Echocardiogram examinations, most                  recent 12/08/2023. CHF, CAD, Arrythmias:Atrial Fibrillation,                  Signs/Symptoms:Shortness of Breath; Risk  Factors:Hypertension,                  Diabetes and Dyslipidemia.  Sonographer:     Terrilee Few RCS Referring Phys:  [id number] Kurt Phi Emmersyn Kratzke Diagnosing Phys: Joetta Mustache PROCEDURE: After discussion of the risks and benefits of a TEE, an informed consent was obtained from the patient. The transesophogeal probe was passed without difficulty through the esophogus of the patient. Imaged were obtained with the patient in a left lateral decubitus position. Local oropharyngeal anesthetic was provided with Cetacaine. Sedation performed by different physician. The patient was monitored while under deep sedation. Anesthestetic sedation was provided intravenously  by Anesthesiology: 70mg  of Propofol . Image quality was excellent. The patient's vital signs; including heart rate, blood pressure, and oxygen saturation; remained stable throughout the procedure. The patient developed no complications during the procedure. A successful direct current cardioversion was performed at 200 joules with 1 attempt.  IMPRESSIONS  1. Left ventricular ejection fraction, by estimation, is 55 to 60%. The left ventricle has normal function.  2. Right ventricular systolic function is normal. The right ventricular size is normal.  3. Left atrial size was dilated. No left atrial/left atrial appendage thrombus was detected.  4. The mitral valve is normal in structure. Trivial mitral valve regurgitation.  5. The aortic valve is tricuspid. Aortic valve regurgitation is not visualized. Conclusion(s)/Recommendation(s): No LA/LAA thrombus identified. Successful cardioversion performed with restoration of normal sinus rhythm. FINDINGS  Left Ventricle: Left ventricular ejection fraction, by estimation, is 55 to 60%. The left ventricle has normal function. The left ventricular internal cavity size was normal in size. Right Ventricle: The right ventricular size is normal. No increase in right ventricular wall thickness. Right ventricular systolic  function is normal. Left Atrium: Left atrial size was dilated. No left atrial/left atrial appendage thrombus was detected. Right Atrium: Right atrial size was normal in size. Pericardium: There is no evidence of pericardial effusion. Mitral Valve: The mitral valve is normal in structure. Mild mitral annular calcification. Trivial mitral valve regurgitation. Tricuspid Valve: The tricuspid valve is normal in structure. Tricuspid valve regurgitation is mild. Aortic Valve: The aortic valve is tricuspid. Aortic valve regurgitation is not visualized. Pulmonic Valve: The pulmonic valve was normal in structure. Pulmonic valve regurgitation is not visualized. Aorta: The aortic root is normal in size and structure. IAS/Shunts: No atrial level shunt detected by color flow Doppler. Additional Comments: Spectral Doppler performed. Joetta Mustache Electronically signed by Joetta Mustache Signature Date/Time: 02/07/2024/1:40:59 PM    Final    DG Chest Port 1 View Result Date: 01/29/2024 CLINICAL DATA:  Shortness of breath EXAM: PORTABLE CHEST 1 VIEW COMPARISON:  01/08/2024 FINDINGS: Heart and mediastinal contours within normal limits. Patchy bilateral airspace opacities, right greater than left. No visible significant effusions. No acute bony abnormality. IMPRESSION: Patchy bilateral airspace opacities concerning for infection. Electronically Signed   By: Janeece Mechanic M.D.   On: 01/29/2024 17:31    ECHO 12/08/2023  1. Left ventricular ejection fraction, by estimation, is 60 to 65%. The left ventricle has normal function. The left ventricle has no regional wall motion abnormalities. Left ventricular diastolic parameters are indeterminate.   2. Right ventricular systolic function is normal. The right ventricular size is normal.   3. Left atrial size was mildly dilated.   4. Large pleural effusion.   5. The mitral valve is normal in structure. Trivial mitral valve regurgitation. No evidence of mitral stenosis.   6. The aortic  valve is normal in structure. Aortic valve regurgitation is not visualized. No aortic stenosis is present.   7. The inferior vena cava is normal in size with greater than 50% respiratory variability, suggesting right atrial pressure of 3 mmHg.   TELEMETRY reviewed by me 02/10/2024: off tele  EKG reviewed by me: atrial fibrillation RVR, rate 135 bpm  Data reviewed by me 02/10/2024: last 24h vitals tele labs imaging I/O hospitalist progress note  Principal Problem:   CHF (congestive heart failure) (HCC) Active Problems:   Diabetes mellitus without complication (HCC)   Hypothyroidism   Asthma   Coronary artery disease involving native coronary artery of native heart   Renal artery  stenosis (HCC)   Hypertension, renovascular   A-fib (HCC)   Malnutrition of moderate degree    ASSESSMENT AND PLAN:   Mary [name] is a 89 y.o. female  with a past medical history of paroxsymal atrial fibrillation (on Eliquis ), chronic HFpEF, coronary artery disease (on CT), hypertension, hyperlipidemia, T2DM, right carotid stenosis s/p CEA, pulmonary hypertension, COPD/asthma who presented to the ED on 01/29/2024 for shortness of breath, palpitations and lower abdominal pain. EKG in ED revealed AF RVR with rates 160s. Patient recently hospitalized on 04/16 with AF RVR and AoCHFpEF with similar presentation.  Cardiology was consulted for further evaluation.   # Paroxymal atrial fibrillation  Patient reports palpitations, SOB for past 3 weeks and presents on admission with AF RVR in 160s. IV Caredizem gtt started in ED. Per tele patient remains in AF with  improved 70-80s. BP stable. TEE/DCCV with Dr. Bob Burn today (06/17). Patient converted to SR with rates 50s on 06/17 and has remained in SR.  -Monitor and replenish electrolytes for a goal K >4, Mag >2  -Continue Eliquis  5 mg twice daily for stroke risk reduction.  -Continue PO amio 200 mg BID for 10 days, then 200 mg daily.  -Continue metoprolol  succinate to 50 mg  daily.  # Acute on chronic HFpEF Patient presents with worsening SOB for past 3 weeks. CXR with patchy bilateral airspace opacities. BNP elevated at 820.Echo from 11/2023 with pEF (60-65%), no RWMA. -Continue PO Lasix  40 mg daily.  Closely monitor UOP, electrolytes, renal function. -Beta blocker as stated above. -Continue Farxiga 10 mg daily. (Per pharmacy copay [phone] - spoke to patient about cost and patient agreeable to starting medication).  -Continue Spiro to 25 mg daily.  -Continue losartan  to 50 mg daily.  # Coronary artery disease # Hypertension # Hyperlipidemia Patient denies chest pain. Troponin minimally elevated and flat 19 > 20.  EKG in ED with atrial fibrillation RVR, rate 135 bpm. -Continue Plavix  75 mg daily, rosuvastatin  5 mg daily.  -Beta blocker as stated above. -ARB as stated above. -Minimally elevated and flat trops in setting of AF RVR and AOCHFpEF is most consistent with demand/supply mismatch and not ACS   Plan for SNF rehab placement. No further recommendations from cardiac perspective.  Will sign off at this time. Recommend patient ambulate patient prior to discharge. Will arrange for follow up in clinic with Dr. Larinda Plover in 1-2 weeks.   This patient's plan of care was discussed and created with Dr. Beau Bound and he is in agreement.  Signed: Creighton Doffing, PA-C  02/10/2024, 1:21 PM [facility] Cardiology

## 2024-02-10 NOTE — Plan of Care (Signed)
  Problem: Education: Goal: Ability to describe self-care measures that may prevent or decrease complications (Diabetes Survival Skills Education) will improve Outcome: Progressing Goal: Individualized Educational Video(s) Outcome: Progressing   Problem: Coping: Goal: Ability to adjust to condition or change in health will improve Outcome: Progressing   Problem: Fluid Volume: Goal: Ability to maintain a balanced intake and output will improve Outcome: Progressing   Problem: Health Behavior/Discharge Planning: Goal: Ability to identify and utilize available resources and services will improve Outcome: Progressing Goal: Ability to manage health-related needs will improve Outcome: Progressing   Problem: Metabolic: Goal: Ability to maintain appropriate glucose levels will improve Outcome: Progressing   Problem: Nutritional: Goal: Maintenance of adequate nutrition will improve Outcome: Progressing Goal: Progress toward achieving an optimal weight will improve Outcome: Progressing   Problem: Skin Integrity: Goal: Risk for impaired skin integrity will decrease Outcome: Progressing   Problem: Tissue Perfusion: Goal: Adequacy of tissue perfusion will improve Outcome: Progressing   Problem: Health Behavior/Discharge Planning: Goal: Ability to manage health-related needs will improve Outcome: Progressing   Problem: Clinical Measurements: Goal: Ability to maintain clinical measurements within normal limits will improve Outcome: Progressing Goal: Will remain free from infection Outcome: Progressing Goal: Diagnostic test results will improve Outcome: Progressing Goal: Respiratory complications will improve Outcome: Progressing Goal: Cardiovascular complication will be avoided Outcome: Progressing   Problem: Activity: Goal: Risk for activity intolerance will decrease Outcome: Progressing   Problem: Nutrition: Goal: Adequate nutrition will be maintained Outcome:  Progressing   Problem: Coping: Goal: Level of anxiety will decrease Outcome: Progressing   Problem: Elimination: Goal: Will not experience complications related to bowel motility Outcome: Progressing Goal: Will not experience complications related to urinary retention Outcome: Progressing   Problem: Pain Managment: Goal: General experience of comfort will improve and/or be controlled Outcome: Progressing   Problem: Safety: Goal: Ability to remain free from injury will improve Outcome: Progressing   Problem: Skin Integrity: Goal: Risk for impaired skin integrity will decrease Outcome: Progressing   Problem: Education: Goal: Ability to demonstrate management of disease process will improve Outcome: Progressing Goal: Ability to verbalize understanding of medication therapies will improve Outcome: Progressing Goal: Individualized Educational Video(s) Outcome: Progressing   Problem: Activity: Goal: Capacity to carry out activities will improve Outcome: Progressing   Problem: Cardiac: Goal: Ability to achieve and maintain adequate cardiopulmonary perfusion will improve Outcome: Progressing   Problem: Education: Goal: Knowledge of disease or condition will improve Outcome: Progressing Goal: Understanding of medication regimen will improve Outcome: Progressing Goal: Individualized Educational Video(s) Outcome: Progressing   Problem: Activity: Goal: Ability to tolerate increased activity will improve Outcome: Progressing   Problem: Cardiac: Goal: Ability to achieve and maintain adequate cardiopulmonary perfusion will improve Outcome: Progressing   Problem: Health Behavior/Discharge Planning: Goal: Ability to safely manage health-related needs after discharge will improve Outcome: Progressing

## 2024-02-10 NOTE — Progress Notes (Signed)
 Occupational Therapy Treatment Patient Details Name: Mary [name] MRN: [id number] DOB: 02/07/1935 Today's Date: 02/10/2024   History of present illness Mary [name] is an 88yoF admitted for AF c RVR, hypokalemia, & hypomagnesemia. PMH significant for PAF on Eliquis , HTN, CAD, asthma/COPD, chronic HFpEF, HLD, RA, sleep apnea, spinal stenosis. Recently returned home from STR 5 days ago.   OT comments  Pt is supine in bed on arrival. Pleasant and agreeable to OT session. She reports issues with neck pain, but did not complain of any during session. Pt does report hallucinations/seeing a cat in the room. Pt required SUP for supine to sit at EOB with increased time/effort. Able to sit at EOB with no LOB, cued for appropriate positioning at EOB to prepare for standing and she required Mod A for STS from EOB with immediate knee buckling. Pt edu to breathe/calm down and the knee buckling diminished and pt was able to perform SPT to recliner with Mod/Max A, notable posterior bias and cues for safety and RW management. Pt stood again from recliner with Mod A x1 to RW with ability to perform standing marches with Min/MOD A to prevent LOB. Nurses notified of 2 person assist to return to bed when ready. Pt left with all needs in place and will cont to require skilled acute OT services to maximize her safety and IND to return to PLOF.       If plan is discharge home, recommend the following:  Assistance with cooking/housework;Direct supervision/assist for medications management;Direct supervision/assist for financial management;Assist for transportation;Help with stairs or ramp for entrance;A lot of help with walking and/or transfers;Two people to help with walking and/or transfers;A lot of help with bathing/dressing/bathroom   Equipment Recommendations  Other (comment) (defer)    Recommendations for Other Services      Precautions / Restrictions Precautions Precautions: Fall Recall of  Precautions/Restrictions: Impaired Restrictions Weight Bearing Restrictions Per Provider Order: No       Mobility Bed Mobility Overal bed mobility: Needs Assistance Bed Mobility: Supine to Sit     Supine to sit: Supervision, HOB elevated     General bed mobility comments: supervision to reach EOB with HOB elevated    Transfers Overall transfer level: Needs assistance Equipment used: Rolling walker (2 wheels) Transfers: Sit to/from Stand, Bed to chair/wheelchair/BSC Sit to Stand: Mod assist     Step pivot transfers: Max assist, Mod assist     General transfer comment: Mod A for STS from EOB to RW with cueing for anterior weight shift to prevent posterior LOB, hand/feet placement; pt with noted knee buckling on initial stand but with cues to breathe and calm down they subside enough to transfer to recliner with Mod/Max A x1 with RW management and frequent cueing for safety with posterior bias noted     Balance Overall balance assessment: Needs assistance Sitting-balance support: Feet supported Sitting balance-Leahy Scale: Fair Sitting balance - Comments: able to sit EOB with SBA   Standing balance support: Bilateral upper extremity supported, During functional activity, Reliant on assistive device for balance Standing balance-Leahy Scale: Poor Standing balance comment: Mod/Max A x1 with RW                           ADL either performed or assessed with clinical judgement   ADL Overall ADL's : Needs assistance/impaired  Toilet Transfer: Maximal assistance;Moderate assistance;Rolling walker (2 wheels) Toilet Transfer Details (indicate cue type and reason): simulated to recliner with Mod/Max A d/t cueing for sequencing, RW management and instability/posterior lean                Extremity/Trunk Assessment              Vision       Perception     Praxis     Communication Communication Communication: No  apparent difficulties   Cognition Arousal: Alert Behavior During Therapy: Anxious, Lability Cognition: Cognition impaired   Orientation impairments: Situation     Attention impairment (select first level of impairment): Focused attention, Sustained attention   OT - Cognition Comments: hallucinating about a cat being in the room                 Following commands: Impaired Following commands impaired: Only follows one step commands consistently      Cueing   Cueing Techniques: Verbal cues, Tactile cues  Exercises Other Exercises Other Exercises: Edu on seated BLE exercises to maximize strength    Shoulder Instructions       General Comments sp02 stable on 1L at 95% after activity, HR up to 73 at most    Pertinent Vitals/ Pain       Pain Assessment Pain Assessment: Faces Faces Pain Scale: No hurt Pain Intervention(s): Monitored during session, Repositioned, Limited activity within patient's tolerance  Home Living                                          Prior Functioning/Environment              Frequency  Min 2X/week        Progress Toward Goals  OT Goals(current goals can now be found in the care plan section)  Progress towards OT goals: Progressing toward goals  Acute Rehab OT Goals Patient Stated Goal: go home OT Goal Formulation: With patient Time For Goal Achievement: 02/23/24 Potential to Achieve Goals: Fair  Plan      Co-evaluation                 AM-PAC OT 6 Clicks Daily Activity     Outcome Measure   Help from another person eating meals?: None Help from another person taking care of personal grooming?: A Little Help from another person toileting, which includes using toliet, bedpan, or urinal?: A Lot Help from another person bathing (including washing, rinsing, drying)?: A Lot Help from another person to put on and taking off regular upper body clothing?: A Little Help from another person to put on  and taking off regular lower body clothing?: A Lot 6 Click Score: 16    End of Session Equipment Utilized During Treatment: Gait belt;Rolling walker (2 wheels);Oxygen  OT Visit Diagnosis: Muscle weakness (generalized) (M62.81);Other abnormalities of gait and mobility (R26.89);Unsteadiness on feet (R26.81)   Activity Tolerance Patient tolerated treatment well   Patient Left in chair;with call bell/phone within reach;with chair alarm set   Nurse Communication Mobility status        Time: 3086-5784 OT Time Calculation (min): 31 min  Charges: OT General Charges $OT Visit: 1 Visit OT Treatments $Therapeutic Activity: 23-37 mins  Joory Gough, OTR/L  02/10/24, 12:37 PM   Leonard Raker 02/10/2024, 12:33 PM

## 2024-02-10 NOTE — Progress Notes (Signed)
 Physical Therapy Treatment Patient Details Name: Mary [name] MRN: [id number] DOB: 1935/04/01 Today's Date: 02/10/2024   History of Present Illness Cornie Birenbaum is an 88yoF admitted for AF c RVR, hypokalemia, & hypomagnesemia. PMH significant for PAF on Eliquis , HTN, CAD, asthma/COPD, chronic HFpEF, HLD, RA, sleep apnea, spinal stenosis. Recently returned home from STR 5 days ago.    PT Comments  Pt in bed on arrival, NSG at bedside, pt finishing dental hygiene. Pt agreeable to session, although she remains somewhat altered today, disoriented to place (thinks she's at home right now), similar to this morning. Pt moving a bit better with transfers at bedside, however upon trying to do some AMB training pt began have somewhat frequent buckling episodes, somewhat asterixis in presentation, no clear explanation, no prodrome. Pt able to AMB along bedside with 1-2 hand support and minGuard to intermittent minA for postural sway feedback, but these bucklings warrant up to maxA for recovery and safety. Pt assisted back to bed at end of session, all needs met.    If plan is discharge home, recommend the following: Assist for transportation;Help with stairs or ramp for entrance;Two people to help with walking and/or transfers;A lot of help with bathing/dressing/bathroom;Assistance with cooking/housework;Direct supervision/assist for medications management;Direct supervision/assist for financial management;Supervision due to cognitive status   Can travel by private vehicle     No  Equipment Recommendations  None recommended by PT    Recommendations for Other Services       Precautions / Restrictions Precautions Precautions: Fall Recall of Precautions/Restrictions: Impaired Restrictions Weight Bearing Restrictions Per Provider Order: No     Mobility  Bed Mobility Overal bed mobility: Needs Assistance Bed Mobility: Supine to Sit, Sit to Supine     Supine to sit: Supervision, HOB elevated Sit  to supine: Supervision        Transfers Overall transfer level: Needs assistance Equipment used: Rolling walker (2 wheels) Transfers: Sit to/from Stand Sit to Stand: Min assist           General transfer comment: postural awareness remains acutely off; also having asterixis-like buckling of legs while up takign steps, no warning.    Ambulation/Gait Ambulation/Gait assistance: Contact guard assist, Mod assist Gait Distance (Feet): 15 Feet Assistive device:  (2 hand support on be rails for support bar;)         General Gait Details: poor postural awareness when she begins to sway backward. Pt has several episodes of aserixis-like buckling of legs. Ultimately almost collapses over twice, requires modA for safe return to bed. Pt appologizing to Bolivar Bushman because she think she may have hurt him while buckling.   Stairs             Wheelchair Mobility     Tilt Bed    Modified Rankin (Stroke Patients Only)       Balance                                            Communication Communication Communication: No apparent difficulties  Cognition Arousal: Alert Behavior During Therapy: Anxious, Lability                           PT - Cognition Comments: mentation remains quite off, more hallucination earlie rin day; appropriate on my assessment but still feels to be at home.  Cueing    Exercises Other Exercises Other Exercises: STS from elevated EOB x5, to RW, supervision to minGuard assist. (prior to AMB activity)    General Comments General comments (skin integrity, edema, etc.): sp02 stable on 1L at 95% after activity, HR up to 73 at most      Pertinent Vitals/Pain Pain Assessment Pain Assessment: No/denies pain    Home Living                          Prior Function            PT Goals (current goals can now be found in the care plan section) Acute Rehab PT Goals Patient Stated Goal: return home PT  Goal Formulation: With patient Time For Goal Achievement: 02/13/24 Potential to Achieve Goals: Poor Progress towards PT goals: Not progressing toward goals - comment    Frequency    Min 2X/week      PT Plan      Co-evaluation              AM-PAC PT 6 Clicks Mobility   Outcome Measure  Help needed turning from your back to your side while in a flat bed without using bedrails?: A Little Help needed moving from lying on your back to sitting on the side of a flat bed without using bedrails?: A Little Help needed moving to and from a bed to a chair (including a wheelchair)?: A Lot Help needed standing up from a chair using your arms (e.g., wheelchair or bedside chair)?: A Lot Help needed to walk in hospital room?: Total Help needed climbing 3-5 steps with a railing? : Total 6 Click Score: 12    End of Session Equipment Utilized During Treatment: Oxygen Activity Tolerance: No increased pain;Treatment limited secondary to medical complications (Comment) (insidious buckling of legs without warning making AMB interventions quite precarious. Remains far off her mobility status just a few days prior.) Patient left: with call bell/phone within reach;with chair alarm set;in bed Nurse Communication: Mobility status PT Visit Diagnosis: Muscle weakness (generalized) (M62.81)     Time: 9811-9147 PT Time Calculation (min) (ACUTE ONLY): 18 min  Charges:    $Therapeutic Activity: 8-22 mins PT General Charges $$ ACUTE PT VISIT: 1 Visit                     3:13 PM, 02/10/24 Dawn Eth, PT, DPT Physical Therapist - [facility]  [phone] (ASCOM)    Laurenashley Viar C 02/10/2024, 3:10 PM

## 2024-02-11 DIAGNOSIS — I5033 Acute on chronic diastolic (congestive) heart failure: Secondary | ICD-10-CM | POA: Diagnosis not present

## 2024-02-11 LAB — GLUCOSE, CAPILLARY
Glucose-Capillary: 163 mg/dL — ABNORMAL HIGH (ref 70–99)
Glucose-Capillary: 191 mg/dL — ABNORMAL HIGH (ref 70–99)
Glucose-Capillary: 106 mg/dL — ABNORMAL HIGH (ref 70–99)
Glucose-Capillary: 144 mg/dL — ABNORMAL HIGH (ref 70–99)

## 2024-02-11 NOTE — Plan of Care (Signed)
  Problem: Education: Goal: Ability to describe self-care measures that may prevent or decrease complications (Diabetes Survival Skills Education) will improve Outcome: Progressing Goal: Individualized Educational Video(s) Outcome: Progressing   Problem: Coping: Goal: Ability to adjust to condition or change in health will improve Outcome: Progressing   Problem: Fluid Volume: Goal: Ability to maintain a balanced intake and output will improve Outcome: Progressing   Problem: Health Behavior/Discharge Planning: Goal: Ability to identify and utilize available resources and services will improve Outcome: Progressing Goal: Ability to manage health-related needs will improve Outcome: Progressing   Problem: Metabolic: Goal: Ability to maintain appropriate glucose levels will improve Outcome: Progressing   Problem: Nutritional: Goal: Maintenance of adequate nutrition will improve Outcome: Progressing Goal: Progress toward achieving an optimal weight will improve Outcome: Progressing   Problem: Skin Integrity: Goal: Risk for impaired skin integrity will decrease Outcome: Progressing   Problem: Tissue Perfusion: Goal: Adequacy of tissue perfusion will improve Outcome: Progressing   Problem: Health Behavior/Discharge Planning: Goal: Ability to manage health-related needs will improve Outcome: Progressing   Problem: Clinical Measurements: Goal: Ability to maintain clinical measurements within normal limits will improve Outcome: Progressing Goal: Will remain free from infection Outcome: Progressing Goal: Diagnostic test results will improve Outcome: Progressing Goal: Respiratory complications will improve Outcome: Progressing Goal: Cardiovascular complication will be avoided Outcome: Progressing   Problem: Activity: Goal: Risk for activity intolerance will decrease Outcome: Progressing   Problem: Nutrition: Goal: Adequate nutrition will be maintained Outcome:  Progressing   Problem: Coping: Goal: Level of anxiety will decrease Outcome: Progressing   Problem: Elimination: Goal: Will not experience complications related to bowel motility Outcome: Progressing Goal: Will not experience complications related to urinary retention Outcome: Progressing   Problem: Pain Managment: Goal: General experience of comfort will improve and/or be controlled Outcome: Progressing   Problem: Safety: Goal: Ability to remain free from injury will improve Outcome: Progressing   Problem: Skin Integrity: Goal: Risk for impaired skin integrity will decrease Outcome: Progressing   Problem: Education: Goal: Ability to demonstrate management of disease process will improve Outcome: Progressing Goal: Ability to verbalize understanding of medication therapies will improve Outcome: Progressing Goal: Individualized Educational Video(s) Outcome: Progressing   Problem: Activity: Goal: Capacity to carry out activities will improve Outcome: Progressing   Problem: Cardiac: Goal: Ability to achieve and maintain adequate cardiopulmonary perfusion will improve Outcome: Progressing   Problem: Education: Goal: Knowledge of disease or condition will improve Outcome: Progressing Goal: Understanding of medication regimen will improve Outcome: Progressing Goal: Individualized Educational Video(s) Outcome: Progressing   Problem: Activity: Goal: Ability to tolerate increased activity will improve Outcome: Progressing   Problem: Cardiac: Goal: Ability to achieve and maintain adequate cardiopulmonary perfusion will improve Outcome: Progressing   Problem: Health Behavior/Discharge Planning: Goal: Ability to safely manage health-related needs after discharge will improve Outcome: Progressing

## 2024-02-11 NOTE — Progress Notes (Signed)
 PROGRESS NOTE    Mary [name]  FMW:[id number] DOB: 1935-08-12 DOA: 01/29/2024 PCP: Alla Amis, MD  109A/109A-AA  LOS: 13 days   Brief hospital course:   Assessment & Plan: Mary [name] is a 89 y.o. female with medical history significant of coronary disease, hypertension, paroxysmal atrial fibrillation on anticoagulation with Eliquis , HFpEF, asthma/COPD as a result of secondhand smoking, hypothyroidism She presents to the emergency room with multiple complaints that include palpitations, associated shortness of breath and lower abdominal pain and spasms.  She felt her lower abdominal symptoms were likely attributed to urinary tract infection.  She denies any fever or chills.   In the ER, patient was noted to be in A-fib with RVR.  Heart rates in the 160s.  Patient required oxygen supplementation in the ED.  Chest x-ray did reveal some vascular congestion.   A-fib: with RVR.  S/p cardioversion on 02/07/24 in which pt converted to NSR.  D/c'ed digoxin  & diltiazem  as per cardio.  --cont oral amio with loading --cont Toprol  --cont Eliquis    Chronic diastolic CHF:  --cont oral lasix , Toprol  and spironolactone  --cont losartan    Pulmonary infiltrates: likely pneumonia. Completed abx course. Bronchodilators prn. Encourage incentive spirometry    Acute hypoxic respiratory failure:  Unable to wean from supplemental oxygen so far, currently need 2L O2. --Continue supplemental O2 to keep sats >=90%, wean as tolerated  Hypokalemia: --monitor and supplement PRN   History recurrent UTIs: urine cx growing multiple species. C/o dysuria. ? acute UTI. Completed abx course    Vaginal yeast infection: completed fluconazole  course x 2 doses. Continue w/ mycolog cream    Hypothyroidism: continue on levothyroxine     DM2: fair control, HbA1c 7.0.  --ACHS and SSI   HTN:  --cont oral lasix , Toprol  and spironolactone  --cont losartan   HLD: continue on statin     GAD: severity unknown.   --cont Buspar  and Paxil    Debility:  --PT/OT now rec SNF rehab  Moderate malnutrition  --supplements per dietician   DVT prophylaxis: On:Eliquis  Code Status: DNR  Family Communication:  Level of care: Med-Surg Dispo:   The patient is from: home Anticipated d/c is to: SNF rehab Anticipated d/c date is: whenever bed available   Subjective and Interval History:  Pt was oriented but talking non-sensically.     Objective: Vitals:   02/11/24 0355 02/11/24 0444 02/11/24 0755 02/11/24 1602  BP:  (!) 148/55 (!) 152/71 93/63  Pulse:  64 (!) 59 (!) 53  Resp:   18 18  Temp:  98.2 F (36.8 C) 99.7 F (37.6 C) 98.1 F (36.7 C)  TempSrc:   Oral Oral  SpO2:  (!) 89% 92% 96%  Weight: 59.2 kg     Height:        Intake/Output Summary (Last 24 hours) at 02/11/2024 1815 Last data filed at 02/11/2024 1422 Gross per 24 hour  Intake 240 ml  Output --  Net 240 ml   Filed Weights   02/09/24 0500 02/10/24 0429 02/11/24 0355  Weight: 61.2 kg 59.3 kg 59.2 kg    Examination:   Constitutional: NAD, alert, oriented to person and place HEENT: conjunctivae and lids normal, EOMI CV: No cyanosis.   RESP: normal respiratory effort, on 2L SKIN: warm, dry Neuro: II - XII grossly intact.   Psych: labile mood and affect.     Data Reviewed: I have personally reviewed labs and imaging studies  Time spent: 25 minutes  Ellouise Haber, MD [facility] If 7PM-7AM, please contact night-coverage  02/11/2024, 6:15 PM

## 2024-02-11 NOTE — TOC Progression Note (Addendum)
 Transition of Care [facility]) - Progression Note    Patient Details  Name: Mary [name] MRN: [id number] Date of Birth: May 29, 1935  [facility] CM/SW Contact  Joyous Gleghorn E Angelly Spearing, LCSW Phone Number: 02/11/2024, 10:49 AM  Clinical Narrative:    CSW checked with MD - per MD patient is medically ready for auth to be started. CSW attempted to call patient's daughter Sharlet to present bed offers (due to patient not being fully oriented per chart review). Left a VM requesting return call from [place].  11:58- Return call received from [place].  Sharlet requested a medical update - asked RN/MD to call Sharlet when available.  Presented bed offers, they want [facility]. Requested CMA start auth for [facility]. Updated [place] and [place] in Admissions at [facility].        Expected Discharge Plan and Services         Expected Discharge Date: 02/09/24                                     Social Determinants of Health (SDOH) Interventions SDOH Screenings   Food Insecurity: No Food Insecurity (01/30/2024)  Housing: Low Risk  (01/30/2024)  Transportation Needs: No Transportation Needs (01/30/2024)  Utilities: Not At Risk (01/30/2024)  Financial Resource Strain: Low Risk  (09/28/2023)   Received from [facility]  Social Connections: Moderately Isolated (01/30/2024)  Tobacco Use: Low Risk  (01/30/2024)    Readmission Risk Interventions     No data to display

## 2024-02-11 NOTE — Plan of Care (Signed)
  Problem: Education: Goal: Ability to describe self-care measures that may prevent or decrease complications (Diabetes Survival Skills Education) will improve Outcome: Progressing   Problem: Coping: Goal: Ability to adjust to condition or change in health will improve Outcome: Progressing   Problem: Fluid Volume: Goal: Ability to maintain a balanced intake and output will improve Outcome: Progressing   Problem: Health Behavior/Discharge Planning: Goal: Ability to identify and utilize available resources and services will improve Outcome: Progressing   Problem: Metabolic: Goal: Ability to maintain appropriate glucose levels will improve Outcome: Progressing   Problem: Nutritional: Goal: Maintenance of adequate nutrition will improve Outcome: Progressing   Problem: Skin Integrity: Goal: Risk for impaired skin integrity will decrease Outcome: Progressing   Problem: Tissue Perfusion: Goal: Adequacy of tissue perfusion will improve Outcome: Progressing   Problem: Health Behavior/Discharge Planning: Goal: Ability to manage health-related needs will improve Outcome: Progressing   Problem: Activity: Goal: Risk for activity intolerance will decrease Outcome: Progressing   Problem: Nutrition: Goal: Adequate nutrition will be maintained Outcome: Progressing   Problem: Coping: Goal: Level of anxiety will decrease Outcome: Progressing   Problem: Elimination: Goal: Will not experience complications related to bowel motility Outcome: Progressing   Problem: Pain Managment: Goal: General experience of comfort will improve and/or be controlled Outcome: Progressing

## 2024-02-12 DIAGNOSIS — I5033 Acute on chronic diastolic (congestive) heart failure: Secondary | ICD-10-CM | POA: Diagnosis not present

## 2024-02-12 LAB — GLUCOSE, CAPILLARY
Glucose-Capillary: 124 mg/dL — ABNORMAL HIGH (ref 70–99)
Glucose-Capillary: 129 mg/dL — ABNORMAL HIGH (ref 70–99)
Glucose-Capillary: 155 mg/dL — ABNORMAL HIGH (ref 70–99)
Glucose-Capillary: 107 mg/dL — ABNORMAL HIGH (ref 70–99)

## 2024-02-12 NOTE — TOC Progression Note (Signed)
 Transition of Care [facility]) - Progression Note    Patient Details  Name: Mary [name] MRN: [id number] Date of Birth: Feb 25, 1935  Transition of [facility]) CM/SW Contact  Evalie Hargraves E Malini Flemings, LCSW Phone Number: 02/12/2024, 11:32 AM  Clinical Narrative:    Unable to pull up patient in [place]. Checked with CMA. [name] is still pending.  CMA had to call [facility] yesterday and patient is spelled as [name] in [place].        Expected Discharge Plan and Services         Expected Discharge Date: 02/09/24                                     Social Determinants of Health (SDOH) Interventions SDOH Screenings   Food Insecurity: No Food Insecurity (01/30/2024)  Housing: Low Risk  (01/30/2024)  Transportation Needs: No Transportation Needs (01/30/2024)  Utilities: Not At Risk (01/30/2024)  Financial Resource Strain: Low Risk  (09/28/2023)   Received from [facility]  Social Connections: Moderately Isolated (01/30/2024)  Tobacco Use: Low Risk  (01/30/2024)    Readmission Risk Interventions     No data to display

## 2024-02-12 NOTE — Progress Notes (Signed)
 Reviewing vitals for am. SBP over 180 orders for hydralazine . Rechecked bp prior to administer BP doesn't meet parameters at this time.   02/12/24 0452 02/12/24 0610  Assess: MEWS Score  Temp 98.2 F (36.8 C) 98.2 F (36.8 C)  BP (!) 189/95 (!) 174/66  MAP (mmHg) 122 97  Pulse Rate (!) 58 (!) 57  Resp 18 16  SpO2 97 % 93 %  O2 Device  --  Nasal Cannula  O2 Flow Rate (L/min)  --  1.5 L/min

## 2024-02-12 NOTE — Plan of Care (Signed)
 Pt is alert and oriented to x 3. Pt is forgetful to why she is here. Up with 1 assist with unsteady gait. Uses walker and bedside commode. Meds whole with applesauce. Oxygen at 2Lnc. Last bm 6/21. Urinating without difficulty. Vitals stable.  Problem: Education: Goal: Ability to describe self-care measures that may prevent or decrease complications (Diabetes Survival Skills Education) will improve Outcome: Progressing   Problem: Coping: Goal: Ability to adjust to condition or change in health will improve Outcome: Progressing   Problem: Fluid Volume: Goal: Ability to maintain a balanced intake and output will improve Outcome: Progressing   Problem: Health Behavior/Discharge Planning: Goal: Ability to identify and utilize available resources and services will improve Outcome: Progressing Goal: Ability to manage health-related needs will improve Outcome: Progressing   Problem: Metabolic: Goal: Ability to maintain appropriate glucose levels will improve Outcome: Progressing   Problem: Nutritional: Goal: Maintenance of adequate nutrition will improve Outcome: Progressing Goal: Progress toward achieving an optimal weight will improve Outcome: Progressing   Problem: Skin Integrity: Goal: Risk for impaired skin integrity will decrease Outcome: Progressing   Problem: Tissue Perfusion: Goal: Adequacy of tissue perfusion will improve Outcome: Progressing   Problem: Health Behavior/Discharge Planning: Goal: Ability to manage health-related needs will improve Outcome: Progressing   Problem: Clinical Measurements: Goal: Ability to maintain clinical measurements within normal limits will improve Outcome: Progressing Goal: Will remain free from infection Outcome: Progressing Goal: Diagnostic test results will improve Outcome: Progressing Goal: Respiratory complications will improve Outcome: Progressing Goal: Cardiovascular complication will be avoided Outcome: Progressing    Problem: Activity: Goal: Risk for activity intolerance will decrease Outcome: Progressing   Problem: Nutrition: Goal: Adequate nutrition will be maintained Outcome: Progressing   Problem: Coping: Goal: Level of anxiety will decrease Outcome: Progressing   Problem: Elimination: Goal: Will not experience complications related to bowel motility Outcome: Progressing Goal: Will not experience complications related to urinary retention Outcome: Progressing   Problem: Pain Managment: Goal: General experience of comfort will improve and/or be controlled Outcome: Progressing   Problem: Safety: Goal: Ability to remain free from injury will improve Outcome: Progressing   Problem: Skin Integrity: Goal: Risk for impaired skin integrity will decrease Outcome: Progressing   Problem: Education: Goal: Ability to demonstrate management of disease process will improve Outcome: Progressing Goal: Ability to verbalize understanding of medication therapies will improve Outcome: Progressing   Problem: Activity: Goal: Capacity to carry out activities will improve Outcome: Progressing   Problem: Cardiac: Goal: Ability to achieve and maintain adequate cardiopulmonary perfusion will improve Outcome: Progressing   Problem: Education: Goal: Knowledge of disease or condition will improve Outcome: Progressing Goal: Understanding of medication regimen will improve Outcome: Progressing   Problem: Activity: Goal: Ability to tolerate increased activity will improve Outcome: Progressing   Problem: Cardiac: Goal: Ability to achieve and maintain adequate cardiopulmonary perfusion will improve Outcome: Progressing   Problem: Health Behavior/Discharge Planning: Goal: Ability to safely manage health-related needs after discharge will improve Outcome: Progressing

## 2024-02-12 NOTE — Progress Notes (Signed)
  PROGRESS NOTE    Mary [name]  FMW:[id number] DOB: 11-Jul-1935 DOA: 01/29/2024 PCP: Alla Amis, MD  109A/109A-AA  LOS: 14 days   Brief hospital course:   Assessment & Plan: Mary [name] is a 89 y.o. female with medical history significant of coronary disease, hypertension, paroxysmal atrial fibrillation on anticoagulation with Eliquis , HFpEF, asthma/COPD as a result of secondhand smoking, hypothyroidism She presents to the emergency room with multiple complaints that include palpitations, associated shortness of breath and lower abdominal pain and spasms.  She felt her lower abdominal symptoms were likely attributed to urinary tract infection.  She denies any fever or chills.   In the ER, patient was noted to be in A-fib with RVR.  Heart rates in the 160s.  Patient required oxygen supplementation in the ED.  Chest x-ray did reveal some vascular congestion.   A-fib: with RVR.  S/p cardioversion on 02/07/24 in which pt converted to NSR.  D/c'ed digoxin  & diltiazem  as per cardio.  --cont oral amio with loading --cont Toprol  --cont Eliquis    Chronic diastolic CHF:  --cont oral lasix , Toprol  and spironolactone  --cont losartan    Pulmonary infiltrates: likely pneumonia. Completed abx course. Bronchodilators prn. Encourage incentive spirometry    Acute hypoxic respiratory failure:  Unable to wean from supplemental oxygen so far, currently need 2L O2. --Continue supplemental O2 to keep sats >=90%, wean as tolerated  Hypokalemia: --monitor and supplement PRN   History recurrent UTIs: urine cx growing multiple species. C/o dysuria. ? acute UTI. Completed abx course    Vaginal yeast infection: completed fluconazole  course x 2 doses. Continue w/ mycolog cream    Hypothyroidism: continue on levothyroxine     DM2: fair control, HbA1c 7.0.  --ACHS and SSI   HTN:  --cont oral lasix , Toprol  and spironolactone  --cont losartan   HLD: continue on statin     GAD: severity unknown.   --cont Buspar  and Paxil    Debility:  --PT/OT now rec SNF rehab  Moderate malnutrition  --supplements per dietician   DVT prophylaxis: On:Eliquis  Code Status: DNR  Family Communication:  Level of care: Med-Surg Dispo:   The patient is from: home Anticipated d/c is to: SNF rehab Anticipated d/c date is: whenever bed available   Subjective and Interval History:  No new event today.   Objective: Vitals:   02/12/24 0500 02/12/24 0610 02/12/24 0737 02/12/24 1519  BP:  (!) 174/66 (!) 166/58 (!) 122/59  Pulse:  (!) 57 (!) 52 91  Resp:  16 20 18   Temp:  98.2 F (36.8 C) 98.7 F (37.1 C) 97.9 F (36.6 C)  TempSrc:   Oral   SpO2:  93% 95% 92%  Weight: 59.2 kg     Height:        Intake/Output Summary (Last 24 hours) at 02/12/2024 1620 Last data filed at 02/12/2024 1422 Gross per 24 hour  Intake 120 ml  Output --  Net 120 ml   Filed Weights   02/10/24 0429 02/11/24 0355 02/12/24 0500  Weight: 59.3 kg 59.2 kg 59.2 kg    Examination:   Constitutional: NAD CV: No cyanosis.   RESP: normal respiratory effort, on RA   Data Reviewed: I have personally reviewed labs and imaging studies  Time spent: 25 minutes  Ellouise Haber, MD [facility] If 7PM-7AM, please contact night-coverage 02/12/2024, 4:20 PM

## 2024-02-13 DIAGNOSIS — I5033 Acute on chronic diastolic (congestive) heart failure: Secondary | ICD-10-CM | POA: Diagnosis not present

## 2024-02-13 LAB — GLUCOSE, CAPILLARY: Glucose-Capillary: 135 mg/dL — ABNORMAL HIGH (ref 70–99)

## 2024-02-13 MED ORDER — ENSURE PLUS HIGH PROTEIN PO LIQD: 237.0000 mL | Freq: Three times a day (TID) | ORAL | Status: AC

## 2024-02-13 MED ORDER — AMIODARONE HCL 200 MG PO TABS: 200.0000 mg | ORAL_TABLET | Freq: Every day | ORAL | Status: AC

## 2024-02-13 MED ORDER — METOPROLOL SUCCINATE ER 25 MG PO TB24: 37.5000 mg | ORAL_TABLET | Freq: Every day | ORAL | Status: AC

## 2024-02-13 MED ORDER — METOPROLOL SUCCINATE ER 25 MG PO TB24: 37.5000 mg | ORAL_TABLET | Freq: Every day | ORAL | Status: DC

## 2024-02-13 MED ORDER — LOSARTAN POTASSIUM 100 MG PO TABS: 100.0000 mg | ORAL_TABLET | Freq: Every day | ORAL | Status: AC

## 2024-02-13 NOTE — TOC Progression Note (Addendum)
 Transition of Care [facility]) - Progression Note    Patient Details  Name: Mary [name] MRN: [id number] Date of Birth: [birth year]  Transition of Care [facility]) CM/SW Contact  Dalia GORMAN Fuse, RN Phone Number: 02/13/2024, 9:04 AM  Clinical Narrative:    [name] received for [facility] SNF approved 02/12/2024-02/15/2024 plan Auth ID #[id number]. TOC outreached to [employer] [phone] at [employer]. Awaiting call back from Brittney to very the patient can admit today.        Expected Discharge Plan and Services         Expected Discharge Date: 02/13/24                                     Social Determinants of Health (SDOH) Interventions SDOH Screenings   Food Insecurity: No Food Insecurity (01/30/2024)  Housing: Low Risk  (01/30/2024)  Transportation Needs: No Transportation Needs (01/30/2024)  Utilities: Not At Risk (01/30/2024)  Financial Resource Strain: Low Risk  (09/28/2023)   Received from [facility]  Social Connections: Moderately Isolated (01/30/2024)  Tobacco Use: Low Risk  (01/30/2024)    Readmission Risk Interventions     No data to display

## 2024-02-13 NOTE — TOC Transition Note (Addendum)
 Transition of Care [facility]) - Discharge Note   Patient Details  Name: Mary [name] MRN: [id number] Date of Birth: 06-Jun-1935  Transition [facility]) CM/SW Contact:  Dalia GORMAN Fuse, RN Phone Number: 02/13/2024, 10:23 AM   Clinical Narrative:    Patient is medically clear for discharge, discharge order is in EPIC. The DC Summary is pending. TOC will arrange for transportation via [facility].  Nurse to call report to [facility] [phone]  [phone]   Final next level of care: Skilled Nursing Facility Barriers to Discharge: No Barriers Identified   Patient Goals and CMS Choice            Discharge Placement              Patient chooses bed at: [facility] Patient to be transferred to facility by: [facility] Name of family member notified: Nairi Oswald [phone] Patient and family notified of of transfer: 02/13/24  Discharge Plan and Services Additional resources added to the After Visit Summary for                                       Social Drivers of Health (SDOH) Interventions SDOH Screenings   Food Insecurity: No Food Insecurity (01/30/2024)  Housing: Low Risk  (01/30/2024)  Transportation Needs: No Transportation Needs (01/30/2024)  Utilities: Not At Risk (01/30/2024)  Financial Resource Strain: Low Risk  (09/28/2023)   Received from [facility]  Social Connections: Moderately Isolated (01/30/2024)  Tobacco Use: Low Risk  (01/30/2024)     Readmission Risk Interventions     No data to display

## 2024-02-13 NOTE — Discharge Summary (Signed)
 Physician Discharge Summary   Mary [name]  female DOB: 02-23-1935  FMW:[id number]  PCP: Alla Amis, MD  Admit date: 01/29/2024 Discharge date: 02/13/2024  Admitted From: home Disposition:  SNF rehab CODE STATUS: DNR  Discharge Instructions     Diet - low sodium heart healthy   Complete by: As directed       Hospital Course:  For full details, please see H&P, progress notes, consult notes and ancillary notes.  Briefly,  Mary [name] is a 89 y.o. female with medical history significant of coronary disease, hypertension, paroxysmal atrial fibrillation on anticoagulation with Eliquis , HFpEF, asthma/COPD as a result of secondhand smoking, hypothyroidism who presented to the emergency room with multiple complaints that include palpitations, associated shortness of breath and lower abdominal pain and spasms.    In the ER, patient was noted to be in A-fib with RVR.  Heart rates in the 160s.  Patient required oxygen supplementation in the ED.  Chest x-ray did reveal some vascular congestion.   A-fib with RVR.  S/p cardioversion on 02/07/24 in which pt converted to NSR.  D/c'ed digoxin  & diltiazem  as per cardio.  Pt not taking coreg  PTA. --cont oral amio with loading --started on Toprol  and continue. --cont Eliquis    Acute on chronic diastolic CHF:  --Pt received IV lasix  40 BID from 6/9 to 6/17, then transitioned to oral Lasix  40 mg daily.  Aldactone  started on 6/17.   --started on Toprol  and continue. --cont losartan  but changed to nightly (dose increased from home 25 mg to 100 mg daily)   Pulmonary infiltrates: likely pneumonia. --received Levaquin  f/b 4 days of ceftriaxone  and doxy.   Acute hypoxic respiratory failure:  Unable to wean from supplemental oxygen so far, currently need 2L O2.   Hypokalemia: --monitored and supplemented PRN   History recurrent UTIs:  urine cx growing multiple species. C/o dysuria. ? acute UTI.  Received 4 days of ceftriaxone  (for PNA)     Vaginal yeast infection:  completed fluconazole  course x 2 doses.   Hypothyroidism:  continue on levothyroxine     DM2:  HbA1c 7.0.  --resume metformin  after discharge.   HTN:  --cont oral lasix  --started on Toprol  and aldactone  --cont losartan  but changed to nightly (dose increased from home 25 mg to 100 mg daily) --pt not taking irbesartan  or coreg  PTA   HLD:  continue on statin     GAD: severity unknown.  --cont Buspar  and Paxil    Debility:  --PT/OT rec SNF rehab   Moderate malnutrition  --supplements per dietician  Pt not taking plavix  PTA.   Discharge Diagnoses:  Principal Problem:   CHF (congestive heart failure) (HCC) Active Problems:   Hypothyroidism   Diabetes mellitus without complication (HCC)   Asthma   Coronary artery disease involving native coronary artery of native heart   Renal artery stenosis (HCC)   Hypertension, renovascular   A-fib (HCC)   Malnutrition of moderate degree   30 Day Unplanned Readmission Risk Score    Flowsheet Row ED to Hosp-Admission (Current) from 01/29/2024 in [facility] 1C MEDICAL TELEMETRY  30 Day Unplanned Readmission Risk Score (%) 27.91 Filed at 02/13/2024 0801    This score is the patient's risk of an unplanned readmission within 30 days of being discharged (0 -100%). The score is based on dignosis, age, lab data, medications, orders, and past utilization.   Low:  0-14.9   Medium: 15-21.9   High: 22-29.9   Extreme: 30 and above  Discharge Instructions:  Allergies as of 02/13/2024       Reactions   Cephalexin Hives   Nitrofurantoin    Other reaction(s): Other (See Comments) Other Reaction: measles-like lesions   Sulfa Antibiotics Hives   Atorvastatin Other (See Comments)   Muscle spasms/ muscle pain        Medication List     STOP taking these medications    carvedilol  25 MG tablet Commonly known as: COREG    carvedilol  6.25 MG tablet Commonly known as: COREG     clopidogrel  75 MG tablet Commonly known as: PLAVIX    digoxin  0.125 MG tablet Commonly known as: LANOXIN    Gemtesa  75 MG Tabs Generic drug: Vibegron    irbesartan  150 MG tablet Commonly known as: AVAPRO    potassium chloride  SA 20 MEQ tablet Commonly known as: KLOR-CON  M   traZODone  50 MG tablet Commonly known as: DESYREL        TAKE these medications    acetaminophen  500 MG tablet Commonly known as: TYLENOL  Take 500 mg by mouth every 6 (six) hours as needed for mild pain (pain score 1-3).   amiodarone  200 MG tablet Commonly known as: PACERONE  Take 1 tablet (200 mg total) by mouth 2 (two) times daily for 3 days, THEN 1 tablet (200 mg total) daily. Start taking on: February 09, 2024   amiodarone  200 MG tablet Commonly known as: Pacerone  Take 1 tablet (200 mg total) by mouth daily.   apixaban  5 MG Tabs tablet Commonly known as: ELIQUIS  Take 1 tablet (5 mg total) by mouth 2 (two) times daily.   azelastine 0.05 % ophthalmic solution Commonly known as: OPTIVAR Place 1 drop into both eyes daily as needed (allergies).   busPIRone  10 MG tablet Commonly known as: BUSPAR  Take 10 mg by mouth 2 (two) times daily.   cyclobenzaprine  5 MG tablet Commonly known as: FLEXERIL  Take 1 tablet (5 mg total) by mouth 3 (three) times daily as needed for muscle spasms.   dapagliflozin  propanediol 10 MG Tabs tablet Commonly known as: FARXIGA  Take 1 tablet (10 mg total) by mouth daily.   feeding supplement Liqd Take 237 mLs by mouth 3 (three) times daily between meals.   furosemide  40 MG tablet Commonly known as: LASIX  Take 1 tablet (40 mg total) by mouth daily.   levothyroxine  75 MCG tablet Commonly known as: SYNTHROID  Take 75 mcg by mouth daily.   losartan  100 MG tablet Commonly known as: COZAAR  Take 1 tablet (100 mg total) by mouth at bedtime. Increased from 25 mg. What changed:  medication strength how much to take when to take this additional instructions   metFORMIN   500 MG 24 hr tablet Commonly known as: GLUCOPHAGE -XR Take 500 mg by mouth daily. With dinner   metoprolol  succinate 25 MG 24 hr tablet Commonly known as: TOPROL -XL Take 1.5 tablets (37.5 mg total) by mouth daily.   multivitamin with minerals Tabs tablet Take 1 tablet by mouth daily.   pantoprazole  20 MG tablet Commonly known as: PROTONIX  Take 20 mg by mouth daily.   PARoxetine  40 MG tablet Commonly known as: PAXIL  Take 40 mg by mouth every evening.   rosuvastatin  5 MG tablet Commonly known as: CRESTOR  Take 1 tablet (5 mg total) by mouth daily.   spironolactone  25 MG tablet Commonly known as: ALDACTONE  Take 1 tablet (25 mg total) by mouth daily.   vitamin B-12 500 MCG tablet Commonly known as: CYANOCOBALAMIN  Take 500 mcg by mouth every other day.   Vitron-C 65-125 MG Tabs Generic drug:  Iron -Vitamin C  Take 1 tablet by mouth daily.               Durable Medical Equipment  (From admission, onward)           Start     Ordered   02/02/24 1557  For home use only DME oxygen  Once       Question Answer Comment  Length of Need 12 Months   Mode or (Route) Nasal cannula   Liters per Minute 2   Frequency Continuous (stationary and portable oxygen unit needed)   Oxygen conserving device Yes   Oxygen delivery system Gas      02/02/24 1556   02/01/24 1232  For home use only DME 4 wheeled rolling walker with seat  Once       Comments: Rollator w/ seat  Question:  Patient needs a walker to treat with the following condition  Answer:  Generalized weakness   02/01/24 1231             Contact information for follow-up providers     Hilarie Rocher, MD. Go in 1 week(s).   Specialty: Cardiology Why: Hospital Follow up: June 25th @ 2:30 PM. Please arrive 15 min early. Contact information: [address] [place] [address] [phone]         Alla Amis, MD Follow up in 1 week(s).   Specialty: Family Medicine Why: Hospital Follow up: June  26th @ 11:30 AM arrive 15 mins early. Contact information: [address] [place] [address] [phone]              Contact information for after-discharge care     Destination     [facility] .   Service: Skilled Nursing Contact information: [address] [place]  [address] [phone]                     Allergies  Allergen Reactions   Cephalexin Hives   Nitrofurantoin     Other reaction(s): Other (See Comments) Other Reaction: measles-like lesions   Sulfa Antibiotics Hives   Atorvastatin Other (See Comments)    Muscle spasms/ muscle pain     The results of significant diagnostics from this hospitalization (including imaging, microbiology, ancillary and laboratory) are listed below for reference.   Consultations:   Procedures/Studies: ECHO TEE Result Date: 02/07/2024    TRANSESOPHOGEAL ECHO REPORT   Patient Name:   Mary [name] Date of Exam: 02/07/2024 Medical Rec #:  [id number]     Height:       65.0 in Accession #:    [id number]    Weight:       133.6 lb Date of Birth:  1935/03/13     BSA:          1.666 m Patient Age:    88 years      BP:           130/65 mmHg Patient Gender: F             HR:           82 bpm. Exam Location:  [facility] Procedure: Transesophageal Echo and Color Doppler (Both Spectral and Color Flow            Doppler were utilized during procedure). Indications:     Atrial fibrillation  History:         Patient has prior history of Echocardiogram examinations, most  recent 12/08/2023. CHF, CAD, Arrythmias:Atrial Fibrillation,                  Signs/Symptoms:Shortness of Breath; Risk Factors:Hypertension,                  Diabetes and Dyslipidemia.  Sonographer:     Thea Norlander RCS Referring Phys:  [id number] [name] DECOSTE Diagnosing Phys: Keller Paterson PROCEDURE: After discussion of the risks and benefits of a TEE, an informed  consent was obtained from the patient. The transesophogeal probe was passed without difficulty through the esophogus of the patient. Imaged were obtained with the patient in a left lateral decubitus position. Local oropharyngeal anesthetic was provided with Cetacaine . Sedation performed by different physician. The patient was monitored while under deep sedation. Anesthestetic sedation was provided intravenously by Anesthesiology: 70mg  of Propofol . Image quality was excellent. The patient's vital signs; including heart rate, blood pressure, and oxygen saturation; remained stable throughout the procedure. The patient developed no complications during the procedure. A successful direct current cardioversion was performed at 200 joules with 1 attempt.  IMPRESSIONS  1. Left ventricular ejection fraction, by estimation, is 55 to 60%. The left ventricle has normal function.  2. Right ventricular systolic function is normal. The right ventricular size is normal.  3. Left atrial size was dilated. No left atrial/left atrial appendage thrombus was detected.  4. The mitral valve is normal in structure. Trivial mitral valve regurgitation.  5. The aortic valve is tricuspid. Aortic valve regurgitation is not visualized. Conclusion(s)/Recommendation(s): No LA/LAA thrombus identified. Successful cardioversion performed with restoration of normal sinus rhythm. FINDINGS  Left Ventricle: Left ventricular ejection fraction, by estimation, is 55 to 60%. The left ventricle has normal function. The left ventricular internal cavity size was normal in size. Right Ventricle: The right ventricular size is normal. No increase in right ventricular wall thickness. Right ventricular systolic function is normal. Left Atrium: Left atrial size was dilated. No left atrial/left atrial appendage thrombus was detected. Right Atrium: Right atrial size was normal in size. Pericardium: There is no evidence of pericardial effusion. Mitral Valve: The mitral  valve is normal in structure. Mild mitral annular calcification. Trivial mitral valve regurgitation. Tricuspid Valve: The tricuspid valve is normal in structure. Tricuspid valve regurgitation is mild. Aortic Valve: The aortic valve is tricuspid. Aortic valve regurgitation is not visualized. Pulmonic Valve: The pulmonic valve was normal in structure. Pulmonic valve regurgitation is not visualized. Aorta: The aortic root is normal in size and structure. IAS/Shunts: No atrial level shunt detected by color flow Doppler. Additional Comments: Spectral Doppler performed. Keller Paterson Electronically signed by Keller Paterson Signature Date/Time: 02/07/2024/1:40:59 PM    Final    DG Chest Port 1 View Result Date: 01/29/2024 CLINICAL DATA:  Shortness of breath EXAM: PORTABLE CHEST 1 VIEW COMPARISON:  01/08/2024 FINDINGS: Heart and mediastinal contours within normal limits. Patchy bilateral airspace opacities, right greater than left. No visible significant effusions. No acute bony abnormality. IMPRESSION: Patchy bilateral airspace opacities concerning for infection. Electronically Signed   By: Franky Crease M.D.   On: 01/29/2024 17:31      Labs: BNP (last 3 results) Recent Labs    12/07/23 1309 01/07/24 1450 01/29/24 1701  BNP 520.4* 283.6* 823.2*   Basic Metabolic Panel: Recent Labs  Lab 02/07/24 0333 02/07/24 0732 02/08/24 0346 02/09/24 0436 02/10/24 0540  NA  --  138 136  --  136  K  --  4.4 4.0  --  3.8  CL  --  90*  88*  --  89*  CO2  --  37* 34*  --  37*  GLUCOSE  --  125* 126*  --  127*  BUN  --  15 24*  --  20  CREATININE  --  0.61 0.84  --  0.61  CALCIUM   --  9.4 9.7  --  9.6  MG 2.1  --  2.1 2.0 2.0   Liver Function Tests: No results for input(s): AST, ALT, ALKPHOS, BILITOT, PROT, ALBUMIN in the last 168 hours. No results for input(s): LIPASE, AMYLASE in the last 168 hours. No results for input(s): AMMONIA in the last 168 hours. CBC: Recent Labs  Lab  02/08/24 0346  WBC 11.3*  HGB 12.4  HCT 40.0  MCV 100.0  PLT 245   Cardiac Enzymes: No results for input(s): CKTOTAL, CKMB, CKMBINDEX, TROPONINI in the last 168 hours. BNP: Invalid input(s): POCBNP CBG: Recent Labs  Lab 02/12/24 0732 02/12/24 1126 02/12/24 1642 02/12/24 2040 02/13/24 0738  GLUCAP 124* 129* 155* 107* 135*   D-Dimer No results for input(s): DDIMER in the last 72 hours. Hgb A1c No results for input(s): HGBA1C in the last 72 hours. Lipid Profile No results for input(s): CHOL, HDL, LDLCALC, TRIG, CHOLHDL, LDLDIRECT in the last 72 hours. Thyroid function studies No results for input(s): TSH, T4TOTAL, T3FREE, THYROIDAB in the last 72 hours.  Invalid input(s): FREET3 Anemia work up No results for input(s): VITAMINB12, FOLATE, FERRITIN, TIBC, IRON , RETICCTPCT in the last 72 hours. Urinalysis    Component Value Date/Time   COLORURINE AMBER (A) 01/29/2024 1701   APPEARANCEUR CLOUDY (A) 01/29/2024 1701   APPEARANCEUR Hazy (A) 05/09/2023 1300   LABSPEC 1.024 01/29/2024 1701   LABSPEC 1.005 12/29/2013 0119   PHURINE 5.0 01/29/2024 1701   GLUCOSEU NEGATIVE 01/29/2024 1701   GLUCOSEU Negative 12/29/2013 0119   HGBUR NEGATIVE 01/29/2024 1701   BILIRUBINUR NEGATIVE 01/29/2024 1701   BILIRUBINUR Negative 05/09/2023 1300   BILIRUBINUR Negative 12/29/2013 0119   KETONESUR NEGATIVE 01/29/2024 1701   PROTEINUR >=300 (A) 01/29/2024 1701   NITRITE NEGATIVE 01/29/2024 1701   LEUKOCYTESUR LARGE (A) 01/29/2024 1701   LEUKOCYTESUR 1+ 12/29/2013 0119   Sepsis Labs Recent Labs  Lab 02/08/24 0346  WBC 11.3*   Microbiology No results found for this or any previous visit (from the past 240 hours).   Total time spend on discharging this patient, including the last patient exam, discussing the hospital stay, instructions for ongoing care as it relates to all pertinent caregivers, as well as preparing the medical discharge  records, prescriptions, and/or referrals as applicable, is 35 minutes.    Ellouise Haber, MD  [facility] 02/13/2024, 10:58 AM

## 2024-02-13 NOTE — Plan of Care (Signed)
  Problem: Education: Goal: Ability to describe self-care measures that may prevent or decrease complications (Diabetes Survival Skills Education) will improve Outcome: Progressing Goal: Individualized Educational Video(s) Outcome: Progressing   Problem: Coping: Goal: Ability to adjust to condition or change in health will improve Outcome: Progressing   Problem: Fluid Volume: Goal: Ability to maintain a balanced intake and output will improve Outcome: Progressing   Problem: Health Behavior/Discharge Planning: Goal: Ability to identify and utilize available resources and services will improve Outcome: Progressing Goal: Ability to manage health-related needs will improve Outcome: Progressing   Problem: Metabolic: Goal: Ability to maintain appropriate glucose levels will improve Outcome: Progressing   Problem: Nutritional: Goal: Maintenance of adequate nutrition will improve Outcome: Progressing Goal: Progress toward achieving an optimal weight will improve Outcome: Progressing   Problem: Skin Integrity: Goal: Risk for impaired skin integrity will decrease Outcome: Progressing   Problem: Health Behavior/Discharge Planning: Goal: Ability to manage health-related needs will improve Outcome: Progressing   Problem: Clinical Measurements: Goal: Ability to maintain clinical measurements within normal limits will improve Outcome: Progressing Goal: Will remain free from infection Outcome: Progressing Goal: Diagnostic test results will improve Outcome: Progressing Goal: Respiratory complications will improve Outcome: Progressing Goal: Cardiovascular complication will be avoided Outcome: Progressing   Problem: Activity: Goal: Risk for activity intolerance will decrease Outcome: Progressing   Problem: Nutrition: Goal: Adequate nutrition will be maintained Outcome: Progressing   Problem: Pain Managment: Goal: General experience of comfort will improve and/or be  controlled Outcome: Progressing   Problem: Safety: Goal: Ability to remain free from injury will improve Outcome: Progressing   Problem: Skin Integrity: Goal: Risk for impaired skin integrity will decrease Outcome: Progressing

## 2024-02-15 NOTE — Progress Notes (Signed)
 Heart Failure Clinic Note  PCP: Alla Amis, MD   Subjective   Mary [name] is a 89 y.o. female with PMH of paroxsymal atrial fibrillation (on Eliquis ), chronic HFpEF, coronary artery disease (on CT), hypertension, hyperlipidemia, T2DM, right carotid stenosis s/p CEA, pulmonary hypertension, COPD/asthma who presents for a visit for evaluation of heart failure medications and titration after recent hospitalization for atrial fibrillation RVR and heart failure.  Since Ms. Lapinsky's hospitalization, she reports that she has been doing fairly well overall.  She is currently at a rehab facility and feels that she is doing well with rehab.  She denies any chest pain, shortness of breath, palpitations, dizziness, lightheadedness, syncope.  Also denies any lower extremity edema.  States that she did not have any swelling when she was in the hospital, her main symptoms were palpitations and shortness of breath related to atrial fibrillation.  Medication Titration Clinic History: Visit 1 - 02/15/2024: Decrease carvedilol  to 3.125 mg twice daily given bradycardia Visit 2: Visit 3: Visit 4:  History of Present Illness   Heart Failure Review of Symptoms:  Physical Exam  Weight gain: None Lower extremity edema: None Orthopnea: None PND: None Chest pain: None Palpitations: None Dizziness: None Near syncope or syncopal episodes: None Functional status: Blocks 1, Flights of Stairs 1 (functional status limited due to gait issues) NYHA Functional Class: II-III  @[id number] @  Objective   Vitals:   02/15/24 1448  BP: 138/62  BP Location: Left upper arm  Patient Position: Sitting  BP Cuff Size: Adult  Pulse: 54  Resp: 12  SpO2: 98%  Weight: 57.9 kg (127 lb 9.6 oz)  Height: 165.1 cm (5' 5)    Wt Readings from Last 3 Encounters:  02/15/24 57.9 kg (127 lb 9.6 oz)  01/05/24 62.8 kg (138 lb 6.4 oz)  10/20/23 71.8 kg (158 lb 6.4 oz)   Temp Readings from Last 3 Encounters:   02/02/21 37.3 C (99.2 F)  10/26/18 36.8 C (98.2 F) (Oral)  09/27/17 36.6 C (97.9 F)   BP Readings from Last 3 Encounters:  02/15/24 138/62  01/05/24 (!) 168/86  10/20/23 (!) 168/84   Pulse Readings from Last 3 Encounters:  02/15/24 54  01/05/24 63  10/20/23 60   Sitting vitals: Standing vitals: For patients >75 y.o. orthostasis vitals: Body mass index is 21.23 kg/m.   Physical Exam Constitutional:      Appearance: Normal appearance. She is normal weight.  HENT:     Head: Normocephalic and atraumatic.     Mouth/Throat:     Mouth: Mucous membranes are moist.     Pharynx: Oropharynx is clear.   Cardiovascular:     Rate and Rhythm: Regular rhythm. Bradycardia present.     Pulses: Normal pulses.     Heart sounds: Normal heart sounds.  Pulmonary:     Effort: Pulmonary effort is normal.     Breath sounds: Normal breath sounds. No wheezing or rales.   Musculoskeletal:     Right lower leg: No edema.     Left lower leg: No edema.   Skin:    General: Skin is warm and dry.   Neurological:     General: No focal deficit present.     Mental Status: She is alert and oriented to person, place, and time. Mental status is at baseline.   Psychiatric:        Mood and Affect: Mood normal.        Behavior: Behavior normal.  Current Outpatient Medications  Medication Sig Dispense Refill  . acetaminophen  (TYLENOL ) 500 MG tablet Take 1,000 mg by mouth nightly as needed      . AMIOdarone  (PACERONE ) 200 MG tablet Take 200 mg by mouth once daily    . apixaban  (ELIQUIS ) 5 mg tablet Take 5 mg by mouth 2 (two) times daily    . azelastine (OPTIVAR) 0.05 % ophthalmic solution Place 1 drop into both eyes 2 (two) times daily as needed       . blood glucose diagnostic test strip Check blood sugar once daily fasting. Dx E11.69 ONE TOUCH 100 each 1  . blood-glucose meter (ONETOUCH VERIO METER) Misc 1 each as directed Dx: E11.9 1 each 0  . busPIRone  (BUSPAR ) 10 MG tablet Take 1  tablet (10 mg total) by mouth 2 (two) times daily 180 tablet 1  . carvediloL  (COREG ) 3.125 MG tablet Take 1 tablet (3.125 mg total) by mouth 2 (two) times daily with meals 60 tablet 2  . cyanocobalamin  (VITAMIN B12) 1000 MCG tablet Take 1 tablet (1,000 mcg total) by mouth once daily (Patient taking differently: Take 500 mcg by mouth once daily) 100 tablet 1  . cyclobenzaprine  (FLEXERIL ) 5 MG tablet Take 5 mg by mouth 3 (three) times daily as needed    . dapagliflozin  propanediol (FARXIGA ) 10 mg tablet Take 10 mg by mouth once daily    . ferrous fumarate-vitamin C  (VITRON-C) 65 mg iron - 125 mg tablet Take 1 tablet by mouth once daily    . food supplemt, lactose-reduced (ENSURE PLUS HIGH PROTEIN) 0.08 gram-1.5 kcal/mL Liqd Take 237 mLs by mouth    . FUROsemide  (LASIX ) 40 MG tablet Take 40 mg by mouth once daily    . lancets 30 gauge Misc Use 1 each as directed (Check blood sugars once daily fasting. Dx E11.69) Check CBG's fasting once daily. Dx: E11.9 100 each 1  . levothyroxine  (SYNTHROID ) 75 MCG tablet Take 1 tablet (75 mcg total) by mouth once daily Take on an empty stomach with a glass of water  at least 30-60 minutes before breakfast. 90 tablet 0  . losartan  (COZAAR ) 100 MG tablet Take 100 mg by mouth once daily    . metFORMIN  (GLUCOPHAGE -XR) 500 MG XR tablet Take 1 tablet (500 mg total) by mouth daily with dinner 100 tablet 1  . multivitamin with minerals tablet Take 1 tablet by mouth once daily    . pantoprazole  (PROTONIX ) 20 MG DR tablet Take 1 tablet (20 mg total) by mouth once daily 90 tablet 3  . PARoxetine  (PAXIL ) 40 MG tablet Take 1 tablet (40 mg total) by mouth nightly 100 tablet 1  . rosuvastatin  (CRESTOR ) 5 MG tablet Take 1 tablet (5 mg total) by mouth once daily 100 tablet 1  . spironolactone  (ALDACTONE ) 25 MG tablet Take 25 mg by mouth once daily    . clopidogreL  (PLAVIX ) 75 mg tablet Take 75 mg by mouth once daily (Patient not taking: Reported on 02/15/2024)     No current  facility-administered medications for this visit.      Chemistry      Component Value Date/Time   NA 142 01/05/2024 1357   NA 140 02/09/2011 1417   K 3.3 (L) 01/05/2024 1357   K 4.3 02/09/2011 1417   CL 93 (L) 01/05/2024 1357   CL 101 02/09/2011 1417   CO2 42.4 (H) 01/05/2024 1357   BUN 22 01/05/2024 1357   CREATININE 1.0 01/05/2024 1357   CREATININE 0.7 02/09/2011 1417  GLUCOSE 146 (H) 01/05/2024 1357   GLUCOSE 90 02/09/2011 1417      Component Value Date/Time   CALCIUM  9.1 01/05/2024 1357   CALCIUM  9.7 02/09/2011 1417   ALKPHOS 68 09/22/2023 1045   ALKPHOS 90 02/09/2011 1417   AST 26 09/22/2023 1045   AST 26 02/09/2011 1417   ALT 10 09/22/2023 1045   ALT 13 (L) 02/09/2011 1417   TBILI 0.7 09/22/2023 1045   TBILI 0.6 02/09/2011 1417      Lab Results  Component Value Date   NA 142 01/05/2024   K 3.3 (L) 01/05/2024   CL 93 (L) 01/05/2024   CO2 42.4 (H) 01/05/2024   @LASTMG @ Lab Results  Component Value Date   GFR 55 (L) 01/05/2024   No results found for: MALBCREA Results   Most recent heart failure medications Beta-Blockers: Metoprolol  succinate 50 mg daily ACEi/ARB/ARNIs: Losartan  50 mg daily  MRAs: Spironolactone  25 mg daily SGLT2is: Farxiga  10 mg daily Isosorbide  dinitrate/Hydralazine :  Loop diuretics: Lasix  40 mg daily   Adherence:  Patient endorses using a pillbox.  Medication management: Currently at SNF who is managing her medications     For labs, patient would like to [facility].    Medication Cost/Assistance:   Patient has Medicare  Patient does not qualify for copay cards.  Patient does qualify for manufacturer patient assistance programs.   Social Drivers of Health with Concerns   Social Connections: Moderately Isolated (01/30/2024)   Received from [facility]   Social Connection and Isolation Panel   . In a typical week, how many times do you talk on the phone with family, friends, or neighbors?: More than three times a week   . How  often do you get together with friends or relatives?: More than three times a week   . How often do you attend church or religious services?: 1 to 4 times per year   . Do you belong to any clubs or organizations such as church groups, unions, fraternal or athletic groups, or school groups?: No   . How often do you attend meetings of the clubs or organizations you belong to?: Never   . Are you married, widowed, divorced, separated, never married, or living with a partner?: Widowed  Depression: At risk (09/28/2023)   PHQ-2   . PHQ-2 Score: 4       Health Maintenance  Flu shot:  Pneumococcal 23:  Tobacco:  Social History   Tobacco Use  Smoking Status Never  . Passive exposure: Past  Smokeless Tobacco Never    Assessment, Plan and Monitoring  Problem List: 1. Heart Failure Preserved Ejection Fraction - NYHA Class II-III; AHA / ACC Class C; Euvolemic - TTE 11/2023: EF 60-65%, no LVH, indeterminate diastolic function - No recent iron  deficiency screening Lab Results  Component Value Date   IRON  52 04/22/2021   TIBC 464.8 04/22/2021   FERRITIN 9 (L) 04/22/2021     (serum ferritin < 100ng/mL; 100-300 ng/mL; >300 ng/mL)   (TSAT <20%; >20%)  (Hgb <13 g/dL (men); Hgb <12 gdL (women); >13 g/dL: >87 g/dL) (Note if Hgb >84 g/dL no IV iron  indicated)  Assessment  Assessment & Plan  Patient's weights are decreasing". Weight: 57.9 kg (127 lb 9.6 oz). Weight on discharge 02/10/24 was 59.3 kg.  Patient's BP is decreasing".  Medication assessment:   - GDMT  - ACE/ARB/ARNI: Continue losartan  100 mg daily  - BB: Decrease carvedilol  to 3.125 mg twice daily  - MRA: Continue spironolactone   25 mg daily  - SGLT2i: Continue farxiga  10 mg daily - Isosorbide  dinitrate/Hydralazine : N/A - Ivabradine N/A Vericiguat N/A GLP1 agonist N/A  - Other:   - Diuretic: Continue Lasix  40 mg daily  - Estimated dry weight: 125-130 lbs  - Follow-up plan:  - Clinic/Phone: 3 weeks with Dr. Hilarie, 2 months with  me  - Labs: BMP ordered today  2. Hypertension - SBPs slightly elevated today but improved from hospitalization - Med changes as above  3. Normal renal function - Baseline Cr 0.6 - Today, Cr pending  4. Diabetes mellitus type II - Last HgbA1c was 7.0% on 01/31/2024 - SGLT2i status as above   Plan  Medication changes:  Decrease carvedilol  to 3.125 mg twice daily given bradycardia.   Patient advised to keep fluid intake at 2 Liters (half-gallon)(64 ounces) per day and salt intake to 2 grams (2000 mg) per day or 500 mg or less per meal. Instructed patient to stay active and weigh daily and record, and bring that weight diary to your next appointment.   Patient instructed to call clinic if experiences increased dizziness/lightheadedness, SBPs <90 or HR < 50.    Medication teaching - I provided education to the patient face to face regarding: - Medication mechanism of action - Dose and frequency of use - Potential side effects/adverse effects  Complex coordination of care Medications were reviewed and reconciled  Medications prescribed today Requested Prescriptions   Signed Prescriptions Disp Refills  . carvediloL  (COREG ) 3.125 MG tablet 60 tablet 2    Sig: Take 1 tablet (3.125 mg total) by mouth 2 (two) times daily with meals    Orders placed today Orders Placed This Encounter  Procedures  . Basic Metabolic Panel (BMP)    Release to patient:   Immediate  . ECG 12-lead   Clinic interventions and contributing factors to Level of Service  # Clinic interventions today:Symptom/Chief Complaint, Vital Signs, Review of allergies/falls, and Review/Reconciliation of Medications  # Contributory Factors to Level of Service: None  Patient verbalized understanding and all questions were answered. Patient is agreeable with the plan outlined above. No barriers to learning were identified.   Follow-up  Patient scheduled for follow up with Dr. Pendyal in 3 weeks. Follow up with me  in 2 months.   Return in about 2 months (around 04/16/2024) for me. Follow up with Dr. Pendyal as scheduled..  Future Appointments     Date/Time Provider Department Center Visit Type   02/16/2024 11:30 AM Minor, Jonette Penning, PA [facility] C RETURN VISIT   03/05/2024 11:30 AM Hilarie Rocher, MD [facility] C FOLLOW UP   04/03/2024 10:15 AM [facility] C LAB   04/10/2024 1:00 PM Alla Amis, MD [facility] OFFICE VISIT   04/11/2024 2:00 PM [place], Caralyn [place], PA [facility] C FOLLOW UP       Time spent directly with patient: 30 minutes  This note has been created using automated tools and reviewed for accuracy by CARALYN REBEKAH HUDSON.   Signed: CARALYN REBEKAH HUDSON, PA  [facility] Cardiology Heart Failure APP 02/15/2024 4:03 PM

## 2024-05-18 ENCOUNTER — Encounter: Payer: Self-pay | Admitting: Urology

## 2024-11-13 ENCOUNTER — Ambulatory Visit: Admitting: Urology

## 2024-11-15 ENCOUNTER — Ambulatory Visit: Admitting: Urology
